# Patient Record
Sex: Female | Born: 1960
Health system: Southern US, Community
[De-identification: ages and names within clinical notes are randomized; demographics above are authoritative.]

## PROBLEM LIST (undated history)

## (undated) DIAGNOSIS — E663 Overweight: Secondary | ICD-10-CM

## (undated) DIAGNOSIS — M25519 Pain in unspecified shoulder: Secondary | ICD-10-CM

## (undated) DIAGNOSIS — M1990 Unspecified osteoarthritis, unspecified site: Secondary | ICD-10-CM

## (undated) DIAGNOSIS — K648 Other hemorrhoids: Secondary | ICD-10-CM

## (undated) DIAGNOSIS — R0602 Shortness of breath: Secondary | ICD-10-CM

## (undated) DIAGNOSIS — G4730 Sleep apnea, unspecified: Secondary | ICD-10-CM

## (undated) DIAGNOSIS — I2510 Atherosclerotic heart disease of native coronary artery without angina pectoris: Secondary | ICD-10-CM

## (undated) DIAGNOSIS — IMO0002 Reserved for concepts with insufficient information to code with codable children: Secondary | ICD-10-CM

## (undated) DIAGNOSIS — I10 Essential (primary) hypertension: Secondary | ICD-10-CM

## (undated) DIAGNOSIS — K219 Gastro-esophageal reflux disease without esophagitis: Secondary | ICD-10-CM

## (undated) DIAGNOSIS — Z7901 Long term (current) use of anticoagulants: Secondary | ICD-10-CM

## (undated) DIAGNOSIS — I509 Heart failure, unspecified: Secondary | ICD-10-CM

## (undated) DIAGNOSIS — R911 Solitary pulmonary nodule: Secondary | ICD-10-CM

## (undated) DIAGNOSIS — I219 Acute myocardial infarction, unspecified: Secondary | ICD-10-CM

## (undated) DIAGNOSIS — E785 Hyperlipidemia, unspecified: Secondary | ICD-10-CM

## (undated) DIAGNOSIS — R9430 Abnormal result of cardiovascular function study, unspecified: Secondary | ICD-10-CM

## (undated) DIAGNOSIS — C50919 Malignant neoplasm of unspecified site of unspecified female breast: Secondary | ICD-10-CM

## (undated) DIAGNOSIS — G4733 Obstructive sleep apnea (adult) (pediatric): Secondary | ICD-10-CM

## (undated) DIAGNOSIS — K469 Unspecified abdominal hernia without obstruction or gangrene: Secondary | ICD-10-CM

## (undated) DIAGNOSIS — I2699 Other pulmonary embolism without acute cor pulmonale: Secondary | ICD-10-CM

## (undated) DIAGNOSIS — Z86010 Personal history of colonic polyps: Secondary | ICD-10-CM

## (undated) DIAGNOSIS — K5730 Diverticulosis of large intestine without perforation or abscess without bleeding: Secondary | ICD-10-CM

## (undated) HISTORY — DX: Solitary pulmonary nodule: R91.1

## (undated) HISTORY — DX: Shortness of breath: R06.02

## (undated) HISTORY — DX: Diverticulosis of large intestine without perforation or abscess without bleeding: K57.30

## (undated) HISTORY — DX: Abnormal result of cardiovascular function study, unspecified: R94.30

## (undated) HISTORY — DX: Malignant neoplasm of unspecified site of unspecified female breast: C50.919

## (undated) HISTORY — DX: Pain in unspecified shoulder: M25.519

## (undated) HISTORY — DX: Overweight: E66.3

## (undated) HISTORY — PX: COLOSTOMY: SHX63

## (undated) HISTORY — DX: Long term (current) use of anticoagulants: Z79.01

## (undated) HISTORY — DX: Reserved for concepts with insufficient information to code with codable children: IMO0002

## (undated) HISTORY — DX: Unspecified abdominal hernia without obstruction or gangrene: K46.9

## (undated) HISTORY — PX: KNEE ARTHROSCOPY: SUR90

## (undated) HISTORY — DX: Obstructive sleep apnea (adult) (pediatric): G47.33

## (undated) HISTORY — DX: Unspecified osteoarthritis, unspecified site: M19.90

## (undated) HISTORY — PX: BREAST LUMPECTOMY: SHX2

## (undated) HISTORY — DX: Acute myocardial infarction, unspecified: I21.9

## (undated) HISTORY — DX: Other pulmonary embolism without acute cor pulmonale: I26.99

## (undated) HISTORY — DX: Hyperlipidemia, unspecified: E78.5

## (undated) HISTORY — DX: Sleep apnea, unspecified: G47.30

## (undated) HISTORY — DX: Atherosclerotic heart disease of native coronary artery without angina pectoris: I25.10

## (undated) HISTORY — DX: Essential (primary) hypertension: I10

## (undated) HISTORY — DX: Personal history of colonic polyps: Z86.010

## (undated) HISTORY — DX: Other hemorrhoids: K64.8

---

## 1998-07-21 ENCOUNTER — Emergency Department (HOSPITAL_COMMUNITY): Admission: EM | Admit: 1998-07-21 | Discharge: 1998-07-21 | Payer: Self-pay | Admitting: Emergency Medicine

## 2002-07-21 HISTORY — PX: ABDOMINAL HYSTERECTOMY: SHX81

## 2002-08-09 ENCOUNTER — Encounter (INDEPENDENT_AMBULATORY_CARE_PROVIDER_SITE_OTHER): Payer: Self-pay | Admitting: Specialist

## 2002-08-10 ENCOUNTER — Inpatient Hospital Stay (HOSPITAL_COMMUNITY): Admission: RE | Admit: 2002-08-10 | Discharge: 2002-08-11 | Payer: Self-pay | Admitting: *Deleted

## 2007-01-24 ENCOUNTER — Emergency Department (HOSPITAL_COMMUNITY): Admission: EM | Admit: 2007-01-24 | Discharge: 2007-01-25 | Payer: Self-pay | Admitting: Emergency Medicine

## 2008-10-27 ENCOUNTER — Emergency Department (HOSPITAL_COMMUNITY): Admission: EM | Admit: 2008-10-27 | Discharge: 2008-10-28 | Payer: Self-pay | Admitting: Emergency Medicine

## 2009-06-04 ENCOUNTER — Encounter (INDEPENDENT_AMBULATORY_CARE_PROVIDER_SITE_OTHER): Payer: Self-pay | Admitting: Dermatology

## 2009-06-04 ENCOUNTER — Ambulatory Visit: Payer: Self-pay | Admitting: Internal Medicine

## 2009-06-04 DIAGNOSIS — K439 Ventral hernia without obstruction or gangrene: Secondary | ICD-10-CM | POA: Insufficient documentation

## 2009-06-04 DIAGNOSIS — B354 Tinea corporis: Secondary | ICD-10-CM | POA: Insufficient documentation

## 2009-06-04 DIAGNOSIS — I10 Essential (primary) hypertension: Secondary | ICD-10-CM

## 2009-06-04 DIAGNOSIS — R635 Abnormal weight gain: Secondary | ICD-10-CM | POA: Insufficient documentation

## 2009-06-19 ENCOUNTER — Ambulatory Visit: Payer: Self-pay | Admitting: Internal Medicine

## 2009-06-20 LAB — CONVERTED CEMR LAB
ALT: 15 units/L (ref 0–35)
AST: 16 units/L (ref 0–37)
Albumin: 4.1 g/dL (ref 3.5–5.2)
Alkaline Phosphatase: 60 units/L (ref 39–117)
BUN: 17 mg/dL (ref 6–23)
CO2: 25 meq/L (ref 19–32)
Calcium: 9.5 mg/dL (ref 8.4–10.5)
Chloride: 104 meq/L (ref 96–112)
Creatinine, Ser: 0.61 mg/dL (ref 0.40–1.20)
Glucose, Bld: 94 mg/dL (ref 70–99)
HCT: 41.8 % (ref 36.0–46.0)
Hemoglobin: 13.3 g/dL (ref 12.0–15.0)
MCHC: 31.8 g/dL (ref 30.0–36.0)
MCV: 86.2 fL (ref >=78.0)
Platelets: 274 10*3/uL (ref 150–400)
Potassium: 4.8 meq/L (ref 3.5–5.3)
RBC: 4.85 M/uL (ref 3.87–5.11)
RDW: 15.1 % (ref 11.5–15.5)
Sodium: 142 meq/L (ref 135–145)
TSH: 1.183 microintl units/mL (ref 0.350–4.5)
Total Bilirubin: 0.3 mg/dL (ref 0.3–1.2)
Total Protein: 7.6 g/dL (ref 6.0–8.3)
WBC: 6.6 10*3/uL (ref 4.0–10.5)

## 2009-06-29 DIAGNOSIS — G4733 Obstructive sleep apnea (adult) (pediatric): Secondary | ICD-10-CM

## 2009-07-17 ENCOUNTER — Ambulatory Visit (HOSPITAL_BASED_OUTPATIENT_CLINIC_OR_DEPARTMENT_OTHER): Admission: RE | Admit: 2009-07-17 | Discharge: 2009-07-17 | Payer: Self-pay | Admitting: Dermatology

## 2009-07-17 ENCOUNTER — Encounter (INDEPENDENT_AMBULATORY_CARE_PROVIDER_SITE_OTHER): Payer: Self-pay | Admitting: Dermatology

## 2009-07-21 ENCOUNTER — Ambulatory Visit: Payer: Self-pay | Admitting: Internal Medicine

## 2009-09-19 ENCOUNTER — Ambulatory Visit: Payer: Self-pay | Admitting: Infectious Diseases

## 2009-09-21 ENCOUNTER — Telehealth: Payer: Self-pay | Admitting: Licensed Clinical Social Worker

## 2009-09-21 ENCOUNTER — Encounter: Payer: Self-pay | Admitting: Licensed Clinical Social Worker

## 2009-09-24 ENCOUNTER — Encounter (INDEPENDENT_AMBULATORY_CARE_PROVIDER_SITE_OTHER): Payer: Self-pay | Admitting: Dermatology

## 2009-09-25 ENCOUNTER — Encounter (INDEPENDENT_AMBULATORY_CARE_PROVIDER_SITE_OTHER): Payer: Self-pay | Admitting: Dermatology

## 2009-09-25 ENCOUNTER — Encounter: Payer: Self-pay | Admitting: Internal Medicine

## 2009-09-25 ENCOUNTER — Ambulatory Visit (HOSPITAL_COMMUNITY): Admission: RE | Admit: 2009-09-25 | Discharge: 2009-09-25 | Payer: Self-pay | Admitting: Internal Medicine

## 2009-09-25 ENCOUNTER — Ambulatory Visit: Payer: Self-pay | Admitting: Internal Medicine

## 2009-10-02 ENCOUNTER — Ambulatory Visit (HOSPITAL_COMMUNITY): Admission: RE | Admit: 2009-10-02 | Discharge: 2009-10-02 | Payer: Self-pay | Admitting: Internal Medicine

## 2009-10-02 LAB — HM MAMMOGRAPHY: HM Mammogram: NEGATIVE

## 2009-10-03 ENCOUNTER — Ambulatory Visit: Payer: Self-pay | Admitting: Internal Medicine

## 2009-10-03 LAB — CONVERTED CEMR LAB
ALT: 16 units/L (ref 0–35)
AST: 14 units/L (ref 0–37)
Albumin: 4.3 g/dL (ref 3.5–5.2)
Alkaline Phosphatase: 65 units/L (ref 39–117)
BUN: 11 mg/dL (ref 6–23)
CO2: 30 meq/L (ref 19–32)
Calcium: 9.3 mg/dL (ref 8.4–10.5)
Chloride: 100 meq/L (ref 96–112)
Cholesterol: 212 mg/dL — ABNORMAL HIGH (ref 0–200)
Creatinine, Ser: 0.7 mg/dL (ref 0.40–1.20)
Glucose, Bld: 97 mg/dL (ref 70–99)
HDL: 46 mg/dL (ref >39)
LDL Cholesterol: 144 mg/dL — ABNORMAL HIGH (ref 0–99)
Potassium: 4.4 meq/L (ref 3.5–5.3)
Sodium: 138 meq/L (ref 135–145)
Total Bilirubin: 0.4 mg/dL (ref 0.3–1.2)
Total CHOL/HDL Ratio: 4.6
Total Protein: 7.8 g/dL (ref 6.0–8.3)
Triglycerides: 111 mg/dL (ref <150)
VLDL: 22 mg/dL (ref 0–40)

## 2009-10-04 ENCOUNTER — Telehealth: Payer: Self-pay | Admitting: *Deleted

## 2009-10-05 ENCOUNTER — Telehealth (INDEPENDENT_AMBULATORY_CARE_PROVIDER_SITE_OTHER): Payer: Self-pay | Admitting: Dermatology

## 2009-10-16 ENCOUNTER — Encounter: Payer: Self-pay | Admitting: Internal Medicine

## 2009-10-19 ENCOUNTER — Telehealth (INDEPENDENT_AMBULATORY_CARE_PROVIDER_SITE_OTHER): Payer: Self-pay | Admitting: Dermatology

## 2009-10-22 ENCOUNTER — Encounter (INDEPENDENT_AMBULATORY_CARE_PROVIDER_SITE_OTHER): Payer: Self-pay | Admitting: *Deleted

## 2009-10-22 ENCOUNTER — Ambulatory Visit: Payer: Self-pay | Admitting: Gastroenterology

## 2009-10-23 ENCOUNTER — Encounter: Payer: Self-pay | Admitting: Internal Medicine

## 2009-10-31 ENCOUNTER — Ambulatory Visit: Payer: Self-pay | Admitting: Gastroenterology

## 2009-10-31 LAB — HM COLONOSCOPY

## 2009-11-28 ENCOUNTER — Ambulatory Visit: Payer: Self-pay | Admitting: Internal Medicine

## 2009-12-19 DIAGNOSIS — R911 Solitary pulmonary nodule: Secondary | ICD-10-CM

## 2009-12-19 HISTORY — DX: Solitary pulmonary nodule: R91.1

## 2010-01-15 ENCOUNTER — Telehealth: Payer: Self-pay | Admitting: *Deleted

## 2010-01-16 ENCOUNTER — Ambulatory Visit: Payer: Self-pay | Admitting: Diagnostic Radiology

## 2010-01-16 ENCOUNTER — Encounter: Payer: Self-pay | Admitting: Emergency Medicine

## 2010-01-16 ENCOUNTER — Inpatient Hospital Stay (HOSPITAL_COMMUNITY): Admission: AD | Admit: 2010-01-16 | Discharge: 2010-01-17 | Payer: Self-pay | Admitting: Internal Medicine

## 2010-01-16 ENCOUNTER — Encounter: Payer: Self-pay | Admitting: Internal Medicine

## 2010-01-16 DIAGNOSIS — I2699 Other pulmonary embolism without acute cor pulmonale: Secondary | ICD-10-CM

## 2010-01-16 HISTORY — DX: Other pulmonary embolism without acute cor pulmonale: I26.99

## 2010-01-17 ENCOUNTER — Ambulatory Visit: Payer: Self-pay | Admitting: Internal Medicine

## 2010-01-22 ENCOUNTER — Ambulatory Visit: Payer: Self-pay | Admitting: Internal Medicine

## 2010-01-22 LAB — CONVERTED CEMR LAB: INR: 2

## 2010-01-28 ENCOUNTER — Ambulatory Visit: Payer: Self-pay | Admitting: Internal Medicine

## 2010-01-28 LAB — CONVERTED CEMR LAB: INR: 2.5

## 2010-02-04 ENCOUNTER — Ambulatory Visit: Payer: Self-pay | Admitting: Internal Medicine

## 2010-02-04 ENCOUNTER — Encounter: Payer: Self-pay | Admitting: Internal Medicine

## 2010-02-04 ENCOUNTER — Ambulatory Visit (HOSPITAL_COMMUNITY): Admission: RE | Admit: 2010-02-04 | Discharge: 2010-02-04 | Payer: Self-pay | Admitting: Internal Medicine

## 2010-02-04 DIAGNOSIS — R0602 Shortness of breath: Secondary | ICD-10-CM

## 2010-02-04 DIAGNOSIS — M25519 Pain in unspecified shoulder: Secondary | ICD-10-CM

## 2010-02-06 ENCOUNTER — Encounter: Payer: Self-pay | Admitting: Cardiology

## 2010-02-07 ENCOUNTER — Ambulatory Visit: Payer: Self-pay | Admitting: Cardiology

## 2010-02-11 ENCOUNTER — Ambulatory Visit: Payer: Self-pay | Admitting: Family Medicine

## 2010-02-11 DIAGNOSIS — M75100 Unspecified rotator cuff tear or rupture of unspecified shoulder, not specified as traumatic: Secondary | ICD-10-CM | POA: Insufficient documentation

## 2010-02-15 ENCOUNTER — Telehealth: Payer: Self-pay | Admitting: Internal Medicine

## 2010-02-18 ENCOUNTER — Ambulatory Visit: Payer: Self-pay | Admitting: Internal Medicine

## 2010-02-18 LAB — CONVERTED CEMR LAB: INR: 3.8

## 2010-02-22 ENCOUNTER — Ambulatory Visit: Payer: Self-pay | Admitting: Family Medicine

## 2010-02-22 DIAGNOSIS — M722 Plantar fascial fibromatosis: Secondary | ICD-10-CM | POA: Insufficient documentation

## 2010-02-25 ENCOUNTER — Ambulatory Visit: Payer: Self-pay | Admitting: Internal Medicine

## 2010-02-25 DIAGNOSIS — R252 Cramp and spasm: Secondary | ICD-10-CM

## 2010-02-25 LAB — CONVERTED CEMR LAB
BUN: 28 mg/dL — ABNORMAL HIGH (ref 6–23)
CO2: 26 meq/L (ref 19–32)
Calcium Ionized: 1.27 mmol/L (ref 1.12–1.32)
Chloride: 101 meq/L (ref 96–112)
Creatinine, Ser: 0.99 mg/dL (ref 0.40–1.20)
HCT: 42.9 % (ref 36.0–46.0)
Hemoglobin: 13.6 g/dL (ref 12.0–15.0)
Platelets: 329 10*3/uL (ref 150–400)
Potassium: 4.7 meq/L (ref 3.5–5.3)
TSH: 0.767 microintl units/mL (ref 0.350–4.5)
WBC: 7.1 10*3/uL (ref 4.0–10.5)

## 2010-03-04 ENCOUNTER — Ambulatory Visit: Payer: Self-pay | Admitting: Internal Medicine

## 2010-03-04 LAB — CONVERTED CEMR LAB

## 2010-03-07 ENCOUNTER — Ambulatory Visit: Payer: Self-pay | Admitting: Internal Medicine

## 2010-03-07 ENCOUNTER — Telehealth: Payer: Self-pay | Admitting: *Deleted

## 2010-03-07 DIAGNOSIS — H5310 Unspecified subjective visual disturbances: Secondary | ICD-10-CM | POA: Insufficient documentation

## 2010-03-18 ENCOUNTER — Ambulatory Visit: Payer: Self-pay | Admitting: Internal Medicine

## 2010-03-18 LAB — CONVERTED CEMR LAB: INR: 4.2

## 2010-03-28 ENCOUNTER — Ambulatory Visit: Payer: Self-pay | Admitting: Internal Medicine

## 2010-03-28 LAB — CONVERTED CEMR LAB

## 2010-03-29 ENCOUNTER — Encounter: Payer: Self-pay | Admitting: Cardiology

## 2010-04-01 ENCOUNTER — Ambulatory Visit: Payer: Self-pay | Admitting: Family Medicine

## 2010-04-01 ENCOUNTER — Ambulatory Visit: Payer: Self-pay | Admitting: Cardiology

## 2010-04-04 ENCOUNTER — Telehealth: Payer: Self-pay | Admitting: Cardiology

## 2010-04-15 ENCOUNTER — Ambulatory Visit: Payer: Self-pay | Admitting: Internal Medicine

## 2010-04-15 ENCOUNTER — Encounter
Admission: RE | Admit: 2010-04-15 | Discharge: 2010-06-24 | Payer: Self-pay | Source: Home / Self Care | Admitting: Internal Medicine

## 2010-04-15 LAB — CONVERTED CEMR LAB: INR: 2.4

## 2010-05-06 ENCOUNTER — Ambulatory Visit: Payer: Self-pay | Admitting: Internal Medicine

## 2010-05-27 ENCOUNTER — Ambulatory Visit: Payer: Self-pay | Admitting: Internal Medicine

## 2010-05-27 LAB — CONVERTED CEMR LAB: INR: 2.4

## 2010-06-25 ENCOUNTER — Ambulatory Visit: Payer: Self-pay | Admitting: Internal Medicine

## 2010-06-25 DIAGNOSIS — K089 Disorder of teeth and supporting structures, unspecified: Secondary | ICD-10-CM | POA: Insufficient documentation

## 2010-06-25 LAB — CONVERTED CEMR LAB: INR: 2.3

## 2010-07-05 ENCOUNTER — Ambulatory Visit: Payer: Self-pay | Admitting: Internal Medicine

## 2010-07-08 ENCOUNTER — Encounter: Payer: Self-pay | Admitting: Internal Medicine

## 2010-07-22 ENCOUNTER — Ambulatory Visit: Payer: Self-pay

## 2010-07-29 ENCOUNTER — Ambulatory Visit: Admission: RE | Admit: 2010-07-29 | Discharge: 2010-07-29 | Payer: Self-pay | Source: Home / Self Care

## 2010-08-09 DIAGNOSIS — I2699 Other pulmonary embolism without acute cor pulmonale: Secondary | ICD-10-CM | POA: Insufficient documentation

## 2010-08-09 DIAGNOSIS — Z7901 Long term (current) use of anticoagulants: Secondary | ICD-10-CM | POA: Insufficient documentation

## 2010-08-13 ENCOUNTER — Encounter: Payer: Self-pay | Admitting: Internal Medicine

## 2010-08-13 ENCOUNTER — Telehealth: Payer: Self-pay | Admitting: *Deleted

## 2010-08-20 NOTE — Assessment & Plan Note (Signed)
Summary: ACUTE/PATEL/DENTAL REFERRAL/CH   Vital Signs:  Patient profile:   50 year old female Height:      65.5 inches (166.37 cm) Weight:      269.0 pounds (122.27 kg) BMI:     44.24 Temp:     97.1 degrees F (36.17 degrees C) oral Pulse rate:   80 / minute BP sitting:   127 / 76  (left arm)  Vitals Entered By: Stanton Kidney Ditzler RN (June 25, 2010 11:46 AM) Is Patient Diabetic? No Pain Assessment Patient in pain? yes     Location: teeth Intensity: 5-9 Type: aches Onset of pain  past 2 weeks Nutritional Status BMI of > 30 = obese Nutritional Status Detail appetite good  Have you ever been in a relationship where you felt threatened, hurt or afraid?denies   Does patient need assistance? Functional Status Self care Ambulation Normal Comments Dental pain past 2 weeks - needs referral dental clinic.   Primary Care Provider:  Lyn Hollingshead   History of Present Illness: 50 yo woman with PMH of PE, OSA, HTN presents for dental pain.  She has a toothache on upper molar x 2 weeks which has been getting worst.  She tried gargling with salt and peroxide but it is not helping.   Denies any fever, N/V.  She had a slight headache this morning but now she is ok.  She does not need any pain medication at this moment and she knows that she cannot take a lot of pain med because she is on Coumadin for her PE.   Depression History:      The patient denies a depressed mood most of the day and a diminished interest in her usual daily activities.         Preventive Screening-Counseling & Management  Alcohol-Tobacco     Alcohol drinks/day: 0     Smoking Status: quit     Year Quit: 10 YEARS AGO  Caffeine-Diet-Exercise     Does Patient Exercise: no     Type of exercise: will start water aerobics  Allergies: 1)  ! Asa  Review of Systems       toothache  Physical Exam  General:  alert, well-developed, well-nourished, and well-hydrated.   Mouth:  right upper molar # 3 with tenderness,  no erythema or drainage seen.   Right lower tooth # 28- cavity Lungs:  normal respiratory effort, no intercostal retractions, no accessory muscle use, normal breath sounds, no crackles, and no wheezes.   Heart:  normal rate, regular rhythm, no murmur, no gallop, no rub, and no JVD.   Abdomen:  soft, non-tender, normal bowel sounds, no distention, no masses, and no guarding.   Pulses:  R and L carotid,radial,femoral,dorsalis pedis and posterior tibial pulses are full and equal bilaterally Extremities:  No clubbing, cyanosis, edema, or deformity noted with normal full range of motion of all joints.   Neurologic:  alert & oriented X3, cranial nerves II-XII intact, strength normal in all extremities, and sensation intact to light touch.     Impression & Recommendations:  Problem # 1:  DENTAL PAIN (ICD-525.9) Toothache on #3 of right upper molar and #28 on right lower jaw.  Will refer patient to dentist for further evaluation and treatment. I also gave her a prescription of Amoxicillin 500mg  three times a day x 3 days and instructed her to take the abx if her pain get worse and if she cannot be seen by the dentist in 1-2 weeks.  I  also told her to call and inform me to let me know if she's taking the Amoxicillin because she would need to get her INR check sooner than her january appointment with Dr. Alexandria Lodge since the interaction between Coumadin and Amoxicillin include: "may increase INR and risk of bleeding."  Her INR on 05/2010 was 1.9, being followed by Dr. Alexandria Lodge.  Orders: Dental Referral (Dentist)  Problem # 2:  ESSENTIAL HYPERTENSION, BENIGN (ICD-401.1) Well-controlled BP 127/76.  There is confusion to which dosage  of Amlodipine patient is taking.  She will start taking Amlodipine 5mg  1 tablet once daily and then follow up with Dr. Anselm Jungling in 10-14 days to recheck her BP.    Her updated medication list for this problem includes:    Lisinopril-hydrochlorothiazide 20-12.5 Mg Tabs  (Lisinopril-hydrochlorothiazide) .Marland Kitchen... Take 1 tablet by mouth every morning    Amlodipine Besylate 5 Mg Tabs (Amlodipine besylate) .Marland Kitchen... Take 1 tablet by mouth once daily  Complete Medication List: 1)  Lisinopril-hydrochlorothiazide 20-12.5 Mg Tabs (Lisinopril-hydrochlorothiazide) .... Take 1 tablet by mouth every morning 2)  Warfarin Sodium 10 Mg Tabs (Warfarin sodium) .... Tablet strength: 10mg  take as directed 3)  Amlodipine Besylate 5 Mg Tabs (Amlodipine besylate) .... Take 1 tablet by mouth once daily 4)  Amoxicillin 500 Mg Tabs (Amoxicillin) .Marland Kitchen.. 1 tablet by mouth three times a day x 3 days  Patient Instructions: 1)  Please follow up with Dr. Anselm Jungling in 10-14 days 2)  Please bring your medication bottle next office visit 3)  Take Lisinopril/HCTZ 1 tablet by mouth once daily  4)  Take Amlodipine 5mg  1 tablet by mouth once daily  5)  If you decide to take Amoxicillin, please come back in for an INR check because it can increase risk of bleeding when you are taking it with Coumadin. Prescriptions: AMOXICILLIN 500 MG TABS (AMOXICILLIN) 1 tablet by mouth three times a day x 3 days  #9 x 0   Entered and Authorized by:   Rosana Berger MD   Signed by:   Rosana Berger MD on 06/25/2010   Method used:   Print then Give to Patient   RxID:   [id number] AMLODIPINE BESYLATE 5 MG TABS (AMLODIPINE BESYLATE) take 1 tablet by mouth once daily  #30 x 11   Entered and Authorized by:   Rosana Berger MD   Signed by:   Rosana Berger MD on 06/25/2010   Method used:   Electronically to        [facility] (retail)       [address]       [place], [place]  [id number]       Ph: [phone]       Fax: [phone]   RxID:   [id number]    Orders Added: 1)  Dental Referral [Dentist] 2)  Est. Patient Level III [42683]     Prevention & Chronic Care Immunizations   Influenza vaccine: Not documented   Influenza vaccine deferral: Refused  (06/19/2009)    Tetanus booster:  07/22/2003: historical from records given    Pneumococcal vaccine: Not documented  Colorectal Screening   Hemoccult: Not documented   Hemoccult action/deferral: Deferred  (06/19/2009)    Colonoscopy: DONE  (10/31/2009)   Colonoscopy action/deferral: GI referral  (09/19/2009)   Colonoscopy due: 10/2019  Other Screening   Pap smear: Not documented   Pap smear action/deferral: Not indicated S/P hysterectomy  (06/19/2009)    Mammogram: ASSESSMENT: Negative - BI-RADS 1^MM DIGITAL SCREENING  (10/02/2009)  Mammogram action/deferral: Ordered  (09/19/2009)   Smoking status: quit  (06/25/2010)  Lipids   Total Cholesterol: 212  (10/03/2009)   LDL: 144  (10/03/2009)   LDL Direct: Not documented   HDL: 46  (10/03/2009)   Triglycerides: 111  (10/03/2009)  Hypertension   Last Blood Pressure: 127 / 76  (06/25/2010)   Serum creatinine: 0.99  (02/25/2010)   Serum potassium 4.7  (02/25/2010)  Self-Management Support :   Personal Goals (by the next clinic visit) :      Personal blood pressure goal: 140/90  (10/03/2009)   Patient will work on the following items until the next clinic visit to reach self-care goals:     Medications and monitoring: take my medicines every day, check my blood pressure, bring all of my medications to every visit  (06/25/2010)     Eating: eat more vegetables, use fresh or frozen vegetables, eat baked foods instead of fried foods, eat fruit for snacks and desserts, limit or avoid alcohol  (06/25/2010)     Activity: take a 30 minute walk every day  (06/25/2010)     Other: watching greens since on Coumadin- joined [employer]  (02/25/2010)    Hypertension self-management support: Written self-care plan, Education handout, Resources for patients handout  (06/25/2010)   Hypertension self-care plan printed.   Hypertension education handout printed      Resource handout printed.

## 2010-08-20 NOTE — Assessment & Plan Note (Signed)
Summary: COU/CH  Anticoagulant Therapy PCP: Aris Lot MD [facility] Attending: Margarito Liner MD Indication 1: Pulmonary  embolus Indication 2: Encounter for therapeutic drug monitoring  V58.83 Start date: 01/16/2010 Duration: 6 months  Patient Assessment Reviewed by: Chancy Milroy PharmD  January 28, 2010 Medication review: verified warfarin dosage & schedule,verified previous prescription medications, verified doses & any changes, verified new medications, reviewed OTC medications, reviewed OTC health products-vitamins supplements etc Complications: none Dietary changes: none   Health status changes: none   Lifestyle changes: none   Recent/future hospitalizations: none   Recent/future procedures: none   Recent/future dental: none Patient Assessment Part 2:  Have you MISSED ANY DOSES or CHANGED TABLETS?  No missed Warfarin doses or changed tablets.  Have you had any BRUISING or BLEEDING ( nose or gum bleeds,blood in urine or stool)?  No reported bruising or bleeding in nose, gums, urine, stool.  Have you STARTED or STOPPED any MEDICATIONS, including OTC meds,herbals or supplements?  No other medications or herbal supplements were started or stopped.  Have you CHANGED your DIET, especially green vegetables,or ALCOHOL intake?  No changes in diet or alcohol intake.  Have you had any ILLNESSES or HOSPITALIZATIONS?  No reported illnesses or hospitalizations  Have you had any signs of CLOTTING?(chest discomfort,dizziness,shortness of breath,arms tingling,slurred speech,swelling or redness in leg)    No chest discomfort, dizziness, shortness of breath, tingling in arm, slurred speech, swelling, or redness in leg.     Treatment  Target INR: 2.0-3.0 INR: 2.5  Date: 01/28/2010 Regimen In:  60mg /6 days INR reflects regimen in: 2.5  New  Tablet strength: : 10mg  Regimen Out:     Sunday: 1 Tablet     Monday: 1/2 Tablet     Tuesday: 1 Tablet     Wednesday: 1 Tablet     Thursday: 1/2  Tablet      Friday: 1 Tablet     Saturday: 1 Tablet Total Weekly: 60.0mg /week mg  Next INR Due: 02/04/2010 Adjusted by: Barbera Setters. Alexandria Lodge III PharmD CACP   Return to anticoagulation clinic:  02/04/2010 Time of next visit: 1015    Allergies: 1)  ! Jonne Ply

## 2010-08-20 NOTE — Assessment & Plan Note (Signed)
Summary: COU/CH  Anticoagulant Therapy PCP: Lyn Hollingshead [facility] Attending: Coralee Pesa MD, Joen Laura  Indication 1: Pulmonary  embolus Indication 2: Encounter for therapeutic drug monitoring  V58.83 Start date: 01/16/2010 Duration: 6 months  Patient Assessment Reviewed by: Chancy Milroy PharmD  February 18, 2010 Medication review: verified warfarin dosage & schedule,verified previous prescription medications, verified doses & any changes, verified new medications, reviewed OTC medications, reviewed OTC health products-vitamins supplements etc Complications: none Dietary changes: none   Health status changes: none   Lifestyle changes: none   Recent/future hospitalizations: none   Recent/future procedures: none   Recent/future dental: none Patient Assessment Part 2:  Have you MISSED ANY DOSES or CHANGED TABLETS?  No missed Warfarin doses or changed tablets.  Have you had any BRUISING or BLEEDING ( nose or gum bleeds,blood in urine or stool)?  No reported bruising or bleeding in nose, gums, urine, stool.  Have you STARTED or STOPPED any MEDICATIONS, including OTC meds,herbals or supplements?  No other medications or herbal supplements were started or stopped.  Have you CHANGED your DIET, especially green vegetables,or ALCOHOL intake?  No changes in diet or alcohol intake.  Have you had any ILLNESSES or HOSPITALIZATIONS?  No reported illnesses or hospitalizations  Have you had any signs of CLOTTING?(chest discomfort,dizziness,shortness of breath,arms tingling,slurred speech,swelling or redness in leg)    No chest discomfort, dizziness, shortness of breath, tingling in arm, slurred speech, swelling, or redness in leg.     Treatment  Target INR: 2.0-3.0 INR: 3.8  Date: 02/18/2010 Regimen In:  55.0mg /week INR reflects regimen in: 3.8  New  Tablet strength: : 10mg  Regimen Out:     Sunday: 1/2 Tablet     Monday: 1 Tablet     Tuesday: 1/2 Tablet     Wednesday: 1 Tablet     Thursday: 1/2  Tablet      Friday: 1 Tablet     Saturday: 1/2 Tablet Total Weekly: 50.0mg /week mg  Next INR Due: 03/04/2010 Adjusted by: Barbera Setters. Alexandria Lodge III PharmD CACP   Return to anticoagulation clinic:  02/25/2010 Time of next visit: 1045    Allergies: 1)  ! Jonne Ply

## 2010-08-20 NOTE — Assessment & Plan Note (Signed)
Summary: COU/CH  Anticoagulant Therapy PCP: Lyn Hollingshead [facility] Attending: Coralee Pesa MD, Levada Schilling Indication 1: Pulmonary  embolus Indication 2: Encounter for therapeutic drug monitoring  V58.83 Start date: 01/16/2010 Duration: 6 months  Patient Assessment Reviewed by: Chancy Milroy PharmD  March 04, 2010 Medication review: verified warfarin dosage & schedule,verified previous prescription medications, verified doses & any changes, verified new medications, reviewed OTC medications, reviewed OTC health products-vitamins supplements etc Complications: none Dietary changes: none   Health status changes: none   Lifestyle changes: none   Recent/future hospitalizations: none   Recent/future procedures: none   Recent/future dental: none Patient Assessment Part 2:  Have you MISSED ANY DOSES or CHANGED TABLETS?  No missed Warfarin doses or changed tablets.  Have you had any BRUISING or BLEEDING ( nose or gum bleeds,blood in urine or stool)?  No reported bruising or bleeding in nose, gums, urine, stool.  Have you STARTED or STOPPED any MEDICATIONS, including OTC meds,herbals or supplements?  No other medications or herbal supplements were started or stopped.  Have you CHANGED your DIET, especially green vegetables,or ALCOHOL intake?  No changes in diet or alcohol intake.  Have you had any ILLNESSES or HOSPITALIZATIONS?  No reported illnesses or hospitalizations  Have you had any signs of CLOTTING?(chest discomfort,dizziness,shortness of breath,arms tingling,slurred speech,swelling or redness in leg)    No chest discomfort, dizziness, shortness of breath, tingling in arm, slurred speech, swelling, or redness in leg.     Treatment  Target INR: 2.0-3.0 INR: 2.9  Date: 03/04/2010 Regimen In:  50.0mg /week INR reflects regimen in: 2.9  New  Tablet strength: : 10mg  Regimen Out:     Sunday: 1/2 Tablet     Monday: 1 Tablet     Tuesday: 1/2 Tablet     Wednesday: 1/2 Tablet     Thursday: 1  Tablet      Friday: 1/2 Tablet     Saturday: 1/2 Tablet Total Weekly: 45.0mg /week mg  Next INR Due: 03/18/2010 Adjusted by: Barbera Setters. Alexandria Lodge III PharmD CACP   Return to anticoagulation clinic:  03/18/2010 Time of next visit: 1000    Allergies: 1)  ! Jonne Ply

## 2010-08-20 NOTE — Assessment & Plan Note (Signed)
Summary: extreme makeover: lifestyle class/dmr   Allergies: 1)  ! Asa  Patient attended a 1 hour [employer]: Lifestyle meeting today. The meeting included information about: cooking- a food demo, healthy eating (portion control, lower and healthy fat,  high fiber and more fruits and vegetables), weight loss tips, and how to start a walking program and how to use a pedometer to track walking progress .   Please ask patient what they learned at their next visit.   Thank you for the referral.
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
## 2010-08-20 NOTE — Assessment & Plan Note (Signed)
Summary: COU/CH  Anticoagulant Therapy Managed by: Barbera Setters. Janie Morning  PharmD CACP PCP: Lyn Hollingshead [facility] Attending: Darl Pikes, Beth Indication 1: Pulmonary  embolus Indication 2: Encounter for therapeutic drug monitoring  V58.83 Start date: 01/16/2010 Duration: 6 months  Patient Assessment Reviewed by: Chancy Milroy PharmD  May 27, 2010 Medication review: verified warfarin dosage & schedule,verified previous prescription medications, verified doses & any changes, verified new medications, reviewed OTC medications, reviewed OTC health products-vitamins supplements etc Complications: none Dietary changes: none   Health status changes: none   Lifestyle changes: none   Recent/future hospitalizations: none   Recent/future procedures: none   Recent/future dental: none Patient Assessment Part 2:  Have you MISSED ANY DOSES or CHANGED TABLETS?  No missed Warfarin doses or changed tablets.  Have you had any BRUISING or BLEEDING ( nose or gum bleeds,blood in urine or stool)?  No reported bruising or bleeding in nose, gums, urine, stool.  Have you STARTED or STOPPED any MEDICATIONS, including OTC meds,herbals or supplements?  No other medications or herbal supplements were started or stopped.  Have you CHANGED your DIET, especially green vegetables,or ALCOHOL intake?  No changes in diet or alcohol intake.  Have you had any ILLNESSES or HOSPITALIZATIONS?  No reported illnesses or hospitalizations  Have you had any signs of CLOTTING?(chest discomfort,dizziness,shortness of breath,arms tingling,slurred speech,swelling or redness in leg)    No chest discomfort, dizziness, shortness of breath, tingling in arm, slurred speech, swelling, or redness in leg.     Treatment  Target INR: 2.0-3.0 INR: 2.4  Date: 05/27/2010 Regimen In:  50.0mg /week INR reflects regimen in: 2.4  New  Tablet strength: : 10mg  Regimen Out:     Sunday: 1/2 Tablet     Monday: 1 Tablet     Tuesday: 1/2  Tablet     Wednesday: 1 Tablet     Thursday: 1/2 Tablet      Friday: 1 Tablet     Saturday: 1/2 Tablet Total Weekly: 50.0mg /week mg  Next INR Due: 06/24/2010 Adjusted by: Barbera Setters. Alexandria Lodge III PharmD CACP   Return to anticoagulation clinic:  06/24/2010 Time of next visit: 1115    Allergies: 1)  ! Jonne Ply

## 2010-08-20 NOTE — Assessment & Plan Note (Signed)
Summary: np6/dx:chest perssure/SOB/post TEE/lg   Visit Type:  Initial Consult Primary Provider:  Lyn Hollingshead  CC:  shortness of breath.  History of Present Illness: The patient is referred for evaluation of shortness of breath.  She was admitted to the hospital with a pulmonary embolus on January 16, 2010.  She's been treated with Coumadin.  She is significantly overweight.  Since that time she's had continued shortness of breath.  Some of it is with exertion.  She is referred for further cardiac evaluation.  There is no proven history of coronary disease.  She does have obstructive sleep apnea.  There is a history of hypertension.  Patient also has some right-sided sharp chest pain that is intermittent.  Preventive Screening-Counseling & Management  Alcohol-Tobacco     Smoking Status: quit  Caffeine-Diet-Exercise     Does Patient Exercise: no      Drug Use:  no.    Current Medications (verified): 1)  Lisinopril-Hydrochlorothiazide 20-25 Mg Tabs (Lisinopril-Hydrochlorothiazide) .... Take 1 Tablet By Mouth Once A Day 2)  Warfarin Sodium 10 Mg Tabs (Warfarin Sodium) .... Tablet Strength: 10mg  Take As Directed 3)  Amlodipine Besylate 10 Mg Tabs (Amlodipine Besylate) .... Take 1 Tablet By Mouth Once A Day  Allergies (verified): 1)  ! Jonne Ply  Past History:  Family History: Last updated: 09/19/2009 Mother: DM Dad: DM Older sister: DM No family members have had stroke, MI, cancer per her report.   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  Not married; single. Through 10th grade in high school. Some reading skills. Can write. Going to [employer] now to work on reading skills.  Tobacco Use - Former.  Alcohol Use - no Regular Exercise - no Drug Use - no  Past Medical History: Shoulder pain Shortness of breath Pulmonary embolus   01/16/2010..treat with  coumadin OSA Abdominal hernia Hypertension Overweight Lung nodule  LUL...by CT  12/2009...needs one year F/U Coumadin Rx.  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  Not married; single. Through 10th grade in high school. Some reading skills. Can write. Going to [employer] now to work on reading skills.  Tobacco Use - Former.  Alcohol Use - no Regular Exercise - no Drug Use - no Drug Use:  no  Review of Systems       Patient denies fever, chills, headache, sweats, rash, change in vision, change in hearing, cough, nausea vomiting, urinary symptoms.  All other systems are reviewed and are negative.  Vital Signs:  Patient profile:   50 year old female Height:      67 inches Weight:      283 pounds BMI:     44.48 Pulse rate:   85 / minute BP sitting:   142 / 78  (left arm) Cuff size:   large  Vitals Entered By: Hardin Negus, RMA (February 07, 2010 2:56 PM)  Physical Exam  General:  Patient is markedly overweight. Head:  head is atraumatic. Eyes:  no xanthelasma. Neck:  no jugular venous distention. Chest Wall:  no chest wall tenderness. Lungs:  lungs are clear.  Respiratory effort is nonlabored. Heart:  cardiac exam reveals an S1-S2.  No clicks or significant murmurs. Abdomen:  abdomen is obese. Msk:  no musculoskeletal deformities. Extremities:  no peripheral  edema. Skin:  no skin rashes. Psych:  patient is oriented to person time and place.  Affect is normal.   Impression & Recommendations:  Problem # 1:  COUMADIN THERAPY (ICD-V58.61) The patient is on Coumadin and of course this needs to be continued.  Problem # 2:  SHOULDER PAIN, RIGHT (ICD-719.41) At this point there is no proof that the shoulder pain is ischemic in origin.  We will consider exercise testing at a later date.  Problem # 3:  PE (ICD-415.19)  Her updated medication list for this problem  includes:    Warfarin Sodium 10 Mg Tabs (Warfarin sodium) .Marland Kitchen... Tablet strength: 10mg  take as directed The etiology of the pulmonary embolus is not clear.  She will need to remain on Coumadin indefinitely.  Problem # 4:  WEIGHT GAIN (ICD-783.1) patient's weight certainly plays a role with her shortness of breath.  Problem # 5:  ESSENTIAL HYPERTENSION, BENIGN (ICD-401.1) The patient is on multiple medications for her hypertension.  Blood pressure is reasonably controlled today.  Problem # 6:  * LUNG NODULE O. nodule was noted on her CT scan.  There was recommendation for followup in one year.  Problem # 7:  * VIGOROUS LV FUNCTION I have personally reviewed the images of the patient's echo that was done when she was in the hospital.  A right heart was not dilated.  There is moderate left ventricular hypertrophy and there was vigorous or hyperdynamic LV function.  There was a small intracavitary gradient in the left ventricle.  I wonder if some of her symptoms may be obstructive in origin.  There was no documented left ventricular outflow tract obstruction.  Her resting heart rate is mildly increased.  I will let a beta blocker to her medicines to see if this helps her symptomatically.  When I see her back for followup we will consider further testing.  Other Orders: EKG w/ Interpretation (93000)  Patient Instructions: 1)  Start Metoprolol ER 50mg  1/2 tab daily for 1 week then increase to 1 tab daily. 2)  Follow up in 6 weeks Prescriptions: METOPROLOL SUCCINATE 50 MG XR24H-TAB (METOPROLOL SUCCINATE) Take 1/2 tab daily for 1 week then take 1 tab daily  #30 x 6   Entered by:   Meredith Staggers, RN   Authorized by:   Talitha Givens, MD, [facility]   Signed by:   Meredith Staggers, RN on 02/07/2010   Method used:   Telephoned to ...       [facility] Department (retail)       [address] [place], [place]  [address]       Ph: [phone]       Fax: [phone]   RxID:    [id number]

## 2010-08-20 NOTE — Assessment & Plan Note (Signed)
Summary: 9AM COU APPT/CH  Anticoagulant Therapy PCP: Lyn Hollingshead [facility] Attending: Margarito Liner MD Indication 1: Pulmonary  embolus Indication 2: Encounter for therapeutic drug monitoring  V58.83 Start date: 01/16/2010 Duration: 6 months  Patient Assessment Reviewed by: Chancy Milroy PharmD  March 28, 2010 Medication review: verified warfarin dosage & schedule,verified previous prescription medications, verified doses & any changes, verified new medications, reviewed OTC medications, reviewed OTC health products-vitamins supplements etc Complications: none Dietary changes: none   Health status changes: none   Lifestyle changes: none   Recent/future hospitalizations: none   Recent/future procedures: none   Recent/future dental: none Patient Assessment Part 2:  Have you MISSED ANY DOSES or CHANGED TABLETS?  No missed Warfarin doses or changed tablets.  Have you had any BRUISING or BLEEDING ( nose or gum bleeds,blood in urine or stool)?  No reported bruising or bleeding in nose, gums, urine, stool.  Have you STARTED or STOPPED any MEDICATIONS, including OTC meds,herbals or supplements?  No other medications or herbal supplements were started or stopped.  Have you CHANGED your DIET, especially green vegetables,or ALCOHOL intake?  No changes in diet or alcohol intake.  Have you had any ILLNESSES or HOSPITALIZATIONS?  No reported illnesses or hospitalizations  Have you had any signs of CLOTTING?(chest discomfort,dizziness,shortness of breath,arms tingling,slurred speech,swelling or redness in leg)    No chest discomfort, dizziness, shortness of breath, tingling in arm, slurred speech, swelling, or redness in leg.     Treatment  Target INR: 2.0-3.0 INR: 2.3  Date: 03/28/2010 Regimen In:  40.0mg /week INR reflects regimen in: 2.3  New  Tablet strength: : 10mg  Regimen Out:     Sunday: 1/2 Tablet     Monday: 1/2 Tablet     Tuesday: 1/2 Tablet     Wednesday: 1/2 Tablet  Thursday: 1/2 Tablet      Friday: 1/2 Tablet     Saturday: 1 Tablet Total Weekly: 40.0mg /week mg  Next INR Due: 04/15/2010 Adjusted by: Barbera Setters. Alexandria Lodge III PharmD CACP   Return to anticoagulation clinic:  04/15/2010 Time of next visit: 1000    Allergies: 1)  ! Jonne Ply
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
## 2010-08-20 NOTE — Progress Notes (Signed)
Summary: Appointment  Phone Note Call from Patient   Caller: Patient Summary of Call: RTC from pt given appointmnet details for her Colonoscopy and pre -Colonoscopy appointments with [facility].   Pt was advised to take the card she recived for her [facility] discount.  Pt voiced understanding of. Angelina Ok RN  October 04, 2009 8:56 AM  Initial call taken by: Angelina Ok RN,  October 04, 2009 8:56 AM

## 2010-08-20 NOTE — Assessment & Plan Note (Signed)
Summary: RECK BP/HEEL PAIN/WHITWORTH/VS   Vital Signs:  Patient profile:   50 year old female Height:      67 inches (170.18 cm) Weight:      287.04 pounds (130.47 kg) BMI:     45.12 Temp:     97.4 degrees F (36.33 degrees C) oral Pulse rate:   92 / minute BP sitting:   161 / 99  (left arm)  Vitals Entered By: Angelina Ok RN (September 19, 2009 1:33 PM) CC: Depression Is Patient Diabetic? No Pain Assessment Patient in pain? yes     Location: chest, heel Intensity: 7/9 Type: thightness, sore Onset of pain  Constant Nutritional Status BMI of > 30 = obese  Have you ever been in a relationship where you felt threatened, hurt or afraid?No   Does patient need assistance? Functional Status Self care Ambulation Normal Comments Results of Sleep test.  Problems sleeping.  One of the meds makes her sleepy   CC:  Depression.  History of Present Illness: 50 year old female with PMH as described in EMR comes in today for a follow up of her sleep study results.  She also complains of excessive sleepyness in daytime and swelling in her leg. She is still having a lot of difficulty at night sleeping and wakes up frequently short of breath.  Depression History:      The patient is having a depressed mood most of the day but denies diminished interest in her usual daily activities.        The patient denies that she feels like life is not worth living, denies that she wishes that she were dead, and denies that she has thought about ending her life.         Preventive Screening-Counseling & Management  Alcohol-Tobacco     Alcohol drinks/day: 0     Smoking Status: quit     Year Quit: 10 YEARS AGO  Problems Prior to Update: 1)  Snoring  (ICD-786.09) 2)  Abdominal Wall Hernia  (ICD-553.20) 3)  Weight Gain  (ICD-783.1) 4)  Tinea Corporis  (ICD-110.5) 5)  Preventive Health Care  (ICD-V70.0) 6)  Essential Hypertension, Benign  (ICD-401.1)  Medications Prior to Update: 1)   Lisinopril-Hydrochlorothiazide 20-25 Mg Tabs (Lisinopril-Hydrochlorothiazide) .... Take 1 Tablet By Mouth Once A Day 2)  Clotrimazole 1 % Crea (Clotrimazole) .... Please Apply To Right Hip Rash Twice Daily For 7 Days Total. 3)  Amlodipine Besylate 5 Mg Tabs (Amlodipine Besylate) .... Take 1 Tablet By Mouth Once A Day  Current Medications (verified): 1)  Lisinopril-Hydrochlorothiazide 20-25 Mg Tabs (Lisinopril-Hydrochlorothiazide) .... Take 1 Tablet By Mouth Once A Day 2)  Clotrimazole 1 % Crea (Clotrimazole) .... Please Apply To Right Hip Rash Twice Daily For 7 Days Total. 3)  Amlodipine Besylate 5 Mg Tabs (Amlodipine Besylate) .... Take 1 Tablet By Mouth Once A Day  Allergies (verified): 1)  ! Jonne Ply  Past History:  Past Surgical History: Last updated: 06/04/2009 January 2004 Laparoscopically assisted vaginal hysterectomy for menorrhagia, dysmenorrhea, anemia, fibroids. No other surgeries.   Family History: Last updated: 09/19/2009 Mother: DM Dad: DM Older sister: DM No family members have had stroke, MI, cancer per her report.   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   Has upcoming job interview as a Lawyer.   Not married; single.  Through 10th grade in high school.  Some reading skills. Can write. Going to [employer] now to work on reading skills.   Risk Factors: Alcohol Use: 0 (09/19/2009) Exercise: no (06/04/2009)  Risk Factors: Smoking Status: quit (09/19/2009)  Family History: Mother: DM Dad: DM Older sister: DM No family members have had stroke, MI, cancer per her report.   Review of Systems      See HPI  Physical Exam  Additional Exam:  Gen: AOx3, in no acute distress Eyes: PERRL, EOMI ENT:MMM, No erythema noted in posterior pharynx Neck: No JVD, No LAP Chest: CTAB with  good respiratory effort CVS: regular  rhythmic rate, NO M/R/G, S1 S2 normal Abdo: soft,ND, BS+x4, Non tender and No hepatosplenomegaly EXT: 1+edema noted Neuro: Non focal, gait is normal Skin: no rashes noted.    Impression & Recommendations:  Problem # 1:  OBSTRUCTIVE SLEEP APNEA (ICD-327.23) Patient will prescribed CPAP at settings of 5/5 and also evaluate her cardiac function by doing a 2d-echo as she was having fluid in her bilateral ankles. Orders: 2 D Echo (2 D Echo)  Problem # 2:  WEIGHT GAIN (ICD-783.1) Gave her a [employer] scholarshipformfor joining [employer]. Also gave her information about [employer] lefestyle classesa nd stressed the importance of losing weight specifically with Severe OSA and hypertension. Orders: 2 D Echo (2 D Echo)  Problem # 3:  ESSENTIAL HYPERTENSION, BENIGN (ICD-401.1) Her blood pressure was hignas she was not taking one of her meds and said that it is making her sleepy. I told her that its not the med but its her OSA which is making her sleepy and she should start taking her meds again. Her updated medication list for this problem includes:    Lisinopril-hydrochlorothiazide 20-25 Mg Tabs (Lisinopril-hydrochlorothiazide) .Marland Kitchen... Take 1 tablet by mouth once a day    Amlodipine Besylate 5 Mg Tabs (Amlodipine besylate) .Marland Kitchen... Take 1 tablet by mouth once a day  Future Orders: T-Comprehensive Metabolic Panel ([id number]) ... 10/08/2009  BP today: 161/99 Prior BP: 170/102 (06/19/2009)  Prior 10 Yr Risk Heart Disease: Not enough information (06/04/2009)  Labs Reviewed: K+: 4.8 (06/19/2009) Creat: : 0.61 (06/19/2009)   Chol: 188 (04/22/2008)   HDL: 47 (04/22/2008)   LDL: 127 (04/22/2008)   TG: 68 (04/22/2008)  Problem # 4:  PREVENTIVE HEALTH CARE (ICD-V70.0) I gave her GI referrel and will chekc lipid profile in next visit. Mammogran was scheduled. Orders: Gastroenterology Referral (GI)Future Orders: T-Lipid Profile ([id number]) ... 10/08/2009  Problem # 5:  SNORING  (ICD-786.09) Going to get beeter with CPAP device. Her updated medication list for this problem includes:    Lisinopril-hydrochlorothiazide 20-25 Mg Tabs (Lisinopril-hydrochlorothiazide) .Marland Kitchen... Take 1 tablet by mouth once a day  Complete Medication List: 1)  Lisinopril-hydrochlorothiazide 20-25 Mg Tabs (Lisinopril-hydrochlorothiazide) .... Take 1 tablet by mouth once a day 2)  Clotrimazole 1 % Crea (Clotrimazole) .... Please apply to right hip rash twice daily for 7 days total. 3)  Amlodipine Besylate 5 Mg Tabs (Amlodipine besylate) .... Take 1 tablet by mouth once a day  Other Orders: Mammogram (Screening) (Mammo)  Patient Instructions: 1)  Please schedule a follow-up appointment in 2 weeks. 2)  Limit your Sodium (Salt) to less than 2 grams a day(slightly less than 1/2 a teaspoon) to prevent fluid retention, swelling, or worsening of symptoms. 3)  It is important that you exercise regularly at least 20 minutes 5 times a week. If you develop chest pain, have severe difficulty breathing, or feel very tired , stop exercising immediately and seek medical  attention. 4)  You need to lose weight. Consider a lower calorie diet and regular exercise.  5)  Schedule your mammogram. 6)  Schedule a colonoscopy/sigmoidoscopy to help detect colon cancer. 7)  Check your Blood Pressure regularly. If it is above:140/90 you should make an appointment. Prescriptions: DICLOFENAC SODIUM 75 MG TBEC (DICLOFENAC SODIUM) Take 1 tablet by mouth two times a day  #30 x 0   Entered and Authorized by:   Lars Mage MD   Signed by:   Lars Mage MD on 09/20/2009   Method used:   Print then Give to Patient   RxID:   [id number]   Prevention & Chronic Care Immunizations   Influenza vaccine: Not documented   Influenza vaccine deferral: Refused  (06/19/2009)    Tetanus booster: 07/22/2003: historical from records given    Pneumococcal vaccine: Not documented  Colorectal Screening   Hemoccult: Not documented    Hemoccult action/deferral: Deferred  (06/19/2009)    Colonoscopy: Not documented   Colonoscopy action/deferral: GI referral  (09/19/2009)  Other Screening   Pap smear: Not documented   Pap smear action/deferral: Not indicated S/P hysterectomy  (06/19/2009)    Mammogram: Not documented   Mammogram action/deferral: Ordered  (09/19/2009)   Smoking status: quit  (09/19/2009)  Lipids   Total Cholesterol: 188  (04/22/2008)   LDL: Not documented   LDL Direct: Not documented   HDL: 47  (04/22/2008)   Triglycerides: Not documented  Hypertension   Last Blood Pressure: 161 / 99  (09/19/2009)   Serum creatinine: 0.61  (06/19/2009)   Serum potassium 4.8  (06/19/2009) CMP ordered     Hypertension flowsheet reviewed?: Yes   Progress toward BP goal: Deteriorated  Self-Management Support :    Patient will work on the following items until the next clinic visit to reach self-care goals:     Medications and monitoring: take my medicines every day, bring all of my medications to every visit  (09/19/2009)     Eating: drink diet soda or water instead of juice or soda, eat more vegetables, eat foods that are low in salt, eat baked foods instead of fried foods, eat fruit for snacks and desserts, limit or avoid alcohol  (09/19/2009)     Activity: take a 30 minute walk every day  (09/19/2009)    Hypertension self-management support: Written self-care plan, Education handout  (09/19/2009)   Hypertension self-care plan printed.   Hypertension education handout printed   Nursing Instructions: GI referral for screening colonoscopy (see order) Schedule screening mammogram (see order)   Process Orders Check Orders Results:     [facility]: ABN not required for this insurance Tests Sent for requisitioning (September 20, 2009 12:17 PM):     10/08/2009: [facility] -- T-Lipid Profile [id number] (signed)     10/08/2009: [facility] -- T-Comprehensive  Metabolic Panel [id number] (signed)     Vital Signs:  Patient profile:   50 year old female Height:      67 inches (170.18 cm) Weight:      287.04 pounds (130.47 kg) BMI:     45.12 Temp:     97.4 degrees F (36.33 degrees C) oral Pulse rate:   92 / minute BP sitting:   161 / 99  (left arm)  Vitals Entered By: Angelina Ok RN (September 19, 2009 1:33 PM)

## 2010-08-20 NOTE — Assessment & Plan Note (Signed)
Summary: SHOULDER/ARM PAIN,MC   Vital Signs:  Patient profile:   50 year old female Height:      65.5 inches Weight:      276 pounds BMI:     45.39 Pulse rate:   64 / minute BP sitting:   127 / 84  (left arm)  Vitals Entered By: Terese Door (April 01, 2010 1:40 PM) CC: right shoulder/arm pain; left ankle pain   Primary Care Provider:  Lyn Hollingshead  CC:  right shoulder/arm pain; left ankle pain.  History of Present Illness: f/u right shoulder pain--did exercises some of the time--they were pretty painful. The injection helped only for a week. also now having somepains down into her right hand  left achilles no better either.  no new symptoms  Allergies: 1)  ! Asa  Review of Systems  The patient denies fever.         Please see HPI for additional ROS.   Physical Exam  General:  alert, well-developed, well-nourished, well-hydrated, and overweight-appearing.   Msk:  R shoulder positive impingement signs. + hawkins and [employer]. Pain reduces ROm but otherwise she has FROM. Normal rotator cuff strength in allplanes. distally neurovascualrly intact.  ledt achilles is TTP insertion. no deformity or defect. intact plantar flexion, dorsiflexion   Impression & Recommendations:  Problem # 1:  ROTATOR CUFF SYNDROME, RIGHT (ICD-726.10) will set up for formal PT and rtc 5 w  Problem # 2:  ACHILLES BURSITIS OR TENDINITIS (ICD-726.71) PT as above  Complete Medication List: 1)  Lisinopril-hydrochlorothiazide 20-12.5 Mg Tabs (Lisinopril-hydrochlorothiazide) .... Take 1 tablet by mouth every morning 2)  Warfarin Sodium 10 Mg Tabs (Warfarin sodium) .... Tablet strength: 10mg  take as directed 3)  Amlodipine Besylate 10 Mg Tabs (Amlodipine besylate) .... Take 1/2 tablet by mouth once a day

## 2010-08-20 NOTE — Assessment & Plan Note (Signed)
Summary: NP,R ARM/SHOULDER,L ACHILLIES,MC   Vital Signs:  Patient profile:   50 year old female Height:      67 inches BP sitting:   142 / 84  (left arm) Cuff size:   regular  Vitals Entered By: Tessie Fass CMA (February 11, 2010 2:42 PM) CC: left ankle and right shoulder pain   Primary Care Provider:  Lyn Hollingshead  CC:  left ankle and right shoulder pain.  History of Present Illness: 50 yo F with L heel and R shoulder pain.  L Heel:  present for several months now, worst at insertion of achilles into calcaneus, worse with any activity req plantarflexion.  Better with rest, hasn't taken any medication for this.  No swelling.  no known injury.  Pain is sharp.  R shoulder:  Worst in deltoid region, sharp, no injury, worse with overhead activities, brushing hair, teeth, cannot reach behind her back. Better with rest.  Gradual onset, wakes her from sleep.    Currently on coumadin.for DVT/PE.  Current Medications (verified): 1)  Lisinopril-Hydrochlorothiazide 20-25 Mg Tabs (Lisinopril-Hydrochlorothiazide) .... Take 1 Tablet By Mouth Once A Day 2)  Warfarin Sodium 10 Mg Tabs (Warfarin Sodium) .... Tablet Strength: 10mg  Take As Directed 3)  Amlodipine Besylate 10 Mg Tabs (Amlodipine Besylate) .... Take 1 Tablet By Mouth Once A Day 4)  Metoprolol Succinate 50 Mg Xr24h-Tab (Metoprolol Succinate) .... Take 1/2 Tab Daily For 1 Week Then Take 1 Tab Daily  Allergies (verified): 1)  ! Asa  Review of Systems       See HPI  Physical Exam  General:  Well-developed,well-nourished,in no acute distress; alert,appropriate and cooperative throughout examination Msk:  Shoulder: R Inspection reveals no abnormalities, atrophy or asymmetry. Palpation is normal with no tenderness over AC joint or bicipital groove. ROM is limited in abduction and IR. Rotator cuff strength weak to SST and Subscap. Positive Neer, Hawkins, Empty can, scratch test, painful arc. Speeds and Yergason's tests normal. No  labral pathology noted with negative Obrien's, negative clunk and good stability. Normal scapular function observed. No drop arm sign. No apprehension sign  Ankle:L No visible erythema or swelling. Range of motion is full in all directions. Strength is 5/5 in all directions. Stable lateral and medial ligaments; squeeze test and kleiger test unremarkable; Talar dome nontender; No pain at base of 5th MT; No tenderness over cuboid; No tenderness over N spot or navicular prominence No tenderness on posterior aspects of lateral and medial malleolus No sign of peroneal tendon subluxations; Negative tarsal tunnel tinel's Able to walk 4 steps. very painful over achilles just above insertion on calcaneus. Additional Exam:  MSK [place]:  Achilles tendon was examined through its entire length.  Dopper signals were normal along the entire tendon, There were some spurs seen at the insertion with the calcaneus, also some calcification seen in the tendon substance itself at the site of pain. The tendon itself appears somewhat vacuolated on transverse view.  Images saved.  Consent obtained and verified. Sterile betadine prep. Furthur cleansed with alcohol. Topical analgesic spray: Ethyl chloride. Joint:R subacromial Completed without difficulty Meds:1cc kenalog 40, 3 cc lidocaine. Needle:25g Aftercare instructions and Red flags advised.  Immediate improvement in symptoms.     Impression & Recommendations:  Problem # 1:  ACHILLES BURSITIS OR TENDINITIS (ICD-726.71) Assessment Unchanged  Spurring, calcification, and tendinopathy seen on [place]. with achilles eccentric rehab exercises, handout given.  May use NSAID of choice for pain. Ice if swells. Consider NTG patches over painful area if  no better. RTC 1 m.  Orders: [place] LIMITED ([address])  Problem # 2:  SHOULDER PAIN, RIGHT (ICD-719.41) Assessment: Improved Symptoms suggestive of rotator cuff syndrome, Rotator cuff rehab exercises given as well  as 52m of theraband.  She is to avoid shoulder flexion exercises for now but can do IR/ER exercises. NSAID of choice. RTC 4 w  Problem # 3:  ROTATOR CUFF SYNDROME, RIGHT (ICD-726.10) Assessment: New  See #2.  Orders: Joint Aspirate / Injection, Large (20610) Kenalog 10 mg inj (J3301)  Complete Medication List: 1)  Lisinopril-hydrochlorothiazide 20-25 Mg Tabs (Lisinopril-hydrochlorothiazide) .... Take 1 tablet by mouth once a day 2)  Warfarin Sodium 10 Mg Tabs (Warfarin sodium) .... Tablet strength: 10mg  take as directed 3)  Amlodipine Besylate 10 Mg Tabs (Amlodipine besylate) .... Take 1 tablet by mouth once a day 4)  Metoprolol Succinate 50 Mg Xr24h-tab (Metoprolol succinate) .... Take 1/2 tab daily for 1 week then take 1 tab daily

## 2010-08-20 NOTE — Progress Notes (Signed)
Summary: Disability  Phone Note Call from Patient   Caller: Patient Call For: Aris Lot MD Summary of Call: Call from pt said that she wanted to let her doctor as well as the Clinics know that she is applying for disability. [place] Spaniel that she is currently going through Therapy. Angelina Ok RN  October 05, 2009 10:05 AM  Initial call taken by: Angelina Ok RN,  October 05, 2009 10:05 AM  Follow-up for Phone Call        I clarified with Breanna Wilkins that this patient was just letting us know. She apparently does not need [place] to do anything [i.e. paperwork] at this time. I am not aware that she has any disability. We will wait to see if she would like to be evaluated in clinic for paperwork purposes, but at this time I am unaware of any disability that she has. Follow-up by: Aris Lot MD,  October 05, 2009 10:28 AM

## 2010-08-20 NOTE — Progress Notes (Signed)
Summary: refill/ hla  Phone Note Refill Request Message from:  Patient on February 15, 2010 3:40 PM  Refills Requested: Medication #1:  AMLODIPINE BESYLATE 10 MG TABS Take 1 tablet by mouth once a day   Dosage confirmed as above?Dosage Confirmed Initial call taken by: Marin Roberts RN,  February 15, 2010 3:40 PM    Prescriptions: AMLODIPINE BESYLATE 10 MG TABS (AMLODIPINE BESYLATE) Take 1 tablet by mouth once a day  #30 x 5   Entered and Authorized by:   Lyn Hollingshead   Signed by:   Lyn Hollingshead on 02/15/2010   Method used:   Electronically to        [facility] (retail)       [address]       [place], [place]  [id number]       Ph: [phone]       Fax: [phone]   RxID:   [id number]

## 2010-08-20 NOTE — Miscellaneous (Signed)
  Clinical Lists Changes  Observations: Added new observation of PAST MED HX: Shoulder pain Shortness of breath Pulmonary embolus   01/16/2010..treat with coumadin OSA Abdominal hernia Hypertension Overweight Lung nodule  very small  LUL...by CT  12/2009...needs one year F/U Coumadin Rx. EF 65-70%..... vigorous function... echo... March, 2011 (03/29/2010 15:30) Added new observation of PRIMARY MD: Lyn Hollingshead (03/29/2010 15:30)       Past History:  Past Medical History: Shoulder pain Shortness of breath Pulmonary embolus   01/16/2010..treat with coumadin OSA Abdominal hernia Hypertension Overweight Lung nodule  very small  LUL...by CT  12/2009...needs one year F/U Coumadin Rx. EF 65-70%..... vigorous function... echo... March, 2011

## 2010-08-20 NOTE — Assessment & Plan Note (Signed)
Summary: ACUTE-CRAMPS IN LEGS/BACK/NECK(PATEL_/CFB   Vital Signs:  Patient profile:   50 year old female Height:      65.5 inches (166.37 cm) Weight:      279 pounds (126.82 kg) BMI:     45.89 O2 Sat:      100 % on Room air Temp:     97.2 degrees F Pulse rate:   74 / minute BP sitting:   107 / 74  (right arm) BP standing:   124 / 74  (right arm) Cuff size:   large  Vitals Entered By: Dorie Rank RN (February 25, 2010 1:42 PM)  O2 Flow:  Room air CC: c/o dizziness, blurry vision when stood up yesterday - also c/o muscle spasms toes, fingers, back of leg calf, shoulder -neck starting 3-4days ago Is Patient Diabetic? No Pain Assessment Patient in pain? no      Nutritional Status BMI of > 30 = obese Comments got shot at Sports Med about 2 weeks ago-standing pulse 112   Primary Care Provider:  Lyn Hollingshead  CC:  c/o dizziness, blurry vision when stood up yesterday - also c/o muscle spasms toes, fingers, back of leg calf, and shoulder -neck starting 3-4days ago.  History of Present Illness: Patient presents today with complaint of muscle spasm/cramps in her fingers, toes, calves, back, arm and neck, and jaw.  She says her muscle spasms started 2-3 weeks ago.  Pressure in her jaw, especially when chewing, makes it difficult to open the mouth. Some jaw pain when talking to me during the exam. Patient saw Sports Medicine on July 25 and got kenalog injection in right shoulder and she thinks these cramps have happened around the time she got the injection.  She states these cramps happen continuously, whenever she moves.    She works out at [employer], last visit on Friday for 45 minutes and the cramps did not bother during the biking and walking.  Yesterday very scared because she developed "blurry and dim" vision for 15 minutes.  At that time she felt tight in her jaw. Patient has been eating and drinking as normal; trying to drink more water than usual.    Complains of greater than  several months of increased noctural urination.  Wakes up every 1.5 hours to go to the bathroom.    Patient complains of not sleeping well. Patient has been compliant with her medicines.  Patient continues to have shortness of breath with exertion, and she says that it's a little better with the beta blocker.  She also attributes it to being hot outside.      Current Problems (verified): 1)  Muscle Cramps  (ICD-729.82) 2)  Plantar Fasciitis, Bilateral  (ICD-728.71) 3)  Rotator Cuff Syndrome, Right  (ICD-726.10) 4)  Vigorous Lv Function  () 5)  Lung Nodule  () 6)  Coumadin Therapy  (ICD-V58.61) 7)  Shoulder Pain, Right  (ICD-719.41) 8)  Shortness of Breath  (ICD-786.05) 9)  Pe  (ICD-415.19) 10)  Achilles Bursitis or Tendinitis  (ICD-726.71) 11)  Obstructive Sleep Apnea  (ICD-327.23) 12)  Snoring  (ICD-786.09) 13)  Abdominal Wall Hernia  (ICD-553.20) 14)  Weight Gain  (ICD-783.1) 15)  Tinea Corporis  (ICD-110.5) 16)  Preventive Health Care  (ICD-V70.0) 17)  Essential Hypertension, Benign  (ICD-401.1)  Current Medications (verified): 1)  Lisinopril-Hydrochlorothiazide 20-25 Mg Tabs (Lisinopril-Hydrochlorothiazide) .... Take 1 Tablet By Mouth Once A Day 2)  Warfarin Sodium 10 Mg Tabs (Warfarin Sodium) .... Tablet Strength: 10mg  Take As  Directed 3)  Amlodipine Besylate 10 Mg Tabs (Amlodipine Besylate) .... Take 1 Tablet By Mouth Once A Day  Allergies (verified): 1)  ! Asa  Past History:  Past Medical History: Shoulder pain Shortness of breath Pulmonary embolus   01/16/2010..treat with coumadin OSA Abdominal hernia Hypertension Overweight Lung nodule  LUL...by CT  12/2009...needs one year F/U Coumadin Rx. vigorous LV function  Past Surgical History: Reviewed history from 06/04/2009 and no changes required. January 2004 Laparoscopically assisted vaginal hysterectomy for menorrhagia, dysmenorrhea, anemia, fibroids. No other surgeries.   Family History: Reviewed history  from 09/19/2009 and no changes required. Mother: DM Dad: DM Older sister: DM No family members have had stroke, MI, cancer per her report.   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 Recently lost job as well as insurance in Feb 2010.  Not married; single. Through 10th grade in high school. Some reading skills. Can write.  Tobacco Use - Former.  Alcohol Use - no Regular Exercise - no Drug Use - no  Review of Systems       see HPI  Physical Exam  General:  alert, NAD.  obese.  Head:  atraumatic.   Ears:  no external deformities.   Nose:  no external erythema and no nasal discharge.   Mouth:  pharynx pink and moist and poor dentition.   Neck:  supple and full ROM.   Lungs:  normal respiratory effort, normal breath sounds, no crackles, and no wheezes.   Heart:  normal rate, regular rhythm, and no murmur.   Abdomen:  soft, non-tender, and normal bowel sounds.  5cm fully-reducible slightly tender mass at umbilicus. Msk:  tenderness to movement of right shoulder with mildly limited ROM.  Otherwise no joint tenderness. Pulses:  2+ bilateral dorsalis pedis pulses. Extremities:  trace pedal edema bilaterally. Neurologic:  CN grossly intact. alert & oriented X3 and gait normal.   Psych:  Oriented X3, normally interactive, good eye contact, not anxious appearing, and not depressed appearing.      Impression & Recommendations:  Problem # 1:  MUSCLE CRAMPS (ICD-729.82) DDx includes dehydration, electrolyte imbalance (secondary to medication (beta blocker, diuretics) or idiopathic.  Will check TSH, ionized Ca, Mg, BMet today.  Encouraged adequate fluid intake.  We will hold the patient's beta blocker for now as they fit the patient's timeline of symptoms.  Pt is on diuretics however has not had this problem in the past with them, so these are less likely the cause.  Could also be iron deficiency anemia, however no evidence of bleeding  (patient has has hysterectomy and denies any melena or hematochezia).  Will check CBC.  Will check TSH as well because hypothyroidism can cause cramping.  Instructed the patient to schedule appointment with [place] if she is no better in 2 weeks.  We will call her with her lab results if there are any abnormalities.  Orders: T-Basic Metabolic Panel [id number]) T-CBC No Diff ([id number]) T-Magnesium [id number]) T-Calcium, Ionized ([id number]) T-TSH ([id number])  Problem # 2:  ABDOMINAL WALL HERNIA (ICD-553.20) Patient states her hernia at the site of one of her operative incisions at the umbilicus has grown in size.  It is mildly tender to palpation but fully reducible and soft.  She was told that if it becomes hard, cannot be pushed back in or becomes very tender, she is to come to the clinic or to the ED because it could be incarcerated.  We will continue to monitor  this for now.   Problem # 3:  * VIGOROUS LV FUNCTION Patient saw cardiology and was started on beta blocker.  We will d/c this today as timeline of starting of cramps coincided roughly with initiation of the patient's beta blocker.   Patient is to follow-up with cardiology in 4 weeks.  Problem # 4:  ROTATOR CUFF SYNDROME, RIGHT (ICD-726.10) Followed by sports medicine for this.  Received kenalog injection 2 weeks ago that pt states did not help.  Unlikely that the kenalog is causing the patient's cramps today.  Patient is to follow-up with sports medicine in 2 weeks.   Complete Medication List: 1)  Lisinopril-hydrochlorothiazide 20-25 Mg Tabs (Lisinopril-hydrochlorothiazide) .... Take 1 tablet by mouth once a day 2)  Warfarin Sodium 10 Mg Tabs (Warfarin sodium) .... Tablet strength: 10mg  take as directed 3)  Amlodipine Besylate 10 Mg Tabs (Amlodipine besylate) .... Take 1 tablet by mouth once a day  Patient Instructions: 1)  Today you were evaluated for muscle cramps.  Please stop taking the metoprolol.  Try to stay hydrated.   If your cramps have not improved in 2 weeks, please call the clinic and schedule an appointment. 2)  You had blood work today - we will call you if there are any abnormal results. 3)  Continue your exercise as tolerated - you have lost 4 pounds.  Keep it up!  Our goal is 1 pound of weight loss a week. Process Orders Check Orders Results:     [facility]: ABN not required for this insurance Order queued for requisitioning for [facility]: February 25, 2010 3:05 PM  Tests Sent for requisitioning (February 25, 2010 6:10 PM):     02/25/2010: [facility] -- T-Basic Metabolic Panel [phone] (signed)     02/25/2010: [facility] -- T-CBC No Diff [[id number]] (signed)     02/25/2010: [facility] -- T-Magnesium [[id number]] (signed)     02/25/2010: [facility] -- T-Calcium, Ionized [[id number]] (signed)     02/25/2010: [facility] -- T-TSH [id number] (signed)     Prevention & Chronic Care Immunizations   Influenza vaccine: Not documented   Influenza vaccine deferral: Refused  (06/19/2009)    Tetanus booster: 07/22/2003: historical from records given    Pneumococcal vaccine: Not documented  Colorectal Screening   Hemoccult: Not documented   Hemoccult action/deferral: Deferred  (06/19/2009)    Colonoscopy: DONE  (10/31/2009)   Colonoscopy action/deferral: GI referral  (09/19/2009)   Colonoscopy due: 10/2019  Other Screening   Pap smear: Not documented   Pap smear action/deferral: Not indicated S/P hysterectomy  (06/19/2009)    Mammogram: ASSESSMENT: Negative - BI-RADS 1^MM DIGITAL SCREENING  (10/02/2009)   Mammogram action/deferral: Ordered  (09/19/2009)   Smoking status: quit  (02/07/2010)  Lipids   Total Cholesterol: 212  (10/03/2009)   LDL: 144  (10/03/2009)   LDL Direct: Not documented   HDL: 46  (10/03/2009)   Triglycerides: 111  (10/03/2009)  Hypertension   Last Blood  Pressure: 107 / 74  (02/25/2010)   Serum creatinine: 0.70  (10/03/2009)   Serum potassium 4.4  (10/03/2009)  Self-Management Support :   Personal Goals (by the next clinic visit) :      Personal blood pressure goal: 140/90  (10/03/2009)   Patient will work on the following items until the next clinic visit to reach self-care goals:     Medications and monitoring: take my medicines every day, bring all of my medications to every visit  (  02/25/2010)     Eating: drink diet soda or water instead of juice or soda, eat more vegetables, eat foods that are low in salt, eat baked foods instead of fried foods, eat fruit for snacks and desserts  (02/25/2010)     Activity: join a walking program  (02/25/2010)     Other: watching greens since on Coumadin- joined [employer]  (02/25/2010)    Hypertension self-management support: Pre-printed educational material, Written self-care plan, Resources for patients handout  (02/25/2010)   Hypertension self-care plan printed.      Resource handout printed.     Appended Document: ACUTE-CRAMPS IN LEGS/BACK/NECK(PATEL_/CFB I saw Ms Goecke with Dr Claudette Laws and I agree with her note above. Pts sxs started shortly after BB started and it can cause muscle cramps. Agree with D/C med and see if sxs resolve. We will also do metabolic W/U, HgB, TSH to R/O other causes. Pt already has F/U with cards to address being off BB. Dr Rogelia Boga

## 2010-08-20 NOTE — Miscellaneous (Signed)
Summary: [facility]  [facility]   Imported By: Shon Hough 02/08/2010 14:20:09  _____________________________________________________________________  External Attachment:    Type:   Image     Comment:   External Document

## 2010-08-20 NOTE — Procedures (Signed)
Summary: Colonoscopy  Patient: [name] Note: All result statuses are Final unless otherwise noted.  Tests: (1) Colonoscopy (COL)   COL Colonoscopy           [facility]     [address]     [place], [place]  [address]           COLONOSCOPY PROCEDURE REPORT           PATIENT:  Breanna Wilkins, Breanna Wilkins  MR#:  [id number]     BIRTHDATE:  01-Nov-1960, 48 yrs. old  GENDER:  female     ENDOSCOPIST:  Vania Rea. Jarold Motto, MD, [facility]     REF. BY:     PROCEDURE DATE:  10/31/2009     PROCEDURE:  Average-risk screening colonoscopy     G0121     ASA CLASS:  Class II     INDICATIONS:  Routine Risk Screening     MEDICATIONS:   Fentanyl 75 mcg IV, Versed 7 mg IV           DESCRIPTION OF PROCEDURE:   After the risks benefits and     alternatives of the procedure were thoroughly explained, informed     consent was obtained.  Digital rectal exam was performed and     revealed no abnormalities.   The LB [id number] endoscope     was introduced through the anus and advanced to the cecum, hard to     see cecal tip.  The quality of the prep was adequate, using     MoviPrep.  The instrument was then slowly withdrawn as the colon     was fully examined.     <<PROCEDUREIMAGES>>           FINDINGS:  No polyps or cancers were seen.  This was otherwise a     normal examination of the colon.   Retroflexed views in the rectum     revealed no abnormalities.    The scope was then withdrawn from     the patient and the procedure completed.           COMPLICATIONS:  None     ENDOSCOPIC IMPRESSION:     1) No polyps or cancers     2) Otherwise normal examination     RECOMMENDATIONS:     1) Continue current colorectal screening recommendations for     "routine risk" patients with a repeat colonoscopy in 10 years.     REPEAT EXAM:  No           ______________________________     Vania Rea. Jarold Motto, MD, [facility]           CC:  Lars Mage MD           n.     REVISED:  10/31/2009 01:36  PM     eSIGNED:   Vania Rea. Gadge Hermiz at 10/31/2009 01:36 PM           Wynetta Emery, [id number]  Note: An exclamation mark (!) indicates a result that was not dispersed into the flowsheet. Document Creation Date: 10/31/2009 1:37 PM _______________________________________________________________________  (1) Order result status: Final Collection or observation date-time: 10/31/2009 09:29 Requested date-time:  Receipt date-time:  Reported date-time:  Referring Physician:   Ordering Physician: Sheryn Bison [phone]) Specimen Source:  Source: Launa Grill Order Number: [id number] Lab site:   Appended Document: Colonoscopy    Clinical Lists Changes  Observations: Added new observation of COLONNXTDUE: 10/2019 (  10/31/2009 14:14)     

## 2010-08-20 NOTE — Progress Notes (Signed)
Summary: chest pain/ hla  Phone Note Call from Patient   Summary of Call: c/o chest pain and shortness of breath, advised to go to ED, pt agreeable Initial call taken by: Marin Roberts RN,  January 15, 2010 5:55 PM

## 2010-08-20 NOTE — Assessment & Plan Note (Signed)
Summary: COU/CH  Anticoagulant Therapy PCP: Aris Lot MD [facility] Attending: Coralee Pesa MD, Levada Schilling Indication 1: Pulmonary  embolus Indication 2: Encounter for therapeutic drug monitoring  V58.83 Start date: 01/16/2010 Duration: 6 months  Patient Assessment Reviewed by: Chancy Milroy PharmD  February 04, 2010 Medication review: verified warfarin dosage & schedule,verified previous prescription medications, verified doses & any changes, verified new medications, reviewed OTC medications, reviewed OTC health products-vitamins supplements etc Complications: none Dietary changes: none   Health status changes: none   Lifestyle changes: none   Recent/future hospitalizations: none   Recent/future procedures: none   Recent/future dental: none Patient Assessment Part 2:  Have you MISSED ANY DOSES or CHANGED TABLETS?  No missed Warfarin doses or changed tablets.  Have you had any BRUISING or BLEEDING ( nose or gum bleeds,blood in urine or stool)?  No reported bruising or bleeding in nose, gums, urine, stool.  Have you STARTED or STOPPED any MEDICATIONS, including OTC meds,herbals or supplements?  No other medications or herbal supplements were started or stopped.  Have you CHANGED your DIET, especially green vegetables,or ALCOHOL intake?  No changes in diet or alcohol intake.  Have you had any ILLNESSES or HOSPITALIZATIONS?  No reported illnesses or hospitalizations  Have you had any signs of CLOTTING?(chest discomfort,dizziness,shortness of breath,arms tingling,slurred speech,swelling or redness in leg)    No chest discomfort, dizziness, shortness of breath, tingling in arm, slurred speech, swelling, or redness in leg.     Treatment  Target INR: 2.0-3.0 INR: 2.9  Date: 02/04/2010 Regimen In:  60.0mg /week INR reflects regimen in: 2.9  New  Tablet strength: : 5mg  Regimen Out:     Sunday: 1 & 1/2 Tablet     Monday: 1 & 1/2 Tablet     Tuesday: 1 & 1/2 Tablet     Wednesday: 2  Tablet     Thursday: 1 & 1/2 Tablet      Friday: 1 & 1/2 Tablet     Saturday: 1 & 1/2 Tablet Total Weekly: 55.0mg /week mg  Next INR Due: 02/18/2010 Adjusted by: Barbera Setters. Alexandria Lodge III PharmD CACP   Return to anticoagulation clinic:  02/18/2010 Time of next visit: 1030    Allergies: 1)  ! Jonne Ply

## 2010-08-20 NOTE — Miscellaneous (Signed)
Summary: [facility]  [facility]   Imported By: Shon Hough 02/19/2010 14:09:19  _____________________________________________________________________  External Attachment:    Type:   Image     Comment:   External Document

## 2010-08-20 NOTE — Progress Notes (Signed)
Summary: refill meds  Phone Note Call from Patient Call back at Home Phone [phone]   Refills Requested: Medication #1:  LISINOPRIL-HYDROCHLOROTHIAZIDE 20-12.5 MG TABS Take 1 tablet by mouth every morning [facility] [address] [phone]   Method Requested: Fax to Local Pharmacy Initial call taken by: Lorne Skeens,  April 04, 2010 3:26 PM  Follow-up for Phone Call       Follow-up by: Judithe Modest CMA,  April 05, 2010 11:16 AM    Prescriptions: LISINOPRIL-HYDROCHLOROTHIAZIDE 20-12.5 MG TABS (LISINOPRIL-HYDROCHLOROTHIAZIDE) Take 1 tablet by mouth every morning  #30 x 11   Entered by:   Judithe Modest CMA   Authorized by:   Talitha Givens, MD, [facility]   Signed by:   Judithe Modest CMA on 04/05/2010   Method used:   Electronically to        [facility] (retail)       [address]       [place], [place]  [address]       Ph: [phone]       Fax: [phone]   RxID:   [id number]

## 2010-08-20 NOTE — Miscellaneous (Signed)
Summary: [facility]  Clinical Lists Changes  Observations: Added new observation of ALLERGY REV: Done (10/22/2009 10:58)  Pt. does not have insurance, uses [facility]. Do not have Moviprep.  Rx for Miralax, Duccolax, and Reglan given to pharmacist by phone.

## 2010-08-20 NOTE — Letter (Signed)
Summary: [facility] PT Referral form  [facility] PT Referral form   Imported By: Marily Memos 04/02/2010 09:14:44  _____________________________________________________________________  External Attachment:    Type:   Image     Comment:   External Document

## 2010-08-20 NOTE — Assessment & Plan Note (Signed)
Summary: COU/CH  Anticoagulant Therapy Managed by: Barbera Setters. Breanna Wilkins  PharmD CACP PCP: Lyn Hollingshead [facility] Attending: Coralee Pesa MD, Levada Schilling Indication 1: Pulmonary  embolus Indication 2: Encounter for therapeutic drug monitoring  V58.83 Start date: 01/16/2010 Duration: 6 months  Patient Assessment Reviewed by: Chancy Milroy PharmD  April 15, 2010 Medication review: verified warfarin dosage & schedule,verified previous prescription medications, verified doses & any changes, verified new medications, reviewed OTC medications, reviewed OTC health products-vitamins supplements etc Complications: none Dietary changes: none   Health status changes: none   Lifestyle changes: none   Recent/future hospitalizations: none   Recent/future procedures: none   Recent/future dental: none Patient Assessment Part 2:  Have you MISSED ANY DOSES or CHANGED TABLETS?  No missed Warfarin doses or changed tablets.  Have you had any BRUISING or BLEEDING ( nose or gum bleeds,blood in urine or stool)?  No reported bruising or bleeding in nose, gums, urine, stool.  Have you STARTED or STOPPED any MEDICATIONS, including OTC meds,herbals or supplements?  No other medications or herbal supplements were started or stopped.  Have you CHANGED your DIET, especially green vegetables,or ALCOHOL intake?  No changes in diet or alcohol intake.  Have you had any ILLNESSES or HOSPITALIZATIONS?  No reported illnesses or hospitalizations  Have you had any signs of CLOTTING?(chest discomfort,dizziness,shortness of breath,arms tingling,slurred speech,swelling or redness in leg)    No chest discomfort, dizziness, shortness of breath, tingling in arm, slurred speech, swelling, or redness in leg.     Treatment  Target INR: 2.0-3.0 INR: 2.4  Date: 04/15/2010 Regimen In:  40.0mg /week INR reflects regimen in: 2.4  New  Tablet strength: : 10mg  Regimen Out:     Sunday: 1/2 Tablet     Monday: 1 Tablet     Tuesday: 1/2  Tablet     Wednesday: 1/2 Tablet     Thursday: 1 Tablet      Friday: 1/2 Tablet     Saturday: 1/2 Tablet Total Weekly: 45.0mg /week mg  Next INR Due: 05/06/2010 Adjusted by: Barbera Setters. Alexandria Lodge III PharmD CACP   Return to anticoagulation clinic:  05/06/2010 Time of next visit: 1000    Allergies: 1)  ! Jonne Ply

## 2010-08-20 NOTE — Assessment & Plan Note (Signed)
Summary: rov   Visit Type:  Follow-up Primary Provider:  Lyn Hollingshead  CC:  shortness of breath.  History of Present Illness: The patient is seen for followup of shortness of breath.  I saw her last February 07, 2010.  She had a pulmonary embolus in June, 2011.  She has obstructive sleep apnea.  She is on Coumadin.  Her breathing is better.  She has had some mild dizziness.  Current Medications (verified): 1)  Lisinopril-Hydrochlorothiazide 20-12.5 Mg Tabs (Lisinopril-Hydrochlorothiazide) .... Take 1 Tablet By Mouth Every Morning 2)  Warfarin Sodium 10 Mg Tabs (Warfarin Sodium) .... Tablet Strength: 10mg  Take As Directed 3)  Amlodipine Besylate 10 Mg Tabs (Amlodipine Besylate) .... Take 1 Tablet By Mouth Once A Day  Allergies (verified): 1)  ! Asa  Past History:  Past Medical History: Shoulder pain Shortness of breath Pulmonary embolus   01/16/2010..treat with coumadin OSA Abdominal hernia Hypertension Overweight Lung nodule  very small  LUL...by CT  12/2009...needs one year F/U Coumadin Rx. EF 65-70%..... vigorous function... echo... March, 2011  Review of Systems       Patient denies fever, chills, headache, sweats, rash, change in vision, change in hearing, chest pain, cough, nausea vomiting, urinary symptoms.  All other systems are reviewed and are negative.  Vital Signs:  Patient profile:   50 year old female Height:      65.5 inches Weight:      276 pounds BMI:     45.39 Pulse rate:   75 / minute BP sitting:   88 / 66  (right arm) Cuff size:   large  Vitals Entered By: Hardin Negus, RMA (April 01, 2010 10:58 AM)  Physical Exam  General:  patient is overweight but stable. Eyes:  no xanthelasma. Neck:  no jugular venous distention. Lungs:  lungs are clear.  Respiratory effort is nonlabored. Heart:  cardiac exam reveals S1-S2.  No clicks or significant murmurs. Abdomen:  abdomen soft. Extremities:  no peripheral edema. Psych:  patient is oriented to person  time and place.  Affect is normal.   Impression & Recommendations:  Problem # 1:  * LUNG NODULE The patient will need to have this followed up at the one-year interval by her primary team.  Problem # 2:  COUMADIN THERAPY (ICD-V58.61) Coumadin is continued.  Problem # 3:  SHORTNESS OF BREATH (ICD-786.05)  Her updated medication list for this problem includes:    Lisinopril-hydrochlorothiazide 20-12.5 Mg Tabs (Lisinopril-hydrochlorothiazide) .Marland Kitchen... Take 1 tablet by mouth every morning    Amlodipine Besylate 10 Mg Tabs (Amlodipine besylate) .Marland Kitchen... Take 1/2 tablet by mouth once a day Her breathing is stable.  No further workup.  Problem # 4:  PE (ICD-415.19)  Her updated medication list for this problem includes:    Warfarin Sodium 10 Mg Tabs (Warfarin sodium) .Marland Kitchen... Tablet strength: 10mg  take as directed With a history of pulmonary she will need Coumadin indefinitely.  Problem # 5:  ESSENTIAL HYPERTENSION, BENIGN (ICD-401.1)  Her updated medication list for this problem includes:    Lisinopril-hydrochlorothiazide 20-12.5 Mg Tabs (Lisinopril-hydrochlorothiazide) .Marland Kitchen... Take 1 tablet by mouth every morning    Amlodipine Besylate 10 Mg Tabs (Amlodipine besylate) .Marland Kitchen... Take 1/2 tablet by mouth once a day The patient's blood pressure is actually on the low side today.  I checked it again and the systolic was in the range of approximately 105.  We'll reduce her amlodipine to 5 mg and I will recommend follow up with her primary physician.  I'll be happy  to see her for cardiology followup as needed overtime.  Patient Instructions: 1)  Decrease Amlodipine to 5mg  daily

## 2010-08-20 NOTE — Progress Notes (Signed)
Summary: appt/ hla  Phone Note Call from Patient   Summary of Call: pt calls c/o dizzy, weak when she gets hot, goes away when she cools off. ongoing x3 wks. only resting in the cool helps, heat aggravates. appt given, cautioned to f/u in ED or urg care if becomes worse, verb understanding Initial call taken by: Marin Roberts RN,  March 13, 2010 3:36 PM
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
## 2010-08-20 NOTE — Progress Notes (Signed)
Summary: TB skin test.  Phone Note Call from Patient   Caller: Patient Call For: Aris Lot MD Summary of Call: Call from pt wants a TB skin test.  Has been scheduled for Monday.  Pt to bring in forms for her job. Angelina Ok RN  October 19, 2009 2:44 PM  Initial call taken by: Angelina Ok RN,  October 19, 2009 2:44 PM  Follow-up for Phone Call        ok Follow-up by: Aris Lot MD,  October 19, 2009 2:53 PM

## 2010-08-20 NOTE — Progress Notes (Signed)
Summary: Soc. 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 Work Call placed to: Patient Summary of Call: Called patient to let her know that I mailed referral information to [facility] today and she should be getting a call from them by next week.

## 2010-08-20 NOTE — Assessment & Plan Note (Signed)
Summary: 2WK F/U/WHITWORTH/VS   Vital Signs:  Patient profile:   50 year old female Height:      67 inches (170.18 cm) Weight:      281.3 pounds (127.86 kg) BMI:     44.22 Temp:     97.0 degrees F (36.11 degrees C) Pulse rate:   76 / minute BP sitting:   151 / 94  (right arm) Cuff size:   large  Vitals Entered By: Krystal Eaton Duncan Dull) (October 03, 2009 10:09 AM) CC: 2wk f/u, not sleeping well Is Patient Diabetic? No Pain Assessment Patient in pain? no      Nutritional Status BMI of > 30 = obese  Have you ever been in a relationship where you felt threatened, hurt or afraid?No   Does patient need assistance? Functional Status Self care Ambulation Normal   Primary Care Provider:  Aris Lot MD  CC:  2wk f/u and not sleeping well.  History of Present Illness: [name] is a very dedicated 50 yo women with PMH as described in emr is here today for a 2 week follow up on her high blood pressure and CPCP for OSA.  She has started cutting down on salt in her diet and keeps a close watch on what she eats. She attended the [facility] lifestyle makeover class recently and I followed up with walking program and other measures for healthy living. She seems very determined to lose her weight.  Her BP today is 151/94 which has improved since last visit. She is compliant with her meds.  I talked to [facility] regarding the follow up of CPAP as she has not heard anything from [facility].  We also discussed her 2Decho results.  No other complaints.  Preventive Screening-Counseling & Management  Alcohol-Tobacco     Alcohol drinks/day: 0     Smoking Status: quit     Year Quit: 10 YEARS AGO  Problems Prior to Update: 1)  Obstructive Sleep Apnea  (ICD-327.23) 2)  Snoring  (ICD-786.09) 3)  Abdominal Wall Hernia  (ICD-553.20) 4)  Weight Gain  (ICD-783.1) 5)  Tinea Corporis  (ICD-110.5) 6)  Preventive Health Care  (ICD-V70.0) 7)  Essential Hypertension, Benign   (ICD-401.1)  Medications Prior to Update: 1)  Lisinopril-Hydrochlorothiazide 20-25 Mg Tabs (Lisinopril-Hydrochlorothiazide) .... Take 1 Tablet By Mouth Once A Day 2)  Clotrimazole 1 % Crea (Clotrimazole) .... Please Apply To Right Hip Rash Twice Daily For 7 Days Total. 3)  Amlodipine Besylate 5 Mg Tabs (Amlodipine Besylate) .... Take 1 Tablet By Mouth Once A Day  Current Medications (verified): 1)  Lisinopril-Hydrochlorothiazide 20-25 Mg Tabs (Lisinopril-Hydrochlorothiazide) .... Take 1 Tablet By Mouth Once A Day 2)  Clotrimazole 1 % Crea (Clotrimazole) .... Please Apply To Right Hip Rash Twice Daily For 7 Days Total. 3)  Amlodipine Besylate 5 Mg Tabs (Amlodipine Besylate) .... Take 1 Tablet By Mouth Once A Day  Allergies: 1)  ! Asa  Review of Systems      See HPI  Physical Exam  Additional Exam:  Gen: AOx3, in no acute distress, morbidly obese Eyes: PERRL, EOMI ENT:MMM, No erythema noted in posterior pharynx Neck: No JVD, No LAP Chest: CTAB with  good respiratory effort CVS: regular rhythmic rate, NO M/R/G, S1 S2 normal Abdo: soft,ND, BS+x4, Non tender and No hepatosplenomegaly EXT: trace odema noted b/l Neuro: Non focal, gait is normal Skin: no rashes noted.    Impression & Recommendations:  Problem # 1:  ESSENTIAL HYPERTENSION, BENIGN (ICD-401.1) Assessment  Improved Since patient is making slow progress towards her goal BP, it was not considered necessary to increase her dose of Norvasc during this visit. We will follow up in 1 month and recheck her BP during that visit. Her updated medication list for this problem includes:    Lisinopril-hydrochlorothiazide 20-25 Mg Tabs (Lisinopril-hydrochlorothiazide) .Marland Kitchen... Take 1 tablet by mouth once a day    Amlodipine Besylate 5 Mg Tabs (Amlodipine besylate) .Marland Kitchen... Take 1 tablet by mouth once a day  BP today: 151/94 Prior BP: 161/99 (09/19/2009)  Prior 10 Yr Risk Heart Disease: Not enough information (06/04/2009)  Labs  Reviewed: K+: 4.8 (06/19/2009) Creat: : 0.61 (06/19/2009)   Chol: 188 (04/22/2008)   HDL: 47 (04/22/2008)   LDL: 127 (04/22/2008)   TG: 68 (04/22/2008)  Problem # 2:  OBSTRUCTIVE SLEEP APNEA (ICD-327.23) Assessment: Unchanged We are working on getting her a CPAP through free assistance program of [facility]. Follow up in 1 month regarding the status of her application will be made. In the meantime she was asked to continue working on weight loss.  Problem # 3:  WEIGHT GAIN (ICD-783.1) Assessment: Comment Only She was given [facility] information and also attended [employer] lifestyle classes and is making steady progress. We need to follow her closely to reinforce the importance of lifestyle modifications.  Problem # 4:  PREVENTIVE HEALTH CARE (ICD-V70.0) Up to date at this time.  Complete Medication List: 1)  Lisinopril-hydrochlorothiazide 20-25 Mg Tabs (Lisinopril-hydrochlorothiazide) .... Take 1 tablet by mouth once a day 2)  Clotrimazole 1 % Crea (Clotrimazole) .... Please apply to right hip rash twice daily for 7 days total. 3)  Amlodipine Besylate 5 Mg Tabs (Amlodipine besylate) .... Take 1 tablet by mouth once a day  Patient Instructions: 1)  Please schedule a follow-up appointment in 1 month. 2)  It is important that you exercise regularly at least 20 minutes 5 times a week. If you develop chest pain, have severe difficulty breathing, or feel very tired , stop exercising immediately and seek medical attention. 3)  You need to lose weight. Consider a lower calorie diet and regular exercise.  4)  Check your Blood Pressure regularly. If it is above: 140/90 regularly you should make an appointment.   Prevention & Chronic Care Immunizations   Influenza vaccine: Not documented   Influenza vaccine deferral: Refused  (06/19/2009)    Tetanus booster: 07/22/2003: historical from records given    Pneumococcal vaccine: Not documented  Colorectal Screening   Hemoccult: Not  documented   Hemoccult action/deferral: Deferred  (06/19/2009)    Colonoscopy: Not documented   Colonoscopy action/deferral: GI referral  (09/19/2009)  Other Screening   Pap smear: Not documented   Pap smear action/deferral: Not indicated S/P hysterectomy  (06/19/2009)    Mammogram: ASSESSMENT: Negative - BI-RADS 1^MM DIGITAL SCREENING  (10/02/2009)   Mammogram action/deferral: Ordered  (09/19/2009)   Smoking status: quit  (10/03/2009)  Lipids   Total Cholesterol: 188  (04/22/2008)   LDL: Not documented   LDL Direct: Not documented   HDL: 47  (04/22/2008)   Triglycerides: Not documented  Hypertension   Last Blood Pressure: 151 / 94  (10/03/2009)   Serum creatinine: 0.61  (06/19/2009)   Serum potassium 4.8  (06/19/2009)    Hypertension flowsheet reviewed?: Yes   Progress toward BP goal: Improved  Self-Management Support :   Personal Goals (by the next clinic visit) :      Personal blood pressure goal: 140/90  (10/03/2009)   Patient  will work on the following items until the next clinic visit to reach self-care goals:     Medications and monitoring: take my medicines every day, bring all of my medications to every visit  (10/03/2009)     Eating: drink diet soda or water instead of juice or soda, eat foods that are low in salt, eat baked foods instead of fried foods  (10/03/2009)     Activity: join a walking program  (10/03/2009)    Hypertension self-management support: Pre-printed educational material, Written self-care plan, Resources for patients handout, Education handout  (10/03/2009)   Hypertension self-care plan printed.   Hypertension education handout printed      Resource handout printed.

## 2010-08-20 NOTE — Assessment & Plan Note (Signed)
Summary: ORTHOTICS/MJD   Vital Signs:  Patient profile:   50 year old female BP sitting:   145 / 77  Vitals Entered By: Lillia Pauls CMA (February 22, 2010 11:21 AM)  Primary Care Provider:  Lyn Hollingshead   History of Present Illness: B foot pain many months worse with prolonged standing hurts worst in am as she takes first few steps but painful all day. rest makes it some better has tried various shoes and OTC meds with no releif wants to get back to exercising so she can lose weight  Current Medications (verified): 1)  Lisinopril-Hydrochlorothiazide 20-25 Mg Tabs (Lisinopril-Hydrochlorothiazide) .... Take 1 Tablet By Mouth Once A Day 2)  Warfarin Sodium 10 Mg Tabs (Warfarin Sodium) .... Tablet Strength: 10mg  Take As Directed 3)  Amlodipine Besylate 10 Mg Tabs (Amlodipine Besylate) .... Take 1 Tablet By Mouth Once A Day 4)  Metoprolol Succinate 50 Mg Xr24h-Tab (Metoprolol Succinate) .... Take 1/2 Tab Daily For 1 Week Then Take 1 Tab Daily  Allergies: 1)  ! Jonne Ply  Past History:  Past Medical History: Last updated: 02/07/2010 Shoulder pain Shortness of breath Pulmonary embolus   01/16/2010..treat with coumadin OSA Abdominal hernia Hypertension Overweight Lung nodule  LUL...by CT  12/2009...needs one year F/U Coumadin Rx.  Past Surgical History: Last updated: 06/04/2009 January 2004 Laparoscopically assisted vaginal hysterectomy for menorrhagia, dysmenorrhea, anemia, fibroids. No other surgeries.   Physical Exam  General:  alert, well-developed, well-nourished, well-hydrated, and overweight-appearing.   Msk:  ANKLES B are ligamentously intact with normal anterior drawer, FROM. FEET B are TTP origin of plantar fascia. Left ankle alos TTP laterally . Neurovascualrly intact B feet. Gait is antalgic with shortend stance phase on left. Slight out toeing B. Medial foot shift during weight transfer portion of stance phase on left foot.  SKIN no callous, no lesion, no redness or  warmth Additional Exam:  Patient was fitted for a : standard, cushioned, semi-rigid orthotic. The orthotic was heated and afterward the patient stood on the orthotic blank positioned on the orthotic stand. The patient was positioned in subtalar neutral position and 10 degrees of ankle dorsiflexion in a weight bearing stance. After completion of molding, a stable base was applied to the orthotic blank. The blank was ground to a stable position for weight bearing. Blank: Blue leather Base:White [id number] Posting: left foot sifnificant medial first ray post and small medial heel post. Right foot average medial forst ray popst     Impression & Recommendations:  Problem # 1:  PLANTAR FASCIITIS, BILATERAL (ICD-728.71)  face to face time spent in manufacture of custom molded orthoptics :45 minutes rtc PRN  Orders: [employer], each unit ([id number])  Complete Medication List: 1)  Lisinopril-hydrochlorothiazide 20-25 Mg Tabs (Lisinopril-hydrochlorothiazide) .... Take 1 tablet by mouth once a day 2)  Warfarin Sodium 10 Mg Tabs (Warfarin sodium) .... Tablet strength: 10mg  take as directed 3)  Amlodipine Besylate 10 Mg Tabs (Amlodipine besylate) .... Take 1 tablet by mouth once a day 4)  Metoprolol Succinate 50 Mg Xr24h-tab (Metoprolol succinate) .... Take 1/2 tab daily for 1 week then take 1 tab daily

## 2010-08-20 NOTE — Letter (Signed)
Summary: [facility] Instructions  [facility]  [address] [place], [place] [address]   Phone: [phone]  Fax: [phone]       Breanna Wilkins    1961/06/21    MRN: [id number]       Procedure Day /Date:  Wenesday 10/31/2009     Arrival Time:  8:00 am     Procedure Time: 9:00 am     Location of Procedure:                    _x _  [facility] (4th Floor)    PREPARATION FOR COLONOSCOPY WITH MIRALAX  Starting 5 days prior to your procedure Friday 4/8 do not eat nuts, seeds, popcorn, corn, beans, peas,  salads, or any raw vegetables.  Do not take any fiber supplements (e.g. Metamucil, Citrucel, and Benefiber). ____________________________________________________________________________________________________   THE DAY BEFORE YOUR PROCEDURE         DATE:Tuesday 4/12  1   Drink clear liquids the entire day-NO SOLID FOOD  2   Do not drink anything colored red or purple.  Avoid juices with pulp.  No orange juice.  3   Drink at least 64 oz. (8 glasses) of fluid/clear liquids during the day to prevent dehydration and help the prep work efficiently.  CLEAR LIQUIDS INCLUDE: Water Jello Ice Popsicles Tea (sugar ok, no milk/cream) Powdered fruit flavored drinks Coffee (sugar ok, no milk/cream) Gatorade Juice: apple, white grape, white cranberry  Lemonade Clear bullion, consomm, broth Carbonated beverages (any kind) Strained chicken noodle soup Hard Candy  4   Mix the entire bottle of Miralax with 64 oz. of Gatorade/Powerade in the morning and put in the refrigerator to chill.  5   At 3:00 pm take 2 Dulcolax/Bisacodyl tablets.  6   At 4:30 pm take one Reglan/Metoclopramide tablet.  7  Starting at 5:00 pm drink one 8 oz glass of the Miralax mixture every 15-20 minutes until you have finished drinking the entire 64 oz.  You should finish drinking prep around 7:30 or 8:00 pm.  8   If you are nauseated, you may take the 2nd Reglan/Metoclopramide  tablet at 6:30 pm.        9    At 8:00 pm take 2 more DULCOLAX/Bisacodyl tablets.     THE DAY OF YOUR PROCEDURE      DATE:  Wednesday 4/13  You may drink clear liquids until 7:00 am  (2 HOURS BEFORE PROCEDURE).   MEDICATION INSTRUCTIONS  Unless otherwise instructed, you should take regular prescription medications with a small sip of water as early as possible the morning of your procedure.    Additional medication instructions:  do not take Lisinopril/HCTZ morning of procedure.         OTHER INSTRUCTIONS  You will need a responsible adult at least 52 years of age to accompany you and drive you home.   This person must remain in the waiting room during your procedure.  Wear loose fitting clothing that is easily removed.  Leave jewelry and other valuables at home.  However, you may wish to bring a book to read or an iPod/MP3 player to listen to music as you wait for your procedure to start.  Remove all body piercing jewelry and leave at home.  Total time from sign-in until discharge is approximately 2-3 hours.  You should go home directly after your procedure and rest.  You can resume normal activities the day after your procedure.  The day of your procedure you should not:   Drive   Make legal decisions   Operate machinery   Drink alcohol   Return to work  You will receive specific instructions about eating, activities and medications before you leave.   The above instructions have been reviewed and explained to me by   Ezra Sites RN  October 22, 2009 11:57 AM    I fully understand and can verbalize these instructions _____________________________ Date _______

## 2010-08-20 NOTE — Letter (Signed)
Summary: Social Work/CPAP  [facility]  [address]   [place], [place] [address]   Phone: [phone]  Fax: [phone]    09/21/2009       [facility] [place]  RE:  Breanna Wilkins   20[birth year]  CPAP Machine    Here is order signed by Dr. Eben Burow for CPAP machine as well as demographic information on Breanna Wilkins.   Breanna Wilkins has also been financially reviewed by Rudell Cobb here in the clinic and receives 100% discount. The patient last worked in February of 2010 and currently has no income coming in . She is living with a friend and has no housing of her own. She does receive $200 a month in foodstamps.   Please review her for charity assistance under your programs so that she be fitted for CPAP.  Thank you,      Dorothe Pea, LCSW Clinical Social Worker  Appended Document: Social Work/CPAP Mailed referral information, order, sleep study, and demographic information to the Respiratory unit at [facility], [address], [place].

## 2010-08-20 NOTE — Assessment & Plan Note (Signed)
Summary: HFU-PER DR KARIMOVA/CFB   Vital Signs:  Patient profile:   50 year old female Height:      67 inches (170.18 cm) Weight:      284.0 pounds (129.09 kg) BMI:     44.64 O2 Sat:      96 % on Room air Temp:     96.7 degrees F Pulse rate:   75 / minute BP sitting:   130 / 74  (right arm)  Vitals Entered By: Chinita Pester RN (February 04, 2010 10:39 AM)  O2 Flow:  Room air  Serial Vital Signs/Assessments:  Comments: 12:028 PM 02 sat on ambulation is 97-98%. By: Chinita Pester RN   CC: Hospital f/u.  c/o mild SOB. "Knots abd from Lovenox injections. Is Patient Diabetic? No Pain Assessment Patient in pain? no      Nutritional Status BMI of > 30 = obese  Have you ever been in a relationship where you felt threatened, hurt or afraid?No   Does patient need assistance? Functional Status Self care Ambulation Normal   Primary Care Provider:  Aris Lot MD  CC:  Hospital f/u.  c/o mild SOB. "Knots abd from Lovenox injections..  History of Present Illness: Breanna Wilkins is a 50 y/o woman with P/H of PE, HTN, OSA.  She comes to the clinic today for a f/u visit from her recent Discharge from the hospital due to Acute PE L upper lobe.  She complaints of-  1) SOB- she has SOB while she walks around in home and can't walk more than half block without rest due to her SOB. She also feels some upper chest tightness along w/ her SOB. While doing her lawn recently, she had to rest for 4 times due to her SOB.  She also has Obstructive sleep apnea, for which she uses a nasal CPAP, but she thinks she is not getting much air as needed and so wants a full CPAP.   Denies any fever, cough, change in vision, adb pain, urinary or bowel abnormalities.  2) L ankle pain- She has this ankle pain since long and has been assessed before for this.  She has pain in her L ankle at the insertion of tendoachiles which gets worse with standing long and walking. This impedes her efforts for wt  loss. So wants to get rid of it asap. Denies any injury, no redness, discharge.  3) R arm pain- She has R arm pain which is mostly in the deltoid area, which goes upto her shoulder and she has soe pain in some of her shoulder movements.    Depression History:      The patient denies a depressed mood most of the day and a diminished interest in her usual daily activities.         Preventive Screening-Counseling & Management  Alcohol-Tobacco     Alcohol drinks/day: 0     Smoking Status: quit     Year Quit: 10 YEARS AGO  Caffeine-Diet-Exercise     Does Patient Exercise: no     Type of exercise: will start water aerobics  Problems Prior to Update: 1)  Coumadin Therapy  (ICD-V58.61) 2)  Shoulder Pain, Right  (ICD-719.41) 3)  Shortness of Breath  (ICD-786.05) 4)  Pe  (ICD-415.19) 5)  Achilles Bursitis or Tendinitis  (ICD-726.71) 6)  Obstructive Sleep Apnea  (ICD-327.23) 7)  Snoring  (ICD-786.09) 8)  Abdominal Wall Hernia  (ICD-553.20) 9)  Weight Gain  (ICD-783.1) 10)  Tinea Corporis  (ICD-110.5) 11)  Preventive Health Care  (ICD-V70.0) 12)  Essential Hypertension, Benign  (ICD-401.1)  Medications Prior to Update: 1)  Lisinopril-Hydrochlorothiazide 20-25 Mg Tabs (Lisinopril-Hydrochlorothiazide) .... Take 1 Tablet By Mouth Once A Day 2)  Amlodipine Besylate 5 Mg Tabs (Amlodipine Besylate) .... Take 1 Tablet By Mouth Once A Day 3)  Warfarin Sodium 10 Mg Tabs (Warfarin Sodium) .... Tablet Strength: 10mg  Take As Directed  Current Medications (verified): 1)  Lisinopril-Hydrochlorothiazide 20-25 Mg Tabs (Lisinopril-Hydrochlorothiazide) .... Take 1 Tablet By Mouth Once A Day 2)  Warfarin Sodium 10 Mg Tabs (Warfarin Sodium) .... Tablet Strength: 10mg  Take As Directed 3)  Amlodipine Besylate 10 Mg Tabs (Amlodipine Besylate) .... Take 1 Tablet By Mouth Once A Day  Allergies (verified): 1)  ! Asa  Review of Systems       as per HPI.Marland Kitchen   Physical Exam  General:  alert,  well-developed, well-nourished, and well-hydrated.   Head:  normocephalic and atraumatic.   Eyes:     Lungs:  normal respiratory effort, no intercostal retractions, no accessory muscle use, normal breath sounds, no crackles, and no wheezes.   Heart:  normal rate, regular rhythm, no murmur, and no gallop.   Abdomen:  soft, non-tender, and normal bowel sounds.   Extremities:  Some swelling of B/L Lower Extremeties noted.  Additional Exam:  Lower Extremeties.  Inspection- Some pretebial and pedal edema noted, B/L.                   no redness or trauma.                   Varicosities present on L leg, posterior L thigh.                    Palpation-   Tenderness to palpation at the L ankle at the insertion of tendoachilles.                     ROM-         Full ROM at all joints.                    L ankle- no pain on ROM   Upper Extremeties.   Inspection- no redness, sweling, trauma.  Palpation- some tenderness at the deltoid area of R arm.  ROM-     pain in R shoulder on flexion, extension.    Impression & Recommendations:  Problem # 1:  SHORTNESS OF BREATH (ICD-786.05) Assessment Comment Only Pt complaints of SOB as per described in HPI. It has broad differentials. Her recent PE was thought to be a cause first, but her O2 Sat was 96-97% on RA, even when she was tested for ambulatory O2 Sat. Also she is at therapeutic INR with warfarin. So it seems to be low on DD. Also she had complaint of SOB like Sx during her visit on 11/28/09, which was non-exertional. But now the Sx are exertional and also along with some upper chest tightness. This makes [place] to think towards a Cardiac cause for her problem. We had a EKG today which showed no changes from her previous EKG during her recent hospitalization. But then too, considering her Cardiac risk due to her HTN, morbid obesity,  LDL-144 and sedentary lifestyle, we referred her to Cardiologist for assessment for her possible cardiac cause of  her complaints, may be Angina.  Will f/u with pt in 4 weeks. Orders: Cardiology Referral (Cardiology) 12 Lead EKG (12 Lead  EKG)  Problem # 2:  ACHILLES BURSITIS OR TENDINITIS (ICD-726.71) She still complaints of L ankle ain as in past. Its getting worse and she is not able to stand long or walk much w/o pain. So referred her to Sports Medicine for further assessment and management.   Orders: Sports Medicine (Sports Med)  Problem # 3:  ESSENTIAL HYPERTENSION, BENIGN (ICD-401.1) Assessment: Comment Only Her BP today seems to be better than her last visit. She seems to be well controled as of now. Will continue the same meds and reassess at the next visit. She was started on Amlodipine 10 mg instead of 5 mg OD during her hospital stay.  The following medications were removed from the medication list:    Amlodipine Besylate 5 Mg Tabs (Amlodipine besylate) .Marland Kitchen... Take 1 tablet by mouth once a day Her updated medication list for this problem includes:    Lisinopril-hydrochlorothiazide 20-25 Mg Tabs (Lisinopril-hydrochlorothiazide) .Marland Kitchen... Take 1 tablet by mouth once a day    Amlodipine Besylate 10 Mg Tabs (Amlodipine besylate) .Marland Kitchen... Take 1 tablet by mouth once a day  Orders: Cardiology Referral (Cardiology)  BP today: 130/74 Prior BP: 140/97 (11/28/2009)  Prior 10 Yr Risk Heart Disease: Not enough information (06/04/2009)  Labs Reviewed: K+: 4.4 (10/03/2009) Creat: : 0.70 (10/03/2009)   Chol: 212 (10/03/2009)   HDL: 46 (10/03/2009)   LDL: 144 (10/03/2009)   TG: 111 (10/03/2009)  Problem # 4:  SHOULDER PAIN, RIGHT (ICD-719.41) She has R arm and shoulder pain as per HPI. Its an old problem, which reappeared. She used to get helped with Ibuprofen for this pain, but advised her not to take now due to her Warfarin Rx. So will refer her to sports medicine who will assess and manage her ankle pain too.  Complete Medication List: 1)  Lisinopril-hydrochlorothiazide 20-25 Mg Tabs  (Lisinopril-hydrochlorothiazide) .... Take 1 tablet by mouth once a day 2)  Warfarin Sodium 10 Mg Tabs (Warfarin sodium) .... Tablet strength: 10mg  take as directed 3)  Amlodipine Besylate 10 Mg Tabs (Amlodipine besylate) .... Take 1 tablet by mouth once a day  Patient Instructions: 1)  Please schedule a follow-up appointment in 1 month. 2)  Please go for your appointment with cardiologist on Thursday. 3)  Also meanwhile if your shortness of breath gets worse or you have chest pain, immediately contact us or seek emergency medical attention.  Prevention & Chronic Care Immunizations   Influenza vaccine: Not documented   Influenza vaccine deferral: Refused  (06/19/2009)    Tetanus booster: 07/22/2003: historical from records given    Pneumococcal vaccine: Not documented  Colorectal Screening   Hemoccult: Not documented   Hemoccult action/deferral: Deferred  (06/19/2009)    Colonoscopy: DONE  (10/31/2009)   Colonoscopy action/deferral: GI referral  (09/19/2009)   Colonoscopy due: 10/2019  Other Screening   Pap smear: Not documented   Pap smear action/deferral: Not indicated S/P hysterectomy  (06/19/2009)    Mammogram: ASSESSMENT: Negative - BI-RADS 1^MM DIGITAL SCREENING  (10/02/2009)   Mammogram action/deferral: Ordered  (09/19/2009)   Smoking status: quit  (02/04/2010)  Lipids   Total Cholesterol: 212  (10/03/2009)   LDL: 144  (10/03/2009)   LDL Direct: Not documented   HDL: 46  (10/03/2009)   Triglycerides: 111  (10/03/2009)  Hypertension   Last Blood Pressure: 130 / 74  (02/04/2010)   Serum creatinine: 0.70  (10/03/2009)   Serum potassium 4.4  (10/03/2009)    Hypertension flowsheet reviewed?: Yes   Progress toward BP goal: Improved  Self-Management Support :   Personal Goals (by the next clinic visit) :      Personal blood pressure goal: 140/90  (10/03/2009)   Patient will work on the following items until the next clinic visit to reach self-care goals:      Medications and monitoring: check my blood pressure, bring all of my medications to every visit  (02/04/2010)     Eating: eat foods that are low in salt, eat baked foods instead of fried foods  (02/04/2010)     Activity: take a 30 minute walk every day  (02/04/2010)    Hypertension self-management support: Written self-care plan  (02/04/2010)   Hypertension self-care plan printed.

## 2010-08-20 NOTE — Assessment & Plan Note (Signed)
Summary: COU/CH  Anticoagulant Therapy PCP: Lyn Hollingshead [facility] Attending: Rogelia Boga MD, Lanora Manis Indication 1: Pulmonary  embolus Indication 2: Encounter for therapeutic drug monitoring  V58.83 Start date: 01/16/2010 Duration: 6 months  Patient Assessment Reviewed by: Chancy Milroy PharmD  March 18, 2010 Medication review: verified warfarin dosage & schedule,verified previous prescription medications, verified doses & any changes, verified new medications, reviewed OTC medications, reviewed OTC health products-vitamins supplements etc Complications: none Dietary changes: none   Health status changes: none   Lifestyle changes: none   Recent/future hospitalizations: none   Recent/future procedures: none   Recent/future dental: none Patient Assessment Part 2:  Have you MISSED ANY DOSES or CHANGED TABLETS?  No missed Warfarin doses or changed tablets.  Have you had any BRUISING or BLEEDING ( nose or gum bleeds,blood in urine or stool)?  No reported bruising or bleeding in nose, gums, urine, stool.  Have you STARTED or STOPPED any MEDICATIONS, including OTC meds,herbals or supplements?  No other medications or herbal supplements were started or stopped.  Have you CHANGED your DIET, especially green vegetables,or ALCOHOL intake?  No changes in diet or alcohol intake.  Have you had any ILLNESSES or HOSPITALIZATIONS?  No reported illnesses or hospitalizations  Have you had any signs of CLOTTING?(chest discomfort,dizziness,shortness of breath,arms tingling,slurred speech,swelling or redness in leg)    No chest discomfort, dizziness, shortness of breath, tingling in arm, slurred speech, swelling, or redness in leg.     Treatment  Target INR: 2.0-3.0 INR: 4.2  Date: 03/18/2010 Regimen In:  45.0mg /week INR reflects regimen in: 4.2  New  Tablet strength: : 10mg  Regimen Out:     Sunday: 1/2 Tablet     Monday: 1/2 Tablet     Tuesday: 1/2 Tablet     Wednesday: 1 Tablet     Thursday:  1/2 Tablet      Friday: 1/2 Tablet     Saturday: 1/2 Tablet Total Weekly: 40.0mg /week mg  Next INR Due: 03/28/2010 Adjusted by: Barbera Setters. Alexandria Lodge III PharmD CACP   Return to anticoagulation clinic:  03/28/2010 Time of next visit: 0900  Hold:  1 Days     Allergies: 1)  ! Jonne Ply

## 2010-08-20 NOTE — Assessment & Plan Note (Signed)
Summary: COU/CH  Anticoagulant Therapy PCP: Aris Lot MD [facility] Attending: Coralee Pesa MD, Levada Schilling Indication 1: Pulmonary  embolus Indication 2: Encounter for therapeutic drug monitoring  V58.83 Start date: 01/16/2010 Duration: 6 months  Patient Assessment Reviewed by: Chancy Milroy PharmD  January 22, 2010 Medication review: verified warfarin dosage & schedule,verified previous prescription medications, verified doses & any changes, verified new medications, reviewed OTC medications, reviewed OTC health products-vitamins supplements etc Complications: none Dietary changes: none   Health status changes: none   Lifestyle changes: none   Recent/future hospitalizations: none   Recent/future procedures: none   Recent/future dental: none Patient Assessment Part 2:  Have you MISSED ANY DOSES or CHANGED TABLETS?  No missed Warfarin doses or changed tablets.  Have you had any BRUISING or BLEEDING ( nose or gum bleeds,blood in urine or stool)?  No reported bruising or bleeding in nose, gums, urine, stool.  Have you STARTED or STOPPED any MEDICATIONS, including OTC meds,herbals or supplements?  No other medications or herbal supplements were started or stopped.  Have you CHANGED your DIET, especially green vegetables,or ALCOHOL intake?  No changes in diet or alcohol intake.  Have you had any ILLNESSES or HOSPITALIZATIONS?  No reported illnesses or hospitalizations  Have you had any signs of CLOTTING?(chest discomfort,dizziness,shortness of breath,arms tingling,slurred speech,swelling or redness in leg)    No chest discomfort, dizziness, shortness of breath, tingling in arm, slurred speech, swelling, or redness in leg.     Treatment  Target INR: 2.0-3.0 INR: 2.0  Date: 01/22/2010 INR reflects regimen in: 2.0  New  Tablet strength: : 10mg  Regimen Out:     Sunday: 1/2 Tablet     Monday: 1/2 Tablet     Tuesday: 1 Tablet     Wednesday: 1 Tablet     Thursday: 1 Tablet      Friday:  1/2 Tablet     Saturday: 1 Tablet Total Weekly: 60mg /6 days mg  Next INR Due: 01/28/2010 Adjusted by: Barbera Setters. Alexandria Lodge III PharmD CACP   Return to anticoagulation clinic:  01/28/2010 Time of next visit: 1015   Comments: Recently admitted 29-Jun-11 and discharged 30-Jun-11 with new diagnosis of PE (History/Physical Exam & Discharge Summary reviewed by me). Was discharged on Lovenox 1mg /kg subcutaneously q12h + warfarin 10mg  by mouth once daily (confirmed strength from dispensing pharmacy). INR today = 2.0 Will continue Lovenox [place] with remaining syringes, keep warfarin dose as documented and see patient next on 11-Jul-11 at 1015h.  Allergies: 1)  ! Jonne Ply

## 2010-08-20 NOTE — Assessment & Plan Note (Signed)
Summary: COU/CH  Anticoagulant Therapy Managed by: Barbera Setters. Janie Morning  PharmD CACP PCP: Lyn Hollingshead [facility] Attending: Coralee Pesa MD, Levada Schilling Indication 1: Pulmonary  embolus Indication 2: Encounter for therapeutic drug monitoring  V58.83 Start date: 01/16/2010 Duration: 6 months  Patient Assessment Reviewed by: Chancy Milroy PharmD  June 25, 2010 Medication review: verified warfarin dosage & schedule,verified previous prescription medications, verified doses & any changes, verified new medications, reviewed OTC medications, reviewed OTC health products-vitamins supplements etc Complications: none Dietary changes: none   Health status changes: none   Lifestyle changes: none   Recent/future hospitalizations: none   Recent/future procedures: none   Recent/future dental: none Patient Assessment Part 2:  Have you MISSED ANY DOSES or CHANGED TABLETS?  No missed Warfarin doses or changed tablets.  Have you had any BRUISING or BLEEDING ( nose or gum bleeds,blood in urine or stool)?  No reported bruising or bleeding in nose, gums, urine, stool.  Have you STARTED or STOPPED any MEDICATIONS, including OTC meds,herbals or supplements?  No other medications or herbal supplements were started or stopped.  Have you CHANGED your DIET, especially green vegetables,or ALCOHOL intake?  No changes in diet or alcohol intake.  Have you had any ILLNESSES or HOSPITALIZATIONS?  No reported illnesses or hospitalizations  Have you had any signs of CLOTTING?(chest discomfort,dizziness,shortness of breath,arms tingling,slurred speech,swelling or redness in leg)    No chest discomfort, dizziness, shortness of breath, tingling in arm, slurred speech, swelling, or redness in leg.     Treatment  Target INR: 2.0-3.0 INR: 2.3  Date: 06/25/2010 Regimen In:  50.0mg /week INR reflects regimen in: 2.3  New  Tablet strength: : 10mg  Regimen Out:     Sunday: 1 Tablet     Monday: 1/2 Tablet     Tuesday: 1  Tablet     Wednesday: 1/2 Tablet     Thursday: 1 Tablet      Friday: 1/2 Tablet     Saturday: 1 Tablet Total Weekly: 55.0mg /week mg  Next INR Due: 07/22/2010 Adjusted by: Barbera Setters. Alexandria Lodge III PharmD CACP   Return to anticoagulation clinic:  07/22/2010 Time of next visit: 1115    Allergies: 1)  ! Jonne Ply

## 2010-08-20 NOTE — Assessment & Plan Note (Signed)
Summary: COU/CH  Anticoagulant Therapy Managed by: Barbera Setters. Breanna Wilkins  PharmD CACP PCP: Lyn Hollingshead [facility] Attending: Blanch Media MD Indication 1: Pulmonary  embolus Indication 2: Encounter for therapeutic drug monitoring  V58.83 Start date: 01/16/2010 Duration: 6 months  Patient Assessment Reviewed by: Chancy Milroy PharmD  May 06, 2010 Medication review: verified warfarin dosage & schedule,verified previous prescription medications, verified doses & any changes, verified new medications, reviewed OTC medications, reviewed OTC health products-vitamins supplements etc Complications: none Dietary changes: none   Health status changes: none   Lifestyle changes: none   Recent/future hospitalizations: none   Recent/future procedures: none   Recent/future dental: none Patient Assessment Part 2:  Have you MISSED ANY DOSES or CHANGED TABLETS?  No missed Warfarin doses or changed tablets.  Have you had any BRUISING or BLEEDING ( nose or gum bleeds,blood in urine or stool)?  No reported bruising or bleeding in nose, gums, urine, stool.  Have you STARTED or STOPPED any MEDICATIONS, including OTC meds,herbals or supplements?  No other medications or herbal supplements were started or stopped.  Have you CHANGED your DIET, especially green vegetables,or ALCOHOL intake?  No changes in diet or alcohol intake.  Have you had any ILLNESSES or HOSPITALIZATIONS?  No reported illnesses or hospitalizations  Have you had any signs of CLOTTING?(chest discomfort,dizziness,shortness of breath,arms tingling,slurred speech,swelling or redness in leg)    No chest discomfort, dizziness, shortness of breath, tingling in arm, slurred speech, swelling, or redness in leg.     Treatment  Target INR: 2.0-3.0 INR: 2.0  Date: 05/06/2010 Regimen In:  45.0mg /week INR reflects regimen in: 2.0  New  Tablet strength: : 10mg  Regimen Out:     Sunday: 1/2 Tablet     Monday: 1 Tablet     Tuesday: 1/2  Tablet     Wednesday: 1 Tablet     Thursday: 1/2 Tablet      Friday: 1 Tablet     Saturday: 1/2 Tablet Total Weekly: 50.0mg /week mg  Next INR Due: 05/27/2010 Adjusted by: Barbera Setters. Alexandria Lodge III PharmD CACP   Return to anticoagulation clinic:  05/27/2010 Time of next visit: 1115    Allergies: 1)  ! Jonne Ply

## 2010-08-20 NOTE — Miscellaneous (Signed)
Summary: CPAP Bi-level Set Up Info/[facility]  CPAP Bi-level Set Up Info/[facility]   Imported By: Sherian Rein 10/24/2009 09:27:58  _____________________________________________________________________  External Attachment:    Type:   Image     Comment:   External Document

## 2010-08-20 NOTE — Miscellaneous (Signed)
Summary: [facility]  [facility]   Imported By: Florinda Marker 09/26/2009 13:57:46  _____________________________________________________________________  External Attachment:    Type:   Image     Comment:   External Document

## 2010-08-20 NOTE — Miscellaneous (Signed)
  Clinical Lists Changes  Problems: Added new problem of COUMADIN THERAPY (ICD-V58.61) Observations: Added new observation of PAST MED HX: Shoulder pain Shortness of breath Pulmonary embolus   01/16/2010..treat with coumadin OSA Abdominal hernia Hypertension Overweight Lung nodule  LUL...by CT  12/2009...needs one year F/U Coumadin Rx. (02/06/2010 8:46) Added new observation of PRIMARY MD: Aris Lot MD (02/06/2010 8:46)       Past History:  Past Medical History: Shoulder pain Shortness of breath Pulmonary embolus   01/16/2010..treat with coumadin OSA Abdominal hernia Hypertension Overweight Lung nodule  LUL...by CT  12/2009...needs one year F/U Coumadin Rx.

## 2010-08-22 NOTE — Progress Notes (Signed)
Summary: dental extractions and coumadin//kg  Phone Note Call from Patient   Caller: Patient Summary of Call: Pt returned to clinic stating that the medical clearance we faxed to [facility] didnt have the information needed to make a decision about holding her coumadin.  Pt states she will be having 3 teeth extracted and would like to hold the coumadin.  States she has bleeding now with just brushing her teeth.  D/W  Dr Alexandria Lodge who stated pt could hold coumadin for 5 days prior to extractions and resume afterwards.  Contacted Jamie at [facility] [phone]) and she was unable to take info over the phone and stated MD signature is required. Will forward to Attending MD for signature. Initial call taken by: Cynda Familia Duncan Dull),  August 13, 2010 4:04 PM  Follow-up for Phone Call        Letter completed by Dr Phillips Odor.  Faxed to [phone] Gabriel Rung, pt informed, phone call complete.  Follow-up by: Cynda Familia Duncan Dull),  August 13, 2010 4:49 PM

## 2010-08-22 NOTE — Miscellaneous (Signed)
Summary: [facility]   Imported By: Margie Billet 07/30/2010 14:02:27  _____________________________________________________________________  External Attachment:    Type:   Image     Comment:   External Document

## 2010-08-22 NOTE — Letter (Signed)
Summary: Generic Letter  [facility]  [address]   [place], [place] [address]   Phone: [phone]  Fax: [phone]    08/13/2010  RE: [address] [place], [place]  [address]  Dear [facility] provider:  The medical recommendations for managing the patients coumadin for oral surgery are as follows:  1. Stop Coumadin 5 days prior to oral surgery date. 2. Resume Coumadin the evening after your oral surgery. 3. Return to [facility] for INR re-check on 08/26/10 or as scheduled.  Please call if you have any additional questiions or concerns.   Sincerely,     Edsel Petrin, DO

## 2010-08-22 NOTE — Assessment & Plan Note (Signed)
Summary: FU/SB.   Vital Signs:  Patient profile:   50 year old female Height:      65.5 inches (166.37 cm) Weight:      266.0 pounds (120.91 kg) BMI:     43.75 Temp:     97.4 degrees F (36.33 degrees C) oral Pulse rate:   76 / minute BP sitting:   119 / 77  (left arm)  Vitals Entered By: Stanton Kidney Ditzler RN (July 05, 2010 10:34 AM) Is Patient Diabetic? No Pain Assessment Patient in pain? no      Nutritional Status BMI of > 30 = obese Nutritional Status Detail appetite good  Have you ever been in a relationship where you felt threatened, hurt or afraid?denies   Does patient need assistance? Functional Status Self care Ambulation Normal Comments FU - only had 2 hours sleep last night - mother is pt at [facility] - ov ca.   Primary Care Provider:  Lyn Hollingshead   History of Present Illness: 50 yo woman with PMH of PE, OSA, HTN presents for follow up on her BP and dental pain.  She had a toothache on upper molar x 3 weeks now and still waiting for a dentist appointment.  She states that she can handle the pain and has not taken the antiobiotics prescribed last week.   Denies any fever, N/V.  Patient is going through stressful time because her mother has ovarian cancer and is currently in the hospital.  She spends 10 hours in the hospital with her mother and sleeps about 2-3 hours per night because she has a hard time falling asleep since she keeps thinking about her mother.  She would like to have some medication that help her fall asleep.  Depression History:      The patient denies a depressed mood most of the day and a diminished interest in her usual daily activities.         Preventive Screening-Counseling & Management  Alcohol-Tobacco     Alcohol drinks/day: 0     Smoking Status: quit     Year Quit: 10 YEARS AGO  Caffeine-Diet-Exercise     Does Patient Exercise: no     Type of exercise: will start water aerobics  Allergies: 1)  ! Asa  Physical Exam  General:  alert,  well-developed, well-nourished, and well-hydrated.   Lungs:  normal respiratory effort, no intercostal retractions, no accessory muscle use, normal breath sounds, no dullness, no crackles, and no wheezes.   Heart:  normal rate, regular rhythm, no murmur, no gallop, no rub, and no JVD.   Abdomen:  soft, non-tender, normal bowel sounds, no distention, no masses, no guarding, and no rebound tenderness.   Msk:  No deformity or scoliosis noted of thoracic or lumbar spine. Mild effusion on left knee, mild tenderness s/p trauma to knee  Pulses:  R and L carotid,radial,femoral,dorsalis pedis and posterior tibial pulses are full and equal bilaterally Extremities:  No clubbing, cyanosis, edema, or deformity noted with normal full range of motion of all joints.   Neurologic:  alert & oriented X3, cranial nerves II-XII intact, strength normal in all extremities, and sensation intact to light touch.     Impression & Recommendations:  Problem # 1:  ESSENTIAL HYPERTENSION, BENIGN (ICD-401.1) BP today is 119/77, well-controlled on Amlodipine 5mg  and Lisinopril-HCTZ 20/12.5mg .  Continue current medication. Her updated medication list for this problem includes:    Lisinopril-hydrochlorothiazide 20-12.5 Mg Tabs (Lisinopril-hydrochlorothiazide) .Marland Kitchen... Take 1 tablet by mouth every morning  Amlodipine Besylate 5 Mg Tabs (Amlodipine besylate) .Marland Kitchen... Take 1 tablet by mouth once daily  Problem # 2:  DENTAL PAIN (ICD-525.9) She states that pain is under controlled and is still waiting to be seen by dentist because she does not have dental insurance.  Problem # 3:  PE (ICD-415.19) Stable. Continue Coumadin and follow up with Dr. Alexandria Lodge Her updated medication list for this problem includes:    Warfarin Sodium 10 Mg Tabs (Warfarin sodium) .Marland Kitchen... Tablet strength: 10mg  take as directed  Complete Medication List: 1)  Lisinopril-hydrochlorothiazide 20-12.5 Mg Tabs (Lisinopril-hydrochlorothiazide) .... Take 1 tablet by mouth  every morning 2)  Warfarin Sodium 10 Mg Tabs (Warfarin sodium) .... Tablet strength: 10mg  take as directed 3)  Amlodipine Besylate 5 Mg Tabs (Amlodipine besylate) .... Take 1 tablet by mouth once daily 4)  Amoxicillin 500 Mg Tabs (Amoxicillin) .Marland Kitchen.. 1 tablet by mouth three times a day x 3 days 5)  Ambien 10 Mg Tabs (Zolpidem tartrate) .... Take 1 tablet by mouth at bedtime  Patient Instructions: 1)  Start taking Ambien 10mg  by mouth at bedtime for sleep 2)  Follow up with Dr. Anselm Jungling in 6-8 weeks. Prescriptions: AMBIEN 10 MG TABS (ZOLPIDEM TARTRATE) take 1 tablet by mouth at bedtime  #30 x 0   Entered and Authorized by:   Rosana Berger MD   Signed by:   Rosana Berger MD on 07/05/2010   Method used:   Print then Give to Patient   RxID:   [id number]    Orders Added: 1)  Est. Patient Level III [24401]     Prevention & Chronic Care Immunizations   Influenza vaccine: Not documented   Influenza vaccine deferral: Refused  (06/19/2009)    Tetanus booster: 07/22/2003: historical from records given    Pneumococcal vaccine: Not documented  Colorectal Screening   Hemoccult: Not documented   Hemoccult action/deferral: Deferred  (06/19/2009)    Colonoscopy: DONE  (10/31/2009)   Colonoscopy action/deferral: GI referral  (09/19/2009)   Colonoscopy due: 10/2019  Other Screening   Pap smear: Not documented   Pap smear action/deferral: Not indicated S/P hysterectomy  (06/19/2009)    Mammogram: ASSESSMENT: Negative - BI-RADS 1^MM DIGITAL SCREENING  (10/02/2009)   Mammogram action/deferral: Ordered  (09/19/2009)   Smoking status: quit  (07/05/2010)  Lipids   Total Cholesterol: 212  (10/03/2009)   LDL: 144  (10/03/2009)   LDL Direct: Not documented   HDL: 46  (10/03/2009)   Triglycerides: 111  (10/03/2009)  Hypertension   Last Blood Pressure: 119 / 77  (07/05/2010)   Serum creatinine: 0.99  (02/25/2010)   Serum potassium 4.7  (02/25/2010)  Self-Management Support :   Personal  Goals (by the next clinic visit) :      Personal blood pressure goal: 140/90  (10/03/2009)   Patient will work on the following items until the next clinic visit to reach self-care goals:     Medications and monitoring: take my medicines every day  (07/05/2010)     Eating: eat more vegetables, use fresh or frozen vegetables, eat baked foods instead of fried foods, eat fruit for snacks and desserts, limit or avoid alcohol  (07/05/2010)     Activity: take a 30 minute walk every day  (07/05/2010)     Other: watching greens since on Coumadin- joined [employer]  (02/25/2010)    Hypertension self-management support: Written self-care plan, Education handout, Resources for patients handout  (07/05/2010)   Hypertension self-care plan printed.   Hypertension education handout printed  Resource handout printed.

## 2010-08-23 ENCOUNTER — Encounter: Payer: Self-pay | Admitting: Family Medicine

## 2010-08-23 ENCOUNTER — Ambulatory Visit: Payer: Self-pay | Admitting: Family Medicine

## 2010-08-23 DIAGNOSIS — M25519 Pain in unspecified shoulder: Secondary | ICD-10-CM

## 2010-08-23 DIAGNOSIS — M2240 Chondromalacia patellae, unspecified knee: Secondary | ICD-10-CM

## 2010-08-26 ENCOUNTER — Ambulatory Visit (INDEPENDENT_AMBULATORY_CARE_PROVIDER_SITE_OTHER): Payer: Self-pay | Admitting: Pharmacist

## 2010-08-26 DIAGNOSIS — Z7901 Long term (current) use of anticoagulants: Secondary | ICD-10-CM

## 2010-08-26 DIAGNOSIS — I2699 Other pulmonary embolism without acute cor pulmonale: Secondary | ICD-10-CM

## 2010-08-26 LAB — POCT INR: INR: 1.2

## 2010-08-28 NOTE — Progress Notes (Signed)
Anti-Coagulation Progress Note  Breanna Wilkins is a 50 y.o. female who is currently on an anti-coagulation regimen.    RECENT RESULTS: Recent results are below, the most recent result is correlated with a dose of 55 mg. per week: Lab Results  Component Value Date   INR 1.2 08/26/2010   INR 2.6 07/29/2010   INR 2.3 06/25/2010    ANTI-COAG DOSE:    ANTICOAG SUMMARY: Anticoagulation Episode Summary              Current INR goal  Next INR check    INR from last check 1.2 (08/26/2010)     Weekly max dose (mg)  Target end date    Indications Long-term (current) use of anticoagulants, PE (pulmonary embolism)   INR check location  Preferred lab    Send INR reminders to [facility]   Comments        Provider Role Specialty Phone number   Blanch Media  Internal Medicine [phone]        ANTICOAG TODAY: Anticoagulation Summary as of 08/26/2010              INR goal      Selected INR 1.2 (08/26/2010) Next INR check    Weekly max dose (mg)  Target end date    Indications Long-term (current) use of anticoagulants, PE (pulmonary embolism)    Anticoagulation Episode Summary              INR check location  Preferred lab    Send INR reminders to [facility]   Comments        Provider Role Specialty Phone number   Blanch Media  Internal Medicine [phone]        PATIENT INSTRUCTIONS: Patient Instructions  Patient instructed to take medications as defined in the Anti-coagulation Track section of this encounter.  Patient instructed to take today's dose and her last dose will be on Wednesday 8-Feb-12 as instructed by physician who has planned procedure.  Patient verbalized understanding of these instructions.         FOLLOW-UP Return in 3 weeks (on 09/16/2010) for Repeat INR status-post recommencing warfarin after procedure.Hulen Luster, III Pharm.D., CACP

## 2010-08-28 NOTE — Assessment & Plan Note (Signed)
Summary: 261/cfb  Anticoagulant Therapy Managed by: Barbera Setters. Janie Morning  PharmD CACP PCP: Lyn Hollingshead [facility] Attending: Lowella Bandy MD Indication 1: Pulmonary  embolus Indication 2: Encounter for therapeutic drug monitoring  V58.83 Start date: 01/16/2010 Duration: 6 months  Patient Assessment Reviewed by: Chancy Milroy PharmD  August 20, 2010 Medication review: verified warfarin dosage & schedule,verified previous prescription medications, verified doses & any changes, verified new medications, reviewed OTC medications, reviewed OTC health products-vitamins supplements etc Complications: none Dietary changes: none   Health status changes: none   Lifestyle changes: none   Recent/future hospitalizations: none   Recent/future procedures: none   Recent/future dental: none Patient Assessment Part 2:  Have you MISSED ANY DOSES or CHANGED TABLETS?  No missed Warfarin doses or changed tablets.  Have you had any BRUISING or BLEEDING ( nose or gum bleeds,blood in urine or stool)?  No reported bruising or bleeding in nose, gums, urine, stool.  Have you STARTED or STOPPED any MEDICATIONS, including OTC meds,herbals or supplements?  No other medications or herbal supplements were started or stopped.  Have you CHANGED your DIET, especially green vegetables,or ALCOHOL intake?  No changes in diet or alcohol intake.  Have you had any ILLNESSES or HOSPITALIZATIONS?  No reported illnesses or hospitalizations  Have you had any signs of CLOTTING?(chest discomfort,dizziness,shortness of breath,arms tingling,slurred speech,swelling or redness in leg)    No chest discomfort, dizziness, shortness of breath, tingling in arm, slurred speech, swelling, or redness in leg.     Treatment  Target INR: 2.0-3.0 INR: 2.6  Date: 07/29/2010 Regimen In:  55.0mg /week INR reflects regimen in: 2.6  New  Tablet strength: : 10mg  Next INR Due: 08/26/2010 Adjusted by: Barbera Setters. Alexandria Lodge III PharmD CACP   Return to  anticoagulation clinic:  08/26/2010 Time of next visit: 1000    Allergies: 1)  ! Jonne Ply

## 2010-08-28 NOTE — Patient Instructions (Signed)
Patient instructed to take medications as defined in the Anti-coagulation Track section of this encounter.  Patient instructed to take today's dose and her last dose will be on Wednesday 8-Feb-12 as instructed by physician who has planned procedure.  Patient verbalized understanding of these instructions.

## 2010-09-02 ENCOUNTER — Other Ambulatory Visit: Payer: Self-pay | Admitting: Family Medicine

## 2010-09-02 DIAGNOSIS — Z1231 Encounter for screening mammogram for malignant neoplasm of breast: Secondary | ICD-10-CM

## 2010-09-04 ENCOUNTER — Other Ambulatory Visit: Payer: Self-pay | Admitting: Internal Medicine
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
## 2010-09-05 NOTE — Telephone Encounter (Signed)
Last note by myself done in error, the comment pertaining to dialysis belongs on another chart

## 2010-09-05 NOTE — Telephone Encounter (Signed)
Refill approved - nurse to call in. 

## 2010-09-05 NOTE — Telephone Encounter (Signed)
i spoke w/ the pt finally, she is no longer with the clinic she sees  the md's at dialysis, they fill her meds

## 2010-09-09 NOTE — Telephone Encounter (Signed)
Rx called in 

## 2010-09-16 ENCOUNTER — Ambulatory Visit (INDEPENDENT_AMBULATORY_CARE_PROVIDER_SITE_OTHER): Payer: Self-pay | Admitting: Pharmacist

## 2010-09-16 DIAGNOSIS — Z7901 Long term (current) use of anticoagulants: Secondary | ICD-10-CM

## 2010-09-16 DIAGNOSIS — I2699 Other pulmonary embolism without acute cor pulmonale: Secondary | ICD-10-CM

## 2010-09-16 NOTE — Progress Notes (Signed)
Anti-Coagulation Progress Note  Breanna Wilkins is a 50 y.o. female who is currently on an anti-coagulation regimen.    RECENT RESULTS: Recent results are below, the most recent result is correlated with a dose of 70 mg. per week: Lab Results  Component Value Date   INR 3.6 09/16/2010   INR 1.2 08/26/2010   INR 2.6 07/29/2010    ANTI-COAG DOSE:   Latest dosing instructions   Total [name]   60 10 mg 5 mg 10 mg 10 mg 5 mg 10 mg 10 mg    (10 mg1) (10 mg0.5) (10 mg1) (10 mg1) (10 mg0.5) (10 mg1) (10 mg1)         ANTICOAG SUMMARY: Anticoagulation Episode Summary              Current INR goal 2.0-3.0 Next INR check 10/07/2010   INR from last check 3.6! (09/16/2010)     Weekly max dose (mg)  Target end date    Indications Long-term (current) use of anticoagulants, PE (pulmonary embolism)   INR check location [facility] Preferred lab    Send INR reminders to [facility]   Comments        Provider Role Specialty Phone number   Blanch Media  Internal Medicine [phone]        ANTICOAG TODAY: Anticoagulation Summary as of 09/16/2010              INR goal 2.0-3.0     Selected INR 3.6! (09/16/2010) Next INR check 10/07/2010   Weekly max dose (mg)  Target end date    Indications Long-term (current) use of anticoagulants, PE (pulmonary embolism)    Anticoagulation Episode Summary              INR check location [facility] Preferred lab    Send INR reminders to [facility]   Comments        Provider Role Specialty Phone number   Blanch Media  Internal Medicine [phone]        PATIENT INSTRUCTIONS: Patient Instructions  Patient instructed to take medications as defined in the Anti-coagulation Track section of this encounter.  Patient instructed to take today's dose.  Patient verbalized understanding of these instructions.        FOLLOW-UP Return in 3 weeks (on 10/07/2010) for Follow up INR.  Hulen Luster,  III Pharm.D., CACP

## 2010-09-16 NOTE — Patient Instructions (Signed)
Patient instructed to take medications as defined in the Anti-coagulation Track section of this encounter.  Patient instructed to take today's dose.  Patient verbalized understanding of these instructions.    

## 2010-09-18 ENCOUNTER — Telehealth (INDEPENDENT_AMBULATORY_CARE_PROVIDER_SITE_OTHER): Payer: Self-pay | Admitting: *Deleted

## 2010-09-23 ENCOUNTER — Ambulatory Visit: Payer: Self-pay | Admitting: Family Medicine

## 2010-09-24 ENCOUNTER — Encounter: Payer: Self-pay | Admitting: Internal Medicine

## 2010-09-26 NOTE — Progress Notes (Signed)
  Pt signed ROI, gave records to her while she waited. Breanna Wilkins  September 18, 2010 11:09 AM

## 2010-09-29 ENCOUNTER — Encounter: Payer: Self-pay | Admitting: Internal Medicine

## 2010-10-06 LAB — CARDIOLIPIN ANTIBODIES, IGG, IGM, IGA
Anticardiolipin IgG: 7 GPL U/mL — ABNORMAL LOW (ref <23)
Anticardiolipin IgM: 2 MPL U/mL — ABNORMAL LOW (ref <11)

## 2010-10-06 LAB — HOMOCYSTEINE: Homocysteine: 8.5 umol/L (ref 4.0–15.4)

## 2010-10-06 LAB — LUPUS ANTICOAGULANT PANEL
Lupus Anticoagulant: NOT DETECTED
PTT Lupus Anticoagulant: 38.9 secs (ref 30.0–45.6)

## 2010-10-06 LAB — FACTOR 5 LEIDEN

## 2010-10-06 LAB — TSH: TSH: 0.486 u[IU]/mL (ref 0.350–4.500)

## 2010-10-06 LAB — ANTITHROMBIN III: AntiThromb III Func: 125 % — ABNORMAL HIGH (ref 76–126)

## 2010-10-06 LAB — PROTIME-INR
INR: 0.94 (ref 0.00–1.49)
Prothrombin Time: 12.5 seconds (ref 11.6–15.2)
Prothrombin Time: 12.7 seconds (ref 11.6–15.2)

## 2010-10-06 LAB — BETA-2-GLYCOPROTEIN I ABS, IGG/M/A
Beta-2 Glyco I IgG: 2 G Units (ref <20)
Beta-2-Glycoprotein I IgA: 0 A Units (ref <20)

## 2010-10-06 LAB — PROTEIN C, TOTAL: Protein C, Total: 122 % (ref 70–140)

## 2010-10-06 LAB — CBC
HCT: 36 % (ref 36.0–46.0)
Hemoglobin: 12.3 g/dL (ref 12.0–15.0)
MCHC: 34.1 g/dL (ref 30.0–36.0)
RBC: 4.22 MIL/uL (ref 3.87–5.11)

## 2010-10-06 LAB — HEPATIC FUNCTION PANEL
Bilirubin, Direct: <0.1 mg/dL (ref 0.0–0.3)
Total Bilirubin: 0.5 mg/dL (ref 0.3–1.2)

## 2010-10-06 LAB — APTT: aPTT: 29 seconds (ref 24–37)

## 2010-10-06 LAB — PROTEIN S, TOTAL: Protein S Ag, Total: 118 % (ref 70–140)

## 2010-10-07 ENCOUNTER — Ambulatory Visit (HOSPITAL_COMMUNITY)
Admission: RE | Admit: 2010-10-07 | Discharge: 2010-10-07 | Disposition: A | Discharge status: Home / Self Care | Payer: Self-pay | Source: Ambulatory Visit | Attending: Family Medicine | Admitting: Family Medicine

## 2010-10-07 ENCOUNTER — Ambulatory Visit (INDEPENDENT_AMBULATORY_CARE_PROVIDER_SITE_OTHER): Payer: Self-pay | Admitting: Pharmacist

## 2010-10-07 DIAGNOSIS — Z7901 Long term (current) use of anticoagulants: Secondary | ICD-10-CM

## 2010-10-07 DIAGNOSIS — Z1231 Encounter for screening mammogram for malignant neoplasm of breast: Secondary | ICD-10-CM | POA: Insufficient documentation

## 2010-10-07 DIAGNOSIS — I2699 Other pulmonary embolism without acute cor pulmonale: Secondary | ICD-10-CM

## 2010-10-07 LAB — CBC
HCT: 40 % (ref 36.0–46.0)
Hemoglobin: 13 g/dL (ref 12.0–15.0)
MCV: 85.2 fL (ref 78.0–100.0)
RBC: 4.69 MIL/uL (ref 3.87–5.11)
RDW: 14.3 % (ref 11.5–15.5)
WBC: 5.6 10*3/uL (ref 4.0–10.5)

## 2010-10-07 LAB — POCT CARDIAC MARKERS
CKMB, poc: <1 ng/mL — ABNORMAL LOW (ref 1.0–8.0)
Myoglobin, poc: 29.6 ng/mL (ref 12–200)
Myoglobin, poc: 51.8 ng/mL (ref 12–200)

## 2010-10-07 LAB — BASIC METABOLIC PANEL
BUN: 14 mg/dL (ref 6–23)
Chloride: 106 mEq/L (ref 96–112)
GFR calc Af Amer: >60 mL/min (ref >60)
GFR calc non Af Amer: >60 mL/min (ref >60)
Potassium: 3.9 mEq/L (ref 3.5–5.1)
Sodium: 142 mEq/L (ref 135–145)

## 2010-10-07 LAB — DIFFERENTIAL
Basophils Absolute: 0 10*3/uL (ref 0.0–0.1)
Eosinophils Relative: 1 % (ref 0–5)
Lymphocytes Relative: 23 % (ref 12–46)
Lymphs Abs: 1.3 10*3/uL (ref 0.7–4.0)
Monocytes Absolute: 0.6 10*3/uL (ref 0.1–1.0)
Monocytes Relative: 10 % (ref 3–12)
Neutro Abs: 3.6 10*3/uL (ref 1.7–7.7)

## 2010-10-07 LAB — POCT INR: INR: 3.7

## 2010-10-07 NOTE — Progress Notes (Signed)
Anti-Coagulation Progress Note  JOURNE HALLMARK is a 50 y.o. female who is currently on an anti-coagulation regimen.    RECENT RESULTS: Recent results are below, the most recent result is correlated with a dose of 60 mg. per week: Lab Results  Component Value Date   INR 3.7 10/07/2010   INR 3.6 09/16/2010   INR 1.2 08/26/2010    ANTI-COAG DOSE:   Latest dosing instructions   Total [name]   55 10 mg 5 mg 10 mg 5 mg 10 mg 5 mg 10 mg    (10 mg1) (10 mg0.5) (10 mg1) (10 mg0.5) (10 mg1) (10 mg0.5) (10 mg1)         ANTICOAG SUMMARY: Anticoagulation Episode Summary              Current INR goal 2.0-3.0 Next INR check 10/21/2010   INR from last check 3.7! (10/07/2010)     Weekly max dose (mg)  Target end date Indefinite   Indications Long-term (current) use of anticoagulants, PE (pulmonary embolism)   INR check location [facility] Preferred lab    Send INR reminders to [facility]   Comments        Provider Role Specialty Phone number   Blanch Media  Internal Medicine [phone]        ANTICOAG TODAY: Anticoagulation Summary as of 10/07/2010              INR goal 2.0-3.0     Selected INR 3.7! (10/07/2010) Next INR check 10/21/2010   Weekly max dose (mg)  Target end date Indefinite   Indications Long-term (current) use of anticoagulants, PE (pulmonary embolism)    Anticoagulation Episode Summary              INR check location [facility] Preferred lab    Send INR reminders to [facility]   Comments        Provider Role Specialty Phone number   Blanch Media  Internal Medicine [phone]        PATIENT INSTRUCTIONS: Patient Instructions  Patient instructed to take medications as defined in the Anti-coagulation Track section of this encounter.  Patient instructed to OMIT today's dose.  Patient verbalized understanding of these instructions.        FOLLOW-UP Return in 2 weeks (on 10/21/2010) for Follow up INR.  Hulen Luster, III Pharm.D., CACP

## 2010-10-07 NOTE — Patient Instructions (Signed)
Patient instructed to take medications as defined in the Anti-coagulation Track section of this encounter.  Patient instructed to OMIT today's dose.  Patient verbalized understanding of these instructions.    

## 2010-10-14 ENCOUNTER — Ambulatory Visit (INDEPENDENT_AMBULATORY_CARE_PROVIDER_SITE_OTHER): Payer: Self-pay | Admitting: Family Medicine

## 2010-10-14 ENCOUNTER — Encounter: Payer: Self-pay | Admitting: Family Medicine

## 2010-10-14 VITALS — BP 138/80

## 2010-10-14 DIAGNOSIS — M719 Bursopathy, unspecified: Secondary | ICD-10-CM

## 2010-10-14 NOTE — Progress Notes (Signed)
  Subjective:    Patient ID: Breanna Wilkins, female    DOB: [birth year], 51 y.o.   MRN: [id number]  HPI  Continued RIGHt shoulder pain. Injection did not help. Has been thru full PT course wit h no improvement in pain or in motion. Pain worst at night. Aching 7/10 starts in shoulder and travels down into hand.  The gaba[entin did not help and she stopped it after 2 weeks because she saw blood in her urine.Has applied for disability.  Review of Systems Pertinent review of systems: negative for fever or unusual weight change.     Objective:   Physical Exam    Shoulder without obvious defect. Full strength in: all planes except supraspinatus testing some weakness associated with pain. ROM:              Internal rotation:  Full               External rotation : full              Supraspinatus testing: will only actively raise arm to 110 degrees lateral abduction and 100 degrees forward flexion. passivley she will not let me raise it much more secondary to pain Distally neurovasculalry intact Impingement signs are Positive for neer and hawkins    Assessment & Plan:  1. Shoulder pain--some RC tendinopathy (mostly supraspinatus) which is limiting her ROM. I am very concerned she is going to progress to adhesive capsulitis. She does not want to go back to PT but agrees to go for eval for TENS unit. RTC after that and we will see if the pain reeduction she gets from that wil lhelp her get more active w shoulder. She does not want to consider any more formal PT or injection therapy.

## 2010-10-17 ENCOUNTER — Emergency Department (HOSPITAL_COMMUNITY): Payer: Self-pay

## 2010-10-17 ENCOUNTER — Emergency Department (HOSPITAL_COMMUNITY)
Admission: EM | Admit: 2010-10-17 | Discharge: 2010-10-17 | Disposition: A | Discharge status: Home / Self Care | Payer: Self-pay | Attending: Emergency Medicine | Admitting: Emergency Medicine

## 2010-10-17 DIAGNOSIS — I10 Essential (primary) hypertension: Secondary | ICD-10-CM | POA: Insufficient documentation

## 2010-10-17 DIAGNOSIS — Z79899 Other long term (current) drug therapy: Secondary | ICD-10-CM | POA: Insufficient documentation

## 2010-10-17 DIAGNOSIS — Z7901 Long term (current) use of anticoagulants: Secondary | ICD-10-CM | POA: Insufficient documentation

## 2010-10-17 DIAGNOSIS — Z86718 Personal history of other venous thrombosis and embolism: Secondary | ICD-10-CM | POA: Insufficient documentation

## 2010-10-17 DIAGNOSIS — R0602 Shortness of breath: Secondary | ICD-10-CM | POA: Insufficient documentation

## 2010-10-17 DIAGNOSIS — R079 Chest pain, unspecified: Secondary | ICD-10-CM | POA: Insufficient documentation

## 2010-10-17 LAB — DIFFERENTIAL
Eosinophils Relative: 1 % (ref 0–5)
Lymphocytes Relative: 21 % (ref 12–46)
Lymphs Abs: 1.5 10*3/uL (ref 0.7–4.0)
Monocytes Relative: 9 % (ref 3–12)

## 2010-10-17 LAB — POCT CARDIAC MARKERS
Myoglobin, poc: 50.5 ng/mL (ref 12–200)
Troponin i, poc: <0.05 ng/mL (ref 0.00–0.09)

## 2010-10-17 LAB — CBC
HCT: 38 % (ref 36.0–46.0)
MCH: 26.8 pg (ref 26.0–34.0)
MCV: 83.3 fL (ref 78.0–100.0)
RBC: 4.56 MIL/uL (ref 3.87–5.11)
WBC: 7.1 10*3/uL (ref 4.0–10.5)

## 2010-10-17 LAB — BASIC METABOLIC PANEL
BUN: 16 mg/dL (ref 6–23)
Chloride: 101 mEq/L (ref 96–112)
GFR calc non Af Amer: >60 mL/min (ref >60)
Glucose, Bld: 96 mg/dL (ref 70–99)
Potassium: 3.7 mEq/L (ref 3.5–5.1)
Sodium: 139 mEq/L (ref 135–145)

## 2010-10-21 ENCOUNTER — Encounter: Payer: Self-pay | Admitting: Pharmacist

## 2010-10-21 ENCOUNTER — Ambulatory Visit (INDEPENDENT_AMBULATORY_CARE_PROVIDER_SITE_OTHER): Payer: Self-pay | Admitting: Pharmacist

## 2010-10-21 DIAGNOSIS — Z7901 Long term (current) use of anticoagulants: Secondary | ICD-10-CM

## 2010-10-21 DIAGNOSIS — I2699 Other pulmonary embolism without acute cor pulmonale: Secondary | ICD-10-CM

## 2010-10-21 LAB — POCT INR: INR: 3.7

## 2010-10-21 NOTE — Patient Instructions (Addendum)
Patient instructed to take medications as defined in the Anti-coagulation Track section of this encounter.  Patient instructed to OMIT today's dose.  Patient verbalized understanding of these instructions.  Patient had been on 55mg /wk warfarin. Patient has been instructed to OMIT today's dose and decrease weekly dose to 50mg /wk warfarin. She has been provided instructions from [employer].

## 2010-10-21 NOTE — Progress Notes (Signed)
Anti-Coagulation Progress Note  Breanna Wilkins is a 50 y.o. female who is currently on an anti-coagulation regimen.    RECENT RESULTS: Recent results are below, the most recent result is correlated with a dose of 55 mg. per week: Lab Results  Component Value Date   INR 3.7 10/21/2010   INR 2.12* 10/17/2010   INR 3.7 10/07/2010    ANTI-COAG DOSE:   Latest dosing instructions   Total [name]   55 10 mg 5 mg 10 mg 5 mg 10 mg 5 mg 10 mg    (10 mg1) (10 mg0.5) (10 mg1) (10 mg0.5) (10 mg1) (10 mg0.5) (10 mg1)         ANTICOAG SUMMARY:   ANTICOAG TODAY:   PATIENT INSTRUCTIONS: There are no Patient Instructions on file for this visit.   FOLLOW-UP Return in 2 weeks (on 11/04/2010) for Follow up INR.  Hulen Luster, III Pharm.D., CACP

## 2010-10-21 NOTE — Progress Notes (Deleted)
Patient instructed to take medications as defined in the Anti-coagulation Track section of this encounter.  Patient instructed to take today's dose.  Patient verbalized understanding of these instructions.    

## 2010-10-29 ENCOUNTER — Ambulatory Visit: Payer: Self-pay | Attending: Family Medicine | Admitting: Physical Therapy

## 2010-10-29 DIAGNOSIS — M25519 Pain in unspecified shoulder: Secondary | ICD-10-CM | POA: Insufficient documentation

## 2010-10-29 DIAGNOSIS — IMO0001 Reserved for inherently not codable concepts without codable children: Secondary | ICD-10-CM | POA: Insufficient documentation

## 2010-11-04 ENCOUNTER — Ambulatory Visit (INDEPENDENT_AMBULATORY_CARE_PROVIDER_SITE_OTHER): Payer: Self-pay | Admitting: Pharmacist

## 2010-11-04 ENCOUNTER — Ambulatory Visit: Payer: Self-pay | Admitting: Family Medicine

## 2010-11-04 DIAGNOSIS — Z7901 Long term (current) use of anticoagulants: Secondary | ICD-10-CM

## 2010-11-04 DIAGNOSIS — I2699 Other pulmonary embolism without acute cor pulmonale: Secondary | ICD-10-CM

## 2010-11-04 MED ORDER — WARFARIN SODIUM 10 MG PO TABS: ORAL_TABLET | ORAL | Status: DC

## 2010-11-04 NOTE — Patient Instructions (Signed)
Patient instructed to take medications as defined in the Anti-coagulation Track section of this encounter.  Patient instructed to take today's dose.  Patient verbalized understanding of these instructions.    

## 2010-11-04 NOTE — Progress Notes (Signed)
Anti-Coagulation Progress Note  Breanna Wilkins is a 50 y.o. female who is currently on an anti-coagulation regimen.    RECENT RESULTS: Recent results are below, the most recent result is correlated with a dose of 55 mg. per week: Lab Results  Component Value Date   INR 3.1 11/04/2010   INR 3.7 10/21/2010   INR 2.12* 10/17/2010    ANTI-COAG DOSE:   Latest dosing instructions   Total [name]   45 5 mg 10 mg 5 mg 5 mg 10 mg 5 mg 5 mg    (10 mg0.5) (10 mg1) (10 mg0.5) (10 mg0.5) (10 mg1) (10 mg0.5) (10 mg0.5)         ANTICOAG SUMMARY: Anticoagulation Episode Summary              Current INR goal 2.0-3.0 Next INR check 12/02/2010   INR from last check 3.1! (11/04/2010)     Weekly max dose (mg)  Target end date Indefinite   Indications Long-term (current) use of anticoagulants, PE (pulmonary embolism)   INR check location [facility] Preferred lab    Send INR reminders to [name]   Comments        Provider Role Specialty Phone number   Blanch Media  Internal Medicine [phone]        ANTICOAG TODAY: Anticoagulation Summary as of 11/04/2010              INR goal 2.0-3.0     Selected INR 3.1! (11/04/2010) Next INR check 12/02/2010   Weekly max dose (mg)  Target end date Indefinite   Indications Long-term (current) use of anticoagulants, PE (pulmonary embolism)    Anticoagulation Episode Summary              INR check location [facility] Preferred lab    Send INR reminders to [facility]   Comments        Provider Role Specialty Phone number   Blanch Media  Internal Medicine [phone]        PATIENT INSTRUCTIONS: Patient Instructions  Patient instructed to take medications as defined in the Anti-coagulation Track section of this encounter.  Patient instructed to take today's dose.  Patient verbalized understanding of these instructions.        FOLLOW-UP Return in 4 weeks (on 12/02/2010) for Follow up  INR.  Hulen Luster, III Pharm.D., CACP

## 2010-11-05 ENCOUNTER — Ambulatory Visit: Payer: Self-pay | Admitting: Cardiology

## 2010-11-07 ENCOUNTER — Other Ambulatory Visit (HOSPITAL_COMMUNITY): Payer: Self-pay | Admitting: Chiropractic Medicine

## 2010-11-07 ENCOUNTER — Other Ambulatory Visit: Payer: Self-pay | Admitting: *Deleted

## 2010-11-07 DIAGNOSIS — M75101 Unspecified rotator cuff tear or rupture of right shoulder, not specified as traumatic: Secondary | ICD-10-CM

## 2010-11-07 DIAGNOSIS — M25511 Pain in right shoulder: Secondary | ICD-10-CM

## 2010-11-07 DIAGNOSIS — R52 Pain, unspecified: Secondary | ICD-10-CM

## 2010-11-08 ENCOUNTER — Encounter: Payer: Self-pay | Admitting: Cardiology

## 2010-11-09 ENCOUNTER — Ambulatory Visit (HOSPITAL_COMMUNITY)
Admission: RE | Admit: 2010-11-09 | Discharge: 2010-11-09 | Disposition: A | Discharge status: Home / Self Care | Payer: Self-pay | Source: Ambulatory Visit | Attending: Chiropractic Medicine | Admitting: Chiropractic Medicine

## 2010-11-09 DIAGNOSIS — M25569 Pain in unspecified knee: Secondary | ICD-10-CM | POA: Insufficient documentation

## 2010-11-09 DIAGNOSIS — M23359 Other meniscus derangements, posterior horn of lateral meniscus, unspecified knee: Secondary | ICD-10-CM | POA: Insufficient documentation

## 2010-11-09 DIAGNOSIS — R52 Pain, unspecified: Secondary | ICD-10-CM

## 2010-11-11 ENCOUNTER — Encounter: Payer: Self-pay | Admitting: Cardiology

## 2010-11-11 ENCOUNTER — Ambulatory Visit (INDEPENDENT_AMBULATORY_CARE_PROVIDER_SITE_OTHER): Payer: Self-pay | Admitting: Cardiology

## 2010-11-11 DIAGNOSIS — R0989 Other specified symptoms and signs involving the circulatory and respiratory systems: Secondary | ICD-10-CM

## 2010-11-11 DIAGNOSIS — R911 Solitary pulmonary nodule: Secondary | ICD-10-CM

## 2010-11-11 DIAGNOSIS — J984 Other disorders of lung: Secondary | ICD-10-CM

## 2010-11-11 DIAGNOSIS — R0602 Shortness of breath: Secondary | ICD-10-CM

## 2010-11-11 DIAGNOSIS — R9430 Abnormal result of cardiovascular function study, unspecified: Secondary | ICD-10-CM

## 2010-11-11 DIAGNOSIS — I2699 Other pulmonary embolism without acute cor pulmonale: Secondary | ICD-10-CM

## 2010-11-11 NOTE — Assessment & Plan Note (Signed)
The patient is on Coumadin.  No change in therapy.

## 2010-11-11 NOTE — Assessment & Plan Note (Signed)
There is history of a small lung nodule seen by chest CT in June, 2011.  Recommendation was made for followup in one year.  I will leave this to her primary physician.

## 2010-11-11 NOTE — Assessment & Plan Note (Signed)
Historically her ejection fraction is normal.  No further workup.  The patient will see her primary care physician in the internal medicine clinic.  This is scheduled soon.

## 2010-11-11 NOTE — Patient Instructions (Signed)
Your physician recommends that you schedule a follow-up appointment in:12 months with Dr. Myrtis Ser. Continue seeing primary care doctor as scheduled.

## 2010-11-11 NOTE — Progress Notes (Signed)
HPI The patient is here for cardiology followup.  I saw her last September, 2011.  She has history of pulmonary embolus and she has been on Coumadin.  She was seen in the emergency room on October 22, 2010. She had some shortness of breath that day.  Her chest x-ray did not show any acute infiltrate.  She appeared to be stable in the emergency room and no further workup was recommended.  Her chest x-ray was read as showing some cardiomegaly.  There was no CHF. Allergies  Allergen Reactions  . Aspirin     REACTION: Nausea (325mg )    Current Outpatient Prescriptions  Medication Sig Dispense Refill  . amLODipine (NORVASC) 5 MG tablet Take 5 mg by mouth daily.        Marland Kitchen lisinopril-hydrochlorothiazide (PRINZIDE,ZESTORETIC) 20-12.5 MG per tablet Take 1 tablet by mouth every morning.        . warfarin (COUMADIN) 10 MG tablet Take as directed by anticoagulation clinic provider. Dose strength is 10 (TEN) milligrams. Take as directed.  30 tablet  1  . zolpidem (AMBIEN) 10 MG tablet TAKE ONE TABLET BY MOUTH AT BEDTIME AS NEEDED FOR SLEEP  30 tablet  0  . DISCONTD: gabapentin (NEURONTIN) 300 MG capsule Take 300 mg by mouth 3 (three) times daily.          History   Social History  . Marital Status: Single    Spouse Name: N/A    Number of Children: N/A  . Years of Education: N/A   Occupational History  . Not on file.   Social History Main Topics  . Smoking status: Former Games developer  . Smokeless tobacco: Not on file  . Alcohol Use: No  . Drug Use: No  . Sexually Active: Not on file   Other Topics Concern  . Not on file   Social History Narrative   Financial assistance approved for 100% discount at [facility] and has [facility] card per [place]6/01/2010    Family History  Problem Relation Age of Onset  . Diabetes Father   . Diabetes Sister   . Diabetes Mother     Past Medical History  Diagnosis Date  . Shoulder pain   . Pulmonary embolus 01/16/10    on Coumadin  . OSA (obstructive sleep apnea)     . Abdominal hernia   . Hypertension   . Overweight   . Lung nodule 12/2009    Very small, left upper lobe by CT June, 2011, needs one-year followup  . Shortness of breath   . Warfarin anticoagulation   . Ejection fraction     65-70%, vigorous function, echo, March, 2011    Past Surgical History  Procedure Date  . Abdominal hysterectomy 07/2002    laparoscopic assisted vaginal hysterectomy for menorrhagia, dysmenorrhea, anemia, fibroids    ROS  Patient denies fever, chills, headache, sweats, rash, change in vision, change in hearing, chest pain, cough, nausea vomiting, urinary symptoms.  All other systems are reviewed and are negative.  PHYSICAL EXAM Patient is stable today.  She is overweight.  Head is atraumatic.  There is no xanthelasma.  There is no jugular venous distention.  Lungs are clear.  Respiratory effort is unlabored.  Cardiac exam reveals S1-S2.  No clicks or significant murmurs.  The abdomen is soft.  There is no peripheral edema. Filed Vitals:   11/11/10 1126  BP: 138/94  Pulse: 70  Resp: 14  Height: 5\' 7"  (1.702 m)  Weight: 277 lb (125.646 kg)  EKG No EKG is done today.  ASSESSMENT & PLAN

## 2010-11-11 NOTE — Assessment & Plan Note (Signed)
The patient is stable at this time.  She was seen in emergency room on October 22, 2010.  Chest x-ray did not show CHF.  There was question of some increase in heart size.  This is probably due to her left ventricular hypertrophy.  We know she has vigorous LV function from a recent echo.  Her INR was 2.12 and the therapeutic range.  There was no followup chest CT done at that time.  She is stable at this point.  She does not need any further workup for the question of "cardiac enlargement" on her chest x-ray.

## 2010-11-18 ENCOUNTER — Ambulatory Visit: Payer: Self-pay | Admitting: Physical Therapy

## 2010-11-26 ENCOUNTER — Ambulatory Visit (INDEPENDENT_AMBULATORY_CARE_PROVIDER_SITE_OTHER): Payer: Self-pay | Admitting: Internal Medicine

## 2010-11-26 VITALS — BP 134/81 | HR 70 | Temp 97.4°F | Ht 67.0 in | Wt 276.3 lb

## 2010-11-26 DIAGNOSIS — R911 Solitary pulmonary nodule: Secondary | ICD-10-CM

## 2010-11-26 DIAGNOSIS — M25511 Pain in right shoulder: Secondary | ICD-10-CM

## 2010-11-26 DIAGNOSIS — M25519 Pain in unspecified shoulder: Secondary | ICD-10-CM

## 2010-11-26 DIAGNOSIS — M719 Bursopathy, unspecified: Secondary | ICD-10-CM

## 2010-11-26 DIAGNOSIS — M67919 Unspecified disorder of synovium and tendon, unspecified shoulder: Secondary | ICD-10-CM

## 2010-11-26 DIAGNOSIS — I10 Essential (primary) hypertension: Secondary | ICD-10-CM

## 2010-11-26 DIAGNOSIS — J984 Other disorders of lung: Secondary | ICD-10-CM

## 2010-11-26 DIAGNOSIS — M2240 Chondromalacia patellae, unspecified knee: Secondary | ICD-10-CM

## 2010-11-26 MED ORDER — AMLODIPINE BESYLATE 5 MG PO TABS: 5.0000 mg | ORAL_TABLET | Freq: Every day | ORAL | Status: DC

## 2010-11-26 NOTE — Patient Instructions (Signed)
We will call you for MRI appointment, in the mean time, continue physical therapy and follow up with Dr. Jennette Kettle Continue blood pressure medications and low salt diet & exercise Follow up with Dr. Anselm Jungling in 2 months

## 2010-11-27 NOTE — Assessment & Plan Note (Signed)
Stable, managed by Dr. Jennette Kettle.  I also continue to encourage patient to loose weight which would alleviate her knee pain as well.  Patient states that she does not want to try a lot of different medication because of fear of medication interaction with her Warfarin.

## 2010-11-27 NOTE — Assessment & Plan Note (Signed)
Well controlled.  Will continue current medication regimen.  Refill Norvasc today.
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
## 2010-11-27 NOTE — Assessment & Plan Note (Signed)
Will schedule follow up CT scan at next OV in July.

## 2010-11-27 NOTE — Assessment & Plan Note (Signed)
Patient failed conservative management.  I discussed the importance of continue PT and that it takes time to see improvement.  Will send patient for MRI of right shoulder to determine the severity of her rotator cuff damage and will discuss other treatment options.

## 2010-12-02 ENCOUNTER — Other Ambulatory Visit: Payer: Self-pay

## 2010-12-02 ENCOUNTER — Ambulatory Visit (INDEPENDENT_AMBULATORY_CARE_PROVIDER_SITE_OTHER): Payer: Self-pay | Admitting: Pharmacist

## 2010-12-02 DIAGNOSIS — Z7901 Long term (current) use of anticoagulants: Secondary | ICD-10-CM

## 2010-12-02 DIAGNOSIS — I2699 Other pulmonary embolism without acute cor pulmonale: Secondary | ICD-10-CM

## 2010-12-02 DIAGNOSIS — M67919 Unspecified disorder of synovium and tendon, unspecified shoulder: Secondary | ICD-10-CM

## 2010-12-02 LAB — POCT INR: INR: 2

## 2010-12-02 NOTE — Progress Notes (Signed)
Received call from radiology, pt needs to have a more recent  bmet drawn prior to mri.  Order was placed and lab was obtained.  Will forward order to md for review.

## 2010-12-02 NOTE — Patient Instructions (Signed)
Patient instructed to take medications as defined in the Anti-coagulation Track section of this encounter.  Patient instructed to take today's dose.  Patient verbalized understanding of these instructions.    

## 2010-12-02 NOTE — Progress Notes (Signed)
Anti-Coagulation Progress Note  TURNER KUNZMAN is a 50 y.o. female who is currently on an anti-coagulation regimen.    RECENT RESULTS: Recent results are below, the most recent result is correlated with a dose of 45 mg. per week: Lab Results  Component Value Date   INR 2.00 12/02/2010   INR 3.1 11/04/2010   INR 3.7 10/21/2010    ANTI-COAG DOSE:   Latest dosing instructions   Total [name] Tue Wed Thu Fri Sat   50 5 mg 10 mg 5 mg 10 mg 5 mg 10 mg 5 mg    (10 mg0.5) (10 mg1) (10 mg0.5) (10 mg1) (10 mg0.5) (10 mg1) (10 mg0.5)         ANTICOAG SUMMARY: Anticoagulation Episode Summary              Current INR goal 2.0-3.0 Next INR check 12/30/2010   INR from last check 2.00 (12/02/2010)     Weekly max dose (mg)  Target end date Indefinite   Indications Long-term (current) use of anticoagulants, PE (pulmonary embolism)   INR check location [facility] Preferred lab    Send INR reminders to [facility]   Comments        Provider Role Specialty Phone number   Blanch Media  Internal Medicine [phone]        ANTICOAG TODAY: Anticoagulation Summary as of 12/02/2010              INR goal 2.0-3.0     Selected INR 2.00 (12/02/2010) Next INR check 12/30/2010   Weekly max dose (mg)  Target end date Indefinite   Indications Long-term (current) use of anticoagulants, PE (pulmonary embolism)    Anticoagulation Episode Summary              INR check location [facility] Preferred lab    Send INR reminders to [facility]   Comments        Provider Role Specialty Phone number   Blanch Media  Internal Medicine [phone]        PATIENT INSTRUCTIONS: Patient Instructions  Patient instructed to take medications as defined in the Anti-coagulation Track section of this encounter.  Patient instructed to take today's dose.  Patient verbalized understanding of these instructions.        FOLLOW-UP Return in 4 weeks (on 12/30/2010) for Follow up  INR.  Hulen Luster, III Pharm.D., CACP

## 2010-12-03 ENCOUNTER — Ambulatory Visit (HOSPITAL_COMMUNITY)
Admission: RE | Admit: 2010-12-03 | Discharge: 2010-12-03 | Disposition: A | Discharge status: Home / Self Care | Payer: Self-pay | Source: Ambulatory Visit | Attending: Internal Medicine | Admitting: Internal Medicine

## 2010-12-03 DIAGNOSIS — M19019 Primary osteoarthritis, unspecified shoulder: Secondary | ICD-10-CM | POA: Insufficient documentation

## 2010-12-03 DIAGNOSIS — M25519 Pain in unspecified shoulder: Secondary | ICD-10-CM | POA: Insufficient documentation

## 2010-12-03 DIAGNOSIS — M25511 Pain in right shoulder: Secondary | ICD-10-CM

## 2010-12-03 DIAGNOSIS — M719 Bursopathy, unspecified: Secondary | ICD-10-CM

## 2010-12-03 LAB — BASIC METABOLIC PANEL
Calcium: 9.6 mg/dL (ref 8.4–10.5)
Glucose, Bld: 87 mg/dL (ref 70–99)
Potassium: 4 mEq/L (ref 3.5–5.3)
Sodium: 142 mEq/L (ref 135–145)

## 2010-12-03 NOTE — Progress Notes (Signed)
Addended by: Kingsley Spittle on: 12/03/2010 10:25 AM   Modules accepted: Orders

## 2010-12-06 NOTE — Discharge Summary (Signed)
   [name], Breanna Wilkins                        ACCOUNT NO.:  [id number]   MEDICAL RECORD NO.:  [id number]                   PATIENT TYPE:  INP   LOCATION:  9312                                 FACILITY:  [facility]   PHYSICIAN:  Katy Fitch, M.D.               DATE OF BIRTH:  08/25/1960   DATE OF ADMISSION:  08/09/2002  DATE OF DISCHARGE:  08/11/2002                                 DISCHARGE SUMMARY   DISCHARGE DIAGNOSES:  1. Menorrhagia.  2. Dysmenorrhea.  3. Anemia.  4. Fibroid uterus.  5. Status post laparoscopy-assisted vaginal hysterectomy by Dr. Katy Fitch on August 09, 2002.   HISTORY:  A 41-years-of-age female with a long-time history of menorrhagia  and dysmenorrhea status post Lupron for fibroid uterus with a history of  anemia who was admitted for definitive surgery.   HOSPITAL COURSE:  On August 09, 2002 the patient underwent a laparoscopy-  assisted vaginal hysterectomy by Dr. Katy Fitch without complications.  The patient restarted her hydrochlorothiazide on August 09, 2002 for  hypertension and her blood pressure on August 10, 2002 was 134/80.  She  remained afebrile, voiding, in stable condition, but did stay the night of  August 10, 2002 because she was feeling weak and not ready for discharge,  but on August 11, 2002 was in satisfactory condition, afebrile, voiding,  and was discharged to home.   ACCESSORY CLINICAL FINDINGS/LABORATORY DATA:  On August 10, 2002 hemoglobin  was 10.3.   DISPOSITION:  1. The patient was discharged to home.  2. Informed to return to the office in three weeks.  3. Given a prescription for Tylox p.r.n. pain.     Susa Loffler, P.A.                    Katy Fitch, M.D.    [name]  D:  09/09/2002  T:  09/09/2002  Job:  [id number]

## 2010-12-06 NOTE — Op Note (Signed)
NAME:  Breanna Wilkins, Breanna Wilkins                        ACCOUNT NO.:  [id number]   MEDICAL RECORD NO.:  [id number]                   PATIENT TYPE:  OBV   LOCATION:  9312                                 FACILITY:  [facility]   PHYSICIAN:  Katy Fitch, M.D.               DATE OF BIRTH:  Jun 12, 1961   DATE OF PROCEDURE:  08/09/2002  DATE OF DISCHARGE:                                 OPERATIVE REPORT   PREOPERATIVE DIAGNOSES:  1. Menorrhagia.  2. Dysmenorrhea.  3. Anemia.  4. Fibroid uterus.   POSTOPERATIVE DIAGNOSES:  1. Menorrhagia.  2. Dysmenorrhea.  3. Anemia.  4. Fibroid uterus.   PROCEDURE:  Laparoscopically-assisted vaginal hysterectomy.   SURGEON:  Katy Fitch, M.D.   ASSISTANTMarcial Pacas P. Fontaine, M.D.   ANESTHESIA:  General.   ESTIMATED BLOOD LOSS:  250 cubic centimeters.   URINE OUTPUT:  125 cubic centimeters.   FLUIDS:  2 liters of crystalloid.   COMPLICATIONS:  None.   SPECIMENS:  Uterus.   FINDINGS:  Eight week size uterus with normal-appearing tubes, ovaries,  bowel, liver and gallbladder, appendix.   DESCRIPTION OF PROCEDURE:  The patient was taken to the operating room where  general anesthesia was administered.  The patient was placed in [place]  stirrups and prepped and draped in the normal sterile fashion.  A Foley  catheter was inserted into the bladder with clear urine returned.  A Hulka  uterine manipulator was then placed into the cervix for uterine traction.  A  1 cm infraumbilical incision was made vertically with a A#15 blade.  The  abdominal wall was tented up manually, and the Veress needle was then  inserted into the abdominal cavity.  Saline was then placed down in through  the tube and noted to disappear inside the Veress needle.  CO2 gas was then  turned on, and instilled through the Veress needle.  Pressure was obtained  to approximately 14 mmHg at which point the Veress needle was removed.  Abdominal wall was tented up with a 10 mm trocar  was inserted into the  abdominopelvic cavity.  The camera device was assembled and upon entrance  into the abdominopelvic cavity was noted to be.  The uterus was then  manipulated, and the bowel was retracted, and the above findings were noted  with having a fibroid uterus with normal appearing tubes, ovaries, bowel,  gallbladder, liver edge and appendix.  At this point, two 5 mm ports were  placed under direct visualization in the right and left lower quadrants.  The right side was then initially dressed with the uterine ovarian pedicle  bipolar cautery down to the round ligament in which the round ligament was  also cauterized with the bipolar.  Sharp scissors were then used to transect  these structures at which point past the round ligament the bladder flap was  then opened using slip scissors and carried across to the left  hand side.  The left utero-ovarian pedicle was then isolated and bipolar with cautery  with cautery in a similar fashion to the right all the way down to the right  ligament.  These were also transected using hook scissors and at this point  the bladder flap was then connected with the hook scissors and noted to be  free.  Hemostasis was noted at this point and the carbon dioxide gas and  turned off and the instruments were removed from the pelvis.  The vaginal  portion of the case was then commenced.  A weighted speculum was then placed  into the vagina.  A Jacob's tenaculum was placed on the anterior lip of the  cervix.  Vasopressin solution 10 units in 50 cubic centimeters was then  injected circumferentially around the cervicovaginal mucosa.  Scalpel was  used to incise this junction down to the underlying pubovesical cervical  fascia.  The posterior peritoneum was identified and entered sharply with  Mayo scissors.  A long __________ speculum was then placed into the  posterior cul-de-sac. A finger on a Raytex was then used into the anterior  portion of the  cul-de-sac without difficulty.  The uterosacral ligament on  the left was then grasped with a Haney, suture ligated with 0 Vicryl suture  and held with a hemostat.  This was done similarly on the right.  The  cardinal ligament was then grasped on the left and ligated with 0 Vicryl  suture and this was also done similarly on the right.  One more bit on the  right and left side was then performed to get to the uterine vessels, which  were ligated with 0 Vicryl suture.  At this point, the uterus then cored and  once the coring occurred the uterus collapsed and was able to be lifted into  the vagina.  At this point, it was noted that there were only two fibrous  attachments on either side which were cross-clamped and the uterine specimen  was excised, and these pedicles were free tied with 0 Vicryl suture.  The  posterior cuff was then suture ligated in a running interlocking stitch with  0 Vicryl suture.  Hemostasis was noted.  The pelvis was irrigated.  The left  angle was then closed using a figure-of-eight with 0 Vicryl suture.  Vertical mattress sutures were then placed into the rest of the vaginal cuff  for closure and then the right angle was closed using a figure-of-eight of 0  Vicryl suture.  Marcaine solution was then injected into the vaginal cuff  and uterosacral ligaments.  Attention was then turned towards the abdominal  cavity.  Once again, the camera device was placed back into a 10 mm trocar.  Carbon dioxide gas was filled.  Sponge stick was placed into the vaginal  area.  Suction irrigator was used to go from the left utero-ovarian ligament  to the right and each place noted to be hemostatic.  At this point, the two  5 mm ports were then removed under direct visualization without any  bleeding.  The carbon dioxide gas was removed from the abdomen and deflated. The 10 mm port was then removed. The fascia was then closed using a GI  needle of 0 Vicryl in a figure-of-eight  fashion.  The two lower incisions, 5  mm, were closed with an interrupted of 3-0 Vicryl.  The skin at the  infraumbilical incision was closed using a 3-0 Vicryl in a subcuticular  fashion.  Each incision site was injected with Marcaine.  The patient was  taken to the recovery room in stable condition.                                               Katy Fitch, M.D.    DC/MEDQ  D:  08/09/2002  T:  08/09/2002  Job:  [id number]

## 2010-12-13 ENCOUNTER — Ambulatory Visit: Payer: Self-pay | Admitting: Family Medicine

## 2010-12-30 ENCOUNTER — Ambulatory Visit (INDEPENDENT_AMBULATORY_CARE_PROVIDER_SITE_OTHER): Payer: Self-pay | Admitting: Pharmacist

## 2010-12-30 DIAGNOSIS — Z7901 Long term (current) use of anticoagulants: Secondary | ICD-10-CM

## 2010-12-30 DIAGNOSIS — I2699 Other pulmonary embolism without acute cor pulmonale: Secondary | ICD-10-CM

## 2010-12-30 LAB — POCT INR: INR: 2.2

## 2010-12-30 NOTE — Patient Instructions (Signed)
Patient instructed to take medications as defined in the Anti-coagulation Track section of this encounter.  Patient instructed to take today's dose.  Patient verbalized understanding of these instructions.    

## 2010-12-30 NOTE — Progress Notes (Signed)
Anti-Coagulation Progress Note  ALEATHIA PURDY is a 50 y.o. female who is currently on an anti-coagulation regimen.    RECENT RESULTS: Recent results are below, the most recent result is correlated with a dose of 50 mg. per week: Lab Results  Component Value Date   INR 2.2 12/30/2010   INR 2.00 12/02/2010   INR 3.1 11/04/2010    ANTI-COAG DOSE:   Latest dosing instructions   Total [name] Tue Wed Thu Fri Sat   50 5 mg 10 mg 5 mg 10 mg 5 mg 10 mg 5 mg    (10 mg0.5) (10 mg1) (10 mg0.5) (10 mg1) (10 mg0.5) (10 mg1) (10 mg0.5)         ANTICOAG SUMMARY: Anticoagulation Episode Summary              Current INR goal 2.0-3.0 Next INR check 01/27/2011   INR from last check 2.2 (12/30/2010)     Weekly max dose (mg)  Target end date Indefinite   Indications Long-term (current) use of anticoagulants, PE (pulmonary embolism)   [facility] check location [facility]    Send INR reminders to [facility]   Comments        Provider Role Specialty Phone number   Blanch Media  Internal Medicine [phone]        ANTICOAG TODAY: Anticoagulation Summary as of 12/30/2010              INR goal 2.0-3.0     Selected INR 2.2 (12/30/2010) Next INR check 01/27/2011   Weekly max dose (mg)  Target end date Indefinite   Indications Long-term (current) use of anticoagulants, PE (pulmonary embolism)    Anticoagulation Episode Summary              INR check location [facility] Preferred lab    Send INR reminders to [facility]   Comments        Provider Role Specialty Phone number   Blanch Media  Internal Medicine [phone]        PATIENT INSTRUCTIONS: Patient Instructions  Patient instructed to take medications as defined in the Anti-coagulation Track section of this encounter.  Patient instructed to take today's dose.  Patient verbalized understanding of these instructions.        FOLLOW-UP Return in 4 weeks (on 01/27/2011) for Follow up INR.  Hulen Luster, III Pharm.D., CACP

## 2010-12-31 ENCOUNTER — Encounter: Payer: Self-pay | Admitting: Internal Medicine

## 2011-01-14 ENCOUNTER — Ambulatory Visit (INDEPENDENT_AMBULATORY_CARE_PROVIDER_SITE_OTHER): Payer: Self-pay | Admitting: Internal Medicine

## 2011-01-14 ENCOUNTER — Encounter: Payer: Self-pay | Admitting: Internal Medicine

## 2011-01-14 DIAGNOSIS — R911 Solitary pulmonary nodule: Secondary | ICD-10-CM

## 2011-01-14 DIAGNOSIS — G4733 Obstructive sleep apnea (adult) (pediatric): Secondary | ICD-10-CM

## 2011-01-14 DIAGNOSIS — J984 Other disorders of lung: Secondary | ICD-10-CM

## 2011-01-14 DIAGNOSIS — I10 Essential (primary) hypertension: Secondary | ICD-10-CM

## 2011-01-14 DIAGNOSIS — M25519 Pain in unspecified shoulder: Secondary | ICD-10-CM

## 2011-01-14 DIAGNOSIS — M719 Bursopathy, unspecified: Secondary | ICD-10-CM

## 2011-01-14 DIAGNOSIS — R0602 Shortness of breath: Secondary | ICD-10-CM

## 2011-01-14 MED ORDER — LISINOPRIL-HYDROCHLOROTHIAZIDE 20-12.5 MG PO TABS: 1.0000 | ORAL_TABLET | ORAL | Status: DC

## 2011-01-14 NOTE — Progress Notes (Signed)
Breanna Wilkins is a 49 yo woman who is well-known to me presents for routine follow up.  She is complaining of bilaterally leg edema that has been there for several weeks.  She states that she has not been taking her blood pressure in the past few days [place]  Patient has DOE especially when she mows her lawn.  She does have a history of a provoked PE in June 2011 and has been compliant with her Coumadin in the past year followed by Dr. Alexandria Lodge.  [name] continues to have left knee and right shoulder pain.  She states that she can barely move her right shoulder in the past several months.  She also states that she gets apneic at night with the CPAP machine and thinks that it needs to be re-adjusted. Denies any fever, chestpain, or any other systemic symptoms.  ROS: as per HPI  PE: General: alert, well-developed, and cooperative to examination.  Lungs: normal respiratory effort, no accessory muscle use, normal breath sounds, no crackles, and no wheezes. Heart: normal rate, regular rhythm, no murmur, no gallop, and no rub.  Abdomen: soft, non-tender, normal bowel sounds, no distention, no guarding, no rebound tenderness, no hepatomegaly, and no splenomegaly.  Msk: mild effusion on left knee at patella, tenderness but no erythema or discharge or warm to touch, +click, limited ROM. Right shoulder: very limited ROM, patient cannot perform empty-can test, cannot adduct right shoulder above 30 degrees, +Tenderness to palpation, no erythema Pulses: 2+ DP/PT pulses bilaterally Extremities: No cyanosis, clubbing.  +1-2 pitting edema Neurologic: alert & oriented X3, cranial nerves II-XII intact, strength normal in all extremities, sensation intact to light touch, and gait normal.  Skin: turgor normal and no rashes.  Psych: Oriented X3, memory intact for recent and remote, normally interactive, good eye contact, not anxious appearing, and not depressed appearing.

## 2011-01-14 NOTE — Assessment & Plan Note (Signed)
Has trouble sleeping at night with episodic apnea. -Will have [facility] re-evaluate machine  -may need another sleep study for titration.

## 2011-01-14 NOTE — Assessment & Plan Note (Signed)
Patient has a history of SOB and did have a PE in 12/2009 and has been on coumadin in the past year.  She was also recently seen by Dr. Katz-cardiologist for workup of SOB of cardiac in origin.  2-D echo showed EF 70% and that Dr. Myrtis Ser did not think she need further work-up from cardiology's stand point.  She did have some cardiomegaly on chest xray but no CHF.  Her O2 sat on RA was 98%. -Will schedule patient for repeat/follow up CT chest to evaluate small lung nodule noted from last year and to make sure that it has not progressed. -If continues to have SOB, I will check pro-BNP at next office visit  And repeat EKG

## 2011-01-14 NOTE — Patient Instructions (Addendum)
Please stop taking Norvasc We will schedule orthopedics appointment for your shoulder evaluation Will schedule CT chest for follow up on lung nodule Follow up with Dr. Anselm Jungling in 2 weeks

## 2011-01-14 NOTE — Assessment & Plan Note (Signed)
MRI on 12/03/10 showed "full-thickness retracted supraspinatus tendon tear and significant surrounding rotator cuff tendinopathy, AC joint degenerative changes, bony impingement, significant long head biceps tendinopathy at risk for tearing."   -Physical exam: limited ROM of right shoulder 2/2 severe pain. -I discussed results with patient and she agrees to be evaluated by orthopedics for possible surgical intervention.  -Will refer to orthopedics -Will continue symptom management at this time, patient is also being followed by Dr. Jennette Kettle with sport medicine.

## 2011-01-14 NOTE — Assessment & Plan Note (Signed)
BP 130's/80's.  Adequately controlled even though she states that she has not been compliant with her meds in the past few days.  In addition, she has noticed some bilateral leg edema.  The edema could be 2/2 one of Norvasc's side effects.   -Will stop norvasc today  -Re-evaluate her BP and edema in 2 weeks   -Continue Lisinopril/HCTZ

## 2011-01-14 NOTE — Assessment & Plan Note (Signed)
-  Will schedule for repeat CT of chest

## 2011-01-15 LAB — BASIC METABOLIC PANEL WITH GFR
BUN: 17 mg/dL (ref 6–23)
Chloride: 105 mEq/L (ref 96–112)
GFR, Est African American: >60 mL/min (ref >60)
GFR, Est Non African American: >60 mL/min (ref >60)
Potassium: 4.2 mEq/L (ref 3.5–5.3)
Sodium: 143 mEq/L (ref 135–145)

## 2011-01-17 ENCOUNTER — Ambulatory Visit (HOSPITAL_COMMUNITY)
Admission: RE | Admit: 2011-01-17 | Discharge: 2011-01-17 | Disposition: A | Discharge status: Home / Self Care | Payer: Self-pay | Source: Ambulatory Visit | Attending: Internal Medicine | Admitting: Internal Medicine

## 2011-01-17 DIAGNOSIS — Z09 Encounter for follow-up examination after completed treatment for conditions other than malignant neoplasm: Secondary | ICD-10-CM | POA: Insufficient documentation

## 2011-01-17 DIAGNOSIS — J984 Other disorders of lung: Secondary | ICD-10-CM | POA: Insufficient documentation

## 2011-01-17 DIAGNOSIS — F17200 Nicotine dependence, unspecified, uncomplicated: Secondary | ICD-10-CM | POA: Insufficient documentation

## 2011-01-17 DIAGNOSIS — R911 Solitary pulmonary nodule: Secondary | ICD-10-CM

## 2011-01-17 MED ORDER — IOHEXOL 300 MG/ML  SOLN
80.0000 mL | Freq: Once | INTRAMUSCULAR | Status: AC | PRN
  Administered 2011-01-17: 80 mL via INTRAVENOUS

## 2011-01-21 ENCOUNTER — Other Ambulatory Visit: Payer: Self-pay | Admitting: Orthopedic Surgery

## 2011-01-21 ENCOUNTER — Telehealth: Payer: Self-pay | Admitting: Cardiology

## 2011-01-21 ENCOUNTER — Encounter (HOSPITAL_COMMUNITY): Payer: No Typology Code available for payment source | Attending: Orthopedic Surgery

## 2011-01-21 DIAGNOSIS — X58XXXA Exposure to other specified factors, initial encounter: Secondary | ICD-10-CM | POA: Insufficient documentation

## 2011-01-21 DIAGNOSIS — Z01812 Encounter for preprocedural laboratory examination: Secondary | ICD-10-CM | POA: Insufficient documentation

## 2011-01-21 DIAGNOSIS — S83289A Other tear of lateral meniscus, current injury, unspecified knee, initial encounter: Secondary | ICD-10-CM | POA: Insufficient documentation

## 2011-01-21 LAB — BASIC METABOLIC PANEL
CO2: 29 mEq/L (ref 19–32)
Chloride: 102 mEq/L (ref 96–112)
Creatinine, Ser: 0.61 mg/dL (ref 0.50–1.10)
GFR calc Af Amer: >60 mL/min (ref >60)
Sodium: 138 mEq/L (ref 135–145)

## 2011-01-21 LAB — DIFFERENTIAL
Basophils Absolute: 0 10*3/uL (ref 0.0–0.1)
Lymphocytes Relative: 27 % (ref 12–46)
Lymphs Abs: 1.4 10*3/uL (ref 0.7–4.0)
Neutro Abs: 3.2 10*3/uL (ref 1.7–7.7)
Neutrophils Relative %: 62 % (ref 43–77)

## 2011-01-21 LAB — CBC
HCT: 40.4 % (ref 36.0–46.0)
Hemoglobin: 12.6 g/dL (ref 12.0–15.0)
MCV: 82.3 fL (ref 78.0–100.0)
RBC: 4.91 MIL/uL (ref 3.87–5.11)
RDW: 16.3 % — ABNORMAL HIGH (ref 11.5–15.5)
WBC: 5.1 10*3/uL (ref 4.0–10.5)

## 2011-01-21 LAB — SURGICAL PCR SCREEN
MRSA, PCR: NEGATIVE
Staphylococcus aureus: POSITIVE — AB

## 2011-01-21 LAB — APTT: aPTT: 65 seconds — ABNORMAL HIGH (ref 24–37)

## 2011-01-21 LAB — PROTIME-INR: INR: 2.69 — ABNORMAL HIGH (ref 0.00–1.49)

## 2011-01-21 NOTE — Telephone Encounter (Signed)
All Cardiac faxed to [facility] [phone]  01/21/11/km

## 2011-01-27 ENCOUNTER — Encounter: Payer: Self-pay | Admitting: Internal Medicine

## 2011-01-27 ENCOUNTER — Ambulatory Visit (INDEPENDENT_AMBULATORY_CARE_PROVIDER_SITE_OTHER): Payer: Self-pay | Admitting: Pharmacist

## 2011-01-27 ENCOUNTER — Ambulatory Visit (INDEPENDENT_AMBULATORY_CARE_PROVIDER_SITE_OTHER): Payer: Self-pay | Admitting: Internal Medicine

## 2011-01-27 ENCOUNTER — Ambulatory Visit: Payer: Self-pay

## 2011-01-27 DIAGNOSIS — I2699 Other pulmonary embolism without acute cor pulmonale: Secondary | ICD-10-CM

## 2011-01-27 DIAGNOSIS — J984 Other disorders of lung: Secondary | ICD-10-CM

## 2011-01-27 DIAGNOSIS — Z7901 Long term (current) use of anticoagulants: Secondary | ICD-10-CM

## 2011-01-27 DIAGNOSIS — R911 Solitary pulmonary nodule: Secondary | ICD-10-CM

## 2011-01-27 DIAGNOSIS — M67919 Unspecified disorder of synovium and tendon, unspecified shoulder: Secondary | ICD-10-CM

## 2011-01-27 DIAGNOSIS — M719 Bursopathy, unspecified: Secondary | ICD-10-CM

## 2011-01-27 DIAGNOSIS — I10 Essential (primary) hypertension: Secondary | ICD-10-CM

## 2011-01-27 LAB — POCT INR: INR: 2.7

## 2011-01-27 NOTE — Assessment & Plan Note (Signed)
Stable. INR today was 2.7. Patient was seen by Dr. Alexandria Lodge and will have  a followup appointment in 03/03/2011. She is currently on any Coumadin at this time secondary to her upcoming surgery of the left knee on 01/31/2011 with Dr. Simonne Come.

## 2011-01-27 NOTE — Patient Instructions (Signed)
Please follow up with Dr. Anselm Jungling in 3-4 months

## 2011-01-27 NOTE — Assessment & Plan Note (Signed)
Patient has not been able to be seen by an upper extremity orthopedics because of her insurance issue. She continues to have limited range of motion of her right shoulder. We will continue conservative medical management at this time while waiting for an orthopedics referral.  She will continue to be seen at sports medicine clinic.

## 2011-01-27 NOTE — Assessment & Plan Note (Signed)
Repeat CT scan of chest on 01/17/1999 showed a stable 3 mm lung nodule of left upper lobe.  No further followup is needed. I discussed result with patient today

## 2011-01-27 NOTE — Progress Notes (Signed)
Anti-Coagulation Progress Note  Breanna Wilkins is a 50 y.o. female who is currently on an anti-coagulation regimen.    RECENT RESULTS: Recent results are below, the most recent result is correlated with a dose of 50 mg. per week: Lab Results  Component Value Date   INR 2.70 01/27/2011   INR 2.69* 01/21/2011   INR 2.2 12/30/2010    ANTI-COAG DOSE:   Latest dosing instructions   Total [name] Tue Wed Thu Fri Sat   50 5 mg 10 mg 5 mg 10 mg 5 mg 10 mg 5 mg    (10 mg0.5) (10 mg1) (10 mg0.5) (10 mg1) (10 mg0.5) (10 mg1) (10 mg0.5)         ANTICOAG SUMMARY: Anticoagulation Episode Summary              Current INR goal 2.0-3.0 Next INR check 03/03/2011   INR from last check 2.70 (01/27/2011)     Weekly max dose (mg)  Target end date Indefinite   Indications Long-term (current) use of anticoagulants, PE (pulmonary embolism)   INR check location [facility] Preferred lab    Send INR reminders to [facility]   Comments        Provider Role Specialty Phone number   Blanch Media  Internal Medicine [phone]        ANTICOAG TODAY: Anticoagulation Summary as of 01/27/2011              INR goal 2.0-3.0     Selected INR 2.70 (01/27/2011) Next INR check 03/03/2011   Weekly max dose (mg)  Target end date Indefinite   Indications Long-term (current) use of anticoagulants, PE (pulmonary embolism)    Anticoagulation Episode Summary              INR check location [facility] Preferred lab    Send INR reminders to [facility]   Comments        Provider Role Specialty Phone number   Blanch Media  Internal Medicine [phone]        PATIENT INSTRUCTIONS: Patient Instructions  Patient instructed to take medications as defined in the Anti-coagulation Track section of this encounter.  Patient instructed to take today's dose.  Patient verbalized understanding of these instructions.        FOLLOW-UP Return in 5 weeks (on 03/03/2011) for Follow up  INR.  Hulen Luster, III Pharm.D., CACP

## 2011-01-27 NOTE — Assessment & Plan Note (Signed)
Blood pressure is very well controlled without the Norvasc which I discontinued from last office visit. Will continue lisinopril/HCTZ.

## 2011-01-27 NOTE — Patient Instructions (Signed)
Patient instructed to take medications as defined in the Anti-coagulation Track section of this encounter.  Patient instructed to take today's dose.  Patient verbalized understanding of these instructions.    

## 2011-01-27 NOTE — Progress Notes (Signed)
  HPI: [name] is a 50 year old woman who is well known to me presents today for blood pressure check. She was taken off of Norvasc to her last office visit secondary to her bilateral legs the edema. Patient reports doing well lately and her last Warfarin dose was this past Sunday in preparation for her left knee surgery and states that her INR today is 2.7 per Dr. Alexandria Lodge.  She is scheduled to have knee surgery this Friday, 01/31/11 with Dr. Simonne Come.  Her SOB is stable and denies any chestpain. She is also here today to pick up her disability paperwork. She denies any other complaints at this time.   Disability form was given to patient with results of her MRI of her knee and shoulder. A copy of her disability form will be scanned into EPIC.   ROS: as per HPI  PE: General: alert, well-developed, and cooperative to examination.   Lungs: normal respiratory effort, no accessory muscle use, normal breath sounds, no crackles, and no wheezes. Heart: normal rate, regular rhythm, no murmur, no gallop, and no rub.  Abdomen: soft, non-tender, normal bowel sounds, no distention, no guarding, no rebound tenderness Msk: Limited range of motion of the knee and right shoulder secondary to pain, audible clicks on examination, no erythema or drainage. Positive for tenderness on palpation and movement  Pulses: 2+ DP/PT pulses bilaterally Extremities: No cyanosis, clubbing, trace edema Neurologic: alert & oriented X3, cranial nerves II-XII intact, strength normal in all extremities, sensation intact to light touch, and gait normal. .  Psych: Appropriate, normal affect

## 2011-01-30 ENCOUNTER — Telehealth: Payer: Self-pay | Admitting: *Deleted

## 2011-01-30 ENCOUNTER — Encounter: Payer: Self-pay | Admitting: Internal Medicine

## 2011-01-30 ENCOUNTER — Ambulatory Visit (INDEPENDENT_AMBULATORY_CARE_PROVIDER_SITE_OTHER): Payer: Self-pay | Admitting: Internal Medicine

## 2011-01-30 VITALS — BP 147/89 | HR 85 | Temp 97.0°F | Ht 67.0 in | Wt 277.8 lb

## 2011-01-30 DIAGNOSIS — W19XXXA Unspecified fall, initial encounter: Secondary | ICD-10-CM | POA: Insufficient documentation

## 2011-01-30 DIAGNOSIS — Z7901 Long term (current) use of anticoagulants: Secondary | ICD-10-CM

## 2011-01-30 DIAGNOSIS — I2699 Other pulmonary embolism without acute cor pulmonale: Secondary | ICD-10-CM

## 2011-01-30 LAB — POCT INR: INR: 1.3

## 2011-01-30 NOTE — Assessment & Plan Note (Signed)
Soft tissue swelling of hands with mild ecchymotic on palmer sides.  No evidence of fracture of wrists/fingers/arms therefore, Xray is not warranted at this time.  Full range of motion without any tenderness. She stopped taking her Coumadin 5 days ago in preparation for her knee surgery. -Advised heating/ice pads on hands as tolerated -Patient states that she does not want to take any pain medication at this time.   -She may proceed with her knee surgery tomorrow scheduled with Dr. Simonne Come. -Will check INR to make sure it is not supratherapeutic.  (INR 1.3)

## 2011-01-30 NOTE — Progress Notes (Signed)
HPI: Breanna Wilkins is a 50 year old woman with past medical history of hypertension, pulmonary embolus on long-term anticoagulant presents today for a fall. She stated that she fell at 5 PM yesterday when her pant got caught in the door which forced her to fall backward. She extended her arm and fell on her hands instead, which prevented her from hitting her head on the ground. She denies any loss of consciousness, bleeding diathesis, or any laceration. She has some hand swelling bilaterally but can move both of her hands/arms without any difficulty. She has been using ice pad to alleviate the tenderness but she refuses to take any pain medication because can still tolerate it.   Shas a report significant improvement with her short of breath now that the weather is cooler. She she has been off of her Coumadin in the past 5 days in preparation for her left knee surgery tomorrow 01/31/2004 with Dr. Simonne Come.    ROS: as per HPI  PE: General: pleasant, alert, well-developed, and cooperative to examination.   Lungs: normal respiratory effort, no accessory muscle use, normal breath sounds, no crackles, and no wheezes. Heart: normal rate, regular rhythm, no murmur, no gallop, and no rub.  Abdomen: soft, non-tender, normal bowel sounds, no distention, no guarding, no rebound tenderness Pulses: 2+ DP/PT pulses bilaterally Extremities: No cyanosis, clubbing.  Hands:  soft tissue swelling with mild ecchymotic area on palmar side, but no drainage, erythema or laceration.  Full range of motion of all finger joints, wrists, and arms.  Mild tenderness to palpation of soft tissue.  Neurologic: alert & oriented X3, cranial nerves II-XII intact, strength normal in all extremities, sensation intact to light touch, and gait normal.

## 2011-01-30 NOTE — Telephone Encounter (Signed)
Agree 

## 2011-01-30 NOTE — Telephone Encounter (Signed)
Pt called and states she fell yesterday and landed on her hand.  She takes coumadin but has not taken for the past 5 days because she has surgery tomorrow ( knee - orthoscopic ) Today her hands are bruised and look blue, swollen and painful.  Will see today at 1315

## 2011-01-31 ENCOUNTER — Ambulatory Visit (HOSPITAL_COMMUNITY)
Admission: RE | Admit: 2011-01-31 | Discharge: 2011-01-31 | Disposition: A | Discharge status: Home / Self Care | Payer: Self-pay | Source: Ambulatory Visit | Attending: Orthopedic Surgery | Admitting: Orthopedic Surgery

## 2011-01-31 DIAGNOSIS — IMO0002 Reserved for concepts with insufficient information to code with codable children: Secondary | ICD-10-CM | POA: Insufficient documentation

## 2011-01-31 DIAGNOSIS — Z01812 Encounter for preprocedural laboratory examination: Secondary | ICD-10-CM | POA: Insufficient documentation

## 2011-01-31 DIAGNOSIS — M25569 Pain in unspecified knee: Secondary | ICD-10-CM | POA: Insufficient documentation

## 2011-01-31 DIAGNOSIS — X58XXXA Exposure to other specified factors, initial encounter: Secondary | ICD-10-CM | POA: Insufficient documentation

## 2011-01-31 DIAGNOSIS — S83289A Other tear of lateral meniscus, current injury, unspecified knee, initial encounter: Secondary | ICD-10-CM | POA: Insufficient documentation

## 2011-01-31 LAB — APTT: aPTT: 29 seconds (ref 24–37)
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
## 2011-02-23 ENCOUNTER — Other Ambulatory Visit: Payer: Self-pay | Admitting: Internal Medicine

## 2011-03-03 ENCOUNTER — Ambulatory Visit: Payer: Self-pay

## 2011-03-04 ENCOUNTER — Ambulatory Visit: Payer: No Typology Code available for payment source | Attending: Orthopedic Surgery | Admitting: Physical Therapy

## 2011-03-04 ENCOUNTER — Ambulatory Visit (INDEPENDENT_AMBULATORY_CARE_PROVIDER_SITE_OTHER): Payer: Self-pay | Admitting: Pharmacist

## 2011-03-04 ENCOUNTER — Other Ambulatory Visit: Payer: Self-pay | Admitting: Pharmacist

## 2011-03-04 DIAGNOSIS — M25569 Pain in unspecified knee: Secondary | ICD-10-CM | POA: Insufficient documentation

## 2011-03-04 DIAGNOSIS — Z7901 Long term (current) use of anticoagulants: Secondary | ICD-10-CM

## 2011-03-04 DIAGNOSIS — IMO0001 Reserved for inherently not codable concepts without codable children: Secondary | ICD-10-CM | POA: Insufficient documentation

## 2011-03-04 DIAGNOSIS — I2699 Other pulmonary embolism without acute cor pulmonale: Secondary | ICD-10-CM

## 2011-03-04 DIAGNOSIS — M6281 Muscle weakness (generalized): Secondary | ICD-10-CM | POA: Insufficient documentation

## 2011-03-04 DIAGNOSIS — M25669 Stiffness of unspecified knee, not elsewhere classified: Secondary | ICD-10-CM | POA: Insufficient documentation

## 2011-03-04 LAB — POCT INR: INR: 3.8

## 2011-03-04 MED ORDER — WARFARIN SODIUM 10 MG PO TABS: ORAL_TABLET | ORAL | Status: DC

## 2011-03-04 NOTE — Patient Instructions (Signed)
Patient instructed to take medications as defined in the Anti-coagulation Track section of this encounter.  Patient instructed to OMIT/HOLD today's dose.  Patient verbalized understanding of these instructions.    

## 2011-03-04 NOTE — Progress Notes (Signed)
Anti-Coagulation Progress Note  Breanna Wilkins is a 50 y.o. female who is currently on an anti-coagulation regimen.    RECENT RESULTS: Recent results are below, the most recent result is correlated with a dose of 50 mg. per week: Lab Results  Component Value Date   INR 3.8 03/04/2011   INR 1.04 01/31/2011   INR 1.3 01/30/2011    ANTI-COAG DOSE:   Latest dosing instructions   Total [name] Tue Wed Thu Fri Sat   45 5 mg 10 mg 5 mg 5 mg 5 mg 10 mg 5 mg    (10 mg0.5) (10 mg1) (10 mg0.5) (10 mg0.5) (10 mg0.5) (10 mg1) (10 mg0.5)         ANTICOAG SUMMARY: Anticoagulation Episode Summary              Current INR goal 2.0-3.0 Next INR check 03/17/2011   INR from last check 3.8! (03/04/2011)     Weekly max dose (mg)  Target end date Indefinite   Indications Long-term (current) use of anticoagulants, PE (pulmonary embolism)   INR check location [facility] Preferred lab    Send INR reminders to [facility]   Comments        Provider Role Specialty Phone number   Blanch Media  Internal Medicine [phone]        ANTICOAG TODAY: Anticoagulation Summary as of 03/04/2011              INR goal 2.0-3.0     Selected INR 3.8! (03/04/2011) Next INR check 03/17/2011   Weekly max dose (mg)  Target end date Indefinite   Indications Long-term (current) use of anticoagulants, PE (pulmonary embolism)    Anticoagulation Episode Summary              INR check location [facility] Preferred lab    Send INR reminders to [facility]   Comments        Provider Role Specialty Phone number   Blanch Media  Internal Medicine [phone]        PATIENT INSTRUCTIONS: Patient Instructions  Patient instructed to take medications as defined in the Anti-coagulation Track section of this encounter.  Patient instructed to OMIT/HOLD today's dose.  Patient verbalized understanding of these instructions.        FOLLOW-UP Return in 2 weeks (on 03/17/2011) for Follow up  INR.  Hulen Luster, III Pharm.D., CACP

## 2011-03-04 NOTE — Progress Notes (Signed)
Review of the EPIC records suggest that Breanna Wilkins had a provoked PE in June 2011.  She subsequently has had a documented fall.  I will ask Dr. Anselm Jungling to review the history and, if she did have a single PE in June 2011 that was provoked, consider discussing with Breanna Wilkins the risks and benefits of stopping coumadin therapy at this point.

## 2011-03-10 ENCOUNTER — Ambulatory Visit: Payer: No Typology Code available for payment source | Admitting: Physical Therapy

## 2011-03-12 ENCOUNTER — Ambulatory Visit: Payer: No Typology Code available for payment source | Admitting: Physical Therapy

## 2011-03-17 ENCOUNTER — Ambulatory Visit (INDEPENDENT_AMBULATORY_CARE_PROVIDER_SITE_OTHER): Payer: Self-pay | Admitting: Pharmacist

## 2011-03-17 ENCOUNTER — Encounter: Payer: Self-pay | Admitting: Internal Medicine

## 2011-03-17 ENCOUNTER — Inpatient Hospital Stay (HOSPITAL_COMMUNITY)
Admission: AD | Admit: 2011-03-17 | Discharge: 2011-03-19 | DRG: 300 | Disposition: A | Discharge status: Home / Self Care | Payer: No Typology Code available for payment source | Source: Ambulatory Visit | Attending: Internal Medicine | Admitting: Internal Medicine

## 2011-03-17 ENCOUNTER — Ambulatory Visit (INDEPENDENT_AMBULATORY_CARE_PROVIDER_SITE_OTHER): Payer: No Typology Code available for payment source | Admitting: Internal Medicine

## 2011-03-17 ENCOUNTER — Encounter: Payer: Self-pay | Admitting: Ophthalmology

## 2011-03-17 ENCOUNTER — Inpatient Hospital Stay (HOSPITAL_COMMUNITY): Payer: No Typology Code available for payment source

## 2011-03-17 ENCOUNTER — Ambulatory Visit (HOSPITAL_COMMUNITY)
Admission: RE | Admit: 2011-03-17 | Discharge: 2011-03-17 | Disposition: A | Discharge status: Home / Self Care | Payer: No Typology Code available for payment source | Source: Ambulatory Visit | Attending: Internal Medicine | Admitting: Internal Medicine

## 2011-03-17 DIAGNOSIS — Z01810 Encounter for preprocedural cardiovascular examination: Secondary | ICD-10-CM

## 2011-03-17 DIAGNOSIS — M79609 Pain in unspecified limb: Secondary | ICD-10-CM

## 2011-03-17 DIAGNOSIS — Z7901 Long term (current) use of anticoagulants: Secondary | ICD-10-CM

## 2011-03-17 DIAGNOSIS — Z86711 Personal history of pulmonary embolism: Secondary | ICD-10-CM

## 2011-03-17 DIAGNOSIS — R609 Edema, unspecified: Secondary | ICD-10-CM

## 2011-03-17 DIAGNOSIS — R600 Localized edema: Secondary | ICD-10-CM

## 2011-03-17 DIAGNOSIS — I2699 Other pulmonary embolism without acute cor pulmonale: Secondary | ICD-10-CM

## 2011-03-17 DIAGNOSIS — I10 Essential (primary) hypertension: Secondary | ICD-10-CM | POA: Diagnosis present

## 2011-03-17 DIAGNOSIS — K219 Gastro-esophageal reflux disease without esophagitis: Secondary | ICD-10-CM | POA: Diagnosis present

## 2011-03-17 DIAGNOSIS — M7989 Other specified soft tissue disorders: Secondary | ICD-10-CM

## 2011-03-17 DIAGNOSIS — Z6841 Body Mass Index (BMI) 40.0 and over, adult: Secondary | ICD-10-CM

## 2011-03-17 DIAGNOSIS — G4733 Obstructive sleep apnea (adult) (pediatric): Secondary | ICD-10-CM | POA: Diagnosis present

## 2011-03-17 DIAGNOSIS — I824Y9 Acute embolism and thrombosis of unspecified deep veins of unspecified proximal lower extremity: Principal | ICD-10-CM | POA: Diagnosis present

## 2011-03-17 DIAGNOSIS — R0602 Shortness of breath: Secondary | ICD-10-CM

## 2011-03-17 DIAGNOSIS — Z86718 Personal history of other venous thrombosis and embolism: Secondary | ICD-10-CM | POA: Diagnosis present

## 2011-03-17 LAB — CARDIAC PANEL(CRET KIN+CKTOT+MB+TROPI)
CK, MB: 1.7 ng/mL (ref 0.3–4.0)
Relative Index: INVALID (ref 0.0–2.5)
Total CK: 68 U/L (ref 7–177)
Troponin I: <0.3 ng/mL (ref <0.30)

## 2011-03-17 LAB — CBC
Hemoglobin: 11.7 g/dL — ABNORMAL LOW (ref 12.0–15.0)
MCH: 26.1 pg (ref 26.0–34.0)
MCHC: 32.2 g/dL (ref 30.0–36.0)
MCV: 80.8 fL (ref 78.0–100.0)
RBC: 4.49 MIL/uL (ref 3.87–5.11)

## 2011-03-17 LAB — BASIC METABOLIC PANEL
Calcium: 9.2 mg/dL (ref 8.4–10.5)
Creatinine, Ser: 0.68 mg/dL (ref 0.50–1.10)
GFR calc Af Amer: >60 mL/min (ref >60)
GFR calc non Af Amer: >60 mL/min (ref >60)
Sodium: 139 mEq/L (ref 135–145)

## 2011-03-17 LAB — APTT: aPTT: 77 seconds — ABNORMAL HIGH (ref 24–37)

## 2011-03-17 MED ORDER — IOHEXOL 300 MG/ML  SOLN
100.0000 mL | Freq: Once | INTRAMUSCULAR | Status: AC | PRN
  Administered 2011-03-17: 100 mL via INTRAVENOUS

## 2011-03-17 NOTE — Assessment & Plan Note (Signed)
Well-controlled on current medication regimen. We'll continue the same. Electrolytes recently checked and were within normal limits.

## 2011-03-17 NOTE — Progress Notes (Signed)
Anti-Coagulation Progress Note  Breanna Wilkins is a 50 y.o. female who is currently on an anti-coagulation regimen.    RECENT RESULTS: Recent results are below, the most recent result is correlated with a dose of 45 mg. per week: Lab Results  Component Value Date   INR 4.4 03/17/2011   INR 3.8 03/04/2011   INR 1.04 01/31/2011    ANTI-COAG DOSE:   Latest dosing instructions   Total [name] Tue Wed Thu Fri Sat   45 5 mg 10 mg 5 mg 5 mg 5 mg 10 mg 5 mg    (10 mg0.5) (10 mg1) (10 mg0.5) (10 mg0.5) (10 mg0.5) (10 mg1) (10 mg0.5)         ANTICOAG SUMMARY:   ANTICOAG TODAY:   PATIENT INSTRUCTIONS: There are no Patient Instructions on file for this visit.   FOLLOW-UP Return in 9 days (on 03/26/2011) for Follow up INR.  Hulen Luster, III Pharm.D., CACP

## 2011-03-17 NOTE — Assessment & Plan Note (Addendum)
This appears to be chronic problem for patient. I have discussed this case with attending Dr. Josem Kaufmann and we have agreed given patient's symptoms of pain in the left lower extremity we will obtain lower extremity Dopplers stat for analysis is a presence of a clot. Patient's vitals are stable and oxygen saturation is stable on room air. He did not think that there is a need for a CPE a the lungs. Patient is currently anticoagulated with INR around 4 on today's visit. Plan per Dr. Alexandria Lodge is to temporarily hold Coumadin until we obtain tests results.  Addendum: Lower extremity Dopplers significant for popliteal DVT in the left extremity. Discussed case with Dr. Josem Kaufmann. Agreed upon admitting the patient for further management and evaluation.

## 2011-03-17 NOTE — Progress Notes (Signed)
I briefly evaluated [name].  She notes LLE swelling that is asymmetric and atypical for her.  She also notes difficulty taking a deep breath but is vague about chest pain, orthopnea, or PND.  She denies post-op trauma to the leg.  From the look of her scars she had an arthroscopic procedure but she is very unclear as to the exact date of the procedure.  Examination reveals an obese woman sitting comfortably in a chair in NAD.  She is able to converse without any visible dyspnea.  She has LLE edema (1+) which is asymmetric when compared to the RLE which is w/o edema.  Here is no calf tenderness on the left or palpable cords.  Of note, her INR today is 4.4 and she states she was only off of the coumadin for 1 day post-op.  We will work her into clinic this morning for a formal evaluation to include a LLE doppler examination to rule out a DVT or hematoma post-op.
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
## 2011-03-17 NOTE — H&P (Signed)
Hospital Admission Note Date: 03/17/2011  Patient name:  Breanna Wilkins  Medical record number:  [id number] Date of birth:  [birth year]  Age: 50 y.o. Gender:  female PCP:    Carrolyn Meiers, MD  Medical Service:   Internal Medicine Teaching Service   Attending physician:  Dr. Ulyess Mort First Contact:   Dr. Candy Sledge  Pager: [phone] Second Contact:   Dr. Gilford Rile  Pager: [phone] After Hours:    First Contact   Pager: [phone]      Second Contact  Pager: [phone]   Chief Complaint:  History of Present Illness:  The patient is a 50 year old female with past medical history outlined below which also includes history of pulmonary embolus for which reason she is on anticoagulation. She presents to [facility] for regular followup on Coumadin dosing but mentions at the appointment that she has been having worsening left lower extremity pain and swelling in the knee associated with tenderness in the left knee area and more swelling compared to the right lower extremity. She reports having surgery on her left knee approximately month and half ago. She follows with her regular orthopedic doctor tomorrow 03/18/2011. In addition she reports occasional and intermittent shortness of breath, not associated with specific activities, can occur at rest or with activity, started approximately a week ago and gives her anxiety because she feels she will not be able to breathe. She denies fevers and chills, chest pain, cough, headaches or dizziness, no visual changes. In addition she denies abdominal or urinary concerns, no numbness or tingling in the extremities, no weakness.    Current Outpatient Medications: Coumadin Zolpidem Lisinopril-HCTZ  Allergies: Aspirin  Past Medical History: Past Medical History  Diagnosis Date  . Shoulder pain   . Pulmonary embolus 01/16/10    on Coumadin  . OSA (obstructive sleep apnea)   . Abdominal hernia   . Hypertension   . Overweight   . Lung nodule 12/2009   Very small, left upper lobe by CT June, 2011, needs one-year followup  . Shortness of breath   . Warfarin anticoagulation   . Ejection fraction     65-70%, vigorous function, echo, March, 2011    Past Surgical History: Past Surgical History  Procedure Date  . Abdominal hysterectomy 07/2002    laparoscopic assisted vaginal hysterectomy for menorrhagia, dysmenorrhea, anemia, fibroids    Family History: Family History  Problem Relation Age of Onset  . Diabetes Father   . Diabetes Sister   . Diabetes Mother     Social History: History   Social History  . Marital Status: Single    Spouse Name: N/A    Number of Children: N/A  . Years of Education: N/A   Occupational History  . Not on file.   Social History Main Topics  . Smoking status: Former Games developer  . Smokeless tobacco: Not on file  . Alcohol Use: No  . Drug Use: No  . Sexually Active: Not on file   Other Topics Concern  . Not on file   Social History Narrative   Financial assistance approved for 100% discount at [facility] and has [facility] card per Gavin [place]6/01/2010    Review of Systems: Pertinent items are noted in HPI.  Vital Signs: T: 97.6 P: 69 BP: 188/88 RR: 24 O2 sat: 99% on RA   Physical Exam: Constitutional: Vital signs reviewed. Patient is a well-developed and well-nourished in no acute distress and cooperative with exam. Alert and oriented x3.  Head: Normocephalic and atraumatic  Ear: TM normal bilaterally  Mouth: no erythema or exudates, MMM  Eyes: PERRL, EOMI, conjunctivae normal, No scleral icterus.  Neck: Supple, Trachea midline normal ROM, No JVD, mass, thyromegaly, or carotid bruit present.  Cardiovascular: RRR, S1 normal, S2 normal, no MRG, pulses symmetric and intact bilaterally  Pulmonary/Chest: CTAB, no wheezes, rales, or rhonchi  Abdominal: Soft. Non-tender, non-distended, bowel sounds are normal, no masses, organomegaly, or guarding present.  Musculoskeletal: Left knee is swollen and tender  to palpation, not erythematous and not significantly warmer compared to the right knee. No other joint swelling noted on examination. Full range of motion in all joints noted. Hematology: no cervical, inginal, or axillary adenopathy.  Neurological: A&O x3, Strenght is normal and symmetric bilaterally, cranial nerve II-XII are grossly intact, no focal motor deficit, sensory intact to light touch bilaterally.  Skin: Warm, dry and intact. No rash, cyanosis, or clubbing. +1 to +2 pitting edema in bilateral lower extremities. Psychiatric: Normal mood and affect. speech and behavior is normal. Judgment and thought content normal. Cognition and memory are normal.      Lab results: CBC:    Component Value Date/Time   WBC 5.1 01/21/2011 1340   HGB 12.6 01/21/2011 1340   HCT 40.4 01/21/2011 1340   PLT 275 01/21/2011 1340   MCV 82.3 01/21/2011 1340   NEUTROABS 3.2 01/21/2011 1340   LYMPHSABS 1.4 01/21/2011 1340   MONOABS 0.5 01/21/2011 1340   EOSABS 0.1 01/21/2011 1340   BASOSABS 0.0 01/21/2011 1340      Comprehensive Metabolic Panel:    Component Value Date/Time   NA 138 01/21/2011 1340   K 3.5 01/21/2011 1340   CL 102 01/21/2011 1340   CO2 29 01/21/2011 1340   BUN 13 01/21/2011 1340   CREATININE 0.61 01/21/2011 1340   CREATININE 0.75 01/14/2011 1613   GLUCOSE 92 01/21/2011 1340   CALCIUM 9.9 01/21/2011 1340   AST 19 01/16/2010 1946   ALT 16 01/16/2010 1946   ALKPHOS 66 01/16/2010 1946   BILITOT 0.5 01/16/2010 1946   PROT 7.1 01/16/2010 1946   ALBUMIN 3.3* 01/16/2010 1946     No results found for this basename: CKTOTAL, CKMB, CKMBINDEX, TROPONINI      Imaging results:    Assessment & Plan:    DVT PPX:      (PGY1):  ____________________________________    Date/ Time:      ____________________________________     Lars Mage, M.D. (Senior resident):    ____________________________________    Date/ Time:      ____________________________________     I have seen and examined the patient. I  reviewed the resident/fellow note and agree with the findings and plan of care as documented. My additions and revisions are included.   Signature:  ____________________________________________     Internal Medicine Teaching Service Attending    Date:    ____________________________________________

## 2011-03-17 NOTE — Progress Notes (Signed)
  Subjective:    Patient ID: Breanna Wilkins, female    DOB: [birth year], 50 y.o.   MRN: [id number]  HPI The patient is a 50 year old female with past medical history outlined below which also includes history of pulmonary embolus for which reason she is on anticoagulation. She presents to [facility] clinic for regular followup on Coumadin dosing but mentions at the appointment that she has been having worsening left lower extremity pain and swelling in the knee associated with tenderness in the left knee area and more swelling compared to the right lower extremity. She reports having surgery on her left knee approximately month and half ago. She follows with her regular orthopedic doctor tomorrow 03/18/2011. In addition she reports occasional and intermittent shortness of breath, not associated with specific activities, can occur at rest or with activity, started approximately a week ago and gives her anxiety because she feels she will not be able to breathe. She denies fevers and chills, chest pain, cough, headaches or dizziness, no visual changes. In addition she denies abdominal or urinary concerns, no numbness or tingling in the extremities, no weakness.   Review of Systems Per HPI    Objective:   Physical Exam  Constitutional: Vital signs reviewed.  Patient is a well-developed and well-nourished in no acute distress and cooperative with exam. Alert and oriented x3.  Head: Normocephalic and atraumatic Ear: TM normal bilaterally Mouth: no erythema or exudates, MMM Eyes: PERRL, EOMI, conjunctivae normal, No scleral icterus.  Neck: Supple, Trachea midline normal ROM, No JVD, mass, thyromegaly, or carotid bruit present.  Cardiovascular: RRR, S1 normal, S2 normal, no MRG, pulses symmetric and intact bilaterally Pulmonary/Chest: CTAB, no wheezes, rales, or rhonchi Abdominal: Soft. Non-tender, non-distended, bowel sounds are normal, no masses, organomegaly, or guarding present.  Musculoskeletal:  Left knee is swollen and tender to palpation, not erythematous and not significantly warmer compared to the right knee. No other joint swelling noted on examination. Full range of motion in all joints noted. Hematology: no cervical, inginal, or axillary adenopathy.  Neurological: A&O x3, Strenght is normal and symmetric bilaterally, cranial nerve II-XII are grossly intact, no focal motor deficit, sensory intact to light touch bilaterally.  Skin: Warm, dry and intact. No rash, cyanosis, or clubbing.  +1 to +2 pitting edema in bilateral lower extremities. Psychiatric: Normal mood and affect. speech and behavior is normal. Judgment and thought content normal. Cognition and memory are normal.         Assessment & Plan:

## 2011-03-17 NOTE — Progress Notes (Signed)
Saline Lock inserted in rt hand with # 22 angiocath.  Irrigates well. Pt admitted to tele.

## 2011-03-18 LAB — DRUGS OF ABUSE SCREEN W/O ALC, ROUTINE URINE
Cocaine Metabolites: NEGATIVE
Creatinine,U: 154.9 mg/dL
Marijuana Metabolite: NEGATIVE
Methadone: NEGATIVE
Opiate Screen, Urine: NEGATIVE
Propoxyphene: NEGATIVE

## 2011-03-18 LAB — PROTEIN C ACTIVITY: Protein C Activity: <15 % (ref 75–133)

## 2011-03-18 LAB — CBC
HCT: 35.8 % — ABNORMAL LOW (ref 36.0–46.0)
MCH: 25.7 pg — ABNORMAL LOW (ref 26.0–34.0)
MCV: 81.4 fL (ref 78.0–100.0)
RDW: 17.5 % — ABNORMAL HIGH (ref 11.5–15.5)
WBC: 6.1 10*3/uL (ref 4.0–10.5)

## 2011-03-18 LAB — HEPARIN LEVEL (UNFRACTIONATED)
Heparin Unfractionated: 0.25 IU/mL — ABNORMAL LOW (ref 0.30–0.70)
Heparin Unfractionated: 0.31 IU/mL (ref 0.30–0.70)

## 2011-03-18 LAB — LUPUS ANTICOAGULANT PANEL
DRVVT: 83.1 secs — ABNORMAL HIGH (ref 34.1–42.2)
Lupus Anticoagulant: NOT DETECTED
PTTLA 4:1 Mix: 42.5 secs (ref 30.0–45.6)
dRVVT Incubated 1:1 Mix: 37.5 secs (ref 34.1–42.2)

## 2011-03-18 LAB — CARDIAC PANEL(CRET KIN+CKTOT+MB+TROPI)
CK, MB: 1.5 ng/mL (ref 0.3–4.0)
CK, MB: 1.5 ng/mL (ref 0.3–4.0)
Total CK: 61 U/L (ref 7–177)
Total CK: 63 U/L (ref 7–177)
Troponin I: <0.3 ng/mL (ref <0.30)

## 2011-03-18 LAB — ANTITHROMBIN III: AntiThromb III Func: 109 % (ref 76–126)

## 2011-03-18 LAB — TSH: TSH: 0.868 u[IU]/mL (ref 0.350–4.500)

## 2011-03-19 ENCOUNTER — Encounter: Payer: No Typology Code available for payment source | Admitting: Physical Therapy

## 2011-03-19 LAB — PROTIME-INR: Prothrombin Time: 30.2 seconds — ABNORMAL HIGH (ref 11.6–15.2)

## 2011-03-19 LAB — CBC
MCH: 26 pg (ref 26.0–34.0)
MCV: 81.1 fL (ref 78.0–100.0)
Platelets: 280 10*3/uL (ref 150–400)
RDW: 17.6 % — ABNORMAL HIGH (ref 11.5–15.5)
WBC: 6.2 10*3/uL (ref 4.0–10.5)

## 2011-03-19 LAB — CARDIOLIPIN ANTIBODIES, IGG, IGM, IGA
Anticardiolipin IgG: 4 GPL U/mL — ABNORMAL LOW (ref <23)
Anticardiolipin IgM: 1 MPL U/mL — ABNORMAL LOW (ref <11)

## 2011-03-19 LAB — BASIC METABOLIC PANEL
BUN: 12 mg/dL (ref 6–23)
Calcium: 9.9 mg/dL (ref 8.4–10.5)
GFR calc non Af Amer: >60 mL/min (ref >60)
Glucose, Bld: 104 mg/dL — ABNORMAL HIGH (ref 70–99)

## 2011-03-19 LAB — BETA-2-GLYCOPROTEIN I ABS, IGG/M/A: Beta-2 Glyco I IgG: 8 G Units (ref <20)

## 2011-03-20 DIAGNOSIS — Z86718 Personal history of other venous thrombosis and embolism: Secondary | ICD-10-CM | POA: Diagnosis present

## 2011-03-20 LAB — PROTEIN C, TOTAL: Protein C, Total: 73 % (ref 72–160)

## 2011-03-20 LAB — PROTEIN S, TOTAL: Protein S Ag, Total: 43 % — ABNORMAL LOW (ref 60–150)

## 2011-03-20 NOTE — Discharge Summary (Signed)
Admitted for recurrent DVT on supratherapeutic levels of Coumadin. Negative for PE on CTA.  Patient's goal INR increased to 2.5-3.5. Total course of therapy one year from her admission 03/18/2011. After this year she should have thrombophilia panel on at least 1 to 2 months after she has discontinued Coumadin.  Please followup on the following items at her clinic appointment: #1. Her pressure after Increased dose of lisinopril and HCTZ #2. Please check BMET #3. Followup regarding omeprazole therapy as the patient was started on this medication during her hospitalization. #4 please check INR #5 please have the patient meet with Jamison Neighbor regarding dietary counseling for morbid obesity and perhaps enrollment in the "get fit" class
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
## 2011-03-20 NOTE — Discharge Summary (Signed)
Physician Discharge Summary  Patient ID: Breanna Wilkins MRN: [id number] DOB/AGE: October 13, 1960 50 y.o.  Admit date: 03/17/2011 Discharge date: 03/19/2011  Admission Diagnoses:  1. Left popliteal deep vein thrombosis. 2. Hypertension. 3. Obese with BMI of 44. 4. Gastroesophageal reflux disease. 5. Obstructive sleep apnea. 6. Status post left knee meniscal repair.  Discharge Diagnoses:  Active Problems:  Popliteal DVT (deep venous thrombosis)   Discharged Condition:     Breanna Wilkins was discharged from [facility] on March 19, 2011, in stable and improved condition.  She had no shortness of breath, chest pain, or other complaints.  Her left lower extremity edema had improved very much.  She is to continue taking Coumadin for DVT prophylaxis and PE prophylaxis.  She has the following followup appointments.  She has an appointment with her PCP Dr. Anselm Jungling on April 01, 2011, at that time, the following things need to be addressed: 1. Follow up BP as we increased dose of HCTZ and lisinopril. 2. Please check a BMET. 3. Follow up regarding omeprazole therapy.  The patient was started on this medication during this hospitalization.   4. Please check INR. The patient's goal INR was increased from 2.5 to 3.5.  Breanna Wilkins also has a followup appointment with Dr. Michaell Cowing in the [facility]madin Clinic on March 25, 2011, at that time, she should   have her INR checked. 5. Please have the patient follow up with Jamison Neighbor regarding  potential dietary counseling and perhaps enrollment in the Get Fit class. 6. Please ask about home CPAP use as it appears she is not always compliant.  Hospital Course:    1. Left lower extremity DVT.  Breanna Wilkins was admitted for recurrent DVT on supratherapeutic levels of Coumadin.  Hypercoagulability  panel was ordered though most of these tests will be non-diagnostic on Coumadin.  However, prothrombin mutation will still be of use, this was negative.  Factor V  Leiden also will be diagnostic,  however, this is still pending at the time of discharge.  Breanna Wilkins goal INR was increased from 2.5 to 3.5 with a plan of a 1-     year therapy at the conclusion of that year, she should be     discontinued from Coumadin, and approximately 1 month thereafter,     she should have a repeat of hypercoagulability panel. 2. Hypertension.  Breanna Wilkins's blood pressure during her admission     was elevated to the 180s/90s as such her dose of lisinopril and     HCTZ were doubled to lisinopril 40 and HCTZ 25 mg daily.  This     yielded a satisfactory decrease in her blood pressure to 145/99.     She will need further titration and followup appointment in     September: 3. Morbidly obese with BMI of 44.  Breanna Wilkins was advised to decrease     caloric intake as well as increase her activity.  In the acute     setting, she is not to engage in vigorous activity as she has DVT     in her left leg, however, she was encouraged to do mild activity     during this time and as her DVT resolved, she is to increase     activity.  She is to have dietary counseling at her followup     appointment with her PCP. 4. Gastroesophageal reflux disease.  Breanna Wilkins stated that her     shortness of breath was worse  at night often waking her up,     however, as her O2 levels were high during her admission, a trial     of empiric PPI was initiated.  This resolved in rapid decrease of     shortness of breath symptoms as such she was started on omeprazole     20 mg daily at discharge.  5. Obstructive sleep apnea.  Breanna Wilkins use of CPAP is unclear at this time.  6. Status post meniscal repair of the left knee.  The patient reports greatly decreased pain since her surgery, however, it may have impacted local vascular anatomy predisposing her to  DVT.  DISCHARGE VITALS:  Temperature 97.4, pulse 84, blood pressure 145/99, respirations 20, O2 sat 99% on room air.  DISCHARGE LABS:  Sodium  139, potassium 3.8, chloride 100, bicarb 29, BUN 12, creatinine 0.67, glucose 104, INR 2.8.  CBC:  White blood cell count 6.2, hemoglobin 12, hematocrit 37.4, MCV 81, platelet count 280.  As previously mentioned negative prothrombin G mutation.  Factor V Leiden pending.    Disposition: Home or Self Care  Discharge Orders    Future Appointments: Provider: Department: Dept Phone: Center:   03/25/2011 10:00 AM [facility] [phone] [facility]   04/01/2011 3:45 PM Carrolyn Meiers [facility] [phone] [facility]        Signed: Jazon Jipson 03/20/2011, 12:10 PM

## 2011-03-25 ENCOUNTER — Ambulatory Visit (INDEPENDENT_AMBULATORY_CARE_PROVIDER_SITE_OTHER): Payer: No Typology Code available for payment source | Admitting: Pharmacist

## 2011-03-25 ENCOUNTER — Encounter: Payer: Self-pay | Admitting: Internal Medicine

## 2011-03-25 DIAGNOSIS — I82409 Acute embolism and thrombosis of unspecified deep veins of unspecified lower extremity: Secondary | ICD-10-CM

## 2011-03-25 DIAGNOSIS — Z7901 Long term (current) use of anticoagulants: Secondary | ICD-10-CM

## 2011-03-25 LAB — POCT INR: INR: 2.6

## 2011-03-25 NOTE — Patient Instructions (Signed)
Patient instructed to take medications as defined in the Anti-coagulation Track section of this encounter.  Patient instructed to take today's dose.  Patient verbalized understanding of these instructions.    

## 2011-03-25 NOTE — Progress Notes (Signed)
Anti-Coagulation Progress Note  Breanna Wilkins is a 50 y.o. female who is currently on an anti-coagulation regimen.    RECENT RESULTS: Recent results are below, the most recent result is correlated with a dose of 40 mg. per week: Lab Results  Component Value Date   INR 2.60 03/25/2011   INR 2.83* 03/19/2011   INR 2.69* 03/18/2011    ANTI-COAG DOSE:   Latest dosing instructions   Total [name]   40 5 mg 5 mg 5 mg 5 mg 5 mg 10 mg 5 mg    (10 mg0.5) (10 mg0.5) (10 mg0.5) (10 mg0.5) (10 mg0.5) (10 mg1) (10 mg0.5)         ANTICOAG SUMMARY: Anticoagulation Episode Summary              Current INR goal 2.0-3.0 Next INR check 03/31/2011   INR from last check 2.60 (03/25/2011)     Weekly max dose (mg)  Target end date    Indications    INR check location [facility] Preferred lab    Send INR reminders to    Comments             ANTICOAG TODAY: Anticoagulation Summary as of 03/25/2011              INR goal 2.0-3.0     Selected INR 2.60 (03/25/2011) Next INR check 03/31/2011   Weekly max dose (mg)  Target end date    Indications     Anticoagulation Episode Summary              INR check location [facility] Preferred lab    Send INR reminders to    Comments             PATIENT INSTRUCTIONS: Patient Instructions  Patient instructed to take medications as defined in the Anti-coagulation Track section of this encounter.  Patient instructed to take today's dose.  Patient verbalized understanding of these instructions.        FOLLOW-UP Return in 6 days (on 03/31/2011) for Follow up INR.  Hulen Luster, III Pharm.D., CACP

## 2011-03-31 ENCOUNTER — Ambulatory Visit: Payer: No Typology Code available for payment source

## 2011-04-01 ENCOUNTER — Ambulatory Visit (INDEPENDENT_AMBULATORY_CARE_PROVIDER_SITE_OTHER): Payer: No Typology Code available for payment source | Admitting: Internal Medicine

## 2011-04-01 ENCOUNTER — Encounter: Payer: Self-pay | Admitting: Internal Medicine

## 2011-04-01 VITALS — BP 164/88 | HR 89 | Temp 98.2°F | Ht 67.0 in | Wt 285.7 lb

## 2011-04-01 DIAGNOSIS — Z7901 Long term (current) use of anticoagulants: Secondary | ICD-10-CM

## 2011-04-01 DIAGNOSIS — I10 Essential (primary) hypertension: Secondary | ICD-10-CM

## 2011-04-01 LAB — POCT INR: INR: 3.6

## 2011-04-01 MED ORDER — AMLODIPINE BESYLATE 5 MG PO TABS: 5.0000 mg | ORAL_TABLET | Freq: Every day | ORAL | Status: DC

## 2011-04-01 NOTE — Assessment & Plan Note (Signed)
Not well controlled.  BP on arrival was 168/84 and repeat BP was 145/82.   -Will continue Hctz-lisinopril 25/40mg  once daily -Add back her Amlodipine 5mg  po qd (she was on this med in the past and was taken off because her BP was well-controlled) -Recheck BP in 3 weeks

## 2011-04-01 NOTE — Assessment & Plan Note (Addendum)
Stable. Hypercoagulable panel showed low Protein S; however, patient has been on chronic Coumadin.  Factor V Leiden was negative.  Will continue anticoagulation for 1 year and then stop for 1 month, then recheck hypercoag panel.   -Will check INR today: 3.2 -Continue Coumadin and follow up with Dr. Alexandria Lodge.

## 2011-04-01 NOTE — Progress Notes (Signed)
HPI: [name] is a 50 yo woman with PMH of HTN and DVT on chronic anticoagulation presents today for follow up.  She was recently admitted to the hospital for a left popliteal DVT even with supratherapeutic INR level.  She reports feeling better and that she does not have any SOB.  She does reports bruising on her left knee area.  Denies any fever, chills, chestpain.  She has been taking her INR since hospital discharge but she was not able to come to her appointment yesterday with Dr. Alexandria Lodge.  As for her HTN, she states that she did not take her hctz/lisinopril this morning.  She is on a diet right now and is not interested in talking to [employer].  She does have a toothache which requires her to take pain meds but did not specify which one.  No other complaints today.  ROS: as per HPI  PE: General: alert, well-developed, and cooperative to examination.  Lungs: normal respiratory effort, no accessory muscle use, normal breath sounds, no crackles, and no wheezes. Heart: normal rate, regular rhythm, no murmur, no gallop, and no rub.  Abdomen: soft, non-tender, normal bowel sounds, no distention, no guarding, no rebound tenderness  Msk: no joint swelling, no joint warmth, and no redness over joints.  Pulses: 2+ DP/PT pulses bilaterally Extremities: Left knee ecchymosis, mild knee tenderness and limited ROM Neurologic: alert & oriented X3, cranial nerves II-XII intact, strength normal in all extremities, sensation intact to light touch, and gait normal.   Psych: Oriented X3, memory intact for recent and remote, normally interactive, good eye contact, not anxious appearing, and not depressed appearing.

## 2011-04-01 NOTE — Patient Instructions (Signed)
Start taking Amlodipine 5mg  one tablet daily Get labs today and I will call you with any abnormal results Follow up in 4 weeks

## 2011-04-02 LAB — BASIC METABOLIC PANEL WITH GFR
CO2: 29 mEq/L (ref 19–32)
Chloride: 103 mEq/L (ref 96–112)
GFR, Est Non African American: >60 mL/min (ref >60)
Potassium: 4.3 mEq/L (ref 3.5–5.3)

## 2011-04-07 ENCOUNTER — Ambulatory Visit (INDEPENDENT_AMBULATORY_CARE_PROVIDER_SITE_OTHER): Payer: No Typology Code available for payment source | Admitting: Pharmacist

## 2011-04-07 DIAGNOSIS — Z7901 Long term (current) use of anticoagulants: Secondary | ICD-10-CM

## 2011-04-07 DIAGNOSIS — I2699 Other pulmonary embolism without acute cor pulmonale: Secondary | ICD-10-CM

## 2011-04-07 LAB — POCT INR: INR: 3.5

## 2011-04-07 NOTE — Patient Instructions (Signed)
Patient instructed to take medications as defined in the Anti-coagulation Track section of this encounter.  Patient instructed to take today's dose.  Patient verbalized understanding of these instructions.    

## 2011-04-07 NOTE — Progress Notes (Signed)
Anti-Coagulation Progress Note  Breanna Wilkins is a 50 y.o. female who is currently on an anti-coagulation regimen.    RECENT RESULTS: Recent results are below, the most recent result is correlated with a dose of 40 mg. per week: Lab Results  Component Value Date   INR 3.50 04/07/2011   INR 3.6 04/01/2011   INR 2.60 03/25/2011    ANTI-COAG DOSE:   Latest dosing instructions   Total [name] Tue Wed Thu Fri Sat   35 5 mg 5 mg 5 mg 5 mg 5 mg 5 mg 5 mg    (10 mg0.5) (10 mg0.5) (10 mg0.5) (10 mg0.5) (10 mg0.5) (10 mg0.5) (10 mg0.5)         ANTICOAG SUMMARY: Anticoagulation Episode Summary              Current INR goal 2.0-3.0 Next INR check 04/28/2011   INR from last check 3.50! (04/07/2011)     Weekly max dose (mg)  Target end date    Indications    INR check location [facility] Preferred lab    Send INR reminders to    Comments             ANTICOAG TODAY: Anticoagulation Summary as of 04/07/2011              INR goal 2.0-3.0     Selected INR 3.50! (04/07/2011) Next INR check 04/28/2011   Weekly max dose (mg)  Target end date    Indications     Anticoagulation Episode Summary              INR check location [facility] Preferred lab    Send INR reminders to    Comments             PATIENT INSTRUCTIONS: Patient Instructions  Patient instructed to take medications as defined in the Anti-coagulation Track section of this encounter.  Patient instructed to take today's dose.  Patient verbalized understanding of these instructions.        FOLLOW-UP Return in 3 weeks (on 04/28/2011) for Follow up INR.  Hulen Luster, III Pharm.D., CACP

## 2011-04-11 DIAGNOSIS — R9430 Abnormal result of cardiovascular function study, unspecified: Secondary | ICD-10-CM | POA: Insufficient documentation

## 2011-04-14 ENCOUNTER — Ambulatory Visit (INDEPENDENT_AMBULATORY_CARE_PROVIDER_SITE_OTHER): Payer: No Typology Code available for payment source | Admitting: Cardiology

## 2011-04-14 ENCOUNTER — Encounter: Payer: Self-pay | Admitting: Cardiology

## 2011-04-14 DIAGNOSIS — R911 Solitary pulmonary nodule: Secondary | ICD-10-CM

## 2011-04-14 DIAGNOSIS — Z7901 Long term (current) use of anticoagulants: Secondary | ICD-10-CM

## 2011-04-14 DIAGNOSIS — R0602 Shortness of breath: Secondary | ICD-10-CM

## 2011-04-14 DIAGNOSIS — J984 Other disorders of lung: Secondary | ICD-10-CM

## 2011-04-14 NOTE — Assessment & Plan Note (Signed)
The patient had a small lung nodule noted in 2011.  Repeat CT scan in June of 2012 reveals that this has not changed.  It is felt to be benign.  No further workup is needed.

## 2011-04-14 NOTE — Assessment & Plan Note (Signed)
The patient will need to remain on Coumadin indefinitely.

## 2011-04-14 NOTE — Assessment & Plan Note (Signed)
There is no cardiac abnormality causing shortness of breath.  No change in therapy.

## 2011-04-14 NOTE — Patient Instructions (Signed)
Your physician recommends that you schedule a follow-up appointment in: 12 months, the office will mail you a reminder letter 2 months prior appointment date.

## 2011-04-14 NOTE — Progress Notes (Signed)
HPI Patient is seen today to followup her shortness of breath.  I had seen her in the office in April, 2012.  Recently she had arthroscopic work done on her left knee.  After that she developed deep venous thrombosis according to her history.  There is a history of pulmonary emboli in the past.  She continues on Coumadin.  She is stable. Allergies  Allergen Reactions  . Aspirin     REACTION: Nausea (325mg )    Current Outpatient Prescriptions  Medication Sig Dispense Refill  . amLODipine (NORVASC) 5 MG tablet Take 1 tablet (5 mg total) by mouth daily.  30 tablet  6  . lisinopril-hydrochlorothiazide (PRINZIDE,ZESTORETIC) 20-12.5 MG per tablet Take 2 tablets by mouth every morning.        . warfarin (COUMADIN) 10 MG tablet Take as directed by anticoagulation clinic provider.  30 tablet  3    History   Social History  . Marital Status: Single    Spouse Name: N/A    Number of Children: N/A  . Years of Education: N/A   Occupational History  . Not on file.   Social History Main Topics  . Smoking status: Former Games developer  . Smokeless tobacco: Not on file  . Alcohol Use: 3.0 oz/week    6 drink(s) per week     6 pack of beer  . Drug Use: Yes    Special: Marijuana     smoked joint 2 weeks ago  . Sexually Active: Not on file   Other Topics Concern  . Not on file   Social History Narrative   Financial assistance approved for 100% discount at [facility] and has [facility] card per [place]6/01/2010    Family History  Problem Relation Age of Onset  . Diabetes Father   . Diabetes Sister   . Diabetes Mother   . Deep vein thrombosis Sister   . Ovarian cancer Mother   . Bone cancer Maternal Grandmother     Past Medical History  Diagnosis Date  . Shoulder pain   . Pulmonary embolus 01/16/10    on Coumadin  . OSA (obstructive sleep apnea)   . Abdominal hernia   . Hypertension   . Overweight   . Lung nodule 12/2009    Very small, left upper lobe by CT June, 2011, one-year followup was  stable  . Shortness of breath   . Warfarin anticoagulation   . Ejection fraction     65-70%, vigorous function, echo, March, 2011    Past Surgical History  Procedure Date  . Abdominal hysterectomy 07/2002    laparoscopic assisted vaginal hysterectomy for menorrhagia, dysmenorrhea, anemia, fibroids    ROS  Patient denies fever, chills, headache, sweats, rash, change in vision, change in hearing, chest pain, cough, nausea vomiting, urinary symptoms.  All other systems are reviewed and are negative.  PHYSICAL EXAM Patient is overweight but stable.  Head is atraumatic.  There is no jugular venous distention.  Lungs are clear.  Respiratory effort is unlabored.  Cardiac exam reveals S1 and S2.  No clicks or significant murmurs.  The abdomen is soft.  No peripheral edema. Filed Vitals:   04/14/11 1501  BP: 132/84  Pulse: 76  Height: 5\' 7"  (1.702 m)  Weight: 283 lb 1.9 oz (128.422 kg)    EKG is done today and reviewed by me.  There is no significant abnormality. ASSESSMENT & PLAN

## 2011-04-28 ENCOUNTER — Ambulatory Visit (INDEPENDENT_AMBULATORY_CARE_PROVIDER_SITE_OTHER): Payer: No Typology Code available for payment source | Admitting: Pharmacist

## 2011-04-28 DIAGNOSIS — Z7901 Long term (current) use of anticoagulants: Secondary | ICD-10-CM

## 2011-04-28 DIAGNOSIS — I2699 Other pulmonary embolism without acute cor pulmonale: Secondary | ICD-10-CM

## 2011-04-28 LAB — POCT INR: INR: 2

## 2011-04-28 NOTE — Progress Notes (Signed)
Anti-Coagulation Progress Note  Breanna Wilkins is a 49 y.o. female who is currently on an anti-coagulation regimen.    RECENT RESULTS: Recent results are below, the most recent result is correlated with a dose of 35 mg. per week: Lab Results  Component Value Date   INR 2.0 04/28/2011   INR 3.50 04/07/2011   INR 3.6 04/01/2011    ANTI-COAG DOSE:   Latest dosing instructions   Total [name] Tue Wed Thu Fri Sat   45 5 mg 10 mg 5 mg 5 mg 10 mg 5 mg 5 mg    (10 mg0.5) (10 mg1) (10 mg0.5) (10 mg0.5) (10 mg1) (10 mg0.5) (10 mg0.5)         ANTICOAG SUMMARY: Anticoagulation Episode Summary              Current INR goal 2.0-3.0 Next INR check 05/19/2011   INR from last check 2.0 (04/28/2011)     Weekly max dose (mg)  Target end date    Indications    INR check location [facility] Preferred lab    Send INR reminders to    Comments             ANTICOAG TODAY: Anticoagulation Summary as of 04/28/2011              INR goal 2.0-3.0     Selected INR 2.0 (04/28/2011) Next INR check 05/19/2011   Weekly max dose (mg)  Target end date    Indications     Anticoagulation Episode Summary              INR check location [facility] Preferred lab    Send INR reminders to    Comments             PATIENT INSTRUCTIONS: Patient Instructions  Patient instructed to take medications as defined in the Anti-coagulation Track section of this encounter.  Patient instructed to take today's dose.  Patient verbalized understanding of these instructions.        FOLLOW-UP Return in 3 weeks (on 05/19/2011) for Follow up INR.  Hulen Luster, III Pharm.D., CACP

## 2011-04-28 NOTE — Patient Instructions (Signed)
Patient instructed to take medications as defined in the Anti-coagulation Track section of this encounter.  Patient instructed to take today's dose.  Patient verbalized understanding of these instructions.    

## 2011-05-06 LAB — DIFFERENTIAL
Basophils Absolute: 0.1
Eosinophils Absolute: 0.1
Eosinophils Relative: 1
Lymphocytes Relative: 16
Monocytes Absolute: 0.4

## 2011-05-06 LAB — POCT CARDIAC MARKERS
CKMB, poc: <1 — ABNORMAL LOW
Troponin i, poc: <0.05
Troponin i, poc: <0.05

## 2011-05-06 LAB — COMPREHENSIVE METABOLIC PANEL
ALT: 11
AST: 15
Albumin: 3.5
Alkaline Phosphatase: 62
Chloride: 106
GFR calc Af Amer: >60
Potassium: 3.2 — ABNORMAL LOW
Sodium: 139
Total Bilirubin: 0.7

## 2011-05-06 LAB — CBC
Platelets: 291
RBC: 4.99
WBC: 5.1

## 2011-05-19 ENCOUNTER — Ambulatory Visit (INDEPENDENT_AMBULATORY_CARE_PROVIDER_SITE_OTHER): Payer: No Typology Code available for payment source | Admitting: Pharmacist

## 2011-05-19 DIAGNOSIS — Z7901 Long term (current) use of anticoagulants: Secondary | ICD-10-CM

## 2011-05-19 DIAGNOSIS — I2699 Other pulmonary embolism without acute cor pulmonale: Secondary | ICD-10-CM

## 2011-05-19 NOTE — Progress Notes (Signed)
Anti-Coagulation Progress Note  Breanna Wilkins is a 50 y.o. female who is currently on an anti-coagulation regimen.    RECENT RESULTS: Recent results are below, the most recent result is correlated with a dose of 45 mg. per week: Lab Results  Component Value Date   INR 2.4 05/19/2011   INR 2.0 04/28/2011   INR 3.50 04/07/2011    ANTI-COAG DOSE:   Latest dosing instructions   Total [name] Tue Wed Thu Fri Sat   50 5 mg 10 mg 5 mg 10 mg 5 mg 10 mg 5 mg    (10 mg0.5) (10 mg1) (10 mg0.5) (10 mg1) (10 mg0.5) (10 mg1) (10 mg0.5)         ANTICOAG SUMMARY: Anticoagulation Episode Summary              Current INR goal 2.0-3.0 Next INR check 06/09/2011   INR from last check 2.4 (05/19/2011)     Weekly max dose (mg)  Target end date    Indications    INR check location [facility]    Send INR reminders to    Comments             ANTICOAG TODAY: Anticoagulation Summary as of 05/19/2011              INR goal 2.0-3.0     Selected INR 2.4 (05/19/2011) Next INR check 06/09/2011   Weekly max dose (mg)  Target end date    Indications     Anticoagulation Episode Summary              INR check location [facility]erred lab    Send INR reminders to    Comments             PATIENT INSTRUCTIONS: Patient Instructions  Patient instructed to take medications as defined in the Anti-coagulation Track section of this encounter.  Patient instructed to take today's dose.  Patient verbalized understanding of these instructions.        FOLLOW-UP Return in 3 weeks (on 06/09/2011) for Follow up INR.  Hulen Luster, III Pharm.D., CACP

## 2011-05-19 NOTE — Patient Instructions (Signed)
Patient instructed to take medications as defined in the Anti-coagulation Track section of this encounter.  Patient instructed to take today's dose.  Patient verbalized understanding of these instructions.    

## 2011-06-09 ENCOUNTER — Ambulatory Visit (INDEPENDENT_AMBULATORY_CARE_PROVIDER_SITE_OTHER): Payer: No Typology Code available for payment source | Admitting: Pharmacist

## 2011-06-09 DIAGNOSIS — Z7901 Long term (current) use of anticoagulants: Secondary | ICD-10-CM

## 2011-06-09 DIAGNOSIS — I2699 Other pulmonary embolism without acute cor pulmonale: Secondary | ICD-10-CM

## 2011-06-09 NOTE — Patient Instructions (Signed)
Patient instructed to take medications as defined in the Anti-coagulation Track section of this encounter.  Patient instructed to take today's dose.  Patient verbalized understanding of these instructions.    

## 2011-06-09 NOTE — Progress Notes (Signed)
Anti-Coagulation Progress Note  Breanna Wilkins is a 50 y.o. female who is currently on an anti-coagulation regimen.    RECENT RESULTS: Recent results are below, the most recent result is correlated with a dose of 50 mg. per week: Lab Results  Component Value Date   INR 3.60 06/09/2011   INR 2.4 05/19/2011   INR 2.0 04/28/2011    ANTI-COAG DOSE:   Latest dosing instructions   Total [name] Tue Wed Thu Fri Sat   45 5 mg 10 mg 5 mg 5 mg 10 mg 5 mg 5 mg    (10 mg0.5) (10 mg1) (10 mg0.5) (10 mg0.5) (10 mg1) (10 mg0.5) (10 mg0.5)         ANTICOAG SUMMARY: Anticoagulation Episode Summary              Current INR goal 2.0-3.0 Next INR check 07/07/2011   INR from last check 3.60! (06/09/2011)     Weekly max dose (mg)  Target end date    Indications    [facility] check location [facility]    Send INR reminders to    Comments             ANTICOAG TODAY: Anticoagulation Summary as of 06/09/2011              INR goal 2.0-3.0     Selected INR 3.60! (06/09/2011) Next INR check 07/07/2011   Weekly max dose (mg)  Target end date    Indications     Anticoagulation Episode Summary              INR check location [facility]    Send INR reminders to    Comments             PATIENT INSTRUCTIONS: Patient Instructions  Patient instructed to take medications as defined in the Anti-coagulation Track section of this encounter.  Patient instructed to take today's dose.  Patient verbalized understanding of these instructions.        FOLLOW-UP Return for Follow up INR.  Hulen Luster, III Pharm.D., CACP

## 2011-06-10 ENCOUNTER — Encounter: Payer: Self-pay | Admitting: Internal Medicine

## 2011-06-10 ENCOUNTER — Ambulatory Visit (INDEPENDENT_AMBULATORY_CARE_PROVIDER_SITE_OTHER): Payer: No Typology Code available for payment source | Admitting: Internal Medicine

## 2011-06-10 VITALS — BP 139/83 | HR 79 | Temp 97.1°F | Ht 67.0 in | Wt 291.1 lb

## 2011-06-10 DIAGNOSIS — E785 Hyperlipidemia, unspecified: Secondary | ICD-10-CM

## 2011-06-10 DIAGNOSIS — Z299 Encounter for prophylactic measures, unspecified: Secondary | ICD-10-CM

## 2011-06-10 DIAGNOSIS — I824Y9 Acute embolism and thrombosis of unspecified deep veins of unspecified proximal lower extremity: Secondary | ICD-10-CM

## 2011-06-10 DIAGNOSIS — I10 Essential (primary) hypertension: Secondary | ICD-10-CM

## 2011-06-10 DIAGNOSIS — I82439 Acute embolism and thrombosis of unspecified popliteal vein: Secondary | ICD-10-CM

## 2011-06-10 NOTE — Progress Notes (Signed)
History of present illness: Breanna Wilkins is a 50 year old woman with past medical history of OSA, hypertension, rotator cuff syndrome of the right shoulder, plantar fasciitis, history of PE and DVT currently on Coumadin presents today for followup. Patient is doing well without any specific complaints. She reports that she has not been taking her blood pressure medication in the past 3 days. Of note, patient's mother just passed away 5 days ago and she states that she is coping well and does not need any medication at this time.  As far as her knee pain concerned, she states that the pain is much improved after knee surgery. She denies any shortness of breath, chest pain, fevers, chills, N/V.    ROS: as per HPI  PE: General: alert, well-developed, and cooperative to examination.   Lungs: normal respiratory effort, no accessory muscle use, normal breath sounds, no crackles, and no wheezes. Heart: normal rate, regular rhythm, no murmur, no gallop, and no rub.  Abdomen: soft, non-tender, normal bowel sounds, no distention, no guarding, no rebound tenderness, no hepatomegaly, and no splenomegaly.  Pulses: 2+ DP/PT pulses bilaterally Extremities: No cyanosis, clubbing, +1 pitting edema on LE bilaterally Neurologic: alert & oriented X3, cranial nerves II-XII intact, strength normal in all extremities, sensation intact to light touch, and gait normal.  Skin: turgor normal and no rashes.  Psych: Oriented X3, memory intact for recent and remote, normally interactive, good eye contact, not anxious appearing, and slightly depressed appearing.

## 2011-06-10 NOTE — Assessment & Plan Note (Signed)
Adequately controlled given that she has not taken her meds in past 3 days.  No change in regimen at this time. -Will continue Norvasc 5mg  and Lisinopril-hctz 20/12.5mg  daily

## 2011-06-10 NOTE — Assessment & Plan Note (Signed)
Stable. Asymptomatic at this time. Will continue Coumadin therapy indefinitely.

## 2011-06-10 NOTE — Assessment & Plan Note (Addendum)
-  Patient had a lipid panel in March 2007 with an LDL of 144 and that she does not have any cardiovascular disease or diabetes. She is currently not on any medication at this time.  Will recheck fasting lipid panel in the morning. I encouraged patient to exercise at least 3 times per week and avoid eating greasy food and increase vegetables intake. -Flu vaccination was offered to patient however patient refused. -last Mammogram 08/2010 -Colonoscopy: 10/2009 -Will schedule for Pap Smear

## 2011-06-10 NOTE — Patient Instructions (Signed)
Continue current blood pressure medications Return in the morning for fasting lipid panel Will schedule for Pap smear at next office visit Followup with Dr. Anselm Jungling in 6 months or sooner if necessary.

## 2011-06-11 ENCOUNTER — Other Ambulatory Visit: Payer: No Typology Code available for payment source

## 2011-06-12 LAB — LIPID PANEL
Cholesterol: 221 mg/dL — ABNORMAL HIGH (ref 0–200)
LDL Cholesterol: 148 mg/dL — ABNORMAL HIGH (ref 0–99)
VLDL: 25 mg/dL (ref 0–40)

## 2011-06-23 ENCOUNTER — Ambulatory Visit: Payer: No Typology Code available for payment source | Admitting: Family Medicine

## 2011-07-07 ENCOUNTER — Ambulatory Visit (INDEPENDENT_AMBULATORY_CARE_PROVIDER_SITE_OTHER): Payer: No Typology Code available for payment source | Admitting: Pharmacist

## 2011-07-07 DIAGNOSIS — I2699 Other pulmonary embolism without acute cor pulmonale: Secondary | ICD-10-CM

## 2011-07-07 DIAGNOSIS — Z7901 Long term (current) use of anticoagulants: Secondary | ICD-10-CM

## 2011-07-07 MED ORDER — WARFARIN SODIUM 10 MG PO TABS: ORAL_TABLET | ORAL | Status: DC

## 2011-07-07 NOTE — Patient Instructions (Signed)
Patient instructed to take medications as defined in the Anti-coagulation Track section of this encounter.  Patient instructed to take today's dose.  Patient verbalized understanding of these instructions.    

## 2011-07-07 NOTE — Progress Notes (Signed)
Anti-Coagulation Progress Note  Breanna Wilkins is a 50 y.o. female who is currently on an anti-coagulation regimen.    RECENT RESULTS: Recent results are below, the most recent result is correlated with a dose of 45 mg. per week: Lab Results  Component Value Date   INR 1.20 07/07/2011   INR 3.60 06/09/2011   INR 2.4 05/19/2011    ANTI-COAG DOSE:   Latest dosing instructions   Total [name] Tue Wed Thu Fri Sat   50 5 mg 10 mg 10 mg 10 mg 5 mg 5 mg 5 mg    (10 mg0.5) (10 mg1) (10 mg1) (10 mg1) (10 mg0.5) (10 mg0.5) (10 mg0.5)         ANTICOAG SUMMARY: Anticoagulation Episode Summary              Current INR goal 2.0-3.0 Next INR check 07/28/2011   INR from last check 1.20! (07/07/2011)     Weekly max dose (mg)  Target end date    Indications    [facility] check location [facility]    Send INR reminders to    Comments             ANTICOAG TODAY: Anticoagulation Summary as of 07/07/2011              INR goal 2.0-3.0     Selected INR 1.20! (07/07/2011) Next INR check 07/28/2011   Weekly max dose (mg)  Target end date    Indications     Anticoagulation Episode Summary              INR check location [facility]    Send INR reminders to    Comments             PATIENT INSTRUCTIONS: Patient Instructions  Patient instructed to take medications as defined in the Anti-coagulation Track section of this encounter.  Patient instructed to take today's dose.  Patient verbalized understanding of these instructions.        FOLLOW-UP Return in 3 weeks (on 07/28/2011) for Follow up INR.  Hulen Luster, III Pharm.D., CACP

## 2011-07-28 ENCOUNTER — Ambulatory Visit (INDEPENDENT_AMBULATORY_CARE_PROVIDER_SITE_OTHER): Payer: No Typology Code available for payment source | Admitting: Pharmacist

## 2011-07-28 DIAGNOSIS — I2699 Other pulmonary embolism without acute cor pulmonale: Secondary | ICD-10-CM

## 2011-07-28 DIAGNOSIS — Z7901 Long term (current) use of anticoagulants: Secondary | ICD-10-CM

## 2011-07-28 LAB — POCT INR: INR: 2.7

## 2011-07-28 NOTE — Patient Instructions (Signed)
Patient instructed to take medications as defined in the Anti-coagulation Track section of this encounter.  Patient instructed to take today's dose.  Patient verbalized understanding of these instructions.    

## 2011-07-28 NOTE — Progress Notes (Signed)
Anti-Coagulation Progress Note  Breanna Wilkins is a 51 y.o. female who is currently on an anti-coagulation regimen.    RECENT RESULTS: Recent results are below, the most recent result is correlated with a dose of 50 mg. per week: Lab Results  Component Value Date   INR 2.70 07/28/2011   INR 1.20 07/07/2011   INR 3.60 06/09/2011    ANTI-COAG DOSE:   Latest dosing instructions   Total [name] Tue Wed Thu Fri Sat   50 5 mg 10 mg 5 mg 10 mg 5 mg 10 mg 5 mg    (10 mg0.5) (10 mg1) (10 mg0.5) (10 mg1) (10 mg0.5) (10 mg1) (10 mg0.5)         ANTICOAG SUMMARY: Anticoagulation Episode Summary              Current INR goal 2.0-3.0 Next INR check 08/25/2011   INR from last check 2.70 (07/28/2011)     Weekly max dose (mg)  Target end date    Indications    INR check location [facility]    Send INR reminders to    Comments             ANTICOAG TODAY: Anticoagulation Summary as of 07/28/2011              INR goal 2.0-3.0     Selected INR 2.70 (07/28/2011) Next INR check 08/25/2011   Weekly max dose (mg)  Target end date    Indications     Anticoagulation Episode Summary              INR check location [facility]    Send INR reminders to    Comments             PATIENT INSTRUCTIONS: Patient Instructions  Patient instructed to take medications as defined in the Anti-coagulation Track section of this encounter.  Patient instructed to take today's dose.  Patient verbalized understanding of these instructions.        FOLLOW-UP Return in 4 weeks (on 08/25/2011) for Follow up INR.  Hulen Luster, III Pharm.D., CACP

## 2011-07-29 ENCOUNTER — Ambulatory Visit (INDEPENDENT_AMBULATORY_CARE_PROVIDER_SITE_OTHER): Payer: No Typology Code available for payment source | Admitting: Internal Medicine

## 2011-07-29 ENCOUNTER — Encounter: Payer: Self-pay | Admitting: Internal Medicine

## 2011-07-29 DIAGNOSIS — M7662 Achilles tendinitis, left leg: Secondary | ICD-10-CM | POA: Insufficient documentation

## 2011-07-29 DIAGNOSIS — R000 Tachycardia, unspecified: Secondary | ICD-10-CM | POA: Insufficient documentation

## 2011-07-29 DIAGNOSIS — M7660 Achilles tendinitis, unspecified leg: Secondary | ICD-10-CM

## 2011-07-29 DIAGNOSIS — I10 Essential (primary) hypertension: Secondary | ICD-10-CM

## 2011-07-29 MED ORDER — LISINOPRIL-HYDROCHLOROTHIAZIDE 20-12.5 MG PO TABS: 2.0000 | ORAL_TABLET | ORAL | Status: DC

## 2011-07-29 MED ORDER — AMLODIPINE BESYLATE 5 MG PO TABS: 5.0000 mg | ORAL_TABLET | Freq: Every day | ORAL | Status: DC

## 2011-07-29 MED ORDER — MELOXICAM 15 MG PO TABS: 15.0000 mg | ORAL_TABLET | Freq: Every day | ORAL | Status: DC

## 2011-07-29 NOTE — Patient Instructions (Signed)
Continue icing your left ankle at least 20 minutes 3 times daily Start taking Mobic 15mg  one tablet daily x 2 weeks  Please call the office back if your pain does not improve Follow up with Dr. Anselm Jungling as needed

## 2011-07-29 NOTE — Progress Notes (Signed)
HPI: 51 yo woman with PMH of DVT, rotator cuff syndrome, hypertension, plantar fasciitis presents today for left ankle pain. She states that the pain has been going on in the past several days that is irritated with wearing tight shoes. She denies any redness, drainage, numbness or tingling, fevers or chills.  She has been trying icing for several minutes per day at home without much improvement.  She also states that her right shoulder is still hurting and that the steroid injection did not help relieve her pain. She is trying to lose weight by going to the gym and has stopped drinking alcohol. No other complaints. Denies any SOB, chestpain.  ROS: as per HPI  PE: General: alert, well-developed, and cooperative to examination.  Lungs: normal respiratory effort, no accessory muscle use, normal breath sounds, no crackles, and no wheezes. Heart: tachycardic, regular rhythm, no murmur, no gallop, and no rub.  Abdomen: obese. soft, non-tender, normal bowel sounds, no distention, no guarding  and no splenomegaly.  Msk: left ankle: tender on palpation over the achilles tendon but not edema, erythema, or increased in warmth Pulses: 2+ DP/PT pulses bilaterally Extremities: No cyanosis, clubbing, trace nonpitting edema Neurologic: alert & oriented X3, cranial nerves II-XII intact, strength normal in all extremities, sensation intact to light touch, and gait normal.  Skin: turgor normal and no rashes.  Psych: Oriented X3, memory intact for recent and remote, normally interactive, good eye contact, not anxious appearing, and not depressed appearing.

## 2011-07-29 NOTE — Assessment & Plan Note (Addendum)
Likely tendonitis vs. Bursitis.  -Will prescribe short course of NSAIDs- Mobic 15mg  po qd x 2 weeks.  I called and discussed with Dr. Alexandria Lodge in setting of her coumadin, he states that it should not interfere with her INR and that the bleeding risk is probably less if dosing once daily instead of multiple dosing such as ibuprofen.   -Advised patient to continue icing at least 20 minutes , 3 times daily  -We discussed the option of oral steroids vs. Injection; however, patient is not interested in steroids at this time because she is trying to loose weight.  If pain continue to persist, may need some steroids.  She was instructed to call me back if her pain does not improve.

## 2011-07-29 NOTE — Assessment & Plan Note (Signed)
Well controlled. Will continue current regimen. Refills sent to pharmacy

## 2011-07-29 NOTE — Assessment & Plan Note (Signed)
Likely sinus tachycardia.  Patient was rushing from parking lot to the clinic and we took her vitals immediately.  She is completely asymptomatic, denies any SOB or chest pain.  I was going to repeat her pulse at the end of office visit but patient left the clinic before we can repeat her pulse.  We called her cell phone but could not get in touch with her.

## 2011-08-25 ENCOUNTER — Ambulatory Visit (INDEPENDENT_AMBULATORY_CARE_PROVIDER_SITE_OTHER): Payer: No Typology Code available for payment source | Admitting: Pharmacist

## 2011-08-25 DIAGNOSIS — Z7901 Long term (current) use of anticoagulants: Secondary | ICD-10-CM

## 2011-08-25 DIAGNOSIS — I2699 Other pulmonary embolism without acute cor pulmonale: Secondary | ICD-10-CM

## 2011-08-25 LAB — POCT INR: INR: 2.2

## 2011-08-25 NOTE — Patient Instructions (Signed)
Patient instructed to take medications as defined in the Anti-coagulation Track section of this encounter.  Patient instructed to take today's dose.  Patient verbalized understanding of these instructions.    

## 2011-08-25 NOTE — Progress Notes (Signed)
Anti-Coagulation Progress Note  Breanna Wilkins is a 51 y.o. female who is currently on an anti-coagulation regimen.    RECENT RESULTS: Recent results are below, the most recent result is correlated with a dose of 50 mg. per week: Lab Results  Component Value Date   INR 2.20 08/25/2011   INR 2.70 07/28/2011   INR 1.20 07/07/2011    ANTI-COAG DOSE:   Latest dosing instructions   Total [name] Tue Wed Thu Fri Sat   50 5 mg 10 mg 5 mg 10 mg 5 mg 10 mg 5 mg    (10 mg0.5) (10 mg1) (10 mg0.5) (10 mg1) (10 mg0.5) (10 mg1) (10 mg0.5)         ANTICOAG SUMMARY: Anticoagulation Episode Summary              Current INR goal 2.0-3.0 Next INR check 09/29/2011   INR from last check 2.20 (08/25/2011)     Weekly max dose (mg)  Target end date    Indications    [facility]R check location [facility]    Send INR reminders to    Comments             ANTICOAG TODAY: Anticoagulation Summary as of 08/25/2011              INR goal 2.0-3.0     Selected INR 2.20 (08/25/2011) Next INR check 09/29/2011   Weekly max dose (mg)  Target end date    Indications     Anticoagulation Episode Summary              INR check location [facility]    Send INR reminders to    Comments             PATIENT INSTRUCTIONS: Patient Instructions  Patient instructed to take medications as defined in the Anti-coagulation Track section of this encounter.  Patient instructed to take today's dose.  Patient verbalized understanding of these instructions.        FOLLOW-UP Return in 5 weeks (on 09/29/2011) for Follow up INR.  Hulen Luster, III Pharm.D., CACP

## 2011-09-11 ENCOUNTER — Other Ambulatory Visit: Payer: Self-pay | Admitting: Obstetrics & Gynecology

## 2011-09-11 ENCOUNTER — Other Ambulatory Visit: Payer: Self-pay | Admitting: Internal Medicine

## 2011-09-11 DIAGNOSIS — Z1231 Encounter for screening mammogram for malignant neoplasm of breast: Secondary | ICD-10-CM

## 2011-09-29 ENCOUNTER — Ambulatory Visit (INDEPENDENT_AMBULATORY_CARE_PROVIDER_SITE_OTHER): Payer: Self-pay | Admitting: Pharmacist

## 2011-09-29 DIAGNOSIS — Z7901 Long term (current) use of anticoagulants: Secondary | ICD-10-CM

## 2011-09-29 DIAGNOSIS — I2699 Other pulmonary embolism without acute cor pulmonale: Secondary | ICD-10-CM

## 2011-09-29 NOTE — Patient Instructions (Signed)
Patient instructed to take medications as defined in the Anti-coagulation Track section of this encounter.  Patient instructed to OMIT/HOLD today's dose.  Patient verbalized understanding of these instructions.    

## 2011-09-29 NOTE — Progress Notes (Signed)
Anti-Coagulation Progress Note  Breanna Wilkins is a 50 y.o. female who is currently on an anti-coagulation regimen.    RECENT RESULTS: Recent results are below, the most recent result is correlated with a dose of 50 mg. per week: Will OMIT today's dose. Lab Results  Component Value Date   INR 5.50 09/29/2011   INR 2.20 08/25/2011   INR 2.70 07/28/2011    ANTI-COAG DOSE:   Latest dosing instructions   Total [name] Tue Wed Thu Fri Sat   45 5 mg 10 mg 5 mg 5 mg 5 mg 10 mg 5 mg    (10 mg0.5) (10 mg1) (10 mg0.5) (10 mg0.5) (10 mg0.5) (10 mg1) (10 mg0.5)         ANTICOAG SUMMARY: Anticoagulation Episode Summary              Current INR goal 2.0-3.0 Next INR check 10/06/2011   INR from last check 5.50! (09/29/2011)     Weekly max dose (mg)  Target end date    Indications    INR check location [facility]    Send INR reminders to    Comments             ANTICOAG TODAY: Anticoagulation Summary as of 09/29/2011              INR goal 2.0-3.0     Selected INR 5.50! (09/29/2011) Next INR check 10/06/2011   Weekly max dose (mg)  Target end date    Indications     Anticoagulation Episode Summary              INR check location [facility]    Send INR reminders to    Comments             PATIENT INSTRUCTIONS: Patient Instructions  Patient instructed to take medications as defined in the Anti-coagulation Track section of this encounter.  Patient instructed to OMIT/HOLD today's dose.  Patient verbalized understanding of these instructions.        FOLLOW-UP Return in 7 days (on 10/06/2011) for Follow up INR.  Hulen Luster, III Pharm.D., CACP

## 2011-09-29 NOTE — Progress Notes (Signed)
Agree with Dr. Groce's assessment and management plan. 

## 2011-10-06 ENCOUNTER — Ambulatory Visit (INDEPENDENT_AMBULATORY_CARE_PROVIDER_SITE_OTHER): Payer: Self-pay | Admitting: Pharmacist

## 2011-10-06 DIAGNOSIS — Z7901 Long term (current) use of anticoagulants: Secondary | ICD-10-CM

## 2011-10-06 DIAGNOSIS — I2699 Other pulmonary embolism without acute cor pulmonale: Secondary | ICD-10-CM

## 2011-10-06 NOTE — Progress Notes (Signed)
Anti-Coagulation Progress Note  Breanna Wilkins is a 51 y.o. female who is currently on an anti-coagulation regimen.    RECENT RESULTS: Recent results are below, the most recent result is correlated with a dose of 45 mg. per week: Lab Results  Component Value Date   INR 3.50 10/06/2011   INR 5.50 09/29/2011   INR 2.20 08/25/2011    ANTI-COAG DOSE:   Latest dosing instructions   Total [name] Tue Wed Thu Fri Sat   45 5 mg 10 mg 5 mg 5 mg 5 mg 10 mg 5 mg    (10 mg0.5) (10 mg1) (10 mg0.5) (10 mg0.5) (10 mg0.5) (10 mg1) (10 mg0.5)         ANTICOAG SUMMARY: Anticoagulation Episode Summary              Current INR goal 2.0-3.0 Next INR check 11/03/2011   INR from last check 3.50! (10/06/2011)     Weekly max dose (mg)  Target end date    Indications    INR check location [facility]    Send INR reminders to    Comments             ANTICOAG TODAY: Anticoagulation Summary as of 10/06/2011              INR goal 2.0-3.0     Selected INR 3.50! (10/06/2011) Next INR check 11/03/2011   Weekly max dose (mg)  Target end date    Indications     Anticoagulation Episode Summary              INR check location [facility]    Send INR reminders to    Comments             PATIENT INSTRUCTIONS: Patient Instructions  Patient instructed to take medications as defined in the Anti-coagulation Track section of this encounter.  Patient instructed to take today's dose.  Patient verbalized understanding of these instructions.        FOLLOW-UP Return in 4 weeks (on 11/03/2011) for Follow up INR.  Hulen Luster, III Pharm.D., CACP

## 2011-10-06 NOTE — Patient Instructions (Signed)
Patient instructed to take medications as defined in the Anti-coagulation Track section of this encounter.  Patient instructed to take today's dose.  Patient verbalized understanding of these instructions.    

## 2011-10-08 ENCOUNTER — Ambulatory Visit (HOSPITAL_COMMUNITY)
Admission: RE | Admit: 2011-10-08 | Discharge: 2011-10-08 | Disposition: A | Discharge status: Home / Self Care | Payer: Self-pay | Source: Ambulatory Visit | Attending: Family Medicine | Admitting: Family Medicine

## 2011-10-08 DIAGNOSIS — Z1231 Encounter for screening mammogram for malignant neoplasm of breast: Secondary | ICD-10-CM | POA: Insufficient documentation

## 2011-10-23 ENCOUNTER — Encounter (HOSPITAL_COMMUNITY): Payer: Self-pay | Admitting: *Deleted

## 2011-10-23 ENCOUNTER — Emergency Department (HOSPITAL_COMMUNITY): Payer: Medicaid Other

## 2011-10-23 ENCOUNTER — Emergency Department (HOSPITAL_COMMUNITY)
Admission: EM | Admit: 2011-10-23 | Discharge: 2011-10-23 | Disposition: A | Discharge status: Home / Self Care | Payer: Medicaid Other | Attending: Emergency Medicine | Admitting: Emergency Medicine

## 2011-10-23 DIAGNOSIS — Z86718 Personal history of other venous thrombosis and embolism: Secondary | ICD-10-CM | POA: Insufficient documentation

## 2011-10-23 DIAGNOSIS — R109 Unspecified abdominal pain: Secondary | ICD-10-CM | POA: Insufficient documentation

## 2011-10-23 DIAGNOSIS — I10 Essential (primary) hypertension: Secondary | ICD-10-CM | POA: Insufficient documentation

## 2011-10-23 DIAGNOSIS — G4733 Obstructive sleep apnea (adult) (pediatric): Secondary | ICD-10-CM | POA: Insufficient documentation

## 2011-10-23 DIAGNOSIS — Z79899 Other long term (current) drug therapy: Secondary | ICD-10-CM | POA: Insufficient documentation

## 2011-10-23 DIAGNOSIS — K5289 Other specified noninfective gastroenteritis and colitis: Secondary | ICD-10-CM | POA: Insufficient documentation

## 2011-10-23 DIAGNOSIS — K529 Noninfective gastroenteritis and colitis, unspecified: Secondary | ICD-10-CM

## 2011-10-23 DIAGNOSIS — R509 Fever, unspecified: Secondary | ICD-10-CM | POA: Insufficient documentation

## 2011-10-23 DIAGNOSIS — Z7901 Long term (current) use of anticoagulants: Secondary | ICD-10-CM | POA: Insufficient documentation

## 2011-10-23 LAB — URINALYSIS, ROUTINE W REFLEX MICROSCOPIC
Bilirubin Urine: NEGATIVE
Leukocytes, UA: NEGATIVE
Nitrite: NEGATIVE
Specific Gravity, Urine: 1.021 (ref 1.005–1.030)
pH: 5 (ref 5.0–8.0)

## 2011-10-23 LAB — DIFFERENTIAL
Basophils Relative: 0 % (ref 0–1)
Eosinophils Absolute: 0.1 10*3/uL (ref 0.0–0.7)
Monocytes Relative: 7 % (ref 3–12)
Neutrophils Relative %: 76 % (ref 43–77)

## 2011-10-23 LAB — COMPREHENSIVE METABOLIC PANEL
Albumin: 3.3 g/dL — ABNORMAL LOW (ref 3.5–5.2)
Alkaline Phosphatase: 73 U/L (ref 39–117)
BUN: 7 mg/dL (ref 6–23)
Potassium: 3.3 mEq/L — ABNORMAL LOW (ref 3.5–5.1)
Sodium: 137 mEq/L (ref 135–145)
Total Protein: 7.3 g/dL (ref 6.0–8.3)

## 2011-10-23 LAB — LIPASE, BLOOD: Lipase: 18 U/L (ref 11–59)

## 2011-10-23 LAB — CBC
MCH: 26.7 pg (ref 26.0–34.0)
MCHC: 32.3 g/dL (ref 30.0–36.0)
Platelets: 291 10*3/uL (ref 150–400)
RDW: 15.9 % — ABNORMAL HIGH (ref 11.5–15.5)

## 2011-10-23 LAB — PROTIME-INR
INR: 2.42 — ABNORMAL HIGH (ref 0.00–1.49)
Prothrombin Time: 26.7 seconds — ABNORMAL HIGH (ref 11.6–15.2)

## 2011-10-23 LAB — URINE MICROSCOPIC-ADD ON

## 2011-10-23 MED ORDER — OXYCODONE-ACETAMINOPHEN 5-325 MG PO TABS: 1.0000 | ORAL_TABLET | ORAL | Status: AC | PRN

## 2011-10-23 MED ORDER — OXYCODONE-ACETAMINOPHEN 5-325 MG PO TABS
1.0000 | ORAL_TABLET | Freq: Once | ORAL | Status: AC
  Administered 2011-10-23: 1 via ORAL
  Filled 2011-10-23: qty 1

## 2011-10-23 MED ORDER — ONDANSETRON HCL 4 MG/2ML IJ SOLN
4.0000 mg | Freq: Once | INTRAMUSCULAR | Status: AC
  Administered 2011-10-23: 4 mg via INTRAVENOUS
  Filled 2011-10-23: qty 2

## 2011-10-23 MED ORDER — IOHEXOL 300 MG/ML  SOLN
20.0000 mL | INTRAMUSCULAR | Status: AC
  Administered 2011-10-23: 20 mL via ORAL

## 2011-10-23 MED ORDER — ONDANSETRON 4 MG PO TBDP: 4.0000 mg | ORAL_TABLET | Freq: Three times a day (TID) | ORAL | Status: AC | PRN

## 2011-10-23 MED ORDER — SODIUM CHLORIDE 0.9 % IV BOLUS (SEPSIS)
1000.0000 mL | Freq: Once | INTRAVENOUS | Status: AC
  Administered 2011-10-23: 1000 mL via INTRAVENOUS

## 2011-10-23 MED ORDER — IOHEXOL 300 MG/ML  SOLN
100.0000 mL | Freq: Once | INTRAMUSCULAR | Status: AC | PRN
  Administered 2011-10-23: 100 mL via INTRAVENOUS

## 2011-10-23 MED ORDER — HYDROMORPHONE HCL PF 1 MG/ML IJ SOLN
1.0000 mg | Freq: Once | INTRAMUSCULAR | Status: AC
  Administered 2011-10-23: 1 mg via INTRAVENOUS
  Filled 2011-10-23: qty 1

## 2011-10-23 NOTE — ED Notes (Signed)
Patient transported to CT 

## 2011-10-23 NOTE — ED Notes (Signed)
Dr. Kohut at bedside 

## 2011-10-23 NOTE — ED Provider Notes (Signed)
History     CSN: [id number]  Arrival date & time 10/23/11  0619   First MD Initiated Contact with Patient 10/23/11 [phone]      Chief Complaint  Patient presents with  . Abdominal Pain    (Consider location/radiation/quality/duration/timing/severity/associated sxs/prior treatment) HPI Comments: Patient with known umbilical hernia presents with a two day history of upper abdominal pain - she reports that the pain started gradually and has progressed to this point.  She reports that the pain is in RUQ and epigastric area, non-radiating, and progressively worse - she reports subjective fever with the pain, but denies nausea, vomiting, diarrhea.  Reports abdominal surgeries include abdominal hysterectomy but none other.  Denies lower abdominal pain, vaginal discharge or bleeding, dysuria, hematuria.  Patient is a 51 y.o. female presenting with abdominal pain. The history is provided by the patient. No language interpreter was used.  Abdominal Pain The primary symptoms of the illness include abdominal pain and fever. The primary symptoms of the illness do not include fatigue, shortness of breath, nausea, vomiting, diarrhea, hematemesis, hematochezia, dysuria, vaginal discharge or vaginal bleeding. The current episode started 2 days ago. The onset of the illness was gradual. The problem has been rapidly worsening.  The patient states that she believes she is currently not pregnant. The patient has not had a change in bowel habit. Symptoms associated with the illness do not include chills, anorexia, diaphoresis, heartburn, constipation, urgency, hematuria, frequency or back pain.    Past Medical History  Diagnosis Date  . Shoulder pain   . Pulmonary embolus 01/16/10    on Coumadin  . OSA (obstructive sleep apnea)   . Abdominal hernia   . Hypertension   . Overweight   . Lung nodule 12/2009    Very small, left upper lobe by CT June, 2011, one-year followup was stable  . Shortness of breath   .  Warfarin anticoagulation   . Ejection fraction     65-70%, vigorous function, echo, March, 2011    Past Surgical History  Procedure Date  . Abdominal hysterectomy 07/2002    laparoscopic assisted vaginal hysterectomy for menorrhagia, dysmenorrhea, anemia, fibroids    Family History  Problem Relation Age of Onset  . Diabetes Father   . Diabetes Sister   . Diabetes Mother   . Deep vein thrombosis Sister   . Ovarian cancer Mother   . Bone cancer Maternal Grandmother     History  Substance Use Topics  . Smoking status: Former Games developer  . Smokeless tobacco: Not on file  . Alcohol Use: 3.0 oz/week    6 drink(s) per week     6 pack of beer    OB History    Grav Para Term Preterm Abortions TAB SAB Ect Mult Living                  Review of Systems  Constitutional: Positive for fever. Negative for chills, diaphoresis and fatigue.  Respiratory: Negative for shortness of breath.   Gastrointestinal: Positive for abdominal pain. Negative for heartburn, nausea, vomiting, diarrhea, constipation, hematochezia, anorexia and hematemesis.  Genitourinary: Negative for dysuria, urgency, frequency, hematuria, vaginal bleeding and vaginal discharge.  Musculoskeletal: Negative for back pain.  All other systems reviewed and are negative.    Allergies  Aspirin  Home Medications   Current Outpatient Rx  Name Route Sig Dispense Refill  . AMLODIPINE BESYLATE 5 MG PO TABS Oral Take 1 tablet (5 mg total) by mouth daily. 30 tablet 6  . LISINOPRIL-HYDROCHLOROTHIAZIDE  20-12.5 MG PO TABS Oral Take 2 tablets by mouth every morning. 60 tablet 6  . WARFARIN SODIUM 10 MG PO TABS Oral Take 5-10 mg by mouth daily. 10 mg on Monday and Friday, 5 mg all other days      BP 165/84  Pulse 106  Temp(Src) 98.2 F (36.8 C) (Oral)  Resp 20  SpO2 97%  Physical Exam  Nursing note and vitals reviewed. Constitutional: She is oriented to person, place, and time. She appears well-developed and well-nourished.  She appears distressed.       Morbidly obese  HENT:  Head: Normocephalic and atraumatic.  Right Ear: External ear normal.  Left Ear: External ear normal.  Nose: Nose normal.  Mouth/Throat: Oropharynx is clear and moist. No oropharyngeal exudate.  Eyes: Conjunctivae are normal. Pupils are equal, round, and reactive to light. No scleral icterus.  Neck: Normal range of motion. Neck supple.  Cardiovascular: Normal rate, regular rhythm and normal heart sounds.  Exam reveals no gallop and no friction rub.   No murmur heard. Pulmonary/Chest: Effort normal and breath sounds normal. No respiratory distress. She has no wheezes. She has no rales. She exhibits no tenderness.  Abdominal: Soft. Bowel sounds are normal. She exhibits no distension and no mass. There is tenderness in the right upper quadrant and epigastric area. There is guarding. There is no rigidity and no rebound.    Musculoskeletal: Normal range of motion. She exhibits no edema and no tenderness.  Lymphadenopathy:    She has no cervical adenopathy.  Neurological: She is alert and oriented to person, place, and time. No cranial nerve deficit.  Skin: Skin is warm and dry. No rash noted. No erythema. No pallor.  Psychiatric: She has a normal mood and affect. Her behavior is normal. Judgment and thought content normal.    ED Course  Procedures (including critical care time)   Labs Reviewed  CBC  DIFFERENTIAL  COMPREHENSIVE METABOLIC PANEL  LIPASE, BLOOD  URINALYSIS, ROUTINE W REFLEX MICROSCOPIC   No results found.  Results for orders placed during the hospital encounter of 10/23/11  CBC      Component Value Range   WBC 7.9  4.0 - 10.5 (K/uL)   RBC 5.05  3.87 - 5.11 (MIL/uL)   Hemoglobin 13.5  12.0 - 15.0 (g/dL)   HCT 16.1  09.6 - 04.5 (%)   MCV 82.8  78.0 - 100.0 (fL)   MCH 26.7  26.0 - 34.0 (pg)   MCHC 32.3  30.0 - 36.0 (g/dL)   RDW 40.9 (*) 81.1 - 15.5 (%)   Platelets 291  150 - 400 (K/uL)  DIFFERENTIAL       Component Value Range   Neutrophils Relative 76  43 - 77 (%)   Neutro Abs 6.0  1.7 - 7.7 (K/uL)   Lymphocytes Relative 17  12 - 46 (%)   Lymphs Abs 1.3  0.7 - 4.0 (K/uL)   Monocytes Relative 7  3 - 12 (%)   Monocytes Absolute 0.5  0.1 - 1.0 (K/uL)   Eosinophils Relative 1  0 - 5 (%)   Eosinophils Absolute 0.1  0.0 - 0.7 (K/uL)   Basophils Relative 0  0 - 1 (%)   Basophils Absolute 0.0  0.0 - 0.1 (K/uL)  COMPREHENSIVE METABOLIC PANEL      Component Value Range   Sodium 137  135 - 145 (mEq/L)   Potassium 3.3 (*) 3.5 - 5.1 (mEq/L)   Chloride 103  96 - 112 (mEq/L)  CO2 24  19 - 32 (mEq/L)   Glucose, Bld 124 (*) 70 - 99 (mg/dL)   BUN 7  6 - 23 (mg/dL)   Creatinine, Ser 7.82  0.50 - 1.10 (mg/dL)   Calcium 8.8  8.4 - 95.6 (mg/dL)   Total Protein 7.3  6.0 - 8.3 (g/dL)   Albumin 3.3 (*) 3.5 - 5.2 (g/dL)   AST 16  0 - 37 (U/L)   ALT 13  0 - 35 (U/L)   Alkaline Phosphatase 73  39 - 117 (U/L)   Total Bilirubin 0.4  0.3 - 1.2 (mg/dL)   GFR calc non Af Amer >90  >90 (mL/min)   GFR calc Af Amer >90  >90 (mL/min)  LIPASE, BLOOD      Component Value Range   Lipase 18  11 - 59 (U/L)  URINALYSIS, ROUTINE W REFLEX MICROSCOPIC      Component Value Range   Color, Urine AMBER (*) YELLOW    APPearance CLOUDY (*) CLEAR    Specific Gravity, Urine 1.021  1.005 - 1.030    pH 5.0  5.0 - 8.0    Glucose, UA NEGATIVE  NEGATIVE (mg/dL)   Hgb urine dipstick MODERATE (*) NEGATIVE    Bilirubin Urine NEGATIVE  NEGATIVE    Ketones, ur NEGATIVE  NEGATIVE (mg/dL)   Protein, ur NEGATIVE  NEGATIVE (mg/dL)   Urobilinogen, UA 1.0  0.0 - 1.0 (mg/dL)   Nitrite NEGATIVE  NEGATIVE    Leukocytes, UA NEGATIVE  NEGATIVE   PROTIME-INR      Component Value Range   Prothrombin Time 26.7 (*) 11.6 - 15.2 (seconds)   INR 2.42 (*) 0.00 - 1.49   URINE MICROSCOPIC-ADD ON      Component Value Range   Squamous Epithelial / LPF FEW (*) RARE    WBC, UA 7-10  <3 (WBC/hpf)   RBC / HPF 3-6  <3 (RBC/hpf)   Bacteria, UA FEW (*)  RARE    Urine-Other MUCOUS PRESENT     Ct Abdomen Pelvis W Contrast  10/23/2011  *RADIOLOGY REPORT*  Clinical Data: Abdominal pain, right upper quadrant, nausea, vomiting  CT ABDOMEN AND PELVIS WITH CONTRAST  Technique:  Multidetector CT imaging of the abdomen and pelvis was performed following the standard protocol during bolus administration of intravenous contrast.  Contrast:  100 ml Omnipaque-300  Comparison: 03/17/2011 CTA chest  Findings: Normal heart size.  Small pericardial effusion evident. No pleural effusion or hiatal hernia.  Lower thoracic degenerative changes noted.  Minimal dependent basilar atelectasis present.  Abdomen:  Liver, distended gallbladder, biliary system, pancreas, spleen, adrenal glands, and kidneys are within normal limits for age demonstrate no acute finding.  Incidental 12 mm posterior left renal cortical cyst, image 30.  No renal obstruction or hydronephrosis.  Small amount of upper abdominal dependent free fluid around the spleen and the right inferior liver margin.  Fat containing umbilical hernia noted anteriorly measuring 4.1 cm in diameter.  Small amount of entrapped fluid within the umbilical hernia also.  Within the abdomen at this level, deep to the hernia, there is wall thickening / edema of the small bowel involving the jejunum.  Small amount of surrounding mesenteric edema/inflammation.  Appearances compatible with nonspecific segmental enteritis.  No evidence of pneumatosis, perforation, or abscess.  Small amount of surrounding free fluid.  Negative for bowel obstruction or ileus.  Colon is collapsed / decompressed.  Pelvis:  Small amount of pelvic free fluid as well.  Previous hysterectomy noted.  Urinary bladder and  adnexa unremarkable.  Very minor diverticulosis.  No abdominal fluid collection, hemorrhage, hematoma, adenopathy, or inguinal abnormality.  Degenerative changes noted of the spine.  IMPRESSION: Acute small bowel enteritis involving a few loops of the  jejunum at the level of the fat containing umbilical hernia.  This could be related to infectious or inflammatory process, less likely ischemic.  Alternatively, the small bowel inflammation may be related to recent umbilical hernia reduction.  Small amount of abdominal and pelvic free fluid.  Negative for pneumatosis, small bowel obstruction, or free air  Minor sigmoid diverticulosis  Incidental left renal cyst  Small pericardial effusion  Original Report Authenticated By: Judie Petit. Ruel Favors, M.D.   Mm Digital Screening  10/09/2011  DG SCREEN MAMMOGRAM BILATERAL Bilateral CC and MLO view(s) were taken.  DIGITAL SCREENING MAMMOGRAM WITH CAD: The breast tissue is heterogeneously dense.  No masses or malignant type calcifications are  identified.  Compared with prior studies.  Images were processed with CAD.  IMPRESSION: No specific mammographic evidence of malignancy.  Next screening mammogram is recommended in one  year.  A result letter of this screening mammogram will be mailed directly to the patient.  ASSESSMENT: Negative - BI-RADS 1  Screening mammogram in 1 year. ,     Gastroenteritis   MDM  Patient here with several day history of worsening umbilical and RUQ abdominal pain - labs without abnormalities and CT scan shows some mild enteritis without abscess formation or incarceration at the area of the umbilical hernia - will give pain and nausea medication and she can follow up with [facility] in 2-3 days.  Patient agreeable to this plan.  No evidence for cholelithiasis or cholecystitis.        Izola Price [place], [place] 10/23/11 1030

## 2011-10-23 NOTE — Discharge Instructions (Signed)
Colitis Colitis is inflammation of the colon. Colitis can be a short-term or long-standing (chronic) illness. Crohn's disease and ulcerative colitis are 2 types of colitis which are chronic. They usually require lifelong treatment. CAUSES  There are many different causes of colitis, including:  Viruses.   Germs (bacteria).   Medicine reactions.  SYMPTOMS   Diarrhea.   Intestinal bleeding.   Pain.   Fever.   Throwing up (vomiting).   Tiredness (fatigue).   Weight loss.   Bowel blockage.  DIAGNOSIS  The diagnosis of colitis is based on examination and stool or blood tests. X-rays, CT scan, and colonoscopy may also be needed. TREATMENT  Treatment may include:  Fluids given through the vein (intravenously).   Bowel rest (nothing to eat or drink for a period of time).   Medicine for pain and diarrhea.   Medicines (antibiotics) that kill germs.   Cortisone medicines.   Surgery.  HOME CARE INSTRUCTIONS   Get plenty of rest.   Drink enough water and fluids to keep your urine clear or pale yellow.   Eat a well-balanced diet.   Call your caregiver for follow-up as recommended.  SEEK IMMEDIATE MEDICAL CARE IF:   You develop chills.   You have an oral temperature above 102 F (38.9 C), not controlled by medicine.   You have extreme weakness, fainting, or dehydration.   You have repeated vomiting.   You develop severe belly (abdominal) pain or are passing bloody or tarry stools.  MAKE SURE YOU:   Understand these instructions.   Will watch your condition.   Will get help right away if you are not doing well or get worse.  Document Released: 08/14/2004 Document Revised: 06/26/2011 Document Reviewed: 11/09/2009 [facility] Patient Information 2012 [place], [place].Viral Gastroenteritis Viral gastroenteritis is also called stomach flu. This illness is caused by a certain type of germ (virus). It can cause sudden watery poop (diarrhea) and throwing up (vomiting).  This can cause you to lose body fluids (dehydration). This illness usually lasts for 3 to 8 days. It usually goes away on its own. HOME CARE   Drink enough fluids to keep your pee (urine) clear or pale yellow. Drink small amounts of fluids often.   Ask your doctor how to replace body fluid losses (rehydration).   Avoid:   Foods high in sugar.   Alcohol.   Bubbly (carbonated) drinks.   Tobacco.   Juice.   Caffeine drinks.   Very hot or cold fluids.   Fatty, greasy foods.   Eating too much at one time.   Dairy products until 24 to 48 hours after your watery poop stops.   You may eat foods with active cultures (probiotics). They can be found in some yogurts and supplements.   Wash your hands well to avoid spreading the illness.   Only take medicines as told by your doctor. Do not give aspirin to children. Do not take medicines for watery poop (antidiarrheals).   Ask your doctor if you should keep taking your regular medicines.   Keep all doctor visits as told.  GET HELP RIGHT AWAY IF:   You cannot keep fluids down.   You do not pee at least once every 6 to 8 hours.   You are short of breath.   You see blood in your poop or throw up. This may look like coffee grounds.   You have belly (abdominal) pain that gets worse or is just in one small spot (localized).   You  keep throwing up or having watery poop.   You have a fever.   The patient is a child younger than 3 months, and he or she has a fever.   The patient is a child older than 3 months, and he or she has a fever and problems that do not go away.   The patient is a child older than 3 months, and he or she has a fever and problems that suddenly get worse.   The patient is a baby, and he or she has no tears when crying.  MAKE SURE YOU:   Understand these instructions.   Will watch your condition.   Will get help right away if you are not doing well or get worse.  Document Released: 12/24/2007 Document  Revised: 06/26/2011 Document Reviewed: 04/23/2011 [facility] Patient Information 2012 [place], [place].

## 2011-10-23 NOTE — ED Notes (Signed)
Provider at bedside

## 2011-10-23 NOTE — ED Provider Notes (Signed)
Medical screening examination/treatment/procedure(s) were conducted as a shared visit with non-physician practitioner(s) and myself.  I personally evaluated the patient during the encounter.  Please see completed note for this encounter.  Raeford Razor, MD 10/23/11 [phone]

## 2011-10-23 NOTE — ED Notes (Signed)
Pt reports two day hx of severe right upper quadrant pain, no associated symptoms.

## 2011-10-23 NOTE — ED Provider Notes (Signed)
Medical screening examination/treatment/procedure(s) were conducted as a shared visit with non-physician practitioner(s) and myself.  I personally evaluated the patient during the encounter.  50yF with abdominal pain. Gradual onset 2d ago. Periumbilical to RUQ. Relatively constant without appreciable exacerbating or relieving factors. Nausea but no vomiting. No urinary complaints. No diarrhea. Surgical history significant for hysterectomy about 10 years ago. Denies recent procedures. Exam somewhat limited due to body habitus. Does not appear to be distended. Patient does have some tenderness periumbilically with a small umbilical hernia but this is easily reducible. Tenderness is more severe in the right upper quadrant with voluntary guarding. There is no rebound tenderness. Lungs are clear. Mildly tachycardic without murmur. Possible cholelithiasis. Less likely cholecystitis. Patient is afebrile. She is mildly tachycardic but suspect that this is more related to pain. Consider obstruction with patient's history of abdominal surgery she is having bowel movements and there is no vomiting. Plan basic labs. CT of the abdomen pelvis. Possible right upper quadrant ultrasound. Pain medication.   Raeford Razor, MD 10/23/11 [phone]

## 2011-11-03 ENCOUNTER — Telehealth: Payer: Self-pay | Admitting: *Deleted

## 2011-11-03 ENCOUNTER — Encounter: Payer: Self-pay | Admitting: Internal Medicine

## 2011-11-03 ENCOUNTER — Ambulatory Visit (INDEPENDENT_AMBULATORY_CARE_PROVIDER_SITE_OTHER): Payer: Self-pay | Admitting: Pharmacist

## 2011-11-03 ENCOUNTER — Ambulatory Visit (INDEPENDENT_AMBULATORY_CARE_PROVIDER_SITE_OTHER): Payer: Self-pay | Admitting: Internal Medicine

## 2011-11-03 VITALS — BP 138/78 | HR 71 | Temp 96.9°F | Ht 67.0 in | Wt 306.7 lb

## 2011-11-03 DIAGNOSIS — R600 Localized edema: Secondary | ICD-10-CM | POA: Insufficient documentation

## 2011-11-03 DIAGNOSIS — Z7901 Long term (current) use of anticoagulants: Secondary | ICD-10-CM

## 2011-11-03 DIAGNOSIS — I2699 Other pulmonary embolism without acute cor pulmonale: Secondary | ICD-10-CM

## 2011-11-03 DIAGNOSIS — R609 Edema, unspecified: Secondary | ICD-10-CM

## 2011-11-03 MED ORDER — WARFARIN SODIUM 10 MG PO TABS: ORAL_TABLET | ORAL | Status: DC

## 2011-11-03 NOTE — Assessment & Plan Note (Signed)
Ongoing for a long time off and on but persistent for past 2-3 months She has been up on her feet for long time everyday lately and more than usual No other symptoms associated with it.  Her ECho in 2011 was normal, chemistry this month did not reveal and renal or hepatic dysfunction, albumin was mildly low.   DD- venous insufficiency after DVT, obesity, Norvasc side effect  Plan - D/c norvasc - increase ACE-HCTZ to 40-25 - Leg elevation, salt restriction - Wt loss emphasis - Follow up in 2-4 weeks, TED hose if no improvement. Repeat BMET at that visit.

## 2011-11-03 NOTE — Progress Notes (Signed)
Anti-Coagulation Progress Note  Breanna Wilkins is a 51 y.o. female who is currently on an anti-coagulation regimen.    RECENT RESULTS: Recent results are below, the most recent result is correlated with a dose of 45 mg. per week: Lab Results  Component Value Date   INR 2.60 11/03/2011   INR 2.42* 10/23/2011   INR 3.50 10/06/2011    ANTI-COAG DOSE:   Latest dosing instructions   Total [name] Tue Wed Thu Fri Sat   45 5 mg 10 mg 5 mg 5 mg 5 mg 10 mg 5 mg    (10 mg0.5) (10 mg1) (10 mg0.5) (10 mg0.5) (10 mg0.5) (10 mg1) (10 mg0.5)         ANTICOAG SUMMARY: Anticoagulation Episode Summary              Current INR goal 2.0-3.0 Next INR check 12/01/2011   INR from last check 2.60 (11/03/2011)     Weekly max dose (mg)  Target end date    Indications    INR check location [facility] Preferred lab    Send INR reminders to    Comments             ANTICOAG TODAY: Anticoagulation Summary as of 11/03/2011              INR goal 2.0-3.0     Selected INR 2.60 (11/03/2011) Next INR check 12/01/2011   Weekly max dose (mg)  Target end date    Indications     Anticoagulation Episode Summary              INR check location [facility] Preferred lab    Send INR reminders to    Comments             PATIENT INSTRUCTIONS: Patient Instructions  Patient instructed to take medications as defined in the Anti-coagulation Track section of this encounter.  Patient instructed to take today's dose.  Patient verbalized understanding of these instructions.        FOLLOW-UP Return in 4 weeks (on 12/01/2011) for Follow up INR.  Hulen Luster, III Pharm.D., CACP

## 2011-11-03 NOTE — Progress Notes (Signed)
Patient ID: Breanna Wilkins, female   DOB: 1960-11-28, 51 y.o.   MRN: [id number]  51 Y/o with pmh listed below comes for swelling in her legs b/l Ongoing for past 2-3 months but was present before as well off and on Sometime painful when swells up a lot. She was seen in an Ed in [place] for this and was prescribed vicodin for pain.   She denies any focal area of pain in her legs, any warmth or redness, cough, SOB, abd distension or any other complaints associated with the swelling.   Physical exam  General Appearance:     Filed Vitals:   11/03/11 1013  BP: 138/78  Pulse: 71  Temp: 96.9 F (36.1 C)  TempSrc: Oral  Height: 5\' 7"  (1.702 m)  Weight: 306 lb 11.2 oz (139.118 kg)  SpO2: 97%     Alert, cooperative, no distress, appears stated age  Head:    Normocephalic, without obvious abnormality, atraumatic  Eyes:    PERRL, conjunctiva/corneas clear, EOM's intact, fundi    benign, both eyes       Neck:   Supple, symmetrical, trachea midline, no adenopathy;       thyroid:  No enlargement/tenderness/nodules; no carotid   bruit or JVD  Lungs:     Clear to auscultation bilaterally, respirations unlabored  Chest wall:    No tenderness or deformity  Heart:    Regular rate and rhythm, S1 and S2 normal, no murmur, rub   or gallop  Abdomen:     Soft, non-tender, bowel sounds active all four quadrants,    no masses, no organomegaly  Extremities:   1-2 + edema b/l, no calf tenderenss, Homan's sign negative. Extremities normal, atraumatic, no cyanosis  Pulses:   2+ and symmetric all extremities  Skin:   Skin color, texture, turgor normal, no rashes or lesions  Neurologic:  nonfocal grossly   ROS- as per HPI ROS  Constitutional: Denies fever, chills, diaphoresis, appetite change and fatigue.  Respiratory: Denies SOB, DOE, cough, chest tightness,  and wheezing.   Cardiovascular: Denies chest pain, palpitations and leg swelling.  Gastrointestinal: Denies nausea, vomiting, abdominal pain,  diarrhea, constipation, blood in stool and abdominal distention.  Skin: Denies pallor, rash and wound.  Neurological: Denies dizziness, light-headedness, numbness and headaches.

## 2011-11-03 NOTE — Telephone Encounter (Signed)
Pt in clinic seeing Dr Alexandria Lodge - has had swelling both legs past few days. Going back and forth from [place] due to emergency. Appt made 10:15AM this AM.

## 2011-11-03 NOTE — Patient Instructions (Signed)
Patient instructed to take medications as defined in the Anti-coagulation Track section of this encounter.  Patient instructed to take today's dose.  Patient verbalized understanding of these instructions.    

## 2011-11-03 NOTE — Patient Instructions (Signed)
Stop taking Norvasc (the white pill)  And Take 2 tablets of the combination pill every day  Edema Edema is an abnormal build-up of fluids in tissues. Because this is partly dependent on gravity (water flows to the lowest place), it is more common in the leg sand thighs (lower extremities). It is also common in the looser tissues, like around the eyes. Painless swelling of the feet and ankles is common and increases as a person ages. It may affect both legs and may include the calves or even thighs. When squeezed, the fluid may move out of the affected area and may leave a dent for a few moments. CAUSES   Prolonged standing or sitting in one place for extended periods of time. Movement helps pump tissue fluid into the veins, and absence of movement prevents this, resulting in edema.   Varicose veins. The valves in the veins do not work as well as they should. This causes fluid to leak into the tissues.   Fluid and salt overload.   Injury, burn, or surgery to the leg, ankle, or foot, may damage veins and allow fluid to leak out.   Sunburn damages vessels. Leaky vessels allow fluid to go out into the sunburned tissues.   Allergies (from insect bites or stings, medications or chemicals) cause swelling by allowing vessels to become leaky.   Protein in the blood helps keep fluid in your vessels. Low protein, as in malnutrition, allows fluid to leak out.   Hormonal changes, including pregnancy and menstruation, cause fluid retention. This fluid may leak out of vessels and cause edema.   Medications that cause fluid retention. Examples are sex hormones, blood pressure medications, steroid treatment, or anti-depressants.   Some illnesses cause edema, especially heart failure, kidney disease, or liver disease.   Surgery that cuts veins or lymph nodes, such as surgery done for the heart or for breast cancer, may result in edema.  DIAGNOSIS  Your caregiver is usually easily able to determine what is  causing your swelling (edema) by simply asking what is wrong (getting a history) and examining you (doing a physical). Sometimes x-rays, EKG (electrocardiogram or heart tracing), and blood work may be done to evaluate for underlying medical illness. TREATMENT  General treatment includes:  Leg elevation (or elevation of the affected body part).   Restriction of fluid intake.   Prevention of fluid overload.   Compression of the affected body part. Compression with elastic bandages or support stockings squeezes the tissues, preventing fluid from entering and forcing it back into the blood vessels.   Diuretics (also called water pills or fluid pills) pull fluid out of your body in the form of increased urination. These are effective in reducing the swelling, but can have side effects and must be used only under your caregiver's supervision. Diuretics are appropriate only for some types of edema.  The specific treatment can be directed at any underlying causes discovered. Heart, liver, or kidney disease should be treated appropriately. HOME CARE INSTRUCTIONS   Elevate the legs (or affected body part) above the level of the heart, while lying down.   Avoid sitting or standing still for prolonged periods of time.   Avoid putting anything directly under the knees when lying down, and do not wear constricting clothing or garters on the upper legs.   Exercising the legs causes the fluid to work back into the veins and lymphatic channels. This may help the swelling go down.   The pressure applied by elastic  bandages or support stockings can help reduce ankle swelling.   A low-salt diet may help reduce fluid retention and decrease the ankle swelling.   Take any medications exactly as prescribed.  SEEK MEDICAL CARE IF:  Your edema is not responding to recommended treatments. SEEK IMMEDIATE MEDICAL CARE IF:   You develop shortness of breath or chest pain.   You cannot breathe when you lay down;  or if, while lying down, you have to get up and go to the window to get your breath.   You are having increasing swelling without relief from treatment.   You develop a fever over 102 F (38.9 C).   You develop pain or redness in the areas that are swollen.   Tell your caregiver right away if you have gained 3 lb/1.4 kg in 1 day or 5 lb/2.3 kg in a week.  MAKE SURE YOU:   Understand these instructions.   Will watch your condition.   Will get help right away if you are not doing well or get worse.  Document Released: 07/07/2005 Document Revised: 06/26/2011 Document Reviewed: 02/23/2008 [facility] Patient Information 2012 [place], [place].

## 2011-12-01 ENCOUNTER — Ambulatory Visit (INDEPENDENT_AMBULATORY_CARE_PROVIDER_SITE_OTHER): Payer: Medicaid Other | Admitting: Pharmacist

## 2011-12-01 DIAGNOSIS — I2699 Other pulmonary embolism without acute cor pulmonale: Secondary | ICD-10-CM

## 2011-12-01 DIAGNOSIS — Z7901 Long term (current) use of anticoagulants: Secondary | ICD-10-CM

## 2011-12-01 NOTE — Patient Instructions (Signed)
Patient instructed to take medications as defined in the Anti-coagulation Track section of this encounter.  Patient instructed to take today's dose.  Patient verbalized understanding of these instructions.    

## 2011-12-01 NOTE — Progress Notes (Signed)
Anti-Coagulation Progress Note  Breanna Wilkins is a 51 y.o. female who is currently on an anti-coagulation regimen.    RECENT RESULTS: Recent results are below, the most recent result is correlated with a dose of 45 mg. per week: Lab Results  Component Value Date   INR 3.80 12/01/2011   INR 2.60 11/03/2011   INR 2.42* 10/23/2011    ANTI-COAG DOSE:   Latest dosing instructions   Total [name]   40 5 mg 5 mg 5 mg 10 mg 5 mg 5 mg 5 mg    (10 mg0.5) (10 mg0.5) (10 mg0.5) (10 mg1) (10 mg0.5) (10 mg0.5) (10 mg0.5)         ANTICOAG SUMMARY: Anticoagulation Episode Summary              Current INR goal 2.0-3.0 Next INR check 12/29/2011   INR from last check 3.80! (12/01/2011)     Weekly max dose (mg)  Target end date    Indications    [facility] check location [facility]    Send INR reminders to    Comments             ANTICOAG TODAY: Anticoagulation Summary as of 12/01/2011              INR goal 2.0-3.0     Selected INR 3.80! (12/01/2011) Next INR check 12/29/2011   Weekly max dose (mg)  Target end date    Indications     Anticoagulation Episode Summary              INR check location [facility] Preferred lab    Send INR reminders to    Comments             PATIENT INSTRUCTIONS: Patient Instructions  Patient instructed to take medications as defined in the Anti-coagulation Track section of this encounter.  Patient instructed to take today's dose.  Patient verbalized understanding of these instructions.        FOLLOW-UP Return in 4 weeks (on 12/29/2011) for Follow up INR.  Hulen Luster, III Pharm.D., CACP

## 2011-12-03 ENCOUNTER — Ambulatory Visit (INDEPENDENT_AMBULATORY_CARE_PROVIDER_SITE_OTHER): Payer: Medicaid Other | Admitting: Ophthalmology

## 2011-12-03 ENCOUNTER — Encounter: Payer: Self-pay | Admitting: Ophthalmology

## 2011-12-03 VITALS — BP 131/76 | HR 68 | Temp 97.9°F | Wt 306.4 lb

## 2011-12-03 DIAGNOSIS — R609 Edema, unspecified: Secondary | ICD-10-CM

## 2011-12-03 DIAGNOSIS — X58XXXA Exposure to other specified factors, initial encounter: Secondary | ICD-10-CM

## 2011-12-03 DIAGNOSIS — M7989 Other specified soft tissue disorders: Secondary | ICD-10-CM

## 2011-12-03 DIAGNOSIS — M75100 Unspecified rotator cuff tear or rupture of unspecified shoulder, not specified as traumatic: Secondary | ICD-10-CM

## 2011-12-03 DIAGNOSIS — S46819A Strain of other muscles, fascia and tendons at shoulder and upper arm level, unspecified arm, initial encounter: Secondary | ICD-10-CM

## 2011-12-03 DIAGNOSIS — R600 Localized edema: Secondary | ICD-10-CM

## 2011-12-03 NOTE — Assessment & Plan Note (Signed)
Patient did not see much benefit from stopping norvasc. Prescribed compression stocking with minium 20-20mm Hg compression at the ankle. Advised her to put on stocking first thing in the morning when legs are free of swelling. She is able to put stocking on herself.

## 2011-12-03 NOTE — Assessment & Plan Note (Addendum)
Patient has completed physical therapy and has had injections to the region. Since it is not an acute tear I am unsure of what benefit surgery would have at this point. Since patient has medicaid now, can refer to orthopedics at next visit if indicated. Re-referred to sports medicine since in last note in March, Dr. Jennette Kettle was concerned for adhesive capsulitis. Advised her to use 650mg  of tylenol to help control pain. Concern to use NSAID on longer term basis since she is on coumadin.

## 2011-12-03 NOTE — Progress Notes (Signed)
Subjective:   Patient ID: Breanna Wilkins female   DOB: 18-Sep-1960 51 y.o.   MRN: [id number]  HPI: [name] is a 51 y.o. woman who presents for follow up for bilateral leg swelling. Doesn't feel like the swelling is any better but she has been up on her feet more. Stopped norvasc. Hasn't been elevating legs much, only at night.   Is feeling short of breath with walking from her car. Is wondering if she can get a handicap sticker. Feels faint with walking up one flight of stairs. No wheezing or coughing. She feels like she has been more short of breath since she gained the weight.   She has been trying to lose weight and has been exercising at the gym for 45 minutes a day 5 days a week. She stopped drinking beer altogether. Was drinking 2 cases a week. Is hoping to change eating habits too. Would meet with Gavin Pound. Went to a class in the past. Has not lost weight, has gained 30+ lbs.  Is complaining of pain in her right shoulder, hurts most at night, hurts with raising arm over her head. Has had pain for years and was diagnosed with rotator cuff tear- supraspinatus. Hasn't been taking anything over the counter. Is on coumadin. Has already been to sports medicine & physical therapy. Wants to go to a different doctor at the same practice. Got medicaid recently.  Past Medical History  Diagnosis Date  . Shoulder pain   . Pulmonary embolus 01/16/10    on Coumadin  . OSA (obstructive sleep apnea)   . Abdominal hernia   . Hypertension   . Overweight   . Lung nodule 12/2009    Very small, left upper lobe by CT June, 2011, one-year followup was stable  . Shortness of breath   . Warfarin anticoagulation   . Ejection fraction     65-70%, vigorous function, echo, March, 2011   Current Outpatient Prescriptions  Medication Sig Dispense Refill  . lisinopril-hydrochlorothiazide (PRINZIDE,ZESTORETIC) 20-12.5 MG per tablet Take 2 tablets by mouth every morning.  60 tablet  6  . warfarin  (COUMADIN) 10 MG tablet 10 mg (1 tablet)  on Monday and Friday, 5 mg (1/2 half-tablet) all other days.  30 tablet  3   Family History  Problem Relation Age of Onset  . Diabetes Father   . Diabetes Sister   . Diabetes Mother   . Deep vein thrombosis Sister   . Ovarian cancer Mother   . Bone cancer Maternal Grandmother    History   Social History  . Marital Status: Single    Spouse Name: N/A    Number of Children: N/A  . Years of Education: N/A   Social History Main Topics  . Smoking status: Former Games developer  . Smokeless tobacco: None  . Alcohol Use: 3.0 oz/week    6 drink(s) per week     6 pack of beer  . Drug Use: Yes    Special: Marijuana     smoked joint 2 weeks ago  . Sexually Active: None   Other Topics Concern  . None   Social History Narrative   Financial assistance approved for 100% discount at [facility] and has [facility] card per Gavin [place]6/01/2010    Objective:  Physical Exam: Filed Vitals:   12/03/11 0912  BP: 131/76  Pulse: 68  Temp: 97.9 F (36.6 C)  TempSrc: Oral  Weight: 306 lb 6.4 oz (138.982 kg)   General: friendly obese woman  sitting in chair HEENT: PERRL, EOMI, no scleral icterus Ext: warm and well perfused, 2+ pitting pedal edema 2/3 of the way up shin.  Shoulder: pain with raising arm above 90 degrees, no pain with lowering arm or reaching across body, some pain on reaching behind her back. Neuro: alert and oriented X3, cranial nerves II-XII grossly intact  Assessment & Plan:

## 2011-12-03 NOTE — Patient Instructions (Signed)
-  tylenol 650mg  mg (2 of regular pills), try taking 3 times a day for 3 days to get pain under control and then you could take it only at night if that is the only time it is bothering you. -Make appointment to see Lupita Leash Plyler so you can change your diet

## 2011-12-08 ENCOUNTER — Ambulatory Visit: Payer: Medicaid Other | Admitting: Family Medicine

## 2011-12-08 ENCOUNTER — Telehealth: Payer: Self-pay | Admitting: *Deleted

## 2011-12-08 NOTE — Telephone Encounter (Signed)
CALLED AND SPOKE WITH MS Catano ABOUT HER CANCELLING HER SPORTS MEDICINE APPT TODAY. SHE SAID THAT SHE DID NOT FEEL WELL AND THAT HER FEET WERE SO SWOLLEN . SHE DID NOT WANT ME TO RESCHEDULE HER ANOTHER APPT WITH THE [facility],THAT SHE WILL CONTACT us WHEN SHE WANTED TO GO. STATES SHE MIGHT GO TO THE ED, SHE ALSO FEELS SHORT OF BREATH. INSTRUCTED THE PATIENT TO GO TO THE ED IF THIS CONTINUES OR GET WORSE.  Jeweldean Drohan NT 5-20-013  5:39PM

## 2011-12-24 ENCOUNTER — Telehealth: Payer: Self-pay | Admitting: *Deleted

## 2011-12-24 NOTE — Telephone Encounter (Signed)
Discuss directions on Lisinopril / HCTZ 20-12.5mg  2 tab every AM. Discuss appt time with Dr Alexandria Lodge and Dr Anselm Jungling. Stanton Kidney Emi Lymon RN 12/24/11 3:15PM

## 2011-12-29 ENCOUNTER — Ambulatory Visit (INDEPENDENT_AMBULATORY_CARE_PROVIDER_SITE_OTHER): Payer: Self-pay | Admitting: Pharmacist

## 2011-12-29 DIAGNOSIS — Z7901 Long term (current) use of anticoagulants: Secondary | ICD-10-CM

## 2011-12-29 DIAGNOSIS — I2699 Other pulmonary embolism without acute cor pulmonale: Secondary | ICD-10-CM

## 2011-12-29 LAB — POCT INR: INR: 2.9

## 2011-12-29 MED ORDER — WARFARIN SODIUM 10 MG PO TABS: ORAL_TABLET | ORAL | Status: DC

## 2011-12-29 NOTE — Patient Instructions (Signed)
Patient instructed to take medications as defined in the Anti-coagulation Track section of this encounter.  Patient instructed to take today's dose.  Patient verbalized understanding of these instructions.    

## 2011-12-29 NOTE — Progress Notes (Signed)
Anti-Coagulation Progress Note  Breanna Wilkins is a 51 y.o. female who is currently on an anti-coagulation regimen.    RECENT RESULTS: Recent results are below, the most recent result is correlated with a dose of 40 mg. per week: Lab Results  Component Value Date   INR 2.90 12/29/2011   INR 3.80 12/01/2011   INR 2.60 11/03/2011    ANTI-COAG DOSE:   Latest dosing instructions   Total [name] Tue Wed Thu Fri Sat   35 5 mg 5 mg 5 mg 5 mg 5 mg 5 mg 5 mg    (10 mg0.5) (10 mg0.5) (10 mg0.5) (10 mg0.5) (10 mg0.5) (10 mg0.5) (10 mg0.5)         ANTICOAG SUMMARY: Anticoagulation Episode Summary              Current INR goal 2.0-3.0 Next INR check 01/26/2012   INR from last check 2.90 (12/29/2011)     Weekly max dose (mg)  Target end date    Indications    [facility] check location [facility] lab    Send INR reminders to    Comments             ANTICOAG TODAY: Anticoagulation Summary as of 12/29/2011              INR goal 2.0-3.0     Selected INR 2.90 (12/29/2011) Next INR check 01/26/2012   Weekly max dose (mg)  Target end date    Indications     Anticoagulation Episode Summary              INR check location [facility] Preferred lab    Send INR reminders to    Comments             PATIENT INSTRUCTIONS: Patient Instructions  Patient instructed to take medications as defined in the Anti-coagulation Track section of this encounter.  Patient instructed to take today's dose.  Patient verbalized understanding of these instructions.        FOLLOW-UP Return in 4 weeks (on 01/26/2012).  Hulen Luster, III Pharm.D., CACP

## 2011-12-30 NOTE — Progress Notes (Signed)
Ms. Mattia' interim history was reviewed and I agree with Dr. Saralyn Pilar assessment and plan as documented.  Please note that she apparently had a provoked DVT in August 2012.  The plan is to treat with coumadin for 1 year and then obtain a thrombophilia panel after 1-2 months off of anticoagulation if the patient agrees to stop coumadin to reassess.

## 2012-01-20 ENCOUNTER — Encounter: Payer: Self-pay | Admitting: Internal Medicine

## 2012-01-20 ENCOUNTER — Ambulatory Visit (INDEPENDENT_AMBULATORY_CARE_PROVIDER_SITE_OTHER): Payer: Self-pay | Admitting: Internal Medicine

## 2012-01-20 VITALS — BP 129/77 | HR 87 | Temp 97.0°F | Wt 306.0 lb

## 2012-01-20 DIAGNOSIS — R600 Localized edema: Secondary | ICD-10-CM

## 2012-01-20 DIAGNOSIS — L259 Unspecified contact dermatitis, unspecified cause: Secondary | ICD-10-CM

## 2012-01-20 DIAGNOSIS — X58XXXA Exposure to other specified factors, initial encounter: Secondary | ICD-10-CM

## 2012-01-20 DIAGNOSIS — M75100 Unspecified rotator cuff tear or rupture of unspecified shoulder, not specified as traumatic: Secondary | ICD-10-CM

## 2012-01-20 DIAGNOSIS — S46819A Strain of other muscles, fascia and tendons at shoulder and upper arm level, unspecified arm, initial encounter: Secondary | ICD-10-CM

## 2012-01-20 DIAGNOSIS — L309 Dermatitis, unspecified: Secondary | ICD-10-CM

## 2012-01-20 DIAGNOSIS — I10 Essential (primary) hypertension: Secondary | ICD-10-CM

## 2012-01-20 DIAGNOSIS — M25519 Pain in unspecified shoulder: Secondary | ICD-10-CM

## 2012-01-20 DIAGNOSIS — R0602 Shortness of breath: Secondary | ICD-10-CM

## 2012-01-20 DIAGNOSIS — E785 Hyperlipidemia, unspecified: Secondary | ICD-10-CM

## 2012-01-20 LAB — LIPID PANEL
Cholesterol: 277 mg/dL — ABNORMAL HIGH (ref 0–200)
Total CHOL/HDL Ratio: 5.4 Ratio
Triglycerides: 156 mg/dL — ABNORMAL HIGH (ref <150)
VLDL: 31 mg/dL (ref 0–40)

## 2012-01-20 LAB — BASIC METABOLIC PANEL WITH GFR
CO2: 28 mEq/L (ref 19–32)
Chloride: 98 mEq/L (ref 96–112)
GFR, Est African American: 81 mL/min
Potassium: 4.4 mEq/L (ref 3.5–5.3)
Sodium: 138 mEq/L (ref 135–145)

## 2012-01-20 MED ORDER — LISINOPRIL 40 MG PO TABS: 40.0000 mg | ORAL_TABLET | Freq: Every day | ORAL | Status: DC

## 2012-01-20 MED ORDER — FUROSEMIDE 20 MG PO TABS: 20.0000 mg | ORAL_TABLET | Freq: Every day | ORAL | Status: DC

## 2012-01-20 MED ORDER — OXYCODONE-ACETAMINOPHEN 5-325 MG PO TABS: 1.0000 | ORAL_TABLET | ORAL | Status: AC | PRN

## 2012-01-20 MED ORDER — TRIAMCINOLONE ACETONIDE 0.5 % EX CREA: TOPICAL_CREAM | Freq: Two times a day (BID) | CUTANEOUS | Status: DC

## 2012-01-20 NOTE — Assessment & Plan Note (Signed)
Like a contact dermatitis around her left lower abdomen with small papules. -Will start Kenalog cream twice a day when necessary

## 2012-01-20 NOTE — Assessment & Plan Note (Signed)
Patient continues to have right shoulder pain and that her MRI shows full thickness supraspinatus tendon tear.  Patient was seen by sport medicine in the past and did not really like physical therapy. -Will refer her to Dr. Dion Saucier for further evaluation and treatment

## 2012-01-20 NOTE — Progress Notes (Signed)
HPI: Breanna Wilkins is a 51 year old woman with past medical history of hypertension, pulmonary embolism, obstructive sleep apnea, grade 1 diastolic heart failure presents today for a rash on her LL abdomen  x1-2 weeks in duration associated with itching, tiny papules.  She denies any recent change in her detergent or soap or any new food.  She continues to have Right shoulder arm pain and have limited ROM of her R shoulder.  She has been taking Percocet which helps her pain. She would like to be referred to an orthopedist for further evaluation and treatment.   She also complains of SOB which has worsen.  She gets SOB after walking about 1 block and sleeps on 3 pillows at night.  No chest pain.  + leg edema L>R  ROS:as per HPI  PE: General: alert, well-developed, and cooperative to examination.  Lungs: normal respiratory effort, no accessory muscle use, normal breath sounds, no crackles, and no wheezes. Heart: normal rate, regular rhythm, no murmur, no gallop, and no rub. No JVD Abdomen: soft, non-tender, normal bowel sounds, no distention, no guarding, no rebound tenderness, no hepatomegaly, and no splenomegaly. Tiny papules noted on her left lower abdomen with mild erythema.  No pustules or drainage. Msk: Right shoulder: limited range of motion-can abduct to about 70-80 degrees with tenderness.  + Apley's Scratch test of the right arm.   Pulses: 2+ DP/PT pulses bilaterally Extremities: No cyanosis, clubbing,L>R LE pitting edema +1 Neurologic: alert & oriented X3, cranial nerves II-XII intact, strength normal in all extremities, sensation intact to light touch, and gait normal.

## 2012-01-20 NOTE — Patient Instructions (Addendum)
Stop taking lisinopril/hydrochlorothiazide Start taking Lisinopril 40mg  daily Start taking Lasix 20mg  daily Take Percocet 5mg  one tablet every 4 hours as needed for pain We will refer you to orthopedist-Dr Dion Saucier Make sure you follow up with Dr. Myrtis Ser Will schedule for echocardiogram Follow up in 6-8 weeks with Dr. Anselm Jungling

## 2012-01-20 NOTE — Assessment & Plan Note (Signed)
Well-controlled. Given her increase in shortness of breath, I will stop the combination pill of lisinopril/hydrochlorothiazide. -Start lisinopril 40 mg daily -Start Lasix 20 mg daily to see if her shortness of breath is improved which is likely secondary to her diastolic heart failure. -Will get a BMP, and pro BNP today -Will schedule for an echocardiogram -Will have patient follow up with Dr. Myrtis Ser after her echocardiogram -Patient was advised to go to the emergency room if her shortness of breath increase

## 2012-01-20 NOTE — Assessment & Plan Note (Signed)
Differential diagnoses include PE versus heart failure. Patient does require a pillows to sleep on at night and that her echocardiogram in 2011 shows grade 1 diastolic heart failure.  -Start Lasix 20 mg daily to see if her shortness of breath is improved which is likely secondary to her diastolic heart failure. -Will get a BMP as baseline, and pro BNP today -Will repeat BMP on 01/26/2012 to make sure that her kidney functions are within normal limit -Will schedule for an echocardiogram -Will have patient follow up with Dr. Myrtis Ser after her echocardiogram (patient was last seen by Dr. Myrtis Ser in September of 2012) -Patient was advised to go to the emergency room if her shortness of breath increase

## 2012-01-26 ENCOUNTER — Other Ambulatory Visit: Payer: Self-pay

## 2012-01-26 ENCOUNTER — Ambulatory Visit (INDEPENDENT_AMBULATORY_CARE_PROVIDER_SITE_OTHER): Payer: Self-pay | Admitting: Pharmacist

## 2012-01-26 DIAGNOSIS — I10 Essential (primary) hypertension: Secondary | ICD-10-CM

## 2012-01-26 DIAGNOSIS — Z7901 Long term (current) use of anticoagulants: Secondary | ICD-10-CM

## 2012-01-26 DIAGNOSIS — I2699 Other pulmonary embolism without acute cor pulmonale: Secondary | ICD-10-CM

## 2012-01-26 NOTE — Progress Notes (Signed)
Anti-Coagulation Progress Note  Breanna Wilkins is a 51 y.o. female who is currently on an anti-coagulation regimen.    RECENT RESULTS: Recent results are below, the most recent result is correlated with a dose of 35 mg. per week: Lab Results  Component Value Date   INR 1.60 01/26/2012   INR 2.90 12/29/2011   INR 3.80 12/01/2011    ANTI-COAG DOSE:   Latest dosing instructions   Total [name] Tue Wed Thu Fri Sat   45 5 mg 10 mg 5 mg 5 mg 10 mg 5 mg 5 mg    (10 mg0.5) (10 mg1) (10 mg0.5) (10 mg0.5) (10 mg1) (10 mg0.5) (10 mg0.5)         ANTICOAG SUMMARY: Anticoagulation Episode Summary              Current INR goal 2.0-3.0 Next INR check 02/16/2012   INR from last check 1.60! (01/26/2012)     Weekly max dose (mg)  Target end date    Indications    [facility] check location [facility]    Send INR reminders to    Comments             ANTICOAG TODAY: Anticoagulation Summary as of 01/26/2012              INR goal 2.0-3.0     Selected INR 1.60! (01/26/2012) Next INR check 02/16/2012   Weekly max dose (mg)  Target end date    Indications     Anticoagulation Episode Summary              INR check location [facility] Preferred lab    Send INR reminders to    Comments             PATIENT INSTRUCTIONS: Patient Instructions  Patient instructed to take medications as defined in the Anti-coagulation Track section of this encounter.  Patient instructed to take today's dose.  Patient verbalized understanding of these instructions.        FOLLOW-UP Return in 3 weeks (on 02/16/2012) for Follow up INR at 1045h.  Hulen Luster, III Pharm.D., CACP

## 2012-01-26 NOTE — Patient Instructions (Signed)
Patient instructed to take medications as defined in the Anti-coagulation Track section of this encounter.  Patient instructed to take today's dose.  Patient verbalized understanding of these instructions.    

## 2012-01-27 ENCOUNTER — Other Ambulatory Visit: Payer: Self-pay | Admitting: Internal Medicine

## 2012-01-27 LAB — BASIC METABOLIC PANEL WITH GFR
BUN: 17 mg/dL (ref 6–23)
CO2: 27 mEq/L (ref 19–32)
Calcium: 9.3 mg/dL (ref 8.4–10.5)
GFR, Est African American: >89 mL/min
Glucose, Bld: 108 mg/dL — ABNORMAL HIGH (ref 70–99)

## 2012-01-27 MED ORDER — PRAVASTATIN SODIUM 40 MG PO TABS: 40.0000 mg | ORAL_TABLET | Freq: Every evening | ORAL | Status: DC

## 2012-02-10 ENCOUNTER — Telehealth: Payer: Self-pay | Admitting: *Deleted

## 2012-02-10 NOTE — Telephone Encounter (Signed)
Pt called itching is worse and rash is spending. Talked with Dr Anselm Jungling - stop Kenalog crean and will call in to pharmacy 2 different med - follow directions. To see 02/17/12 1:15PM. Pt aware. Stanton Kidney Khadija Thier RN 02/10/12 2:15PM

## 2012-02-14 ENCOUNTER — Other Ambulatory Visit: Payer: Self-pay | Admitting: Internal Medicine

## 2012-02-14 MED ORDER — HYDROXYZINE PAMOATE 100 MG PO CAPS: 100.0000 mg | ORAL_CAPSULE | Freq: Every evening | ORAL | Status: AC | PRN

## 2012-02-14 MED ORDER — CETIRIZINE HCL 10 MG PO TABS: 10.0000 mg | ORAL_TABLET | Freq: Every morning | ORAL | Status: DC

## 2012-02-14 MED ORDER — HALOBETASOL PROPIONATE 0.05 % EX CREA: TOPICAL_CREAM | Freq: Two times a day (BID) | CUTANEOUS | Status: DC

## 2012-02-16 ENCOUNTER — Ambulatory Visit (INDEPENDENT_AMBULATORY_CARE_PROVIDER_SITE_OTHER): Payer: Self-pay | Admitting: Pharmacist

## 2012-02-16 DIAGNOSIS — Z7901 Long term (current) use of anticoagulants: Secondary | ICD-10-CM

## 2012-02-16 DIAGNOSIS — I2699 Other pulmonary embolism without acute cor pulmonale: Secondary | ICD-10-CM

## 2012-02-16 MED ORDER — WARFARIN SODIUM 10 MG PO TABS: ORAL_TABLET | ORAL | Status: DC

## 2012-02-16 NOTE — Progress Notes (Signed)
Anti-Coagulation Progress Note  Breanna Wilkins is a 51 y.o. female who is currently on an anti-coagulation regimen.    RECENT RESULTS: Recent results are below, the most recent result is correlated with a dose of 45 mg. per week: Lab Results  Component Value Date   INR 2.90 02/16/2012   INR 1.60 01/26/2012   INR 2.90 12/29/2011    ANTI-COAG DOSE:   Latest dosing instructions   Total [name] Tue Wed Thu Fri Sat   45 5 mg 10 mg 5 mg 5 mg 10 mg 5 mg 5 mg    (10 mg0.5) (10 mg1) (10 mg0.5) (10 mg0.5) (10 mg1) (10 mg0.5) (10 mg0.5)         ANTICOAG SUMMARY: Anticoagulation Episode Summary              Current INR goal 2.0-3.0 Next INR check 03/15/2012   INR from last check 2.90 (02/16/2012)     Weekly max dose (mg)  Target end date    Indications    [facility]R check location [facility]    Send INR reminders to    Comments             ANTICOAG TODAY: Anticoagulation Summary as of 02/16/2012              INR goal 2.0-3.0     Selected INR 2.90 (02/16/2012) Next INR check 03/15/2012   Weekly max dose (mg)  Target end date    Indications     Anticoagulation Episode Summary              INR check location [facility]    Send INR reminders to    Comments             PATIENT INSTRUCTIONS: Patient Instructions  Patient instructed to take medications as defined in the Anti-coagulation Track section of this encounter.  Patient instructed to take today's dose.  Patient verbalized understanding of these instructions.        FOLLOW-UP Return in 4 weeks (on 03/15/2012) for Follow up at 1100h.  Hulen Luster, III Pharm.D., CACP

## 2012-02-16 NOTE — Patient Instructions (Signed)
Patient instructed to take medications as defined in the Anti-coagulation Track section of this encounter.  Patient instructed to take today's dose.  Patient verbalized understanding of these instructions.    

## 2012-02-16 NOTE — Addendum Note (Signed)
Addended by: Hulen Luster B on: 02/16/2012 02:57 PM   Modules accepted: Orders

## 2012-02-17 ENCOUNTER — Ambulatory Visit (INDEPENDENT_AMBULATORY_CARE_PROVIDER_SITE_OTHER): Payer: Self-pay | Admitting: Internal Medicine

## 2012-02-17 ENCOUNTER — Encounter: Payer: Self-pay | Admitting: Internal Medicine

## 2012-02-17 VITALS — BP 120/76 | HR 80 | Ht 67.0 in | Wt 309.5 lb

## 2012-02-17 DIAGNOSIS — L309 Dermatitis, unspecified: Secondary | ICD-10-CM

## 2012-02-17 DIAGNOSIS — F529 Unspecified sexual dysfunction not due to a substance or known physiological condition: Secondary | ICD-10-CM | POA: Insufficient documentation

## 2012-02-17 DIAGNOSIS — I10 Essential (primary) hypertension: Secondary | ICD-10-CM

## 2012-02-17 DIAGNOSIS — L259 Unspecified contact dermatitis, unspecified cause: Secondary | ICD-10-CM

## 2012-02-17 LAB — CBC WITH DIFFERENTIAL/PLATELET
Basophils Relative: 1 % (ref 0–1)
HCT: 38.4 % (ref 36.0–46.0)
Hemoglobin: 13 g/dL (ref 12.0–15.0)
Lymphs Abs: 1.3 10*3/uL (ref 0.7–4.0)
MCH: 27.8 pg (ref 26.0–34.0)
MCHC: 33.9 g/dL (ref 30.0–36.0)
Monocytes Absolute: 0.7 10*3/uL (ref 0.1–1.0)
Monocytes Relative: 12 % (ref 3–12)
Neutro Abs: 3.6 10*3/uL (ref 1.7–7.7)
RBC: 4.68 MIL/uL (ref 3.87–5.11)

## 2012-02-17 NOTE — Assessment & Plan Note (Addendum)
Patient reports no sexual drive and is very concerned.  She did have alcohol problem in the past but has been sober in the past month. Differential dx include: psychological problems such as depression or anxiety, stress, medications such as OCP, endometriosis or atrophic vaginitis, hyperprolactinemia.  Hypertension is also a risk factor but her HTN is well controlled.  She is not taking any OCP at this time and had a hysterectomy in the past.   -Will check CBC, TSH, prolactin level -Continue to abstain from alcohol

## 2012-02-17 NOTE — Assessment & Plan Note (Addendum)
Generalized contact dermatitis: soap, body wash, lotion, or perfume. The lesions on her lower abdomen is now resolved however patient has generalized pruritus on her neck, arms and back. She has been taking Benadryl with relief but it makes her sleepy. I prescribed Kenalog cream during last office visit and she may have had an allergic reaction to propylene glycol, a component of Kenalog.   Subsequently, I called in Ultravate, hydroxyzine, Zyrtec;however patient has not picked up her medication to cost.   -I will check a CBC, TSH today -Patient states she will try hydroxyzine pamoate and Zyrtec.  I offered Prednisone course but she declined because she does not want to gain weight -

## 2012-02-17 NOTE — Progress Notes (Signed)
History of present illness: [name] is a 51 year old woman with past medical history of hypertension, obstructive sleep apnea, DVT presents today for followup on her rash. She states that the rash on her lower abdomen has resolved after using the Kenalog and hydroctisone cream however now she has generalized pruritis.  She states that the itching is 10/10in severity especially on her neck, arms, back. The itching has been keeping her up at night and that she has beenusing Benadryl.  She has not picked up the new prescription that I have called in for her due to cost: Ultravate, hydroxyzine, Zyrtec. Patient also expressed her concern of no sexual drive and is very frustrated. She states that she has been sober from alcohol in the past one month. She denies any depression or anxiety or stress. She denies any dyspareunia or any other gynecological concerns.    Review of system: As per history of present illness  Physical examination: General: alert, well-developed, and cooperative to examination.   Lungs: normal respiratory effort, no accessory muscle use, normal breath sounds, no crackles, and no wheezes. Heart: normal rate, regular rhythm, no murmur, no gallop, and no rub.  Abdomen: soft, non-tender, normal bowel sounds, no distention, no guarding, no rebound tenderness Msk: Limited range of motion of the right shoulder secondary to pain.  Could not performed Apley's back scratch test.   Skin: turgor normal.  There are a few tiny 1-2 mm papules on her neck as well as her arms and inner thighs with excoriation marks.  No erythema noted no drainage or tenderness.  Psych: Oriented X3, memory intact for recent and remote, normally interactive, good eye contact, not anxious appearing, and not depressed appearing.

## 2012-02-17 NOTE — Progress Notes (Signed)
Talked with pt - meds were over $200.00. Has appt today with Dr Anselm Jungling.

## 2012-02-18 ENCOUNTER — Encounter: Payer: Self-pay | Admitting: *Deleted

## 2012-02-18 LAB — TSH: TSH: 1.042 u[IU]/mL (ref 0.350–4.500)

## 2012-02-18 LAB — PROLACTIN: Prolactin: 13 ng/mL

## 2012-03-15 ENCOUNTER — Ambulatory Visit (INDEPENDENT_AMBULATORY_CARE_PROVIDER_SITE_OTHER): Payer: Self-pay | Admitting: Pharmacist

## 2012-03-15 DIAGNOSIS — Z7901 Long term (current) use of anticoagulants: Secondary | ICD-10-CM

## 2012-03-15 DIAGNOSIS — I2699 Other pulmonary embolism without acute cor pulmonale: Secondary | ICD-10-CM

## 2012-03-15 LAB — POCT INR: INR: 3.5

## 2012-03-15 NOTE — Progress Notes (Signed)
Review of the chart suggests that Ms. Dowers has been on 1 year of coumadin since her DVT during knee surgery, while on coumadin.  The plan was that the risks and the benefits of continued coumadin therapy were going to be discussed at this time.  I will point this out to Drs. Groce and Ho so that they can start discussing the options, including risks and benefits, with Ms. Mcbreen at their respective follow-up visits.

## 2012-03-15 NOTE — Patient Instructions (Signed)
Patient instructed to take medications as defined in the Anti-coagulation Track section of this encounter.  Patient instructed to take today's dose.  Patient verbalized understanding of these instructions.    

## 2012-03-15 NOTE — Progress Notes (Signed)
Anti-Coagulation Progress Note  Breanna Wilkins is a 51 y.o. female who is currently on an anti-coagulation regimen.    RECENT RESULTS: Recent results are below, the most recent result is correlated with a dose of 45 mg. per week: Lab Results  Component Value Date   INR 3.50 03/15/2012   INR 2.90 02/16/2012   INR 1.60 01/26/2012    ANTI-COAG DOSE:   Latest dosing instructions   Total [name] Tue Wed Thu Fri Sat   40 5 mg 5 mg 5 mg 10 mg 5 mg 5 mg 5 mg    (10 mg0.5) (10 mg0.5) (10 mg0.5) (10 mg1) (10 mg0.5) (10 mg0.5) (10 mg0.5)         ANTICOAG SUMMARY: Anticoagulation Episode Summary              Current INR goal 2.0-3.0 Next INR check 04/12/2012   INR from last check 3.50! (03/15/2012)     Weekly max dose (mg)  Target end date    Indications    [facility] check location [facility]    Send INR reminders to    Comments             ANTICOAG TODAY: Anticoagulation Summary as of 03/15/2012              INR goal 2.0-3.0     Selected INR 3.50! (03/15/2012) Next INR check 04/12/2012   Weekly max dose (mg)  Target end date    Indications     Anticoagulation Episode Summary              INR check location [facility]    Send INR reminders to    Comments             PATIENT INSTRUCTIONS: Patient Instructions  Patient instructed to take medications as defined in the Anti-coagulation Track section of this encounter.  Patient instructed to take today's dose.  Patient verbalized understanding of these instructions.        FOLLOW-UP Return in 4 weeks (on 04/12/2012) for Follow up INR at 1115h.  Hulen Luster, III Pharm.D., CACP

## 2012-04-08 ENCOUNTER — Encounter: Payer: Self-pay | Admitting: Internal Medicine

## 2012-04-12 ENCOUNTER — Ambulatory Visit (INDEPENDENT_AMBULATORY_CARE_PROVIDER_SITE_OTHER): Payer: Self-pay | Admitting: Cardiology

## 2012-04-12 ENCOUNTER — Encounter: Payer: Self-pay | Admitting: Cardiology

## 2012-04-12 ENCOUNTER — Ambulatory Visit (INDEPENDENT_AMBULATORY_CARE_PROVIDER_SITE_OTHER): Payer: Self-pay | Admitting: Internal Medicine

## 2012-04-12 ENCOUNTER — Encounter: Payer: Self-pay | Admitting: Internal Medicine

## 2012-04-12 ENCOUNTER — Ambulatory Visit (INDEPENDENT_AMBULATORY_CARE_PROVIDER_SITE_OTHER): Payer: Self-pay | Admitting: Pharmacist

## 2012-04-12 ENCOUNTER — Ambulatory Visit: Payer: Self-pay | Admitting: Internal Medicine

## 2012-04-12 VITALS — BP 143/89 | HR 84 | Temp 97.9°F | Wt 308.7 lb

## 2012-04-12 VITALS — BP 152/102 | HR 94 | Ht 67.0 in | Wt 308.4 lb

## 2012-04-12 DIAGNOSIS — I2699 Other pulmonary embolism without acute cor pulmonale: Secondary | ICD-10-CM

## 2012-04-12 DIAGNOSIS — R0602 Shortness of breath: Secondary | ICD-10-CM

## 2012-04-12 DIAGNOSIS — IMO0002 Reserved for concepts with insufficient information to code with codable children: Secondary | ICD-10-CM | POA: Insufficient documentation

## 2012-04-12 DIAGNOSIS — Z7901 Long term (current) use of anticoagulants: Secondary | ICD-10-CM

## 2012-04-12 DIAGNOSIS — E669 Obesity, unspecified: Secondary | ICD-10-CM

## 2012-04-12 DIAGNOSIS — R0989 Other specified symptoms and signs involving the circulatory and respiratory systems: Secondary | ICD-10-CM

## 2012-04-12 DIAGNOSIS — R600 Localized edema: Secondary | ICD-10-CM

## 2012-04-12 DIAGNOSIS — I10 Essential (primary) hypertension: Secondary | ICD-10-CM

## 2012-04-12 DIAGNOSIS — R9430 Abnormal result of cardiovascular function study, unspecified: Secondary | ICD-10-CM

## 2012-04-12 LAB — POCT INR: INR: 2.8

## 2012-04-12 MED ORDER — FUROSEMIDE 40 MG PO TABS: 40.0000 mg | ORAL_TABLET | Freq: Every day | ORAL | Status: DC

## 2012-04-12 NOTE — Progress Notes (Signed)
Anti-Coagulation Progress Note  Breanna Wilkins is a 51 y.o. female who is currently on an anti-coagulation regimen.    RECENT RESULTS: Recent results are below, the most recent result is correlated with a dose of 40 mg. per week: Lab Results  Component Value Date   INR 2.80 04/12/2012   INR 3.50 03/15/2012   INR 2.90 02/16/2012    ANTI-COAG DOSE:   Latest dosing instructions   Total [name] Tue Wed Thu Fri Sat   40 5 mg 5 mg 5 mg 10 mg 5 mg 5 mg 5 mg    (10 mg0.5) (10 mg0.5) (10 mg0.5) (10 mg1) (10 mg0.5) (10 mg0.5) (10 mg0.5)         ANTICOAG SUMMARY: Anticoagulation Episode Summary              Current INR goal 2.0-3.0 Next INR check 05/17/2012   INR from last check 2.80 (04/12/2012)     Weekly max dose (mg)  Target end date    Indications    INR check location [facility]    Send INR reminders to    Comments             ANTICOAG TODAY: Anticoagulation Summary as of 04/12/2012              INR goal 2.0-3.0     Selected INR 2.80 (04/12/2012) Next INR check 05/17/2012   Weekly max dose (mg)  Target end date    Indications     Anticoagulation Episode Summary              INR check location [facility] lab    Send INR reminders to    Comments             PATIENT INSTRUCTIONS: Patient Instructions  Patient instructed to take medications as defined in the Anti-coagulation Track section of this encounter.  Patient instructed to take today's dose.  Patient verbalized understanding of these instructions.        FOLLOW-UP Return in 5 weeks (on 05/17/2012) for Follow up INR at 1115h.  Hulen Luster, III Pharm.D., CACP

## 2012-04-12 NOTE — Assessment & Plan Note (Signed)
Not well-controlled, repeat BP was 143/89.   -Continue Lisinopril 40mg  qd -Increase Lasix to 40mg  po qd -Check BMP today and on 9/26 -Follow up in 4-8 weeks

## 2012-04-12 NOTE — Patient Instructions (Signed)
Patient instructed to take medications as defined in the Anti-coagulation Track section of this encounter.  Patient instructed to take today's dose.  Patient verbalized understanding of these instructions.    

## 2012-04-12 NOTE — Assessment & Plan Note (Signed)
She's not having any significant shortness of breath. No change in therapy. 

## 2012-04-12 NOTE — Assessment & Plan Note (Signed)
Blood pressure is elevated today at 150/100. She is scheduled to see her primary physician later this afternoon. She will have her pressure checked again there and she will talk with her primary doctor about any change in therapy.

## 2012-04-12 NOTE — Progress Notes (Signed)
   HPI  Patient is seen to followup shortness of breath. I saw Breanna Wilkins last September, 2012. She's not having any marked shortness of breath. It is noted today that Breanna Wilkins blood pressure is significantly higher than it was last year.  Allergies  Allergen Reactions  . Aspirin     REACTION: Nausea (325mg )    Current Outpatient Prescriptions  Medication Sig Dispense Refill  . furosemide (LASIX) 20 MG tablet Take 1 tablet (20 mg total) by mouth daily.  31 tablet  3  . lisinopril (PRINIVIL,ZESTRIL) 40 MG tablet Take 20 mg by mouth daily.      Marland Kitchen warfarin (COUMADIN) 10 MG tablet Take as directed by anticoagulation clinic provider.  30 tablet  3  . DISCONTD: lisinopril (PRINIVIL,ZESTRIL) 40 MG tablet Take 1 tablet (40 mg total) by mouth daily.  31 tablet  3    History   Social History  . Marital Status: Single    Spouse Name: N/A    Number of Children: N/A  . Years of Education: N/A   Occupational History  . Not on file.   Social History Main Topics  . Smoking status: Former Games developer  . Smokeless tobacco: Not on file  . Alcohol Use: 3.0 oz/week    6 drink(s) per week     6 pack of beer  . Drug Use: Yes    Special: Marijuana     smoked joint 2 weeks ago  . Sexually Active: Not on file   Other Topics Concern  . Not on file   Social History Narrative   Financial assistance approved for 100% discount at [facility] and has [facility] card per [place]6/01/2010    Family History  Problem Relation Age of Onset  . Diabetes Father   . Diabetes Sister   . Diabetes Mother   . Deep vein thrombosis Sister   . Ovarian cancer Mother   . Bone cancer Maternal Grandmother     Past Medical History  Diagnosis Date  . Shoulder pain   . Pulmonary embolus 01/16/10    on Coumadin indefinitely  . OSA (obstructive sleep apnea)   . Abdominal hernia   . Hypertension   . Overweight   . Lung nodule 12/2009    Very small, left upper lobe by CT June, 2011, one-year followup was stable  . Shortness of breath    . Warfarin anticoagulation   . Ejection fraction     65-70%, vigorous function, echo, March, 2011    Past Surgical History  Procedure Date  . Abdominal hysterectomy 07/2002    laparoscopic assisted vaginal hysterectomy for menorrhagia, dysmenorrhea, anemia, fibroids    ROS   Patient denies fever, chills, headache, sweats, rash, change in vision, change in hearing, chest pain, cough, nausea vomiting, urinary symptoms. All other systems are reviewed and are negative.  PHYSICAL EXAM   Patient is overweight. She says that she is exercising regularly. She is oriented to person time and place. Affect is normal. Lungs are clear. Respiratory effort is nonlabored. Cardiac exam reveals S1 and S2. There no clicks or significant murmurs. The abdomen is protuberant but soft. There is no peripheral edema.  Filed Vitals:   04/12/12 1143  BP: 152/102  Pulse: 94  Height: 5\' 7"  (1.702 m)  Weight: 308 lb 6.4 oz (139.889 kg)  SpO2: 90%   EKG is done today and reviewed by me. There are nonspecific ST-T wave changes. No change from the past.  ASSESSMENT & PLAN

## 2012-04-12 NOTE — Patient Instructions (Addendum)
**Note De-identified Joriel Streety Obfuscation** Your physician recommends that you continue on your current medications as directed. Please refer to the Current Medication list given to you today.  Your physician wants you to follow-up in: 1 year. You will receive a reminder letter in the mail two months in advance. If you don't receive a letter, please call our office to schedule the follow-up appointment.  

## 2012-04-12 NOTE — Assessment & Plan Note (Signed)
BMI is 48, she has not lost any weight since I last saw her.  Patient is frustrated and depressed because she works out at [employer] but her weight remains the same at 308 pounds.  She really wants to start on oral med for her weightloss however, she does not have insurance at this time and will not be able to afford it out of pocket.  I still believe diet and exercise is the best method for weight loss even though it will be a slow process.  Anti-depressant, Wellbutrin is another option which is a good option for her since it will help with weightloss and depression; but again, she will not be able to afford it at this time.   -Will continue diet and exercise -Will try to get authorization from medicare in December for either Orlistat or Wellbutrin

## 2012-04-12 NOTE — Progress Notes (Signed)
HPI: [name] is a 51 yo woman with PMH of recurrent DVTs while on coumadin, HTN, OSA presents today for weight loss option.  She states that she has been trying to loose weight in the past several months without success. She works out about 1-2 hours per day and is eating smaller meals (5-6 small meals) per day, her appetite is not good either.  She is very depressed and wants to loose weight and want to start a pill to help.  She currently does not have insurance and will have medicare starting December.  As for her BP, she has been taking Lasix 20mg  and Lisinopril 40mg  and at the cardiologist's office, her BP was 152/102.  Her SOB is about the same, just saw Dr. Myrtis Ser today.    ROS: as per HPI  PE: General: alert, well-developed, and cooperative to examination.   Lungs: normal respiratory effort, no accessory muscle use, normal breath sounds, no crackles, and no wheezes. Heart: normal rate, regular rhythm, no murmur, no gallop, and no rub.  Abdomen: soft, non-tender, normal bowel sounds, no distention, no guarding, no rebound tenderness Pulses: 2+ DP/PT pulses bilaterally Extremities: No cyanosis, clubbing, +2 pitting edema up to knee level Neurologic: alert & oriented X3, cranial nerves II-XII intact, strength normal in all extremities, sensation intact to light touch, and gait normal.  Skin: turgor normal and no rashes.  Psych: Oriented X3, memory intact for recent and remote, normally interactive, good eye contact, + anxious appearing, and+ depressed appearing.

## 2012-04-13 LAB — BASIC METABOLIC PANEL WITH GFR
Calcium: 9.6 mg/dL (ref 8.4–10.5)
Creat: 0.7 mg/dL (ref 0.50–1.10)
GFR, Est African American: >89 mL/min
Glucose, Bld: 85 mg/dL (ref 70–99)
Sodium: 137 mEq/L (ref 135–145)

## 2012-04-15 ENCOUNTER — Other Ambulatory Visit: Payer: Self-pay | Admitting: Internal Medicine

## 2012-04-15 ENCOUNTER — Other Ambulatory Visit (INDEPENDENT_AMBULATORY_CARE_PROVIDER_SITE_OTHER): Payer: Self-pay

## 2012-04-15 DIAGNOSIS — I10 Essential (primary) hypertension: Secondary | ICD-10-CM

## 2012-04-15 DIAGNOSIS — R600 Localized edema: Secondary | ICD-10-CM

## 2012-04-15 DIAGNOSIS — R739 Hyperglycemia, unspecified: Secondary | ICD-10-CM

## 2012-04-15 DIAGNOSIS — R0602 Shortness of breath: Secondary | ICD-10-CM

## 2012-04-15 LAB — BASIC METABOLIC PANEL WITH GFR
Calcium: 10.2 mg/dL (ref 8.4–10.5)
Creat: 0.9 mg/dL (ref 0.50–1.10)
GFR, Est African American: 85 mL/min
Glucose, Bld: 130 mg/dL — ABNORMAL HIGH (ref 70–99)
Sodium: 141 mEq/L (ref 135–145)

## 2012-04-15 MED ORDER — POTASSIUM CHLORIDE ER 10 MEQ PO TBCR: 40.0000 meq | EXTENDED_RELEASE_TABLET | Freq: Every day | ORAL | Status: DC

## 2012-04-15 MED ORDER — FUROSEMIDE 40 MG PO TABS: 40.0000 mg | ORAL_TABLET | Freq: Every day | ORAL | Status: DC

## 2012-04-28 ENCOUNTER — Emergency Department (HOSPITAL_COMMUNITY)
Admission: EM | Admit: 2012-04-28 | Discharge: 2012-04-28 | Disposition: A | Discharge status: Home / Self Care | Payer: Self-pay | Attending: Emergency Medicine | Admitting: Emergency Medicine

## 2012-04-28 ENCOUNTER — Encounter (HOSPITAL_COMMUNITY): Payer: Self-pay | Admitting: Emergency Medicine

## 2012-04-28 DIAGNOSIS — I82409 Acute embolism and thrombosis of unspecified deep veins of unspecified lower extremity: Secondary | ICD-10-CM | POA: Insufficient documentation

## 2012-04-28 DIAGNOSIS — Z86718 Personal history of other venous thrombosis and embolism: Secondary | ICD-10-CM | POA: Insufficient documentation

## 2012-04-28 DIAGNOSIS — M79609 Pain in unspecified limb: Secondary | ICD-10-CM | POA: Insufficient documentation

## 2012-04-28 DIAGNOSIS — Z7901 Long term (current) use of anticoagulants: Secondary | ICD-10-CM | POA: Insufficient documentation

## 2012-04-28 DIAGNOSIS — I10 Essential (primary) hypertension: Secondary | ICD-10-CM | POA: Insufficient documentation

## 2012-04-28 DIAGNOSIS — M7989 Other specified soft tissue disorders: Secondary | ICD-10-CM | POA: Insufficient documentation

## 2012-04-28 LAB — BASIC METABOLIC PANEL
BUN: 14 mg/dL (ref 6–23)
GFR calc Af Amer: >90 mL/min (ref >90)
GFR calc non Af Amer: >90 mL/min (ref >90)
Potassium: 3.7 mEq/L (ref 3.5–5.1)

## 2012-04-28 LAB — CBC WITH DIFFERENTIAL/PLATELET
Basophils Relative: 0 % (ref 0–1)
Eosinophils Absolute: 0.1 10*3/uL (ref 0.0–0.7)
Hemoglobin: 13.3 g/dL (ref 12.0–15.0)
MCH: 27.7 pg (ref 26.0–34.0)
MCHC: 32.4 g/dL (ref 30.0–36.0)
Monocytes Relative: 8 % (ref 3–12)
Neutrophils Relative %: 67 % (ref 43–77)
Platelets: 304 10*3/uL (ref 150–400)

## 2012-04-28 LAB — PROTIME-INR: Prothrombin Time: 16.7 seconds — ABNORMAL HIGH (ref 11.6–15.2)

## 2012-04-28 MED ORDER — HYDROCODONE-ACETAMINOPHEN 5-500 MG PO TABS: 1.0000 | ORAL_TABLET | Freq: Four times a day (QID) | ORAL | Status: DC | PRN

## 2012-04-28 MED ORDER — FENTANYL CITRATE 0.05 MG/ML IJ SOLN
100.0000 ug | Freq: Once | INTRAMUSCULAR | Status: AC
  Administered 2012-04-28: 100 ug via INTRAMUSCULAR
  Filled 2012-04-28: qty 2

## 2012-04-28 MED ORDER — ONDANSETRON 4 MG PO TBDP
4.0000 mg | ORAL_TABLET | Freq: Once | ORAL | Status: AC
  Administered 2012-04-28: 4 mg via ORAL
  Filled 2012-04-28: qty 1

## 2012-04-28 NOTE — ED Provider Notes (Signed)
CDU note: Patient presented to the emergency department with left calf pain. Patient has history of DVTs in the past and is on Coumadin. She states this is been going on for approximately 3 days and radiating from the calf area down the left lower extremity. The patient's basic metabolic panel is well within normal limits. The pro time was 16.7, INR was 1.39. It is of note that the patient was therapeutic approximately one month ago according to the patient. The patient had no shortness of breath or chest pain.  The patient was seen by the pharmacy team and it was felt that the patient would be best served by increasing the Coumadin to 10 mg for today and tomorrow October 10. The patient is to use 5 mg on Saturday, somewhat Sunday the 13th, and Monday. The patient will be rechecked at that time by her primary physician. The patient acknowledges understanding of the instructions and the labs that were performed. It is safe for the patient to be discharged home. Prescription for Vicodin 5 mg #15 tablets given to the patient for her discomfort.  Kathie Dike, [place] 04/28/12 [phone]

## 2012-04-28 NOTE — Progress Notes (Signed)
*  Preliminary Results* Left lower extremity venous duplex completed. There is evidence of left lower extremity deep vein thrombosis involving the left mid to distal femoral vein.This area of thrombosis does not appear to be acute.  There is no evidence of right common femoral vein thrombosis or left Baker's cyst. Attempted to call in preliminary results, no answer in CDU.  04/28/2012 1:59 PM Gertie Fey, RDMS, RDCS

## 2012-04-28 NOTE — ED Notes (Signed)
Spoke with [employer] in pharmacy. She is coming to dose pt coumadin

## 2012-04-28 NOTE — ED Notes (Signed)
Onset 3 days ago left calf pain continued today with radiating pain now to left lower back.  Pain currently 10/10 cramping pain.  Hx of DVT left lower extremity and is on coumadin currently.

## 2012-04-28 NOTE — ED Notes (Signed)
Pt has arrived in cdu from the lobby

## 2012-04-28 NOTE — ED Notes (Addendum)
Pt complains of pain behind her left knee since Sunday. States she was going into a pew and felt like a "cramp" in the back of her leg that has not let up since. States also awoke with pain in her mid lower back Monday morning. Pain increases with movement. Onset when she awoke and was trying to get oob. Denies injury. Denies cp or sob. Lungs equal and clear bilaterally. No swelling appreciated to either leg. Pt is on coumadin long term due to pe and dvt history.

## 2012-04-28 NOTE — ED Provider Notes (Signed)
Medical screening examination/treatment/procedure(s) were performed by non-physician practitioner and as supervising physician I was immediately available for consultation/collaboration.  Flint Melter, MD 04/28/12 2014

## 2012-04-28 NOTE — ED Provider Notes (Signed)
History     CSN: [id number]  Arrival date & time 04/28/12  0941   First MD Initiated Contact with Patient 04/28/12 1019      Chief Complaint  Patient presents with  . Back Pain  . Leg Pain    (Consider location/radiation/quality/duration/timing/severity/associated sxs/prior treatment) HPI  Pt to the ER with complaints of left calf pain. She has a hx of DVTs and takes coumadin. She is concerned because the pain has been steadily increasing but she is already on medication for blood clots. She ssays that she gets her PT/INR's checked every month and has been taking her medications as prescribed to her.  She denies any shortness of breath, chest pains, N/V/D. VSS/NAD  Past Medical History  Diagnosis Date  . Shoulder pain   . Pulmonary embolus 01/16/10    on Coumadin indefinitely  . OSA (obstructive sleep apnea)   . Abdominal hernia   . Hypertension   . Overweight   . Lung nodule 12/2009    Very small, left upper lobe by CT June, 2011, one-year followup was stable  . Shortness of breath   . Warfarin anticoagulation   . Ejection fraction     65-70%, vigorous function, echo, March, 2011    Past Surgical History  Procedure Date  . Abdominal hysterectomy 07/2002    laparoscopic assisted vaginal hysterectomy for menorrhagia, dysmenorrhea, anemia, fibroids    Family History  Problem Relation Age of Onset  . Diabetes Father   . Diabetes Sister   . Diabetes Mother   . Deep vein thrombosis Sister   . Ovarian cancer Mother   . Bone cancer Maternal Grandmother     History  Substance Use Topics  . Smoking status: Former Games developer  . Smokeless tobacco: Not on file  . Alcohol Use: No     6 pack of beer    OB History    Grav Para Term Preterm Abortions TAB SAB Ect Mult Living                  Review of Systems   Review of Systems  Gen: no weight loss, fevers, chills, night sweats  Eyes: no discharge or drainage, no occular pain or visual changes  Nose: no epistaxis or  rhinorrhea  Mouth: no dental pain, no sore throat  Neck: no neck pain  Lungs:No wheezing, coughing or hemoptysis CV: no chest pain, palpitations, dependent edema or orthopnea  Abd: no abdominal pain, nausea, vomiting  GU: no dysuria or gross hematuria  MSK:  Left calf pain  Neuro: no headache, no focal neurologic deficits  Skin: no abnormalities Psyche: negative.    Allergies  Aspirin  Home Medications   Current Outpatient Rx  Name Route Sig Dispense Refill  . FUROSEMIDE 20 MG PO TABS Oral Take 40 mg by mouth daily.    Marland Kitchen LISINOPRIL 20 MG PO TABS Oral Take 40 mg by mouth daily.    . OXYCODONE-ACETAMINOPHEN 5-325 MG PO TABS Oral Take 0.5-1 tablets by mouth every 4 (four) hours as needed. For pain    . POTASSIUM CHLORIDE ER 10 MEQ PO TBCR Oral Take 4 tablets (40 mEq total) by mouth daily. 30 tablet 1  . WARFARIN SODIUM 10 MG PO TABS Oral Take 5-10 mg by mouth daily. Take 0.5 tablet daily, except on Wednesday takes 1 tablet    . HYDROCODONE-ACETAMINOPHEN 5-500 MG PO TABS Oral Take 1-2 tablets by mouth every 6 (six) hours as needed for pain. 15 tablet 0  .  HYDROCODONE-ACETAMINOPHEN 5-500 MG PO TABS Oral Take 1-2 tablets by mouth every 6 (six) hours as needed for pain. 15 tablet 0    BP 144/81  Pulse 63  Temp 98.1 F (36.7 C) (Oral)  Resp 20  SpO2 100%  Physical Exam  Nursing note and vitals reviewed. Constitutional: She appears well-developed and well-nourished. No distress.  HENT:  Head: Normocephalic and atraumatic.  Eyes: Pupils are equal, round, and reactive to light.  Neck: Normal range of motion. Neck supple.  Cardiovascular: Normal rate and regular rhythm.   Pulmonary/Chest: Effort normal.  Abdominal: Soft.  Musculoskeletal:       Left lower leg: She exhibits tenderness and swelling. She exhibits no bony tenderness, no edema, no deformity and no laceration.       Legs: Neurological: She is alert.  Skin: Skin is warm and dry.    ED Course  Procedures (including  critical care time)  Labs Reviewed  PROTIME-INR - Abnormal; Notable for the following:    Prothrombin Time 16.7 (*)     All other components within normal limits  CBC WITH DIFFERENTIAL - Abnormal; Notable for the following:    RDW 15.7 (*)     All other components within normal limits  BASIC METABOLIC PANEL  LAB REPORT - SCANNED   No results found.   1. DVT (deep venous thrombosis)       MDM  Pt still has DVT in left leg. None acute DVT. No SOB/CP.   INR is 1.39 and non therapeutic. I have consulted pharmacy to help me dose her medication to be therapeutic and pt needs to follow up with Dr. Anselm Jungling as soon as possible.  End of shift care handed over to Ivery Quale, PA-C oncoming CDU PA.        Dorthula Matas, PA 05/06/12 [phone]

## 2012-04-28 NOTE — ED Notes (Signed)
Lab and pharmacy into room immediately on her arrival . Staff could not get pt undressed

## 2012-05-03 ENCOUNTER — Ambulatory Visit (INDEPENDENT_AMBULATORY_CARE_PROVIDER_SITE_OTHER): Payer: Self-pay | Admitting: Pharmacist

## 2012-05-03 DIAGNOSIS — Z7901 Long term (current) use of anticoagulants: Secondary | ICD-10-CM

## 2012-05-03 DIAGNOSIS — I2699 Other pulmonary embolism without acute cor pulmonale: Secondary | ICD-10-CM

## 2012-05-03 NOTE — Progress Notes (Signed)
Anti-Coagulation Progress Note  Breanna Wilkins is a 51 y.o. female who is currently on an anti-coagulation regimen.    RECENT RESULTS: Recent results are below, the most recent result is correlated with a dose of 40 mg. per week: Lab Results  Component Value Date   INR 2.40 05/03/2012   INR 1.39 04/28/2012   INR 2.80 04/12/2012    ANTI-COAG DOSE:   Latest dosing instructions   Total [name] Tue Wed Thu Fri Sat   50 5 mg 10 mg 10 mg 10 mg 5 mg 5 mg 5 mg    (10 mg0.5) (10 mg1) (10 mg1) (10 mg1) (10 mg0.5) (10 mg0.5) (10 mg0.5)         ANTICOAG SUMMARY: Anticoagulation Episode Summary              Current INR goal 2.0-3.0 Next INR check 05/17/2012   INR from last check 2.40 (05/03/2012)     Weekly max dose (mg)  Target end date    Indications    [facility] check location [facility]    Send INR reminders to    Comments             ANTICOAG TODAY: Anticoagulation Summary as of 05/03/2012              INR goal 2.0-3.0     Selected INR 2.40 (05/03/2012) Next INR check 05/17/2012   Weekly max dose (mg)  Target end date    Indications     Anticoagulation Episode Summary              INR check location [facility]erred lab    Send INR reminders to    Comments             PATIENT INSTRUCTIONS: Patient Instructions  Patient instructed to take medications as defined in the Anti-coagulation Track section of this encounter.  Patient instructed to take today's dose.  Patient verbalized understanding of these instructions.        FOLLOW-UP Return in 2 weeks (on 05/17/2012) for Follow up INR at 0945h.  Hulen Luster, III Pharm.D., CACP

## 2012-05-03 NOTE — Patient Instructions (Signed)
Patient instructed to take medications as defined in the Anti-coagulation Track section of this encounter.  Patient instructed to take today's dose.  Patient verbalized understanding of these instructions.    

## 2012-05-04 NOTE — Progress Notes (Signed)
Agree 

## 2012-05-06 NOTE — ED Provider Notes (Signed)
Medical screening examination/treatment/procedure(s) were performed by non-physician practitioner and as supervising physician I was immediately available for consultation/collaboration.  Flint Melter, MD 05/06/12 [phone]

## 2012-05-17 ENCOUNTER — Ambulatory Visit (INDEPENDENT_AMBULATORY_CARE_PROVIDER_SITE_OTHER): Payer: Self-pay | Admitting: Pharmacist

## 2012-05-17 DIAGNOSIS — I2699 Other pulmonary embolism without acute cor pulmonale: Secondary | ICD-10-CM

## 2012-05-17 DIAGNOSIS — Z7901 Long term (current) use of anticoagulants: Secondary | ICD-10-CM

## 2012-05-17 NOTE — Progress Notes (Signed)
Anti-Coagulation Progress Note  Breanna Wilkins is a 51 y.o. female who is currently on an anti-coagulation regimen.    RECENT RESULTS: Recent results are below, the most recent result is correlated with a dose of 50 mg. per week: Lab Results  Component Value Date   INR 3.10 05/17/2012   INR 2.40 05/03/2012   INR 1.39 04/28/2012    ANTI-COAG DOSE:   Latest dosing instructions   Total [name] Tue Wed Thu Fri Sat   50 5 mg 10 mg 5 mg 10 mg 5 mg 10 mg 5 mg    (10 mg0.5) (10 mg1) (10 mg0.5) (10 mg1) (10 mg0.5) (10 mg1) (10 mg0.5)         ANTICOAG SUMMARY: Anticoagulation Episode Summary              Current INR goal 2.0-3.0 Next INR check 06/14/2012   INR from last check 3.10! (05/17/2012)     Weekly max dose (mg)  Target end date    Indications    INR check location [facility] lab    Send INR reminders to    Comments             ANTICOAG TODAY: Anticoagulation Summary as of 05/17/2012              INR goal 2.0-3.0     Selected INR 3.10! (05/17/2012) Next INR check 06/14/2012   Weekly max dose (mg)  Target end date    Indications     Anticoagulation Episode Summary              INR check location [facility]erred lab    Send INR reminders to    Comments             PATIENT INSTRUCTIONS: Patient Instructions  Patient instructed to take medications as defined in the Anti-coagulation Track section of this encounter.  Patient instructed to take today's dose.  Patient verbalized understanding of these instructions.        FOLLOW-UP Return in 4 weeks (on 06/14/2012) for Follow up INR at 1130h.  Hulen Luster, III Pharm.D., CACP

## 2012-05-17 NOTE — Progress Notes (Signed)
Dr Anselm Jungling, Your PL overview indicates that the pt should be on warfarin until 02/2012. Your next appt with pt isn;t until end of Nov. Are you comfortable leaving her on warfarin until then? This is not a discussion that an [facility] doctor can have. Also, she has an appt with Dr Allena Katz early in Nov - does she need that? Thanks!

## 2012-05-17 NOTE — Patient Instructions (Signed)
Patient instructed to take medications as defined in the Anti-coagulation Track section of this encounter.  Patient instructed to take today's dose.  Patient verbalized understanding of these instructions.    

## 2012-05-24 ENCOUNTER — Ambulatory Visit: Payer: Self-pay | Admitting: Internal Medicine

## 2012-05-26 NOTE — Addendum Note (Signed)
Addended by: Bufford Spikes on: 05/26/2012 11:43 AM   Modules accepted: Orders

## 2012-06-14 ENCOUNTER — Ambulatory Visit: Payer: Self-pay

## 2012-06-15 ENCOUNTER — Encounter: Payer: Self-pay | Admitting: Internal Medicine

## 2012-06-21 ENCOUNTER — Ambulatory Visit (INDEPENDENT_AMBULATORY_CARE_PROVIDER_SITE_OTHER): Payer: Medicare Other | Admitting: Pharmacist

## 2012-06-21 DIAGNOSIS — I2699 Other pulmonary embolism without acute cor pulmonale: Secondary | ICD-10-CM

## 2012-06-21 DIAGNOSIS — Z7901 Long term (current) use of anticoagulants: Secondary | ICD-10-CM

## 2012-06-21 LAB — POCT INR: INR: 3.3

## 2012-06-21 NOTE — Patient Instructions (Signed)
Patient instructed to take medications as defined in the Anti-coagulation Track section of this encounter.  Patient instructed to take today's dose.  Patient verbalized understanding of these instructions.    

## 2012-06-21 NOTE — Progress Notes (Signed)
Anti-Coagulation Progress Note  Breanna Wilkins is a 51 y.o. female who is currently on an anti-coagulation regimen.    RECENT RESULTS: Recent results are below, the most recent result is correlated with a dose of 50 mg. per week: Lab Results  Component Value Date   INR 3.3 06/21/2012   INR 3.10 05/17/2012   INR 2.40 05/03/2012    ANTI-COAG DOSE:   Latest dosing instructions   Total [name] Tue Wed Thu Fri Sat   45 5 mg 5 mg 5 mg 10 mg 5 mg 10 mg 5 mg    (10 mg0.5) (10 mg0.5) (10 mg0.5) (10 mg1) (10 mg0.5) (10 mg1) (10 mg0.5)         ANTICOAG SUMMARY: Anticoagulation Episode Summary              Current INR goal 2.0-3.0 Next INR check 07/12/2012   INR from last check 3.3! (06/21/2012)     Weekly max dose (mg)  Target end date    Indications    INR check location [facility] Preferred lab    Send INR reminders to    Comments             ANTICOAG TODAY: Anticoagulation Summary as of 06/21/2012              INR goal 2.0-3.0     Selected INR 3.3! (06/21/2012) Next INR check 07/12/2012   Weekly max dose (mg)  Target end date    Indications     Anticoagulation Episode Summary              INR check location [facility] Preferred lab    Send INR reminders to    Comments             PATIENT INSTRUCTIONS: Patient Instructions  Patient instructed to take medications as defined in the Anti-coagulation Track section of this encounter.  Patient instructed to take today's dose.  Patient verbalized understanding of these instructions.        FOLLOW-UP Return in 3 weeks (on 07/12/2012) for Follow up INR at 1030h.  Hulen Luster, III Pharm.D., CACP

## 2012-06-22 NOTE — Progress Notes (Signed)
Agree with Dr. Groce's plan. 

## 2012-06-24 ENCOUNTER — Other Ambulatory Visit: Payer: Self-pay | Admitting: Internal Medicine

## 2012-07-05 ENCOUNTER — Other Ambulatory Visit: Payer: Self-pay | Admitting: *Deleted

## 2012-07-05 MED ORDER — LISINOPRIL 40 MG PO TABS: 40.0000 mg | ORAL_TABLET | Freq: Every day | ORAL | Status: DC

## 2012-07-12 ENCOUNTER — Ambulatory Visit (INDEPENDENT_AMBULATORY_CARE_PROVIDER_SITE_OTHER): Payer: Medicare Other | Admitting: Pharmacist

## 2012-07-12 DIAGNOSIS — I2699 Other pulmonary embolism without acute cor pulmonale: Secondary | ICD-10-CM

## 2012-07-12 DIAGNOSIS — Z7901 Long term (current) use of anticoagulants: Secondary | ICD-10-CM

## 2012-07-12 NOTE — Progress Notes (Signed)
Patient was seen in ED 04/28/12 with symptomatic left lower extremity DVT and subtherapeutic INR.

## 2012-07-12 NOTE — Progress Notes (Signed)
Anti-Coagulation Progress Note  Breanna Wilkins is a 51 y.o. female who is currently on an anti-coagulation regimen.    RECENT RESULTS: Recent results are below, the most recent result is correlated with a dose of 45 mg. per week: Lab Results  Component Value Date   INR 3.5 07/12/2012   INR 3.3 06/21/2012   INR 3.10 05/17/2012    ANTI-COAG DOSE:   Latest dosing instructions   Total [name] Tue Wed Thu Fri Sat   40 5 mg 5 mg 5 mg 10 mg 5 mg 5 mg 5 mg    (10 mg0.5) (10 mg0.5) (10 mg0.5) (10 mg1) (10 mg0.5) (10 mg0.5) (10 mg0.5)         ANTICOAG SUMMARY: Anticoagulation Episode Summary              Current INR goal 2.0-3.0 Next INR check 08/16/2012   INR from last check 3.5! (07/12/2012)     Weekly max dose (mg)  Target end date    Indications    IN[facility] check [facility]    Send INR reminders to    Comments             ANTICOAG TODAY: Anticoagulation Summary as of 07/12/2012              INR goal 2.0-3.0     Selected INR 3.5! (07/12/2012) Next INR check 08/16/2012   Weekly max dose (mg)  Target end date    Indications     Anticoagulation Episode Summary              INR check location [facility] Preferred lab    Send INR reminders to    Comments             PATIENT INSTRUCTIONS: Patient Instructions  Patient instructed to take medications as defined in the Anti-coagulation Track section of this encounter.  Patient instructed to take today's dose.  Patient verbalized understanding of these instructions.        FOLLOW-UP Return in 5 weeks (on 08/16/2012) for Follow up INR at 1015h.  Hulen Luster, III Pharm.D., CACP

## 2012-07-12 NOTE — Patient Instructions (Signed)
Patient instructed to take medications as defined in the Anti-coagulation Track section of this encounter.  Patient instructed to take today's dose.  Patient verbalized understanding of these instructions.    

## 2012-08-16 ENCOUNTER — Ambulatory Visit (INDEPENDENT_AMBULATORY_CARE_PROVIDER_SITE_OTHER): Payer: Medicare Other | Admitting: Pharmacist

## 2012-08-16 DIAGNOSIS — Z7901 Long term (current) use of anticoagulants: Secondary | ICD-10-CM

## 2012-08-16 NOTE — Progress Notes (Signed)
Anti-Coagulation Progress Note  Breanna Wilkins is a 52 y.o. female who is currently on an anti-coagulation regimen.    RECENT RESULTS: Recent results are below, the most recent result is correlated with a dose of 40 mg. per week: Lab Results  Component Value Date   INR 2.80 08/16/2012   INR 3.5 07/12/2012   INR 3.3 06/21/2012    ANTI-COAG DOSE:   Latest dosing instructions   Total [name] Tue Wed Thu Fri Sat   40 5 mg 5 mg 5 mg 10 mg 5 mg 5 mg 5 mg    (10 mg0.5) (10 mg0.5) (10 mg0.5) (10 mg1) (10 mg0.5) (10 mg0.5) (10 mg0.5)         ANTICOAG SUMMARY: Anticoagulation Episode Summary              Current INR goal 2.0-3.0 Next INR check 09/13/2012   INR from last check 2.80 (08/16/2012)     Weekly max dose (mg)  Target end date    Indications    INR check location [facility] lab    Send INR reminders to    Comments             ANTICOAG TODAY: Anticoagulation Summary as of 08/16/2012              INR goal 2.0-3.0     Selected INR 2.80 (08/16/2012) Next INR check 09/13/2012   Weekly max dose (mg)  Target end date    Indications     Anticoagulation Episode Summary              INR check location [facility]erred lab    Send INR reminders to    Comments             PATIENT INSTRUCTIONS: Patient Instructions  Patient instructed to take medications as defined in the Anti-coagulation Track section of this encounter.  Patient instructed to take today's dose.  Patient verbalized understanding of these instructions.        FOLLOW-UP Return in 4 weeks (on 09/13/2012) for Follow up INR at 1015h.  Hulen Luster, III Pharm.D., CACP

## 2012-08-16 NOTE — Patient Instructions (Signed)
Patient instructed to take medications as defined in the Anti-coagulation Track section of this encounter.  Patient instructed to take today's dose.  Patient verbalized understanding of these instructions.    

## 2012-08-16 NOTE — Progress Notes (Signed)
Dr Christie Nottingham PL listed stop date as 02/2012. Sent message to review indications and stop date.

## 2012-08-16 NOTE — Progress Notes (Signed)
She has not come back to discuss with me about her anticoagulation.  I will ask her to come back in Feb when I'm in clinic to discuss.

## 2012-08-31 ENCOUNTER — Ambulatory Visit (INDEPENDENT_AMBULATORY_CARE_PROVIDER_SITE_OTHER): Payer: Medicare Other | Admitting: Internal Medicine

## 2012-08-31 VITALS — BP 120/81 | HR 90 | Temp 96.8°F | Wt 303.0 lb

## 2012-08-31 DIAGNOSIS — M75100 Unspecified rotator cuff tear or rupture of unspecified shoulder, not specified as traumatic: Secondary | ICD-10-CM

## 2012-08-31 DIAGNOSIS — I10 Essential (primary) hypertension: Secondary | ICD-10-CM

## 2012-08-31 DIAGNOSIS — S46819A Strain of other muscles, fascia and tendons at shoulder and upper arm level, unspecified arm, initial encounter: Secondary | ICD-10-CM

## 2012-08-31 DIAGNOSIS — I82439 Acute embolism and thrombosis of unspecified popliteal vein: Secondary | ICD-10-CM

## 2012-08-31 DIAGNOSIS — K429 Umbilical hernia without obstruction or gangrene: Secondary | ICD-10-CM

## 2012-08-31 DIAGNOSIS — E785 Hyperlipidemia, unspecified: Secondary | ICD-10-CM

## 2012-08-31 DIAGNOSIS — I2699 Other pulmonary embolism without acute cor pulmonale: Secondary | ICD-10-CM

## 2012-08-31 DIAGNOSIS — I824Y9 Acute embolism and thrombosis of unspecified deep veins of unspecified proximal lower extremity: Secondary | ICD-10-CM

## 2012-08-31 DIAGNOSIS — Z7901 Long term (current) use of anticoagulants: Secondary | ICD-10-CM

## 2012-08-31 LAB — BASIC METABOLIC PANEL WITH GFR
BUN: 13 mg/dL (ref 6–23)
CO2: 29 mEq/L (ref 19–32)
Chloride: 103 mEq/L (ref 96–112)
GFR, Est African American: >89 mL/min
Glucose, Bld: 100 mg/dL — ABNORMAL HIGH (ref 70–99)
Potassium: 3.8 mEq/L (ref 3.5–5.3)

## 2012-08-31 MED ORDER — HYDROCODONE-ACETAMINOPHEN 5-325 MG PO TABS: 1.0000 | ORAL_TABLET | Freq: Four times a day (QID) | ORAL | Status: DC | PRN

## 2012-08-31 MED ORDER — PRAVASTATIN SODIUM 40 MG PO TABS: 40.0000 mg | ORAL_TABLET | Freq: Every evening | ORAL | Status: DC

## 2012-08-31 NOTE — Assessment & Plan Note (Signed)
Chronic shoulder pain.  MRI in 11/2010 showed full thickness supraspinatus tendon tear.  Tried PT and sport medicine with steroid injection without relief. -Will prescribe short term hydrocodone 5/325mg  PRN #[id number] -WIll need to refer her to DR. Landau or Dr. Simonne Come for further eval and treatment

## 2012-08-31 NOTE — Progress Notes (Signed)
Patient ID: Breanna Wilkins, female   DOB: Jul 11, 1961, 52 y.o.   MRN: [id number] HPI: Breanna Wilkins is a 52 yo W who is well known to me with PMH of recurrent DVTs, HTN, HLP presents today for routine follow up.   Right shoulder pain x several years- tried ibuprofen but not working. She also was seen by sport medicine, tried PT without much relief.  Seen by ortho before for her knees- Dr. Simonne Come. Had MRI of right shoulder on 11/2010 which showed "full-thickness retracted supraspinatus tendon tear and significant surrounding rotator cuff tendinopathy.  AC joint degenerative changes, lateral downsloping of the acromion and type 3 shape may contribute to bony impingement. Significant long head biceps tendinopathy at risk for tearing given its appearance." She states that Hydrocodone worked well for her in the past and she usually takes 1/2 tablet to take the pain away.  Also has umbilical hernia that is irritated sometimes when her dog stays on it. As for her Warfarin: she would like to stay on it indefinitely She declined flu vaccine today.  ROS: as per HPI  PE: General: alert, well-developed, and cooperative to examination.  Lungs: normal respiratory effort, no accessory muscle use, normal breath sounds, no crackles, and no wheezes. Heart: normal rate, regular rhythm, no murmur, no gallop, and no rub.  Abdomen: soft, non-tender, normal bowel sounds, no distention, no guarding, no rebound tenderness. 4x4cm umbilical hernia not strangulated or incarcerated Neurologic: nonfocal EXT: right shoulder, mild tenderness with passive and active ROM.  Unable to perform Apley's back scratch Psych: appropriate

## 2012-08-31 NOTE — Assessment & Plan Note (Signed)
LDL was 195 in 01/2012.  Tried diet and exercise. She lost 5 pounds since sept.   -Will start Pravastatin 40mg  qhs -Repeat lipid panel in 3 months

## 2012-08-31 NOTE — Assessment & Plan Note (Signed)
Well controlled, will continue Lasix 20mg  and Lisinopril 40mg  qd

## 2012-08-31 NOTE — Assessment & Plan Note (Signed)
We discussed risks and benefits of coumadin and patient wishes to stay on it indefinitely at this time.

## 2012-08-31 NOTE — Patient Instructions (Addendum)
Start taking Pravastatin 40mg  one tablet at bed time for your cholesterol Take Hydrocodone 5mg  1/2 to 1 tablet as needed for shoulder pain Will refer you to general surgery for hernia repair Follow up with Dr. Anselm Jungling in June

## 2012-09-06 ENCOUNTER — Ambulatory Visit (INDEPENDENT_AMBULATORY_CARE_PROVIDER_SITE_OTHER): Payer: Medicare Other | Admitting: General Surgery

## 2012-09-06 ENCOUNTER — Encounter (INDEPENDENT_AMBULATORY_CARE_PROVIDER_SITE_OTHER): Payer: Self-pay | Admitting: General Surgery

## 2012-09-06 VITALS — BP 148/98 | HR 72 | Temp 97.6°F | Resp 18 | Ht 67.0 in | Wt 303.2 lb

## 2012-09-06 DIAGNOSIS — K436 Other and unspecified ventral hernia with obstruction, without gangrene: Secondary | ICD-10-CM

## 2012-09-06 DIAGNOSIS — K430 Incisional hernia with obstruction, without gangrene: Secondary | ICD-10-CM

## 2012-09-06 NOTE — Progress Notes (Signed)
Patient ID: Breanna Wilkins, female   DOB: August 21, 1960, 52 y.o.   MRN: [id number]  No chief complaint on file.   HPI Breanna Wilkins is a 52 y.o. female.  She is referred by Dr. Marcelino [facility]. Dr. Arnaldo Natal manages her Coumadin.  The patient is here to talk to me about a symptomatic incisional hernia at the umbilicus. She had a laparoscopic hysterectomy in the past. She's noticed a hernia at her umbilicus for 6 years. It is getting bigger and now hurts to touch. She had a CT scan in April 2013 at which time she had a gastroenteritis and the hernia was noted as well  and there was fat present within the hernia sac. Currently she has no GI symptoms, just more pain when she touches the hernia.  Past history is significant for morbid obesity, obstructive sleep apnea, hypertension, the laparoscopic hysterectomy. She has been on Coumadin for 2 years for a popliteal DVT and pulmonary embolism. There is no current plan to take her off of this. Her Coumadin is regulated by Dr. Aundria Rud in the [facility] internal medicine clinic.  HPI  Past Medical History  Diagnosis Date  . Shoulder pain   . Pulmonary embolus 01/16/10    on Coumadin indefinitely  . OSA (obstructive sleep apnea)   . Abdominal hernia   . Hypertension   . Overweight   . Lung nodule 12/2009    Very small, left upper lobe by CT June, 2011, one-year followup was stable  . Shortness of breath   . Warfarin anticoagulation   . Ejection fraction     65-70%, vigorous function, echo, March, 2011    Past Surgical History  Procedure Laterality Date  . Abdominal hysterectomy  07/2002    laparoscopic assisted vaginal hysterectomy for menorrhagia, dysmenorrhea, anemia, fibroids    Family History  Problem Relation Age of Onset  . Diabetes Father   . Diabetes Sister   . Diabetes Mother   . Deep vein thrombosis Sister   . Ovarian cancer Mother   . Bone cancer Maternal Grandmother     Social  History History  Substance Use Topics  . Smoking status: Former Games developer  . Smokeless tobacco: Not on file  . Alcohol Use: No     Comment: 6 pack of beer    Allergies  Allergen Reactions  . Aspirin     REACTION: Nausea (325mg )    Current Outpatient Prescriptions  Medication Sig Dispense Refill  . furosemide (LASIX) 20 MG tablet Take 40 mg by mouth daily.      Marland Kitchen HYDROcodone-acetaminophen (NORCO/VICODIN) 5-325 MG per tablet Take 1 tablet by mouth every 6 (six) hours as needed for pain.  30 tablet  0  . lisinopril (PRINIVIL,ZESTRIL) 40 MG tablet Take 1 tablet (40 mg total) by mouth daily.  31 tablet  6  . pravastatin (PRAVACHOL) 40 MG tablet Take 1 tablet (40 mg total) by mouth every evening.  30 tablet  11  . warfarin (COUMADIN) 10 MG tablet Take 5-10 mg by mouth daily. Take 0.5 tablet daily, except on Wednesday takes 1 tablet       No current facility-administered medications for this visit.    Review of Systems Review of Systems  Constitutional: Negative for fever, chills and unexpected weight change.  HENT: Negative for hearing loss, congestion, sore throat, trouble swallowing and voice change.   Eyes: Negative for visual disturbance.  Respiratory: Positive for choking and shortness of breath. Negative for  cough and wheezing.   Cardiovascular: Negative for chest pain, palpitations and leg swelling.  Gastrointestinal: Positive for abdominal pain. Negative for nausea, vomiting, diarrhea, constipation, blood in stool, abdominal distention and anal bleeding.  Genitourinary: Negative for hematuria, vaginal bleeding and difficulty urinating.  Musculoskeletal: Negative for arthralgias.  Skin: Negative for rash and wound.  Neurological: Negative for seizures, syncope and headaches.  Hematological: Negative for adenopathy. Does not bruise/bleed easily.  Psychiatric/Behavioral: Negative for confusion.    Blood pressure 148/98, pulse 72, temperature 97.6 F (36.4 C), temperature source  Temporal, resp. rate 18, height 5\' 7"  (1.702 m), weight 303 lb 3.2 oz (137.531 kg).  Physical Exam Physical Exam  Constitutional: She is oriented to person, place, and time. She appears well-developed and well-nourished. No distress.  Weight 303 pounds.  HENT:  Head: Normocephalic and atraumatic.  Nose: Nose normal.  Mouth/Throat: No oropharyngeal exudate.  Eyes: Conjunctivae and EOM are normal. Pupils are equal, round, and reactive to light. Left eye exhibits no discharge. No scleral icterus.  Neck: Neck supple. No JVD present. No tracheal deviation present. No thyromegaly present.  Cardiovascular: Normal rate, regular rhythm, normal heart sounds and intact distal pulses.   No murmur heard. Pulmonary/Chest: Effort normal and breath sounds normal. No respiratory distress. She has no wheezes. She has no rales. She exhibits no tenderness.  Abdominal: Soft. Bowel sounds are normal. She exhibits no distension and no mass. There is no tenderness. There is no rebound and no guarding.    Hernia at umbilicus and just below. Well-healed vertical incision below umbilicus. The hernia sac is at least 6 cm in diameter. Partially reducible. No skin inflammation. 2 small trocar sites in the suprapubic area.  Musculoskeletal: She exhibits no edema and no tenderness.  Lymphadenopathy:    She has no cervical adenopathy.  Neurological: She is alert and oriented to person, place, and time. She exhibits normal muscle tone. Coordination normal.  Skin: Skin is warm. No rash noted. She is not diaphoretic. No erythema. No pallor.  Psychiatric: She has a normal mood and affect. Her behavior is normal. Judgment and thought content normal.    Data Reviewed CT scan. [facility]  Assessment    Partially incarcerated periumbilical incisional hernia. Symptomatic.Theoretically this just contains fat at the present time. She is at risk for progression, incarceration, and  strangulation.    Morbid obesity   History pulmonary embolism, on chronic Coumadin  Obstructive sleep apnea  Hypertension  History laparoscopic TAH    Plan    I had a long talk with the patient about indications and details of hernia repair. She wants to have this repaired at this time a cause of progressive pain and progressive size. I think she is a reasonable candidate for a laparoscopic repair, although this may have to be converted to open.  We'll schedule for laparoscopic repair of her ventral incisional hernia with mesh, possible open repair at [facility] in the  near future.  I discussed the indications, details, techniques, and numerous risks of the surgery with her. She understands all these issues. All of her questions are answered. She agrees with this plan.  We will ask Dr. Arnaldo Natal for permission to take her off Coumadin for 5 days. I will defer to him as to whether he wants a Lovenox bridge. She will be restarted on her Coumadin in the immediate postop period        Nasiyah Laverdiere M. Derrell Lolling, M.D., [facility] General and Minimally invasive Surgery Breast and Colorectal Surgery Office:   [phone] Pager:   [phone]  09/06/2012, 3:49 PM

## 2012-09-06 NOTE — Patient Instructions (Signed)
You have an incisional hernia at the umbilicus, probably related to your previous surgery. There is some fatty tissue trapped in this.  You will be scheduled for a laparoscopic repair of your hernia with mesh, possible open repair.  You will need to be off of your Coumadin for 5-6 days prior to this surgery. We will coordinate this with her doctors at the [facility] internal medicine clinic.     Hernia Repair with Laparoscope A hernia occurs when an internal organ pushes out through a weak spot in the belly (abdominal) wall muscles. Hernias most commonly occur in the groin and around the navel. Hernias can also occur through a cut by the surgeon (incision) after an abdominal operation. A hernia may be caused by:  Lifting heavy objects.  Prolonged coughing.  Straining to move your bowels. Hernias can often be pushed back into place (reduced). Most hernias tend to get worse over time. Problems occur when abdominal contents get stuck in the opening and the blood supply is blocked or impaired (incarcerated hernia). Because of these risks, you require surgery to repair the hernia. Your hernia will be repaired using a laparoscope. Laparoscopic surgery is a type of minimally invasive surgery. It does not involve making a typical surgical cut (incision) in the skin. A laparoscope is a telescope-like rod and lens system. It is usually connected to a video camera and a light source so your caregiver can clearly see the operative area. The instruments are inserted through  to  inch (5 mm or 10 mm) openings in the skin at specific locations. A working and viewing space is created by blowing a small amount of carbon dioxide gas into the abdominal cavity. The abdomen is essentially blown up like a balloon (insufflated). This elevates the abdominal wall above the internal organs like a dome. The carbon dioxide gas is common to the human body and can be absorbed by tissue and removed by the respiratory system. Once  the repair is completed, the small incisions will be closed with either stitches (sutures) or staples (just like a paper stapler only this staple holds the skin together). LET YOUR CAREGIVERS KNOW ABOUT:  Allergies.  Medications taken including herbs, eye drops, over the counter medications, and creams.  Use of steroids (by mouth or creams).  Previous problems with anesthetics or Novocaine.  Possibility of pregnancy, if this applies.  History of blood clots (thrombophlebitis).  History of bleeding or blood problems.  Previous surgery.  Other health problems. BEFORE THE PROCEDURE  Laparoscopy can be done either in a hospital or out-patient clinic. You may be given a mild sedative to help you relax before the procedure. Once in the operating room, you will be given a general anesthesia to make you sleep (unless you and your caregiver choose a different anesthetic).  AFTER THE PROCEDURE  After the procedure you will be watched in a recovery area. Depending on what type of hernia was repaired, you might be admitted to the hospital or you might go home the same day. With this procedure you may have less pain and scarring. This usually results in a quicker recovery and less risk of infection. HOME CARE INSTRUCTIONS   Bed rest is not required. You may continue your normal activities but avoid heavy lifting (more than 10 pounds) or straining.  Cough gently. If you are a smoker it is best to stop, as even the best hernia repair can break down with the continual strain of coughing.  Avoid driving until given  the OK by your surgeon.  There are no dietary restrictions unless given otherwise.  TAKE ALL MEDICATIONS AS DIRECTED.  Only take over-the-counter or prescription medicines for pain, discomfort, or fever as directed by your caregiver. SEEK MEDICAL CARE IF:   There is increasing abdominal pain or pain in your incisions.  There is more bleeding from incisions, other than minimal  spotting.  You feel light headed or faint.  You develop an unexplained fever, chills, and/or an oral temperature above 102 F (38.9 C).  You have redness, swelling, or increasing pain in the wound.  Pus coming from wound.  A foul smell coming from the wound or dressings. SEEK IMMEDIATE MEDICAL CARE IF:   You develop a rash.  You have difficulty breathing.  You have any allergic problems. MAKE SURE YOU:   Understand these instructions.  Will watch your condition.  Will get help right away if you are not doing well or get worse. Document Released: 07/07/2005 Document Revised: 09/29/2011 Document Reviewed: 06/06/2009 [facility] Patient Information 2013 [place], [place].

## 2012-09-13 ENCOUNTER — Ambulatory Visit (INDEPENDENT_AMBULATORY_CARE_PROVIDER_SITE_OTHER): Payer: Medicare Other | Admitting: Pharmacist

## 2012-09-13 DIAGNOSIS — I2699 Other pulmonary embolism without acute cor pulmonale: Secondary | ICD-10-CM

## 2012-09-13 DIAGNOSIS — Z7901 Long term (current) use of anticoagulants: Secondary | ICD-10-CM

## 2012-09-13 NOTE — Progress Notes (Signed)
Anti-Coagulation Progress Note  Breanna Wilkins is a 52 y.o. female who is currently on an anti-coagulation regimen.    RECENT RESULTS: Recent results are below, the most recent result is correlated with a dose of 40 mg. per week: Lab Results  Component Value Date   INR 2.60 09/13/2012   INR 2.80 08/16/2012   INR 3.5 07/12/2012    ANTI-COAG DOSE: Anticoagulation Dose Instructions as of 09/13/2012     [name] Tue Wed Thu Fri Sat   New Dose 5 mg 5 mg 5 mg 10 mg 5 mg 5 mg 5 mg       ANTICOAG SUMMARY: Anticoagulation Episode Summary   Current INR goal 2.0-3.0   Next INR check 10/11/2012   INR from last check 2.60 (09/13/2012)   Weekly max dose    Target end date    [facility] location [facility]   Preferred lab    Send INR reminders to       Comments         ANTICOAG TODAY: Anticoagulation Summary as of 09/13/2012   INR goal 2.0-3.0   Selected INR 2.60 (09/13/2012)   Next INR check 10/11/2012   Target end date     Anticoagulation Episode Summary   INR check location [facility]   Preferred lab    Send INR reminders to    Comments       PATIENT INSTRUCTIONS: Patient Instructions  Patient instructed to take medications as defined in the Anti-coagulation Track section of this encounter.  Patient instructed to take today's dose.  Patient verbalized understanding of these instructions.  Patient instructed she is to DISCONTINUE WARFARIN 5 (FIVE) days in advance of planned abdominal surgery scheduled for 10-MAR-14. She will resume postoperatively based upon her instructions provided by pharmacist at time of discharge or by her surgeon--until such time she sees me in [facility] scheduled for 24-MAR-14.     FOLLOW-UP Return in 4 weeks (on 10/11/2012) for Follow up INR at 1100h.  Hulen Luster, III Pharm.D., CACP

## 2012-09-13 NOTE — Patient Instructions (Signed)
Patient instructed to take medications as defined in the Anti-coagulation Track section of this encounter.  Patient instructed to take today's dose.  Patient verbalized understanding of these instructions.  Patient instructed she is to DISCONTINUE WARFARIN 5 (FIVE) days in advance of planned abdominal surgery scheduled for 10-MAR-14. She will resume postoperatively based upon her instructions provided by pharmacist at time of discharge or by her surgeon--until such time she sees me in [facility] scheduled for 24-MAR-14.

## 2012-09-14 ENCOUNTER — Other Ambulatory Visit: Payer: Self-pay | Admitting: *Deleted

## 2012-09-14 MED ORDER — WARFARIN SODIUM 10 MG PO TABS: 5.0000 mg | ORAL_TABLET | Freq: Every day | ORAL | Status: DC

## 2012-09-15 ENCOUNTER — Encounter (HOSPITAL_COMMUNITY): Payer: Self-pay | Admitting: Pharmacist

## 2012-09-20 ENCOUNTER — Ambulatory Visit (HOSPITAL_COMMUNITY)
Admission: RE | Admit: 2012-09-20 | Discharge: 2012-09-20 | Disposition: A | Discharge status: Home / Self Care | Payer: Medicare Other | Source: Ambulatory Visit | Attending: General Surgery | Admitting: General Surgery

## 2012-09-20 ENCOUNTER — Encounter (HOSPITAL_COMMUNITY): Payer: Self-pay

## 2012-09-20 ENCOUNTER — Encounter (HOSPITAL_COMMUNITY)
Admission: RE | Admit: 2012-09-20 | Discharge: 2012-09-20 | Disposition: A | Discharge status: Home / Self Care | Payer: Medicare Other | Source: Ambulatory Visit | Attending: General Surgery | Admitting: General Surgery

## 2012-09-20 DIAGNOSIS — Z01812 Encounter for preprocedural laboratory examination: Secondary | ICD-10-CM | POA: Insufficient documentation

## 2012-09-20 DIAGNOSIS — K432 Incisional hernia without obstruction or gangrene: Secondary | ICD-10-CM | POA: Insufficient documentation

## 2012-09-20 DIAGNOSIS — M47814 Spondylosis without myelopathy or radiculopathy, thoracic region: Secondary | ICD-10-CM | POA: Insufficient documentation

## 2012-09-20 HISTORY — DX: Heart failure, unspecified: I50.9

## 2012-09-20 LAB — SURGICAL PCR SCREEN: MRSA, PCR: NEGATIVE

## 2012-09-20 LAB — COMPREHENSIVE METABOLIC PANEL
ALT: 11 U/L (ref 0–35)
Alkaline Phosphatase: 68 U/L (ref 39–117)
CO2: 27 mEq/L (ref 19–32)
GFR calc Af Amer: >90 mL/min (ref >90)
Glucose, Bld: 94 mg/dL (ref 70–99)
Potassium: 3.7 mEq/L (ref 3.5–5.1)
Sodium: 140 mEq/L (ref 135–145)
Total Protein: 7.7 g/dL (ref 6.0–8.3)

## 2012-09-20 LAB — CBC WITH DIFFERENTIAL/PLATELET
Eosinophils Absolute: 0.1 10*3/uL (ref 0.0–0.7)
Lymphocytes Relative: 23 % (ref 12–46)
Lymphs Abs: 1.5 10*3/uL (ref 0.7–4.0)
Neutro Abs: 4.4 10*3/uL (ref 1.7–7.7)
Neutrophils Relative %: 66 % (ref 43–77)
Platelets: 280 10*3/uL (ref 150–400)
RBC: 4.81 MIL/uL (ref 3.87–5.11)
WBC: 6.7 10*3/uL (ref 4.0–10.5)

## 2012-09-20 NOTE — Pre-Procedure Instructions (Signed)
Breanna Wilkins  09/20/2012   Your procedure is scheduled on:  Monday, March 10th  Report to [facility] at 10:30AM.  Call this number if you have problems the morning of surgery: [phone]   Remember:   Do not eat food or drink liquids after midnight.    Take these medicines the morning of surgery with A SIP OF WATER: Hydrocodone- Acetaminophen (Norco) if needed.  Stop taking Aspirin, Coumadin, Plavix, Effient and Herbal medications.  Don not take any NSAIDs ie: Ibuprofen,  Advil,Naproxen or any medication containing Aspirin.   Do not wear jewelry, make-up or nail polish.  Do not wear lotions, powders, or perfumes. You may wear deodorant.  Do not shave 48 hours prior to surgery. Men may shave face and neck.  Do not bring valuables to the hospital.  Contacts, dentures or bridgework may not be worn into surgery.  Leave suitcase in the car. After surgery it may be brought to your room.  For patients admitted to the hospital, checkout time is 11:00 AM the day of  discharge.   Patients discharged the day of surgery will not be allowed to drive  home.  Name and phone number of your driver: --   Special Instructions: Shower using CHG 2 nights before surgery and the night before surgery.  If you shower the day of surgery use CHG.  Use special wash - you have one bottle of CHG for all showers.  You should use approximately 1/3 of the bottle for each shower.   Please read over the following fact sheets that you were given: Pain Booklet, Coughing and Deep Breathing and Surgical Site Infection Prevention

## 2012-09-24 NOTE — H&P (Signed)
Breanna Wilkins    MRN:  [id number]   Description: 52 year old female  Provider: Ernestene Mention, MD  Department: [facility]       Diagnoses    Irreducible ventral incisional hernia    -  Primary    552.20         Current Vitals - Last Recorded    BP Pulse Temp(Src) Resp Ht Wt    148/98 72 97.6 F (36.4 C) (Temporal) 18 5\' 7"  (1.702 m) 303 lb 3.2 oz (137.531 kg)     BMI 47.48 kg/m2                History and Physical   Status: Signed                       HPI Breanna Wilkins is a 52 y.o. female.  She is referred by Dr. Marcelino Duster [facility]. Dr. Arnaldo Natal manages her Coumadin.   The patient is here to talk to me about a symptomatic incisional hernia at the umbilicus. She had a laparoscopic hysterectomy in the past. She's noticed a hernia at her umbilicus for 6 years. It is getting bigger and now hurts to touch. She had a CT scan in April 2013 at which time she had a gastroenteritis and the hernia was noted as well  and there was fat present within the hernia sac. Currently she has no GI symptoms, just more pain when she touches the hernia.   Past history is significant for morbid obesity, obstructive sleep apnea, hypertension, the laparoscopic hysterectomy. She has been on Coumadin for 2 years for a popliteal DVT and pulmonary embolism. There is no current plan to take her off of this. Her Coumadin is regulated by Dr. Aundria Rud in the [facility] internal medicine clinic.        Past Medical History   Diagnosis  Date   .  Shoulder pain     .  Pulmonary embolus  01/16/10       on Coumadin indefinitely   .  OSA (obstructive sleep apnea)     .  Abdominal hernia     .  Hypertension     .  Overweight     .  Lung nodule  12/2009       Very small, left upper lobe by CT June, 2011, one-year followup was stable   .  Shortness of breath     .  Warfarin anticoagulation     .  Ejection fraction         65-70%, vigorous function, echo,  March, 2011         Past Surgical History   Procedure  Laterality  Date   .  Abdominal hysterectomy    07/2002       laparoscopic assisted vaginal hysterectomy for menorrhagia, dysmenorrhea, anemia, fibroids         Family History   Problem  Relation  Age of Onset   .  Diabetes  Father     .  Diabetes  Sister     .  Diabetes  Mother     .  Deep vein thrombosis  Sister     .  Ovarian cancer  Mother     .  Bone cancer  Maternal Grandmother          Social History History   Substance Use Topics   .  Smoking status:  Former Smoker   .  Smokeless tobacco:  Not on file   .  Alcohol Use:  No         Comment: 6 pack of beer         Allergies   Allergen  Reactions   .  Aspirin         REACTION: Nausea (325mg )         Current Outpatient Prescriptions   Medication  Sig  Dispense  Refill   .  furosemide (LASIX) 20 MG tablet  Take 40 mg by mouth daily.         Marland Kitchen  HYDROcodone-acetaminophen (NORCO/VICODIN) 5-325 MG per tablet  Take 1 tablet by mouth every 6 (six) hours as needed for pain.   30 tablet   0   .  lisinopril (PRINIVIL,ZESTRIL) 40 MG tablet  Take 1 tablet (40 mg total) by mouth daily.   31 tablet   6   .  pravastatin (PRAVACHOL) 40 MG tablet  Take 1 tablet (40 mg total) by mouth every evening.   30 tablet   11   .  warfarin (COUMADIN) 10 MG tablet  Take 5-10 mg by mouth daily. Take 0.5 tablet daily, except on Wednesday takes 1 tablet                    Review of Systems   Constitutional: Negative for fever, chills and unexpected weight change.  HENT: Negative for hearing loss, congestion, sore throat, trouble swallowing and voice change.   Eyes: Negative for visual disturbance.  Respiratory: Positive for choking and shortness of breath. Negative for cough and wheezing.   Cardiovascular: Negative for chest pain, palpitations and leg swelling.  Gastrointestinal: Positive for abdominal pain. Negative for nausea, vomiting, diarrhea, constipation, blood in  stool, abdominal distention and anal bleeding.  Genitourinary: Negative for hematuria, vaginal bleeding and difficulty urinating.  Musculoskeletal: Negative for arthralgias.  Skin: Negative for rash and wound.  Neurological: Negative for seizures, syncope and headaches.  Hematological: Negative for adenopathy. Does not bruise/bleed easily.  Psychiatric/Behavioral: Negative for confusion.      Blood pressure 148/98, pulse 72, temperature 97.6 F (36.4 C), temperature source Temporal, resp. rate 18, height 5\' 7"  (1.702 m), weight 303 lb 3.2 oz (137.531 kg).   Physical Exam   Constitutional: She is oriented to person, place, and time. She appears well-developed and well-nourished. No distress.  Weight 303 pounds. BMI 47.48. HENT:   Head: Normocephalic and atraumatic.   Nose: Nose normal.   Mouth/Throat: No oropharyngeal exudate.  Eyes: Conjunctivae and EOM are normal. Pupils are equal, round, and reactive to light. Left eye exhibits no discharge. No scleral icterus.  Neck: Neck supple. No JVD present. No tracheal deviation present. No thyromegaly present.  Cardiovascular: Normal rate, regular rhythm, normal heart sounds and intact distal pulses.    No murmur heard. Pulmonary/Chest: Effort normal and breath sounds normal. No respiratory distress. She has no wheezes. She has no rales. She exhibits no tenderness.  Abdominal: Soft. Bowel sounds are normal. She exhibits no distension and no mass. There is no tenderness. There is no rebound and no guarding.    Hernia at umbilicus and just below. Well-healed vertical incision below umbilicus. The hernia sac is at least 6 cm in diameter. Partially reducible. No skin inflammation. 2 small trocar sites in the suprapubic area.  Musculoskeletal: She exhibits no edema and no tenderness.  Lymphadenopathy:    She has no cervical adenopathy.  Neurological: She  is alert and oriented to person, place, and time. She exhibits normal muscle tone.  Coordination normal.  Skin: Skin is warm. No rash noted. She is not diaphoretic. No erythema. No pallor.  Psychiatric: She has a normal mood and affect. Her behavior is normal. Judgment and thought content normal.     Data Reviewed CT scan. [facility] notes from [facility]    Assessment    Partially incarcerated periumbilical incisional hernia. Symptomatic.Theoretically this just contains fat at the present time. She is at risk for progression, incarceration, and strangulation.    Morbid obesity    History pulmonary embolism, on chronic Coumadin   Obstructive sleep apnea   Hypertension   History laparoscopic TAH     Plan    I had a long talk with the patient about indications and details of hernia repair. She wants to have this repaired at this time a cause of progressive pain and progressive size. I think she is a reasonable candidate for a laparoscopic repair, although this may have to be converted to open.   We'll schedule for laparoscopic repair of her ventral incisional hernia with mesh, possible open repair at [facility] in the  near future.   I discussed the indications, details, techniques, and numerous risks of the surgery with her. She understands all these issues. All of her questions are answered. She agrees with this plan.   We will ask Dr. Arnaldo Natal for permission to take her off Coumadin for 5 days. I will defer to him as to whether he wants a Lovenox bridge. She will be restarted on her Coumadin in the immediate postop period           Haywood M. Derrell Lolling, M.D., [facility] General and Minimally invasive Surgery Breast and Colorectal Surgery Office:   [phone] Pager:   [phone]

## 2012-09-26 MED ORDER — HEPARIN SODIUM (PORCINE) 5000 UNIT/ML IJ SOLN
5000.0000 [IU] | Freq: Once | INTRAMUSCULAR | Status: AC
  Administered 2012-09-27: 5000 [IU] via SUBCUTANEOUS
  Filled 2012-09-26: qty 1

## 2012-09-26 MED ORDER — DEXTROSE 5 % IV SOLN
3.0000 g | INTRAVENOUS | Status: AC
  Administered 2012-09-27: 3 g via INTRAVENOUS
  Filled 2012-09-26: qty 3000

## 2012-09-26 MED ORDER — CHLORHEXIDINE GLUCONATE 4 % EX LIQD: 1.0000 "application " | Freq: Once | CUTANEOUS | Status: DC

## 2012-09-27 ENCOUNTER — Encounter (HOSPITAL_COMMUNITY): Payer: Self-pay | Admitting: Certified Registered"

## 2012-09-27 ENCOUNTER — Encounter (HOSPITAL_COMMUNITY)
Admission: RE | Disposition: A | Discharge status: Home / Self Care | Payer: Self-pay | Source: Ambulatory Visit | Attending: General Surgery

## 2012-09-27 ENCOUNTER — Ambulatory Visit (HOSPITAL_COMMUNITY): Payer: Medicare Other | Admitting: Certified Registered"

## 2012-09-27 ENCOUNTER — Observation Stay (HOSPITAL_COMMUNITY): Payer: Medicare Other

## 2012-09-27 ENCOUNTER — Inpatient Hospital Stay (HOSPITAL_COMMUNITY)
Admission: RE | Admit: 2012-09-27 | Discharge: 2012-10-07 | DRG: 981 | Disposition: A | Discharge status: Home / Self Care | Payer: Medicare Other | Source: Ambulatory Visit | Attending: Internal Medicine | Admitting: Internal Medicine

## 2012-09-27 DIAGNOSIS — I2510 Atherosclerotic heart disease of native coronary artery without angina pectoris: Secondary | ICD-10-CM | POA: Diagnosis present

## 2012-09-27 DIAGNOSIS — Z6841 Body Mass Index (BMI) 40.0 and over, adult: Secondary | ICD-10-CM

## 2012-09-27 DIAGNOSIS — Z7901 Long term (current) use of anticoagulants: Secondary | ICD-10-CM

## 2012-09-27 DIAGNOSIS — E669 Obesity, unspecified: Secondary | ICD-10-CM

## 2012-09-27 DIAGNOSIS — IMO0002 Reserved for concepts with insufficient information to code with codable children: Secondary | ICD-10-CM

## 2012-09-27 DIAGNOSIS — I5041 Acute combined systolic (congestive) and diastolic (congestive) heart failure: Secondary | ICD-10-CM

## 2012-09-27 DIAGNOSIS — I219 Acute myocardial infarction, unspecified: Secondary | ICD-10-CM

## 2012-09-27 DIAGNOSIS — Z86711 Personal history of pulmonary embolism: Secondary | ICD-10-CM

## 2012-09-27 DIAGNOSIS — R141 Gas pain: Secondary | ICD-10-CM | POA: Diagnosis not present

## 2012-09-27 DIAGNOSIS — Z9119 Patient's noncompliance with other medical treatment and regimen: Secondary | ICD-10-CM

## 2012-09-27 DIAGNOSIS — R4182 Altered mental status, unspecified: Secondary | ICD-10-CM | POA: Diagnosis not present

## 2012-09-27 DIAGNOSIS — I2129 ST elevation (STEMI) myocardial infarction involving other sites: Principal | ICD-10-CM | POA: Diagnosis not present

## 2012-09-27 DIAGNOSIS — Z86718 Personal history of other venous thrombosis and embolism: Secondary | ICD-10-CM

## 2012-09-27 DIAGNOSIS — K430 Incisional hernia with obstruction, without gangrene: Secondary | ICD-10-CM | POA: Diagnosis present

## 2012-09-27 DIAGNOSIS — K436 Other and unspecified ventral hernia with obstruction, without gangrene: Secondary | ICD-10-CM | POA: Diagnosis present

## 2012-09-27 DIAGNOSIS — I959 Hypotension, unspecified: Secondary | ICD-10-CM

## 2012-09-27 DIAGNOSIS — I509 Heart failure, unspecified: Secondary | ICD-10-CM | POA: Diagnosis present

## 2012-09-27 DIAGNOSIS — Z87891 Personal history of nicotine dependence: Secondary | ICD-10-CM

## 2012-09-27 DIAGNOSIS — I213 ST elevation (STEMI) myocardial infarction of unspecified site: Secondary | ICD-10-CM

## 2012-09-27 DIAGNOSIS — R142 Eructation: Secondary | ICD-10-CM | POA: Diagnosis not present

## 2012-09-27 DIAGNOSIS — R0602 Shortness of breath: Secondary | ICD-10-CM

## 2012-09-27 DIAGNOSIS — I10 Essential (primary) hypertension: Secondary | ICD-10-CM

## 2012-09-27 DIAGNOSIS — R9430 Abnormal result of cardiovascular function study, unspecified: Secondary | ICD-10-CM

## 2012-09-27 DIAGNOSIS — I214 Non-ST elevation (NSTEMI) myocardial infarction: Secondary | ICD-10-CM

## 2012-09-27 DIAGNOSIS — J9600 Acute respiratory failure, unspecified whether with hypoxia or hypercapnia: Secondary | ICD-10-CM

## 2012-09-27 DIAGNOSIS — I2699 Other pulmonary embolism without acute cor pulmonale: Secondary | ICD-10-CM

## 2012-09-27 DIAGNOSIS — Z91199 Patient's noncompliance with other medical treatment and regimen due to unspecified reason: Secondary | ICD-10-CM

## 2012-09-27 DIAGNOSIS — J95821 Acute postprocedural respiratory failure: Secondary | ICD-10-CM | POA: Diagnosis not present

## 2012-09-27 DIAGNOSIS — E876 Hypokalemia: Secondary | ICD-10-CM | POA: Diagnosis not present

## 2012-09-27 DIAGNOSIS — G4733 Obstructive sleep apnea (adult) (pediatric): Secondary | ICD-10-CM

## 2012-09-27 DIAGNOSIS — R570 Cardiogenic shock: Secondary | ICD-10-CM | POA: Diagnosis not present

## 2012-09-27 HISTORY — PX: INCISIONAL HERNIA REPAIR: SHX193

## 2012-09-27 HISTORY — PX: INSERTION OF MESH: SHX5868

## 2012-09-27 LAB — CBC WITH DIFFERENTIAL/PLATELET
Eosinophils Relative: 0 % (ref 0–5)
HCT: 43.1 % (ref 36.0–46.0)
Hemoglobin: 14 g/dL (ref 12.0–15.0)
Lymphocytes Relative: 7 % — ABNORMAL LOW (ref 12–46)
Lymphs Abs: 0.6 10*3/uL — ABNORMAL LOW (ref 0.7–4.0)
MCV: 86.2 fL (ref 78.0–100.0)
Monocytes Absolute: 0.4 10*3/uL (ref 0.1–1.0)
Monocytes Relative: 5 % (ref 3–12)
Neutro Abs: 7.3 10*3/uL (ref 1.7–7.7)
RBC: 5 MIL/uL (ref 3.87–5.11)
WBC: 8.4 10*3/uL (ref 4.0–10.5)

## 2012-09-27 LAB — BLOOD GAS, ARTERIAL
Acid-Base Excess: 0.4 mmol/L (ref 0.0–2.0)
Acid-base deficit: 1.9 mmol/L (ref 0.0–2.0)
Drawn by: 35849
Drawn by: 10006
FIO2: 100 %
FIO2: 1 %
MECHVT: 650 mL
MECHVT: 550 mL
O2 Saturation: 95.6 %
Patient temperature: 98.6
RATE: 16 resp/min
RATE: 18 resp/min
TCO2: 27.3 mmol/L (ref 0–100)
pCO2 arterial: 66.9 mmHg (ref 35.0–45.0)
pH, Arterial: 7.201 — ABNORMAL LOW (ref 7.350–7.450)
pO2, Arterial: 88.3 mmHg (ref 80.0–100.0)

## 2012-09-27 LAB — BASIC METABOLIC PANEL
CO2: 27 mEq/L (ref 19–32)
Calcium: 9 mg/dL (ref 8.4–10.5)
Chloride: 101 mEq/L (ref 96–112)
Creatinine, Ser: 0.74 mg/dL (ref 0.50–1.10)
Glucose, Bld: 166 mg/dL — ABNORMAL HIGH (ref 70–99)

## 2012-09-27 LAB — PHOSPHORUS: Phosphorus: 4.4 mg/dL (ref 2.3–4.6)

## 2012-09-27 LAB — URINALYSIS, ROUTINE W REFLEX MICROSCOPIC
Leukocytes, UA: NEGATIVE
Nitrite: NEGATIVE
Protein, ur: NEGATIVE mg/dL
Urobilinogen, UA: 0.2 mg/dL (ref 0.0–1.0)

## 2012-09-27 LAB — PROTIME-INR: INR: 1.07 (ref 0.00–1.49)

## 2012-09-27 LAB — GLUCOSE, CAPILLARY: Glucose-Capillary: 112 mg/dL — ABNORMAL HIGH (ref 70–99)

## 2012-09-27 SURGERY — REPAIR, HERNIA, INCISIONAL, LAPAROSCOPIC
Anesthesia: General | Site: Abdomen | Wound class: Clean

## 2012-09-27 MED ORDER — ROCURONIUM BROMIDE 50 MG/5ML IV SOLN
INTRAVENOUS | Status: AC
  Filled 2012-09-27: qty 2

## 2012-09-27 MED ORDER — BUPIVACAINE-EPINEPHRINE 0.25% -1:200000 IJ SOLN
INTRAMUSCULAR | Status: AC
  Filled 2012-09-27: qty 1

## 2012-09-27 MED ORDER — LISINOPRIL 40 MG PO TABS
40.0000 mg | ORAL_TABLET | Freq: Every day | ORAL | Status: DC
  Filled 2012-09-27: qty 1

## 2012-09-27 MED ORDER — SODIUM CHLORIDE 0.9 % IV SOLN
INTRAVENOUS | Status: DC
  Administered 2012-09-27: 21:00:00 via INTRAVENOUS

## 2012-09-27 MED ORDER — DEXTROSE 5 % IV SOLN
3.0000 g | Freq: Four times a day (QID) | INTRAVENOUS | Status: AC
  Administered 2012-09-27 – 2012-09-28 (×3): 3 g via INTRAVENOUS
  Filled 2012-09-27 (×3): qty 3000

## 2012-09-27 MED ORDER — HYDROMORPHONE HCL PF 1 MG/ML IJ SOLN
INTRAMUSCULAR | Status: AC
  Filled 2012-09-27: qty 1

## 2012-09-27 MED ORDER — LIDOCAINE HCL (CARDIAC) 20 MG/ML IV SOLN
INTRAVENOUS | Status: DC | PRN
  Administered 2012-09-27: 90 mg via INTRAVENOUS

## 2012-09-27 MED ORDER — MIDAZOLAM HCL 5 MG/5ML IJ SOLN
INTRAMUSCULAR | Status: DC | PRN
  Administered 2012-09-27: 1 mg via INTRAVENOUS
  Administered 2012-09-27: 2 mg via INTRAVENOUS

## 2012-09-27 MED ORDER — LACTATED RINGERS IV SOLN: INTRAVENOUS | Status: DC

## 2012-09-27 MED ORDER — PROPOFOL 10 MG/ML IV EMUL
5.0000 ug/kg/min | INTRAVENOUS | Status: DC
  Filled 2012-09-27: qty 100

## 2012-09-27 MED ORDER — MEPERIDINE HCL 25 MG/ML IJ SOLN: 6.2500 mg | INTRAMUSCULAR | Status: DC | PRN

## 2012-09-27 MED ORDER — NEOSTIGMINE METHYLSULFATE 1 MG/ML IJ SOLN
INTRAMUSCULAR | Status: DC | PRN
  Administered 2012-09-27: 5 mg via INTRAVENOUS

## 2012-09-27 MED ORDER — FENTANYL BOLUS VIA INFUSION
25.0000 ug | Freq: Four times a day (QID) | INTRAVENOUS | Status: DC | PRN
  Filled 2012-09-27: qty 100

## 2012-09-27 MED ORDER — PROPOFOL 10 MG/ML IV BOLUS
INTRAVENOUS | Status: DC | PRN
  Administered 2012-09-27: 200 mg via INTRAVENOUS
  Administered 2012-09-27: 50 mg via INTRAVENOUS

## 2012-09-27 MED ORDER — MIDAZOLAM BOLUS VIA INFUSION
1.0000 mg | INTRAVENOUS | Status: DC | PRN
  Filled 2012-09-27: qty 2

## 2012-09-27 MED ORDER — MIDAZOLAM HCL 5 MG/ML IJ SOLN
1.0000 mg/h | INTRAMUSCULAR | Status: DC
  Filled 2012-09-27: qty 10

## 2012-09-27 MED ORDER — ETOMIDATE 2 MG/ML IV SOLN
INTRAVENOUS | Status: AC
  Filled 2012-09-27: qty 20

## 2012-09-27 MED ORDER — KETOROLAC TROMETHAMINE 30 MG/ML IJ SOLN
INTRAMUSCULAR | Status: DC | PRN
  Administered 2012-09-27: 30 mg via INTRAVENOUS

## 2012-09-27 MED ORDER — ROCURONIUM BROMIDE 100 MG/10ML IV SOLN
INTRAVENOUS | Status: DC | PRN
  Administered 2012-09-27: 40 mg via INTRAVENOUS
  Administered 2012-09-27 (×2): 10 mg via INTRAVENOUS
  Administered 2012-09-27: 5 mg via INTRAVENOUS

## 2012-09-27 MED ORDER — ENOXAPARIN SODIUM 30 MG/0.3ML ~~LOC~~ SOLN
40.0000 mg | SUBCUTANEOUS | Status: DC
  Filled 2012-09-27: qty 0.4

## 2012-09-27 MED ORDER — FENTANYL CITRATE 0.05 MG/ML IJ SOLN
INTRAMUSCULAR | Status: DC | PRN
  Administered 2012-09-27: 100 ug via INTRAVENOUS
  Administered 2012-09-27 (×3): 50 ug via INTRAVENOUS

## 2012-09-27 MED ORDER — ASPIRIN 300 MG RE SUPP
300.0000 mg | Freq: Every day | RECTAL | Status: DC
  Administered 2012-09-27 – 2012-10-04 (×8): 300 mg via RECTAL
  Filled 2012-09-27 (×9): qty 1

## 2012-09-27 MED ORDER — METOPROLOL TARTRATE 1 MG/ML IV SOLN
5.0000 mg | Freq: Four times a day (QID) | INTRAVENOUS | Status: DC
  Administered 2012-09-27 – 2012-10-04 (×27): 5 mg via INTRAVENOUS
  Filled 2012-09-27 (×34): qty 5

## 2012-09-27 MED ORDER — LIDOCAINE HCL (CARDIAC) 20 MG/ML IV SOLN
INTRAVENOUS | Status: AC
  Filled 2012-09-27: qty 5

## 2012-09-27 MED ORDER — ONDANSETRON HCL 4 MG/2ML IJ SOLN
INTRAMUSCULAR | Status: DC | PRN
  Administered 2012-09-27: 4 mg via INTRAVENOUS

## 2012-09-27 MED ORDER — PROMETHAZINE HCL 25 MG/ML IJ SOLN: 6.2500 mg | INTRAMUSCULAR | Status: DC | PRN

## 2012-09-27 MED ORDER — ONDANSETRON HCL 4 MG PO TABS: 4.0000 mg | ORAL_TABLET | Freq: Four times a day (QID) | ORAL | Status: DC | PRN

## 2012-09-27 MED ORDER — ONDANSETRON HCL 4 MG/2ML IJ SOLN
4.0000 mg | Freq: Four times a day (QID) | INTRAMUSCULAR | Status: DC | PRN
  Administered 2012-10-01: 4 mg via INTRAVENOUS
  Filled 2012-09-27: qty 2

## 2012-09-27 MED ORDER — PROPOFOL 10 MG/ML IV EMUL
5.0000 ug/kg/min | INTRAVENOUS | Status: DC
  Administered 2012-09-27: 10 ug/kg/min via INTRAVENOUS
  Filled 2012-09-27: qty 100

## 2012-09-27 MED ORDER — HEPARIN BOLUS VIA INFUSION
3000.0000 [IU] | Freq: Once | INTRAVENOUS | Status: AC
  Administered 2012-09-27: 3000 [IU] via INTRAVENOUS
  Filled 2012-09-27: qty 3000

## 2012-09-27 MED ORDER — FUROSEMIDE 10 MG/ML IJ SOLN
40.0000 mg | Freq: Three times a day (TID) | INTRAMUSCULAR | Status: DC
  Administered 2012-09-28: 40 mg via INTRAVENOUS
  Filled 2012-09-27: qty 4

## 2012-09-27 MED ORDER — FUROSEMIDE 10 MG/ML IJ SOLN
INTRAMUSCULAR | Status: AC
  Administered 2012-09-27: 40 mg
  Filled 2012-09-27: qty 4

## 2012-09-27 MED ORDER — ENOXAPARIN SODIUM 40 MG/0.4ML ~~LOC~~ SOLN
40.0000 mg | SUBCUTANEOUS | Status: DC
  Filled 2012-09-27: qty 0.4

## 2012-09-27 MED ORDER — HYDROMORPHONE HCL PF 1 MG/ML IJ SOLN
0.2500 mg | INTRAMUSCULAR | Status: DC | PRN
  Administered 2012-09-27: 0.5 mg via INTRAVENOUS

## 2012-09-27 MED ORDER — HEPARIN (PORCINE) IN NACL 100-0.45 UNIT/ML-% IJ SOLN
3000.0000 [IU]/h | INTRAMUSCULAR | Status: DC
  Administered 2012-09-27 – 2012-09-28 (×2): 1500 [IU]/h via INTRAVENOUS
  Administered 2012-09-29: 1700 [IU]/h via INTRAVENOUS
  Administered 2012-09-30 (×2): 2400 [IU]/h via INTRAVENOUS
  Administered 2012-09-30 – 2012-10-01 (×2): 2700 [IU]/h via INTRAVENOUS
  Administered 2012-10-01 – 2012-10-04 (×5): 3000 [IU]/h via INTRAVENOUS
  Filled 2012-09-27 (×24): qty 250

## 2012-09-27 MED ORDER — PANTOPRAZOLE SODIUM 40 MG IV SOLR
40.0000 mg | INTRAVENOUS | Status: DC
  Administered 2012-09-27 – 2012-10-01 (×5): 40 mg via INTRAVENOUS
  Filled 2012-09-27 (×7): qty 40

## 2012-09-27 MED ORDER — OXYCODONE HCL 5 MG PO TABS: 5.0000 mg | ORAL_TABLET | Freq: Once | ORAL | Status: DC | PRN

## 2012-09-27 MED ORDER — BUPIVACAINE-EPINEPHRINE 0.25% -1:200000 IJ SOLN
INTRAMUSCULAR | Status: DC | PRN
  Administered 2012-09-27: 50 mL

## 2012-09-27 MED ORDER — OXYCODONE HCL 5 MG/5ML PO SOLN: 5.0000 mg | Freq: Once | ORAL | Status: DC | PRN

## 2012-09-27 MED ORDER — GLYCOPYRROLATE 0.2 MG/ML IJ SOLN
INTRAMUSCULAR | Status: DC | PRN
  Administered 2012-09-27: 0.6 mg via INTRAVENOUS

## 2012-09-27 MED ORDER — LACTATED RINGERS IV SOLN
INTRAVENOUS | Status: DC | PRN
  Administered 2012-09-27 (×2): via INTRAVENOUS
  Administered 2012-09-27: 1000 mL

## 2012-09-27 MED ORDER — FENTANYL CITRATE 0.05 MG/ML IJ SOLN
100.0000 ug | INTRAMUSCULAR | Status: DC | PRN
  Administered 2012-09-27 – 2012-09-30 (×11): 100 ug via INTRAVENOUS
  Administered 2012-10-01 – 2012-10-02 (×2): 50 ug via INTRAVENOUS
  Filled 2012-09-27 (×13): qty 2

## 2012-09-27 MED ORDER — SUCCINYLCHOLINE CHLORIDE 20 MG/ML IJ SOLN
INTRAMUSCULAR | Status: AC
  Filled 2012-09-27: qty 1

## 2012-09-27 MED ORDER — SODIUM CHLORIDE 0.9 % IV SOLN
25.0000 ug/h | INTRAVENOUS | Status: DC
  Filled 2012-09-27 (×2): qty 50

## 2012-09-27 MED ORDER — SUCCINYLCHOLINE CHLORIDE 20 MG/ML IJ SOLN
INTRAMUSCULAR | Status: DC | PRN
  Administered 2012-09-27 (×2): 100 mg via INTRAVENOUS

## 2012-09-27 MED ORDER — SIMVASTATIN 20 MG PO TABS
20.0000 mg | ORAL_TABLET | Freq: Every day | ORAL | Status: DC
  Administered 2012-09-28 – 2012-10-06 (×9): 20 mg via ORAL
  Filled 2012-09-27 (×10): qty 1

## 2012-09-27 MED ORDER — 0.9 % SODIUM CHLORIDE (POUR BTL) OPTIME
TOPICAL | Status: DC | PRN
  Administered 2012-09-27: 1000 mL

## 2012-09-27 MED ORDER — OXYCODONE-ACETAMINOPHEN 5-325 MG PO TABS
1.0000 | ORAL_TABLET | ORAL | Status: DC | PRN
  Administered 2012-10-02: 1 via ORAL
  Administered 2012-10-02 – 2012-10-05 (×3): 2 via ORAL
  Filled 2012-09-27: qty 1
  Filled 2012-09-27 (×3): qty 2

## 2012-09-27 MED ORDER — PROPOFOL 10 MG/ML IV EMUL
5.0000 ug/kg/min | INTRAVENOUS | Status: DC
  Administered 2012-09-28: 30.055 ug/kg/min via INTRAVENOUS
  Administered 2012-09-28 – 2012-09-29 (×7): 30 ug/kg/min via INTRAVENOUS
  Administered 2012-09-29: 15 ug/kg/min via INTRAVENOUS
  Administered 2012-09-29: 30 ug/kg/min via INTRAVENOUS
  Administered 2012-09-29: 15 ug/kg/min via INTRAVENOUS
  Administered 2012-09-30: 30 ug/kg/min via INTRAVENOUS
  Administered 2012-09-30 (×2): 20 ug/kg/min via INTRAVENOUS
  Administered 2012-09-30 (×3): 30 ug/kg/min via INTRAVENOUS
  Administered 2012-10-01: 40 ug/kg/min via INTRAVENOUS
  Filled 2012-09-27 (×21): qty 100

## 2012-09-27 SURGICAL SUPPLY — 45 items
APPLIER CLIP LOGIC TI 5 (MISCELLANEOUS) IMPLANT
BLADE SURG ROTATE 9660 (MISCELLANEOUS) IMPLANT
CANISTER SUCTION 2500CC (MISCELLANEOUS) IMPLANT
CHLORAPREP W/TINT 26ML (MISCELLANEOUS) ×2 IMPLANT
CLOTH BEACON ORANGE TIMEOUT ST (SAFETY) ×2 IMPLANT
COVER SURGICAL LIGHT HANDLE (MISCELLANEOUS) ×2 IMPLANT
DECANTER SPIKE VIAL GLASS SM (MISCELLANEOUS) ×2 IMPLANT
DERMABOND ADVANCED (GAUZE/BANDAGES/DRESSINGS) ×1
DERMABOND ADVANCED .7 DNX12 (GAUZE/BANDAGES/DRESSINGS) ×1 IMPLANT
DEVICE SECURE STRAP 25 ABSORB (INSTRUMENTS) ×6 IMPLANT
DEVICE TROCAR PUNCTURE CLOSURE (ENDOMECHANICALS) ×2 IMPLANT
DRAPE UTILITY 15X26 W/TAPE STR (DRAPE) ×4 IMPLANT
ELECT REM PT RETURN 9FT ADLT (ELECTROSURGICAL) ×2
ELECTRODE REM PT RTRN 9FT ADLT (ELECTROSURGICAL) ×1 IMPLANT
GLOVE BIOGEL PI IND STRL 7.5 (GLOVE) ×1 IMPLANT
GLOVE BIOGEL PI INDICATOR 7.5 (GLOVE) ×1
GLOVE EUDERMIC 7 POWDERFREE (GLOVE) ×2 IMPLANT
GLOVE SS BIOGEL STRL SZ 6.5 (GLOVE) ×1 IMPLANT
GLOVE SUPERSENSE BIOGEL SZ 6.5 (GLOVE) ×1
GOWN PREVENTION PLUS XLARGE (GOWN DISPOSABLE) ×2 IMPLANT
GOWN STRL NON-REIN LRG LVL3 (GOWN DISPOSABLE) ×4 IMPLANT
KIT BASIN OR (CUSTOM PROCEDURE TRAY) ×2 IMPLANT
KIT ROOM TURNOVER OR (KITS) ×2 IMPLANT
MARKER SKIN DUAL TIP RULER LAB (MISCELLANEOUS) ×2 IMPLANT
MESH PARIETEX 25X20 (Mesh General) ×2 IMPLANT
NEEDLE SPNL 22GX3.5 QUINCKE BK (NEEDLE) ×2 IMPLANT
NS IRRIG 1000ML POUR BTL (IV SOLUTION) ×2 IMPLANT
PAD ARMBOARD 7.5X6 YLW CONV (MISCELLANEOUS) ×4 IMPLANT
SCALPEL HARMONIC ACE (MISCELLANEOUS) IMPLANT
SCISSORS LAP 5X35 DISP (ENDOMECHANICALS) IMPLANT
SET IRRIG TUBING LAPAROSCOPIC (IRRIGATION / IRRIGATOR) ×2 IMPLANT
SLEEVE ENDOPATH XCEL 5M (ENDOMECHANICALS) ×4 IMPLANT
STAPLER VISISTAT 35W (STAPLE) ×2 IMPLANT
SUT MNCRL AB 4-0 PS2 18 (SUTURE) ×2 IMPLANT
SUT MON AB 4-0 PC3 18 (SUTURE) ×4 IMPLANT
SUT NOVA NAB DX-16 0-1 5-0 T12 (SUTURE) ×4 IMPLANT
SUT NOVA NAB GS-21 0 18 T12 DT (SUTURE) ×2 IMPLANT
SUT NOVAFIL 0 T 12 (SUTURE) IMPLANT
SUT VICRYL 0 TIES 12 18 (SUTURE) IMPLANT
TOWEL OR 17X24 6PK STRL BLUE (TOWEL DISPOSABLE) ×2 IMPLANT
TOWEL OR 17X26 10 PK STRL BLUE (TOWEL DISPOSABLE) ×2 IMPLANT
TRAY FOLEY CATH 14FRSI W/METER (CATHETERS) IMPLANT
TRAY LAPAROSCOPIC (CUSTOM PROCEDURE TRAY) ×2 IMPLANT
TROCAR XCEL NON-BLD 11X100MML (ENDOMECHANICALS) ×2 IMPLANT
TROCAR XCEL NON-BLD 5MMX100MML (ENDOMECHANICALS) ×2 IMPLANT

## 2012-09-27 NOTE — Transfer of Care (Signed)
Immediate Anesthesia Transfer of Care Note  Patient: Breanna Wilkins  Procedure(s) Performed: Procedure(s): LAPAROSCOPIC INCISIONAL HERNIA possible open (N/A) INSERTION OF MESH (N/A)  Patient Location: PACU  Anesthesia Type:General  Level of Consciousness: responds to stimulation  Airway & Oxygen Therapy: Patient re-intubated and Patient placed on Ventilator (see vital sign flow sheet for setting)  Post-op Assessment: Report given to PACU RN and Post -op Vital signs reviewed and stable  Post vital signs: Reviewed and stable  Complications: Patient re-intubated

## 2012-09-27 NOTE — Progress Notes (Signed)
Called to assist patient in respiratory distress. She was recently extubated in the PACU (15 minutes prior) and subsequently deteriorated.  Upon arrival pt was being bag mask ventilated by PACU staff.  Pt uncooperative and making poor respiratory efforts.  Decision was made to reintubate.  100mg  Propofol and 100mg  Succinylcholine was given. 8.0 ETT placed with glide scope. No complications.

## 2012-09-27 NOTE — Progress Notes (Signed)
Hypertensive.  Elevated troponin.   Recent Labs Lab 09/27/12 1727  TROPONINI 3.73*   Metoprolol 5 IV q6h Fentanyl / Versed gtt d/c'd Propofol gtt started Fentanyl 100 IV q2h PRN ordered for pain control Cycle troponin ASA / Heparin gtt contraindicated - recent surgery

## 2012-09-27 NOTE — Consult Note (Signed)
Cardiology IP Consult  Reason for Consult:elevated troponin, acute MI Referring Physician: Dr. Derrell Lolling  HPI: Ms. Hilburn is a 52 y.o.female with hx below relevant for pulmonary embolus on coumadin, morbid obesity is currently POD #0 from laparoscopic hernia pair. Repair went smoothly but her post-operative course was complicated by respiratory distress requiring re-intubation. A troponin was sent and was noted to be elevated at about 3.5. An ECG was then performed and machine noted lateral ST elevation. I am asked to assist in mgmt.   On my interview, patient was noted to be intubated but comfortable. She was able to respond to questions and denied any active chest pain. She did endorse mild abdominal discomfort.    Past Medical History  Diagnosis Date  . Shoulder pain   . Pulmonary embolus 01/16/10    on Coumadin indefinitely  . Abdominal hernia   . Hypertension   . Overweight   . Lung nodule 12/2009    Very small, left upper lobe by CT June, 2011, one-year followup was stable  . Shortness of breath   . Warfarin anticoagulation   . Ejection fraction     65-70%, vigorous function, echo, March, 2011  . CHF (congestive heart failure)   . OSA (obstructive sleep apnea)     CPAP    Past Surgical History  Procedure Laterality Date  . Abdominal hysterectomy  07/2002    laparoscopic assisted vaginal hysterectomy for menorrhagia, dysmenorrhea, anemia, fibroids  . Knee arthroscopy Left     Family History  Problem Relation Age of Onset  . Diabetes Father   . Diabetes Sister   . Diabetes Mother   . Deep vein thrombosis Sister   . Ovarian cancer Mother   . Bone cancer Maternal Grandmother     Social History:  reports that she has quit smoking. She does not have any smokeless tobacco history on file. She reports that she drinks about 3.6 ounces of alcohol per week. She reports that she does not use illicit drugs.  Allergies:  Allergies  Allergen Reactions  . Aspirin     REACTION:  Nausea (325mg )    Current Facility-Administered Medications  Medication Dose Route Frequency Provider Last Rate Last Dose  . 0.9 %  sodium chloride infusion   Intravenous Continuous Roxine Caddy, MD 20 mL/hr at 09/27/12 2117    . aspirin suppository 300 mg  300 mg Rectal Daily Lonia Farber, MD   300 mg at 09/27/12 2139  . ceFAZolin (ANCEF) 3 g in dextrose 5 % 50 mL IVPB  3 g Intravenous Q6H Ernestene Mention, MD   3 g at 09/27/12 2117  . etomidate (AMIDATE) 2 MG/ML injection           . fentaNYL (SUBLIMAZE) injection 100 mcg  100 mcg Intravenous Q2H PRN Lonia Farber, MD   100 mcg at 09/27/12 2201  . furosemide (LASIX) injection 40 mg  40 mg Intravenous Q8H Alyson Reedy, MD      . heparin ADULT infusion 100 units/mL (25000 units/250 mL)  1,500 Units/hr Intravenous Continuous Herby Abraham, RPH 15 mL/hr at 09/27/12 2232 1,500 Units/hr at 09/27/12 2232  . HYDROmorphone (DILAUDID) 1 MG/ML injection           . lidocaine (cardiac) 100 mg/36ml (XYLOCAINE) 20 MG/ML injection 2%           . [START ON 09/28/2012] lisinopril (PRINIVIL,ZESTRIL) tablet 40 mg  40 mg Oral Daily Ernestene Mention, MD      . metoprolol (  LOPRESSOR) injection 5 mg  5 mg Intravenous Q6H Lonia Farber, MD   5 mg at 09/27/12 2009  . ondansetron (ZOFRAN) tablet 4 mg  4 mg Oral Q6H PRN Ernestene Mention, MD       Or  . ondansetron [facility]) injection 4 mg  4 mg Intravenous Q6H PRN Ernestene Mention, MD      . oxyCODONE-acetaminophen (PERCOCET/ROXICET) 5-325 MG per tablet 1-2 tablet  1-2 tablet Oral Q4H PRN Ernestene Mention, MD      . pantoprazole (PROTONIX) injection 40 mg  40 mg Intravenous Q24H Roxine Caddy, MD   40 mg at 09/27/12 2225  . propofol (DIPRIVAN) 10 mg/ml infusion  5-70 mcg/kg/min Intravenous Continuous Lonia Farber, MD 8.4 mL/hr at 09/27/12 2203 10 mcg/kg/min at 09/27/12 2203  . rocuronium (ZEMURON) 50 MG/5ML injection           . [START ON 09/28/2012] simvastatin (ZOCOR)  tablet 20 mg  20 mg Oral q1800 Ernestene Mention, MD      . succinylcholine (ANECTINE) 20 MG/ML injection             ROS: A full review of systems is obtained and is negative except as noted in the HPI.  Physical Exam: Blood pressure 117/71, pulse 100, temperature 98.6 F (37 C), temperature source Oral, resp. rate 20, weight 140.3 kg (309 lb 4.9 oz), SpO2 100.00%.  GENERAL: no acute distress. Intubated.  EYES: Extra ocular movements are intact. Sclera is anicteric.  ENT: ET tube in place.  NECK: Supple. The thyroid is not enlarged.  LYMPH: There are no masses or lymphadenopathy present.  HEART: Regular rate and rhythm with no m/g/r.  Normal S1/S2. No JVD LUNGS: Mechanical BS bilaterally.  ABDOMEN: Soft, mildly tender to palpation. Non-distended. There is no hepatosplenomegaly.  EXTREMITIES: No clubbing, cyanosis, or edema.  PULSES: Carotids were +2 and equal bilaterally with no bruits. SKIN: Warm, dry, and intact.  NEUROLOGIC: No overt neurologic deficits were detected.  PSYCH: Normal judgment and insight, mood is appropriate.    Results: Results for orders placed during the hospital encounter of 09/27/12 (from the past 24 hour(s))  PROTIME-INR     Status: None   Collection Time    09/27/12  9:22 AM      Result Value Range   Prothrombin Time 13.8  11.6 - 15.2 seconds   INR 1.07  0.00 - 1.49  BLOOD GAS, ARTERIAL     Status: Abnormal   Collection Time    09/27/12  4:02 PM      Result Value Range   FIO2 100.00     Delivery systems VENTILATOR     Mode SYNCRONIZED INTERMITTENT MANDATORY VENTILATION     VT 650     Rate 16     Peep/cpap 5.0     pH, Arterial 7.201 (*) 7.350 - 7.450   pCO2 arterial 66.9 (*) 35.0 - 45.0 mmHg   pO2, Arterial 87.1  80.0 - 100.0 mmHg   Bicarbonate 25.2 (*) 20.0 - 24.0 mEq/L   TCO2 27.3  0 - 100 mmol/L   Acid-base deficit 1.9  0.0 - 2.0 mmol/L   O2 Saturation 93.2     Patient temperature 98.6     Collection site LEFT RADIAL     Drawn by [phone]      Sample type ARTERIAL DRAW     Allens test (pass/fail) PASS  PASS  CBC WITH DIFFERENTIAL     Status: Abnormal   Collection Time  09/27/12  5:04 PM      Result Value Range   WBC 8.4  4.0 - 10.5 K/uL   RBC 5.00  3.87 - 5.11 MIL/uL   Hemoglobin 14.0  12.0 - 15.0 g/dL   HCT 09.8  11.9 - 14.7 %   MCV 86.2  78.0 - 100.0 fL   MCH 28.0  26.0 - 34.0 pg   MCHC 32.5  30.0 - 36.0 g/dL   RDW 82.9 (*) 56.2 - 13.0 %   Platelets 274  150 - 400 K/uL   Neutrophils Relative 87 (*) 43 - 77 %   Neutro Abs 7.3  1.7 - 7.7 K/uL   Lymphocytes Relative 7 (*) 12 - 46 %   Lymphs Abs 0.6 (*) 0.7 - 4.0 K/uL   Monocytes Relative 5  3 - 12 %   Monocytes Absolute 0.4  0.1 - 1.0 K/uL   Eosinophils Relative 0  0 - 5 %   Eosinophils Absolute 0.0  0.0 - 0.7 K/uL   Basophils Relative 0  0 - 1 %   Basophils Absolute 0.0  0.0 - 0.1 K/uL  BASIC METABOLIC PANEL     Status: Abnormal   Collection Time    09/27/12  5:04 PM      Result Value Range   Sodium 138  135 - 145 mEq/L   Potassium 3.8  3.5 - 5.1 mEq/L   Chloride 101  96 - 112 mEq/L   CO2 27  19 - 32 mEq/L   Glucose, Bld 166 (*) 70 - 99 mg/dL   BUN 13  6 - 23 mg/dL   Creatinine, Ser 8.65  0.50 - 1.10 mg/dL   Calcium 9.0  8.4 - 78.4 mg/dL   GFR calc non Af Amer >90  >90 mL/min   GFR calc Af Amer >90  >90 mL/min  MAGNESIUM     Status: None   Collection Time    09/27/12  5:04 PM      Result Value Range   Magnesium 2.0  1.5 - 2.5 mg/dL  PHOSPHORUS     Status: None   Collection Time    09/27/12  5:04 PM      Result Value Range   Phosphorus 4.4  2.3 - 4.6 mg/dL  TROPONIN I     Status: Abnormal   Collection Time    09/27/12  5:27 PM      Result Value Range   Troponin I 3.73 (*) <0.30 ng/mL  GLUCOSE, CAPILLARY     Status: Abnormal   Collection Time    09/27/12  6:50 PM      Result Value Range   Glucose-Capillary 112 (*) 70 - 99 mg/dL   Comment 1 Notify RN    URINALYSIS, ROUTINE W REFLEX MICROSCOPIC     Status: None   Collection Time    09/27/12   6:59 PM      Result Value Range   Color, Urine YELLOW  YELLOW   APPearance CLEAR  CLEAR   Specific Gravity, Urine 1.010  1.005 - 1.030   pH 5.0  5.0 - 8.0   Glucose, UA NEGATIVE  NEGATIVE mg/dL   Hgb urine dipstick NEGATIVE  NEGATIVE   Bilirubin Urine NEGATIVE  NEGATIVE   Ketones, ur NEGATIVE  NEGATIVE mg/dL   Protein, ur NEGATIVE  NEGATIVE mg/dL   Urobilinogen, UA 0.2  0.0 - 1.0 mg/dL   Nitrite NEGATIVE  NEGATIVE   Leukocytes, UA NEGATIVE  NEGATIVE  MRSA PCR SCREENING  Status: None   Collection Time    09/27/12  7:03 PM      Result Value Range   MRSA by PCR NEGATIVE  NEGATIVE  BLOOD GAS, ARTERIAL     Status: Abnormal   Collection Time    09/27/12  7:50 PM      Result Value Range   FIO2 1.00     Delivery systems VENTILATOR     Mode PRESSURE REGULATED VOLUME CONTROL     VT 550     Rate 18     Peep/cpap 5.0     pH, Arterial 7.349 (*) 7.350 - 7.450   pCO2 arterial 47.3 (*) 35.0 - 45.0 mmHg   pO2, Arterial 88.3  80.0 - 100.0 mmHg   Bicarbonate 25.3 (*) 20.0 - 24.0 mEq/L   TCO2 26.8  0 - 100 mmol/L   Acid-Base Excess 0.4  0.0 - 2.0 mmol/L   O2 Saturation 95.6     Patient temperature 98.6     Collection site RIGHT RADIAL     Drawn by 10006     Sample type ARTERIAL     Allens test (pass/fail) PASS  PASS    CXR: Pulmonary edema.  EKG at 1938: Sinus tachycardia, rate 105. Low voltage. Subtle lateral injury pattern consistent with acute MI.  Repeat ECG at 20:31: NSR, rate 92. Unchanged from above.   Assessment/Plan: 52 y/o morbidly obese female with new ischemic ECG changes and mild troponin elevation in the peri-operative setting. Her clinical picture is consistent with a peri-operative MI. Her ECG localizes her injury to a relatively small lateral myocardial region, and she is demonstrating no evidence of hemodynamic or electric instability at this time. Given her immediately post-operative status, lack of apparent ongoing angina and stable current CV status, will plan for  a non-invasive approach for mgmt of acute MI at this time.   - serial troponins until peak. Serial ECGs.   - would initiate heparin gtt, ASA 325 mg now (and daily), dual anti-platelet therapy with clopidogrel 600 mg PO now and then 75 mg daily  - atorvastatin 80 mg daily   - aggressive beta-blockade  - check echo in AM  - likely ischemia evaluation before discharge if course remains stable. If patient develops angina, hemodynamic instability or ventricular arrhythmias, will plan for urgent cath  - will continue to follow   HERMANN, DANIEL 09/27/2012, 11:27 PM

## 2012-09-27 NOTE — Progress Notes (Signed)
EKG reviewed.  ST elevations suggesting possible lateral infarct.  Dr. Frederico Hamman is to call Cardiology.

## 2012-09-27 NOTE — Anesthesia Procedure Notes (Signed)
Procedure Name: Intubation Date/Time: 09/27/2012 11:23 AM Performed by: Darcey Nora B Pre-anesthesia Checklist: Patient identified, Emergency Drugs available, Suction available and Patient being monitored Patient Re-evaluated:Patient Re-evaluated prior to inductionOxygen Delivery Method: Circle system utilized Preoxygenation: Pre-oxygenation with 100% oxygen Intubation Type: IV induction Ventilation: Mask ventilation without difficulty Laryngoscope Size: Mac and 3 Grade View: Grade I Tube type: Oral Tube size: 7.5 mm Number of attempts: 1 Airway Equipment and Method: Stylet Placement Confirmation: ETT inserted through vocal cords under direct vision,  positive ETCO2 and breath sounds checked- equal and bilateral Secured at: 21 (cm at teeth) cm Tube secured with: Tape Dental Injury: Teeth and Oropharynx as per pre-operative assessment

## 2012-09-27 NOTE — Progress Notes (Addendum)
General surgery attending note:  This patient underwent a very uneventful laparoscopic ventral hernia repair today. Unexpectedly she had to be reintubated in the operating room, was extubated in the PACU, and was then reintubated  and now is sedated on the ventilator. Chest x-ray shows pulmonary edema and acute gastric distention. EKG shows sinus tachycardia but no other acute changes. Hemoglobin 14.0. WBC 8,400.  Chemistries pending.She has received IV Lasix and is beginning to diurese.  Exam reveals that she is sedated but moving all 4 extremities. Abdomen is obese but soft. Wounds looked fine,  no signs of bleeding  Assessment and plan: I greatly appreciate the intervention and advice by [facility] medicine  Postop laparoscopic ventral hernia repair with mesh. No obvious direct surgical complications at this time. Morbid obesity Acute post-op diffuse pulmonary edema and VDRF.   Specific pathophysiology unclear History pulmonary embolism, on chronic Coumadin . Being held in the perioperative period History of obstructive sleep apnea  Hypertension  Ventilator management and hemodynamic stabilization per CCM orders. Nasogastric tube to be inserted now for acute gastric distention Check labs in a.m. Hope respiratory failure will be transient.   Angelia Mould. Derrell Lolling, M.D., [facility] General and Minimally invasive Surgery Breast and Colorectal Surgery Office:   [phone] Pager:   [phone]

## 2012-09-27 NOTE — Preoperative (Signed)
Beta Blockers   Reason not to administer Beta Blockers:Not Applicable 

## 2012-09-27 NOTE — Interval H&P Note (Signed)
History and Physical Interval Note:  09/27/2012 11:05 AM  Breanna Wilkins  has presented today for surgery, with the diagnosis of incisional hernia  The goals and the various methods of treatment have been discussed with the patient and family. After consideration of risks, benefits and other options for treatment, the patient has consented to  Procedure(s): LAPAROSCOPIC INCISIONAL HERNIA possible open (N/A) INSERTION OF MESH (N/A) as a surgical intervention .  The patient's history has been reviewed, patient examined today, no change in status, stable for surgery.  I have reviewed the patient's chart and labs.  Questions were answered to the patient's satisfaction.     Ernestene Mention

## 2012-09-27 NOTE — Progress Notes (Signed)
Agitation   Restrains orders    Elevated troponin; patient post surgical intervention; concerning for ischemia; EKG ordered;

## 2012-09-27 NOTE — Anesthesia Postprocedure Evaluation (Signed)
  Anesthesia Post-op Note  Patient: Breanna Wilkins  Procedure(s) Performed: Procedure(s): LAPAROSCOPIC INCISIONAL HERNIA possible open (N/A) INSERTION OF MESH (N/A)  Patient Location: PACU  Anesthesia Type:General  Level of Consciousness: sedated  Airway and Oxygen Therapy: Patient re-intubated  Post-op Pain: mild  Post-op Assessment: Post-op Vital signs reviewed, Patient's Cardiovascular Status Stable and Respiratory Function Stable  Post-op Vital Signs: Reviewed and stable  Complications: Patient re-intubated. Pt failed extubation twice.  Once in the OR at end of surgery and once in the PACU.  Reintubated each time without problem. Residual anesthetic in combination with large body habitus likely responsible for respiratory compromise.

## 2012-09-27 NOTE — Progress Notes (Signed)
ANTICOAGULATION CONSULT NOTE - Initial Consult  Pharmacy Consult for heparin Indication: STEMI s/p ventral hernia repair  Allergies  Allergen Reactions  . Aspirin     REACTION: Nausea (325mg )    Patient Measurements: Weight: 309 lb 4.9 oz (140.3 kg)   Vital Signs: Temp: 98.6 F (37 C) (03/10 2001) Temp src: Oral (03/10 2001) BP: 132/81 mmHg (03/10 1915) Pulse Rate: 97 (03/10 1924)  Labs:  Recent Labs  09/27/12 0922 09/27/12 1704 09/27/12 1727  HGB  --  14.0  --   HCT  --  43.1  --   PLT  --  274  --   LABPROT 13.8  --   --   INR 1.07  --   --   CREATININE  --  0.74  --   TROPONINI  --   --  3.73*    The CrCl is unknown because both a height and weight (above a minimum accepted value) are required for this calculation.   Medical History: Past Medical History  Diagnosis Date  . Shoulder pain   . Pulmonary embolus 01/16/10    on Coumadin indefinitely  . Abdominal hernia   . Hypertension   . Overweight   . Lung nodule 12/2009    Very small, left upper lobe by CT June, 2011, one-year followup was stable  . Shortness of breath   . Warfarin anticoagulation   . Ejection fraction     65-70%, vigorous function, echo, March, 2011  . CHF (congestive heart failure)   . OSA (obstructive sleep apnea)     CPAP    Medications:  Prescriptions prior to admission  Medication Sig Dispense Refill  . furosemide (LASIX) 40 MG tablet Take 40 mg by mouth daily.      Marland Kitchen HYDROcodone-acetaminophen (NORCO/VICODIN) 5-325 MG per tablet Take 1 tablet by mouth every 6 (six) hours as needed for pain.  30 tablet  0  . lisinopril (PRINIVIL,ZESTRIL) 20 MG tablet Take 40 mg by mouth daily.      . pravastatin (PRAVACHOL) 40 MG tablet Take 20 mg by mouth at bedtime.      Marland Kitchen warfarin (COUMADIN) 10 MG tablet Take 10 mg by mouth at bedtime.        Assessment: Breanna Wilkins is 52 yo F s/p laparoscopic ventral hernia repair today.  She had respiratory complications causing her to be reintubated  x 2 and she remains intubated and sedated.   Pharmacy has been consulted to start heparin for a new STEMI.  Her troponin is elevated at 3.73 and she has ST elevations on EKG.  She was on coumadin 10mg  daily PTA for hx PE.  Her last dose was Tuesday. Her INR today is 1.07.  Her CBC is wnl.  Her wt is 140 kg. Case d/w Dr. Herma Carson.   Goal of Therapy:  Heparin level 0.3-0.7 units/ml Monitor platelets by anticoagulation protocol: Yes   Plan:  Give 3000 units bolus x 1 Start heparin infusion at 1500 units/hr Check anti-Xa level in 6 hours and daily while on heparin Continue to monitor H&H and platelets Herby Abraham, Pharm.D. [phone] 09/27/2012 9:44 PM

## 2012-09-27 NOTE — Anesthesia Preprocedure Evaluation (Signed)
Anesthesia Evaluation  Patient identified by MRN, date of birth, ID band Patient awake    Reviewed: Allergy & Precautions, H&P , NPO status , Patient's Chart, lab work & pertinent test results  Airway Mallampati: II  Neck ROM: Full    Dental  (+) Teeth Intact and Missing   Pulmonary shortness of breath, sleep apnea ,  breath sounds clear to auscultation        Cardiovascular hypertension, + Peripheral Vascular Disease Rhythm:Regular Rate:Normal     Neuro/Psych    GI/Hepatic negative GI ROS, Neg liver ROS,   Endo/Other  Morbid obesity  Renal/GU negative Renal ROS     Musculoskeletal   Abdominal (+) + obese,   Peds  Hematology negative hematology ROS (+)   Anesthesia Other Findings   Reproductive/Obstetrics                           Anesthesia Physical Anesthesia Plan  ASA: II  Anesthesia Plan: General   Post-op Pain Management:    Induction: Intravenous  Airway Management Planned: LMA and Oral ETT  Additional Equipment:   Intra-op Plan:   Post-operative Plan: Extubation in OR  Informed Consent: I have reviewed the patients History and Physical, chart, labs and discussed the procedure including the risks, benefits and alternatives for the proposed anesthesia with the patient or authorized representative who has indicated his/her understanding and acceptance.   Dental advisory given  Plan Discussed with: CRNA and Surgeon  Anesthesia Plan Comments:         Anesthesia Quick Evaluation

## 2012-09-27 NOTE — Op Note (Signed)
Patient Name:           Breanna Wilkins   Date of Surgery:        09/27/2012  Pre op Diagnosis:      Incarcerated ventral incisional hernia  Post op Diagnosis:    Incarcerated ventral incisional hernia  Procedure:                 Laparoscopic lysis of adhesions to release incarcerated omentum, laparoscopic repair of ventral incisional hernia with 20 cm x 20 cm Parietex  PCO composite mesh.  Surgeon:                     Angelia Mould. Derrell Lolling, M.D., FACS  Assistant:                      None  Operative Indications:   Breanna Wilkins is a 52 y.o. female. She is referred by Dr. Carrolyn Meiers at the [facility]  internal medicine clinic. Dr. Arnaldo Natal manages her Coumadin.  The patient is here to talk to me about a symptomatic incisional hernia at the umbilicus. She had a laparoscopic hysterectomy in the past. She's noticed a hernia at her umbilicus for 6 years. It is getting bigger and now hurts to touch. She had a CT scan in April 2013 at which time she had a gastroenteritis and the hernia was noted as well and there was fat present within the hernia sac. Currently she has no GI symptoms, just more pain when she touches the hernia. On exam she has a hernia around and below her umbilicus with a hernia sac at least 6 cm. I cannot reduce the contents. The skin looks healthy. Past history is significant for morbid obesity, obstructive sleep apnea, hypertension, the laparoscopic hysterectomy. She has been on Coumadin for 2 years for a popliteal DVT and pulmonary embolism. There is no current plan to take her off of this. Her Coumadin is regulated by Dr. Aundria Rud in the [facility] internal medicine clinic.    Operative Findings:       There was an incarcerated hernia in the midline around the umbilicus and slightly below. This contained a lot of incarcerated omentum but there was no evidence of intestinal involvement. We were able to take all the adhesions down sharply with scissors and completely released this. We were  able to close the fascia was an endoscopically placed #1 Novafil sutures, and repair the hernia with a 20 cm diameter piece of mesh placed under the fascia and under the peritoneum.  Procedure in Detail:          Following the induction of general endotracheal anesthesia, intravenous antibiotics were given, a Foley catheter was inserted, the abdomen was prepped and draped in a sterile fashion, and a surgical time out was performed. 0.5% Marcaine with epinephrine was used as a local infiltration anesthetic. A 5 mm optical trocar was placed in the left subcostal region. Trocar entry was uneventful. Pneumoperitoneum was created. Video camera  was inserted with findings as described above. A 12 mm trocar was placed in the left flank and a 5 mm trocar placed in the left lower quadrant. After surveying the abdomen and pelvis and seeing no gross disease we took the adhesions down. There was a lot of omentum caught up in the hernia defect. The hernia defect was probably 5 or 6 cm in diameter. With traction I could pull the omentum down out of the hernia sac and  see where the adhesions were to the hernia sac. These were taken down under direct vision with scissors and delivered completely released. There was minimal bleeding. I had to cauterize one bleeder on the omentum. I made a small stab wound in the center of the hernia sac. Through the stab wound and using an Endo Close device and #1 Novafil sutures I placed 3 transverse interrupted sutures to close the fascia. After all 3 of these were placed the sutures were tied and  this closed the fascia in the midline. I then marked the edges of the hernia with a spinal needle. I found that if  I used a 20 cm x 20 cm piece of mesh I got 5-7 cm overlap in all directions. I brought the 25 x 20 cm piece of parietex PCO  composite mesh to the operative field. We released pneumoperitoneum. I cut the mesh slightly down in size. I then marked a template on the abdominal wall for the 6  suture fixation sites. I positioned the mesh carefully so as to have the rough side up toward the peritoneum and the smooth side down toward the intestine. I placed 6 interrupted #1 Novafil sutures in the mesh and tied them and left the tails long. The mesh was then moistened, rolled up and inserted into the abdominal cavity. The mesh was positioned carefully to match the marked template. At each of the 6 suture fixation sites I made a stab wound and using the Endo Close device I drew  the sutures up through the abdominal wall. I was careful to take a good bite of tissue. After this was done I lifted all the sutures up and the mesh was positioned very nicely without any redundancy. I tied all 6 sutures. I then used the SecureStrap device to further fix the mesh to the peritoneum and abdominal wall in a double crown technique. I used 65-70 firings of the SecureStrap in a  double crown technique. I was very careful not to leave any gaps at the perimeter of the mesh and I inspected this several times. There was no bleeding. The omentum looked good. The small bowel and colon looked good. We felt that the hernia repair was completed. The pneumoperitoneum was released and the trocars were removed. All the skin incisions were closed with subcuticular sutures of 4-0 Monocryl and Dermabond. The patient tolerated the procedure well and was taken to recovery in stable condition. EBL 10-20 cc. Counts correct. Complications none.     Angelia Mould. Derrell Lolling, M.D., FACS General and Minimally Invasive Surgery Breast and Colorectal Surgery  09/27/2012 1:03 PM

## 2012-09-27 NOTE — Consult Note (Signed)
PULMONARY  / CRITICAL CARE MEDICINE  Name: Breanna Wilkins MRN: [id number] DOB: [birth year]    ADMISSION DATE:  09/27/2012 CONSULTATION DATE:  09/27/12  REFERRING MD :  [facility]-Dr. Derrell Lolling.  CHIEF COMPLAINT:  VDRF.  BRIEF PATIENT DESCRIPTION: 52 year old with history of PE on coumadin, CHF and pulmonary nodule as mentioned below who presents to PCCM after a ventral hernia repair by [facility].  Patient was in pulmonary edema and hypotensive post op and decision was made to keep her intubated and move to the ICU.  PCCM was consulted for vent management and hemodynamic stabilization.  SIGNIFICANT EVENTS / STUDIES:  3/10 hernia repair and VDRF.  LINES / TUBES: PIV  CULTURES: None  ANTIBIOTICS: Surgical prophylaxis only per [facility].  PAST MEDICAL HISTORY :  Past Medical History  Diagnosis Date  . Shoulder pain   . Pulmonary embolus 01/16/10    on Coumadin indefinitely  . Abdominal hernia   . Hypertension   . Overweight   . Lung nodule 12/2009    Very small, left upper lobe by CT June, 2011, one-year followup was stable  . Shortness of breath   . Warfarin anticoagulation   . Ejection fraction     65-70%, vigorous function, echo, March, 2011  . CHF (congestive heart failure)   . OSA (obstructive sleep apnea)     CPAP   Past Surgical History  Procedure Laterality Date  . Abdominal hysterectomy  07/2002    laparoscopic assisted vaginal hysterectomy for menorrhagia, dysmenorrhea, anemia, fibroids  . Knee arthroscopy Left    Prior to Admission medications   Medication Sig Start Date End Date Taking? Authorizing Provider  furosemide (LASIX) 40 MG tablet Take 40 mg by mouth daily.   Yes Historical Provider, MD  HYDROcodone-acetaminophen (NORCO/VICODIN) 5-325 MG per tablet Take 1 tablet by mouth every 6 (six) hours as needed for pain. 08/31/12  Yes Mathis Dad, MD  lisinopril (PRINIVIL,ZESTRIL) 20 MG tablet Take 40 mg by mouth daily.   Yes Historical Provider, MD  pravastatin (PRAVACHOL) 40 MG  tablet Take 20 mg by mouth at bedtime.   Yes Historical Provider, MD  warfarin (COUMADIN) 10 MG tablet Take 10 mg by mouth at bedtime.   Yes Historical Provider, MD   Allergies  Allergen Reactions  . Aspirin     REACTION: Nausea (325mg )    FAMILY HISTORY:  Family History  Problem Relation Age of Onset  . Diabetes Father   . Diabetes Sister   . Diabetes Mother   . Deep vein thrombosis Sister   . Ovarian cancer Mother   . Bone cancer Maternal Grandmother    SOCIAL HISTORY:  reports that she has quit smoking. She does not have any smokeless tobacco history on file. She reports that she drinks about 3.6 ounces of alcohol per week. She reports that she does not use illicit drugs.  REVIEW OF SYSTEMS:  Unattainable, patient is sedated and intubated.  SUBJECTIVE: Sedated and intubated.  VITAL SIGNS: Temp:  [97.8 F (36.6 C)-97.9 F (36.6 C)] 97.8 F (36.6 C) (03/10 1340) Pulse Rate:  [82-132] 115 (03/10 1700) Resp:  [12-26] 16 (03/10 1700) BP: (106-190)/(63-124) 107/63 mmHg (03/10 1659) SpO2:  [65 %-100 %] 100 % (03/10 1700) FiO2 (%):  [40 %-100 %] 100 % (03/10 1638) Weight:  [140.3 kg (309 lb 4.9 oz)] 140.3 kg (309 lb 4.9 oz) (03/10 1445) HEMODYNAMICS:   VENTILATOR SETTINGS: Vent Mode:  [-] SIMV/PC/PS FiO2 (%):  [40 %-100 %] 100 % Set Rate:  [  12 bmp-18 bmp] 18 bmp Vt Set:  [650 mL-750 mL] 750 mL PEEP:  [5 cmH20] 5 cmH20 Pressure Support:  [10 cmH20] 10 cmH20 INTAKE / OUTPUT: Intake/Output     03/09 0701 - 03/10 0700 03/10 0701 - 03/11 0700   I.V. (mL/kg)  1400 (10)   Total Intake(mL/kg)  1400 (10)   Urine (mL/kg/hr)  300   Blood  50   Total Output   350   Net   +1050         PHYSICAL EXAMINATION: General:  Chronically ill appearing and obese female, NAD. Neuro:  Sedated and intubated, withdraws to pain. HEENT:  Apache/AT, PERRL, EOM-I and MMM. Cardiovascular:  RRR, Nl S1/S2, -M/R/G. Lungs:  Coarse BS diffusely with diffuse crackles. Abdomen:  Obese, soft, diffusely  tender, wound ok and -BS. Musculoskeletal:  -edema and -tenderness. Skin:  Intact.  LABS:  Recent Labs Lab 09/27/12 0922 09/27/12 1602  INR 1.07  --   PHART  --  7.201*  PCO2ART  --  66.9*  PO2ART  --  87.1   No results found for this basename: GLUCAP,  in the last 168 hours  CXR: pending.  ASSESSMENT / PLAN:  PULMONARY A: Diffuse pulmonary edema and VDRF post op. P:   - Full vent support. - Vent settings adjusted. - ABG ordered. - Will adjust vent settings pending ABG.  CARDIOVASCULAR A: Post op shock, likely propofol related, minimal blood loss. P:  - Change propofol to versed/fentanyl. - KVO IVF. - Lasix as ordered. - Full labs now.  RENAL A:  Renal function unknown at this time. P:   - Lasix as ordered (2 doses). - BMET now. - AM labs.  GASTROINTESTINAL A:  Hernia repair. P:   - Per surgery.  HEMATOLOGIC A:  No active issues.  CBC unknown. P:  - CBC now.  INFECTIOUS A:  No active issues. P:   - Surgical prophylaxis per primary.  ENDOCRINE A:  No history of diabetes.   P:   - Monitor.  NEUROLOGIC A:  Sedated and intubated. P:   - Continuous versed and fentanyl since propofol resulted in hypotension.  I have personally obtained a history, examined the patient, evaluated laboratory and imaging results, formulated the assessment and plan and placed orders.  CRITICAL CARE: The patient is critically ill with multiple organ systems failure and requires high complexity decision making for assessment and support, frequent evaluation and titration of therapies, application of advanced monitoring technologies and extensive interpretation of multiple databases. Critical Care Time devoted to patient care services described in this note is 40 minutes.   Alyson Reedy, M.D. Pulmonary and Critical Care Medicine [facility] Pager: [phone]  09/27/2012, 5:09 PM

## 2012-09-28 ENCOUNTER — Inpatient Hospital Stay (HOSPITAL_COMMUNITY): Payer: Medicare Other

## 2012-09-28 DIAGNOSIS — I214 Non-ST elevation (NSTEMI) myocardial infarction: Secondary | ICD-10-CM | POA: Diagnosis not present

## 2012-09-28 DIAGNOSIS — I319 Disease of pericardium, unspecified: Secondary | ICD-10-CM

## 2012-09-28 DIAGNOSIS — G4733 Obstructive sleep apnea (adult) (pediatric): Secondary | ICD-10-CM

## 2012-09-28 DIAGNOSIS — I219 Acute myocardial infarction, unspecified: Secondary | ICD-10-CM

## 2012-09-28 DIAGNOSIS — I2699 Other pulmonary embolism without acute cor pulmonale: Secondary | ICD-10-CM

## 2012-09-28 LAB — CBC
HCT: 45.3 % (ref 36.0–46.0)
Hemoglobin: 11.4 g/dL — ABNORMAL LOW (ref 12.0–15.0)
Hemoglobin: 15.2 g/dL — ABNORMAL HIGH (ref 12.0–15.0)
MCH: 27.7 pg (ref 26.0–34.0)
MCHC: 32.9 g/dL (ref 30.0–36.0)
MCV: 82.7 fL (ref 78.0–100.0)
RBC: 5.48 MIL/uL — ABNORMAL HIGH (ref 3.87–5.11)
WBC: 10.4 10*3/uL (ref 4.0–10.5)

## 2012-09-28 LAB — BLOOD GAS, ARTERIAL
Acid-Base Excess: 3.3 mmol/L — ABNORMAL HIGH (ref 0.0–2.0)
Bicarbonate: 27.5 mEq/L — ABNORMAL HIGH (ref 20.0–24.0)
Drawn by: 24487
FIO2: 1 %
O2 Saturation: 95.6 %
RATE: 18 resp/min
pO2, Arterial: 81.8 mmHg (ref 80.0–100.0)

## 2012-09-28 LAB — BASIC METABOLIC PANEL
CO2: 27 mEq/L (ref 19–32)
Calcium: 8.5 mg/dL (ref 8.4–10.5)
Calcium: 8.8 mg/dL (ref 8.4–10.5)
Creatinine, Ser: 0.8 mg/dL (ref 0.50–1.10)
GFR calc Af Amer: >90 mL/min (ref >90)
GFR calc non Af Amer: 78 mL/min — ABNORMAL LOW (ref >90)
Glucose, Bld: 136 mg/dL — ABNORMAL HIGH (ref 70–99)
Glucose, Bld: 100 mg/dL — ABNORMAL HIGH (ref 70–99)
Potassium: 5.1 mEq/L (ref 3.5–5.1)
Sodium: 138 mEq/L (ref 135–145)

## 2012-09-28 LAB — HEPARIN LEVEL (UNFRACTIONATED): Heparin Unfractionated: 0.32 IU/mL (ref 0.30–0.70)

## 2012-09-28 LAB — URINE CULTURE

## 2012-09-28 LAB — TROPONIN I: Troponin I: 2.92 ng/mL (ref <0.30)

## 2012-09-28 MED ORDER — CHLORHEXIDINE GLUCONATE 0.12 % MT SOLN
15.0000 mL | Freq: Two times a day (BID) | OROMUCOSAL | Status: DC
  Administered 2012-09-28 – 2012-10-07 (×16): 15 mL via OROMUCOSAL
  Filled 2012-09-28 (×20): qty 15

## 2012-09-28 MED ORDER — FUROSEMIDE 10 MG/ML IJ SOLN
40.0000 mg | Freq: Two times a day (BID) | INTRAMUSCULAR | Status: AC
  Administered 2012-09-28 – 2012-09-29 (×2): 40 mg via INTRAVENOUS
  Filled 2012-09-28 (×2): qty 4

## 2012-09-28 MED ORDER — BIOTENE DRY MOUTH MT LIQD
15.0000 mL | Freq: Four times a day (QID) | OROMUCOSAL | Status: DC
  Administered 2012-09-28 – 2012-10-07 (×29): 15 mL via OROMUCOSAL

## 2012-09-28 NOTE — Care Management Note (Signed)
    Page 1 of 2   10/07/2012     2:10:08 PM   CARE MANAGEMENT NOTE 10/07/2012  Patient:  Breanna Wilkins, Breanna Wilkins   Account Number:  [id number]  Date Initiated:  09/28/2012  Documentation initiated by:  [facility]  Subjective/Objective Assessment:   Post op hernia repair - post op pulm edema and left intubated.     Action/Plan:   Anticipated DC Date:  10/04/2012   Anticipated DC Plan:  HOME W HOME HEALTH SERVICES      DC Planning Services  CM consult      [facility] Choice  HOME HEALTH   Choice offered to / List presented to:  C-1 Patient        HH arranged  HH-2 PT  HH-3 OT      [facility] agency  [facility]   Status of service:  Completed, signed off Medicare Important Message given?   (If response is "NO", the following Medicare IM given date fields will be blank) Date Medicare IM given:   Date Additional Medicare IM given:    Discharge Disposition:  HOME W HOME HEALTH SERVICES  Per UR Regulation:  Reviewed for med. necessity/level of care/duration of stay  If discussed at Long Length of Stay Meetings, dates discussed:   10/05/2012    Comments:  Contact:  [name],Latonya Friend [phone] 10-07-12 [address], [place] [phone] CM did speak to pt and she is agreeable to [facility] services via [facility]. CM did make referral for services and SOC to begin within 24-48 hours post d/c. CM did make an appointment at the Internal Medicine for f/u appointment and PT/INR checks. No further needs from CM at this time.  10-06-12 [address], [place] [phone] Plan for cardiac cath today. CM will continue to monitor for disposition needs. Per PT recommendations plan is for [facility] PT/OT.   10/05/12 0902 Henrietta Mayo RN BSN MSN CCM Xferred to 2600.  Advancing diet, cardiac cath scheduled for Wednesday.  10-01-12 11:10am Avie Arenas, RNBSN [phone] Hope to extubate today - will need cath prior to discharge.  09-29-12 3pm Avie Arenas, RNBSN  [phone]  [phone] Post op MI - confirmed with friend that patient does have a cpap machine at home but doesn't wear consistently. Patient is still on vent - but awake alert and nodding head appropriatly.  09-28-12  1:30pm Avie Arenas, RNBSN [phone] Patient continues on vent - friend, [place] who she lives with and father are at bedside.  Identified self.  Friends states independent prior to surgery and does not work is on disability.  No needs identfied from friend or Dad prior to admission - plan for discharge back home.  CM wil continue to follow for progression.

## 2012-09-28 NOTE — Progress Notes (Signed)
ANTICOAGULATION CONSULT NOTE - Follow Up Consult  Pharmacy Consult for heparin Indication: STEMI s/p ventral hernia repair  Allergies  Allergen Reactions  . Aspirin     REACTION: Nausea (325mg )    Patient Measurements: Height: 5\' 7"  (170.2 cm) Weight: 307 lb 1.6 oz (139.3 kg) IBW/kg (Calculated) : 61.6 Heparin Dosing Weight: 95.7 kg  Vital Signs: Temp: 98.3 F (36.8 C) (03/11 1520) Temp src: Oral (03/11 1520) BP: 117/66 mmHg (03/11 2100) Pulse Rate: 105 (03/11 2100)  Labs:  Recent Labs  09/27/12 0922  09/27/12 1704 09/27/12 1727 09/27/12 2228 09/28/12 0338 09/28/12 0445 09/28/12 1320 09/28/12 1545 09/28/12 1740 09/28/12 2125  HGB  --   < > 14.0  --   --  11.4*  --   --  15.2*  --   --   HCT  --   --  43.1  --   --  34.7*  --   --  45.3  --   --   PLT  --   --  274  --   --  207  --   --  244  --   --   LABPROT 13.8  --   --   --   --   --   --   --   --   --   --   INR 1.07  --   --   --   --   --   --   --   --   --   --   HEPARINUNFRC  --   --   --   --   --   --  0.36 0.19*  --   --  0.32  CREATININE  --   --  0.74  --   --   --  0.80  --   --  0.84  --   TROPONINI  --   --   --  3.73* 4.56*  --  2.92*  --   --   --   --   < > = values in this interval not displayed.  Estimated Creatinine Clearance: 113.3 ml/min (by C-G formula based on Cr of 0.84).   Medications:  Scheduled:  . antiseptic oral rinse  15 mL Mouth Rinse QID  . aspirin  300 mg Rectal Daily  . [COMPLETED]  ceFAZolin (ANCEF) IV  3 g Intravenous Q6H  . chlorhexidine  15 mL Mouth Rinse BID  . [EXPIRED] etomidate      . furosemide  40 mg Intravenous Q12H  . [EXPIRED] HYDROmorphone      . [EXPIRED] lidocaine (cardiac) 100 mg/29ml      . metoprolol  5 mg Intravenous Q6H  . pantoprazole (PROTONIX) IV  40 mg Intravenous Q24H  . [EXPIRED] rocuronium      . simvastatin  20 mg Oral q1800  . [EXPIRED] succinylcholine      . [DISCONTINUED] furosemide  40 mg Intravenous Q8H  . [DISCONTINUED]  lisinopril  40 mg Oral Daily   Infusions:  . sodium chloride 20 mL/hr at 09/27/12 2117  . heparin 1,700 Units/hr (09/28/12 1430)  . propofol 30 mcg/kg/min (09/28/12 2033)    Assessment: 52 yo female on heparin gtt for new STEMI s/p hernia repair on 3/10. Patient was on Coumadin PTA for history of PE/DVT (last dose on 3/4). Heparin level is now therapeutic  No infusion line issues or bleeding per RN.  Goal of Therapy:  Heparin level 0.3-0.7 units/ml Monitor platelets by anticoagulation  protocol: Yes   Plan:  Continue heparin infusion at 1700 units/hr Daily heparin level and CBC   Thank you for allowing pharmacy to be a part of this patients care team.  Lovenia Kim Pharm.D., BCPS Clinical Pharmacist 09/28/2012 10:46 PM Pager: [phone] Phone: [phone]

## 2012-09-28 NOTE — Progress Notes (Signed)
Ok to stick foot for labs

## 2012-09-28 NOTE — Progress Notes (Signed)
Dr. Chase Picket with cardiology at patients bedside aware of elevated troponin levels. Patient denies CP at this time. EKG done at bedside.  Will continue to monitor patient

## 2012-09-28 NOTE — Progress Notes (Signed)
PULMONARY  / CRITICAL CARE MEDICINE  Name: Breanna Wilkins MRN: [id number] DOB: 11/17/1960    ADMISSION DATE:  09/27/2012 CONSULTATION DATE:  09/27/12  REFERRING MD :  [facility]-Dr. Derrell Lolling.  CHIEF COMPLAINT:  VDRF.  BRIEF PATIENT DESCRIPTION: 52 year old with history of PE on coumadin, CHF and pulmonary nodule as mentioned below who presents to PCCM after a ventral hernia repair by [facility].  Patient was in pulmonary edema and hypotensive post op and decision was made to keep her intubated and move to the ICU.  PCCM was consulted for vent management and hemodynamic stabilization.  SIGNIFICANT EVENTS / STUDIES:  3/10 hernia repair and VDRF.  LINES / TUBES: PIV  CULTURES: None  ANTIBIOTICS: Surgical prophylaxis only per [facility].  SUBJECTIVE:  Awake, c/o some abd pain  Intubated, heparin gtt initiated 3/10  VITAL SIGNS: Temp:  [97 F (36.1 C)-100 F (37.8 C)] 99.7 F (37.6 C) (03/11 0734) Pulse Rate:  [91-132] 101 (03/11 1000) Resp:  [0-27] 19 (03/11 1000) BP: (104-190)/(55-124) 109/68 mmHg (03/11 1000) SpO2:  [65 %-100 %] 93 % (03/11 1000) FiO2 (%):  [40 %-100 %] 60 % (03/11 1000) Weight:  [139.3 kg (307 lb 1.6 oz)-140.3 kg (309 lb 4.9 oz)] 139.3 kg (307 lb 1.6 oz) (03/11 0600) HEMODYNAMICS:   VENTILATOR SETTINGS: Vent Mode:  [-] PRVC FiO2 (%):  [40 %-100 %] 60 % Set Rate:  [12 bmp-18 bmp] 18 bmp Vt Set:  [550 mL-750 mL] 550 mL PEEP:  [5 cmH20] 5 cmH20 Pressure Support:  [10 cmH20] 10 cmH20 Plateau Pressure:  [25 cmH20-38 cmH20] 30 cmH20 INTAKE / OUTPUT: Intake/Output     03/10 0701 - 03/11 0700 03/11 0701 - 03/12 0700   I.V. (mL/kg) 2034.4 (14.6) 170.9 (1.2)   IV Piggyback 100 50   Total Intake(mL/kg) 2134.4 (15.3) 220.9 (1.6)   Urine (mL/kg/hr) 1525 235 (0.4)   Blood 50    Total Output 1575 235   Net +559.4 -14.1         PHYSICAL EXAMINATION: General:  Chronically ill appearing and obese female, NAD. Neuro:  Awake and following commands HEENT:  [place]/AT, PERRL, EOM-I  and MMM. Cardiovascular:  RRR, Nl S1/S2, -M/R/G. Lungs:  Coarse BS diffusely with diffuse crackles. Abdomen:  Obese, soft, diffusely tender, wound ok and -BS. Musculoskeletal:  -edema and -tenderness. Skin:  Intact.  LABS:  Recent Labs Lab 09/27/12 0922 09/27/12 1602 09/27/12 1704 09/27/12 1727 09/27/12 1950 09/27/12 2228 09/28/12 0338 09/28/12 0430 09/28/12 0445  HGB  --   --  14.0  --   --   --  11.4*  --   --   WBC  --   --  8.4  --   --   --  10.4  --   --   PLT  --   --  274  --   --   --  207  --   --   NA  --   --  138  --   --   --   --   --  137  K  --   --  3.8  --   --   --   --   --  3.8  CL  --   --  101  --   --   --   --   --  101  CO2  --   --  27  --   --   --   --   --  27  GLUCOSE  --   --  166*  --   --   --   --   --  136*  BUN  --   --  13  --   --   --   --   --  12  CREATININE  --   --  0.74  --   --   --   --   --  0.80  CALCIUM  --   --  9.0  --   --   --   --   --  8.5  MG  --   --  2.0  --   --   --   --   --  1.6  PHOS  --   --  4.4  --   --   --   --   --  2.8  INR 1.07  --   --   --   --   --   --   --   --   TROPONINI  --   --   --  3.73*  --  4.56*  --   --  2.92*  PHART  --  7.201*  --   --  7.349*  --   --  7.410  --   PCO2ART  --  66.9*  --   --  47.3*  --   --  44.6  --   PO2ART  --  87.1  --   --  88.3  --   --  81.8  --     Recent Labs Lab 09/27/12 1850  GLUCAP 112*    CXR: pending.  ASSESSMENT / PLAN:  PULMONARY A: Diffuse pulmonary edema and VDRF post op. P:   - continue current vent support - assess for possible SBT when FiO2/PEEP needs decreased and CXR improved.  - will likely need T-piece prior to extubation  CARDIOVASCULAR A: Post op shock, likely propofol related, minimal blood loss > resolved Acute MI Acute cardiogenic pulm edema P:  - KVO IVF. - received lasix 80mg  x 3 doses; continue q12h and follow S Cr, BMP - appreciate cardiology assistance, received ASA, heparin gtt, metoprolol  RENAL A:   P:    - follow BMET, UOP  GASTROINTESTINAL A:  Hernia repair. P:   - Plans per surgery  HEMATOLOGIC A:  No active issues.  P:  - follow CBC  INFECTIOUS A:  No active issues. P:   - Surgical prophylaxis, no other indication abx at this time  ENDOCRINE A:  No history of diabetes.   P:   - Monitor.  NEUROLOGIC A:  Sedated and intubated. P:   - Continuous propofol + fentanyl  I have personally obtained a history, examined the patient, evaluated laboratory and imaging results, formulated the assessment and plan and placed orders.  CRITICAL CARE: The patient is critically ill with multiple organ systems failure and requires high complexity decision making for assessment and support, frequent evaluation and titration of therapies, application of advanced monitoring technologies and extensive interpretation of multiple databases. Critical Care Time devoted to patient care services described in this note is 30 minutes.   Levy Pupa, MD, PhD 09/28/2012, 12:16 PM  Pulmonary and Critical Care [phone] or if no answer [phone]

## 2012-09-28 NOTE — Progress Notes (Signed)
1 Day Post-Op  Subjective: Repeat EKG showed lateral ST changes, troponins were elevated, and  cardiology feels that she had a lateral STEMI.  They elected for noninvasive approach with heparin drip, aggressive beta blockade, and patient is currently doing well. She remains ventilator dependent but is is comfortable. Denies chest pain or dyspnea. Denies abdominal pain.  Stable on vent, but still requiring high FiO2. Minimal secretions. Chest x-ray shows pulmonary edema.  NG in place due to acute gastric distention post re-intubation.  Hemoglobin 11.4 at 3 AM. Glucose 136.Be that essentially normal.  Objective: Vital signs in last 24 hours: Temp:  [97 F (36.1 C)-100 F (37.8 C)] 100 F (37.8 C) (03/11 0428) Pulse Rate:  [82-132] 91 (03/11 0600) Resp:  [0-27] 22 (03/11 0600) BP: (104-190)/(55-124) 104/69 mmHg (03/11 0600) SpO2:  [65 %-100 %] 100 % (03/11 0600) FiO2 (%):  [40 %-100 %] 100 % (03/11 0400) Weight:  [307 lb 1.6 oz (139.3 kg)-309 lb 4.9 oz (140.3 kg)] 307 lb 1.6 oz (139.3 kg) (03/11 0600)    Intake/Output from previous day: 03/10 0701 - 03/11 0700 In: 2074.1 [I.V.:1974.1; IV Piggyback:100] Out: 1575 [Urine:1525; Blood:50] Intake/Output this shift: Total I/O In: 657.6 [I.V.:557.6; IV Piggyback:100] Out: 825 [Urine:825]  General appearance: sedated on bed. Arousable. Open his eyes. Entrance questions appropriately. Resp: coarse breath sounds bilaterally. GI: morbidly obese. Soft. Wounds looked fine. Minimal tenderness.  Lab Results:  Results for orders placed during the hospital encounter of 09/27/12 (from the past 24 hour(s))  PROTIME-INR     Status: None   Collection Time    09/27/12  9:22 AM      Result Value Range   Prothrombin Time 13.8  11.6 - 15.2 seconds   INR 1.07  0.00 - 1.49  BLOOD GAS, ARTERIAL     Status: Abnormal   Collection Time    09/27/12  4:02 PM      Result Value Range   FIO2 100.00     Delivery systems VENTILATOR     Mode SYNCRONIZED  INTERMITTENT MANDATORY VENTILATION     VT 650     Rate 16     Peep/cpap 5.0     pH, Arterial 7.201 (*) 7.350 - 7.450   pCO2 arterial 66.9 (*) 35.0 - 45.0 mmHg   pO2, Arterial 87.1  80.0 - 100.0 mmHg   Bicarbonate 25.2 (*) 20.0 - 24.0 mEq/L   TCO2 27.3  0 - 100 mmol/L   Acid-base deficit 1.9  0.0 - 2.0 mmol/L   O2 Saturation 93.2     Patient temperature 98.6     Collection site LEFT RADIAL     Drawn by [phone]     Sample type ARTERIAL DRAW     Allens test (pass/fail) PASS  PASS  CBC WITH DIFFERENTIAL     Status: Abnormal   Collection Time    09/27/12  5:04 PM      Result Value Range   WBC 8.4  4.0 - 10.5 K/uL   RBC 5.00  3.87 - 5.11 MIL/uL   Hemoglobin 14.0  12.0 - 15.0 g/dL   HCT 60.4  54.0 - 98.1 %   MCV 86.2  78.0 - 100.0 fL   MCH 28.0  26.0 - 34.0 pg   MCHC 32.5  30.0 - 36.0 g/dL   RDW 19.1 (*) 47.8 - 29.5 %   Platelets 274  150 - 400 K/uL   Neutrophils Relative 87 (*) 43 - 77 %   Neutro Abs 7.3  1.7 - 7.7 K/uL   Lymphocytes Relative 7 (*) 12 - 46 %   Lymphs Abs 0.6 (*) 0.7 - 4.0 K/uL   Monocytes Relative 5  3 - 12 %   Monocytes Absolute 0.4  0.1 - 1.0 K/uL   Eosinophils Relative 0  0 - 5 %   Eosinophils Absolute 0.0  0.0 - 0.7 K/uL   Basophils Relative 0  0 - 1 %   Basophils Absolute 0.0  0.0 - 0.1 K/uL  BASIC METABOLIC PANEL     Status: Abnormal   Collection Time    09/27/12  5:04 PM      Result Value Range   Sodium 138  135 - 145 mEq/L   Potassium 3.8  3.5 - 5.1 mEq/L   Chloride 101  96 - 112 mEq/L   CO2 27  19 - 32 mEq/L   Glucose, Bld 166 (*) 70 - 99 mg/dL   BUN 13  6 - 23 mg/dL   Creatinine, Ser 6.57  0.50 - 1.10 mg/dL   Calcium 9.0  8.4 - 84.6 mg/dL   GFR calc non Af Amer >90  >90 mL/min   GFR calc Af Amer >90  >90 mL/min  MAGNESIUM     Status: None   Collection Time    09/27/12  5:04 PM      Result Value Range   Magnesium 2.0  1.5 - 2.5 mg/dL  PHOSPHORUS     Status: None   Collection Time    09/27/12  5:04 PM      Result Value Range   Phosphorus  4.4  2.3 - 4.6 mg/dL  TROPONIN I     Status: Abnormal   Collection Time    09/27/12  5:27 PM      Result Value Range   Troponin I 3.73 (*) <0.30 ng/mL  GLUCOSE, CAPILLARY     Status: Abnormal   Collection Time    09/27/12  6:50 PM      Result Value Range   Glucose-Capillary 112 (*) 70 - 99 mg/dL   Comment 1 Notify RN    URINALYSIS, ROUTINE W REFLEX MICROSCOPIC     Status: None   Collection Time    09/27/12  6:59 PM      Result Value Range   Color, Urine YELLOW  YELLOW   APPearance CLEAR  CLEAR   Specific Gravity, Urine 1.010  1.005 - 1.030   pH 5.0  5.0 - 8.0   Glucose, UA NEGATIVE  NEGATIVE mg/dL   Hgb urine dipstick NEGATIVE  NEGATIVE   Bilirubin Urine NEGATIVE  NEGATIVE   Ketones, ur NEGATIVE  NEGATIVE mg/dL   Protein, ur NEGATIVE  NEGATIVE mg/dL   Urobilinogen, UA 0.2  0.0 - 1.0 mg/dL   Nitrite NEGATIVE  NEGATIVE   Leukocytes, UA NEGATIVE  NEGATIVE  MRSA PCR SCREENING     Status: None   Collection Time    09/27/12  7:03 PM      Result Value Range   MRSA by PCR NEGATIVE  NEGATIVE  BLOOD GAS, ARTERIAL     Status: Abnormal   Collection Time    09/27/12  7:50 PM      Result Value Range   FIO2 1.00     Delivery systems VENTILATOR     Mode PRESSURE REGULATED VOLUME CONTROL     VT 550     Rate 18     Peep/cpap 5.0     pH, Arterial 7.349 (*) 7.350 -  7.450   pCO2 arterial 47.3 (*) 35.0 - 45.0 mmHg   pO2, Arterial 88.3  80.0 - 100.0 mmHg   Bicarbonate 25.3 (*) 20.0 - 24.0 mEq/L   TCO2 26.8  0 - 100 mmol/L   Acid-Base Excess 0.4  0.0 - 2.0 mmol/L   O2 Saturation 95.6     Patient temperature 98.6     Collection site RIGHT RADIAL     Drawn by [phone]     Sample type ARTERIAL     Allens test (pass/fail) PASS  PASS  TROPONIN I     Status: Abnormal   Collection Time    09/27/12 10:28 PM      Result Value Range   Troponin I 4.56 (*) <0.30 ng/mL  CBC     Status: Abnormal   Collection Time    09/28/12  3:38 AM      Result Value Range   WBC 10.4  4.0 - 10.5 K/uL   RBC  4.24  3.87 - 5.11 MIL/uL   Hemoglobin 11.4 (*) 12.0 - 15.0 g/dL   HCT 78.2 (*) 95.6 - 21.3 %   MCV 81.8  78.0 - 100.0 fL   MCH 26.9  26.0 - 34.0 pg   MCHC 32.9  30.0 - 36.0 g/dL   RDW 08.6 (*) 57.8 - 46.9 %   Platelets 207  150 - 400 K/uL  BLOOD GAS, ARTERIAL     Status: Abnormal   Collection Time    09/28/12  4:30 AM      Result Value Range   FIO2 1.00     Delivery systems VENTILATOR     Mode PRESSURE REGULATED VOLUME CONTROL     VT 550     Rate 18     Peep/cpap 5.0     pH, Arterial 7.410  7.350 - 7.450   pCO2 arterial 44.6  35.0 - 45.0 mmHg   pO2, Arterial 81.8  80.0 - 100.0 mmHg   Bicarbonate 27.5 (*) 20.0 - 24.0 mEq/L   TCO2 28.8  0 - 100 mmol/L   Acid-Base Excess 3.3 (*) 0.0 - 2.0 mmol/L   O2 Saturation 95.6     Patient temperature 100.0     Collection site LEFT RADIAL     Drawn by [phone]     Sample type ARTERIAL     Allens test (pass/fail) PASS  PASS  HEPARIN LEVEL (UNFRACTIONATED)     Status: None   Collection Time    09/28/12  4:45 AM      Result Value Range   Heparin Unfractionated 0.36  0.30 - 0.70 IU/mL  TROPONIN I     Status: Abnormal   Collection Time    09/28/12  4:45 AM      Result Value Range   Troponin I 2.92 (*) <0.30 ng/mL  BASIC METABOLIC PANEL     Status: Abnormal   Collection Time    09/28/12  4:45 AM      Result Value Range   Sodium 137  135 - 145 mEq/L   Potassium 3.8  3.5 - 5.1 mEq/L   Chloride 101  96 - 112 mEq/L   CO2 27  19 - 32 mEq/L   Glucose, Bld 136 (*) 70 - 99 mg/dL   BUN 12  6 - 23 mg/dL   Creatinine, Ser 8.41  0.50 - 1.10 mg/dL   Calcium 8.5  8.4 - 32.4 mg/dL   GFR calc non Af Amer 83 (*) >90 mL/min   GFR calc  Af Amer >90  >90 mL/min  MAGNESIUM     Status: None   Collection Time    09/28/12  4:45 AM      Result Value Range   Magnesium 1.6  1.5 - 2.5 mg/dL  PHOSPHORUS     Status: None   Collection Time    09/28/12  4:45 AM      Result Value Range   Phosphorus 2.8  2.3 - 4.6 mg/dL     Studies/Results: @RISRSLT24 @  .  aspirin  300 mg Rectal Daily  .  ceFAZolin (ANCEF) IV  3 g Intravenous Q6H  . furosemide  40 mg Intravenous Q8H  . lisinopril  40 mg Oral Daily  . metoprolol  5 mg Intravenous Q6H  . pantoprazole (PROTONIX) IV  40 mg Intravenous Q24H  . simvastatin  20 mg Oral q1800     Assessment/Plan: s/p Procedure(s): LAPAROSCOPIC INCISIONAL HERNIA possible open INSERTION OF MESH  POD #1. Laparoscopic ventral hernia repair with mesh. No obvious surgical complications. Watch hemoglobin carefully due to heparin drip Continue NG suction for next 24 hours. Hopefully we can remove when she is extubated.  Acute perioperative lateral STEMI., without hemodynamic compromise. No chest pain. Cardiology has elected noninvasive approach.  Pulmonary edema, presumably secondary to myocardial infarction. Receiving Lasix and fluid restriction. This will obviously slowdown plans for the later weaning.  History pulmonary embolism, on chronic Coumadin As an outpatient.  History of sleep apnea  History hypertension-On Lopressor and lisinopril.  Morbid obesity  @PROBHOSP @  LOS: 1 day    Haywood M. Derrell Lolling, M.D., [facility] General and Minimally invasive Surgery Breast and Colorectal Surgery Office:   [phone] Pager:   [phone]  09/28/2012  . .prob

## 2012-09-28 NOTE — Progress Notes (Signed)
ANTICOAGULATION CONSULT NOTE  Pharmacy Consult for heparin Indication: STEMI s/p ventral hernia repair  Allergies  Allergen Reactions  . Aspirin     REACTION: Nausea (325mg )    Patient Measurements: Weight: 309 lb 4.9 oz (140.3 kg)   Vital Signs: Temp: 100 F (37.8 C) (03/11 0428) Temp src: Oral (03/11 0428) BP: 120/73 mmHg (03/11 0400) Pulse Rate: 107 (03/11 0400)  Labs:  Recent Labs  09/27/12 0922 09/27/12 1704 09/27/12 1727 09/27/12 2228 09/28/12 0338 09/28/12 0445  HGB  --  14.0  --   --  11.4*  --   HCT  --  43.1  --   --  34.7*  --   PLT  --  274  --   --  207  --   LABPROT 13.8  --   --   --   --   --   INR 1.07  --   --   --   --   --   HEPARINUNFRC  --   --   --   --   --  0.36  CREATININE  --  0.74  --   --   --  0.80  TROPONINI  --   --  3.73* 4.56*  --  2.92*    The CrCl is unknown because both a height and weight (above a minimum accepted value) are required for this calculation.  Assessment:  52 yo F s/p hernia repair with peri-operative STEMI for heparin   Goal of Therapy:  Heparin level 0.3-0.7 units/ml Monitor platelets by anticoagulation protocol: Yes   Plan:  Continue Heparin at current rate Recheck level in 6 hrs to verify  Geannie Risen, PharmD, BCPS  09/28/2012 5:48 AM

## 2012-09-28 NOTE — Progress Notes (Signed)
  Echocardiogram 2D Echocardiogram has been performed.  Ellender Hose A 09/28/2012, 11:54 AM

## 2012-09-28 NOTE — Progress Notes (Signed)
Patient ID: Breanna Wilkins, female   DOB: May 16, 1961, 52 y.o.   MRN: [id number]   SUBJECTIVE:  The patient was seen last night in consultation by our team with abnormal EKG changes compatible with an acute MI. Considering all issues it was most prudent to continue to treat the patient medically at this time. This has been done and the troponin peaked at 4.5. Hopefully this is consistent with only a relatively small MI. She is still intubated.   Filed Vitals:   09/28/12 0805 09/28/12 0900 09/28/12 0918 09/28/12 1000  BP:  112/67  109/68  Pulse: 104 101 102 101  Temp:      TempSrc:      Resp: 18 23 26 19   Height:      Weight:      SpO2: 96% 97% 95% 93%    Intake/Output Summary (Last 24 hours) at 09/28/12 1046 Last data filed at 09/28/12 1000  Gross per 24 hour  Intake 2355.3 ml  Output   1810 ml  Net  545.3 ml    LABS: Basic Metabolic Panel:  Recent Labs  08/65/78 1704 09/28/12 0445  NA 138 137  K 3.8 3.8  CL 101 101  CO2 27 27  GLUCOSE 166* 136*  BUN 13 12  CREATININE 0.74 0.80  CALCIUM 9.0 8.5  MG 2.0 1.6  PHOS 4.4 2.8   Liver Function Tests: No results found for this basename: AST, ALT, ALKPHOS, BILITOT, PROT, ALBUMIN,  in the last 72 hours No results found for this basename: LIPASE, AMYLASE,  in the last 72 hours CBC:  Recent Labs  09/27/12 1704 09/28/12 0338  WBC 8.4 10.4  NEUTROABS 7.3  --   HGB 14.0 11.4*  HCT 43.1 34.7*  MCV 86.2 81.8  PLT 274 207   Cardiac Enzymes:  Recent Labs  09/27/12 1727 09/27/12 2228 09/28/12 0445  TROPONINI 3.73* 4.56* 2.92*   BNP: No components found with this basename: POCBNP,  D-Dimer: No results found for this basename: DDIMER,  in the last 72 hours Hemoglobin A1C: No results found for this basename: HGBA1C,  in the last 72 hours Fasting Lipid Panel: No results found for this basename: CHOL, HDL, LDLCALC, TRIG, CHOLHDL, LDLDIRECT,  in the last 72 hours Thyroid Function Tests: No results found for this  basename: TSH, T4TOTAL, FREET3, T3FREE, THYROIDAB,  in the last 72 hours  RADIOLOGY: Chest 2 View  09/20/2012  *RADIOLOGY REPORT*  Clinical Data: Preop for hernia repair  CHEST - 2 VIEW  Comparison: 10/17/2010  Findings: Cardiomediastinal silhouette is stable.  No acute infiltrate or pleural effusion.  No pulmonary edema.  Mild degenerative changes thoracic spine.  IMPRESSION:  No active disease.  Mild degenerative changes thoracic spine.   Original Report Authenticated By: Natasha Mead, M.D.    Dg Chest Port 1 View  09/28/2012  *RADIOLOGY REPORT*  Clinical Data: Evaluate endotracheal tube position.  PORTABLE CHEST - 1 VIEW  Comparison: Chest x-ray of 09/27/2012.  Findings: An endotracheal tube is in place with tip 3.9 cm above the carina. A nasogastric tube is seen extending into the stomach, however, the tip of the nasogastric tube extends below the lower margin of the image.  Lung volumes are low.  Widespread interstitial and airspace disease throughout all aspects of the lungs bilaterally, with perihilar predominance, concerning for moderate to severe pulmonary edema.  Additional superimposed atelectasis in the left lower lobe.  Possible small left pleural effusion.  Mild cardiomegaly is unchanged. The patient is rotated  to the left on today's exam, resulting in distortion of the mediastinal contours and reduced diagnostic sensitivity and specificity for mediastinal pathology.  IMPRESSION: 1.  Support apparatus, as above. 2.  Findings, as above, concerning for congestive heart failure, possibly with small left pleural effusion and associated atelectasis in the left lower lobe.   Original Report Authenticated By: Trudie Reed, M.D.    Dg Chest Port 1 View  09/27/2012  *RADIOLOGY REPORT*  Clinical Data: Respiratory difficulty  PORTABLE CHEST - 1 VIEW  Comparison: 09/20/2012  Findings: Mild cardiomegaly.  Endotracheal tube placed.  Tip is 5.1 cm from the carina.  Central airspace disease and vascular  congestion.  Left basilar pulmonary opacity.  Gastric distention. No pneumothorax.  IMPRESSION: Central airspace disease and vascular congestion compatible with pulmonary edema.  Left basilar consolidation likely a combination of edema and volume loss.  Gastric distention.  The patient may benefit from an NG tube.   Original Report Authenticated By: Jolaine Click, M.D.     PHYSICAL EXAM  The patient is intubated. She does seem to respond appropriately to questions. She is markedly overweight. Neck exam is very limited. Lung exam reveals scattered rhonchi. Cardiac exam reveals S1 and S2. The abdomen is soft.   TELEMETRY: I have reviewed telemetry today September 28, 2012. There is sinus rhythm with sinus tachycardia   ASSESSMENT AND PLAN:    Warfarin anticoagulation     Warfarin is on hold for her surgical procedure. She is fully heparinize. At some point warfarin will have to be restarted carefully considering all other issues    Pulmonary embolus     There is a history of pulmonary embolus. The plan has been for her to be on Coumadin indefinitely.    Ventral hernia with obstruction     Patient has had a surgical procedure for this and is followed carefully by surgery.    Acute respiratory failure     Patient remains intubated after her surgical procedure. Her chest x-ray was consistent with CHF. She is receiving Lasix.    Hypotension, unspecified      Systolic blood pressures in the range of 105. I have put her lisinopril on hold.    Altered mental status    STEMI (ST elevation myocardial infarction)     The patient has had a perioperative MI. She is clinically stable at this point. Two-dimensional echo was ordered for today to see if we can assess her LV function further. She is receiving aspirin and IV heparin. Plavix would also be appropriate but is not being given at this time as the patient is n.p.o. The need for these medications will be complicated by the fact that the patient  definitely needs to be re\re coumadinized at some point. The most likely plan will be aspirin and heparin with the addition of Plavix when she can take it. Then plan for [facility] at catheterization before she goes home from the hospital. Decisions can then be made about the final choice of all of her medications.   [name] 09/28/2012 10:46 AM

## 2012-09-28 NOTE — Progress Notes (Signed)
ANTICOAGULATION CONSULT NOTE - Follow Up Consult  Pharmacy Consult for heparin Indication: STEMI s/p ventral hernia repair  Allergies  Allergen Reactions  . Aspirin     REACTION: Nausea (325mg )    Patient Measurements: Height: 5\' 7"  (170.2 cm) Weight: 307 lb 1.6 oz (139.3 kg) IBW/kg (Calculated) : 61.6 Heparin Dosing Weight: 95.7 kg  Vital Signs: Temp: 100 F (37.8 C) (03/11 1200) Temp src: Oral (03/11 1200) BP: 103/67 mmHg (03/11 1400) Pulse Rate: 97 (03/11 1400)  Labs:  Recent Labs  09/27/12 0922 09/27/12 1704 09/27/12 1727 09/27/12 2228 09/28/12 0338 09/28/12 0445 09/28/12 1320  HGB  --  14.0  --   --  11.4*  --   --   HCT  --  43.1  --   --  34.7*  --   --   PLT  --  274  --   --  207  --   --   LABPROT 13.8  --   --   --   --   --   --   INR 1.07  --   --   --   --   --   --   HEPARINUNFRC  --   --   --   --   --  0.36 0.19*  CREATININE  --  0.74  --   --   --  0.80  --   TROPONINI  --   --  3.73* 4.56*  --  2.92*  --     Estimated Creatinine Clearance: 119 ml/min (by C-G formula based on Cr of 0.8).   Medications:  Scheduled:  . antiseptic oral rinse  15 mL Mouth Rinse QID  . aspirin  300 mg Rectal Daily  . [COMPLETED]  ceFAZolin (ANCEF) IV  3 g Intravenous Q6H  . chlorhexidine  15 mL Mouth Rinse BID  . [EXPIRED] etomidate      . [COMPLETED] furosemide      . furosemide  40 mg Intravenous Q12H  . [COMPLETED] heparin  3,000 Units Intravenous Once  . [EXPIRED] HYDROmorphone      . [EXPIRED] lidocaine (cardiac) 100 mg/55ml      . metoprolol  5 mg Intravenous Q6H  . pantoprazole (PROTONIX) IV  40 mg Intravenous Q24H  . [EXPIRED] rocuronium      . simvastatin  20 mg Oral q1800  . [EXPIRED] succinylcholine      . [DISCONTINUED] chlorhexidine  1 application Topical Once  . [DISCONTINUED] enoxaparin  40 mg Subcutaneous Q24H  . [DISCONTINUED] enoxaparin (LOVENOX) injection  40 mg Subcutaneous Q24H  . [DISCONTINUED] furosemide  40 mg Intravenous Q8H  .  [DISCONTINUED] lisinopril  40 mg Oral Daily  . [DISCONTINUED] propofol  5-70 mcg/kg/min Intravenous To PACU   Infusions:  . sodium chloride 20 mL/hr at 09/27/12 2117  . heparin 1,500 Units/hr (09/27/12 2232)  . propofol 30.055 mcg/kg/min (09/28/12 1300)  . [DISCONTINUED] fentaNYL infusion INTRAVENOUS    . [DISCONTINUED] lactated ringers    . [DISCONTINUED] midazolam (VERSED) infusion    . [DISCONTINUED] propofol 10 mcg/kg/min (09/27/12 2017)    Assessment: 52 yo female on heparin gtt for new STEMI s/p hernia repair on 3/10. Patient was on Coumadin PTA for history of PE/DVT (last dose on 3/4). Heparin level is slightly subtherapeutic at 0.19.   No infusion line issues per RN. Patient does have some blood tinged secretions, but patient also has pulmonary edema. Slight decrease in H/H. Platelets are 207 (274 yesterday).   Goal of  Therapy:  Heparin level 0.3-0.7 units/ml Monitor platelets by anticoagulation protocol: Yes   Plan:  Increase heparin infusion to 1700 units/hr Check heparin level in 6 hours and daily while on heparin Continue to monitor H&H and platelets  Lillia Pauls, PharmD Clinical Pharmacist Pager: [phone] Phone: [phone] 09/28/2012 2:24 PM

## 2012-09-28 NOTE — Progress Notes (Signed)
19 Spoke with Dr. Chase Picket via telephone regarding patients troponin of 4.56. Per Dr. Chase Picket, will continue to treat medically with heparin at this time. PRN will continue to monitor patient.Patient's current rhythm is ST, with ST elevation. Patient denies CP, does not appear to be in distress, will continue to monitor  Dr. Darrick Penna also notified of troponin level and conversation with Dr. Chase Picket

## 2012-09-29 ENCOUNTER — Encounter (HOSPITAL_COMMUNITY): Payer: Self-pay | Admitting: General Surgery

## 2012-09-29 ENCOUNTER — Inpatient Hospital Stay (HOSPITAL_COMMUNITY): Payer: Medicare Other

## 2012-09-29 LAB — CBC
MCHC: 32.8 g/dL (ref 30.0–36.0)
Platelets: 250 10*3/uL (ref 150–400)
RDW: 16 % — ABNORMAL HIGH (ref 11.5–15.5)
WBC: 10.8 10*3/uL — ABNORMAL HIGH (ref 4.0–10.5)

## 2012-09-29 LAB — HEPARIN LEVEL (UNFRACTIONATED)
Heparin Unfractionated: 0.24 IU/mL — ABNORMAL LOW (ref 0.30–0.70)
Heparin Unfractionated: 0.14 IU/mL — ABNORMAL LOW (ref 0.30–0.70)

## 2012-09-29 LAB — BASIC METABOLIC PANEL
Chloride: 98 mEq/L (ref 96–112)
GFR calc Af Amer: 87 mL/min — ABNORMAL LOW (ref >90)
GFR calc non Af Amer: 75 mL/min — ABNORMAL LOW (ref >90)
Potassium: 3 mEq/L — ABNORMAL LOW (ref 3.5–5.1)
Sodium: 139 mEq/L (ref 135–145)

## 2012-09-29 LAB — MAGNESIUM: Magnesium: 2 mg/dL (ref 1.5–2.5)

## 2012-09-29 LAB — TRIGLYCERIDES: Triglycerides: 247 mg/dL — ABNORMAL HIGH (ref <150)

## 2012-09-29 MED ORDER — POTASSIUM CHLORIDE 10 MEQ/100ML IV SOLN
10.0000 meq | INTRAVENOUS | Status: AC
  Administered 2012-09-29 (×4): 10 meq via INTRAVENOUS

## 2012-09-29 MED ORDER — POTASSIUM CHLORIDE 10 MEQ/50ML IV SOLN: 10.0000 meq | INTRAVENOUS | Status: DC

## 2012-09-29 MED ORDER — FUROSEMIDE 10 MG/ML IJ SOLN
40.0000 mg | Freq: Three times a day (TID) | INTRAMUSCULAR | Status: DC
  Administered 2012-09-29 – 2012-09-30 (×3): 40 mg via INTRAVENOUS
  Filled 2012-09-29 (×6): qty 4

## 2012-09-29 MED ORDER — POTASSIUM CHLORIDE 20 MEQ/15ML (10%) PO LIQD
40.0000 meq | Freq: Once | ORAL | Status: AC
  Administered 2012-09-29: 40 meq via ORAL
  Filled 2012-09-29: qty 30

## 2012-09-29 MED ORDER — POTASSIUM PHOSPHATE DIBASIC 3 MMOLE/ML IV SOLN
30.0000 mmol | Freq: Once | INTRAVENOUS | Status: AC
  Administered 2012-09-29: 30 mmol via INTRAVENOUS
  Filled 2012-09-29: qty 10

## 2012-09-29 MED ORDER — HEPARIN BOLUS VIA INFUSION
1500.0000 [IU] | Freq: Once | INTRAVENOUS | Status: AC
  Administered 2012-09-29: 1500 [IU] via INTRAVENOUS
  Filled 2012-09-29: qty 1500

## 2012-09-29 MED ORDER — POTASSIUM CHLORIDE 10 MEQ/100ML IV SOLN
INTRAVENOUS | Status: AC
  Administered 2012-09-29: 10 meq via INTRAVENOUS
  Filled 2012-09-29: qty 500

## 2012-09-29 MED ORDER — WHITE PETROLATUM GEL
Status: AC
  Administered 2012-09-29
  Filled 2012-09-29: qty 5

## 2012-09-29 NOTE — Progress Notes (Signed)
2 Days Post-Op  Subjective: Arousable. Answers questions appropriately. Cooperative. Trying to pull endotracheal tube out.  Denies chest pain or air hunger. Denies nausea. Says she is hungry. Denies abdominal pain.  Remains ventilator dependent. FiO2 down to 50%. Chest x-ray pending. Low grade fever Hemodynamically stable. Good urine output.Morning laboratory pending. Hemoglobin last night 15.2. Seems stable without evidence of bleeding.  I greatly appreciate all of the interventions and care from critical care medicine and cardiology.  Objective: Vital signs in last 24 hours: Temp:  [98.3 F (36.8 C)-100.8 F (38.2 C)] 100.1 F (37.8 C) (03/12 0405) Pulse Rate:  [91-113] 100 (03/12 0500) Resp:  [18-29] 23 (03/12 0500) BP: (100-120)/(59-85) 120/72 mmHg (03/12 0500) SpO2:  [88 %-100 %] 94 % (03/12 0500) FiO2 (%):  [50 %-100 %] 50 % (03/12 0445) Weight:  [307 lb 1.6 oz (139.3 kg)] 307 lb 1.6 oz (139.3 kg) (03/11 0600)    Intake/Output from previous day: 03/11 0701 - 03/12 0700 In: 1448.8 [I.V.:1358.8; NG/GT:40; IV Piggyback:50] Out: 2075 [Urine:1775; Emesis/NG output:300] Intake/Output this shift: Total I/O In: 675.2 [I.V.:635.2; NG/GT:40] Out: 525 [Urine:525]  General appearance: sedated. Arousable. Opens eyes. Answers questions. Moves all 4 extremities. Resp: coarse breath sounds bilaterally. Minimal secretions. GI: abdomen obese. Soft. Minimal, appropriate incisional tenderness. Wounds looked fine. NG drainage bilious. NG output approximately 500 cc in 24 hours. Nothing excessive.  Lab Results:  Results for orders placed during the hospital encounter of 09/27/12 (from the past 24 hour(s))  HEPARIN LEVEL (UNFRACTIONATED)     Status: Abnormal   Collection Time    09/28/12  1:20 PM      Result Value Range   Heparin Unfractionated 0.19 (*) 0.30 - 0.70 IU/mL  CBC     Status: Abnormal   Collection Time    09/28/12  3:45 PM      Result Value Range   WBC 12.0 (*) 4.0 - 10.5  K/uL   RBC 5.48 (*) 3.87 - 5.11 MIL/uL   Hemoglobin 15.2 (*) 12.0 - 15.0 g/dL   HCT 16.1  09.6 - 04.5 %   MCV 82.7  78.0 - 100.0 fL   MCH 27.7  26.0 - 34.0 pg   MCHC 33.6  30.0 - 36.0 g/dL   RDW 40.9 (*) 81.1 - 91.4 %   Platelets 244  150 - 400 K/uL  BASIC METABOLIC PANEL     Status: Abnormal   Collection Time    09/28/12  5:40 PM      Result Value Range   Sodium 138  135 - 145 mEq/L   Potassium 5.1  3.5 - 5.1 mEq/L   Chloride 101  96 - 112 mEq/L   CO2 21  19 - 32 mEq/L   Glucose, Bld 100 (*) 70 - 99 mg/dL   BUN 12  6 - 23 mg/dL   Creatinine, Ser 7.82  0.50 - 1.10 mg/dL   Calcium 8.8  8.4 - 95.6 mg/dL   GFR calc non Af Amer 78 (*) >90 mL/min   GFR calc Af Amer >90  >90 mL/min  HEPARIN LEVEL (UNFRACTIONATED)     Status: None   Collection Time    09/28/12  9:25 PM      Result Value Range   Heparin Unfractionated 0.32  0.30 - 0.70 IU/mL     Studies/Results: @RISRSLT24 @  . antiseptic oral rinse  15 mL Mouth Rinse QID  . aspirin  300 mg Rectal Daily  . chlorhexidine  15 mL Mouth Rinse BID  .  metoprolol  5 mg Intravenous Q6H  . pantoprazole (PROTONIX) IV  40 mg Intravenous Q24H  . simvastatin  20 mg Oral q1800     Assessment/Plan: s/p Procedure(s): LAPAROSCOPIC INCISIONAL HERNIA possible open INSERTION OF MESH  POD #2. Laparoscopic ventral hernia repair with mesh. No obvious surgical complications.  Watch hemoglobin carefully due to heparin drip  Continue OG suction until extubated..  Acute perioperative lateral STEMI., without hemodynamic compromise. No chest pain.  Cardiology has elected noninvasive approach.  Pulmonary edema, presumably cardiogenic.Marland Kitchen Receiving Lasix and fluid restriction. This will obviously slowdown plans for  Weaning.  Fever. Suspect pulmonary origin, atalectasis Check CXR.   History pulmonary embolism, on chronic Coumadin As an outpatient.   History of sleep apnea   History hypertension-On Lopressor and lisinopril.   On PPI for ulcer  prophylaxis.  Morbid obesity   @PROBHOSP @  LOS: 2 days    Haywood M. Derrell Lolling, M.D., [facility] General and Minimally invasive Surgery Breast and Colorectal Surgery Office:   [phone] Pager:   [phone]  09/29/2012  . .prob

## 2012-09-29 NOTE — Progress Notes (Signed)
ANTICOAGULATION CONSULT NOTE - Follow Up Consult  Pharmacy Consult for heparin Indication: STEMI s/p ventral hernia repair  Allergies  Allergen Reactions  . Aspirin     REACTION: Nausea (325mg )    Patient Measurements: Height: 5\' 7"  (170.2 cm) Weight: 307 lb 1.6 oz (139.3 kg) IBW/kg (Calculated) : 61.6 Heparin Dosing Weight: 95.7 kg  Vital Signs: Temp: 100.3 F (37.9 C) (03/12 0744) Temp src: Oral (03/12 0744) BP: 109/65 mmHg (03/12 0700) Pulse Rate: 105 (03/12 0700)  Labs:  Recent Labs  09/27/12 0922  09/27/12 1704 09/27/12 1727 09/27/12 2228 09/28/12 0338  09/28/12 0445 09/28/12 1320 09/28/12 1545 09/28/12 1740 09/28/12 2125 09/29/12 0720  HGB  --   < > 14.0  --   --  11.4*  --   --   --  15.2*  --   --  12.9  HCT  --   < > 43.1  --   --  34.7*  --   --   --  45.3  --   --  39.3  PLT  --   < > 274  --   --  207  --   --   --  244  --   --  250  LABPROT 13.8  --   --   --   --   --   --   --   --   --   --   --   --   INR 1.07  --   --   --   --   --   --   --   --   --   --   --   --   HEPARINUNFRC  --   --   --   --   --   --   < > 0.36 0.19*  --   --  0.32 0.24*  CREATININE  --   --  0.74  --   --   --   --  0.80  --   --  0.84  --   --   TROPONINI  --   --   --  3.73* 4.56*  --   --  2.92*  --   --   --   --   --   < > = values in this interval not displayed.  Estimated Creatinine Clearance: 113.3 ml/min (by C-G formula based on Cr of 0.84).   Medications:  Scheduled:  . antiseptic oral rinse  15 mL Mouth Rinse QID  . aspirin  300 mg Rectal Daily  . [COMPLETED]  ceFAZolin (ANCEF) IV  3 g Intravenous Q6H  . chlorhexidine  15 mL Mouth Rinse BID  . [COMPLETED] furosemide  40 mg Intravenous Q12H  . heparin  1,500 Units Intravenous Once  . metoprolol  5 mg Intravenous Q6H  . pantoprazole (PROTONIX) IV  40 mg Intravenous Q24H  . simvastatin  20 mg Oral q1800  . [DISCONTINUED] furosemide  40 mg Intravenous Q8H  . [DISCONTINUED] lisinopril  40 mg Oral  Daily   Infusions:  . sodium chloride 20 mL/hr at 09/27/12 2117  . heparin 1,700 Units/hr (09/29/12 0519)  . propofol 15 mcg/kg/min (09/29/12 0981)    Assessment: 52 yo female on heparin gtt for new STEMI s/p hernia repair on 3/10. Patient was on Coumadin PTA for history of PE/DVT (last dose on 3/4). Heparin level is subtherapeutic at 0.24 after previously being at goal.   No infusion line issues per  RN. Still some blood tinged secretions after suctioning. No other bleeding noted. H/H and platelets are wnl.  Goal of Therapy:  Heparin level 0.3-0.7 units/ml Monitor platelets by anticoagulation protocol: Yes   Plan:  Give 1500 units bolus x 1 Increase heparin infusion to 1950 units/hr Check heparin level in 6 hours and daily while on heparin Continue to monitor H&H and platelets  Lillia Pauls, PharmD Clinical Pharmacist Pager: [phone] Phone: [phone] 09/29/2012 9:04 AM

## 2012-09-29 NOTE — Progress Notes (Addendum)
PULMONARY  / CRITICAL CARE MEDICINE  Name: MONACA WADAS MRN: [id number] DOB: 06-22-1961    ADMISSION DATE:  09/27/2012 CONSULTATION DATE:  09/27/12  REFERRING MD :  [facility]-Dr. Derrell Lolling.  CHIEF COMPLAINT:  VDRF.  BRIEF PATIENT DESCRIPTION: 52 year old with history of PE on coumadin, CHF and pulmonary nodule as mentioned below who presents to PCCM after a ventral hernia repair by [facility].  Patient was in pulmonary edema and hypotensive post op and decision was made to keep her intubated and move to the ICU.  PCCM was consulted for vent management and hemodynamic stabilization.  SIGNIFICANT EVENTS / STUDIES:  3/10 hernia repair and VDRF.  LINES / TUBES: ETT 3/10 >>  PIV   CULTURES: None  ANTIBIOTICS: Surgical prophylaxis only per CCS.  SUBJECTIVE:  Awake, c/o some abd pain  Wants to be extubated  VITAL SIGNS: Temp:  [98.3 F (36.8 C)-100.8 F (38.2 C)] 100.3 F (37.9 C) (03/12 0744) Pulse Rate:  [94-113] 101 (03/12 0900) Resp:  [18-29] 20 (03/12 0900) BP: (100-120)/(59-85) 114/71 mmHg (03/12 0900) SpO2:  [88 %-97 %] 90 % (03/12 0900) FiO2 (%):  [50 %-60 %] 50 % (03/12 0800) HEMODYNAMICS:   VENTILATOR SETTINGS: Vent Mode:  [-] PRVC FiO2 (%):  [50 %-60 %] 50 % Set Rate:  [18 bmp] 18 bmp Vt Set:  [550 mL] 550 mL PEEP:  [5 cmH20] 5 cmH20 Plateau Pressure:  [26 cmH20-29 cmH20] 29 cmH20 INTAKE / OUTPUT: Intake/Output     03/11 0701 - 03/12 0700 03/12 0701 - 03/13 0700   I.V. (mL/kg) 1500.2 (10.8) 131.4 (0.9)   NG/GT 40    IV Piggyback 50    Total Intake(mL/kg) 1590.2 (11.4) 131.4 (0.9)   Urine (mL/kg/hr) 2275 (0.7) 600 (1.5)   Emesis/NG output 500 (0.1) 50 (0.1)   Blood     Total Output 2775 650   Net -1184.8 -518.7         PHYSICAL EXAMINATION: General:  Chronically ill appearing and obese female, NAD. Neuro:  Awake and following commands HEENT:  Home/AT, PERRL, EOM-I and MMM. Cardiovascular:  RRR, Nl S1/S2, -M/R/G. Lungs:  Coarse BS diffusely with diffuse  crackles. Abdomen:  Obese, soft, diffusely tender, wound ok and -BS. Musculoskeletal:  -edema and -tenderness. Skin:  Intact.  LABS:  Recent Labs Lab 09/27/12 0922 09/27/12 1602  09/27/12 1704 09/27/12 1727 09/27/12 1950 09/27/12 2228 09/28/12 0338 09/28/12 0430 09/28/12 0445 09/28/12 1545 09/28/12 1740 09/29/12 0720  HGB  --   --   < > 14.0  --   --   --  11.4*  --   --  15.2*  --  12.9  WBC  --   --   < > 8.4  --   --   --  10.4  --   --  12.0*  --  10.8*  PLT  --   --   < > 274  --   --   --  207  --   --  244  --  250  NA  --   --   < > 138  --   --   --   --   --  137  --  138 139  K  --   --   < > 3.8  --   --   --   --   --  3.8  --  5.1 3.0*  CL  --   --   < > 101  --   --   --   --   --  101  --  101 98  CO2  --   --   < > 27  --   --   --   --   --  27  --  21 29  GLUCOSE  --   --   < > 166*  --   --   --   --   --  136*  --  100* 111*  BUN  --   --   < > 13  --   --   --   --   --  12  --  12 10  CREATININE  --   --   < > 0.74  --   --   --   --   --  0.80  --  0.84 0.87  CALCIUM  --   --   < > 9.0  --   --   --   --   --  8.5  --  8.8 8.8  MG  --   --   --  2.0  --   --   --   --   --  1.6  --   --  2.0  PHOS  --   --   --  4.4  --   --   --   --   --  2.8  --   --  1.8*  INR 1.07  --   --   --   --   --   --   --   --   --   --   --   --   TROPONINI  --   --   --   --  3.73*  --  4.56*  --   --  2.92*  --   --   --   PHART  --  7.201*  --   --   --  7.349*  --   --  7.410  --   --   --   --   PCO2ART  --  66.9*  --   --   --  47.3*  --   --  44.6  --   --   --   --   PO2ART  --  87.1  --   --   --  88.3  --   --  81.8  --   --   --   --   < > = values in this interval not displayed.  Recent Labs Lab 09/27/12 1850  GLUCAP 112*    CXR: pending.  ASSESSMENT / PLAN:  PULMONARY A: Pulmonary edema and VDRF post op. Probable OSA/OHS Hx PE/DVT P:   - continue current vent support - assess for possible SBT 3/12; Concerned that CXR still with significant  pulm edema (improved but present), may need further diuresis - will likely need T-piece prior to extubation - heparin gtt for PE, transition back to coumadin once stabilized  CARDIOVASCULAR A: Post op shock, likely propofol related, minimal blood loss > resolved Acute MI Acute cardiogenic pulm edema P:  - KVO IVF. - current lasix 40 q12h >> increase to 40mg  q8h 3/12 - follow S Cr, BMP - appreciate cardiology assistance, received ASA, heparin gtt, metoprolol  RENAL A:  Hypokalemia hypophosphatemia P:   - replace electrolytes - follow BMET, UOP  GASTROINTESTINAL A:  Hernia repair. P:   - Plans per surgery  HEMATOLOGIC A:  No active issues.  P:  -  follow CBC  INFECTIOUS A:  No active issues. P:   - Surgical prophylaxis, no other indication abx at this time  ENDOCRINE A:  No history of diabetes.   P:   - Monitor.  NEUROLOGIC A:  Sedated and intubated. P:   - Continuous propofol + fentanyl  I have personally obtained a history, examined the patient, evaluated laboratory and imaging results, formulated the assessment and plan and placed orders.  CRITICAL CARE: The patient is critically ill with multiple organ systems failure and requires high complexity decision making for assessment and support, frequent evaluation and titration of therapies, application of advanced monitoring technologies and extensive interpretation of multiple databases. Critical Care Time devoted to patient care services described in this note is 30 minutes.   Levy Pupa, MD, PhD 09/29/2012, 9:53 AM [facility] [phone] or if no answer [phone]

## 2012-09-29 NOTE — Progress Notes (Addendum)
ANTICOAGULATION CONSULT NOTE - Follow Up Consult  Pharmacy Consult for heparin Indication: STEMI s/p ventral hernia repair  Allergies  Allergen Reactions  . Aspirin     REACTION: Nausea (325mg )    Patient Measurements: Height: 5\' 7"  (170.2 cm) Weight: 307 lb 1.6 oz (139.3 kg) IBW/kg (Calculated) : 61.6 Heparin Dosing Weight: 95.7 kg  Vital Signs: Temp: 101.3 F (38.5 C) (03/12 1600) Temp src: Oral (03/12 1600) BP: 98/73 mmHg (03/12 1300) Pulse Rate: 99 (03/12 1300)  Labs:  Recent Labs  09/27/12 0922  09/27/12 1727 09/27/12 2228 09/28/12 0338 09/28/12 0445  09/28/12 1545 09/28/12 1740 09/28/12 2125 09/29/12 0720 09/29/12 1526  HGB  --   < >  --   --  11.4*  --   --  15.2*  --   --  12.9  --   HCT  --   < >  --   --  34.7*  --   --  45.3  --   --  39.3  --   PLT  --   < >  --   --  207  --   --  244  --   --  250  --   LABPROT 13.8  --   --   --   --   --   --   --   --   --   --   --   INR 1.07  --   --   --   --   --   --   --   --   --   --   --   HEPARINUNFRC  --   --   --   --   --  0.36  < >  --   --  0.32 0.24* 0.14*  CREATININE  --   < >  --   --   --  0.80  --   --  0.84  --  0.87  --   TROPONINI  --   --  3.73* 4.56*  --  2.92*  --   --   --   --   --   --   < > = values in this interval not displayed.  Estimated Creatinine Clearance: 109.4 ml/min (by C-G formula based on Cr of 0.87).   Medications:  Scheduled:  . antiseptic oral rinse  15 mL Mouth Rinse QID  . aspirin  300 mg Rectal Daily  . chlorhexidine  15 mL Mouth Rinse BID  . [COMPLETED] furosemide  40 mg Intravenous Q12H  . furosemide  40 mg Intravenous Q8H  . [COMPLETED] heparin  1,500 Units Intravenous Once  . metoprolol  5 mg Intravenous Q6H  . pantoprazole (PROTONIX) IV  40 mg Intravenous Q24H  . potassium chloride  10 mEq Intravenous Q1 Hr x 5  . [COMPLETED] potassium chloride  40 mEq Oral Once  . potassium phosphate IVPB (mmol)  30 mmol Intravenous Once  . simvastatin  20 mg Oral  q1800  . [DISCONTINUED] potassium chloride  10 mEq Intravenous Q1 Hr x 5   Infusions:  . sodium chloride 10 mL/hr at 09/29/12 1100  . heparin 1,950 Units/hr (09/29/12 0859)  . propofol 15 mcg/kg/min (09/29/12 1454)    Assessment: 52 yo female on heparin gtt for new STEMI s/p hernia repair on 3/10. Patient was on Coumadin PTA for history of PE/DVT (last dose on 3/4). Heparin level continue to be subtherapeutic at 0.14 despite heparin bolus and rate  increase.  No infusion line issues per RN. Patient is having bloody secretions from ET tube (worsended today compared to yesterday). No other bleeding noted. MD ok with increasing heparin. Will not bolus again given increase in bloody secretions. H/H and platelets are wnl.  Goal of Therapy:  Heparin level 0.3-0.7 units/ml Monitor platelets by anticoagulation protocol: Yes   Plan:  Increase heparin infusion to 2150 units/hr Check heparin level in 6 hours and daily while on heparin Continue to monitor H&H and platelets F/u secretions from ET tube  Lillia Pauls, PharmD Clinical Pharmacist Pager: [phone] Phone: [phone] 09/29/2012 4:17 PM

## 2012-09-29 NOTE — Progress Notes (Signed)
    Subjective:  The patient is intubated but alert. She denies chest pain or shortness of breath.  Objective:  Vital Signs in the last 24 hours: Temp:  [98.3 F (36.8 C)-100.8 F (38.2 C)] 100.2 F (37.9 C) (03/12 1158) Pulse Rate:  [90-113] 99 (03/12 1300) Resp:  [12-29] 26 (03/12 1300) BP: (98-120)/(59-85) 98/73 mmHg (03/12 1300) SpO2:  [88 %-97 %] 92 % (03/12 1300) FiO2 (%):  [50 %] 50 % (03/12 1300)  Intake/Output from previous day: 03/11 0701 - 03/12 0700 In: 1590.2 [I.V.:1500.2; NG/GT:40; IV Piggyback:50] Out: 2775 [Urine:2275; Emesis/NG output:500]  Physical Exam: Pt is alert and oriented, obese woman, on the ventilator. She is in no acute distress. HEENT: normal Neck: JVP - unable to visualize secondary to body habitus Lungs: Coarse breath sounds bilaterally CV: RRR with grade 2/6 systolic ejection murmur at the left sternal border Abd: soft, NT , obese Ext: Diffuse 1+ edema Skin: warm/dry no rash  Lab Results:  Recent Labs  09/28/12 1545 09/29/12 0720  WBC 12.0* 10.8*  HGB 15.2* 12.9  PLT 244 250    Recent Labs  09/28/12 1740 09/29/12 0720  NA 138 139  K 5.1 3.0*  CL 101 98  CO2 21 29  GLUCOSE 100* 111*  BUN 12 10  CREATININE 0.84 0.87    Recent Labs  09/27/12 2228 09/28/12 0445  TROPONINI 4.56* 2.92*    Cardiac Studies: 2D Echo: Study Conclusions  - Left ventricle: Apical images are very limited. The base of the LV moves. At the mid ventricle, there is severe hypokinesis of the inferior segment and the inferolateral segment. I can not assess any of the apical segments. EF is reduced. The cavity size was normal. Wall thickness was increased in a pattern of moderate LVH. - Aortic valve: Sclerosis without stenosis. - Right ventricle: There is hypokinesis of the apical aspect of the RV free wall. The cavity size was normal. - Pericardium, extracardiac: A small pericardial effusion was identified posterior to the  heart.  Tele: Sinus rhythm/sinus tachycardia heart rate currently about 100 beats per minute.  Assessment/Plan:  1. Lateral wall STEMI. The patient has stabilized. She does have new left ventricular segmental systolic dysfunction compared to previous echo. Her troponin has peaked at less than 5. She clearly has had an infarction, but her hemodynamics are stable and she is chest pain-free. Will continue with conservative management for now. She will need a cardiac catheterization when she is extubated and hemodynamically stable.  2. Acute mixed systolic and diastolic heart failure. Continue IV Lasix for diuresis. She is currently receiving this 3 times daily.  Her medications were reviewed. She is appropriately on aspirin, a statin drug, and a beta blocker. Will continue to follow as she is extubated with plans for cardiac catheterization when we are able to proceed with this. We will need to sort out her anticoagulation issues, and this decision will partly depend on findings at cardiac catheterization. If she can be treated medically, it may be best to continue with long-term anticoagulation for recurrent DVTs as well as a single antiplatelet drug.   Tonny Bollman, M.D. 09/29/2012, 3:07 PM

## 2012-09-30 ENCOUNTER — Inpatient Hospital Stay (HOSPITAL_COMMUNITY): Payer: Medicare Other

## 2012-09-30 LAB — CBC
HCT: 37.2 % (ref 36.0–46.0)
Hemoglobin: 12 g/dL (ref 12.0–15.0)
MCV: 84.2 fL (ref 78.0–100.0)
RBC: 4.42 MIL/uL (ref 3.87–5.11)
WBC: 12.4 10*3/uL — ABNORMAL HIGH (ref 4.0–10.5)

## 2012-09-30 LAB — COMPREHENSIVE METABOLIC PANEL
AST: 19 U/L (ref 0–37)
BUN: 12 mg/dL (ref 6–23)
CO2: 27 mEq/L (ref 19–32)
Chloride: 98 mEq/L (ref 96–112)
Creatinine, Ser: 0.91 mg/dL (ref 0.50–1.10)
GFR calc non Af Amer: 71 mL/min — ABNORMAL LOW (ref >90)
Glucose, Bld: 117 mg/dL — ABNORMAL HIGH (ref 70–99)
Total Bilirubin: 0.9 mg/dL (ref 0.3–1.2)

## 2012-09-30 LAB — URINALYSIS, ROUTINE W REFLEX MICROSCOPIC
Bilirubin Urine: NEGATIVE
Ketones, ur: NEGATIVE mg/dL
Nitrite: NEGATIVE
pH: 6 (ref 5.0–8.0)

## 2012-09-30 LAB — HEPARIN LEVEL (UNFRACTIONATED)
Heparin Unfractionated: 0.15 IU/mL — ABNORMAL LOW (ref 0.30–0.70)
Heparin Unfractionated: 0.32 IU/mL (ref 0.30–0.70)

## 2012-09-30 LAB — PHOSPHORUS: Phosphorus: 2.1 mg/dL — ABNORMAL LOW (ref 2.3–4.6)

## 2012-09-30 LAB — PRO B NATRIURETIC PEPTIDE: Pro B Natriuretic peptide (BNP): 2211 pg/mL — ABNORMAL HIGH (ref 0–125)

## 2012-09-30 MED ORDER — FUROSEMIDE 40 MG PO TABS: 40.0000 mg | ORAL_TABLET | Freq: Every day | ORAL | Status: DC

## 2012-09-30 MED ORDER — MORPHINE SULFATE 2 MG/ML IJ SOLN: 2.0000 mg | INTRAMUSCULAR | Status: DC | PRN

## 2012-09-30 MED ORDER — POTASSIUM PHOSPHATE DIBASIC 3 MMOLE/ML IV SOLN
30.0000 mmol | Freq: Once | INTRAVENOUS | Status: AC
  Administered 2012-09-30: 30 mmol via INTRAVENOUS
  Filled 2012-09-30: qty 10

## 2012-09-30 MED ORDER — OSMOLITE 1.2 CAL PO LIQD
1000.0000 mL | ORAL | Status: DC
  Administered 2012-09-30: 1000 mL
  Filled 2012-09-30 (×4): qty 1000

## 2012-09-30 MED ORDER — HEPARIN BOLUS VIA INFUSION
2000.0000 [IU] | Freq: Once | INTRAVENOUS | Status: AC
  Administered 2012-09-30: 2000 [IU] via INTRAVENOUS
  Filled 2012-09-30: qty 2000

## 2012-09-30 MED ORDER — POTASSIUM CHLORIDE 20 MEQ/15ML (10%) PO LIQD
40.0000 meq | ORAL | Status: AC
  Administered 2012-09-30 (×2): 40 meq via ORAL
  Filled 2012-09-30 (×2): qty 30

## 2012-09-30 MED ORDER — VANCOMYCIN HCL 10 G IV SOLR
2500.0000 mg | Freq: Once | INTRAVENOUS | Status: AC
  Administered 2012-09-30: 2500 mg via INTRAVENOUS
  Filled 2012-09-30: qty 2500

## 2012-09-30 MED ORDER — FUROSEMIDE 10 MG/ML IJ SOLN
80.0000 mg | Freq: Three times a day (TID) | INTRAMUSCULAR | Status: DC
  Administered 2012-09-30 – 2012-10-02 (×6): 80 mg via INTRAVENOUS
  Filled 2012-09-30 (×5): qty 8
  Filled 2012-09-30: qty 4
  Filled 2012-09-30 (×2): qty 8

## 2012-09-30 MED ORDER — HEPARIN BOLUS VIA INFUSION
2500.0000 [IU] | Freq: Once | INTRAVENOUS | Status: AC
  Administered 2012-09-30: 2500 [IU] via INTRAVENOUS
  Filled 2012-09-30: qty 2500

## 2012-09-30 MED ORDER — MAGNESIUM HYDROXIDE 400 MG/5ML PO SUSP
30.0000 mL | Freq: Once | ORAL | Status: AC
  Administered 2012-09-30: 30 mL
  Filled 2012-09-30: qty 30

## 2012-09-30 MED ORDER — VANCOMYCIN HCL 10 G IV SOLR
1500.0000 mg | Freq: Two times a day (BID) | INTRAVENOUS | Status: DC
  Administered 2012-10-01 – 2012-10-04 (×7): 1500 mg via INTRAVENOUS
  Filled 2012-09-30 (×9): qty 1500

## 2012-09-30 MED ORDER — PRO-STAT SUGAR FREE PO LIQD
30.0000 mL | ORAL | Status: DC
  Administered 2012-09-30 – 2012-10-01 (×6): 30 mL
  Filled 2012-09-30 (×13): qty 30

## 2012-09-30 MED ORDER — DEXTROSE 5 % IV SOLN
2.0000 g | Freq: Three times a day (TID) | INTRAVENOUS | Status: DC
  Administered 2012-09-30 – 2012-10-04 (×12): 2 g via INTRAVENOUS
  Filled 2012-09-30 (×16): qty 2

## 2012-09-30 NOTE — Progress Notes (Signed)
[facility] Nursing ICU Electrolyte Replacement Protocol  Patient Name: Breanna Wilkins DOB: 06-12-1961 MRN: [id number]  Date of Service  09/30/2012 K+ 3.4 Replaced per protocol  HPI/Events of Note    Recent Labs Lab 09/27/12 1704 09/28/12 0445 09/28/12 1740 09/29/12 0720 09/30/12 0345  NA 138 137 138 139 138  K 3.8 3.8 5.1 3.0* 3.4*  CL 101 101 101 98 98  CO2 27 27 21 29 27   GLUCOSE 166* 136* 100* 111* 117*  BUN 13 12 12 10 12   CREATININE 0.74 0.80 0.84 0.87 0.91  CALCIUM 9.0 8.5 8.8 8.8 8.8  MG 2.0 1.6  --  2.0  --   PHOS 4.4 2.8  --  1.8* 2.1*    Estimated Creatinine Clearance: 104.6 ml/min (by C-G formula based on Cr of 0.91).  Intake/Output     03/12 0701 - 03/13 0700   I.V. (mL/kg) 1055.6 (7.6)   IV Piggyback 500   Total Intake(mL/kg) 1555.6 (11.2)   Urine (mL/kg/hr) 2255 (0.7)   Emesis/NG output 150 (0)   Total Output 2405   Net -849.4        - I/O DETAILED x24h    Total I/O In: 460.2 [I.V.:460.2] Out: 720 [Urine:720] - I/O THIS SHIFT    ASSESSMENT   eICURN Interventions     ASSESSMENT: MAJOR ELECTROLYTE    Merita Norton 09/30/2012, 5:42 AM

## 2012-09-30 NOTE — Progress Notes (Signed)
PULMONARY  / CRITICAL CARE MEDICINE  Name: KIMBERL VIG MRN: [id number] DOB: [birth year]    ADMISSION DATE:  09/27/2012 CONSULTATION DATE:  09/27/12  REFERRING MD :  [facility]-Dr. Derrell Lolling.  CHIEF COMPLAINT:  VDRF.  BRIEF PATIENT DESCRIPTION: 52 year old with history of PE on coumadin, CHF and pulmonary nodule as mentioned below who presents to PCCM after a ventral hernia repair by [facility].  Patient was in pulmonary edema and hypotensive post op and decision was made to keep her intubated and move to the ICU.  PCCM was consulted for vent management and hemodynamic stabilization.  SIGNIFICANT EVENTS / STUDIES:  3/10 hernia repair and VDRF.  LINES / TUBES: ETT 3/10 >>  PIV   CULTURES: MRSA 3/10 >> negative Urine 3/10 >> negative Urine 3/13 >>  Blood 3/13 >>   ANTIBIOTICS: Surgical prophylaxis post-op  SUBJECTIVE:  Awake, participating in care Rapid shallow breathing on PSV 20 this am Note fever on 3/12 to 102.11F 1400cc net negative last 24h  VITAL SIGNS: Temp:  [100.1 F (37.8 C)-102.1 F (38.9 C)] 100.1 F (37.8 C) (03/13 0745) Pulse Rate:  [90-107] 99 (03/13 0800) Resp:  [12-31] 23 (03/13 0800) BP: (98-132)/(69-108) 126/71 mmHg (03/13 0800) SpO2:  [88 %-94 %] 94 % (03/13 0800) FiO2 (%):  [40 %-50 %] 50 % (03/13 0745) HEMODYNAMICS:   VENTILATOR SETTINGS: Vent Mode:  [-] PRVC FiO2 (%):  [40 %-50 %] 50 % Set Rate:  [18 bmp] 18 bmp Vt Set:  [550 mL] 550 mL PEEP:  [5 cmH20] 5 cmH20 Plateau Pressure:  [23 cmH20-28 cmH20] 25 cmH20 INTAKE / OUTPUT: Intake/Output     03/12 0701 - 03/13 0700 03/13 0701 - 03/14 0700   I.V. (mL/kg) 1243.5 (8.9) 10 (0.1)   NG/GT     IV Piggyback 500    Total Intake(mL/kg) 1743.5 (12.5) 10 (0.1)   Urine (mL/kg/hr) 2995 (0.9)    Emesis/NG output 150 (0)    Total Output 3145     Net -1401.5 +10         PHYSICAL EXAMINATION: General:  Chronically ill appearing and obese female, NAD. Neuro:  Awake and following commands HEENT:  [place]/AT, PERRL,  EOM-I and MMM. Cardiovascular:  RRR, Nl S1/S2, -M/R/G. Lungs:  Coarse BS diffusely with diffuse crackles. Abdomen:  Obese, soft, diffusely tender, wound ok and -BS. Musculoskeletal:  -edema and -tenderness. Skin:  Intact.  LABS:  Recent Labs Lab 09/27/12 0922 09/27/12 1602  09/27/12 1704 09/27/12 1727 09/27/12 1950 09/27/12 2228  09/28/12 0430 09/28/12 0445 09/28/12 1545 09/28/12 1740 09/29/12 0720 09/30/12 0345  HGB  --   --   --  14.0  --   --   --   < >  --   --  15.2*  --  12.9 12.0  WBC  --   --   --  8.4  --   --   --   < >  --   --  12.0*  --  10.8* 12.4*  PLT  --   --   --  274  --   --   --   < >  --   --  244  --  250 239  NA  --   --   < > 138  --   --   --   --   --  137  --  138 139 138  K  --   --   < > 3.8  --   --   --   --   --  3.8  --  5.1 3.0* 3.4*  CL  --   --   < > 101  --   --   --   --   --  101  --  101 98 98  CO2  --   --   < > 27  --   --   --   --   --  27  --  21 29 27   GLUCOSE  --   --   < > 166*  --   --   --   --   --  136*  --  100* 111* 117*  BUN  --   --   < > 13  --   --   --   --   --  12  --  12 10 12   CREATININE  --   --   < > 0.74  --   --   --   --   --  0.80  --  0.84 0.87 0.91  CALCIUM  --   --   < > 9.0  --   --   --   --   --  8.5  --  8.8 8.8 8.8  MG  --   --   --  2.0  --   --   --   --   --  1.6  --   --  2.0  --   PHOS  --   --   < > 4.4  --   --   --   --   --  2.8  --   --  1.8* 2.1*  AST  --   --   --   --   --   --   --   --   --   --   --   --   --  19  ALT  --   --   --   --   --   --   --   --   --   --   --   --   --  6  ALKPHOS  --   --   --   --   --   --   --   --   --   --   --   --   --  71  BILITOT  --   --   --   --   --   --   --   --   --   --   --   --   --  0.9  PROT  --   --   --   --   --   --   --   --   --   --   --   --   --  7.6  ALBUMIN  --   --   --   --   --   --   --   --   --   --   --   --   --  2.6*  INR 1.07  --   --   --   --   --   --   --   --   --   --   --   --   --   TROPONINI  --   --    --   --  3.73*  --  4.56*  --   --  2.92*  --   --   --   --   PROBNP  --   --   --   --   --   --   --   --   --   --   --   --   --  2211.0*  PHART  --  7.201*  --   --   --  7.349*  --   --  7.410  --   --   --   --   --   PCO2ART  --  66.9*  --   --   --  47.3*  --   --  44.6  --   --   --   --   --   PO2ART  --  87.1  --   --   --  88.3  --   --  81.8  --   --   --   --   --   < > = values in this interval not displayed.  Recent Labs Lab 09/27/12 1850  GLUCAP 112*    CXR: pending.  ASSESSMENT / PLAN:  PULMONARY A: Pulmonary edema and VDRF post op. Consider also HCAP (new fever 3/12) Probable OSA/OHS Hx PE/DVT P:   - continue current vent support - daily SBT's ; - CXR still with significant pulm edema (improved but present), continue diuresis - will likely need T-piece prior to extubation - would start empiric abx for HCAP if she has recurrent fever or other clinical signs, see ID section - heparin gtt for hx PE, transition back to coumadin once stabilized  CARDIOVASCULAR A: Post op shock, likely propofol related, minimal blood loss > resolved Acute MI Acute cardiogenic pulm edema P:  - KVO IVF. - increase lasix to 80mg  q8h  On 3/13 - follow S Cr, BMP - appreciate cardiology assistance, received ASA, heparin gtt, metoprolol  RENAL A:  Hypokalemia hypophosphatemia P:   - replace electrolytes - follow BMET, UOP  GASTROINTESTINAL A:  Hernia repair. P:   - Plans per surgery  HEMATOLOGIC A:  No active issues.  P:  - follow CBC  INFECTIOUS A:  B infiltrates, likely pulm edema but at risk for HCAP P:   - cx obtained 3/13 - defer abx at this time but will add HCAP coverage if she spikes temp or if any other clinical signs PNA - check Pct and trend  ENDOCRINE A:  No history of diabetes.   P:   - Monitor.  NEUROLOGIC A:  Sedated and intubated. P:   - Continuous propofol + fentanyl  I have personally obtained a history, examined the patient, evaluated  laboratory and imaging results, formulated the assessment and plan and placed orders.  CRITICAL CARE: The patient is critically ill with multiple organ systems failure and requires high complexity decision making for assessment and support, frequent evaluation and titration of therapies, application of advanced monitoring technologies and extensive interpretation of multiple databases. Critical Care Time devoted to patient care services described in this note is 30 minutes.   Levy Pupa, MD, PhD 09/30/2012, 8:59 AM [facility] [phone] or if no answer [phone]

## 2012-09-30 NOTE — Progress Notes (Signed)
3 Days Post-Op  Subjective: HD stable and alert. Remains vent dependent due to pulmonary edema. Good UOP, negative fluid balance. No stools. NG output low, 500 cc/24 hour. No nausea.  Anticoagulated. No signs of bleeding  Temp to 102. Pulmonary source or non-infectious etiology a good bet but will request urine and blood cultures. WBC 12,400. Hgb 12.0.  K 3.4.Breanna KitchenMarland Kitchenreceiving suppliments per CCM.  Apparently to try wean again today. Will need cardiac cath after extubated.  Objective: Vital signs in last 24 hours: Temp:  [100.2 F (37.9 C)-102.1 F (38.9 C)] 100.3 F (37.9 C) (03/12 2340) Pulse Rate:  [90-107] 103 (03/13 0500) Resp:  [12-30] 23 (03/13 0500) BP: (98-131)/(65-108) 122/82 mmHg (03/13 0500) SpO2:  [88 %-94 %] 93 % (03/13 0500) FiO2 (%):  [50 %] 50 % (03/13 0358) Last BM Date:  (family states prior to admission)  Intake/Output from previous day: 03/12 0701 - 03/13 0700 In: 1733.5 [I.V.:1233.5; IV Piggyback:500] Out: 2705 [Urine:2555; Emesis/NG output:150] Intake/Output this shift: Total I/O In: 638.1 [I.V.:638.1] Out: 1020 [Urine:1020]  General appearance: Arousable and cooperative. appropriate and answers questions. Resp: coarse BS bilat. GI: obese. soft. wounds OK. not distended. occas. BS.  Lab Results:  Results for orders placed during the hospital encounter of 09/27/12 (from the past 24 hour(s))  HEPARIN LEVEL (UNFRACTIONATED)     Status: Abnormal   Collection Time    09/29/12  7:20 AM      Result Value Range   Heparin Unfractionated 0.24 (*) 0.30 - 0.70 IU/mL  CBC     Status: Abnormal   Collection Time    09/29/12  7:20 AM      Result Value Range   WBC 10.8 (*) 4.0 - 10.5 K/uL   RBC 4.76  3.87 - 5.11 MIL/uL   Hemoglobin 12.9  12.0 - 15.0 g/dL   HCT 86.5  78.4 - 69.6 %   MCV 82.6  78.0 - 100.0 fL   MCH 27.1  26.0 - 34.0 pg   MCHC 32.8  30.0 - 36.0 g/dL   RDW 29.5 (*) 28.4 - 13.2 %   Platelets 250  150 - 400 K/uL  BASIC METABOLIC PANEL     Status:  Abnormal   Collection Time    09/29/12  7:20 AM      Result Value Range   Sodium 139  135 - 145 mEq/L   Potassium 3.0 (*) 3.5 - 5.1 mEq/L   Chloride 98  96 - 112 mEq/L   CO2 29  19 - 32 mEq/L   Glucose, Bld 111 (*) 70 - 99 mg/dL   BUN 10  6 - 23 mg/dL   Creatinine, Ser 4.40  0.50 - 1.10 mg/dL   Calcium 8.8  8.4 - 10.2 mg/dL   GFR calc non Af Amer 75 (*) >90 mL/min   GFR calc Af Amer 87 (*) >90 mL/min  MAGNESIUM     Status: None   Collection Time    09/29/12  7:20 AM      Result Value Range   Magnesium 2.0  1.5 - 2.5 mg/dL  PHOSPHORUS     Status: Abnormal   Collection Time    09/29/12  7:20 AM      Result Value Range   Phosphorus 1.8 (*) 2.3 - 4.6 mg/dL  TRIGLYCERIDES     Status: Abnormal   Collection Time    09/29/12  7:20 AM      Result Value Range   Triglycerides 247 (*) <150 mg/dL  HEPARIN LEVEL (UNFRACTIONATED)     Status: Abnormal   Collection Time    09/29/12  3:26 PM      Result Value Range   Heparin Unfractionated 0.14 (*) 0.30 - 0.70 IU/mL  HEPARIN LEVEL (UNFRACTIONATED)     Status: Abnormal   Collection Time    09/29/12  9:59 PM      Result Value Range   Heparin Unfractionated 0.13 (*) 0.30 - 0.70 IU/mL  CBC     Status: Abnormal   Collection Time    09/30/12  3:45 AM      Result Value Range   WBC 12.4 (*) 4.0 - 10.5 K/uL   RBC 4.42  3.87 - 5.11 MIL/uL   Hemoglobin 12.0  12.0 - 15.0 g/dL   HCT 33.2  95.1 - 88.4 %   MCV 84.2  78.0 - 100.0 fL   MCH 27.1  26.0 - 34.0 pg   MCHC 32.3  30.0 - 36.0 g/dL   RDW 16.6 (*) 06.3 - 01.6 %   Platelets 239  150 - 400 K/uL  COMPREHENSIVE METABOLIC PANEL     Status: Abnormal   Collection Time    09/30/12  3:45 AM      Result Value Range   Sodium 138  135 - 145 mEq/L   Potassium 3.4 (*) 3.5 - 5.1 mEq/L   Chloride 98  96 - 112 mEq/L   CO2 27  19 - 32 mEq/L   Glucose, Bld 117 (*) 70 - 99 mg/dL   BUN 12  6 - 23 mg/dL   Creatinine, Ser 0.10  0.50 - 1.10 mg/dL   Calcium 8.8  8.4 - 93.2 mg/dL   Total Protein 7.6  6.0 -  8.3 g/dL   Albumin 2.6 (*) 3.5 - 5.2 g/dL   AST 19  0 - 37 U/L   ALT 6  0 - 35 U/L   Alkaline Phosphatase 71  39 - 117 U/L   Total Bilirubin 0.9  0.3 - 1.2 mg/dL   GFR calc non Af Amer 71 (*) >90 mL/min   GFR calc Af Amer 82 (*) >90 mL/min  PHOSPHORUS     Status: Abnormal   Collection Time    09/30/12  3:45 AM      Result Value Range   Phosphorus 2.1 (*) 2.3 - 4.6 mg/dL  PRO B NATRIURETIC PEPTIDE     Status: Abnormal   Collection Time    09/30/12  3:45 AM      Result Value Range   Pro B Natriuretic peptide (BNP) 2211.0 (*) 0 - 125 pg/mL     Studies/Results: @[id number] @  . antiseptic oral rinse  15 mL Mouth Rinse QID  . aspirin  300 mg Rectal Daily  . chlorhexidine  15 mL Mouth Rinse BID  . furosemide  40 mg Intravenous Q8H  . magnesium hydroxide  30 mL Per Tube Once  . metoprolol  5 mg Intravenous Q6H  . pantoprazole (PROTONIX) IV  40 mg Intravenous Q24H  . simvastatin  20 mg Oral q1800     Assessment/Plan: s/p Procedure(s): LAPAROSCOPIC INCISIONAL HERNIA possible open INSERTION OF MESH   POD #3. Laparoscopic ventral hernia repair with mesh. No obvious surgical complications.  Follow hemoglobin carefully due to heparin drip .  No bleeding curently Continue OG suction until extubated.Breanna Wilkins MON ordered  Acute perioperative lateral STEMI., without hemodynamic compromise. No chest pain.  Cardiology has elected noninvasive approach until extubated, then will need cath.  Pulmonary edema, presumably  cardiogenic.Breanna Wilkins Receiving Lasix and fluid restriction. This will obviously slowdown plans for Weaning.   Fever. Suspect pulmonary origin, Blood and urine cultures ordered. No indication for antibiotics currently.  Nutrition.  NPO. Will need to address this weekend if still intubated.   History pulmonary embolism, on chronic Coumadin As an outpatient.   History of sleep apnea   History hypertension-On Lopressor and lisinopril.   On PPI for ulcer prophylaxis.   Morbid  obesity  Unsuccessful with contact of family/partner yesterday. Asking nursing staff to assist with setting up update conference.  CCM and Cardiology are providing almost all care at this point. Would favor transfer to their service.   @PROBHOSP @  LOS: 3 days    Haywood M. Derrell Lolling, M.D., [facility] General and Minimally invasive Surgery Breast and Colorectal Surgery Office:   [phone] Pager:   [phone]  09/30/2012  . .prob

## 2012-09-30 NOTE — Progress Notes (Signed)
SUBJECTIVE:  The patient is intubated but awake.  She denies any chest pain.  N   PHYSICAL EXAM Filed Vitals:   09/30/12 1600 09/30/12 1626 09/30/12 1700 09/30/12 1800  BP: 116/73 116/73 129/66 121/73  Pulse: 104 107 109 93  Temp:      TempSrc:      Resp: 20 27 22 22   Height:      Weight:      SpO2: 94% 95% 96% 95%   General:  No distress Lungs:  Decreased breath sounds Heart:  RRR Abdomen:  Decreased bowel sounds Extremities:  Mild diffuse edema  LABS: Lab Results  Component Value Date   TROPONINI 2.92* 09/28/2012   Results for orders placed during the hospital encounter of 09/27/12 (from the past 24 hour(s))  HEPARIN LEVEL (UNFRACTIONATED)     Status: Abnormal   Collection Time    09/29/12  9:59 PM      Result Value Range   Heparin Unfractionated 0.13 (*) 0.30 - 0.70 IU/mL  CBC     Status: Abnormal   Collection Time    09/30/12  3:45 AM      Result Value Range   WBC 12.4 (*) 4.0 - 10.5 K/uL   RBC 4.42  3.87 - 5.11 MIL/uL   Hemoglobin 12.0  12.0 - 15.0 g/dL   HCT 59.5  63.8 - 75.6 %   MCV 84.2  78.0 - 100.0 fL   MCH 27.1  26.0 - 34.0 pg   MCHC 32.3  30.0 - 36.0 g/dL   RDW 43.3 (*) 29.5 - 18.8 %   Platelets 239  150 - 400 K/uL  COMPREHENSIVE METABOLIC PANEL     Status: Abnormal   Collection Time    09/30/12  3:45 AM      Result Value Range   Sodium 138  135 - 145 mEq/L   Potassium 3.4 (*) 3.5 - 5.1 mEq/L   Chloride 98  96 - 112 mEq/L   CO2 27  19 - 32 mEq/L   Glucose, Bld 117 (*) 70 - 99 mg/dL   BUN 12  6 - 23 mg/dL   Creatinine, Ser 4.16  0.50 - 1.10 mg/dL   Calcium 8.8  8.4 - 60.6 mg/dL   Total Protein 7.6  6.0 - 8.3 g/dL   Albumin 2.6 (*) 3.5 - 5.2 g/dL   AST 19  0 - 37 U/L   ALT 6  0 - 35 U/L   Alkaline Phosphatase 71  39 - 117 U/L   Total Bilirubin 0.9  0.3 - 1.2 mg/dL   GFR calc non Af Amer 71 (*) >90 mL/min   GFR calc Af Amer 82 (*) >90 mL/min  PHOSPHORUS     Status: Abnormal   Collection Time    09/30/12  3:45 AM      Result Value Range     Phosphorus 2.1 (*) 2.3 - 4.6 mg/dL  PRO B NATRIURETIC PEPTIDE     Status: Abnormal   Collection Time    09/30/12  3:45 AM      Result Value Range   Pro B Natriuretic peptide (BNP) 2211.0 (*) 0 - 125 pg/mL  URINALYSIS, ROUTINE W REFLEX MICROSCOPIC     Status: None   Collection Time    09/30/12  6:42 AM      Result Value Range   Color, Urine YELLOW  YELLOW   APPearance CLEAR  CLEAR   Specific Gravity, Urine 1.010  1.005 - 1.030  pH 6.0  5.0 - 8.0   Glucose, UA NEGATIVE  NEGATIVE mg/dL   Hgb urine dipstick NEGATIVE  NEGATIVE   Bilirubin Urine NEGATIVE  NEGATIVE   Ketones, ur NEGATIVE  NEGATIVE mg/dL   Protein, ur NEGATIVE  NEGATIVE mg/dL   Urobilinogen, UA 1.0  0.0 - 1.0 mg/dL   Nitrite NEGATIVE  NEGATIVE   Leukocytes, UA NEGATIVE  NEGATIVE  HEPARIN LEVEL (UNFRACTIONATED)     Status: Abnormal   Collection Time    09/30/12  8:00 AM      Result Value Range   Heparin Unfractionated 0.14 (*) 0.30 - 0.70 IU/mL  HEPARIN LEVEL (UNFRACTIONATED)     Status: Abnormal   Collection Time    09/30/12  3:27 PM      Result Value Range   Heparin Unfractionated 0.15 (*) 0.30 - 0.70 IU/mL    Intake/Output Summary (Last 24 hours) at 09/30/12 1809 Last data filed at 09/30/12 1803  Gross per 24 hour  Intake 2672.88 ml  Output   3160 ml  Net -487.12 ml    ASSESSMENT AND PLAN:  Lateral wall MI:  No ongoing pain. Plan cath likely prior to discharge but not urgently indicated.  Continue heparin today.    Acute mixed systolic and diastolic HF:  I agree with increased diuresis ordered to day.  Continued edema on CXR.  EF not estimated on echo but reduced.   Currently on IV beta blocker only while NPO.  Start afterload reduction with low dose ACE inhibitor when taking PO.    Ventral hernia repair   Rollene Rotunda 09/30/2012 6:09 PM

## 2012-09-30 NOTE — Progress Notes (Signed)
SBT attempted again. RR increased into the upper 40's, and SpO2 dropped again. Pt placed back on full support and tolerating at this time. RT will continue to monitor.

## 2012-09-30 NOTE — Progress Notes (Addendum)
ANTICOAGULATION CONSULT NOTE - Follow Up Consult  Pharmacy Consult for heparin Indication:STEMI s/p ventral hernia repair   Allergies  Allergen Reactions  . Aspirin     REACTION: Nausea (325mg )    Patient Measurements: Height: 5\' 7"  (170.2 cm) Weight: 307 lb 1.6 oz (139.3 kg) IBW/kg (Calculated) : 61.6 Heparin Dosing Weight: 95.7 kg  Vital Signs: Temp: 102 F (38.9 C) (03/13 1523) Temp src: Oral (03/13 1523) BP: 116/73 mmHg (03/13 1600) Pulse Rate: 104 (03/13 1600)  Labs:  Recent Labs  09/27/12 1727 09/27/12 2228  09/28/12 0445  09/28/12 1545 09/28/12 1740  09/29/12 0720  09/29/12 2159 09/30/12 0345 09/30/12 0800 09/30/12 1527  HGB  --   --   < >  --   --  15.2*  --   --  12.9  --   --  12.0  --   --   HCT  --   --   < >  --   --  45.3  --   --  39.3  --   --  37.2  --   --   PLT  --   --   < >  --   --  244  --   --  250  --   --  239  --   --   HEPARINUNFRC  --   --   --  0.36  < >  --   --   < > 0.24*  < > 0.13*  --  0.14* 0.15*  CREATININE  --   --   --  0.80  --   --  0.84  --  0.87  --   --  0.91  --   --   TROPONINI 3.73* 4.56*  --  2.92*  --   --   --   --   --   --   --   --   --   --   < > = values in this interval not displayed.  Estimated Creatinine Clearance: 104.6 ml/min (by C-G formula based on Cr of 0.91).   Medications:  Scheduled:  . antiseptic oral rinse  15 mL Mouth Rinse QID  . aspirin  300 mg Rectal Daily  . chlorhexidine  15 mL Mouth Rinse BID  . feeding supplement (OSMOLITE 1.2 CAL)  1,000 mL Per Tube Q24H  . feeding supplement  30 mL Per Tube Custom  . furosemide  80 mg Intravenous Q8H  . [COMPLETED] heparin  2,500 Units Intravenous Once  . [COMPLETED] magnesium hydroxide  30 mL Per Tube Once  . metoprolol  5 mg Intravenous Q6H  . pantoprazole (PROTONIX) IV  40 mg Intravenous Q24H  . [COMPLETED] potassium chloride  10 mEq Intravenous Q1 Hr x 5  . [COMPLETED] potassium chloride  40 mEq Oral Q4H  . [COMPLETED] potassium phosphate  IVPB (mmol)  30 mmol Intravenous Once  . potassium phosphate IVPB (mmol)  30 mmol Intravenous Once  . simvastatin  20 mg Oral q1800  . [COMPLETED] white petrolatum      . [DISCONTINUED] furosemide  40 mg Intravenous Q8H  . [DISCONTINUED] furosemide  40 mg Oral Daily    Assessment: 52 yo female on heparin gtt for new STEMI s/p hernia repair on 3/10. Patient was on Coumadin PTA for history of PE/DVT (last dose on 3/4). Xa level remains subtherapeutic despite rate increase, will increase further. No infusion line issues per RN. Patient still having stable  bloody secretions from ET  tube. No other bleeding noted and H/H and platelets are wnl/stable.  Goal of Therapy:  Heparin level 0.3-0.7 units/ml Monitor platelets by anticoagulation protocol: Yes   Plan:  Heparin 2000 unit IV bolus and increase gtt rate to 2700 units/hr. Check 6 hour level.  Verlene Mayer, PharmD, BCPS Pager [phone] 09/30/2012 4:21 PM    Antibiotic addendum  Pharmacy consult to add Vancomcyin/Cefepime for HCAP given fevers and CXR.  Due to obesity, will start vancomycin 2.5g IV load then 1500mg  q12h with cefepime 2g IV q8h. Will monitor renal function and f/u c&s.  Verlene Mayer, PharmD, BCPS Pager [phone]

## 2012-09-30 NOTE — Progress Notes (Signed)
ANTICOAGULATION CONSULT NOTE  Pharmacy Consult for heparin Indication: h/o DVT/PE, s/p perioperative STEMI   Allergies  Allergen Reactions  . Aspirin     REACTION: Nausea (325mg )    Patient Measurements: Height: 5\' 7"  (170.2 cm) Weight: 307 lb 1.6 oz (139.3 kg) IBW/kg (Calculated) : 61.6 Heparin Dosing Weight: 95.7 kg  Vital Signs: Temp: 100.3 F (37.9 C) (03/12 2340) Temp src: Oral (03/12 2340) BP: 130/90 mmHg (03/12 2300) Pulse Rate: 104 (03/12 2300)  Labs:  Recent Labs  09/27/12 0922  09/27/12 1727 09/27/12 2228 09/28/12 0338 09/28/12 0445  09/28/12 1545 09/28/12 1740  09/29/12 0720 09/29/12 1526 09/29/12 2159  HGB  --   < >  --   --  11.4*  --   --  15.2*  --   --  12.9  --   --   HCT  --   < >  --   --  34.7*  --   --  45.3  --   --  39.3  --   --   PLT  --   < >  --   --  207  --   --  244  --   --  250  --   --   LABPROT 13.8  --   --   --   --   --   --   --   --   --   --   --   --   INR 1.07  --   --   --   --   --   --   --   --   --   --   --   --   HEPARINUNFRC  --   --   --   --   --  0.36  < >  --   --   < > 0.24* 0.14* 0.13*  CREATININE  --   < >  --   --   --  0.80  --   --  0.84  --  0.87  --   --   TROPONINI  --   --  3.73* 4.56*  --  2.92*  --   --   --   --   --   --   --   < > = values in this interval not displayed.  Estimated Creatinine Clearance: 109.4 ml/min (by C-G formula based on Cr of 0.87).  Assessment: 52 yo female with history of PE/DVT for anticoagulation  Goal of Therapy:  Heparin level 0.3-0.7 units/ml Monitor platelets by anticoagulation protocol: Yes   Plan:  Heparin 2500 units IV bolus, then increase heparin 2400 units/hr Follow-up am labs.  Geannie Risen, PharmD, BCPS  09/30/2012 12:27 AM

## 2012-09-30 NOTE — Progress Notes (Signed)
ANTICOAGULATION CONSULT NOTE - Follow Up Consult  Pharmacy Consult for heparin Indication: STEMI  Labs:  Recent Labs  09/28/12 0445  09/28/12 1545 09/28/12 1740  09/29/12 0720  09/30/12 0345 09/30/12 0800 09/30/12 1527 09/30/12 2230  HGB  --   --  15.2*  --   --  12.9  --  12.0  --   --   --   HCT  --   --  45.3  --   --  39.3  --  37.2  --   --   --   PLT  --   --  244  --   --  250  --  239  --   --   --   HEPARINUNFRC 0.36  < >  --   --   < > 0.24*  < >  --  0.14* 0.15* 0.32  CREATININE 0.80  --   --  0.84  --  0.87  --  0.91  --   --   --   TROPONINI 2.92*  --   --   --   --   --   --   --   --   --   --   < > = values in this interval not displayed.  Assessment/Plan:  52yo female now therapeutic on heparin after rate increases and difficulty obtaining levels at goal.  Will continue gtt at current rate and confirm stable with am labs.  Vernard Gambles, PharmD, BCPS  09/30/2012,11:29 PM

## 2012-09-30 NOTE — Progress Notes (Signed)
SpO2 dropping to 89-90%. FiO2 titrated back to 50%. RT will continue to monitor.

## 2012-09-30 NOTE — Progress Notes (Signed)
ANTICOAGULATION CONSULT NOTE - Follow Up Consult  Pharmacy Consult for heparin Indication:STEMI s/p ventral hernia repair   Allergies  Allergen Reactions  . Aspirin     REACTION: Nausea (325mg )    Patient Measurements: Height: 5\' 7"  (170.2 cm) Weight: 307 lb 1.6 oz (139.3 kg) IBW/kg (Calculated) : 61.6  Vital Signs: Temp: 99.8 F (37.7 C) (03/13 1129) Temp src: Oral (03/13 1129) BP: 125/80 mmHg (03/13 1135) Pulse Rate: 100 (03/13 1135)  Labs:  Recent Labs  09/27/12 1727 09/27/12 2228  09/28/12 0445  09/28/12 1545 09/28/12 1740  09/29/12 0720 09/29/12 1526 09/29/12 2159 09/30/12 0345 09/30/12 0800  HGB  --   --   < >  --   --  15.2*  --   --  12.9  --   --  12.0  --   HCT  --   --   < >  --   --  45.3  --   --  39.3  --   --  37.2  --   PLT  --   --   < >  --   --  244  --   --  250  --   --  239  --   HEPARINUNFRC  --   --   --  0.36  < >  --   --   < > 0.24* 0.14* 0.13*  --  0.14*  CREATININE  --   --   --  0.80  --   --  0.84  --  0.87  --   --  0.91  --   TROPONINI 3.73* 4.56*  --  2.92*  --   --   --   --   --   --   --   --   --   < > = values in this interval not displayed.  Estimated Creatinine Clearance: 104.6 ml/min (by C-G formula based on Cr of 0.91).   Medications:  Scheduled:  . antiseptic oral rinse  15 mL Mouth Rinse QID  . aspirin  300 mg Rectal Daily  . chlorhexidine  15 mL Mouth Rinse BID  . feeding supplement (OSMOLITE 1.2 CAL)  1,000 mL Per Tube Q24H  . feeding supplement  30 mL Per Tube Custom  . furosemide  80 mg Intravenous Q8H  . [COMPLETED] heparin  2,500 Units Intravenous Once  . [COMPLETED] magnesium hydroxide  30 mL Per Tube Once  . metoprolol  5 mg Intravenous Q6H  . pantoprazole (PROTONIX) IV  40 mg Intravenous Q24H  . [COMPLETED] potassium chloride  10 mEq Intravenous Q1 Hr x 5  . potassium chloride  40 mEq Oral Q4H  . [COMPLETED] potassium phosphate IVPB (mmol)  30 mmol Intravenous Once  . potassium phosphate IVPB (mmol)   30 mmol Intravenous Once  . simvastatin  20 mg Oral q1800  . [COMPLETED] white petrolatum      . [DISCONTINUED] furosemide  40 mg Intravenous Q8H  . [DISCONTINUED] furosemide  40 mg Oral Daily    Assessment: 52 yo female on heparin gtt for new STEMI s/p hernia repair on 3/10. Patient was on Coumadin PTA for history of PE/DVT (last dose on 3/4). Heparin level subtherapeutic this am. RN informed pharmacy the 2500 units bolus (ordered previously) was administered, however the rate was not increased to 2400 units/hr until 9:30 am on 3/13. Not surprised level is low.   No infusion line issues per RN. Patient still having stable  bloody secretions  from ET tube. No other bleeding noted and H/H and platelets are wnl/stable.  Goal of Therapy:  Heparin level 0.3-0.7 units/ml Monitor platelets by anticoagulation protocol: Yes   Plan:  - F/u 6hr heparin level (with rate at 2400 units/hr) - Continue to monitor H/H and platelets - Continue to monitor for bleeding  Lillia Pauls, PharmD Clinical Pharmacist Pager: [phone] Phone: [phone] 09/30/2012 2:53 PM

## 2012-09-30 NOTE — Progress Notes (Signed)
INITIAL NUTRITION ASSESSMENT  DOCUMENTATION CODES Per approved criteria  -Morbid Obesity   INTERVENTION: Initiate Osmolite 1.2 at goal rate of 15 ml/hr. Add Prostat 30 ml to tube 7x daily.  This regimen and current propofol provides 1728 kcal (70%), 125 grams of protein (100% of minimum estimated needs) and 295 ml free water.   NUTRITION DIAGNOSIS: Inadequate oral intake related to inability to eat as evidenced by NPO status.   Goal: Enteral nutrition to provide 60-70% of estimated calorie needs (22-25 kcals/kg ideal body weight) and 100% of estimated protein needs, based on ASPEN guidelines for permissive underfeeding in critically ill obese individuals.  Monitor:  Vent settings, weight trends, propofol drip, toleration of diet advancement    Reason for Assessment: VDRF  52 y.o. female  Admitting Dx: Acute respiratory failure  ASSESSMENT: Pt is 52 yo female with history of PE on coumadin, CHF and pulmonary nodule as mentioned below who presents to [facility] after a ventral hernia repair by CCS. Patient was in pulmonary edema and hypotensive post op and decision was made to keep her intubated and moved to the ICU. Pt still currently intubated, weaning taking longer than expected. Pt currently with OG tube to suction.   Pt is currently intubated, sedated and on ventilator support MV: 13.4  Temp: 37.8  Propofol: 25.3 ml/hr providing 668 kcal   Pt discussed in rounds with RN staff and CCM physician; RD to enter TF orders.  Height: Ht Readings from Last 1 Encounters:  09/28/12 5\' 7"  (1.702 m)    Weight: Wt Readings from Last 1 Encounters:  09/28/12 307 lb 1.6 oz (139.3 kg)    Ideal Body Weight: 135 lbs   % Ideal Body Weight: 227%   Wt Readings from Last 10 Encounters:  09/28/12 307 lb 1.6 oz (139.3 kg)  09/28/12 307 lb 1.6 oz (139.3 kg)  09/20/12 309 lb 4.8 oz (140.298 kg)  09/06/12 303 lb 3.2 oz (137.531 kg)  08/31/12 303 lb (137.44 kg)  04/12/12 308 lb 11.2 oz (140.025  kg)  04/12/12 308 lb 6.4 oz (139.889 kg)  02/17/12 309 lb 8 oz (140.388 kg)  01/20/12 306 lb (138.801 kg)  12/03/11 306 lb 6.4 oz (138.982 kg)    Usual Body Weight: 305 lbs   % Usual Body Weight: 100%   BMI:  Body mass index is 48.09 kg/(m^2). Morbid obesity   Estimated Nutritional Needs: Kcal: 2468 Protein: 125-140 gm Fluid: 1.5 L   Skin: incision abdomen   Diet Order:  NPO   EDUCATION NEEDS: -No education needs identified at this time   Intake/Output Summary (Last 24 hours) at 09/30/12 0841 Last data filed at 09/30/12 0800  Gross per 24 hour  Intake 1682.81 ml  Output   2845 ml  Net -1162.19 ml    Last BM: none documented    Labs:   Recent Labs Lab 09/27/12 1704 09/28/12 0445 09/28/12 1740 09/29/12 0720 09/30/12 0345  NA 138 137 138 139 138  K 3.8 3.8 5.1 3.0* 3.4*  CL 101 101 101 98 98  CO2 27 27 21 29 27   BUN 13 12 12 10 12   CREATININE 0.74 0.80 0.84 0.87 0.91  CALCIUM 9.0 8.5 8.8 8.8 8.8  MG 2.0 1.6  --  2.0  --   PHOS 4.4 2.8  --  1.8* 2.1*  GLUCOSE 166* 136* 100* 111* 117*    CBG (last 3)   Recent Labs  09/27/12 1850  GLUCAP 112*    Scheduled Meds: .  antiseptic oral rinse  15 mL Mouth Rinse QID  . aspirin  300 mg Rectal Daily  . chlorhexidine  15 mL Mouth Rinse BID  . furosemide  40 mg Intravenous Q8H  . metoprolol  5 mg Intravenous Q6H  . pantoprazole (PROTONIX) IV  40 mg Intravenous Q24H  . simvastatin  20 mg Oral q1800    Continuous Infusions: . sodium chloride 10 mL/hr at 09/29/12 1100  . heparin 2,400 Units/hr (09/30/12 0534)  . propofol 30 mcg/kg/min (09/30/12 1610)    Past Medical History  Diagnosis Date  . Shoulder pain   . Pulmonary embolus 01/16/10    on Coumadin indefinitely  . Abdominal hernia   . Hypertension   . Overweight   . Lung nodule 12/2009    Very small, left upper lobe by CT June, 2011, one-year followup was stable  . Shortness of breath   . Warfarin anticoagulation   . Ejection fraction      65-70%, vigorous function, echo, March, 2011  . CHF (congestive heart failure)   . OSA (obstructive sleep apnea)     CPAP    Past Surgical History  Procedure Laterality Date  . Abdominal hysterectomy  07/2002    laparoscopic assisted vaginal hysterectomy for menorrhagia, dysmenorrhea, anemia, fibroids  . Knee arthroscopy Left   . Incisional hernia repair N/A 09/27/2012    Procedure: LAPAROSCOPIC INCISIONAL HERNIA possible open;  Surgeon: Ernestene Mention, MD;  Location: [facility] OR;  Service: General;  Laterality: N/A;  . Insertion of mesh N/A 09/27/2012    Procedure: INSERTION OF MESH;  Surgeon: Ernestene Mention, MD;  Location: [facility] OR;  Service: General;  Laterality: N/A;    Belenda Cruise  Dietetic Intern Pager: [phone]   I agree with student dietitian note; appropriate revisions have been made.  Joaquin Courts, RD, LDN, CNSC Pager# [phone] After Hours Pager# [phone]

## 2012-10-01 ENCOUNTER — Inpatient Hospital Stay (HOSPITAL_COMMUNITY): Payer: Medicare Other

## 2012-10-01 DIAGNOSIS — R0602 Shortness of breath: Secondary | ICD-10-CM

## 2012-10-01 LAB — BASIC METABOLIC PANEL
BUN: 34 mg/dL — ABNORMAL HIGH (ref 6–23)
Calcium: 9.2 mg/dL (ref 8.4–10.5)
Creatinine, Ser: 0.95 mg/dL (ref 0.50–1.10)
Creatinine, Ser: 0.99 mg/dL (ref 0.50–1.10)
GFR calc Af Amer: 74 mL/min — ABNORMAL LOW (ref >90)
GFR calc non Af Amer: 67 mL/min — ABNORMAL LOW (ref >90)
GFR calc non Af Amer: 64 mL/min — ABNORMAL LOW (ref >90)
Sodium: 138 mEq/L (ref 135–145)

## 2012-10-01 LAB — URINE CULTURE
Colony Count: NO GROWTH
Culture: NO GROWTH

## 2012-10-01 LAB — PRO B NATRIURETIC PEPTIDE: Pro B Natriuretic peptide (BNP): 964.6 pg/mL — ABNORMAL HIGH (ref 0–125)

## 2012-10-01 LAB — HEPARIN LEVEL (UNFRACTIONATED)
Heparin Unfractionated: 0.19 IU/mL — ABNORMAL LOW (ref 0.30–0.70)
Heparin Unfractionated: 0.39 IU/mL (ref 0.30–0.70)

## 2012-10-01 LAB — CBC
MCH: 27.5 pg (ref 26.0–34.0)
MCHC: 32.7 g/dL (ref 30.0–36.0)
RDW: 16 % — ABNORMAL HIGH (ref 11.5–15.5)

## 2012-10-01 LAB — PROCALCITONIN: Procalcitonin: 0.49 ng/mL

## 2012-10-01 LAB — PHOSPHORUS: Phosphorus: 2.9 mg/dL (ref 2.3–4.6)

## 2012-10-01 LAB — GLUCOSE, CAPILLARY

## 2012-10-01 LAB — MAGNESIUM: Magnesium: 2.3 mg/dL (ref 1.5–2.5)

## 2012-10-01 MED ORDER — POTASSIUM CHLORIDE 20 MEQ/15ML (10%) PO LIQD
20.0000 meq | ORAL | Status: AC
  Administered 2012-10-01 (×2): 20 meq
  Filled 2012-10-01 (×2): qty 15

## 2012-10-01 MED ORDER — POTASSIUM CHLORIDE 20 MEQ/15ML (10%) PO LIQD
ORAL | Status: AC
  Administered 2012-10-01: 20 meq via ORAL
  Filled 2012-10-01: qty 15

## 2012-10-01 MED ORDER — DEXTROSE 50 % IV SOLN
INTRAVENOUS | Status: AC
  Filled 2012-10-01: qty 50

## 2012-10-01 NOTE — Progress Notes (Signed)
ANTICOAGULATION CONSULT NOTE - Follow Up Consult  Pharmacy Consult for heparin Indication: STEMI and h/o PE/DVT  Labs:  Recent Labs  09/29/12 0720  09/30/12 0345  10/01/12 0405 10/01/12 1255 10/01/12 2209  HGB 12.9  --  12.0  --  11.8*  --   --   HCT 39.3  --  37.2  --  36.1  --   --   PLT 250  --  239  --  265  --   --   HEPARINUNFRC 0.24*  < >  --   < > 0.19* 0.32 0.39  CREATININE 0.87  --  0.91  --  0.95  --   --   < > = values in this interval not displayed.   Assessment/Plan:  52yo female remains therapeutic on heparin requiring high rate.  Will continue gtt at current rate and continue to monitor daily levels.  Vernard Gambles, PharmD, BCPS  10/01/2012,11:01 PM

## 2012-10-01 NOTE — Progress Notes (Signed)
4 Days Post-Op  Subjective: Remains alert, HD stable, but vent. Dependent.  Cooperative. Tolerating trickle tube feeds. No nausea or abdominal pain.  No more fever spikes. Urine and blood cultures pending. Started empirically  on Vanc and Maxipime per CCM yesterday for possible HCAP.   Hgb. 11.8, stable. WBC 10,900. Other labs pending.  Spoke with sister and partner yesterday for clinical update.  Objective: Vital signs in last 24 hours: Temp:  [98.8 F (37.1 C)-102 F (38.9 C)] 98.8 F (37.1 C) (03/14 0416) Pulse Rate:  [88-109] 95 (03/14 0500) Resp:  [5-31] 15 (03/14 0500) BP: (109-143)/(62-95) 120/79 mmHg (03/14 0500) SpO2:  [92 %-96 %] 95 % (03/14 0500) FiO2 (%):  [40 %-50 %] 40 % (03/14 0500) Last BM Date:  (family states prior to admission)  Intake/Output from previous day: 03/13 0701 - 03/14 0700 In: 3397.3 [I.V.:1297.3; NG/GT:490; IV Piggyback:1610] Out: 2450 [Urine:1440; Emesis/NG output:1010] Intake/Output this shift: Total I/O In: 1375.5 [I.V.:585.5; NG/GT:240; IV Piggyback:550] Out: 750 [Emesis/NG output:750]     EXAM: General appearance: alert. sedated but coop. and f/c.   no distress. Resp: BS more clear, less coarse, min. secr. GI: abd. soft, active BS, wounds clean.  Lab Results:  Results for orders placed during the hospital encounter of 09/27/12 (from the past 24 hour(s))  URINALYSIS, ROUTINE W REFLEX MICROSCOPIC     Status: None   Collection Time    09/30/12  6:42 AM      Result Value Range   Color, Urine YELLOW  YELLOW   APPearance CLEAR  CLEAR   Specific Gravity, Urine 1.010  1.005 - 1.030   pH 6.0  5.0 - 8.0   Glucose, UA NEGATIVE  NEGATIVE mg/dL   Hgb urine dipstick NEGATIVE  NEGATIVE   Bilirubin Urine NEGATIVE  NEGATIVE   Ketones, ur NEGATIVE  NEGATIVE mg/dL   Protein, ur NEGATIVE  NEGATIVE mg/dL   Urobilinogen, UA 1.0  0.0 - 1.0 mg/dL   Nitrite NEGATIVE  NEGATIVE   Leukocytes, UA NEGATIVE  NEGATIVE  HEPARIN LEVEL (UNFRACTIONATED)      Status: Abnormal   Collection Time    09/30/12  8:00 AM      Result Value Range   Heparin Unfractionated 0.14 (*) 0.30 - 0.70 IU/mL  HEPARIN LEVEL (UNFRACTIONATED)     Status: Abnormal   Collection Time    09/30/12  3:27 PM      Result Value Range   Heparin Unfractionated 0.15 (*) 0.30 - 0.70 IU/mL  PROCALCITONIN     Status: None   Collection Time    09/30/12  5:18 PM      Result Value Range   Procalcitonin 0.55    HEPARIN LEVEL (UNFRACTIONATED)     Status: None   Collection Time    09/30/12 10:30 PM      Result Value Range   Heparin Unfractionated 0.32  0.30 - 0.70 IU/mL  CBC     Status: Abnormal   Collection Time    10/01/12  4:05 AM      Result Value Range   WBC 10.9 (*) 4.0 - 10.5 K/uL   RBC 4.29  3.87 - 5.11 MIL/uL   Hemoglobin 11.8 (*) 12.0 - 15.0 g/dL   HCT 16.1  09.6 - 04.5 %   MCV 84.1  78.0 - 100.0 fL   MCH 27.5  26.0 - 34.0 pg   MCHC 32.7  30.0 - 36.0 g/dL   RDW 40.9 (*) 81.1 - 91.4 %   Platelets 265  150 - 400 K/uL     Studies/Results: @RISRSLT24 @  . antiseptic oral rinse  15 mL Mouth Rinse QID  . aspirin  300 mg Rectal Daily  . ceFEPime (MAXIPIME) IV  2 g Intravenous Q8H  . chlorhexidine  15 mL Mouth Rinse BID  . feeding supplement (OSMOLITE 1.2 CAL)  1,000 mL Per Tube Q24H  . feeding supplement  30 mL Per Tube Custom  . furosemide  80 mg Intravenous Q8H  . metoprolol  5 mg Intravenous Q6H  . pantoprazole (PROTONIX) IV  40 mg Intravenous Q24H  . simvastatin  20 mg Oral q1800  . vancomycin  1,500 mg Intravenous Q12H     Assessment/Plan: s/p Procedure(s): LAPAROSCOPIC INCISIONAL HERNIA possible open INSERTION OF MESH  POD #4. Laparoscopic ventral hernia repair with mesh. No obvious surgical complications.  Follow hemoglobin carefully due to heparin drip . No bleeding curently  Agree with TF's and can advance. Switch to oral diet when extubated..   Acute perioperative lateral STEMI., without hemodynamic compromise. No chest pain.  Cardiology  has elected noninvasive approach until extubated, then will need cath   Pulmonary edema, presumably cardiogenic.Marland Kitchen Receiving Lasix and fluid restriction. This has obviously slowed down plans for weaning. Sounds better on exam today.  Fever. Suspect pulmonary origin, Blood and urine cultures ordered. On Vanc./Maxipine empirically per CCM for possible HCAP.  Nutrition.  TF's started. OK to advance.   History pulmonary embolism, on chronic Coumadin As an outpatient.   History of sleep apnea   History hypertension-On Lopressor and lisinopril.   On PPI for ulcer prophylaxis.   Morbid obesity   CCM and Cardiology are providing essentially all care and decisions  at this point. Would favor transfer to their service.   Breanna Wilkins. Breanna Wilkins, M.D., [facility] General and Minimally invasive Surgery Breast and Colorectal Surgery Office:   [phone] Pager:   [phone]  @PROBHOSP @  LOS: 4 days    Breanna Wilkins 10/01/2012  . .prob

## 2012-10-01 NOTE — Progress Notes (Signed)
Placed patient on auto CPAP with 3L O2 bled in. Tolerating well at this time. RT will continue to monitor.

## 2012-10-01 NOTE — Progress Notes (Signed)
ANTICOAGULATION CONSULT NOTE - Follow Up Consult  Pharmacy Consult for heparin Indication: STEMI  Labs:  Recent Labs  09/28/12 1740  09/29/12 0720  09/30/12 0345  09/30/12 1527 09/30/12 2230 10/01/12 0405  HGB  --   --  12.9  --  12.0  --   --   --  11.8*  HCT  --   --  39.3  --  37.2  --   --   --  36.1  PLT  --   --  250  --  239  --   --   --  265  HEPARINUNFRC  --   < > 0.24*  < >  --   < > 0.15* 0.32 0.19*  CREATININE 0.84  --  0.87  --  0.91  --   --   --   --   < > = values in this interval not displayed.   Assessment: 51yo female now subtherapeutic on heparin after one level at goal and difficulty reaching goal levels while awaiting cath when extubated.  Goal of Therapy:  Heparin level 0.3-0.7 units/ml   Plan:  Will increase heparin gtt by 2 units/kg/hr to 3000 units/hr and check level in 6hr.  Vernard Gambles, PharmD, BCPS  10/01/2012,5:50 AM

## 2012-10-01 NOTE — Progress Notes (Signed)
OSA   CPAP at nighttime ordered

## 2012-10-01 NOTE — Progress Notes (Signed)
PULMONARY  / CRITICAL CARE MEDICINE  Name: Breanna Wilkins MRN: [id number] DOB: [birth year]    ADMISSION DATE:  09/27/2012 CONSULTATION DATE:  09/27/12  REFERRING MD :  [facility]-Dr. Derrell Lolling.  CHIEF COMPLAINT:  VDRF.  BRIEF PATIENT DESCRIPTION: 52 year old with history of PE on coumadin, CHF and pulmonary nodule as mentioned below who presents to [facility] after a ventral hernia repair by [facility]CS.  Patient was in pulmonary edema and hypotensive post op and decision was made to keep her intubated and move to the ICU.  PCCM was consulted for vent management and hemodynamic stabilization.  SIGNIFICANT EVENTS / STUDIES:  3/10 hernia repair and VDRF.  LINES / TUBES: ETT 3/10 >>  PIV   CULTURES: MRSA 3/10 >> negative Urine 3/10 >> negative Urine 3/13 >>  Blood 3/13 >>   ANTIBIOTICS: Surgical prophylaxis post-op  SUBJECTIVE:  Tolerating PSV better today, currently on PSV 12  VITAL SIGNS: Temp:  [98.6 F (37 C)-102 F (38.9 C)] 98.6 F (37 C) (03/14 0839) Pulse Rate:  [88-109] 92 (03/14 1000) Resp:  [5-44] 20 (03/14 1000) BP: (109-143)/(62-95) 134/76 mmHg (03/14 1000) SpO2:  [92 %-96 %] 95 % (03/14 1000) FiO2 (%):  [40 %] 40 % (03/14 0900) HEMODYNAMICS:   VENTILATOR SETTINGS: Vent Mode:  [-] PSV;CPAP FiO2 (%):  [40 %] 40 % Set Rate:  [14 bmp-18 bmp] 14 bmp Vt Set:  [550 mL] 550 mL PEEP:  [5 cmH20] 5 cmH20 Pressure Support:  [5 cmH20] 5 cmH20 Plateau Pressure:  [17 cmH20-29 cmH20] 17 cmH20 INTAKE / OUTPUT: Intake/Output     03/13 0701 - 03/14 0700 03/14 0701 - 03/15 0700   I.V. (mL/kg) 1491.7 (10.7) 132.6 (1)   NG/GT 670 30   IV Piggyback 1610 50   Total Intake(mL/kg) 3771.7 (27.1) 212.6 (1.5)   Urine (mL/kg/hr) 1440 (0.4) 400 (0.8)   Emesis/NG output 1010 (0.3) 50 (0.1)   Total Output 2450 450   Net +1321.7 -237.4        Urine Occurrence 1 x     PHYSICAL EXAMINATION: General:  Chronically ill appearing and obese female, NAD. Neuro:  Awake and following commands HEENT:   [place]/AT, PERRL, EOM-I and MMM. Cardiovascular:  RRR, Nl S1/S2, -M/R/G. Lungs:  Coarse BS diffusely with diffuse crackles. Abdomen:  Obese, soft, diffusely tender, wound ok and -BS. Musculoskeletal:  -edema and -tenderness. Skin:  Intact.  LABS:  Recent Labs Lab 09/27/12 0922 09/27/12 1602  09/27/12 1727 09/27/12 1950 09/27/12 2228  09/28/12 0430 09/28/12 0445  09/29/12 0720 09/30/12 0345 09/30/12 1718 10/01/12 0405  HGB  --   --   < >  --   --   --   < >  --   --   < > 12.9 12.0  --  11.8*  WBC  --   --   < >  --   --   --   < >  --   --   < > 10.8* 12.4*  --  10.9*  PLT  --   --   < >  --   --   --   < >  --   --   < > 250 239  --  265  NA  --   --   < >  --   --   --   --   --  137  < > 139 138  --  138  K  --   --   < >  --   --   --   --   --  3.8  < > 3.0* 3.4*  --  3.5  CL  --   --   < >  --   --   --   --   --  101  < > 98 98  --  97  CO2  --   --   < >  --   --   --   --   --  27  < > 29 27  --  28  GLUCOSE  --   --   < >  --   --   --   --   --  136*  < > 111* 117*  --  123*  BUN  --   --   < >  --   --   --   --   --  12  < > 10 12  --  25*  CREATININE  --   --   < >  --   --   --   --   --  0.80  < > 0.87 0.91  --  0.95  CALCIUM  --   --   < >  --   --   --   --   --  8.5  < > 8.8 8.8  --  9.2  MG  --   --   < >  --   --   --   --   --  1.6  --  2.0  --   --  2.3  PHOS  --   --   < >  --   --   --   --   --  2.8  --  1.8* 2.1*  --  2.9  AST  --   --   --   --   --   --   --   --   --   --   --  19  --   --   ALT  --   --   --   --   --   --   --   --   --   --   --  6  --   --   ALKPHOS  --   --   --   --   --   --   --   --   --   --   --  71  --   --   BILITOT  --   --   --   --   --   --   --   --   --   --   --  0.9  --   --   PROT  --   --   --   --   --   --   --   --   --   --   --  7.6  --   --   ALBUMIN  --   --   --   --   --   --   --   --   --   --   --  2.6*  --   --   INR 1.07  --   --   --   --   --   --   --   --   --   --   --   --   --   TROPONINI   --   --   --  3.73*  --  4.56*  --   --  2.92*  --   --   --   --   --   PROCALCITON  --   --   --   --   --   --   --   --   --   --   --   --  0.55 0.49  PROBNP  --   --   --   --   --   --   --   --   --   --   --  2211.0*  --  964.6*  PHART  --  7.201*  --   --  7.349*  --   --  7.410  --   --   --   --   --   --   PCO2ART  --  66.9*  --   --  47.3*  --   --  44.6  --   --   --   --   --   --   PO2ART  --  87.1  --   --  88.3  --   --  81.8  --   --   --   --   --   --   < > = values in this interval not displayed.  Recent Labs Lab 09/27/12 1850  GLUCAP 112*    CXR: pending.  ASSESSMENT / PLAN:  PULMONARY A: Pulmonary edema and VDRF post op. Consider also HCAP (new fever 3/12) Probable OSA/OHS Hx PE/DVT P:   - continue current vent support - daily SBT's ; - CXR 3/13 still with significant pulm edema (improved but present), none today - continue diuresis - may need T-piece prior to extubation - on empiric abx for HCAP w recurrent fever, see ID section - heparin gtt for hx PE and for ACS, transition back to coumadin once stabilized  CARDIOVASCULAR A: Post op shock, likely propofol related, minimal blood loss > resolved Acute MI Acute cardiogenic pulm edema P:  - KVO IVF. - lasix to 80mg  q8h  On 3/13 - follow S Cr, BMP - appreciate cardiology assistance, received ASA, heparin gtt, metoprolol  RENAL A:  Hypokalemia hypophosphatemia P:   - replace electrolytes - follow BMET, UOP  GASTROINTESTINAL A:  Hernia repair. P:   - Plans per surgery  HEMATOLOGIC A:  No active issues.  P:  - follow CBC  INFECTIOUS  Recent Labs Lab 09/30/12 1718 10/01/12 0405  PROCALCITON 0.55 0.49  A:  B infiltrates, likely pulm edema but at risk for HCAP; pct reassuring, 0.49 on 3/14 P:   - cx obtained 3/13 - added HCAP coverage 3/13 after fever  ENDOCRINE A:  No history of diabetes.   P:   - Monitor.  NEUROLOGIC A:  Sedated and intubated. P:   - Continuous propofol  + fentanyl  I have personally obtained a history, examined the patient, evaluated laboratory and imaging results, formulated the assessment and plan and placed orders.  CRITICAL CARE: The patient is critically ill with multiple organ systems failure and requires high complexity decision making for assessment and support, frequent evaluation and titration of therapies, application of advanced monitoring technologies and extensive interpretation of multiple databases. Critical Care Time devoted to patient care services described in this note is 30 minutes.   Levy Pupa, MD, PhD 10/01/2012, 10:45 AM [facility] [phone] or if no answer [phone]

## 2012-10-01 NOTE — Progress Notes (Signed)
[facility] ADULT ICU REPLACEMENT PROTOCOL FOR AM LAB REPLACEMENT ONLY  The patient does apply for the [facility] Adult ICU Electrolyte Replacment Protocol based on the criteria listed below:   1. Is GFR >/= 40 ml/min? yes  Patient's GFR today is 90 2. Is urine output >/= 0.5 ml/kg/hr for the last 6 hours? yes Patient's UOP is 1.2 ml/kg/hr 3. Is BUN < 60 mg/dL? yes  Patient's BUN today is 9 4. Abnormal electrolyte(s):Potassium 5. Ordered repletion with: Potassium per protocol 6. If a panic level lab has been reported, has the CCM MD in charge been notified? no.   Physician:    Orland Dec P 10/01/2012 5:44 AM

## 2012-10-01 NOTE — Procedures (Signed)
Extubation Procedure Note  Patient Details:   Name: Breanna Wilkins DOB: Mar 10, 1961 MRN: [id number]   Airway Documentation:  Airway 7 mm (Active)  Secured at (cm) 22 cm 10/01/2012  8:00 AM  Measured From Lips 10/01/2012  8:00 AM  Secured Location Right 10/01/2012  8:00 AM  Secured By [employer] 10/01/2012  8:00 AM  Tube Holder Repositioned Yes 10/01/2012  8:00 AM  Cuff Pressure (cm H2O) 24 cm H2O 10/01/2012  3:13 AM  Site Condition Dry 10/01/2012  8:00 AM    Evaluation  O2 sats: transiently fell during during procedure and currently acceptable on 6 lpm nasal cannula. Complications: No apparent complications Patient did tolerate procedure well. Bilateral Breath Sounds: Clear;Diminished Suctioning: Airway Yes  Prior to extubation: pt suctioned and positive cuff leak noted.  Post-extubation: pt able to cough and produce sputum, state her name, and no stridor noted.    Antoine Poche 10/01/2012, 11:47 AM

## 2012-10-01 NOTE — Progress Notes (Signed)
ANTICOAGULATION CONSULT NOTE - Follow Up Consult  Pharmacy Consult for heparin Indication:STEMI s/p ventral hernia repair   Allergies  Allergen Reactions  . Aspirin     REACTION: Nausea (325mg )    Patient Measurements: Height: 5\' 7"  (170.2 cm) Weight: 307 lb 1.6 oz (139.3 kg) IBW/kg (Calculated) : 61.6 Heparin Dosing Weight: 95.7 kg  Vital Signs: Temp: 98.9 F (37.2 C) (03/14 1135) Temp src: Oral (03/14 1135) BP: 135/73 mmHg (03/14 1400) Pulse Rate: 88 (03/14 1400)  Labs:  Recent Labs  09/29/12 0720  09/30/12 0345  09/30/12 2230 10/01/12 0405 10/01/12 1255  HGB 12.9  --  12.0  --   --  11.8*  --   HCT 39.3  --  37.2  --   --  36.1  --   PLT 250  --  239  --   --  265  --   HEPARINUNFRC 0.24*  < >  --   < > 0.32 0.19* 0.32  CREATININE 0.87  --  0.91  --   --  0.95  --   < > = values in this interval not displayed.  Estimated Creatinine Clearance: 100.2 ml/min (by C-G formula based on Cr of 0.95).   Medications:  Scheduled:  . antiseptic oral rinse  15 mL Mouth Rinse QID  . aspirin  300 mg Rectal Daily  . ceFEPime (MAXIPIME) IV  2 g Intravenous Q8H  . chlorhexidine  15 mL Mouth Rinse BID  . furosemide  80 mg Intravenous Q8H  . [COMPLETED] heparin  2,000 Units Intravenous Once  . metoprolol  5 mg Intravenous Q6H  . pantoprazole (PROTONIX) IV  40 mg Intravenous Q24H  . [COMPLETED] potassium chloride  20 mEq Per Tube Q4H  . [COMPLETED] potassium chloride  40 mEq Oral Q4H  . [COMPLETED] potassium phosphate IVPB (mmol)  30 mmol Intravenous Once  . simvastatin  20 mg Oral q1800  . [COMPLETED] vancomycin  2,500 mg Intravenous Once   Followed by  . vancomycin  1,500 mg Intravenous Q12H  . [DISCONTINUED] feeding supplement (OSMOLITE 1.2 CAL)  1,000 mL Per Tube Q24H  . [DISCONTINUED] feeding supplement  30 mL Per Tube Custom    Assessment: 52 yo female on heparin gtt for new STEMI s/p hernia repair on 3/10. Patient was on Coumadin PTA for history of PE/DVT (last  dose on 3/4). Xa level therapeutic at 0.32 (previously inconsisitent heparin levels despite dose increases). No active bleeding today noted per RN, was having previous bloody secretions from ET tube. H/H and platelets are wnl/stable.  Goal of Therapy:  Heparin level 0.3-0.7 units/ml Monitor platelets by anticoagulation protocol: Yes   Plan:  Continue heparin infusion at 3000 units/hour F/u heparin level in 6 hours Monitor for s/sx of bleeding F/u daily HL and CBC  Tiney Rouge, PharmD Candidate  Agree with plan as noted above  Bola A. Wandra Feinstein D Clinical Pharmacist Pager:[phone] Phone [phone] 10/01/2012 3:46 PM

## 2012-10-02 ENCOUNTER — Inpatient Hospital Stay (HOSPITAL_COMMUNITY): Payer: Medicare Other

## 2012-10-02 DIAGNOSIS — Z7901 Long term (current) use of anticoagulants: Secondary | ICD-10-CM

## 2012-10-02 DIAGNOSIS — E669 Obesity, unspecified: Secondary | ICD-10-CM

## 2012-10-02 LAB — CBC
MCHC: 32.4 g/dL (ref 30.0–36.0)
RDW: 16 % — ABNORMAL HIGH (ref 11.5–15.5)

## 2012-10-02 LAB — BASIC METABOLIC PANEL
BUN: 33 mg/dL — ABNORMAL HIGH (ref 6–23)
Calcium: 9.6 mg/dL (ref 8.4–10.5)
Creatinine, Ser: 0.97 mg/dL (ref 0.50–1.10)
GFR calc non Af Amer: 66 mL/min — ABNORMAL LOW (ref >90)
Glucose, Bld: 107 mg/dL — ABNORMAL HIGH (ref 70–99)

## 2012-10-02 LAB — TRIGLYCERIDES: Triglycerides: 243 mg/dL — ABNORMAL HIGH (ref <150)

## 2012-10-02 MED ORDER — FUROSEMIDE 10 MG/ML IJ SOLN
80.0000 mg | Freq: Two times a day (BID) | INTRAMUSCULAR | Status: DC
  Administered 2012-10-02 – 2012-10-07 (×9): 80 mg via INTRAVENOUS
  Filled 2012-10-02 (×11): qty 8

## 2012-10-02 NOTE — Progress Notes (Addendum)
ANTICOAGULATION CONSULT NOTE - Follow Up Consult  Pharmacy Consult for Heparin Indication: STEMI s/p ventral hernia repair  Allergies  Allergen Reactions  . Aspirin     REACTION: Nausea (325mg )    Patient Measurements: Height: 5\' 7"  (170.2 cm) Weight: 289 lb 7.4 oz (131.3 kg) IBW/kg (Calculated) : 61.6 Heparin Dosing Weight: 95.7 kg  Vital Signs: Temp: 98.3 F (36.8 C) (03/15 0400) Temp src: Oral (03/15 0400) BP: 120/71 mmHg (03/15 1000) Pulse Rate: 84 (03/15 1000)  Labs:  Recent Labs  09/30/12 0345  10/01/12 0405 10/01/12 1255 10/01/12 2209 10/02/12 0505  HGB 12.0  --  11.8*  --   --   --   HCT 37.2  --  36.1  --   --   --   PLT 239  --  265  --   --   --   HEPARINUNFRC  --   < > 0.19* 0.32 0.39 0.45  CREATININE 0.91  --  0.95  --  0.99 0.97  < > = values in this interval not displayed.  Estimated Creatinine Clearance: 94.8 ml/min (by C-G formula based on Cr of 0.97).  Medications:  Infusions:  . sodium chloride 10 mL/hr at 09/30/12 1800  . heparin 3,000 Units/hr (10/02/12 1610)  . [DISCONTINUED] propofol 15 mcg/kg/min (10/01/12 0700)    Assessment: 52 yo female on heparin gtt for STEMI s/p hernia repair on 3/10. Patient was on Coumadin PTA for history of PE/DVT (last dose on 3/4). Heparin level is therapeutic at 0.45. No bleeding noted, had previous bloody secretions from ET tube. Daily CBC order was discontinued for unknown reason, will attempt to add-on to am labs or place order for today.  Goal of Therapy:  Heparin level 0.3-0.7 units/ml Monitor platelets by anticoagulation protocol: Yes   Plan:  -Continue heparin drip at 3000 units/hr -Daily heparin level and CBC while on heparin -Monitor for signs/symptoms of bleeding  [facility], [place].D., BCPS Clinical Pharmacist Pager: [phone] 10/02/2012 10:29 AM   Addendum: H/H 11.9/36.7 pltc 303  CBC stable.  [place], [address].D., BCPS Clinical Pharmacist Pager: [phone] 10/02/2012 3:03  PM

## 2012-10-02 NOTE — Progress Notes (Signed)
SUBJECTIVE:  No complaints of chest pain  OBJECTIVE:   Vitals:   Filed Vitals:   10/02/12 0800 10/02/12 0900 10/02/12 1000 10/02/12 1100  BP: 100/82 106/75 120/71 109/58  Pulse: 82 84 84 92  Temp:      TempSrc:      Resp: 27 21 18 20   Height:      Weight:      SpO2: 96% 94% 95% 95%   I&O's:   Intake/Output Summary (Last 24 hours) at 10/02/12 1207 Last data filed at 10/02/12 0600  Gross per 24 hour  Intake   1590 ml  Output   1200 ml  Net    390 ml   TELEMETRY: Reviewed telemetry pt in NSR:     PHYSICAL EXAM General: Well developed, well nourished, in no acute distress Head: Eyes PERRLA, No xanthomas.   Normal cephalic and atramatic  Lungs:   Crackles at bases. Heart:   HRRR S1 S2 Pulses are 2+ & equal. Abdomen: Bowel sounds are positive, abdomen soft and non-tender without masses Extremities:   No clubbing, cyanosis or edema.  DP +1 Neuro: Alert and oriented X 3. Psych:  Good affect, responds appropriately   LABS: Basic Metabolic Panel:  Recent Labs  16/10/96 0345 10/01/12 0405 10/01/12 2209 10/02/12 0505  NA 138 138 141 141  K 3.4* 3.5 3.8 3.6  CL 98 97 101 102  CO2 27 28 25 28   GLUCOSE 117* 123* 111* 107*  BUN 12 25* 34* 33*  CREATININE 0.91 0.95 0.99 0.97  CALCIUM 8.8 9.2 9.6 9.6  MG  --  2.3  --   --   PHOS 2.1* 2.9  --   --    Liver Function Tests:  Recent Labs  09/30/12 0345  AST 19  ALT 6  ALKPHOS 71  BILITOT 0.9  PROT 7.6  ALBUMIN 2.6*   No results found for this basename: LIPASE, AMYLASE,  in the last 72 hours CBC:  Recent Labs  09/30/12 0345 10/01/12 0405  WBC 12.4* 10.9*  HGB 12.0 11.8*  HCT 37.2 36.1  MCV 84.2 84.1  PLT 239 265   Cardiac Enzymes: No results found for this basename: CKTOTAL, CKMB, CKMBINDEX, TROPONINI,  in the last 72 hours BNP: No components found with this basename: POCBNP,  D-Dimer: No results found for this basename: DDIMER,  in the last 72 hours Hemoglobin A1C: No results found for this  basename: HGBA1C,  in the last 72 hours Fasting Lipid Panel:  Recent Labs  10/02/12 0505  TRIG 243*   Thyroid Function Tests: No results found for this basename: TSH, T4TOTAL, FREET3, T3FREE, THYROIDAB,  in the last 72 hours Anemia Panel: No results found for this basename: VITAMINB12, FOLATE, FERRITIN, TIBC, IRON, RETICCTPCT,  in the last 72 hours Coag Panel:   Lab Results  Component Value Date   INR 1.07 09/27/2012   INR 2.39* 09/20/2012   INR 2.60 09/13/2012    RADIOLOGY: Chest 2 View  09/20/2012  *RADIOLOGY REPORT*  Clinical Data: Preop for hernia repair  CHEST - 2 VIEW  Comparison: 10/17/2010  Findings: Cardiomediastinal silhouette is stable.  No acute infiltrate or pleural effusion.  No pulmonary edema.  Mild degenerative changes thoracic spine.  IMPRESSION:  No active disease.  Mild degenerative changes thoracic spine.   Original Report Authenticated By: Natasha Mead, M.D.    Dg Chest Port 1 View  10/02/2012  *RADIOLOGY REPORT*  Clinical Data: Infiltrates  PORTABLE CHEST - 1 VIEW  Comparison: One-view  chest 10/01/2012  Findings: Cardiac enlargement is stable.  Mild interstitial edema is present.  Aeration continues to improve.  The lung volumes are low.  Minimal bibasilar atelectasis is evident. The endotracheal tube and NG tube have been removed.  IMPRESSION:  1.  Slight improved aeration. 2.  Persistent cardiomegaly and mild edema. 3.  Interval extubation.   Original Report Authenticated By: Marin Roberts, M.D.    Dg Chest Port 1 View  10/01/2012  *RADIOLOGY REPORT*  Clinical Data: Bilateral pulmonary infiltrates.  PORTABLE CHEST - 1 VIEW  Comparison: 09/30/2012  Findings: Endotracheal tube and NG tube are in good position. There has been significant improvement in the extensive bilateral pulmonary infiltrates.  No effusions.  Heart size and vascularity are normal.  IMPRESSION: Significant improvement in the bilateral pulmonary infiltrates.   Original Report Authenticated By: Francene Boyers, M.D.    Dg Chest Port 1 View  09/30/2012  *RADIOLOGY REPORT*  Clinical Data: Evaluate endotracheal tube positioning  PORTABLE CHEST - 1 VIEW  Comparison: 09/29/2012; 09/28/2012  Findings: Grossly unchanged large cardiac silhouette and mediastinal contours.  Stable position support apparatus.  No pneumothorax.  Lung volumes remain persistently reduced.  Pulmonary vasculature is indistinct.  Grossly unchanged perihilar and bibasilar heterogeneous / consolidative opacities, left greater than right.  No definite pleural effusion.  Unchanged bones.  IMPRESSION: 1.  Stable positioning of support apparatus.  No pneumothorax. 2.  Grossly unchanged findings of pulmonary edema and bilateral air space opacities, left greater than right, atelectasis versus infiltrate.   Original Report Authenticated By: Tacey Ruiz, MD    Dg Chest Port 1 View  09/29/2012  *RADIOLOGY REPORT*  Clinical Data: Pulmonary edema.  Fever.  PORTABLE CHEST - 1 VIEW  Comparison: 03/11 and 09/27/2012  Findings: Endotracheal tube is in good position.  Tip of the NG tube is below the diaphragm. Slight improvement in the bilateral pulmonary edema.  No effusions.  IMPRESSION: Slight improvement in pulmonary edema.   Original Report Authenticated By: Francene Boyers, M.D.    Dg Chest Port 1 View  09/28/2012  *RADIOLOGY REPORT*  Clinical Data: Evaluate endotracheal tube position.  PORTABLE CHEST - 1 VIEW  Comparison: Chest x-ray of 09/27/2012.  Findings: An endotracheal tube is in place with tip 3.9 cm above the carina. A nasogastric tube is seen extending into the stomach, however, the tip of the nasogastric tube extends below the lower margin of the image.  Lung volumes are low.  Widespread interstitial and airspace disease throughout all aspects of the lungs bilaterally, with perihilar predominance, concerning for moderate to severe pulmonary edema.  Additional superimposed atelectasis in the left lower lobe.  Possible small left pleural  effusion.  Mild cardiomegaly is unchanged. The patient is rotated to the left on today's exam, resulting in distortion of the mediastinal contours and reduced diagnostic sensitivity and specificity for mediastinal pathology.  IMPRESSION: 1.  Support apparatus, as above. 2.  Findings, as above, concerning for congestive heart failure, possibly with small left pleural effusion and associated atelectasis in the left lower lobe.   Original Report Authenticated By: Trudie Reed, M.D.    Dg Chest Port 1 View  09/27/2012  *RADIOLOGY REPORT*  Clinical Data: Respiratory difficulty  PORTABLE CHEST - 1 VIEW  Comparison: 09/20/2012  Findings: Mild cardiomegaly.  Endotracheal tube placed.  Tip is 5.1 cm from the carina.  Central airspace disease and vascular congestion.  Left basilar pulmonary opacity.  Gastric distention. No pneumothorax.  IMPRESSION: Central airspace disease and vascular congestion  compatible with pulmonary edema.  Left basilar consolidation likely a combination of edema and volume loss.  Gastric distention.  The patient may benefit from an NG tube.   Original Report Authenticated By: Jolaine Click, M.D.       ASSESSMENT:  1.  Acute periop Lateral wall MI on IV Heparin gtt/ASA/beta blocker 2.  Acute mixed systolic/diastolic CHF on IV diuretics 3.  Reduced LVF on echo 4.  S/p laparoscopic ventral hernia repair with mesh  PLAN:   1.Continue Lasix for diuresis - she still has crackles at bases, renal function stable 2.  Continue IV Lopressor until taking PO and then switch to PO 3. Start ACE I when taking PO  Quintella Reichert, MD  10/02/2012  12:07 PM

## 2012-10-02 NOTE — Progress Notes (Signed)
5 Days Post-Op  Subjective: Pt doing well.  Extubated last night.  Passing gas and had a small BM yesterday  Objective: Vital signs in last 24 hours: Temp:  [98.1 F (36.7 C)-98.9 F (37.2 C)] 98.3 F (36.8 C) (03/15 0400) Pulse Rate:  [75-98] 89 (03/15 0700) Resp:  [16-34] 16 (03/15 0700) BP: (92-138)/(55-96) 120/76 mmHg (03/15 0700) SpO2:  [92 %-97 %] 96 % (03/15 0700) FiO2 (%):  [40 %] 40 % (03/14 1100) Weight:  [289 lb 7.4 oz (131.3 kg)] 289 lb 7.4 oz (131.3 kg) (03/15 0500) Last BM Date:  (family states prior to admission)  Intake/Output from previous day: 03/14 0701 - 03/15 0700 In: 1882.6 [I.V.:952.6; NG/GT:30; IV Piggyback:900] Out: 2200 [Urine:2150; Emesis/NG output:50] Intake/Output this shift:    General appearance: alert and cooperative GI: soft, non-tender; bowel sounds normal; no masses,  no organomegaly  Lab Results:   Recent Labs  09/30/12 0345 10/01/12 0405  WBC 12.4* 10.9*  HGB 12.0 11.8*  HCT 37.2 36.1  PLT 239 265   BMET  Recent Labs  10/01/12 2209 10/02/12 0505  NA 141 141  K 3.8 3.6  CL 101 102  CO2 25 28  GLUCOSE 111* 107*  BUN 34* 33*  CREATININE 0.99 0.97  CALCIUM 9.6 9.6   PT/INR No results found for this basename: LABPROT, INR,  in the last 72 hours ABG No results found for this basename: PHART, PCO2, PO2, HCO3,  in the last 72 hours  Studies/Results: Dg Chest Port 1 View  10/02/2012  *RADIOLOGY REPORT*  Clinical Data: Infiltrates  PORTABLE CHEST - 1 VIEW  Comparison: One-view chest 10/01/2012  Findings: Cardiac enlargement is stable.  Mild interstitial edema is present.  Aeration continues to improve.  The lung volumes are low.  Minimal bibasilar atelectasis is evident. The endotracheal tube and NG tube have been removed.  IMPRESSION:  1.  Slight improved aeration. 2.  Persistent cardiomegaly and mild edema. 3.  Interval extubation.   Original Report Authenticated By: Marin Roberts, M.D.    Dg Chest Port 1  View  10/01/2012  *RADIOLOGY REPORT*  Clinical Data: Bilateral pulmonary infiltrates.  PORTABLE CHEST - 1 VIEW  Comparison: 09/30/2012  Findings: Endotracheal tube and NG tube are in good position. There has been significant improvement in the extensive bilateral pulmonary infiltrates.  No effusions.  Heart size and vascularity are normal.  IMPRESSION: Significant improvement in the bilateral pulmonary infiltrates.   Original Report Authenticated By: Francene Boyers, M.D.     Anti-infectives: Anti-infectives   Start     Dose/Rate Route Frequency Ordered Stop   10/01/12 0500  vancomycin (VANCOCIN) 1,500 mg in sodium chloride 0.9 % 500 mL IVPB     1,500 mg 250 mL/hr over 120 Minutes Intravenous Every 12 hours 09/30/12 1635     09/30/12 1730  ceFEPIme (MAXIPIME) 2 g in dextrose 5 % 50 mL IVPB     2 g 100 mL/hr over 30 Minutes Intravenous Every 8 hours 09/30/12 1635     09/30/12 1700  vancomycin (VANCOCIN) 2,500 mg in sodium chloride 0.9 % 500 mL IVPB     2,500 mg 250 mL/hr over 120 Minutes Intravenous  Once 09/30/12 1635 09/30/12 2003   09/27/12 2100  ceFAZolin (ANCEF) 3 g in dextrose 5 % 50 mL IVPB     3 g 160 mL/hr over 30 Minutes Intravenous Every 6 hours 09/27/12 1959 09/28/12 0930   09/27/12 0600  ceFAZolin (ANCEF) 3 g in dextrose 5 % 50 mL  IVPB     3 g 160 mL/hr over 30 Minutes Intravenous On call to O.R. 09/26/12 1246 09/27/12 1115      Assessment/Plan: s/p Procedure(s): LAPAROSCOPIC INCISIONAL HERNIA possible open (N/A) INSERTION OF MESH (N/A) POD #5. Laparoscopic ventral hernia repair with mesh. No obvious surgical complications.  Follow hemoglobin carefully due to heparin drip . No bleeding currently.  Switch to oral diet -start with full liq  Acute perioperative lateral STEMI., without hemodynamic compromise. No chest pain.  Cardiology has elected noninvasive approach until extubated, then will need cath   Pulmonary edema, presumably cardiogenic.Marland Kitchen Receiving Lasix and fluid  restriction. This has obviously slowed down plans for weaning. Sounds better on exam today.  Fever. Suspect pulmonary origin, Blood and urine cultures ordered. On Vanc./Maxipine empirically per CCM for possible HCAP.  Nutrition.  Will start Full liq diet   History pulmonary embolism, on chronic Coumadin As an outpatient.   History of sleep apnea   History hypertension-On Lopressor and lisinopril.   On PPI for ulcer prophylaxis.   Morbid obesity   Will place on Hospitalist team due to her mult medical problems at this point. Will con't to follow    LOS: 5 days    Marigene Ehlers., [facility] 10/02/2012

## 2012-10-02 NOTE — Progress Notes (Signed)
PULMONARY  / CRITICAL CARE MEDICINE  Name: Breanna Wilkins MRN: [id number] DOB: 08/18/1960    ADMISSION DATE:  09/27/2012 CONSULTATION DATE:  09/27/12  REFERRING MD :  [facility]-Dr. Derrell Lolling.  CHIEF COMPLAINT:  VDRF.  BRIEF PATIENT DESCRIPTION: 52 yo with PE on Coumadin and CHF admitted for ventral hernia repair.  Post op course complicated by STEMI, pulmonary edema and VDRF.  SIGNIFICANT EVENTS / STUDIES:  3/10  Hernia repair, complicated by STEMI and VDRF 3/11  2D echo >>> Acute systolic / diastolic CHF 3/15  Extubated  LINES / TUBES: ETT 3/10 >>> 3/15   CULTURES: MRSA 3/10 >>> neg Urine 3/10 >>> neg Urine 3/13 >>> neg Blood 3/13 >>   ANTIBIOTICS: Cefepime 3/13 >>> Vancomycin 3/14 >>>  SUBJECTIVE:  Extubated yesterday.  o issues overnight.  Noncompliant with CPAP.  VITAL SIGNS: Temp:  [98.1 F (36.7 C)-98.9 F (37.2 C)] 98.3 F (36.8 C) (03/15 0400) Pulse Rate:  [75-98] 90 (03/15 0605) Resp:  [18-44] 22 (03/15 0605) BP: (92-138)/(55-96) 122/72 mmHg (03/15 0605) SpO2:  [92 %-97 %] 95 % (03/15 0605) FiO2 (%):  [40 %] 40 % (03/14 1100) Weight:  [131.3 kg (289 lb 7.4 oz)] 131.3 kg (289 lb 7.4 oz) (03/15 0500) HEMODYNAMICS:   VENTILATOR SETTINGS: Vent Mode:  [-]  FiO2 (%):  [40 %] 40 %  INTAKE / OUTPUT: Intake/Output     03/14 0701 - 03/15 0700 03/15 0701 - 03/16 0700   I.V. (mL/kg) 952.6 (7.3)    NG/GT 30    IV Piggyback 900    Total Intake(mL/kg) 1882.6 (14.3)    Urine (mL/kg/hr) 2150 (0.7)    Emesis/NG output 50 (0)    Total Output 2200     Net -317.4          Urine Occurrence 4 x    Stool Occurrence 2 x     PHYSICAL EXAMINATION: General:  Resting in the chair, no distress Neuro:  Awake, alert, nonfocal HEENT:  PERRL Cardiovascular:  RRR, no murmurs Lungs:  Bilateral air entry, diminished Abdomen:  Obese, nontender Musculoskeletal:  No edema, moves all extremities Skin:  Intact.  LABS:  Recent Labs Lab 09/27/12 0922 09/27/12 1602  09/27/12 1727  09/27/12 1950 09/27/12 2228  09/28/12 0430 09/28/12 0445  09/29/12 0720 09/30/12 0345 09/30/12 1718 10/01/12 0405 10/01/12 2209 10/02/12 0505  HGB  --   --   < >  --   --   --   < >  --   --   < > 12.9 12.0  --  11.8*  --   --   WBC  --   --   < >  --   --   --   < >  --   --   < > 10.8* 12.4*  --  10.9*  --   --   PLT  --   --   < >  --   --   --   < >  --   --   < > 250 239  --  265  --   --   NA  --   --   < >  --   --   --   --   --  137  < > 139 138  --  138 141 141  K  --   --   < >  --   --   --   --   --  3.8  < >  3.0* 3.4*  --  3.5 3.8 3.6  CL  --   --   < >  --   --   --   --   --  101  < > 98 98  --  97 101 102  CO2  --   --   < >  --   --   --   --   --  27  < > 29 27  --  28 25 28   GLUCOSE  --   --   < >  --   --   --   --   --  136*  < > 111* 117*  --  123* 111* 107*  BUN  --   --   < >  --   --   --   --   --  12  < > 10 12  --  25* 34* 33*  CREATININE  --   --   < >  --   --   --   --   --  0.80  < > 0.87 0.91  --  0.95 0.99 0.97  CALCIUM  --   --   < >  --   --   --   --   --  8.5  < > 8.8 8.8  --  9.2 9.6 9.6  MG  --   --   < >  --   --   --   --   --  1.6  --  2.0  --   --  2.3  --   --   PHOS  --   --   < >  --   --   --   --   --  2.8  --  1.8* 2.1*  --  2.9  --   --   AST  --   --   --   --   --   --   --   --   --   --   --  19  --   --   --   --   ALT  --   --   --   --   --   --   --   --   --   --   --  6  --   --   --   --   ALKPHOS  --   --   --   --   --   --   --   --   --   --   --  71  --   --   --   --   BILITOT  --   --   --   --   --   --   --   --   --   --   --  0.9  --   --   --   --   PROT  --   --   --   --   --   --   --   --   --   --   --  7.6  --   --   --   --   ALBUMIN  --   --   --   --   --   --   --   --   --   --   --  2.6*  --   --   --   --  INR 1.07  --   --   --   --   --   --   --   --   --   --   --   --   --   --   --   TROPONINI  --   --   --  3.73*  --  4.56*  --   --  2.92*  --   --   --   --   --   --   --   PROCALCITON  --    --   --   --   --   --   --   --   --   --   --   --  0.55 0.49  --  0.31  PROBNP  --   --   --   --   --   --   --   --   --   --   --  2211.0*  --  964.6*  --   --   PHART  --  7.201*  --   --  7.349*  --   --  7.410  --   --   --   --   --   --   --   --   PCO2ART  --  66.9*  --   --  47.3*  --   --  44.6  --   --   --   --   --   --   --   --   PO2ART  --  87.1  --   --  88.3  --   --  81.8  --   --   --   --   --   --   --   --   < > = values in this interval not displayed.  Recent Labs Lab 09/27/12 1850 10/01/12 1505  GLUCAP 112* 104*   CXR: None today  ASSESSMENT / PLAN:  PULMONARY A: Pulmonary edema and VDRF in setting of STEMI - resolving. Suspected HCAP.  OSA/OHS on home CPAP, noncompliant. PE/DVT, preadmission. P:   CXR today, if airspace disease improved and afebrile, consider d/c abx Continue Heparin, restart Coumadin when able to take PO (per CCS) Nocturnal CPAP  CARDIOVASCULAR A: Postoperative STEMI.  Acute systolic / diastolic heart failure. P:  ASA, Metoprolol Zocor and low dose ACEI when able to take PO Further workup per cardiology  RENAL A:  Hypokalemia. Fluid overload. P:   K replacement protocol Trend BMP Decrease Lasix to 80 IV q12h  GASTROINTESTINAL A:  Hernia repair. P:   NPO Per CCS D/c Protonix as extubated / no vasopressors  HEMATOLOGIC A:  No active issues.  P:  Trend CBC DVT PX is not indicated (on Heparin gtt)  INFECTIOUS A:  Suspected HCAP. P: Antibiotics as above Check PCT  ENDOCRINE A:  No active issues. P:   No intervention required  NEUROLOGIC A:  No active issues. P:   No interventions required  I have personally obtained a history, examined the patient, evaluated laboratory and imaging results, formulated the assessment and plan and placed orders.  CLINICAL SUMMARY: 52 yo with PE on Coumadin and CHF admitted for ventral hernia repair.  Post op course complicated by STEMI, pulmonary edema and VDRF.  Extubated  3/14, no issues overnight, nocturnal CAPA (as preadmission for OSA). Will continue anticoagulation, diuresis and antibiotics for now.  CXR today to follow.  ACEI, Coumadin when able to take PO.  Cardiology and CCS following.  SDU under TRH in the afternoon if remains stable.  I have personally obtained a history, examined the patient, evaluated laboratory and imaging results, formulated the assessment and plan and placed orders.  Lonia Farber, MD Pulmonary and Critical Care Medicine [facility] Pager: [phone]  10/02/2012, 7:40 AM

## 2012-10-03 ENCOUNTER — Encounter (HOSPITAL_COMMUNITY): Payer: Self-pay

## 2012-10-03 DIAGNOSIS — R0989 Other specified symptoms and signs involving the circulatory and respiratory systems: Secondary | ICD-10-CM

## 2012-10-03 LAB — CBC
MCHC: 32.8 g/dL (ref 30.0–36.0)
MCV: 84.8 fL (ref 78.0–100.0)
Platelets: 286 10*3/uL (ref 150–400)
RDW: 15.7 % — ABNORMAL HIGH (ref 11.5–15.5)
WBC: 9.8 10*3/uL (ref 4.0–10.5)

## 2012-10-03 LAB — HEPARIN LEVEL (UNFRACTIONATED): Heparin Unfractionated: 0.52 IU/mL (ref 0.30–0.70)

## 2012-10-03 NOTE — Progress Notes (Signed)
PULMONARY  / CRITICAL CARE MEDICINE  Name: Breanna Wilkins MRN: [id number] DOB: [birth year]    ADMISSION DATE:  09/27/2012 CONSULTATION DATE:  09/27/12  REFERRING MD :  [facility]-Dr. Derrell Lolling.  CHIEF COMPLAINT:  VDRF.  BRIEF PATIENT DESCRIPTION: 52 yo with PE on Coumadin and CHF admitted for ventral hernia repair.  Post op course complicated by STEMI, pulmonary edema and VDRF.  SIGNIFICANT EVENTS / STUDIES:  3/10  Hernia repair, complicated by STEMI and VDRF 3/11  2D echo >>> Acute systolic / diastolic CHF 3/15  Extubated  LINES / TUBES: ETT 3/10 >>> 3/15   CULTURES: MRSA 3/10 >>> neg Urine 3/10 >>> neg Urine 3/13 >>> neg Blood 3/13 >>   ANTIBIOTICS: Cefepime 3/13 >>> Vancomycin 3/14 >>>  SUBJECTIVE:  Stable overnight with no increased wob.  Pt tells me she did well with cpap last night  VITAL SIGNS: Temp:  [97.3 F (36.3 C)-98.8 F (37.1 C)] 98.2 F (36.8 C) (03/16 1134) Pulse Rate:  [71-98] 80 (03/16 1134) Resp:  [15-30] 23 (03/16 1134) BP: (97-126)/(65-90) 122/90 mmHg (03/16 1134) SpO2:  [89 %-98 %] 96 % (03/16 1134) Weight:  [138 kg (304 lb 3.8 oz)-138.1 kg (304 lb 7.3 oz)] 138 kg (304 lb 3.8 oz) (03/16 0000)  INTAKE / OUTPUT: Intake/Output     03/15 0701 - 03/16 0700 03/16 0701 - 03/17 0700   P.O.  470   I.V. (mL/kg) 930 (6.7) 160 (1.2)   NG/GT     IV Piggyback 1100 100   Total Intake(mL/kg) 2030 (14.7) 730 (5.3)   Urine (mL/kg/hr) 1900 (0.6) 800 (1.1)   Emesis/NG output     Total Output 1900 800   Net +130 -70        Urine Occurrence 501 x     PHYSICAL EXAMINATION: General:  Morbidly obese female, no distress Neuro:  Awake, alert, moves all 4 HEENT:  Nose without purulence or d/c noted. Cardiovascular:  RRR, no murmurs Lungs:  Decreased depth inspiration, clear bs Abdomen:  Obese, nontender Musculoskeletal:  Mild edema, no cyanosis   ASSESSMENT / PLAN:  PULMONARY A: Pulmonary edema and VDRF in setting of STEMI - resolving. Suspected HCAP??.   OSA/OHS on home CPAP, noncompliant. PE/DVT, preadmission. P:   CXR yest with improved aeration and no definite infiltrate.  Does not require antibiotics from a pulmonary standpoint.  If not required from a surgical standpoint, can d/c.  Continue Heparin, restart Coumadin when able to take PO (per [facility]) Nocturnal CPAP  CARDIOVASCULAR A: Postoperative STEMI.  Acute systolic / diastolic heart failure. P:  Further workup per cardiology   CLINICAL SUMMARY: 52 yo with PE on Coumadin and CHF admitted for ventral hernia repair.  Post op course complicated by STEMI, pulmonary edema and VDRF.  Extubated 3/14, no issues overnight, nocturnal CAPA (as preadmission for OSA). Will continue anticoagulation, diuresis and antibiotics for now.  CXR today to follow.  ACEI, Coumadin when able to take PO.  Cardiology and [facility] following.  SDU under TR[facility] in the afternoon if remains stable.

## 2012-10-03 NOTE — Progress Notes (Signed)
PT placed on cpap with 4 liters 02 using a full face mask

## 2012-10-03 NOTE — Progress Notes (Signed)
General Surgery Note  LOS: 6 days  POD -  6 Days Post-Op  Assessment/Plan: 1.  LAPAROSCOPIC INCISIONAL HERNIA possible open, INSERTION OF MESH - 09/27/2012 - H. Derrell Lolling  Tolerating full liquids, but will leave there.  2.  Acute perioperative lateral STEMI., without hemodynamic compromise.  Highest troponin - 4.56 3.  Acute mixed systolic/diastolic CHF on IV diuretics 4.  History pulmonary embolism,   on chronic Coumadin at home  On Heparin drip now - Heparin level - 0.52 5.  History of sleep apnea  6.  History hypertension 7.  Morbid obesity 8.  On Vanc/Maxipime - this was started for possible HCAP - question if this is still necessary.  Subjective:  Okay on full liquids.  Not ready to go to reg diet.  Partner - Adair Laundry - at bedside. Objective:   Filed Vitals:   10/03/12 0800  BP: 103/71  Pulse: 71  Temp: 97.3 F (36.3 C)  Resp: 20     Intake/Output from previous day:  03/15 0701 - 03/16 0700 In: 2030 [I.V.:930; IV Piggyback:1100] Out: 1900 [Urine:1900]  Intake/Output this shift:  Total I/O In: 650 [P.O.:470; I.V.:80; IV Piggyback:100] Out: 800 [Urine:800]   Physical Exam:   General: Morbidly obese AAF who is alert and oriented.    HEENT: Normal. Pupils equal. .   Lungs: Clear   Abdomen: Soft, but very large.  Few BS.  Wounds okay.   Lab Results:    Recent Labs  10/02/12 1201 10/03/12 0435  WBC 9.7 9.8  HGB 11.9* 11.3*  HCT 36.7 34.5*  PLT 303 286    BMET   Recent Labs  10/01/12 2209 10/02/12 0505  NA 141 141  K 3.8 3.6  CL 101 102  CO2 25 28  GLUCOSE 111* 107*  BUN 34* 33*  CREATININE 0.99 0.97  CALCIUM 9.6 9.6    PT/INR  No results found for this basename: LABPROT, INR,  in the last 72 hours  ABG  No results found for this basename: PHART, PCO2, PO2, HCO3,  in the last 72 hours   Studies/Results:  Dg Chest Port 1 View  10/02/2012  *RADIOLOGY REPORT*  Clinical Data: Infiltrates  PORTABLE CHEST - 1 VIEW  Comparison: One-view chest  10/01/2012  Findings: Cardiac enlargement is stable.  Mild interstitial edema is present.  Aeration continues to improve.  The lung volumes are low.  Minimal bibasilar atelectasis is evident. The endotracheal tube and NG tube have been removed.  IMPRESSION:  1.  Slight improved aeration. 2.  Persistent cardiomegaly and mild edema. 3.  Interval extubation.   Original Report Authenticated By: Marin Roberts, M.D.    Dg Chest Port 1 View  10/01/2012  *RADIOLOGY REPORT*  Clinical Data: Bilateral pulmonary infiltrates.  PORTABLE CHEST - 1 VIEW  Comparison: 09/30/2012  Findings: Endotracheal tube and NG tube are in good position. There has been significant improvement in the extensive bilateral pulmonary infiltrates.  No effusions.  Heart size and vascularity are normal.  IMPRESSION: Significant improvement in the bilateral pulmonary infiltrates.   Original Report Authenticated By: Francene Boyers, M.D.      Anti-infectives:   Anti-infectives   Start     Dose/Rate Route Frequency Ordered Stop   10/01/12 0500  vancomycin (VANCOCIN) 1,500 mg in sodium chloride 0.9 % 500 mL IVPB     1,500 mg 250 mL/hr over 120 Minutes Intravenous Every 12 hours 09/30/12 1635     09/30/12 1730  ceFEPIme (MAXIPIME) 2 g in dextrose 5 % 50  mL IVPB     2 g 100 mL/hr over 30 Minutes Intravenous Every 8 hours 09/30/12 1635     09/30/12 1700  vancomycin (VANCOCIN) 2,500 mg in sodium chloride 0.9 % 500 mL IVPB     2,500 mg 250 mL/hr over 120 Minutes Intravenous  Once 09/30/12 1635 09/30/12 2003   09/27/12 2100  ceFAZolin (ANCEF) 3 g in dextrose 5 % 50 mL IVPB     3 g 160 mL/hr over 30 Minutes Intravenous Every 6 hours 09/27/12 1959 09/28/12 0930   09/27/12 0600  ceFAZolin (ANCEF) 3 g in dextrose 5 % 50 mL IVPB     3 g 160 mL/hr over 30 Minutes Intravenous On call to O.R. 09/26/12 1246 09/27/12 1115      Ovidio Kin, MD, FACS Pager: [phone],   [facility]: [phone] 10/03/2012

## 2012-10-03 NOTE — Progress Notes (Signed)
[facility] Progress Note Breanna Wilkins TEAM 1 - Stepdown/ICU TEAM   Breanna Wilkins RUE:[id number] DOB: [birth year] DOA: 09/27/2012 PCP: Carrolyn Meiers, MD  Brief narrative: This is a 52 year old female who underwent a ventral hernia repair on 3/10. Postop course was complicated by respiratory distress requiring reintubation. Cardiac enzymes were noted to be elevated and she was diagnosed with a perioperative MI with resultant pulmonary edema. A cardiology consult was requested and a conservative approach was taken with initiation of diuretics, beta blockers, IV heparin and aspirin. An echo revealed severe hypokinesis of the inferior segment and the inferolateral segment of the mid LV along with hypokinesis of the apical aspect of the RV free wall. Moderate LVH was noted as well. She was extubated yesterday and transferred to the [facility].   Assessment/Plan: Principal Problem:  VDRF  (A) Pulmonary edema in setting of STEMI  (B) Also suspecting HCAP - continue diuretics per Cardiology -continue antibiotics for minimum 7 days  Active Problems: Acute on chronic systolic and diastolic CHF - management per cardiology as above - Resume ACE when BP improves (boderlne low)    Warfarin anticoagulation for h/o DVT and PE Currently receiving Heparin     Ventral hernia with obstruction S/p repair Cont clear liquids and advance diet per surgery  Morbid obestiy  Obstructive sleep apnea  Code Status: full code Family Communication: discussed with patient who is alert Disposition Plan: follow in SDU  Consultants: Cardiology Surgery  Procedures: 3/10 Ventral Hernia repair 3/10- 3/15 intubate and Extubated   Antibiotics: Ancef 3/10- 3/11 Cefepime 3/13 >> Vancomycin 3/13>>  DVT prophylaxis: IV Heparin  HPI/Subjective: Pt admits to ongoing cough productive of green mucous, tolerating diet, no abdominal pain   Objective: Blood pressure 116/66, pulse 80,  temperature 98 F (36.7 C), temperature source Oral, resp. rate 18, height 5\' 7"  (1.702 m), weight 138 kg (304 lb 3.8 oz), SpO2 99.00%.  Intake/Output Summary (Last 24 hours) at 10/03/12 1713 Last data filed at 10/03/12 1400  Gross per 24 hour  Intake   1880 ml  Output   2700 ml  Net   -820 ml     Exam: General: No acute respiratory distress Lungs: mild bibasilar crackles Cardiovascular: Regular rate and rhythm without murmur gallop or rub normal S1 and S2 Abdomen: Nontender, nondistended, soft, bowel sounds positive, no rebound, no ascites, no appreciable mass Extremities: No significant cyanosis, clubbing, or edema bilateral lower extremities  Data Reviewed: Basic Metabolic Panel:  Recent Labs Lab 09/27/12 1704 09/28/12 0445  09/29/12 0720 09/30/12 0345 10/01/12 0405 10/01/12 2209 10/02/12 0505  NA 138 137  < > 139 138 138 141 141  K 3.8 3.8  < > 3.0* 3.4* 3.5 3.8 3.6  CL 101 101  < > 98 98 97 101 102  CO2 27 27  < > 29 27 28 25 28   GLUCOSE 166* 136*  < > 111* 117* 123* 111* 107*  BUN 13 12  < > 10 12 25* 34* 33*  CREATININE 0.74 0.80  < > 0.87 0.91 0.95 0.99 0.97  CALCIUM 9.0 8.5  < > 8.8 8.8 9.2 9.6 9.6  MG 2.0 1.6  --  2.0  --  2.3  --   --   PHOS 4.4 2.8  --  1.8* 2.1* 2.9  --   --   < > = values in this interval not displayed. Liver Function Tests:  Recent Labs Lab 09/30/12 0345  AST 19  ALT 6  ALKPHOS 71  BILITOT 0.9  PROT 7.6  ALBUMIN 2.6*   No results found for this basename: LIPASE, AMYLASE,  in the last 168 hours No results found for this basename: AMMONIA,  in the last 168 hours CBC:  Recent Labs Lab 09/27/12 1704  09/29/12 0720 09/30/12 0345 10/01/12 0405 10/02/12 1201 10/03/12 0435  WBC 8.4  < > 10.8* 12.4* 10.9* 9.7 9.8  NEUTROABS 7.3  --   --   --   --   --   --   HGB 14.0  < > 12.9 12.0 11.8* 11.9* 11.3*  HCT 43.1  < > 39.3 37.2 36.1 36.7 34.5*  MCV 86.2  < > 82.6 84.2 84.1 85.0 84.8  PLT 274  < > 250 239 265 303 286  < > =  values in this interval not displayed. Cardiac Enzymes:  Recent Labs Lab 09/27/12 1727 09/27/12 2228 09/28/12 0445  TROPONINI 3.73* 4.56* 2.92*   BNP (last 3 results)  Recent Labs  01/20/12 1621 09/30/12 0345 10/01/12 0405  PROBNP 25.9 2211.0* 964.6*   CBG:  Recent Labs Lab 09/27/12 1850 10/01/12 1505  GLUCAP 112* 104*    Recent Results (from the past 240 hour(s))  URINE CULTURE     Status: None   Collection Time    09/27/12  6:59 PM      Result Value Range Status   Specimen Description URINE, CATHETERIZED   Final   Special Requests NONE   Final   Culture  Setup Time 09/28/2012 03:06   Final   Colony Count NO GROWTH   Final   Culture NO GROWTH   Final   Report Status 09/28/2012 FINAL   Final  MRSA PCR SCREENING     Status: None   Collection Time    09/27/12  7:03 PM      Result Value Range Status   MRSA by PCR NEGATIVE  NEGATIVE Final   Comment:            The GeneXpert MRSA Assay (FDA     approved for NASAL specimens     only), is one component of a     comprehensive MRSA colonization     surveillance program. It is not     intended to diagnose MRSA     infection nor to guide or     monitor treatment for     MRSA infections.  URINE CULTURE     Status: None   Collection Time    09/30/12  6:42 AM      Result Value Range Status   Specimen Description URINE, CATHETERIZED   Final   Special Requests Normal   Final   Culture  Setup Time 09/30/2012 07:06   Final   Colony Count NO GROWTH   Final   Culture NO GROWTH   Final   Report Status 10/01/2012 FINAL   Final  CULTURE, BLOOD (ROUTINE X 2)     Status: None   Collection Time    09/30/12 10:46 AM      Result Value Range Status   Specimen Description BLOOD ARM RIGHT   Final   Special Requests BOTTLES DRAWN AEROBIC AND ANAEROBIC 10CC   Final   Culture  Setup Time 09/30/2012 17:19   Final   Culture     Final   Value:        BLOOD CULTURE RECEIVED NO GROWTH TO DATE CULTURE WILL BE HELD FOR 5 DAYS BEFORE  ISSUING A FINAL NEGATIVE REPORT   Report  Status PENDING   Incomplete  CULTURE, BLOOD (ROUTINE X 2)     Status: None   Collection Time    09/30/12 10:50 AM      Result Value Range Status   Specimen Description BLOOD RIGHT ANTECUBITAL   Final   Special Requests BOTTLES DRAWN AEROBIC AND ANAEROBIC 10CC   Final   Culture  Setup Time 09/30/2012 17:19   Final   Culture     Final   Value:        BLOOD CULTURE RECEIVED NO GROWTH TO DATE CULTURE WILL BE HELD FOR 5 DAYS BEFORE ISSUING A FINAL NEGATIVE REPORT   Report Status PENDING   Incomplete     Studies:  Recent x-ray studies have been reviewed in detail by the Attending Physician  Scheduled Meds:  Scheduled Meds: . antiseptic oral rinse  15 mL Mouth Rinse QID  . aspirin  300 mg Rectal Daily  . ceFEPime (MAXIPIME) IV  2 g Intravenous Q8H  . chlorhexidine  15 mL Mouth Rinse BID  . furosemide  80 mg Intravenous Q12H  . metoprolol  5 mg Intravenous Q6H  . simvastatin  20 mg Oral q1800  . vancomycin  1,500 mg Intravenous Q12H   Continuous Infusions: . sodium chloride 10 mL/hr at 10/03/12 0500  . heparin 3,000 Units/hr (10/03/12 0500)    Time spent on care of this patient: 35 min   [facility]  [facility]  [phone] Pager - Text Page per Loretha Stapler as per below:  On-Call/Text Page:      [email or web address]      password [email or web address]  If 7PM-7AM, please contact night-coverage [email or web address] Password [email or web address] 10/03/2012, 5:13 PM   LOS: 6 days

## 2012-10-03 NOTE — Progress Notes (Signed)
ANTICOAGULATION CONSULT NOTE - Follow Up Consult  Pharmacy Consult for Heparin Indication: STEMI s/p ventral hernia repair  Allergies  Allergen Reactions  . Aspirin     REACTION: Nausea (325mg )    Patient Measurements: Height: 5\' 7"  (170.2 cm) Weight: 304 lb 3.8 oz (138 kg) IBW/kg (Calculated) : 61.6 Heparin Dosing Weight: 95.7 kg  Vital Signs: Temp: 97.3 F (36.3 C) (03/16 0800) Temp src: Oral (03/16 0800) BP: 103/71 mmHg (03/16 0800) Pulse Rate: 71 (03/16 0800)  Labs:  Recent Labs  10/01/12 0405  10/01/12 2209 10/02/12 0505 10/02/12 1201 10/03/12 0435  HGB 11.8*  --   --   --  11.9* 11.3*  HCT 36.1  --   --   --  36.7 34.5*  PLT 265  --   --   --  303 286  HEPARINUNFRC 0.19*  < > 0.39 0.45  --  0.52  CREATININE 0.95  --  0.99 0.97  --   --   < > = values in this interval not displayed.  Estimated Creatinine Clearance: 97.6 ml/min (by C-G formula based on Cr of 0.97).  Medications:  Infusions:  . sodium chloride 10 mL/hr at 10/03/12 0500  . heparin 3,000 Units/hr (10/03/12 0500)    Assessment: 52 yo female on heparin gtt for STEMI s/p hernia repair on 3/10. Patient was on Coumadin PTA for history of PE/DVT (last dose on 3/4 and home dose was 10mg  daily). Heparin level has remained therapeutic- today was 0.52. CBC is stable and no bleeding noted.  Goal of Therapy:  Heparin level 0.3-0.7 units/ml Monitor platelets by anticoagulation protocol: Yes   Plan:  1. Continue heparin drip at 3000 units/hr 2. Daily heparin level and CBC while on heparin 3. Monitor for signs/symptoms of bleeding  Steffany Schoenfelder D. Kylah Maresh, PharmD Clinical Pharmacist Pager: [phone] 10/03/2012 10:35 AM

## 2012-10-03 NOTE — Progress Notes (Signed)
SUBJECTIVE:  Breanna Wilkins is a 52 yo female admitted for repiar of an incisional hernia.  She has a hx of morbid obesity, HTN, sleep apnea, pulmonary embolus.   She had a post -op MI.  Echo  Shows reduced LV function with inferior wall hypokinesis.   No complaints of chest pain She is short of breath with standing or moving around.  OBJECTIVE:   Vitals:   Filed Vitals:   10/03/12 0100 10/03/12 0300 10/03/12 0500 10/03/12 0800  BP: 123/70 119/78 112/65 103/71  Pulse: 82 72 78 71  Temp:   97.9 F (36.6 C) 97.3 F (36.3 C)  TempSrc:   Oral Oral  Resp: 16 19 19 20   Height:      Weight:      SpO2: 89% 98% 96% 96%   I&O's:    Intake/Output Summary (Last 24 hours) at 10/03/12 1123 Last data filed at 10/03/12 1100  Gross per 24 hour  Intake   2550 ml  Output   2700 ml  Net   -150 ml   TELEMETRY: Reviewed telemetry pt in NSR:     PHYSICAL EXAM General: Well developed, well nourished, in no acute distress Head: Eyes PERRLA, No xanthomas.   Normal cephalic and atramatic  Lungs:   Crackles at bases. Heart:   HRRR S1 S2 Pulses are 2+ & equal. Abdomen: Bowel sounds are positive, abdomen soft and non-tender without masses Extremities:   No clubbing, cyanosis or edema.  DP +1 Neuro: Alert and oriented X 3. Psych:  Good affect, responds appropriately   LABS: Basic Metabolic Panel:  Recent Labs  16/10/96 0405 10/01/12 2209 10/02/12 0505  NA 138 141 141  K 3.5 3.8 3.6  CL 97 101 102  CO2 28 25 28   GLUCOSE 123* 111* 107*  BUN 25* 34* 33*  CREATININE 0.95 0.99 0.97  CALCIUM 9.2 9.6 9.6  MG 2.3  --   --   PHOS 2.9  --   --    Liver Function Tests: No results found for this basename: AST, ALT, ALKPHOS, BILITOT, PROT, ALBUMIN,  in the last 72 hours No results found for this basename: LIPASE, AMYLASE,  in the last 72 hours CBC:  Recent Labs  10/02/12 1201 10/03/12 0435  WBC 9.7 9.8  HGB 11.9* 11.3*  HCT 36.7 34.5*  MCV 85.0 84.8  PLT 303 286   Cardiac  Enzymes: No results found for this basename: CKTOTAL, CKMB, CKMBINDEX, TROPONINI,  in the last 72 hours BNP: No components found with this basename: POCBNP,  D-Dimer: No results found for this basename: DDIMER,  in the last 72 hours Hemoglobin A1C: No results found for this basename: HGBA1C,  in the last 72 hours Fasting Lipid Panel:  Recent Labs  10/02/12 0505  TRIG 243*   Thyroid Function Tests: No results found for this basename: TSH, T4TOTAL, FREET3, T3FREE, THYROIDAB,  in the last 72 hours Anemia Panel: No results found for this basename: VITAMINB12, FOLATE, FERRITIN, TIBC, IRON, RETICCTPCT,  in the last 72 hours Coag Panel:   Lab Results  Component Value Date   INR 1.07 09/27/2012   INR 2.39* 09/20/2012   INR 2.60 09/13/2012    RADIOLOGY: Chest 2 View  09/20/2012  *RADIOLOGY REPORT*  Clinical Data: Preop for hernia repair  CHEST - 2 VIEW  Comparison: 10/17/2010  Findings: Cardiomediastinal silhouette is stable.  No acute infiltrate or pleural effusion.  No pulmonary edema.  Mild degenerative changes thoracic spine.  IMPRESSION:  No active  disease.  Mild degenerative changes thoracic spine.   Original Report Authenticated By: Natasha Mead, M.D.    Dg Chest Port 1 View  10/02/2012  *RADIOLOGY REPORT*  Clinical Data: Infiltrates  PORTABLE CHEST - 1 VIEW  Comparison: One-view chest 10/01/2012  Findings: Cardiac enlargement is stable.  Mild interstitial edema is present.  Aeration continues to improve.  The lung volumes are low.  Minimal bibasilar atelectasis is evident. The endotracheal tube and NG tube have been removed.  IMPRESSION:  1.  Slight improved aeration. 2.  Persistent cardiomegaly and mild edema. 3.  Interval extubation.   Original Report Authenticated By: Marin Roberts, M.D.    Dg Chest Port 1 View  10/01/2012  *RADIOLOGY REPORT*  Clinical Data: Bilateral pulmonary infiltrates.  PORTABLE CHEST - 1 VIEW  Comparison: 09/30/2012  Findings: Endotracheal tube and NG tube are  in good position. There has been significant improvement in the extensive bilateral pulmonary infiltrates.  No effusions.  Heart size and vascularity are normal.  IMPRESSION: Significant improvement in the bilateral pulmonary infiltrates.   Original Report Authenticated By: Francene Boyers, M.D.    Dg Chest Port 1 View  09/30/2012  *RADIOLOGY REPORT*  Clinical Data: Evaluate endotracheal tube positioning  PORTABLE CHEST - 1 VIEW  Comparison: 09/29/2012; 09/28/2012  Findings: Grossly unchanged large cardiac silhouette and mediastinal contours.  Stable position support apparatus.  No pneumothorax.  Lung volumes remain persistently reduced.  Pulmonary vasculature is indistinct.  Grossly unchanged perihilar and bibasilar heterogeneous / consolidative opacities, left greater than right.  No definite pleural effusion.  Unchanged bones.  IMPRESSION: 1.  Stable positioning of support apparatus.  No pneumothorax. 2.  Grossly unchanged findings of pulmonary edema and bilateral air space opacities, left greater than right, atelectasis versus infiltrate.   Original Report Authenticated By: Tacey Ruiz, MD    Dg Chest Port 1 View  09/29/2012  *RADIOLOGY REPORT*  Clinical Data: Pulmonary edema.  Fever.  PORTABLE CHEST - 1 VIEW  Comparison: 03/11 and 09/27/2012  Findings: Endotracheal tube is in good position.  Tip of the NG tube is below the diaphragm. Slight improvement in the bilateral pulmonary edema.  No effusions.  IMPRESSION: Slight improvement in pulmonary edema.   Original Report Authenticated By: Francene Boyers, M.D.    Dg Chest Port 1 View  09/28/2012  *RADIOLOGY REPORT*  Clinical Data: Evaluate endotracheal tube position.  PORTABLE CHEST - 1 VIEW  Comparison: Chest x-ray of 09/27/2012.  Findings: An endotracheal tube is in place with tip 3.9 cm above the carina. A nasogastric tube is seen extending into the stomach, however, the tip of the nasogastric tube extends below the lower margin of the image.  Lung  volumes are low.  Widespread interstitial and airspace disease throughout all aspects of the lungs bilaterally, with perihilar predominance, concerning for moderate to severe pulmonary edema.  Additional superimposed atelectasis in the left lower lobe.  Possible small left pleural effusion.  Mild cardiomegaly is unchanged. The patient is rotated to the left on today's exam, resulting in distortion of the mediastinal contours and reduced diagnostic sensitivity and specificity for mediastinal pathology.  IMPRESSION: 1.  Support apparatus, as above. 2.  Findings, as above, concerning for congestive heart failure, possibly with small left pleural effusion and associated atelectasis in the left lower lobe.   Original Report Authenticated By: Trudie Reed, M.D.    Dg Chest Port 1 View  09/27/2012  *RADIOLOGY REPORT*  Clinical Data: Respiratory difficulty  PORTABLE CHEST - 1 VIEW  Comparison:  09/20/2012  Findings: Mild cardiomegaly.  Endotracheal tube placed.  Tip is 5.1 cm from the carina.  Central airspace disease and vascular congestion.  Left basilar pulmonary opacity.  Gastric distention. No pneumothorax.  IMPRESSION: Central airspace disease and vascular congestion compatible with pulmonary edema.  Left basilar consolidation likely a combination of edema and volume loss.  Gastric distention.  The patient may benefit from an NG tube.   Original Report Authenticated By: Jolaine Click, M.D.       ASSESSMENT:  1.  Acute periop Lateral wall MI on IV Heparin gtt/ASA/beta blocker 2.  Acute mixed systolic/diastolic CHF on IV diuretics.  Continue current meds.   3.  Reduced LVF on echo 4.  S/p laparoscopic ventral hernia repair with mesh  PLAN:   1.Continue Lasix for diuresis - she still has crackles at bases, renal function stable 2.  Continue IV Lopressor until taking PO and then switch to PO 3. Start ACE I when taking PO  Elyn Aquas., MD  10/03/2012  11:23 AM

## 2012-10-04 LAB — CBC
Hemoglobin: 10.8 g/dL — ABNORMAL LOW (ref 12.0–15.0)
MCV: 82.9 fL (ref 78.0–100.0)
Platelets: 283 10*3/uL (ref 150–400)
RBC: 4.03 MIL/uL (ref 3.87–5.11)

## 2012-10-04 LAB — VANCOMYCIN, TROUGH: Vancomycin Tr: 21.3 ug/mL — ABNORMAL HIGH (ref 10.0–20.0)

## 2012-10-04 LAB — PROCALCITONIN: Procalcitonin: 0.13 ng/mL

## 2012-10-04 LAB — HEPARIN LEVEL (UNFRACTIONATED)
Heparin Unfractionated: 0.82 IU/mL — ABNORMAL HIGH (ref 0.30–0.70)
Heparin Unfractionated: 0.5 IU/mL (ref 0.30–0.70)

## 2012-10-04 LAB — GLUCOSE, CAPILLARY: Glucose-Capillary: 109 mg/dL — ABNORMAL HIGH (ref 70–99)

## 2012-10-04 MED ORDER — HEPARIN (PORCINE) IN NACL 100-0.45 UNIT/ML-% IJ SOLN
2750.0000 [IU]/h | INTRAMUSCULAR | Status: DC
  Administered 2012-10-04 – 2012-10-05 (×6): 2750 [IU]/h via INTRAVENOUS
  Filled 2012-10-04 (×8): qty 250

## 2012-10-04 MED ORDER — VANCOMYCIN HCL 10 G IV SOLR
1250.0000 mg | Freq: Two times a day (BID) | INTRAVENOUS | Status: DC
  Filled 2012-10-04 (×2): qty 1250

## 2012-10-04 MED ORDER — CARVEDILOL 6.25 MG PO TABS
6.2500 mg | ORAL_TABLET | Freq: Two times a day (BID) | ORAL | Status: DC
  Administered 2012-10-04 – 2012-10-07 (×5): 6.25 mg via ORAL
  Filled 2012-10-04 (×10): qty 1

## 2012-10-04 MED ORDER — POLYETHYLENE GLYCOL 3350 17 G PO PACK
17.0000 g | PACK | Freq: Two times a day (BID) | ORAL | Status: AC
  Administered 2012-10-04: 17 g via ORAL
  Filled 2012-10-04 (×2): qty 1

## 2012-10-04 NOTE — Progress Notes (Signed)
Patient received to 3025. Assessed per flowsheet. Denies chest pain or shortness of breath. Abdomin distended with surgical sites intact; no s/s of infection or bleeding. Assisted patient to bedside chair. Call bell and visitors near. Breanna Wilkins

## 2012-10-04 NOTE — Progress Notes (Addendum)
7 Days Post-Op  Subjective: Awake. Alert. Oriented. Pleasant. Mental status normal.  Tolerating full liquid diet. No bowel movement since Friday. Ambulating a little. Notes mild orthopnea and desats a little when supine.  Otherwise he was medically stable, no tachycardia, no chest pain, minimal abdominal incisional pain.  Objective: Vital signs in last 24 hours: Temp:  [97.3 F (36.3 C)-99.1 F (37.3 C)] 98.7 F (37.1 C) (03/17 0459) Pulse Rate:  [69-87] 69 (03/17 0459) Resp:  [16-28] 16 (03/17 0459) BP: (103-134)/(66-90) 134/69 mmHg (03/17 0459) SpO2:  [96 %-100 %] 99 % (03/17 0459) Weight:  [308 lb 10.3 oz (140 kg)] 308 lb 10.3 oz (140 kg) (03/17 0459) Last BM Date: 10/01/12  Intake/Output from previous day: 03/16 0701 - 03/17 0700 In: 1990 [P.O.:470; I.V.:870; IV Piggyback:650] Out: 2000 [Urine:2000] Intake/Output this shift: Total I/O In: 400 [I.V.:400] Out: 700 [Urine:700]    EXAM: General appearance: cooperative. No distress. Heart rate 75. SpO2 100% on 5 L. Resp: clear to auscultation anteriorly. GI: obese. Soft. Wounds looked fine. Doesn't seem distended. Minimal incisional tenderness.  Lab Results:  Results for orders placed during the hospital encounter of 09/27/12 (from the past 24 hour(s))  HEPARIN LEVEL (UNFRACTIONATED)     Status: Abnormal   Collection Time    10/04/12  4:55 AM      Result Value Range   Heparin Unfractionated 0.82 (*) 0.30 - 0.70 IU/mL  CBC     Status: Abnormal   Collection Time    10/04/12  4:55 AM      Result Value Range   WBC 10.0  4.0 - 10.5 K/uL   RBC 4.03  3.87 - 5.11 MIL/uL   Hemoglobin 10.8 (*) 12.0 - 15.0 g/dL   HCT 16.1 (*) 09.6 - 04.5 %   MCV 82.9  78.0 - 100.0 fL   MCH 26.8  26.0 - 34.0 pg   MCHC 32.3  30.0 - 36.0 g/dL   RDW 40.9 (*) 81.1 - 91.4 %   Platelets 283  150 - 400 K/uL  PROCALCITONIN     Status: None   Collection Time    10/04/12  4:55 AM      Result Value Range   Procalcitonin 0.13        Studies/Results: @RISRSLT24 @  . antiseptic oral rinse  15 mL Mouth Rinse QID  . aspirin  300 mg Rectal Daily  . ceFEPime (MAXIPIME) IV  2 g Intravenous Q8H  . chlorhexidine  15 mL Mouth Rinse BID  . furosemide  80 mg Intravenous Q12H  . metoprolol  5 mg Intravenous Q6H  . simvastatin  20 mg Oral q1800  . vancomycin  1,500 mg Intravenous Q12H     Assessment/Plan: s/p Procedure(s): LAPAROSCOPIC INCISIONAL HERNIA possible open INSERTION OF MESH  POD #7. Laparoscopic ventral hernia repair with mesh. No obvious surgical complications.  Hgb. Stable. Advance diet Ambulated [facility]   Acute perioperative lateral STEMI., without hemodynamic compromise.Associated acute mixed systolic/diastolic CHF Arm twice a day Lasix IV.   No chest pain.  Remains on heparin drip. Await decisions about cardiac cath. Eventually restart Coumadin.  Pulmonary edema, presumably cardiogenic.Marland Kitchen Receiving Lasix and fluid restriction. .   Question HCAP?? - On antibiotics currently per CCM recommendation.  Nutrition. Advance to low-fat, heart healthy diet  History pulmonary embolism, on chronic Coumadin As an outpatient.   History of sleep apnea   History hypertension-On Lopressor   On PPI for ulcer prophylaxis .  Morbid obesity      @  PROBHOSP@  LOS: 7 days    Breanna Wilkins 10/04/2012  . .prob

## 2012-10-04 NOTE — Progress Notes (Signed)
Physical Therapy Evaluation Patient Details Name: Breanna Wilkins MRN: [id number] DOB: Dec 30, 1960 Today's Date: 10/04/2012 Time: 7829-5621 PT Time Calculation (min): 29 min  PT Assessment / Plan / Recommendation Clinical Impression  pt admitted for ventral hernia repair and suffered an STEMI post op.  Now scheduled for CATH 3/19.  Pt can benefit from PT to maximize function and Independence    PT Assessment  Patient needs continued PT services    Follow Up Recommendations  Home health PT;Other (comment) (up to 24 hour assist initially)    Does the patient have the potential to tolerate intense rehabilitation      Barriers to Discharge        Equipment Recommendations  None recommended by PT    Recommendations for Other Services     Frequency Min 3X/week    Precautions / Restrictions Precautions Precautions: Fall Restrictions Weight Bearing Restrictions: No   Pertinent Vitals/Pain       Mobility  Bed Mobility Bed Mobility: Not assessed Transfers Transfers: Sit to Stand;Stand to Sit Sit to Stand: 4: Min guard;With upper extremity assist;From chair/3-in-1 Stand to Sit: 5: Supervision;With upper extremity assist;To bed Details for Transfer Assistance: safe if slow Ambulation/Gait Ambulation/Gait Assistance: 4: Min assist Ambulation Distance (Feet): 90 Feet Assistive device: Other (Comment);1 person hand held assist (IV pole) Ambulation/Gait Assistance Details: generally steady gait.  Mildly labored with minor dyspnea. Gait Pattern: Step-through pattern Gait velocity: slowed Stairs: No    Exercises     PT Diagnosis: Difficulty walking;Other (comment) (decr activity tolerance)  PT Problem List: Decreased activity tolerance;Decreased balance;Decreased knowledge of use of DME;Cardiopulmonary status limiting activity PT Treatment Interventions: DME instruction;Gait training;Stair training;Functional mobility training;Therapeutic activities;Patient/family  education;Balance training   PT Goals Acute Rehab PT Goals PT Goal Formulation: With patient Time For Goal Achievement: 10/18/12 Potential to Achieve Goals: Good Pt will go Supine/Side to Sit: Independently PT Goal: Supine/Side to Sit - Progress: Goal set today Pt will go Sit to Supine/Side: Independently PT Goal: Sit to Supine/Side - Progress: Goal set today Pt will go Sit to Stand: with modified independence PT Goal: Sit to Stand - Progress: Goal set today Pt will Transfer Bed to Chair/Chair to Bed: with modified independence PT Transfer Goal: Bed to Chair/Chair to Bed - Progress: Goal set today Pt will Ambulate: >150 feet;with supervision;with least restrictive assistive device PT Goal: Ambulate - Progress: Goal set today Pt will Go Up / Down Stairs: 3-5 stairs;with supervision;with rail(s) PT Goal: Up/Down Stairs - Progress: Goal set today  Visit Information  Last PT Received On: 10/04/12 Assistance Needed: +1    Subjective Data  Subjective: I admit I'm pretty scared.  I think I might need more help at home. Patient Stated Goal: I want to get better and be able to care for myself   Prior Functioning  Home Living Lives With: Other (Comment) (room mate) Available Help at Discharge: Other (Comment) (room mate,  friends) Type of Home: Mobile home Home Access: Stairs to enter Entrance Stairs-Number of Steps: 2 Entrance Stairs-Rails: Right;Left Home Layout: One level;Other (Comment) (step up and down within the trailer.) Bathroom Shower/Tub: Tub/shower unit;[employer]: Standard Home Adaptive Equipment: Straight cane;Bedside commode/3-in-1 Prior Function Level of Independence: Independent with assistive device(s);Independent Able to Take Stairs?: Yes Communication Communication: No difficulties    Cognition  Cognition Overall Cognitive Status: Appears within functional limits for tasks assessed/performed Arousal/Alertness: Awake/alert Orientation Level:  Appears intact for tasks assessed Behavior During Session: [facility] for tasks performed    Extremity/Trunk  Assessment Right Lower Extremity Assessment RLE ROM/Strength/Tone: Within functional levels Left Lower Extremity Assessment LLE ROM/Strength/Tone: Within functional levels Trunk Assessment Trunk Assessment: Normal   Balance    End of Session PT - End of Session Activity Tolerance: Patient tolerated treatment well;Patient limited by fatigue Patient left: Other (comment) (sitting EOB) Nurse Communication: Mobility status  GP     Makiah Clauson, Eliseo Gum 10/04/2012, 5:19 PM 10/04/2012  Wyano Bing, PT [phone] (pager)

## 2012-10-04 NOTE — Progress Notes (Signed)
ANTICOAGULATION CONSULT NOTE - Follow Up Consult  Pharmacy Consult for Heparin Indication: STEMI s/p ventral hernia repair; hx DVT/PE  Allergies  Allergen Reactions  . Aspirin     REACTION: Nausea (325mg )    Patient Measurements: Height: 5\' 7"  (170.2 cm) Weight: 308 lb 10.3 oz (140 kg) IBW/kg (Calculated) : 61.6 Heparin Dosing Weight: 96kg  Vital Signs: Temp: 98.3 F (36.8 C) (03/17 0748) Temp src: Axillary (03/17 0748) BP: 126/71 mmHg (03/17 0748) Pulse Rate: 76 (03/17 0748)  Labs:  Recent Labs  10/01/12 2209 10/02/12 0505  10/02/12 1201 10/03/12 0435 10/04/12 0455 10/04/12 0751  HGB  --   --   < > 11.9* 11.3* 10.8*  --   HCT  --   --   --  36.7 34.5* 33.4*  --   PLT  --   --   --  303 286 283  --   HEPARINUNFRC 0.39 0.45  --   --  0.52 0.82* 0.96*  CREATININE 0.99 0.97  --   --   --   --   --   < > = values in this interval not displayed.  Estimated Creatinine Clearance: 98.5 ml/min (by C-G formula based on Cr of 0.97).   Medications:  Heparin 3000 units/hr  Assessment: 1. 51yof with STEMI s/p ventral hernia repair on heparin for hx PE/DVT. Initial heparin level and redraw (from opposite arm) were both supratherapeutic this AM (0.82, 0.96). Patient has been fairly stable on previous heparin rate - will hold heparin drip ~30 minutes and restart at lower rate. Noted plans for possible cath on Wednesday. - H/H trending down, Plts stable - No significant bleeding reported per RN  2. Patient is also on Vancomycin and Cefepime Day 4 for possible HCAP. Patient is afebrile, WBC wnl and cultures have remained no growth. CXR (3/15) showed improved aeration and no definite infiltrate. Antibiotics not required from pulmonary standpoint (per CCM note) or from surgical standpoint (discussed with Dr. Derrell Lolling). Discussed antibiotic plan with [facility] MD Sharon Seller - he will make discontinuation decision once evaluates patient later in day. Vancomycin trough (21.3) was just above goal  range - will reduce dose and follow-up antibiotic plan.  Goal of Therapy:  Heparin level 0.3-0.7 units/ml Vancomycin trough: 15-20 mcg/ml Monitor platelets by anticoagulation protocol: Yes   Plan:  1. Hold heparin drip ~30 minutes and then restart heparin drip 2750 units/hr (27.5 ml/hr) @ 1000 2. Check heparin level 6 hours after restart (~1600) 3. Change Vancomycin to 1250mg  IV q12h. Continue Cefepime 2g IV q8h - follow-up antibiotic plan (most likely will be discontinued)   Cleon Dew [phone] 10/04/2012,9:32 AM

## 2012-10-04 NOTE — Progress Notes (Signed)
ANTICOAGULATION CONSULT NOTE - Follow Up Consult  Pharmacy Consult for Heparin Indication: STEMI s/p ventral hernia repair; hx DVT/PE  Allergies  Allergen Reactions  . Aspirin     REACTION: Nausea (325mg )    Patient Measurements: Height: 5\' 7"  (170.2 cm) Weight: 308 lb 10.3 oz (140 kg) IBW/kg (Calculated) : 61.6 Heparin Dosing Weight: 96kg  Vital Signs: Temp: 98.3 F (36.8 C) (03/17 1550) Temp src: Oral (03/17 1550) BP: 131/70 mmHg (03/17 1550) Pulse Rate: 93 (03/17 1550)  Labs:  Recent Labs  10/01/12 2209 10/02/12 0505  10/02/12 1201 10/03/12 0435 10/04/12 0455 10/04/12 0751 10/04/12 1527  HGB  --   --   < > 11.9* 11.3* 10.8*  --   --   HCT  --   --   --  36.7 34.5* 33.4*  --   --   PLT  --   --   --  303 286 283  --   --   HEPARINUNFRC 0.39 0.45  --   --  0.52 0.82* 0.96* 0.50  CREATININE 0.99 0.97  --   --   --   --   --   --   < > = values in this interval not displayed.  Estimated Creatinine Clearance: 98.5 ml/min (by C-G formula based on Cr of 0.97).   Medications:  Heparin 2750 units/hr  Assessment: 51yof with STEMI s/p ventral hernia repair on heparin for hx PE/DVT. Heparin level at goal.  Goal of Therapy:  Heparin level 0.3-0.7 units/ml Monitor platelets by anticoagulation protocol: Yes   Plan:  Continue gtt at 2750 units/hr and f/u in am.  Verlene Mayer, PharmD, BCPS Pager [phone] 10/04/2012,4:05 PM

## 2012-10-04 NOTE — Progress Notes (Signed)
Patient ID: Breanna Wilkins, female   DOB: 06/14/1961, 52 y.o.   MRN: [id number] SUBJECTIVE:  Ms Strough is a 52 yo female admitted for repiar of an incisional hernia.  She has a hx of morbid obesity, HTN, sleep apnea, pulmonary embolus.   She had a post -op MI.  Echo  Shows reduced LV function with inferior wall hypokinesis.   No complaints of chest pain She is short of breath with standing or moving around. Sats drop to 55% with washing up and improve quickly resting at side of bed  OBJECTIVE:   Vitals:   Filed Vitals:   10/03/12 2000 10/03/12 2117 10/03/12 2349 10/04/12 0459  BP: 119/78  127/73 134/69  Pulse: 87 83 77 69  Temp: 98.4 F (36.9 C)  99.1 F (37.3 C) 98.7 F (37.1 C)  TempSrc: Oral  Oral Oral  Resp: 19 28 18 16   Height:      Weight:    308 lb 10.3 oz (140 kg)  SpO2: 100% 99% 99% 99%   I&O's:    Intake/Output Summary (Last 24 hours) at 10/04/12 0743 Last data filed at 10/04/12 0500  Gross per 24 hour  Intake   1990 ml  Output   2000 ml  Net    -10 ml   TELEMETRY: Reviewed telemetry pt in NSR: 10/04/2012      PHYSICAL EXAM General: Morbidly obese black female Head: Eyes PERRLA, No xanthomas.   Normal cephalic and atramatic  Lungs:   Crackles at bases. Heart:   HRRR S1 S2 Pulses are 2+ & equal. Abdomen: Bowel sounds are positive, abdomen soft and non-tender without masses Extremities:   No clubbing, cyanosis or edema.  DP +1 Neuro: Alert and oriented X 3. Psych:  Good affect, responds appropriately S/P right inguinal hernia repair   LABS: Basic Metabolic Panel:  Recent Labs  56/21/30 2209 10/02/12 0505  NA 141 141  K 3.8 3.6  CL 101 102  CO2 25 28  GLUCOSE 111* 107*  BUN 34* 33*  CREATININE 0.99 0.97  CALCIUM 9.6 9.6   CBC:  Recent Labs  10/03/12 0435 10/04/12 0455  WBC 9.8 10.0  HGB 11.3* 10.8*  HCT 34.5* 33.4*  MCV 84.8 82.9  PLT 286 283   Fasting Lipid Panel:  Recent Labs  10/02/12 0505  TRIG 243*   Coag Panel:   Lab  Results  Component Value Date   INR 1.07 09/27/2012   INR 2.39* 09/20/2012   INR 2.60 09/13/2012    RADIOLOGY: Chest 2 View  09/20/2012  *RADIOLOGY REPORT*  Clinical Data: Preop for hernia repair  CHEST - 2 VIEW  Comparison: 10/17/2010  Findings: Cardiomediastinal silhouette is stable.  No acute infiltrate or pleural effusion.  No pulmonary edema.  Mild degenerative changes thoracic spine.  IMPRESSION:  No active disease.  Mild degenerative changes thoracic spine.   Original Report Authenticated By: Natasha Mead, M.D.    Dg Chest Port 1 View  10/02/2012  *RADIOLOGY REPORT*  Clinical Data: Infiltrates  PORTABLE CHEST - 1 VIEW  Comparison: One-view chest 10/01/2012  Findings: Cardiac enlargement is stable.  Mild interstitial edema is present.  Aeration continues to improve.  The lung volumes are low.  Minimal bibasilar atelectasis is evident. The endotracheal tube and NG tube have been removed.  IMPRESSION:  1.  Slight improved aeration. 2.  Persistent cardiomegaly and mild edema. 3.  Interval extubation.   Original Report Authenticated By: Marin Roberts, M.D.    Dg Chest [facility]  1 View  10/01/2012  *RADIOLOGY REPORT*  Clinical Data: Bilateral pulmonary infiltrates.  PORTABLE CHEST - 1 VIEW  Comparison: 09/30/2012  Findings: Endotracheal tube and NG tube are in good position. There has been significant improvement in the extensive bilateral pulmonary infiltrates.  No effusions.  Heart size and vascularity are normal.  IMPRESSION: Significant improvement in the bilateral pulmonary infiltrates.   Original Report Authenticated By: Francene Boyers, M.D.    Dg Chest Port 1 View  09/30/2012  *RADIOLOGY REPORT*  Clinical Data: Evaluate endotracheal tube positioning  PORTABLE CHEST - 1 VIEW  Comparison: 09/29/2012; 09/28/2012  Findings: Grossly unchanged large cardiac silhouette and mediastinal contours.  Stable position support apparatus.  No pneumothorax.  Lung volumes remain persistently reduced.  Pulmonary  vasculature is indistinct.  Grossly unchanged perihilar and bibasilar heterogeneous / consolidative opacities, left greater than right.  No definite pleural effusion.  Unchanged bones.  IMPRESSION: 1.  Stable positioning of support apparatus.  No pneumothorax. 2.  Grossly unchanged findings of pulmonary edema and bilateral air space opacities, left greater than right, atelectasis versus infiltrate.   Original Report Authenticated By: Tacey Ruiz, MD    Dg Chest Port 1 View  09/29/2012  *RADIOLOGY REPORT*  Clinical Data: Pulmonary edema.  Fever.  PORTABLE CHEST - 1 VIEW  Comparison: 03/11 and 09/27/2012  Findings: Endotracheal tube is in good position.  Tip of the NG tube is below the diaphragm. Slight improvement in the bilateral pulmonary edema.  No effusions.  IMPRESSION: Slight improvement in pulmonary edema.   Original Report Authenticated By: Francene Boyers, M.D.    Dg Chest Port 1 View  09/28/2012  *RADIOLOGY REPORT*  Clinical Data: Evaluate endotracheal tube position.  PORTABLE CHEST - 1 VIEW  Comparison: Chest x-ray of 09/27/2012.  Findings: An endotracheal tube is in place with tip 3.9 cm above the carina. A nasogastric tube is seen extending into the stomach, however, the tip of the nasogastric tube extends below the lower margin of the image.  Lung volumes are low.  Widespread interstitial and airspace disease throughout all aspects of the lungs bilaterally, with perihilar predominance, concerning for moderate to severe pulmonary edema.  Additional superimposed atelectasis in the left lower lobe.  Possible small left pleural effusion.  Mild cardiomegaly is unchanged. The patient is rotated to the left on today's exam, resulting in distortion of the mediastinal contours and reduced diagnostic sensitivity and specificity for mediastinal pathology.  IMPRESSION: 1.  Support apparatus, as above. 2.  Findings, as above, concerning for congestive heart failure, possibly with small left pleural effusion and  associated atelectasis in the left lower lobe.   Original Report Authenticated By: Trudie Reed, M.D.    Dg Chest Port 1 View  09/27/2012  *RADIOLOGY REPORT*  Clinical Data: Respiratory difficulty  PORTABLE CHEST - 1 VIEW  Comparison: 09/20/2012  Findings: Mild cardiomegaly.  Endotracheal tube placed.  Tip is 5.1 cm from the carina.  Central airspace disease and vascular congestion.  Left basilar pulmonary opacity.  Gastric distention. No pneumothorax.  IMPRESSION: Central airspace disease and vascular congestion compatible with pulmonary edema.  Left basilar consolidation likely a combination of edema and volume loss.  Gastric distention.  The patient may benefit from an NG tube.   Original Report Authenticated By: Jolaine Click, M.D.       ASSESSMENT:  1.  Acute periop Lateral wall MI on IV Heparin gtt/ASA/beta blocker Will tentatively schedule cath for [place] preferably radially 2.  Acute mixed systolic/diastolic CHF on IV diuretics.  Continue current meds.   3.  Reduced LVF on echo 4.  S/p laparoscopic ventral hernia repair with mesh  PLAN:   1.Continue Lasix for diuresis - she still has crackles at bases, renal function stable 2.  Oral beta blocker    Charlton Haws, MD  10/04/2012  7:43 AM

## 2012-10-04 NOTE — Progress Notes (Signed)
Report called to [name] RN. Pt to transfer to 3W-25 via wheelchair, IV, O2. Meds in chart, belongings at bedside, Family/ Friends at bedside. VS stable at time of transfer. No currrent questions or complaints at this time. [name] L

## 2012-10-04 NOTE — Progress Notes (Signed)
Patient to bathroom. Assisted to bed. Partial bath complete with gown changed. Assisted back to bed. Medications given. Call bell and visitor near. Breanna Wilkins

## 2012-10-04 NOTE — Consult Note (Signed)
[facility] Progress Note [place] TEAM 1 - Stepdown/ICU TEAM   Breanna Wilkins AVW:[id number] DOB: 1961/01/25 DOA: 09/27/2012 PCP: Carrolyn Meiers, MD  Brief narrative: 52 year old female who underwent a ventral hernia repair on 3/10. Postop course was complicated by respiratory distress requiring reintubation. Cardiac enzymes were noted to be elevated and she was diagnosed with a perioperative MI with resultant pulmonary edema. A Cardiology consult was requested and a conservative approach was taken with initiation of diuretics, beta blockers, IV heparin and aspirin. An echo revealed severe hypokinesis of the inferior segment and the inferolateral segment of the mid LV along with hypokinesis of the apical aspect of the RV free wall. Moderate LVH was noted as well. She was extubated 3/15, and her consultative care was transferred to the [facility].  In that her medical issues are the reason for her ongoing hospital stay at this time, [facility] will assume the attending role as of 3/17, with Gen Surgery and Cardiology continuing to follow as consults.     Assessment/Plan:  VDRF: (A) Pulmonary edema in setting of STEMI (B) HCAP Resolved, i.e. extubated and doing well  ?HCAP Followup x-rays not convincing of a true infectious infiltrate - the patient is afebrile and her white count has normalized -  I will discontinue antibiotics and we will follow clinically  Acute peri-operative lateral wall MI Cards is planning for cardiac cath 3/19 - med tx for now  Acute mixed systolic and diastolic CHF Diuresis per Cardiology - agree that patient remains volume overloaded at present - resume ACE when BP improves and when patient is post cath/dye load  h/o DVT and PE - warfarin anticoagulation as outpatient Currently receiving Heparin   Ventral hernia with obstruction - S/p laparoscopic ventral hernia repair with mesh Care as per Gen Surgery - now advancing diet  Morbid  obestiy  Obstructive sleep apnea Continue nightly CPAP  History of HTN Currently well-controlled - follow trend  Code Status: full code Family Communication: discussed with patient Disposition Plan: Transfer to cardiac telemetry bed  Consultants: Cardiology Surgery  Procedures: 3/10 Ventral Hernia repair 3/10 - 3/15 intubated >> extubated  Antibiotics: Ancef 3/10- 3/11 Cefepime 3/13 >> 3/17 Vancomycin 3/13>> 3/17  DVT prophylaxis: IV Heparin  HPI/Subjective: Patient is alert oriented and quite pleasant.  She does complain of shortness of breath with physical exertion.  She denies chest pain.  She denies fevers chills abdominal pain.  She reports her appetite is returning.  Objective: Blood pressure 102/63, pulse 83, temperature 99.2 F (37.3 C), temperature source Axillary, resp. rate 23, height 5\' 7"  (1.702 m), weight 140 kg (308 lb 10.3 oz), SpO2 100.00%.  Intake/Output Summary (Last 24 hours) at 10/04/12 1355 Last data filed at 10/04/12 1300  Gross per 24 hour  Intake   2090 ml  Output   2700 ml  Net   -610 ml    Exam: General: No acute respiratory distress at rest Lungs: Diffuse crackles worse in bases with no wheeze Cardiovascular: Regular rate and rhythm without murmur gallop or rub normal S1 and S2 Abdomen: Obese, nondistended, soft, bowel sounds positive, no rebound, no ascites, no appreciable mass Extremities: No significant cyanosis or clubbing, 2+ bilateral lower extremity edema  Data Reviewed: Basic Metabolic Panel:  Recent Labs Lab 09/27/12 1704 09/28/12 0445  09/29/12 0720 09/30/12 0345 10/01/12 0405 10/01/12 2209 10/02/12 0505  NA 138 137  < > 139 138 138 141 141  K 3.8 3.8  < > 3.0* 3.4* 3.5 3.8 3.6  CL 101 101  < > 98 98 97 101 102  CO2 27 27  < > 29 27 28 25 28   GLUCOSE 166* 136*  < > 111* 117* 123* 111* 107*  BUN 13 12  < > 10 12 25* 34* 33*  CREATININE 0.74 0.80  < > 0.87 0.91 0.95 0.99 0.97  CALCIUM 9.0 8.5  < > 8.8 8.8 9.2 9.6  9.6  MG 2.0 1.6  --  2.0  --  2.3  --   --   PHOS 4.4 2.8  --  1.8* 2.1* 2.9  --   --   < > = values in this interval not displayed. Liver Function Tests:  Recent Labs Lab 09/30/12 0345  AST 19  ALT 6  ALKPHOS 71  BILITOT 0.9  PROT 7.6  ALBUMIN 2.6*   CBC:  Recent Labs Lab 09/27/12 1704  09/30/12 0345 10/01/12 0405 10/02/12 1201 10/03/12 0435 10/04/12 0455  WBC 8.4  < > 12.4* 10.9* 9.7 9.8 10.0  NEUTROABS 7.3  --   --   --   --   --   --   HGB 14.0  < > 12.0 11.8* 11.9* 11.3* 10.8*  HCT 43.1  < > 37.2 36.1 36.7 34.5* 33.4*  MCV 86.2  < > 84.2 84.1 85.0 84.8 82.9  PLT 274  < > 239 265 303 286 283  < > = values in this interval not displayed. Cardiac Enzymes:  Recent Labs Lab 09/27/12 1727 09/27/12 2228 09/28/12 0445  TROPONINI 3.73* 4.56* 2.92*   BNP (last 3 results)  Recent Labs  01/20/12 1621 09/30/12 0345 10/01/12 0405  PROBNP 25.9 2211.0* 964.6*   CBG:  Recent Labs Lab 09/27/12 1850 10/01/12 1505  GLUCAP 112* 104*    Recent Results (from the past 240 hour(s))  URINE CULTURE     Status: None   Collection Time    09/27/12  6:59 PM      Result Value Range Status   Specimen Description URINE, CATHETERIZED   Final   Special Requests NONE   Final   Culture  Setup Time 09/28/2012 03:06   Final   Colony Count NO GROWTH   Final   Culture NO GROWTH   Final   Report Status 09/28/2012 FINAL   Final  MRSA PCR SCREENING     Status: None   Collection Time    09/27/12  7:03 PM      Result Value Range Status   MRSA by PCR NEGATIVE  NEGATIVE Final   Comment:            The GeneXpert MRSA Assay (FDA     approved for NASAL specimens     only), is one component of a     comprehensive MRSA colonization     surveillance program. It is not     intended to diagnose MRSA     infection nor to guide or     monitor treatment for     MRSA infections.  URINE CULTURE     Status: None   Collection Time    09/30/12  6:42 AM      Result Value Range Status    Specimen Description URINE, CATHETERIZED   Final   Special Requests Normal   Final   Culture  Setup Time 09/30/2012 07:06   Final   Colony Count NO GROWTH   Final   Culture NO GROWTH   Final   Report Status 10/01/2012 FINAL  Final  CULTURE, BLOOD (ROUTINE X 2)     Status: None   Collection Time    09/30/12 10:46 AM      Result Value Range Status   Specimen Description BLOOD ARM RIGHT   Final   Special Requests BOTTLES DRAWN AEROBIC AND ANAEROBIC 10CC   Final   Culture  Setup Time 09/30/2012 17:19   Final   Culture     Final   Value:        BLOOD CULTURE RECEIVED NO GROWTH TO DATE CULTURE WILL BE HELD FOR 5 DAYS BEFORE ISSUING A FINAL NEGATIVE REPORT   Report Status PENDING   Incomplete  CULTURE, BLOOD (ROUTINE X 2)     Status: None   Collection Time    09/30/12 10:50 AM      Result Value Range Status   Specimen Description BLOOD RIGHT ANTECUBITAL   Final   Special Requests BOTTLES DRAWN AEROBIC AND ANAEROBIC 10CC   Final   Culture  Setup Time 09/30/2012 17:19   Final   Culture     Final   Value:        BLOOD CULTURE RECEIVED NO GROWTH TO DATE CULTURE WILL BE HELD FOR 5 DAYS BEFORE ISSUING A FINAL NEGATIVE REPORT   Report Status PENDING   Incomplete     Studies:  Recent x-ray studies have been reviewed in detail by the Attending Physician  Scheduled Meds:  Scheduled Meds: . antiseptic oral rinse  15 mL Mouth Rinse QID  . aspirin  300 mg Rectal Daily  . carvedilol  6.25 mg Oral BID WC  . ceFEPime (MAXIPIME) IV  2 g Intravenous Q8H  . chlorhexidine  15 mL Mouth Rinse BID  . furosemide  80 mg Intravenous Q12H  . polyethylene glycol  17 g Oral BID  . simvastatin  20 mg Oral q1800  . vancomycin  1,250 mg Intravenous Q12H   Continuous Infusions: . sodium chloride 10 mL/hr at 10/03/12 0500  . heparin 2,750 Units/hr (10/04/12 1031)    Time spent on care of this patient: 35 min   [facility]  [facility] Office  [phone] Pager - Text Page per Loretha Stapler as  per below:  On-Call/Text Page:      [email or web address]      password [email or web address]  If 7PM-7AM, please contact night-coverage [email or web address] Password [email or web address] 10/04/2012, 1:55 PM   LOS: 7 days

## 2012-10-05 DIAGNOSIS — I10 Essential (primary) hypertension: Secondary | ICD-10-CM

## 2012-10-05 DIAGNOSIS — I5041 Acute combined systolic (congestive) and diastolic (congestive) heart failure: Secondary | ICD-10-CM

## 2012-10-05 DIAGNOSIS — I509 Heart failure, unspecified: Secondary | ICD-10-CM

## 2012-10-05 DIAGNOSIS — I214 Non-ST elevation (NSTEMI) myocardial infarction: Secondary | ICD-10-CM

## 2012-10-05 LAB — BASIC METABOLIC PANEL
BUN: 22 mg/dL (ref 6–23)
Chloride: 98 mEq/L (ref 96–112)
GFR calc Af Amer: 72 mL/min — ABNORMAL LOW (ref >90)
Potassium: 3.3 mEq/L — ABNORMAL LOW (ref 3.5–5.1)
Sodium: 136 mEq/L (ref 135–145)

## 2012-10-05 LAB — CBC
HCT: 33 % — ABNORMAL LOW (ref 36.0–46.0)
Hemoglobin: 10.7 g/dL — ABNORMAL LOW (ref 12.0–15.0)
RDW: 15.6 % — ABNORMAL HIGH (ref 11.5–15.5)
WBC: 10.2 10*3/uL (ref 4.0–10.5)

## 2012-10-05 LAB — HEPARIN LEVEL (UNFRACTIONATED): Heparin Unfractionated: 0.62 IU/mL (ref 0.30–0.70)

## 2012-10-05 MED ORDER — CEPASTAT 14.5 MG MT LOZG
1.0000 | LOZENGE | OROMUCOSAL | Status: DC | PRN
  Filled 2012-10-05 (×2): qty 18

## 2012-10-05 MED ORDER — POTASSIUM CHLORIDE CRYS ER 20 MEQ PO TBCR
40.0000 meq | EXTENDED_RELEASE_TABLET | Freq: Once | ORAL | Status: AC
  Administered 2012-10-05: 40 meq via ORAL
  Filled 2012-10-05: qty 2

## 2012-10-05 MED ORDER — SODIUM CHLORIDE 0.9 % IJ SOLN: 3.0000 mL | Freq: Two times a day (BID) | INTRAMUSCULAR | Status: DC

## 2012-10-05 MED ORDER — ASPIRIN 325 MG PO TABS
325.0000 mg | ORAL_TABLET | Freq: Every day | ORAL | Status: DC
  Administered 2012-10-05 – 2012-10-07 (×3): 325 mg via ORAL
  Filled 2012-10-05 (×3): qty 1

## 2012-10-05 MED ORDER — SODIUM CHLORIDE 0.9 % IJ SOLN: 3.0000 mL | INTRAMUSCULAR | Status: DC | PRN

## 2012-10-05 NOTE — Consult Note (Signed)
[facility] Progress Note   Breanna Wilkins ZOX:[id number] DOB: 03/16/1961 DOA: 09/27/2012 PCP: Carrolyn Meiers, MD  Brief narrative: 52 year old female who underwent a ventral hernia repair on 3/10. Postop course was complicated by respiratory distress requiring reintubation. Cardiac enzymes were noted to be elevated and she was diagnosed with a perioperative MI with resultant pulmonary edema. A Cardiology consult was requested and a conservative approach was taken with initiation of diuretics, beta blockers, IV heparin and aspirin. An echo revealed severe hypokinesis of the inferior segment and the inferolateral segment of the mid LV along with hypokinesis of the apical aspect of the RV free wall. Moderate LVH was noted as well. She was extubated 3/15, and her consultative care was transferred to the [facility].  Assessment/Plan:  VDRF: (A) Pulmonary edema in setting of STEMI (B) HCAP Resolved, i.e. extubated and doing well  ?HCAP Followup x-rays not convincing of a true infectious infiltrate - the patient is afebrile and her white count has normalized. Antibiotics were discontinued on 3/17. We will follow clinically  Acute peri-operative lateral wall MI Cards is planning for cardiac cath 3/19 - med tx for now. Change to oral aspirin.  Acute mixed systolic and diastolic CHF Diuresis per Cardiology - agree that patient remains volume overloaded at present - resume ACE when BP improves and when patient is post cath/dye load. Check RA sats.  h/o DVT and PE - warfarin anticoagulation as outpatient Currently receiving Heparin   Ventral hernia with obstruction - S/p laparoscopic ventral hernia repair with mesh Care as per Gen Surgery - now advancing diet  Morbid obestiy Stable. Will need weight loss counseling.  Obstructive sleep apnea Continue nightly CPAP  History of HTN Currently well-controlled - follow trend  Code Status: full code Family Communication:  discussed with patient Disposition Plan: PT/OT recommends HH  Consultants: [facility] Surgery  Procedures: 3/10 Ventral Hernia repair 3/10 - 3/15 intubated >> extubated  Antibiotics: Ancef 3/10- 3/11 Cefepime 3/13 >> 3/17 Vancomycin 3/13>> 3/17  DVT prophylaxis: IV Heparin  HPI/Subjective: Patient feels weel. Admits to some shortness of breath once in a while. Cough with white sputum. Ambulated today. She denies chest pain.   Objective: Blood pressure 129/64, pulse 81, temperature 99.6 F (37.6 C), temperature source Oral, resp. rate 20, height 5\' 7"  (1.702 m), weight 134.31 kg (296 lb 1.6 oz), SpO2 98.00%.  Intake/Output Summary (Last 24 hours) at 10/05/12 1248 Last data filed at 10/05/12 0500  Gross per 24 hour  Intake    390 ml  Output    850 ml  Net   -460 ml    Exam: General: No acute respiratory distress at rest Lungs: Crackles at the bases without wheezing.  Cardiovascular: Regular rate and rhythm without murmur gallop or rub normal S1 and S2 Abdomen: Obese, nondistended, soft, bowel sounds positive, no rebound, no ascites, no appreciable mass Extremities: No significant cyanosis or clubbing, Improving bilateral lower extremity edema. Now about 1+  Data Reviewed: Basic Metabolic Panel:  Recent Labs Lab 09/29/12 0720 09/30/12 0345 10/01/12 0405 10/01/12 2209 10/02/12 0505 10/05/12 0555  NA 139 138 138 141 141 136  K 3.0* 3.4* 3.5 3.8 3.6 3.3*  CL 98 98 97 101 102 98  CO2 29 27 28 25 28 27   GLUCOSE 111* 117* 123* 111* 107* 115*  BUN 10 12 25* 34* 33* 22  CREATININE 0.87 0.91 0.95 0.99 0.97 1.01  CALCIUM 8.8 8.8 9.2 9.6 9.6 9.5  MG 2.0  --  2.3  --   --   --  PHOS 1.8* 2.1* 2.9  --   --   --    Liver Function Tests:  Recent Labs Lab 09/30/12 0345  AST 19  ALT 6  ALKPHOS 71  BILITOT 0.9  PROT 7.6  ALBUMIN 2.6*   CBC:  Recent Labs Lab 10/01/12 0405 10/02/12 1201 10/03/12 0435 10/04/12 0455 10/05/12 0555  WBC 10.9* 9.7 9.8 10.0  10.2  HGB 11.8* 11.9* 11.3* 10.8* 10.7*  HCT 36.1 36.7 34.5* 33.4* 33.0*  MCV 84.1 85.0 84.8 82.9 83.5  PLT 265 303 286 283 325   BNP (last 3 results)  Recent Labs  01/20/12 1621 09/30/12 0345 10/01/12 0405  PROBNP 25.9 2211.0* 964.6*   CBG:  Recent Labs Lab 10/01/12 1505 10/04/12 2123  GLUCAP 104* 109*    Recent Results (from the past 240 hour(s))  URINE CULTURE     Status: None   Collection Time    09/27/12  6:59 PM      Result Value Range Status   Specimen Description URINE, CATHETERIZED   Final   Special Requests NONE   Final   Culture  Setup Time 09/28/2012 03:06   Final   Colony Count NO GROWTH   Final   Culture NO GROWTH   Final   Report Status 09/28/2012 FINAL   Final  MRSA PCR SCREENING     Status: None   Collection Time    09/27/12  7:03 PM      Result Value Range Status   MRSA by PCR NEGATIVE  NEGATIVE Final   Comment:            The GeneXpert MRSA Assay (FDA     approved for NASAL specimens     only), is one component of a     comprehensive MRSA colonization     surveillance program. It is not     intended to diagnose MRSA     infection nor to guide or     monitor treatment for     MRSA infections.  URINE CULTURE     Status: None   Collection Time    09/30/12  6:42 AM      Result Value Range Status   Specimen Description URINE, CATHETERIZED   Final   Special Requests Normal   Final   Culture  Setup Time 09/30/2012 07:06   Final   Colony Count NO GROWTH   Final   Culture NO GROWTH   Final   Report Status 10/01/2012 FINAL   Final  CULTURE, BLOOD (ROUTINE X 2)     Status: None   Collection Time    09/30/12 10:46 AM      Result Value Range Status   Specimen Description BLOOD ARM RIGHT   Final   Special Requests BOTTLES DRAWN AEROBIC AND ANAEROBIC 10CC   Final   Culture  Setup Time 09/30/2012 17:19   Final   Culture     Final   Value:        BLOOD CULTURE RECEIVED NO GROWTH TO DATE CULTURE WILL BE HELD FOR 5 DAYS BEFORE ISSUING A FINAL  NEGATIVE REPORT   Report Status PENDING   Incomplete  CULTURE, BLOOD (ROUTINE X 2)     Status: None   Collection Time    09/30/12 10:50 AM      Result Value Range Status   Specimen Description BLOOD RIGHT ANTECUBITAL   Final   Special Requests BOTTLES DRAWN AEROBIC AND ANAEROBIC 10CC   Final   Culture  Setup  Time 09/30/2012 17:19   Final   Culture     Final   Value:        BLOOD CULTURE RECEIVED NO GROWTH TO DATE CULTURE WILL BE HELD FOR 5 DAYS BEFORE ISSUING A FINAL NEGATIVE REPORT   Report Status PENDING   Incomplete    Scheduled Meds:  Scheduled Meds: . antiseptic oral rinse  15 mL Mouth Rinse QID  . aspirin  325 mg Oral Daily  . carvedilol  6.25 mg Oral BID WC  . chlorhexidine  15 mL Mouth Rinse BID  . furosemide  80 mg Intravenous Q12H  . potassium chloride  40 mEq Oral Once  . simvastatin  20 mg Oral q1800   Continuous Infusions: . sodium chloride 10 mL/hr at 10/03/12 0500  . heparin 2,750 Units/hr (10/05/12 0527)     Osvaldo Shipper  [facility]  [phone] Pager - Text Page per Loretha Stapler as per below:  On-Call/Text Page:      [email or web address]      password [email or web address]  If 7PM-7AM, please contact night-coverage [email or web address] Password [facility] 10/05/2012, 12:48 PM   LOS: 8 days

## 2012-10-05 NOTE — Evaluation (Signed)
Occupational Therapy Evaluation Patient Details Name: Breanna Wilkins MRN: [id number] DOB: [birth year] Today's Date: 10/05/2012 Time: 4098-1191 OT Time Calculation (min): 33 min  OT Assessment / Plan / Recommendation Clinical Impression  52 yo female admitted with Ventral Hernia Repair with post-op STEMI.  Plan for Cardiac Cath tomorrow and Cardiac Rehab post cath. Ot to follow actuely. Recommend HHOT for d/c planning    OT Assessment  Patient needs continued OT Services    Follow Up Recommendations  Home health OT    Barriers to Discharge      Equipment Recommendations  Other (comment) (TBA)    Recommendations for Other Services    Frequency  Min 2X/week    Precautions / Restrictions Precautions Precautions: Fall Restrictions Weight Bearing Restrictions: No   Pertinent Vitals/Pain     ADL  Eating/Feeding: Set up Where Assessed - Eating/Feeding: Chair Grooming: Wash/dry face;Teeth care;Min guard Where Assessed - Grooming: Unsupported standing Lower Body Bathing: Min guard Where Assessed - Lower Body Bathing: Unsupported standing (able to squat and perform peri care with wash cloth) Lower Body Dressing: +1 Total assistance Where Assessed - Lower Body Dressing: Supine, head of bed up (able to cross bil LE reports SOB and pain at groining) Toilet Transfer: Supervision/safety Statistician Method: Sit to Barista: Extra wide bedside commode Toileting - Architect and Hygiene: Min guard Where Assessed - Toileting Clothing Manipulation and Hygiene: Sit to stand from 3-in-1 or toilet Equipment Used: Other (comment) (holding the IV pole ) Transfers/Ambulation Related to ADLs: pt completes sit<>stand Min guard (A) with bil UE support . Pt with large BOS ADL Comments: Pt requesting bedside due to urgency, brush teeth and ambulation this session. Pt demonstrates peri care with wash cloth and does not like to use toilet paper for hygiene. Pt  educated on progressing after cardiac cath Wednesday with energy conservation education. Pt demonstrates x2 during session LOB with self correction.    OT Diagnosis: Generalized weakness  OT Problem List: Decreased strength;Decreased activity tolerance;Impaired balance (sitting and/or standing);Decreased safety awareness;Decreased knowledge of use of DME or AE;Decreased knowledge of precautions;Obesity OT Treatment Interventions: Self-care/ADL training;DME and/or AE instruction;Energy conservation;Therapeutic exercise;Cognitive remediation/compensation;Balance training;Patient/family education   OT Goals Acute Rehab OT Goals OT Goal Formulation: With patient Time For Goal Achievement: 10/19/12 Potential to Achieve Goals: Good ADL Goals Pt Will Perform Upper Body Bathing: with modified independence;Sit to stand from chair (using EC) ADL Goal: Upper Body Bathing - Progress: Goal set today Pt Will Perform Lower Body Bathing: with modified independence;Sit to stand from chair (using EC) ADL Goal: Lower Body Bathing - Progress: Goal set today Pt Will Perform Upper Body Dressing: with modified independence;Sit to stand from chair (using EC) ADL Goal: Upper Body Dressing - Progress: Goal set today Pt Will Perform Lower Body Dressing: with modified independence;Sit to stand from chair (using EC) ADL Goal: Lower Body Dressing - Progress: Goal set today Miscellaneous OT Goals Miscellaneous OT Goal #1: Pt will complete Berg with score of 50 or greater to demonstrate risk of falling with ADLS OT Goal: Miscellaneous Goal #1 - Progress: Goal set today  Visit Information  Last OT Received On: 10/05/12 Assistance Needed: +1 PT/OT Co-Evaluation/Treatment: Yes    Subjective Data  Subjective: "My roommate will be here later and she can help me bath" Patient Stated Goal: to go to cardiac rehab   Prior Functioning     Home Living Lives With: Other (Comment) (roommate) Available Help at Discharge:  Friend(s) Type of Home:  Mobile home Home Access: Stairs to enter Entrance Stairs-Number of Steps: 2 Entrance Stairs-Rails: Right;Left Home Layout: One level;Other (Comment) (steps up and down within the trailer) Bathroom Shower/Tub: Tub/shower unit;Curtain Firefighter: Standard Home Adaptive Equipment: Straight cane;Bedside commode/3-in-1 Prior Function Level of Independence: Independent with assistive device(s);Independent Able to Take Stairs?: Yes Communication Communication: No difficulties Dominant Hand: Right         Vision/Perception Vision - History Baseline Vision: No visual deficits Patient Visual Report: No change from baseline   Cognition  Cognition Overall Cognitive Status: Appears within functional limits for tasks assessed/performed Arousal/Alertness: Awake/alert Orientation Level: Appears intact for tasks assessed Behavior During Session: [facility] for tasks performed    Extremity/Trunk Assessment Right Upper Extremity Assessment RUE ROM/Strength/Tone: WFL for tasks assessed RUE Sensation: WFL - Light Touch RUE Coordination: WFL - gross/fine motor Left Upper Extremity Assessment LUE ROM/Strength/Tone: WFL for tasks assessed LUE Sensation: WFL - Light Touch LUE Coordination: WFL - gross/fine motor Trunk Assessment Trunk Assessment: Normal     Mobility Bed Mobility Bed Mobility: Supine to Sit;Sitting - Scoot to Edge of Bed Supine to Sit: 5: Supervision;With rails;HOB elevated Sitting - Scoot to Edge of Bed: 5: Supervision Details for Bed Mobility Assistance: cues for pursed lip breathing, pt requires use of rail to complete without A.   Transfers Sit to Stand: 5: Supervision;With upper extremity assist;From bed;From chair/3-in-1;With armrests Stand to Sit: 5: Supervision;With upper extremity assist;To chair/3-in-1;With armrests;To bed Details for Transfer Assistance: pt moves slowly, but demos good use of UEs.       Exercise     Balance  Balance Balance Assessed: Yes Static Standing Balance Static Standing - Balance Support: No upper extremity supported;During functional activity Static Standing - Level of Assistance: 5: Stand by assistance Static Standing - Comment/# of Minutes: placing BIL UE on sink during static standing for oral care. O2 sats 92-94% on 3L O2   End of Session OT - End of Session Activity Tolerance: Patient tolerated treatment well Patient left: in chair;with call bell/phone within reach Nurse Communication: Mobility status;Precautions  GO     Lucile Shutters 10/05/2012, 10:26 AM Pager: [phone]

## 2012-10-05 NOTE — Progress Notes (Signed)
Physical Therapy Treatment Patient Details Name: Breanna Wilkins MRN: [id number] DOB: Jul 19, 1961 Today's Date: 10/05/2012 Time: 4098-1191 PT Time Calculation (min): 35 min  PT Assessment / Plan / Recommendation Comments on Treatment Session  pt presents with Ventral Hernia Repair with post-op STEMI.  Plan for Cardiac Cath tomorrow and Cardiac Rehab post cath.  pt moving well with increased endurance today, though requires 3L O2 to maintain sats.      Follow Up Recommendations  Home health PT;Supervision - Intermittent     Does the patient have the potential to tolerate intense rehabilitation     Barriers to Discharge        Equipment Recommendations  None recommended by PT    Recommendations for Other Services    Frequency Min 3X/week   Plan Discharge plan remains appropriate;Frequency remains appropriate    Precautions / Restrictions Precautions Precautions: Fall Restrictions Weight Bearing Restrictions: No   Pertinent Vitals/Pain Indicates pain in R groin area.      Mobility  Bed Mobility Bed Mobility: Supine to Sit;Sitting - Scoot to Edge of Bed Supine to Sit: 5: Supervision;With rails;HOB elevated Sitting - Scoot to Edge of Bed: 5: Supervision Details for Bed Mobility Assistance: cues for pursed lip breathing, pt requires use of rail to complete without A.   Transfers Transfers: Sit to Stand;Stand to Sit Sit to Stand: 5: Supervision;With upper extremity assist;From bed;From chair/3-in-1;With armrests Stand to Sit: 5: Supervision;With upper extremity assist;To chair/3-in-1;With armrests;To bed Details for Transfer Assistance: pt moves slowly, but demos good use of UEs.   Ambulation/Gait Ambulation/Gait Assistance: 4: Min guard Ambulation Distance (Feet): 150 Feet Assistive device:  (IV pole) Ambulation/Gait Assistance Details: pt with moderate lateral sway.  C/O feeling SOB, cues for pursed lip breathing.  pt's O2 sats 93-94%on 3L O2.   Gait Pattern: Step-through  pattern Stairs: No Wheelchair Mobility Wheelchair Mobility: No    Exercises     PT Diagnosis:    PT Problem List:   PT Treatment Interventions:     PT Goals Acute Rehab PT Goals Time For Goal Achievement: 10/18/12 Potential to Achieve Goals: Good PT Goal: Supine/Side to Sit - Progress: Progressing toward goal PT Goal: Sit to Stand - Progress: Progressing toward goal PT Transfer Goal: Bed to Chair/Chair to Bed - Progress: Progressing toward goal PT Goal: Ambulate - Progress: Progressing toward goal  Visit Information  Last PT Received On: 10/05/12 Assistance Needed: +1    Subjective Data  Subjective: I need to get back to the gym and stop drinking.     Cognition  Cognition Overall Cognitive Status: Appears within functional limits for tasks assessed/performed Arousal/Alertness: Awake/alert Orientation Level: Appears intact for tasks assessed Behavior During Session: [facility] for tasks performed    Balance  Balance Balance Assessed: Yes Static Standing Balance Static Standing - Balance Support: No upper extremity supported;During functional activity Static Standing - Level of Assistance: 5: Stand by assistance Static Standing - Comment/# of Minutes: pt able to stand at sink performing ADL tasks without A though occasionally uses leaning trunk against counter top for support due to SOB and fatigue.  pt's O2 sats 92-94% on 3L O2.    End of Session PT - End of Session Equipment Utilized During Treatment: Oxygen Activity Tolerance: Patient limited by fatigue Patient left: in chair;with call bell/phone within reach Nurse Communication: Mobility status   GP     Sunny Schlein, [place] [phone] 10/05/2012, 9:06 AM

## 2012-10-05 NOTE — Progress Notes (Signed)
8 Days Post-Op  Subjective: Transferred to 3 W. Telemetry. Alert and hemodynamically stable. Ambulating in halls with some dyspnea on exertion. Denies abdominal pain. Tolerating diet. Having bowel movements.   I appreciate all the management provided by cardiology, [facility] hospitalists, and CCM. Empiric antibiotics have been discontinued due to lack of evidence for pneumonia.  She admits to having dyspnea on exertion and some chest discomfort prior to dmission, but states that she kept  it to herself. She states that she would drink whiskey daily, perhaps half a pint. She states that she is going to abstain from that in the future. I encouraged that. I encouraged the eventual cardiac rehabilitation and weight loss. She seems to have good insight into the necessity of this.  Remains on heparin drip. Cardiology plans for cardiac cath tomorrow noted.  Objective: Vital signs in last 24 hours: Temp:  [97.9 F (36.6 C)-99.6 F (37.6 C)] 99.6 F (37.6 C) (03/18 0541) Pulse Rate:  [76-93] 81 (03/18 0541) Resp:  [13-23] 20 (03/18 0541) BP: (102-131)/(63-90) 129/64 mmHg (03/18 0541) SpO2:  [98 %-100 %] 98 % (03/18 0541) Weight:  [296 lb 1.6 oz (134.31 kg)] 296 lb 1.6 oz (134.31 kg) (03/18 0541) Last BM Date: 10/05/12  Intake/Output from previous day: 03/17 0701 - 03/18 0700 In: 1222.5 [P.O.:360; I.V.:312.5; IV Piggyback:550] Out: 1950 [Urine:1950] Intake/Output this shift: Total I/O In: -  Out: 850 [Urine:850]  General appearance: alert. Cooperative. No distress. Sitting up on the edge of the bed. Resp:  some crackles at bases, otherwise clear GI: obese. Soft. Not distended. Wounds looked good. Benign postop exam.  Lab Results:  Results for orders placed during the hospital encounter of 09/27/12 (from the past 24 hour(s))  HEPARIN LEVEL (UNFRACTIONATED)     Status: Abnormal   Collection Time    10/04/12  7:51 AM      Result Value Range   Heparin Unfractionated 0.96 (*) 0.30 - 0.70 IU/mL   HEPARIN LEVEL (UNFRACTIONATED)     Status: None   Collection Time    10/04/12  3:27 PM      Result Value Range   Heparin Unfractionated 0.50  0.30 - 0.70 IU/mL  GLUCOSE, CAPILLARY     Status: Abnormal   Collection Time    10/04/12  9:23 PM      Result Value Range   Glucose-Capillary 109 (*) 70 - 99 mg/dL   Comment 1 Notify RN       Studies/Results: @RISRSLT24 @  . antiseptic oral rinse  15 mL Mouth Rinse QID  . aspirin  300 mg Rectal Daily  . carvedilol  6.25 mg Oral BID WC  . chlorhexidine  15 mL Mouth Rinse BID  . furosemide  80 mg Intravenous Q12H  . polyethylene glycol  17 g Oral BID  . simvastatin  20 mg Oral q1800     Assessment/Plan: s/p Procedure(s): LAPAROSCOPIC INCISIONAL HERNIA possible open INSERTION OF MESH   POD #8. Laparoscopic ventral hernia repair with mesh. No obvious surgical complications.  Heart healthy diet. GI function has normalized  Ambulated Breanna Wilkins  She actually meets discharge criteria, strictly from an abdominal surgery, GI standpoint Currently transferred to [facility] hospitalist service.  Acute perioperative lateral STEMI., without hemodynamic compromise.Associated acute mixed systolic/diastolic CHF Arm twice a day Lasix IV. No chest pain.  Remains on heparin drip. Cardiac cath planned for tomorrow Eventually will need to restart Coumadin  Pulmonary edema, presumably cardiogenic.Marland Kitchen Receiving Lasix and fluid restriction. .    Nutrition.  low-fat, heart  healthy diet. Tolerating well.  History pulmonary embolism, on chronic Coumadin As an outpatient.   History of sleep apnea   History hypertension-On Lopressor   On PPI for ulcer prophylaxis  .  Morbid obesity   Followed as an outpatient by Rosana Berger at the [facility] internal medicine clinic.    @PROBHOSP @  LOS: 8 days    Willy Pinkerton M. Derrell Lolling, M.D., [facility] General and Minimally invasive Surgery Breast and Colorectal Surgery Office:   [phone] Pager:    [phone]  10/05/2012  . .prob

## 2012-10-05 NOTE — Progress Notes (Signed)
Patient ID: Breanna Wilkins, female   DOB: November 06, 1960, 52 y.o.   MRN: [id number] SUBJECTIVE:   Breanna Wilkins is a 52 yo female admitted for repiar of an incisional hernia.  She has a hx of morbid obesity, HTN, sleep apnea, pulmonary embolus.   She had a post -op MI.  Echo  Shows reduced LV function with inferior wall hypokinesis.   No complaints of chest pain She is short of breath though this continues to improve  OBJECTIVE:   Vitals:   Filed Vitals:   10/04/12 1809 10/04/12 2100 10/05/12 0541 10/05/12 1400  BP: 119/81 112/90 129/64 113/53  Pulse: 89 90 81 79  Temp:  97.9 F (36.6 C) 99.6 F (37.6 C) 98.6 F (37 C)  TempSrc:  Oral Oral   Resp:  20 20 18   Height:      Weight:   296 lb 1.6 oz (134.31 kg)   SpO2:  99% 98% 100%   I&O's:    Intake/Output Summary (Last 24 hours) at 10/05/12 1656 Last data filed at 10/05/12 1200  Gross per 24 hour  Intake    840 ml  Output    850 ml  Net    -10 ml   TELEMETRY: Reviewed telemetry pt in NSR: 10/05/2012   PHYSICAL EXAM General: Morbidly obese black female Head: Eyes PERRLA, No xanthomas.   Normal cephalic and atramatic  Lungs:   Crackles at bases. Heart:   HRRR S1 S2 Pulses are 2+ & equal. Abdomen: Bowel sounds are positive, abdomen soft and non-tender without masses Extremities:   No clubbing, cyanosis or edema.  DP +1 Neuro: Alert and oriented X 3. Psych:  Good affect, responds appropriately S/P right inguinal hernia repair   LABS: Basic Metabolic Panel:  Recent Labs  78/29/56 0555  NA 136  K 3.3*  CL 98  CO2 27  GLUCOSE 115*  BUN 22  CREATININE 1.01  CALCIUM 9.5   CBC:  Recent Labs  10/04/12 0455 10/05/12 0555  WBC 10.0 10.2  HGB 10.8* 10.7*  HCT 33.4* 33.0*  MCV 82.9 83.5  PLT 283 325   Fasting Lipid Panel: No results found for this basename: CHOL, HDL, LDLCALC, TRIG, CHOLHDL, LDLDIRECT,  in the last 72 hours Coag Panel:   Lab Results  Component Value Date   INR 1.07 09/27/2012   INR 2.39*  09/20/2012   INR 2.60 09/13/2012     ASSESSMENT:  1.  Acute periop Lateral wall MI on IV Heparin gtt/ASA/beta blocker Will tentatively schedule cath for Wendsday preferably radially Risks, benefits, and alternatives to cath were discussed with aptient who wishes to proceed. 2.  Acute mixed systolic/diastolic CHF on IV diuretics.  Continue current meds.   3.  Reduced LVF on echo 4.  S/p laparoscopic ventral hernia repair with mesh  PLAN:    1.Continue Lasix for diuresis - she still has crackles at bases, renal function stable 2.  Oral beta blocker  3. Cath in am   Hillis Range, MD  10/05/2012  4:56 PM

## 2012-10-05 NOTE — Progress Notes (Signed)
ANTICOAGULATION CONSULT NOTE - Follow Up Consult  Pharmacy Consult for Heparin  Indication: STEMI s/p ventral hernia repair; hx DVT/PE  Allergies  Allergen Reactions  . Aspirin     REACTION: Nausea (325mg )    Patient Measurements: Height: 5\' 7"  (170.2 cm) Weight: 296 lb 1.6 oz (134.31 kg) IBW/kg (Calculated) : 61.6 Heparin Dosing Weight: 94kg   Vital Signs: Temp: 99.6 F (37.6 C) (03/18 0541) Temp src: Oral (03/18 0541) BP: 129/64 mmHg (03/18 0541) Pulse Rate: 81 (03/18 0541)  Labs:  Recent Labs  10/03/12 0435 10/04/12 0455 10/04/12 0751 10/04/12 1527 10/05/12 0555  HGB 11.3* 10.8*  --   --  10.7*  HCT 34.5* 33.4*  --   --  33.0*  PLT 286 283  --   --  325  HEPARINUNFRC 0.52 0.82* 0.96* 0.50 0.62  CREATININE  --   --   --   --  1.01    Estimated Creatinine Clearance: 92.2 ml/min (by C-G formula based on Cr of 1.01).   Medications:  . antiseptic oral rinse  15 mL Mouth Rinse QID  . aspirin  300 mg Rectal Daily  . carvedilol  6.25 mg Oral BID WC  . chlorhexidine  15 mL Mouth Rinse BID  . furosemide  80 mg Intravenous Q12H  . polyethylene glycol  17 g Oral BID  . simvastatin  20 mg Oral q1800  . [DISCONTINUED] ceFEPime (MAXIPIME) IV  2 g Intravenous Q8H  . [DISCONTINUED] vancomycin  1,250 mg Intravenous Q12H  . [DISCONTINUED] vancomycin  1,500 mg Intravenous Q12H   Assessment: 52 year old female with STEMI s/p ventral hernia repair on heparin for history of PE/DVT while Coumadin is on hold. Patient was on Coumadin PTA (5mg  daily except 10mg  on Wed, verified with pt) and is followed by Dr. Alexandria Lodge.   HL is therapuetic at 0.62. H/H 10.7/33 and plts 325. There is no evidence of bleeding at this time.   Goal of Therapy:  Heparin level 0.3-0.7 units/ml Monitor platelets by anticoagulation protocol: Yes   Plan:  1. Continue heparin drip at 2750 uints/hr  2. Heparin level and CBC daily   Micheline Chapman PharmD Candidate 10/05/2012,8:48 AM  Agree with  Rebecca's assessment and plan. Dorna Leitz, PharmD, BCPS

## 2012-10-06 ENCOUNTER — Encounter (HOSPITAL_COMMUNITY)
Admission: RE | Disposition: A | Discharge status: Home / Self Care | Payer: Self-pay | Source: Ambulatory Visit | Attending: General Surgery

## 2012-10-06 DIAGNOSIS — I2510 Atherosclerotic heart disease of native coronary artery without angina pectoris: Secondary | ICD-10-CM

## 2012-10-06 HISTORY — PX: LEFT HEART CATHETERIZATION WITH CORONARY ANGIOGRAM: SHX5451

## 2012-10-06 LAB — BASIC METABOLIC PANEL
Chloride: 100 mEq/L (ref 96–112)
GFR calc Af Amer: 57 mL/min — ABNORMAL LOW (ref >90)
GFR calc non Af Amer: 49 mL/min — ABNORMAL LOW (ref >90)
Potassium: 4.1 mEq/L (ref 3.5–5.1)
Sodium: 139 mEq/L (ref 135–145)

## 2012-10-06 LAB — CULTURE, BLOOD (ROUTINE X 2)

## 2012-10-06 LAB — CBC
Hemoglobin: 11.3 g/dL — ABNORMAL LOW (ref 12.0–15.0)
Hemoglobin: 11.4 g/dL — ABNORMAL LOW (ref 12.0–15.0)
MCH: 27.4 pg (ref 26.0–34.0)
MCHC: 32.6 g/dL (ref 30.0–36.0)
RBC: 4.24 MIL/uL (ref 3.87–5.11)
RBC: 4.16 MIL/uL (ref 3.87–5.11)
WBC: 10.1 10*3/uL (ref 4.0–10.5)
WBC: 8.3 10*3/uL (ref 4.0–10.5)

## 2012-10-06 LAB — CREATININE, SERUM
Creatinine, Ser: 1.19 mg/dL — ABNORMAL HIGH (ref 0.50–1.10)
GFR calc Af Amer: 59 mL/min — ABNORMAL LOW (ref >90)
GFR calc non Af Amer: 51 mL/min — ABNORMAL LOW (ref >90)

## 2012-10-06 LAB — HEPARIN LEVEL (UNFRACTIONATED)
Heparin Unfractionated: 1.04 IU/mL — ABNORMAL HIGH (ref 0.30–0.70)
Heparin Unfractionated: 1.17 IU/mL — ABNORMAL HIGH (ref 0.30–0.70)

## 2012-10-06 LAB — PROTIME-INR
INR: 1.06 (ref 0.00–1.49)
Prothrombin Time: 13.7 seconds (ref 11.6–15.2)

## 2012-10-06 SURGERY — LEFT HEART CATHETERIZATION WITH CORONARY ANGIOGRAM
Anesthesia: LOCAL

## 2012-10-06 MED ORDER — HEPARIN (PORCINE) IN NACL 2-0.9 UNIT/ML-% IJ SOLN
INTRAMUSCULAR | Status: AC
Start: 2012-10-06 — End: 2012-10-06
  Filled 2012-10-06: qty 1000

## 2012-10-06 MED ORDER — HEPARIN SODIUM (PORCINE) 5000 UNIT/ML IJ SOLN
5000.0000 [IU] | Freq: Three times a day (TID) | INTRAMUSCULAR | Status: DC
  Administered 2012-10-06 – 2012-10-07 (×3): 5000 [IU] via SUBCUTANEOUS
  Filled 2012-10-06 (×2): qty 1

## 2012-10-06 MED ORDER — MIDAZOLAM HCL 2 MG/2ML IJ SOLN
INTRAMUSCULAR | Status: AC
  Filled 2012-10-06: qty 2

## 2012-10-06 MED ORDER — VERAPAMIL HCL 2.5 MG/ML IV SOLN
INTRAVENOUS | Status: AC
  Filled 2012-10-06: qty 2

## 2012-10-06 MED ORDER — FENTANYL CITRATE 0.05 MG/ML IJ SOLN
INTRAMUSCULAR | Status: AC
  Filled 2012-10-06: qty 2

## 2012-10-06 MED ORDER — HEPARIN SODIUM (PORCINE) 1000 UNIT/ML IJ SOLN
INTRAMUSCULAR | Status: AC
  Filled 2012-10-06: qty 1

## 2012-10-06 MED ORDER — SODIUM CHLORIDE 0.9 % IV SOLN
1.0000 mL/kg/h | INTRAVENOUS | Status: AC
  Administered 2012-10-06: 1 mL/kg/h via INTRAVENOUS

## 2012-10-06 MED ORDER — LIDOCAINE HCL (PF) 1 % IJ SOLN
INTRAMUSCULAR | Status: AC
  Filled 2012-10-06: qty 30

## 2012-10-06 MED ORDER — SODIUM CHLORIDE 0.9 % IJ SOLN: 3.0000 mL | INTRAMUSCULAR | Status: DC | PRN

## 2012-10-06 MED ORDER — SODIUM CHLORIDE 0.9 % IJ SOLN
3.0000 mL | Freq: Two times a day (BID) | INTRAMUSCULAR | Status: DC
  Administered 2012-10-06 – 2012-10-07 (×2): 3 mL via INTRAVENOUS

## 2012-10-06 MED ORDER — ACETAMINOPHEN 325 MG PO TABS: 650.0000 mg | ORAL_TABLET | ORAL | Status: DC | PRN

## 2012-10-06 MED ORDER — SODIUM CHLORIDE 0.9 % IV SOLN: 250.0000 mL | INTRAVENOUS | Status: DC | PRN

## 2012-10-06 MED ORDER — HEPARIN (PORCINE) IN NACL 100-0.45 UNIT/ML-% IJ SOLN
2450.0000 [IU]/h | INTRAMUSCULAR | Status: DC
  Administered 2012-10-06 (×2): 2450 [IU]/h via INTRAVENOUS
  Filled 2012-10-06 (×2): qty 250

## 2012-10-06 NOTE — CV Procedure (Signed)
   Cardiac Catheterization Procedure Note  Name: Breanna Wilkins MRN: [id number] DOB: 10/28/1960  Procedure: Left Heart Cath, Selective Coronary Angiography, LV angiography  Indication: Lateral STEMI in setting of critical illness. Pt now recovered, extubated, presenting for cardiac cath and possible PCI.   Procedural Details: The right wrist was prepped, draped, and anesthetized with 1% lidocaine. Using the modified Seldinger technique, a 5 French sheath was introduced into the right radial artery. 3 mg of verapamil was administered through the sheath, weight-based unfractionated heparin was administered intravenously. Standard Judkins catheters were used for selective coronary angiography and left ventriculography. Catheter exchanges were performed over an exchange length guidewire. It was difficult to engage the left main. A JL 3.5 and an MPA were unsuccessful. I was able to engage the vessel with a TIG catheter successfully for nonselective imaging of the LCA but there was good coronary visualization into the distal vessels. There were no immediate procedural complications. A TR band was used for radial hemostasis at the completion of the procedure.  The patient was transferred to the post catheterization recovery area for further monitoring.  Procedural Findings: Hemodynamics: AO 138/74 LV 137/16  Coronary angiography: Coronary dominance: right  Left mainstem: Widely patent, no obstructive disease  Left anterior descending (LAD): Patent, tortuous vessel. Reaches the LV apex. There is no obstructive disease noted.   Left circumflex (LCx): Large intermediate branch without significant stenosis. AV circumflex is patent without significant disease.  Right coronary artery (RCA): Large, dominant vessel with moderate tortuosity. There is mild 20-30% stenosis in the mid vessel. The PDA and PLA branches are patent.  Left ventriculography: Left ventricular systolic function is vigorous, LVEF  is estimated at 65-70%, there is no significant mitral regurgitation   Final Conclusions:   1. Minor nonobstructive CAD 2. Vigorous LV function  Recommendations: Unclear what occurred in the setting of her critical illness. I do not appreciate significant CAD to explain EKG/enzyme changes and there are no wall motion abnormalities seen on ventriculography. Possibilities include coronary vasospastic event versus cardiomyopathy of critical illness versus stress cardiomyopathy. She should have good prognosis from cardiac perspective.   Tonny Bollman 10/06/2012, 2:51 PM

## 2012-10-06 NOTE — Progress Notes (Signed)
UR Completed Keawe Marcello Graves-Bigelow, RN,BSN [phone]  

## 2012-10-06 NOTE — Progress Notes (Signed)
ANTICOAGULATION CONSULT NOTE - Follow Up Consult  Pharmacy Consult for Heparin  Indication: STEMI s/p ventral hernia repair; hx DVT/PE  Allergies  Allergen Reactions  . Aspirin     REACTION: Nausea (325mg )   Patient Measurements: Height: 5\' 7"  (170.2 cm) Weight: 296 lb 1.6 oz (134.31 kg) IBW/kg (Calculated) : 61.6 Heparin Dosing Weight: 94kg   Vital Signs: Temp: 97.9 F (36.6 C) (03/19 0541) Temp src: Oral (03/19 0541) BP: 110/74 mmHg (03/19 0541) Pulse Rate: 92 (03/19 0541)  Labs:  Recent Labs  10/04/12 0455  10/05/12 0555 10/06/12 0454 10/06/12 0620  HGB 10.8*  --  10.7* 11.3*  --   HCT 33.4*  --  33.0* 34.7*  --   PLT 283  --  325 319  --   LABPROT  --   --   --  13.7  --   INR  --   --   --  1.06  --   HEPARINUNFRC 0.82*  < > 0.62 1.04* 1.17*  CREATININE  --   --  1.01 1.23*  --   < > = values in this interval not displayed.  Estimated Creatinine Clearance: 75.7 ml/min (by C-G formula based on Cr of 1.23).   Medications:  . antiseptic oral rinse  15 mL Mouth Rinse QID  . aspirin  325 mg Oral Daily  . carvedilol  6.25 mg Oral BID WC  . chlorhexidine  15 mL Mouth Rinse BID  . furosemide  80 mg Intravenous Q12H  . [EXPIRED] polyethylene glycol  17 g Oral BID  . [COMPLETED] potassium chloride  40 mEq Oral Once  . simvastatin  20 mg Oral q1800  . sodium chloride  3 mL Intravenous Q12H  . [DISCONTINUED] aspirin  300 mg Rectal Daily   Assessment: 52 year old female with STEMI s/p ventral hernia repair on heparin for history of PE/DVT while Coumadin is on hold. Patient was on Coumadin PTA (5mg  daily except 10mg  on Wed, verified with pt) and is followed by Dr. Alexandria Lodge.   Heparin level (1.04, 1.17) is above-goal on 2750 units/hr. Confirmed with RN lab drawn appropriately.   Goal of Therapy:  Heparin level 0.3-0.7 units/ml Monitor platelets by anticoagulation protocol: Yes   Plan:  1. Hold heparin x 1 hour, then restart IV heparin at 2450 units/hr.  2. Heparin  level in 6 hours vs follow-up post-cath.   Emeline Gins 10/06/2012,6:56 AM

## 2012-10-06 NOTE — Progress Notes (Signed)
Physical Therapy Treatment Patient Details Name: Breanna Wilkins MRN: [id number] DOB: 24-Jun-1961 Today's Date: 10/06/2012 Time: 1016-1040 PT Time Calculation (min): 24 min  PT Assessment / Plan / Recommendation Comments on Treatment Session  Pt presents with ventral hernia repair; post-op STEMI; plans to have cardiac cath today. Pt with overall decreased endurance; requires increased time to complete tasks. Amb on 2L of O2 in order to maintain stats. Pt seems very motivated to increase activity and improve functional status.    Follow Up Recommendations  Home health PT;Supervision - Intermittent     Does the patient have the potential to tolerate intense rehabilitation     Barriers to Discharge        Equipment Recommendations  None recommended by PT    Recommendations for Other Services    Frequency Min 3X/week   Plan Discharge plan remains appropriate;Frequency remains appropriate    Precautions / Restrictions Precautions Precautions: Fall Restrictions Weight Bearing Restrictions: No   Pertinent Vitals/Pain No new complaints. Denies pain.    Mobility  Bed Mobility Bed Mobility: Not assessed Transfers Transfers: Sit to Stand;Stand to Sit Sit to Stand: 5: Supervision;From chair/3-in-1;With armrests Stand to Sit: 5: Supervision;To chair/3-in-1;With armrests Details for Transfer Assistance: requires increased time to complete tasks. demo proper technique and use of bil UEs. Ambulation/Gait Ambulation/Gait Assistance: 5: Supervision Ambulation Distance (Feet): 120 Feet Assistive device:  (uses IV pole for support) Ambulation/Gait Assistance Details: cues for upright posture; mild SOB able to carry on a conversation; pushes IV pole with bil UEs for support secondary to mild balance deficits.  Gait Pattern: Step-through pattern Gait velocity: decreased Stairs: No Wheelchair Mobility Wheelchair Mobility: No    Exercises     PT Diagnosis:    PT Problem List:   PT  Treatment Interventions:     PT Goals Acute Rehab PT Goals PT Goal Formulation: With patient Time For Goal Achievement: 10/18/12 Potential to Achieve Goals: Good PT Goal: Sit to Stand - Progress: Progressing toward goal PT Transfer Goal: Bed to Chair/Chair to Bed - Progress: Progressing toward goal PT Goal: Ambulate - Progress: Progressing toward goal  Visit Information  Last PT Received On: 10/06/12 Assistance Needed: +1    Subjective Data  Subjective: I need to walk and workout   Cognition  Cognition Overall Cognitive Status: Appears within functional limits for tasks assessed/performed Arousal/Alertness: Awake/alert Orientation Level: Appears intact for tasks assessed Behavior During Session: [facility] for tasks performed    Balance     End of Session PT - End of Session Equipment Utilized During Treatment: Oxygen;Gait belt Activity Tolerance: Patient tolerated treatment well Patient left: in chair;with call bell/phone within reach Nurse Communication: Mobility status   GP     Donell Sievert,  [phone] 10/06/2012, 10:51 AM

## 2012-10-06 NOTE — Progress Notes (Signed)
Pt has refused cpap for tonight.  She states her nose is runny and she doesn't want to wear it tonight.

## 2012-10-06 NOTE — Progress Notes (Signed)
Patient ID: Breanna Wilkins, female   DOB: 09-18-1960, 52 y.o.   MRN: [id number]   The patient is scheduled for cardiac catheterization mid day today.  Jerral Bonito, MD

## 2012-10-06 NOTE — Progress Notes (Signed)
[facility] Progress Note   Breanna Wilkins HYQ:[id number] DOB: 1960-09-25 DOA: 09/27/2012 PCP: Carrolyn Meiers, MD  Brief narrative: 52 year old female who underwent a ventral hernia repair on 3/10. Postop course was complicated by respiratory distress requiring reintubation. Cardiac enzymes were noted to be elevated and she was diagnosed with a perioperative MI with resultant pulmonary edema. A Cardiology consult was requested and a conservative approach was taken with initiation of diuretics, beta blockers, IV heparin and aspirin. An echo revealed severe hypokinesis of the inferior segment and the inferolateral segment of the mid LV along with hypokinesis of the apical aspect of the RV free wall. Moderate LVH was noted as well. She was extubated 3/15, and her consultative care was transferred to the [facility].  Assessment/Plan:  VDRF: (A) Pulmonary edema in setting of STEMI (B) HCAP Resolved, i.e. extubated and doing well  ?HCAP Followup x-rays not convincing of a true infectious infiltrate - the patient is afebrile and her white count has normalized. Antibiotics were discontinued on 3/17. We will follow clinically  Acute peri-operative lateral wall MI Cards is planning for cardiac cath 3/19 - med tx for now. Change to oral aspirin.  Acute mixed systolic and diastolic CHF Diuresis per Cardiology - agree that patient remains volume overloaded at present - resume ACE when BP improves and when patient is post cath/dye load. Check RA sats.  h/o DVT and PE - warfarin anticoagulation as outpatient Currently receiving Heparin   Ventral hernia with obstruction - S/p laparoscopic ventral hernia repair with mesh Care as per Gen Surgery - now advancing diet  Morbid obestiy Stable. Will need weight loss counseling.  Obstructive sleep apnea Continue nightly CPAP  History of HTN Currently well-controlled - follow trend  Code Status: full code Family Communication:  discussed with patient Disposition Plan: PT/OT recommends HH  Consultants: [facility] Surgery  Procedures: 3/10 Ventral Hernia repair 3/10 - 3/15 intubated >> extubated  Antibiotics: Ancef 3/10- 3/11 Cefepime 3/13 >> 3/17 Vancomycin 3/13>> 3/17  DVT prophylaxis: IV Heparin  HPI/Subjective: Patient feels well no new complaints. Ambulated today. She denies chest pain.   Objective: Blood pressure 110/74, pulse 92, temperature 97.9 F (36.6 C), temperature source Oral, resp. rate 18, height 5\' 7"  (1.702 m), weight 134.31 kg (296 lb 1.6 oz), SpO2 97.00%.  Intake/Output Summary (Last 24 hours) at 10/06/12 1104 Last data filed at 10/06/12 0659  Gross per 24 hour  Intake   1170 ml  Output    750 ml  Net    420 ml    Exam: General: No acute respiratory distress at rest Lungs: Crackles at the bases without wheezing.  Cardiovascular: Regular rate and rhythm without murmur gallop or rub normal S1 and S2 Abdomen: Obese, nondistended, soft, bowel sounds positive, no rebound, no ascites, no appreciable mass Extremities: No significant cyanosis or clubbing, Improving bilateral lower extremity edema. Now about 1+  Data Reviewed: Basic Metabolic Panel:  Recent Labs Lab 09/30/12 0345 10/01/12 0405 10/01/12 2209 10/02/12 0505 10/05/12 0555 10/06/12 0454  NA 138 138 141 141 136 139  K 3.4* 3.5 3.8 3.6 3.3* 4.1  CL 98 97 101 102 98 100  CO2 27 28 25 28 27 27   GLUCOSE 117* 123* 111* 107* 115* 114*  BUN 12 25* 34* 33* 22 29*  CREATININE 0.91 0.95 0.99 0.97 1.01 1.23*  CALCIUM 8.8 9.2 9.6 9.6 9.5 10.1  MG  --  2.3  --   --   --   --  PHOS 2.1* 2.9  --   --   --   --    Liver Function Tests:  Recent Labs Lab 09/30/12 0345  AST 19  ALT 6  ALKPHOS 71  BILITOT 0.9  PROT 7.6  ALBUMIN 2.6*   CBC:  Recent Labs Lab 10/02/12 1201 10/03/12 0435 10/04/12 0455 10/05/12 0555 10/06/12 0454  WBC 9.7 9.8 10.0 10.2 10.1  HGB 11.9* 11.3* 10.8* 10.7* 11.3*  HCT 36.7  34.5* 33.4* 33.0* 34.7*  MCV 85.0 84.8 82.9 83.5 81.8  PLT 303 286 283 325 319   BNP (last 3 results)  Recent Labs  01/20/12 1621 09/30/12 0345 10/01/12 0405  PROBNP 25.9 2211.0* 964.6*   CBG:  Recent Labs Lab 10/01/12 1505 10/04/12 2123  GLUCAP 104* 109*    Recent Results (from the past 240 hour(s))  URINE CULTURE     Status: None   Collection Time    09/27/12  6:59 PM      Result Value Range Status   Specimen Description URINE, CATHETERIZED   Final   Special Requests NONE   Final   Culture  Setup Time 09/28/2012 03:06   Final   Colony Count NO GROWTH   Final   Culture NO GROWTH   Final   Report Status 09/28/2012 FINAL   Final  MRSA PCR SCREENING     Status: None   Collection Time    09/27/12  7:03 PM      Result Value Range Status   MRSA by PCR NEGATIVE  NEGATIVE Final   Comment:            The GeneXpert MRSA Assay (FDA     approved for NASAL specimens     only), is one component of a     comprehensive MRSA colonization     surveillance program. It is not     intended to diagnose MRSA     infection nor to guide or     monitor treatment for     MRSA infections.  URINE CULTURE     Status: None   Collection Time    09/30/12  6:42 AM      Result Value Range Status   Specimen Description URINE, CATHETERIZED   Final   Special Requests Normal   Final   Culture  Setup Time 09/30/2012 07:06   Final   Colony Count NO GROWTH   Final   Culture NO GROWTH   Final   Report Status 10/01/2012 FINAL   Final  CULTURE, BLOOD (ROUTINE X 2)     Status: None   Collection Time    09/30/12 10:46 AM      Result Value Range Status   Specimen Description BLOOD ARM RIGHT   Final   Special Requests BOTTLES DRAWN AEROBIC AND ANAEROBIC 10CC   Final   Culture  Setup Time 09/30/2012 17:19   Final   Culture NO GROWTH 5 DAYS   Final   Report Status 10/06/2012 FINAL   Final  CULTURE, BLOOD (ROUTINE X 2)     Status: None   Collection Time    09/30/12 10:50 AM      Result Value Range  Status   Specimen Description BLOOD RIGHT ANTECUBITAL   Final   Special Requests BOTTLES DRAWN AEROBIC AND ANAEROBIC 10CC   Final   Culture  Setup Time 09/30/2012 17:19   Final   Culture NO GROWTH 5 DAYS   Final   Report Status 10/06/2012 FINAL   Final  Scheduled Meds:  Scheduled Meds: . antiseptic oral rinse  15 mL Mouth Rinse QID  . aspirin  325 mg Oral Daily  . carvedilol  6.25 mg Oral BID WC  . chlorhexidine  15 mL Mouth Rinse BID  . furosemide  80 mg Intravenous Q12H  . simvastatin  20 mg Oral q1800  . sodium chloride  3 mL Intravenous Q12H   Continuous Infusions: . sodium chloride 10 mL/hr at 10/03/12 0500  . heparin 2,450 Units/hr (10/06/12 0949)     Marley Charlot  [facility] Office  [phone] If 7PM-7AM, please contact night-coverage [email or web address] Password [email or web address] 10/06/2012, 11:04 AM   LOS: 9 days

## 2012-10-06 NOTE — Progress Notes (Signed)
9 Days Post-Op  Subjective: Patient is alert, ambulatory, stable. Still has Dyspnea when ambulating. Slight productive cough. Denies chest pain.  Tolerating diet. Abdomen a little sore. Having bowel movements.  Lab work shows hemoglobin 10.7, stable. WBC 10,200. Potassium 3.3. Glucose 115.  Objective: Vital signs in last 24 hours: Temp:  [98.5 F (36.9 C)-99.6 F (37.6 C)] 98.5 F (36.9 C) (03/18 2117) Pulse Rate:  [78-85] 78 (03/18 2323) Resp:  [18-20] 19 (03/18 2323) BP: (113-129)/(53-74) 114/74 mmHg (03/18 2117) SpO2:  [96 %-100 %] 98 % (03/18 2323) Weight:  [296 lb 1.6 oz (134.31 kg)] 296 lb 1.6 oz (134.31 kg) (03/18 0541) Last BM Date: 10/05/12  Intake/Output from previous day: 03/18 0701 - 03/19 0700 In: 600 [P.O.:600] Out: -  Intake/Output this shift:      General appearance: lert. Mental status normal. No distress. Cooperative. Good spirits. Resp: slight crackles at bases. No wheeze. Clear otherwise. GI: abdomen is obese, soft. Not distended. All wounds look fine. Small seroma and periumbilical hernia sac.  Lab Results:  Results for orders placed during the hospital encounter of 09/27/12 (from the past 24 hour(s))  HEPARIN LEVEL (UNFRACTIONATED)     Status: None   Collection Time    10/05/12  5:55 AM      Result Value Range   Heparin Unfractionated 0.62  0.30 - 0.70 IU/mL  CBC     Status: Abnormal   Collection Time    10/05/12  5:55 AM      Result Value Range   WBC 10.2  4.0 - 10.5 K/uL   RBC 3.95  3.87 - 5.11 MIL/uL   Hemoglobin 10.7 (*) 12.0 - 15.0 g/dL   HCT 81.1 (*) 91.4 - 78.2 %   MCV 83.5  78.0 - 100.0 fL   MCH 27.1  26.0 - 34.0 pg   MCHC 32.4  30.0 - 36.0 g/dL   RDW 95.6 (*) 21.3 - 08.6 %   Platelets 325  150 - 400 K/uL  BASIC METABOLIC PANEL     Status: Abnormal   Collection Time    10/05/12  5:55 AM      Result Value Range   Sodium 136  135 - 145 mEq/L   Potassium 3.3 (*) 3.5 - 5.1 mEq/L   Chloride 98  96 - 112 mEq/L   CO2 27  19 - 32  mEq/L   Glucose, Bld 115 (*) 70 - 99 mg/dL   BUN 22  6 - 23 mg/dL   Creatinine, Ser 5.78  0.50 - 1.10 mg/dL   Calcium 9.5  8.4 - 46.9 mg/dL   GFR calc non Af Amer 62 (*) >90 mL/min   GFR calc Af Amer 72 (*) >90 mL/min     Studies/Results: @RISRSLT24 @  . antiseptic oral rinse  15 mL Mouth Rinse QID  . aspirin  325 mg Oral Daily  . carvedilol  6.25 mg Oral BID WC  . chlorhexidine  15 mL Mouth Rinse BID  . furosemide  80 mg Intravenous Q12H  . simvastatin  20 mg Oral q1800  . sodium chloride  3 mL Intravenous Q12H     Assessment/Plan: s/p Procedure(s): LAPAROSCOPIC INCISIONAL HERNIA possible open INSERTION OF MESH  POD #9. Laparoscopic ventral hernia repair with mesh. No obvious surgical complications.  Small seroma and hernia sac, periumbilical, should be self-limited. Heart healthy diet.  GI function has normalized  Ambulated Margo Aye  She actually meets discharge criteria, strictly from an abdominal surgery, GI standpoint  Currently transferred  [facility].  Return to see me in office in 2 weeks once she is ultimately discharged  Acute perioperative lateral STEMI., without hemodynamic compromise.Associated acute mixed systolic/diastolic CHF.  On twice a day Lasix IV. No chest pain.  Remains on heparin drip. Cardiac cath planned for Today.  Eventually will need to restart Coumadin   Pulmonary edema, presumably cardiogenic.Marland Kitchen Receiving Lasix and fluid restriction.   Hypokalemia. Not surprising considering high-dose Lasix. Defer stabilization to [facility]ology. .  Nutrition. low-fat, heart healthy diet. Tolerating well.   History pulmonary embolism, on chronic Coumadin As an outpatient.   History of sleep apnea   History hypertension-On Lopressor   On PPI for ulcer prophylaxis  .  Morbid obesity   Followed as an outpatient by Rosana Berger at the [facility] internal medicine clinic   @PROBHOSP @  LOS: 9 days    Darica Goren M. Derrell Lolling, M.D.,  [facility] General and Minimally invasive Surgery Breast and Colorectal Surgery Office:   [phone] Pager:   [phone]  10/06/2012  . .prob

## 2012-10-07 MED ORDER — ASPIRIN 81 MG PO CHEW: 81.0000 mg | CHEWABLE_TABLET | Freq: Every day | ORAL | Status: AC

## 2012-10-07 MED ORDER — LISINOPRIL 20 MG PO TABS: 40.0000 mg | ORAL_TABLET | Freq: Every day | ORAL | Status: DC

## 2012-10-07 MED ORDER — CARVEDILOL 6.25 MG PO TABS: 6.2500 mg | ORAL_TABLET | Freq: Two times a day (BID) | ORAL | Status: DC

## 2012-10-07 MED ORDER — ASPIRIN 81 MG PO CHEW
81.0000 mg | CHEWABLE_TABLET | Freq: Every day | ORAL | Status: DC
  Filled 2012-10-07: qty 1

## 2012-10-07 MED ORDER — POTASSIUM CHLORIDE CRYS ER 10 MEQ PO TBCR: 10.0000 meq | EXTENDED_RELEASE_TABLET | Freq: Every day | ORAL | Status: DC

## 2012-10-07 MED ORDER — POTASSIUM CHLORIDE CRYS ER 10 MEQ PO TBCR
10.0000 meq | EXTENDED_RELEASE_TABLET | Freq: Every day | ORAL | Status: DC
  Filled 2012-10-07: qty 1

## 2012-10-07 MED ORDER — ENOXAPARIN SODIUM 150 MG/ML ~~LOC~~ SOLN
130.0000 mg | Freq: Two times a day (BID) | SUBCUTANEOUS | Status: DC
  Filled 2012-10-07: qty 1

## 2012-10-07 MED ORDER — WARFARIN - PHARMACIST DOSING INPATIENT: Freq: Every day | Status: DC

## 2012-10-07 MED ORDER — FUROSEMIDE 80 MG PO TABS
80.0000 mg | ORAL_TABLET | Freq: Two times a day (BID) | ORAL | Status: DC
  Filled 2012-10-07 (×2): qty 1

## 2012-10-07 MED ORDER — POTASSIUM CHLORIDE CRYS ER 20 MEQ PO TBCR
40.0000 meq | EXTENDED_RELEASE_TABLET | Freq: Once | ORAL | Status: AC
  Administered 2012-10-07: 40 meq via ORAL
  Filled 2012-10-07: qty 2

## 2012-10-07 MED ORDER — FUROSEMIDE 80 MG PO TABS: 80.0000 mg | ORAL_TABLET | Freq: Two times a day (BID) | ORAL | Status: DC

## 2012-10-07 MED ORDER — WARFARIN SODIUM 7.5 MG PO TABS
7.5000 mg | ORAL_TABLET | Freq: Once | ORAL | Status: AC
  Administered 2012-10-07: 7.5 mg via ORAL
  Filled 2012-10-07 (×2): qty 1

## 2012-10-07 MED ORDER — ASPIRIN 81 MG PO CHEW: 81.0000 mg | CHEWABLE_TABLET | Freq: Every day | ORAL | Status: DC

## 2012-10-07 MED ORDER — ENOXAPARIN SODIUM 150 MG/ML ~~LOC~~ SOLN: 120.0000 mg | Freq: Two times a day (BID) | SUBCUTANEOUS | Status: DC

## 2012-10-07 NOTE — Progress Notes (Signed)
Pt ambulated  in hall, no sob reported. O2 sat stayed 91-97%  on room air the all time.

## 2012-10-07 NOTE — Progress Notes (Addendum)
ANTICOAGULATION CONSULT NOTE - Initial Consult  Pharmacy Consult for Warfarin + Lovenox Bridge Indication: Hx PE/DVT  Allergies  Allergen Reactions  . Aspirin     REACTION: Nausea (325mg )    Patient Measurements: Height: 5\' 7"  (170.2 cm) Weight: 296 lb 1.6 oz (134.31 kg) IBW/kg (Calculated) : 61.6  Vital Signs: Temp: 98.2 F (36.8 C) (03/20 0556) Temp src: Oral (03/20 0556) BP: 132/68 mmHg (03/20 0556) Pulse Rate: 92 (03/20 0635)  Labs:  Recent Labs  10/05/12 0555 10/06/12 0454 10/06/12 0620 10/06/12 1748  HGB 10.7* 11.3*  --  11.4*  HCT 33.0* 34.7*  --  34.8*  PLT 325 319  --  361  LABPROT  --  13.7  --   --   INR  --  1.06  --   --   HEPARINUNFRC 0.62 1.04* 1.17* <0.10*  CREATININE 1.01 1.23*  --  1.19*    Estimated Creatinine Clearance: 78.3 ml/min (by C-G formula based on Cr of 1.19).   Medical History: Past Medical History  Diagnosis Date  . Shoulder pain   . Pulmonary embolus 01/16/10    on Coumadin indefinitely  . Abdominal hernia   . Hypertension   . Overweight   . Lung nodule 12/2009    Very small, left upper lobe by CT June, 2011, one-year followup was stable  . Shortness of breath   . Warfarin anticoagulation   . Ejection fraction     65-70%, vigorous function, echo, March, 2011  . CHF (congestive heart failure)   . OSA (obstructive sleep apnea)     CPAP    Assessment: 52 y.o. F anticoagulated on heparin drip for STEMI while awaiting cardiac cath on 3/19. The patient is now s/p cath which revealed minor non-obstructive CAD with good LV function and heparin IV has been d/ced. Pharmacy has now been consulted to resume the patient's home warfarin for hx PE/DVT and start lovenox for bridging.   PTA the patient was known to be taking warfarin 5 mg daily EXCEPT for 10 mg on Wednesdays only.   The patient sees Dr. Alexandria Lodge at the IM warfarin clinic for management.  INR on 3/19 was 1.06. Hgb/Hct/Plt stable. No s/sx of bleeding noted. The patient was  re-educated on warfarin today.   The patient was started on heparin SQ for VTE prophylaxis post cardiac cath on 3/19. The patient's last dose was around 1400 today. Will plan to start Lovenox bridge this evening -- will give full dose of 130 mg/12h while patient in the hospital. Per discussion with Dr. Blake Divine, will plan to reduce to the nearest syringe size of 120 mg/12h upon discharge for ease of administration.   Goal of Therapy:  INR 2-3   Plan:  1. Start Lovenox 130 mg SQ every 12 hours (starting at 2200 this evening) 2. Warfarin 7.5 mg x 1 dose at 1800 today 3. Daily PT/INR 4. Will continue to monitor for any signs/symptoms of bleeding and will follow up with PT/INR in the a.m and monitor renal function for any necessary lovenox dose adjustments  Georgina Pillion, PharmD, BCPS Clinical Pharmacist Pager: [phone] 10/07/2012 12:51 PM

## 2012-10-07 NOTE — Progress Notes (Signed)
1 Day Post-Op  Subjective: Patient doing reasonably well. Tolerating diet. No bowel complaints. GI function has normalized.  Cardiac cath yesterday showed normal EF, No wall motion abnormalities.and no obstructive disease. Vasospastic event theorized..  Objective: Vital signs in last 24 hours: Temp:  [97 F (36.1 C)-98.7 F (37.1 C)] 98.2 F (36.8 C) (03/20 0556) Pulse Rate:  [74-93] 92 (03/20 0635) Resp:  [18-20] 20 (03/20 0556) BP: (98-140)/(58-75) 132/68 mmHg (03/20 0556) SpO2:  [94 %-98 %] 94 % (03/20 0556) Last BM Date: 10/06/12  Intake/Output from previous day:   Intake/Output this shift:    General appearance: alert. Mental status normal. No distress. GI: aabdomen obese. Soft.  Minimally tender. All wounds look fine. Tiny seroma in periumbilical hernia sac. Expect this will be self-limited  Lab Results:  Results for orders placed during the hospital encounter of 09/27/12 (from the past 24 hour(s))  HEPARIN LEVEL (UNFRACTIONATED)     Status: Abnormal   Collection Time    10/06/12  5:48 PM      Result Value Range   Heparin Unfractionated <0.10 (*) 0.30 - 0.70 IU/mL  CBC     Status: Abnormal   Collection Time    10/06/12  5:48 PM      Result Value Range   WBC 8.3  4.0 - 10.5 K/uL   RBC 4.16  3.87 - 5.11 MIL/uL   Hemoglobin 11.4 (*) 12.0 - 15.0 g/dL   HCT 57.8 (*) 46.9 - 62.9 %   MCV 83.7  78.0 - 100.0 fL   MCH 27.4  26.0 - 34.0 pg   MCHC 32.8  30.0 - 36.0 g/dL   RDW 52.8 (*) 41.3 - 24.4 %   Platelets 361  150 - 400 K/uL  CREATININE, SERUM     Status: Abnormal   Collection Time    10/06/12  5:48 PM      Result Value Range   Creatinine, Ser 1.19 (*) 0.50 - 1.10 mg/dL   GFR calc non Af Amer 51 (*) >90 mL/min   GFR calc Af Amer 59 (*) >90 mL/min     Studies/Results: @RISRSLT24 @  . antiseptic oral rinse  15 mL Mouth Rinse QID  . aspirin  325 mg Oral Daily  . carvedilol  6.25 mg Oral BID WC  . chlorhexidine  15 mL Mouth Rinse BID  . furosemide  80 mg  Intravenous Q12H  . heparin  5,000 Units Subcutaneous Q8H  . simvastatin  20 mg Oral q1800  . sodium chloride  3 mL Intravenous Q12H     Assessment/Plan: s/p Procedure(s): LEFT HEART CATHETERIZATION WITH CORONARY ANGIOGRAM   POD #10. Laparoscopic ventral hernia repair with mesh. No obvious surgical complications.  Small seroma in hernia sac, periumbilical, should be self-limited.  Heart healthy diet.  GI function has normalized  She actually meets discharge criteria, strictly from an abdominal surgery, GI standpoint  [facility].  Return to see me in office in 2 weeks once she is ultimately discharged  She was advised no sports and no lifting more than 15 pounds for 6 weeks.  Acute perioperative lateral STEMI.,..Unremarkable cardiac cathet yesterday.  Eventually will need to restart Coumadin   Pulmonary edema, presumably cardiogenic.Marland Kitchen Receiving Lasix and fluid restriction.   Hypokalemia. Not surprising considering high-dose Lasix. Defer stabilization to [facility] and cardiology.  .  Nutrition. low-fat, heart healthy diet. Tolerating well.   History pulmonary embolism, on chronic Coumadin As an outpatient.   History of sleep apnea  History hypertension-On Lopressor  On PPI for ulcer prophylaxis  Morbid obesity  Followed as an outpatient by Rosana Berger at the [facility] internal medicine clinic   @PROBHOSP @  LOS: 10 days    Jahseh Lucchese M. Derrell Lolling, M.D., [facility] General and Minimally invasive Surgery Breast and Colorectal Surgery Office:   [phone] Pager:   [phone]  10/07/2012  . .prob

## 2012-10-07 NOTE — Progress Notes (Signed)
Co pay for lovenox is 2.50. CM made pt aware and syringes are available at [facility] on [address]. Thanks Gala Lewandowsky, RN,BSN [phone]

## 2012-10-07 NOTE — Progress Notes (Signed)
NUTRITION FOLLOW UP  Intervention:    No nutrition intervention warranted at this time RD to follow for nutrition care plan  Nutrition Dx:   Inadequate oral intake, resolved  Goal:   Oral intake with meals to meet >/= 90% of estimated nutrition needs, met  Monitor:   PO intake, weight, labs, I/O's  Assessment:   Patient extubated 3/14.  S/p cardiac cath 3/19.  PO intake at meals mostly  > 80% per flowsheet records.  Height: Ht Readings from Last 1 Encounters:  10/02/12 5\' 7"  (1.702 m)    Weight Status:   Wt Readings from Last 1 Encounters:  10/05/12 296 lb 1.6 oz (134.31 kg)    Body mass index is 46.36 kg/(m^2).  Re-estimated needs:  Kcal: 1700-1900 Protein: 80-90 gm Fluid: 1.7-1.9 L  Skin: abdominal incision   Diet Order: Cardiac   Intake/Output Summary (Last 24 hours) at 10/07/12 1425 Last data filed at 10/07/12 0900  Gross per 24 hour  Intake    240 ml  Output      0 ml  Net    240 ml    Last BM: 3/19  Labs:   Recent Labs Lab 10/01/12 0405  10/02/12 0505 10/05/12 0555 10/06/12 0454 10/06/12 1748  NA 138  < > 141 136 139  --   K 3.5  < > 3.6 3.3* 4.1  --   CL 97  < > 102 98 100  --   CO2 28  < > 28 27 27   --   BUN 25*  < > 33* 22 29*  --   CREATININE 0.95  < > 0.97 1.01 1.23* 1.19*  CALCIUM 9.2  < > 9.6 9.5 10.1  --   MG 2.3  --   --   --   --   --   PHOS 2.9  --   --   --   --   --   GLUCOSE 123*  < > 107* 115* 114*  --   < > = values in this interval not displayed.  CBG (last 3)   Recent Labs  10/04/12 2123  GLUCAP 109*    Scheduled Meds: . antiseptic oral rinse  15 mL Mouth Rinse QID  . [START ON 10/08/2012] aspirin  81 mg Oral Daily  . carvedilol  6.25 mg Oral BID WC  . chlorhexidine  15 mL Mouth Rinse BID  . enoxaparin (LOVENOX) injection  130 mg Subcutaneous Q12H  . furosemide  80 mg Intravenous Q12H  . simvastatin  20 mg Oral q1800  . sodium chloride  3 mL Intravenous Q12H  . warfarin  7.5 mg Oral ONCE-1800  . Warfarin  - Pharmacist Dosing Inpatient   Does not apply q1800    Continuous Infusions: . sodium chloride 10 mL/hr at 10/03/12 0500    Maureen Chatters, RD, LDN Pager #: [phone] After-Hours Pager #: [phone]

## 2012-10-07 NOTE — Discharge Summary (Signed)
Physician Discharge Summary  Breanna Wilkins ZOX:[id number] DOB: 06-08-1961 DOA: 09/27/2012  PCP: Carrolyn Meiers, MD  Admit date: 09/27/2012 Discharge date: 10/07/2012    Recommendations for Outpatient Follow-up:  1. Follow up with surgery as recommended.  2. Follow up at clinic,/ INR CHECK next week.   Discharge Diagnoses:  Principal Problem:   Acute respiratory failure Active Problems:   Warfarin anticoagulation   Pulmonary embolus   Ventral hernia with obstruction   Hypotension, unspecified   Altered mental status   NSTEMI (non-ST elevated myocardial infarction)   Discharge Condition: fair  Diet recommendation: regular diet.   Filed Weights   10/03/12 0000 10/04/12 0459 10/05/12 0541  Weight: 138 kg (304 lb 3.8 oz) 140 kg (308 lb 10.3 oz) 134.31 kg (296 lb 1.6 oz)    History of present illness:  52 year old female who underwent a ventral hernia repair on 3/10. Postop course was complicated by respiratory distress requiring reintubation. Cardiac enzymes were noted to be elevated and she was diagnosed with a perioperative MI with resultant pulmonary edema. A Cardiology consult was requested and a conservative approach was taken with initiation of diuretics, beta blockers, IV heparin and aspirin. An echo revealed severe hypokinesis of the inferior segment and the inferolateral segment of the mid LV along with hypokinesis of the apical aspect of the RV free wall. Moderate LVH was noted as well. She was extubated 3/15, and her consultative care was transferred to the [facility].   Hospital Course:    VDRF:  (A) Pulmonary edema in setting of STEMI  (B) HCAP  ?HCAP  Followup x-rays not convincing of a true infectious infiltrate - the patient is afebrile and her white count has normalized. Antibiotics were discontinued on 3/17. Marland Kitchen  Acute peri-operative lateral wall MI ; an Echo showed reduced LV function  With inferior wall hypokinesis. She later underwent a cardiac  cath on 3/19, which revealed Minor nonobstructive CAD and  Vigorous LV function. It could possibly be coronary vaso spastic event vs stress cardiomyopathy. Follow up with cardiology as needed.    Acute mixed systolic and diastolic CHF  Appears to be compensated. Ambulation oxygen sats are between 93 to 97%.   h/o DVT and PE - warfarin anticoagulation as outpatient  INR sub therapeutic, she was discharged on lovenox to bridge for coumadin.   Ventral hernia with obstruction - S/p laparoscopic ventral hernia repair with mesh  Small seroma in hernia sac, periumbilical, should be self-limited. She was able to tolerate diet and she is being discharged to follow up with surgery in 2 weeks.  Morbid obestiy  Stable. Will need weight loss counseling as outpatient   Obstructive sleep apnea  Continue nightly CPAP   History of HTN  Currently well-controlled     Procedures:  3/10 Ventral Hernia repair  3/10 - 3/15 intubated >> extubated.  Antibiotics:  Ancef 3/10- 3/11  Cefepime 3/13 >> 3/17  Vancomycin 3/13>> 3/17   Consultations: [facility]  Surgery   Discharge Exam: Filed Vitals:   10/06/12 1655 10/06/12 2028 10/07/12 0556 10/07/12 0635  BP: 113/58 98/65 132/68   Pulse: 78 93 90 92  Temp: 98 F (36.7 C) 98.7 F (37.1 C) 98.2 F (36.8 C)   TempSrc: Oral Oral Oral   Resp: 20 18 20    Height:      Weight:      SpO2: 96% 96% 94%    Alert afebrile comfortable.  General: No acute respiratory distress at rest  Lungs: Crackles at  the bases without wheezing.  Cardiovascular: Regular rate and rhythm without murmur gallop or rub normal S1 and S 2. Abdomen: Obese, nondistended, soft, bowel sounds positive, no rebound, no ascites, no appreciable mass  Extremities: No significant cyanosis or clubbing, Improving bilateral lower extremity edema. Now about 1+.    Discharge Instructions  Discharge Orders   Future Appointments Provider Department Dept Phone   10/11/2012 9:45 AM  Bronson Curb, MD [facility] [phone]   10/11/2012 11:00 AM [facility] [place] [facility] [phone]   10/14/2012 8:45 AM Ernestene Mention, MD [facility] [place] [phone]   Future Orders Complete By Expires     (HEART FAILURE PATIENTS) Call MD:  Anytime you have any of the following symptoms: 1) 3 pound weight gain in 24 hours or 5 pounds in 1 week 2) shortness of breath, with or without a dry hacking cough 3) swelling in the hands, feet or stomach 4) if you have to sleep on extra pillows at night in order to breathe.  As directed     Activity as tolerated - No restrictions  As directed     Call MD for:  persistant dizziness or light-headedness  As directed     Call MD for:  redness, tenderness, or signs of infection (pain, swelling, redness, odor or green/yellow discharge around incision site)  As directed     Diet - low sodium heart healthy  As directed     Discharge instructions  As directed     Comments:      FOLLOW up with outpatient clinic at [facility] for INR check on Monday. Dose coumadin as per recommendations.  Please obtain bmp to check potassium and creatinine on Monday and resume lisinopril as per PCP. Follow up with surgery as recommended.        Medication List    TAKE these medications       aspirin 81 MG chewable tablet  Chew 1 tablet (81 mg total) by mouth daily.  Start taking on:  10/08/2012     carvedilol 6.25 MG tablet  Commonly known as:  COREG  Take 1 tablet (6.25 mg total) by mouth 2 (two) times daily with a meal.     enoxaparin 150 MG/ML injection  Commonly known as:  LOVENOX  Inject 0.8 mLs (120 mg total) into the skin every 12 (twelve) hours.     furosemide 80 MG tablet  Commonly known as:  LASIX  Take 1 tablet (80 mg total) by mouth 2 (two) times daily.     HYDROcodone-acetaminophen 5-325 MG per tablet  Commonly known as:  NORCO/VICODIN  Take 1 tablet by mouth every 6 (six)  hours as needed for pain.     lisinopril 20 MG tablet  Commonly known as:  PRINIVIL,ZESTRIL  Take 2 tablets (40 mg total) by mouth daily.     pravastatin 40 MG tablet  Commonly known as:  PRAVACHOL  Take 20 mg by mouth at bedtime.     warfarin 10 MG tablet  Commonly known as:  COUMADIN  Take 5-10 mg by mouth daily. 5mg  daily except 10mg  on wednesdays           Follow-up Information   Follow up with Ernestene Mention, MD. Schedule an appointment as soon as possible for a visit in 2 weeks.   Contact information:   [address] [place] [address] [phone]       Follow up with  HO,MICHELE, MD On 10/11/2012. (@ 9:45 post f/u plan for PT/INR check )    Contact information:   MD HO may not be available that am and you will see a different MD that day. After f/u appointment you will do PT/INR checks.  [address] [place] [address] [phone]       Follow up with [facility]). (As needed)    Contact information:   [address], [address] [place] [address] [phone]       The results of significant diagnostics from this hospitalization (including imaging, microbiology, ancillary and laboratory) are listed below for reference.    Significant Diagnostic Studies: Chest 2 View  09/20/2012  *RADIOLOGY REPORT*  Clinical Data: Preop for hernia repair  CHEST - 2 VIEW  Comparison: 10/17/2010  Findings: Cardiomediastinal silhouette is stable.  No acute infiltrate or pleural effusion.  No pulmonary edema.  Mild degenerative changes thoracic spine.  IMPRESSION:  No active disease.  Mild degenerative changes thoracic spine.   Original Report Authenticated By: Natasha Mead, M.D.    Dg Chest Port 1 View  10/02/2012  *RADIOLOGY REPORT*  Clinical Data: Infiltrates  PORTABLE CHEST - 1 VIEW  Comparison: One-view chest 10/01/2012  Findings: Cardiac enlargement is stable.  Mild interstitial edema is present.  Aeration continues to  improve.  The lung volumes are low.  Minimal bibasilar atelectasis is evident. The endotracheal tube and NG tube have been removed.  IMPRESSION:  1.  Slight improved aeration. 2.  Persistent cardiomegaly and mild edema. 3.  Interval extubation.   Original Report Authenticated By: Marin Roberts, M.D.    Dg Chest Port 1 View  10/01/2012  *RADIOLOGY REPORT*  Clinical Data: Bilateral pulmonary infiltrates.  PORTABLE CHEST - 1 VIEW  Comparison: 09/30/2012  Findings: Endotracheal tube and NG tube are in good position. There has been significant improvement in the extensive bilateral pulmonary infiltrates.  No effusions.  Heart size and vascularity are normal.  IMPRESSION: Significant improvement in the bilateral pulmonary infiltrates.   Original Report Authenticated By: Francene Boyers, M.D.    Dg Chest Port 1 View  09/30/2012  *RADIOLOGY REPORT*  Clinical Data: Evaluate endotracheal tube positioning  PORTABLE CHEST - 1 VIEW  Comparison: 09/29/2012; 09/28/2012  Findings: Grossly unchanged large cardiac silhouette and mediastinal contours.  Stable position support apparatus.  No pneumothorax.  Lung volumes remain persistently reduced.  Pulmonary vasculature is indistinct.  Grossly unchanged perihilar and bibasilar heterogeneous / consolidative opacities, left greater than right.  No definite pleural effusion.  Unchanged bones.  IMPRESSION: 1.  Stable positioning of support apparatus.  No pneumothorax. 2.  Grossly unchanged findings of pulmonary edema and bilateral air space opacities, left greater than right, atelectasis versus infiltrate.   Original Report Authenticated By: Tacey Ruiz, MD    Dg Chest Port 1 View  09/29/2012  *RADIOLOGY REPORT*  Clinical Data: Pulmonary edema.  Fever.  PORTABLE CHEST - 1 VIEW  Comparison: 03/11 and 09/27/2012  Findings: Endotracheal tube is in good position.  Tip of the NG tube is below the diaphragm. Slight improvement in the bilateral pulmonary edema.  No effusions.   IMPRESSION: Slight improvement in pulmonary edema.   Original Report Authenticated By: Francene Boyers, M.D.    Dg Chest Port 1 View  09/28/2012  *RADIOLOGY REPORT*  Clinical Data: Evaluate endotracheal tube position.  PORTABLE CHEST - 1 VIEW  Comparison: Chest x-ray of 09/27/2012.  Findings: An endotracheal tube is in place with tip 3.9  cm above the carina. A nasogastric tube is seen extending into the stomach, however, the tip of the nasogastric tube extends below the lower margin of the image.  Lung volumes are low.  Widespread interstitial and airspace disease throughout all aspects of the lungs bilaterally, with perihilar predominance, concerning for moderate to severe pulmonary edema.  Additional superimposed atelectasis in the left lower lobe.  Possible small left pleural effusion.  Mild cardiomegaly is unchanged. The patient is rotated to the left on today's exam, resulting in distortion of the mediastinal contours and reduced diagnostic sensitivity and specificity for mediastinal pathology.  IMPRESSION: 1.  Support apparatus, as above. 2.  Findings, as above, concerning for congestive heart failure, possibly with small left pleural effusion and associated atelectasis in the left lower lobe.   Original Report Authenticated By: Trudie Reed, M.D.    Dg Chest Port 1 View  09/27/2012  *RADIOLOGY REPORT*  Clinical Data: Respiratory difficulty  PORTABLE CHEST - 1 VIEW  Comparison: 09/20/2012  Findings: Mild cardiomegaly.  Endotracheal tube placed.  Tip is 5.1 cm from the carina.  Central airspace disease and vascular congestion.  Left basilar pulmonary opacity.  Gastric distention. No pneumothorax.  IMPRESSION: Central airspace disease and vascular congestion compatible with pulmonary edema.  Left basilar consolidation likely a combination of edema and volume loss.  Gastric distention.  The patient may benefit from an NG tube.   Original Report Authenticated By: Jolaine Click, M.D.      Microbiology: Recent Results (from the past 240 hour(s))  URINE CULTURE     Status: None   Collection Time    09/27/12  6:59 PM      Result Value Range Status   Specimen Description URINE, CATHETERIZED   Final   Special Requests NONE   Final   Culture  Setup Time 09/28/2012 03:06   Final   Colony Count NO GROWTH   Final   Culture NO GROWTH   Final   Report Status 09/28/2012 FINAL   Final  MRSA PCR SCREENING     Status: None   Collection Time    09/27/12  7:03 PM      Result Value Range Status   MRSA by PCR NEGATIVE  NEGATIVE Final   Comment:            The GeneXpert MRSA Assay (FDA     approved for NASAL specimens     only), is one component of a     comprehensive MRSA colonization     surveillance program. It is not     intended to diagnose MRSA     infection nor to guide or     monitor treatment for     MRSA infections.  URINE CULTURE     Status: None   Collection Time    09/30/12  6:42 AM      Result Value Range Status   Specimen Description URINE, CATHETERIZED   Final   Special Requests Normal   Final   Culture  Setup Time 09/30/2012 07:06   Final   Colony Count NO GROWTH   Final   Culture NO GROWTH   Final   Report Status 10/01/2012 FINAL   Final  CULTURE, BLOOD (ROUTINE X 2)     Status: None   Collection Time    09/30/12 10:46 AM      Result Value Range Status   Specimen Description BLOOD ARM RIGHT   Final   Special Requests BOTTLES DRAWN AEROBIC AND ANAEROBIC 10CC  Final   Culture  Setup Time 09/30/2012 17:19   Final   Culture NO GROWTH 5 DAYS   Final   Report Status 10/06/2012 FINAL   Final  CULTURE, BLOOD (ROUTINE X 2)     Status: None   Collection Time    09/30/12 10:50 AM      Result Value Range Status   Specimen Description BLOOD RIGHT ANTECUBITAL   Final   Special Requests BOTTLES DRAWN AEROBIC AND ANAEROBIC 10CC   Final   Culture  Setup Time 09/30/2012 17:19   Final   Culture NO GROWTH 5 DAYS   Final   Report Status 10/06/2012 FINAL   Final      Labs: Basic Metabolic Panel:  Recent Labs Lab 10/01/12 0405 10/01/12 2209 10/02/12 0505 10/05/12 0555 10/06/12 0454 10/06/12 1748  NA 138 141 141 136 139  --   K 3.5 3.8 3.6 3.3* 4.1  --   CL 97 101 102 98 100  --   CO2 28 25 28 27 27   --   GLUCOSE 123* 111* 107* 115* 114*  --   BUN 25* 34* 33* 22 29*  --   CREATININE 0.95 0.99 0.97 1.01 1.23* 1.19*  CALCIUM 9.2 9.6 9.6 9.5 10.1  --   MG 2.3  --   --   --   --   --   PHOS 2.9  --   --   --   --   --    Liver Function Tests: No results found for this basename: AST, ALT, ALKPHOS, BILITOT, PROT, ALBUMIN,  in the last 168 hours No results found for this basename: LIPASE, AMYLASE,  in the last 168 hours No results found for this basename: AMMONIA,  in the last 168 hours CBC:  Recent Labs Lab 10/03/12 0435 10/04/12 0455 10/05/12 0555 10/06/12 0454 10/06/12 1748  WBC 9.8 10.0 10.2 10.1 8.3  HGB 11.3* 10.8* 10.7* 11.3* 11.4*  HCT 34.5* 33.4* 33.0* 34.7* 34.8*  MCV 84.8 82.9 83.5 81.8 83.7  PLT 286 283 325 319 361   Cardiac Enzymes: No results found for this basename: CKTOTAL, CKMB, CKMBINDEX, TROPONINI,  in the last 168 hours BNP: BNP (last 3 results)  Recent Labs  01/20/12 1621 09/30/12 0345 10/01/12 0405  PROBNP 25.9 2211.0* 964.6*   CBG:  Recent Labs Lab 10/01/12 1505 10/04/12 2123  GLUCAP 104* 109*       Signed:  Charlton Boule  [facility] 10/07/2012, 2:38 PM

## 2012-10-07 NOTE — Progress Notes (Addendum)
Patient Name: Breanna Wilkins Date of Encounter: 10/07/2012     Principal Problem:   Acute respiratory failure Active Problems:   Warfarin anticoagulation   Pulmonary embolus   Ventral hernia with obstruction   Hypotension, unspecified   Altered mental status   NSTEMI (non-ST elevated myocardial infarction)    SUBJECTIVE  Stable post cath.  See cath note.    CURRENT MEDS . antiseptic oral rinse  15 mL Mouth Rinse QID  . aspirin  325 mg Oral Daily  . carvedilol  6.25 mg Oral BID WC  . chlorhexidine  15 mL Mouth Rinse BID  . furosemide  80 mg Intravenous Q12H  . heparin  5,000 Units Subcutaneous Q8H  . simvastatin  20 mg Oral q1800  . sodium chloride  3 mL Intravenous Q12H    OBJECTIVE  Filed Vitals:   10/06/12 1655 10/06/12 2028 10/07/12 0556 10/07/12 0635  BP: 113/58 98/65 132/68   Pulse: 78 93 90 92  Temp: 98 F (36.7 C) 98.7 F (37.1 C) 98.2 F (36.8 C)   TempSrc: Oral Oral Oral   Resp: 20 18 20    Height:      Weight:      SpO2: 96% 96% 94%    No intake or output data in the 24 hours ending 10/07/12 0840 Filed Weights   10/03/12 0000 10/04/12 0459 10/05/12 0541  Weight: 304 lb 3.8 oz (138 kg) 308 lb 10.3 oz (140 kg) 296 lb 1.6 oz (134.31 kg)    PHYSICAL EXAM  General: Pleasant, NAD.  Morbidly obese Neuro: Alert and oriented X 3. Moves all extremities spontaneously. Psych: Normal affect. HEENT:  Normal  Neck: Supple without bruits or JVD. Lungs:  Resp regular and unlabored, anteriorly clears.   Heart: RRR no s3, s4, or murmurs. Abdomen: Soft, non-tender, non-distended, BS + x 4.  Extremities: No clubbing, cyanosis or edema. DP/PT/Radials 2+ and equal bilaterally.  Accessory Clinical Findings  CBC  Recent Labs  10/06/12 0454 10/06/12 1748  WBC 10.1 8.3  HGB 11.3* 11.4*  HCT 34.7* 34.8*  MCV 81.8 83.7  PLT 319 361   Basic Metabolic Panel  Recent Labs  10/05/12 0555 10/06/12 0454 10/06/12 1748  NA 136 139  --   K 3.3* 4.1  --     CL 98 100  --   CO2 27 27  --   GLUCOSE 115* 114*  --   BUN 22 29*  --   CREATININE 1.01 1.23* 1.19*  CALCIUM 9.5 10.1  --    Liver Function Tests No results found for this basename: AST, ALT, ALKPHOS, BILITOT, PROT, ALBUMIN,  in the last 72 hours No results found for this basename: LIPASE, AMYLASE,  in the last 72 hours Cardiac Enzymes No results found for this basename: CKTOTAL, CKMB, CKMBINDEX, TROPONINI,  in the last 72 hours BNP No components found with this basename: POCBNP,  D-Dimer No results found for this basename: DDIMER,  in the last 72 hours Hemoglobin A1C No results found for this basename: HGBA1C,  in the last 72 hours Fasting Lipid Panel No results found for this basename: CHOL, HDL, LDLCALC, TRIG, CHOLHDL, LDLDIRECT,  in the last 72 hours Thyroid Function Tests No results found for this basename: TSH, T4TOTAL, FREET3, T3FREE, THYROIDAB,  in the last 72 hours  TELE  NSR.  Rate controlled.    ECG  Not done since the 10th  Radiology/Studies  Chest 2 View  09/20/2012  *RADIOLOGY REPORT*  Clinical Data: Preop  for hernia repair  CHEST - 2 VIEW  Comparison: 10/17/2010  Findings: Cardiomediastinal silhouette is stable.  No acute infiltrate or pleural effusion.  No pulmonary edema.  Mild degenerative changes thoracic spine.  IMPRESSION:  No active disease.  Mild degenerative changes thoracic spine.   Original Report Authenticated By: Natasha Mead, M.D.    Dg Chest Port 1 View  10/02/2012  *RADIOLOGY REPORT*  Clinical Data: Infiltrates  PORTABLE CHEST - 1 VIEW  Comparison: One-view chest 10/01/2012  Findings: Cardiac enlargement is stable.  Mild interstitial edema is present.  Aeration continues to improve.  The lung volumes are low.  Minimal bibasilar atelectasis is evident. The endotracheal tube and NG tube have been removed.  IMPRESSION:  1.  Slight improved aeration. 2.  Persistent cardiomegaly and mild edema. 3.  Interval extubation.   Original Report Authenticated By:  Marin Roberts, M.D.    Dg Chest Port 1 View  10/01/2012  *RADIOLOGY REPORT*  Clinical Data: Bilateral pulmonary infiltrates.  PORTABLE CHEST - 1 VIEW  Comparison: 09/30/2012  Findings: Endotracheal tube and NG tube are in good position. There has been significant improvement in the extensive bilateral pulmonary infiltrates.  No effusions.  Heart size and vascularity are normal.  IMPRESSION: Significant improvement in the bilateral pulmonary infiltrates.   Original Report Authenticated By: Francene Boyers, M.D.    Dg Chest Port 1 View  09/30/2012  *RADIOLOGY REPORT*  Clinical Data: Evaluate endotracheal tube positioning  PORTABLE CHEST - 1 VIEW  Comparison: 09/29/2012; 09/28/2012  Findings: Grossly unchanged large cardiac silhouette and mediastinal contours.  Stable position support apparatus.  No pneumothorax.  Lung volumes remain persistently reduced.  Pulmonary vasculature is indistinct.  Grossly unchanged perihilar and bibasilar heterogeneous / consolidative opacities, left greater than right.  No definite pleural effusion.  Unchanged bones.  IMPRESSION: 1.  Stable positioning of support apparatus.  No pneumothorax. 2.  Grossly unchanged findings of pulmonary edema and bilateral air space opacities, left greater than right, atelectasis versus infiltrate.   Original Report Authenticated By: Tacey Ruiz, MD    Dg Chest Port 1 View  09/29/2012  *RADIOLOGY REPORT*  Clinical Data: Pulmonary edema.  Fever.  PORTABLE CHEST - 1 VIEW  Comparison: 03/11 and 09/27/2012  Findings: Endotracheal tube is in good position.  Tip of the NG tube is below the diaphragm. Slight improvement in the bilateral pulmonary edema.  No effusions.  IMPRESSION: Slight improvement in pulmonary edema.   Original Report Authenticated By: Francene Boyers, M.D.    Dg Chest Port 1 View  09/28/2012  *RADIOLOGY REPORT*  Clinical Data: Evaluate endotracheal tube position.  PORTABLE CHEST - 1 VIEW  Comparison: Chest x-ray of 09/27/2012.   Findings: An endotracheal tube is in place with tip 3.9 cm above the carina. A nasogastric tube is seen extending into the stomach, however, the tip of the nasogastric tube extends below the lower margin of the image.  Lung volumes are low.  Widespread interstitial and airspace disease throughout all aspects of the lungs bilaterally, with perihilar predominance, concerning for moderate to severe pulmonary edema.  Additional superimposed atelectasis in the left lower lobe.  Possible small left pleural effusion.  Mild cardiomegaly is unchanged. The patient is rotated to the left on today's exam, resulting in distortion of the mediastinal contours and reduced diagnostic sensitivity and specificity for mediastinal pathology.  IMPRESSION: 1.  Support apparatus, as above. 2.  Findings, as above, concerning for congestive heart failure, possibly with small left pleural effusion and associated atelectasis in  the left lower lobe.   Original Report Authenticated By: Trudie Reed, M.D.    Dg Chest Port 1 View  09/27/2012  *RADIOLOGY REPORT*  Clinical Data: Respiratory difficulty  PORTABLE CHEST - 1 VIEW  Comparison: 09/20/2012  Findings: Mild cardiomegaly.  Endotracheal tube placed.  Tip is 5.1 cm from the carina.  Central airspace disease and vascular congestion.  Left basilar pulmonary opacity.  Gastric distention. No pneumothorax.  IMPRESSION: Central airspace disease and vascular congestion compatible with pulmonary edema.  Left basilar consolidation likely a combination of edema and volume loss.  Gastric distention.  The patient may benefit from an NG tube.   Original Report Authenticated By: Jolaine Click, M.D.     ASSESSMENT AND PLAN  1.  MI post surgical procedures without culprit 2.  Hypokalemia  Rec:  1.  Ambulate 2  Needs to be off )2 3.  Recheck ECG 4.  Replace K.  Note:  Says ASA is an allergy, but on 325mg .  Not clear.  If used, can use 81mg  per day.    Signed, Shawnie Pons MD, [facility],  FSCAI

## 2012-10-11 ENCOUNTER — Ambulatory Visit (INDEPENDENT_AMBULATORY_CARE_PROVIDER_SITE_OTHER): Payer: Medicare Other | Admitting: Internal Medicine

## 2012-10-11 ENCOUNTER — Encounter: Payer: Self-pay | Admitting: Internal Medicine

## 2012-10-11 ENCOUNTER — Ambulatory Visit (INDEPENDENT_AMBULATORY_CARE_PROVIDER_SITE_OTHER): Payer: Medicare Other | Admitting: Pharmacist

## 2012-10-11 VITALS — BP 137/78 | HR 74 | Temp 97.0°F | Ht 67.0 in | Wt 296.9 lb

## 2012-10-11 DIAGNOSIS — K436 Other and unspecified ventral hernia with obstruction, without gangrene: Secondary | ICD-10-CM

## 2012-10-11 DIAGNOSIS — Z7901 Long term (current) use of anticoagulants: Secondary | ICD-10-CM

## 2012-10-11 DIAGNOSIS — I213 ST elevation (STEMI) myocardial infarction of unspecified site: Secondary | ICD-10-CM

## 2012-10-11 DIAGNOSIS — I10 Essential (primary) hypertension: Secondary | ICD-10-CM

## 2012-10-11 DIAGNOSIS — I509 Heart failure, unspecified: Secondary | ICD-10-CM | POA: Insufficient documentation

## 2012-10-11 DIAGNOSIS — I219 Acute myocardial infarction, unspecified: Secondary | ICD-10-CM

## 2012-10-11 DIAGNOSIS — E785 Hyperlipidemia, unspecified: Secondary | ICD-10-CM

## 2012-10-11 LAB — BASIC METABOLIC PANEL WITH GFR
Chloride: 96 mEq/L (ref 96–112)
GFR, Est African American: 56 mL/min — ABNORMAL LOW
GFR, Est Non African American: 48 mL/min — ABNORMAL LOW
Potassium: 4.3 mEq/L (ref 3.5–5.3)
Sodium: 137 mEq/L (ref 135–145)

## 2012-10-11 LAB — POCT INR: INR: 1

## 2012-10-11 MED ORDER — HYDROCODONE-ACETAMINOPHEN 5-325 MG PO TABS: 1.0000 | ORAL_TABLET | Freq: Four times a day (QID) | ORAL | Status: DC | PRN

## 2012-10-11 NOTE — Assessment & Plan Note (Signed)
Patient with perioperative STEMI complicated by pulmonary edema and resp failure requiring intubation. Chest pain free today. On BB, ASA. Holding ACE-i pending BMet for renal function. - Patient was told to call [place] cards on d/c to make f/u appt. Provided phone # for her again today, instructed her to call. - Will call after labs w instructions on resuming ACE-i

## 2012-10-11 NOTE — Assessment & Plan Note (Signed)
Patient unable to tolerate high dose statin due to muscle pain. Instructed her to continue pravastatin 40mg  daily.

## 2012-10-11 NOTE — Patient Instructions (Signed)
Patient instructed to take medications as defined in the Anti-coagulation Track section of this encounter.  Patient instructed to take today's dose.  Patient verbalized understanding of these instructions.  Patient will CONTINUE Lovenox injections 120mg  SQ q12h. Ten syringes provided.

## 2012-10-11 NOTE — Assessment & Plan Note (Addendum)
S/P mesh repair. Incision sites healing well. Pt w normal BM. Still with some post-op pain. Has f/u w surgery 10/14/12. - refilled hydrocodone x1

## 2012-10-11 NOTE — Assessment & Plan Note (Addendum)
2/2 STEMI. Patient noted to have decreased EF and inferolateral wall hypokinesis on Echo 09/28/12 while in hospital. Discharged on lasix 80mg  BID. Appears euvolemic today. No s/s HF exac. Will titrate lasix down to 80mg  qam, 40 q pm. Check Bmet today for renal function, potassium.  ADDENDUM Lab Results  Component Value Date   CREATININE 1.27* 10/11/2012   Bmet shows persistently elevated Cr, probably related to diuresis. Lasix was decreased during visit as described above. Patient to return to clinic 10/19/12 for follow-up labs. I called her and instructed her to continue holding lisinopril until her appointment 10/19/12. She is in agreement.

## 2012-10-11 NOTE — Progress Notes (Signed)
Patient ID: SHANDREKA DANTE, female   DOB: 1960-07-23, 52 y.o.   MRN: [id number]  Subjective:   Patient ID: NETHRA MEHLBERG female   DOB: [birth year] 52 y.o.   MRN: [id number]  HPI: Ms.Lisset D Styron is a 52 y.o. female with history of PE on chronic anticoagulation, CHF, HTN presenting for hospital follow-up.  Earlier this month, she underwent incarcerated ventral hernia repair and lysis of adhesions. Surgery was complicated by perioperative inferiolateral STEMI and acute respiratory failure requiring ventilator support 2/2 pulmonary edema. Echo demonstrated inferolateral hypokinesis and EF 50-55%, cath was done which showed minor nonobstructive CAD.  She was discharged on ASA, carvedilol BID, Lasix 80mg  BID, and pravastatin 40mg . Today, she reports that she has not taken lisinopril, pravastatin or warfarin since discharge 4 days ago.  Says she feels well with no CP, SOB, PND, LE edema. Having normal BM post op with no blood in stool or straining. Denies N/V/D, abd pain, fever or chills. Laparoscopic sites are healing well. Comes in with roommate who helps her take her medications.  She has a f/u appt w surgery (Dr. Derrell Lolling) on 10/14/12.  She has an appt this morning with Dr Alexandria Lodge to check her INR and adjust warfarin.    Past Medical History  Diagnosis Date  . Shoulder pain   . Pulmonary embolus 01/16/10    on Coumadin indefinitely  . Abdominal hernia   . Hypertension   . Overweight   . Lung nodule 12/2009    Very small, left upper lobe by CT June, 2011, one-year followup was stable  . Shortness of breath   . Warfarin anticoagulation   . Ejection fraction     65-70%, vigorous function, echo, March, 2011  . CHF (congestive heart failure)   . OSA (obstructive sleep apnea)     CPAP   Current Outpatient Prescriptions  Medication Sig Dispense Refill  . aspirin 81 MG chewable tablet Chew 1 tablet (81 mg total) by mouth daily.  30 tablet  1  . carvedilol (COREG) 6.25 MG tablet Take 1  tablet (6.25 mg total) by mouth 2 (two) times daily with a meal.  60 tablet  0  . enoxaparin (LOVENOX) 150 MG/ML injection Inject 0.8 mLs (120 mg total) into the skin every 12 (twelve) hours.  5 Syringe  0  . furosemide (LASIX) 80 MG tablet Take 1 tablet (80 mg total) by mouth 2 (two) times daily.  60 tablet  0  . HYDROcodone-acetaminophen (NORCO/VICODIN) 5-325 MG per tablet Take 1 tablet by mouth every 6 (six) hours as needed for pain.  30 tablet  0  . lisinopril (PRINIVIL,ZESTRIL) 20 MG tablet Take 2 tablets (40 mg total) by mouth daily.      . potassium chloride (K-DUR,KLOR-CON) 10 MEQ tablet Take 1 tablet (10 mEq total) by mouth daily.  30 tablet  0  . pravastatin (PRAVACHOL) 40 MG tablet Take 20 mg by mouth at bedtime.      Marland Kitchen warfarin (COUMADIN) 10 MG tablet Take 5-10 mg by mouth daily. 5mg  daily except 10mg  on wednesdays       No current facility-administered medications for this visit.   Family History  Problem Relation Age of Onset  . Diabetes Father   . Diabetes Sister   . Diabetes Mother   . Deep vein thrombosis Sister   . Ovarian cancer Mother   . Bone cancer Maternal Grandmother    History   Social History  . Marital Status: Single    Spouse  Name: N/A    Number of Children: N/A  . Years of Education: N/A   Social History Main Topics  . Smoking status: Former Games developer  . Smokeless tobacco: Not on file     Comment: qoit 12 years ago  . Alcohol Use: 3.6 oz/week    0 Drinks containing 0.5 oz of alcohol, 6 Cans of beer per week     Comment: 6 pack of beer  . Drug Use: No     Comment: smoked joint 2 weeks ago  . Sexually Active: Not on file   Other Topics Concern  . Not on file   Social History Narrative   Financial assistance approved for 100% discount at [facility] and has [facility] card per Rudell Cobb   12/25/2009   Review of Systems: 10 pt ROS performed, pertinent positives and negatives noted in HPI Objective:  Physical Exam: Filed Vitals:   10/11/12 1006  BP: 137/78   Pulse: 74  Temp: 97 F (36.1 C)  TempSrc: Oral  Height: 5\' 7"  (1.702 m)  Weight: 296 lb 14.4 oz (134.673 kg)  SpO2: 100%   Constitutional: Vital signs reviewed.  Patient is an obese in no acute distress and cooperative with exam. Alert and oriented x3.  Head: Normocephalic and atraumatic Mouth: Moist. No erythema or exudates, MMM Eyes: PERRL, EOMI, conjunctivae normal, No scleral icterus.  Neck: Supple, no masses Cardiovascular: RRR, S1 normal, S2 normal, no MRG, pulses symmetric and intact bilaterally Pulmonary/Chest: CTAB, no rales Abdominal: Multiple laparoscopic incision sites are c/d/i. Abdomen mildly TTP. Normoactive BS. Neurological: A&O x3, Strength is normal and symmetric bilaterally, cranial nerve II-XII are grossly intact, no focal motor deficit, sensory intact to light touch bilaterally.  Ext: no edema Psychiatric: Normal mood and affect. Assessment & Plan:   Please see problem-based charting for assessment and plan.

## 2012-10-11 NOTE — Assessment & Plan Note (Signed)
Has appt w Dr. Alexandria Lodge today for INR check and coumadin adjustment. Patient says she has not been taking coumadin for the past 4 days since hospital discharge. - F/U INR, may need to be bridged

## 2012-10-11 NOTE — Progress Notes (Signed)
Anti-Coagulation Progress Note  Breanna Wilkins is a 52 y.o. female who is currently on an anti-coagulation regimen.    RECENT RESULTS: Recent results are below, the most recent result is correlated with a dose of ZERO mg. per week: She indicates that it was not been taking concomitant warfarin with her LMWH--ONLY LMWH. We recommence a "bridge" with 120mg  SQ q12 PLUS concomitant warfarin therapy and re-evaluate INR response in one week. Lab Results  Component Value Date   INR 1.0 10/11/2012   INR 1.06 10/06/2012   INR 1.07 09/27/2012    ANTI-COAG DOSE: Anticoagulation Dose Instructions as of 10/11/2012     [name] Tue Wed Thu Fri Sat   New Dose 5 mg 5 mg 5 mg 10 mg 5 mg 5 mg 5 mg    Description       Will continue with LMWH BRIDGE THERAPY 120mg  SQ q12h until next INR.       ANTICOAG SUMMARY: Anticoagulation Episode Summary   Current INR goal 2.0-3.0   Next INR check 10/18/2012   INR from last check 1.0! (10/11/2012)   Weekly max dose    Target end date    INR check location [facility]   Preferred lab    Send INR reminders to       Comments         ANTICOAG TODAY: Anticoagulation Summary as of 10/11/2012   INR goal 2.0-3.0   Selected INR 1.0! (10/11/2012)   Next INR check 10/18/2012   Target end date     Anticoagulation Episode Summary   INR check location [facility]   Preferred lab    Send INR reminders to    Comments       PATIENT INSTRUCTIONS: Patient Instructions  Patient instructed to take medications as defined in the Anti-coagulation Track section of this encounter.  Patient instructed to take today's dose.  Patient verbalized understanding of these instructions.  Patient will CONTINUE Lovenox injections 120mg  SQ q12h. Ten syringes provided.     FOLLOW-UP Return in 7 days (on 10/18/2012) for Follow up INR at 1000h.  Hulen Luster, III Pharm.D., CACP

## 2012-10-11 NOTE — Assessment & Plan Note (Addendum)
BP Readings from Last 3 Encounters:  10/11/12 137/78  10/07/12 116/60  10/07/12 116/60    Lab Results  Component Value Date   NA 139 10/06/2012   K 4.1 10/06/2012   CREATININE 1.19* 10/06/2012    Assessment:  Blood pressure control:  controlled  Progress toward BP goal:   at goal   Plan:  Medications:  continue current medications    ADDENDUM Lab Results  Component Value Date   CREATININE 1.27* 10/11/2012   Bmet shows persisently elevated Cr, probably related to diuresis. Lasix was decreased during visit yesterday. Patient to return to clinic 10/19/12 for follow-up labs. I called her and instructed her to continue holding lisinopril until her appointment 10/19/12. She is in agreement.

## 2012-10-11 NOTE — Patient Instructions (Addendum)
1. For your Lasix (furosemide): Take 80mg  in the morning and 40mg  in the evening ( 1 tablet in the morning, one-half tablet in the evening). 2. I will call you about starting back lisinopril. Hold off for now.  3. Please call the heart doctors to make an appointment for follow-up [phone]. 4. Please take your warfarin as Dr. Alexandria Lodge says.

## 2012-10-14 ENCOUNTER — Ambulatory Visit (INDEPENDENT_AMBULATORY_CARE_PROVIDER_SITE_OTHER): Payer: Medicare Other | Admitting: General Surgery

## 2012-10-14 ENCOUNTER — Encounter (INDEPENDENT_AMBULATORY_CARE_PROVIDER_SITE_OTHER): Payer: Self-pay | Admitting: General Surgery

## 2012-10-14 VITALS — BP 124/82 | HR 84 | Temp 97.6°F | Resp 18 | Ht 67.0 in | Wt 291.0 lb

## 2012-10-14 DIAGNOSIS — K436 Other and unspecified ventral hernia with obstruction, without gangrene: Secondary | ICD-10-CM

## 2012-10-14 NOTE — Patient Instructions (Signed)
You are recovering from your laparoscopic ventral hernia repair with mesh without any obvious surgical complications.  You have a small fluid collection, or seroma in the hernia sac which will hopefully resolve.  You may drive your car. No heavy lifting or sports for 6 weeks  Return to see Dr. Derrell Lolling in 6 weeks for a wound check.  Make an appointment to see Dr. Myrtis Ser in the [facility] cardiology clinic.  Be sure to keep your appointment in the Coumadin clinic.

## 2012-10-14 NOTE — Progress Notes (Signed)
Patient ID: Breanna Wilkins, female   DOB: Oct 04, 1960, 52 y.o.   MRN: [id number] History: This patient underwent elective laparoscopic lysis of adhesions and laparoscopic ventral hernia repair with mesh on 09/27/2012. She went into pulmonary edema in the recovery room, was reintubated, and was found to have a small lateral myocardial infarction. She was HD stable but remained in pulmonary edema and ventilator dependent for 3 or 4 days. She ultimately recovered and had cardiac catheterization which revealed nonobstructive disease. She was started back on her Coumadin and discharged home on 10/07/2012. In terms of her hernia she has done well. Minimal discomfort. Normal appetite. Normal bowel function. No wound problems. She has not yet made an appointment at the [facility] heart care, but she has seen [name] in the past. Her Coumadin is managed at the [facility] internal medicine clinic. She asks if she can drive her car.  Exam: Morbidly obese. Alert. Pleasant. Mental status normal. No distress. Abdomen soft. Obese. Incisions are healing well. Small seroma in the hernia sac nontender.  Assessment: Incarcerated ventral incisional hernia, no apparent surgical complications following laparoscopic repair with mesh Small wound seroma. Hopefully this will resolve spontaneously. No intervention required at present Perioperative MI- and  pulmonary edema as described above. Resolving. Morbid obesity Obstructive sleep apnea Hypertension History DVT and pulmonary embolism, on chronic Coumadin  Plan: Diet and activities discussed. No strenuous activity heavy lifting or sports for 6 weeks She may drive her car I told her that she needs to get serious about weight reduction, and she says she has already started with this. Keep her regular appointment at the [facility] internal medicine Coumadin clinic She is going to go to [facility] heart care office now and make an appointment to see the cardiologist. She has been  followed by Dr. Myrtis Ser in the past. Return to see me in 6 weeks. If she has a persistent seroma we can aspirate this under ultrasound guidance.   Angelia Mould. Derrell Lolling, M.D., [facility] General and Minimally invasive Surgery Breast and Colorectal Surgery Office:   [phone] Pager:   [phone]

## 2012-10-18 ENCOUNTER — Ambulatory Visit: Payer: Medicare Other | Admitting: Internal Medicine

## 2012-10-18 ENCOUNTER — Ambulatory Visit: Payer: Medicare Other

## 2012-10-19 ENCOUNTER — Ambulatory Visit (INDEPENDENT_AMBULATORY_CARE_PROVIDER_SITE_OTHER): Payer: Medicare Other | Admitting: Radiation Oncology

## 2012-10-19 ENCOUNTER — Ambulatory Visit (INDEPENDENT_AMBULATORY_CARE_PROVIDER_SITE_OTHER): Payer: Medicare Other | Admitting: Pharmacist

## 2012-10-19 ENCOUNTER — Encounter: Payer: Self-pay | Admitting: Radiation Oncology

## 2012-10-19 VITALS — BP 127/73 | HR 76 | Temp 97.9°F | Ht 67.0 in | Wt 290.8 lb

## 2012-10-19 DIAGNOSIS — I2699 Other pulmonary embolism without acute cor pulmonale: Secondary | ICD-10-CM

## 2012-10-19 DIAGNOSIS — I509 Heart failure, unspecified: Secondary | ICD-10-CM

## 2012-10-19 DIAGNOSIS — I2129 ST elevation (STEMI) myocardial infarction involving other sites: Secondary | ICD-10-CM

## 2012-10-19 DIAGNOSIS — Z7901 Long term (current) use of anticoagulants: Secondary | ICD-10-CM

## 2012-10-19 LAB — BASIC METABOLIC PANEL
BUN: 17 mg/dL (ref 6–23)
Calcium: 9.7 mg/dL (ref 8.4–10.5)
Glucose, Bld: 108 mg/dL — ABNORMAL HIGH (ref 70–99)
Potassium: 3.7 mEq/L (ref 3.5–5.3)
Sodium: 139 mEq/L (ref 135–145)

## 2012-10-19 LAB — POCT INR: INR: 1.7

## 2012-10-19 NOTE — Assessment & Plan Note (Signed)
No chest pain or shortness of breath today. Patient appears to be recovering well. She is taking carvedilol and ASA as prescribed. Her lisinopril has been held since hospitalization secondary to acute kidney injury (Cr = 1.27 on 3/24). She has a followup appointment with cardiology on 10/28/2012. - appt with [facility] cardiology on 10/28/2012 - cont ASA - cont coreg - recheck creatinine => will restart lisinopril if creatinine significantly improved

## 2012-10-19 NOTE — Progress Notes (Signed)
Subjective:    Patient ID: Breanna Wilkins, female    DOB: [birth year], 52 y.o.   MRN: [id number]  HPI Patient is a 52 year old woman with PMH significant for PE on chronic warfarin anticoagulation, CHF, recent perioperative STEMI in association with a ventral hernia repair, who presents to clinic today for further followup after her initial hospital followup visit on 10/11/2012. At the time of her previous visit, the patient was noted to have an INR = 1.0, and had been discharged from the hospital with Lovenox injections but had not been taking warfarin. She was seen by Dr. Alexandria Lodge that day, who instructed her to continue Lovenox injections but also to restart taking warfarin, with the goal to bridge her back to a therapeutic INR. She was also noted to have an elevated creatinine of 1.27 on 3/24, which had persisted from her aforementioned hospitalization (baseline creatinine approximately 0.8). She had therefore been instructed to return to clinic today to have her creatinine rechecked, and to have her lisinopril restarted if her creatinine was improved.  The patient denies any recent chest pain or shortness of breath today. She was seen by general surgery last week for a postop follow-up visit, and was deemed to be recovering well  (although noted by Dr. Derrell Lolling to have small seroma which does not require intervention at this time). She denies any abdominal pain, nausea, vomiting, or diarrhea. She does admit to constipation which she attributes to the opioids she has recently been prescribed. She has no other complaints today, and states she's otherwise feeling well.   Review of Systems  All other systems reviewed and are negative.   Current Outpatient Medications: Current Outpatient Prescriptions  Medication Sig Dispense Refill  . aspirin 81 MG chewable tablet Chew 1 tablet (81 mg total) by mouth daily.  30 tablet  1  . carvedilol (COREG) 6.25 MG tablet Take 1 tablet (6.25 mg total) by mouth 2  (two) times daily with a meal.  60 tablet  0  . enoxaparin (LOVENOX) 150 MG/ML injection Inject 0.8 mLs (120 mg total) into the skin every 12 (twelve) hours.  5 Syringe  0  . furosemide (LASIX) 80 MG tablet Take 1 tablet (80 mg total) by mouth 2 (two) times daily.  60 tablet  0  . HYDROcodone-acetaminophen (NORCO/VICODIN) 5-325 MG per tablet Take 1 tablet by mouth every 6 (six) hours as needed for pain.  30 tablet  0  . lisinopril (PRINIVIL,ZESTRIL) 20 MG tablet Take 2 tablets (40 mg total) by mouth daily.      . potassium chloride (K-DUR,KLOR-CON) 10 MEQ tablet Take 1 tablet (10 mEq total) by mouth daily.  30 tablet  0  . pravastatin (PRAVACHOL) 40 MG tablet Take 20 mg by mouth at bedtime.      Marland Kitchen warfarin (COUMADIN) 10 MG tablet Take 5-10 mg by mouth daily. 5mg  daily except 10mg  on wednesdays       No current facility-administered medications for this visit.    Allergies: Allergies  Allergen Reactions  . Aspirin     REACTION: Nausea (325mg )     Past Medical History: Past Medical History  Diagnosis Date  . Shoulder pain   . Pulmonary embolus 01/16/10    on Coumadin indefinitely  . Abdominal hernia   . Hypertension   . Overweight   . Lung nodule 12/2009    Very small, left upper lobe by CT June, 2011, one-year followup was stable  . Shortness of breath   . Warfarin anticoagulation   .  Ejection fraction     65-70%, vigorous function, echo, March, 2011  . CHF (congestive heart failure)   . OSA (obstructive sleep apnea)     CPAP    Past Surgical History: Past Surgical History  Procedure Laterality Date  . Abdominal hysterectomy  07/2002    laparoscopic assisted vaginal hysterectomy for menorrhagia, dysmenorrhea, anemia, fibroids  . Knee arthroscopy Left   . Incisional hernia repair N/A 09/27/2012    Procedure: LAPAROSCOPIC INCISIONAL HERNIA possible open;  Surgeon: Ernestene Mention, MD;  Location: [facility] OR;  Service: General;  Laterality: N/A;  . Insertion of mesh N/A 09/27/2012     Procedure: INSERTION OF MESH;  Surgeon: Ernestene Mention, MD;  Location: [facility] OR;  Service: General;  Laterality: N/A;    Family History: Family History  Problem Relation Age of Onset  . Diabetes Father   . Diabetes Sister   . Diabetes Mother   . Deep vein thrombosis Sister   . Ovarian cancer Mother   . Bone cancer Maternal Grandmother     Social History: History   Social History  . Marital Status: Single    Spouse Name: N/A    Number of Children: N/A  . Years of Education: N/A   Occupational History  . Not on file.   Social History Main Topics  . Smoking status: Former Games developer  . Smokeless tobacco: Not on file     Comment: qoit 12 years ago  . Alcohol Use: 3.6 oz/week    0 Drinks containing 0.5 oz of alcohol, 6 Cans of beer per week     Comment: 6 pack of beer  . Drug Use: No     Comment: smoked joint 2 weeks ago  . Sexually Active: Not on file   Other Topics Concern  . Not on file   Social History Narrative   Financial assistance approved for 100% discount at [facility] and has [facility] card per Rudell Cobb   12/25/2009     Vital Signs: Blood pressure 127/73, pulse 76, temperature 97.9 F (36.6 C), temperature source Oral, height 5\' 7"  (1.702 m), weight 290 lb 12.8 oz (131.906 kg), SpO2 97.00%.      Objective:   Physical Exam  Constitutional: She is oriented to person, place, and time. She appears well-developed and well-nourished. No distress.  HENT:  Head: Normocephalic and atraumatic.  Eyes: Conjunctivae are normal. Pupils are equal, round, and reactive to light. No scleral icterus.  Neck: Normal range of motion. Neck supple. No tracheal deviation present.  Cardiovascular: Normal rate and regular rhythm.   Pulmonary/Chest: Effort normal. She has no wheezes. She has no rales.  Abdominal: Soft. Bowel sounds are normal. She exhibits no distension. There is tenderness.  Well-healing laparoscopic incision sites. Mild diffuse tenderness.   Musculoskeletal: Normal  range of motion. She exhibits no edema.  Neurological: She is alert and oriented to person, place, and time. No cranial nerve deficit.  Skin: Skin is warm and dry. No erythema.  Psychiatric: She has a normal mood and affect. Her behavior is normal.          Assessment & Plan:

## 2012-10-19 NOTE — Assessment & Plan Note (Signed)
No signs/symptoms of CHF exacerbation on visit today. She was instructed to continue all home medications including her carvedilol and Lasix. She will continue to hold her lisinopril pending an improvement in her creatinine that would suggest resolving AKI.

## 2012-10-19 NOTE — Assessment & Plan Note (Signed)
Lab Results  Component Value Date   INR 1.0 10/11/2012   INR 1.06 10/06/2012   INR 1.07 09/27/2012    Despite taking warfarin and Lovenox as prescribed, the patient's INR is still subtherapeutic today (1.7). She states that she eats salads regularly, and thus was instructed to avoid significant amounts of leafy green vegetables. She will meet with Dr. Alexandria Lodge today for further discussion of the issue.  - management per Dr. Alexandria Lodge

## 2012-10-19 NOTE — Patient Instructions (Signed)
We will contact you by phone regarding your lab results and possibly restarting your lisinopril. No changes were made to your medications during your visit.   It was a pleasure to meet you. Have a great day.

## 2012-10-19 NOTE — Progress Notes (Signed)
Anti-Coagulation Progress Note  Breanna Wilkins is a 52 y.o. female who is currently on an anti-coagulation regimen.    RECENT RESULTS: Recent results are below, the most recent result is correlated with a dose of 40 mg. per week: Lab Results  Component Value Date   INR 1.70 10/19/2012   INR 1.0 10/11/2012   INR 1.06 10/06/2012    ANTI-COAG DOSE: Anticoagulation Dose Instructions as of 10/19/2012     [name] Tue Wed Thu Fri Sat   New Dose 5 mg 5 mg 10 mg 10 mg 10 mg 5 mg 5 mg    Description       Will continue with LMWH BRIDGE THERAPY 120mg  SQ q12h until next INR.       ANTICOAG SUMMARY: Anticoagulation Episode Summary   Current INR goal 2.0-3.0   Next INR check 10/25/2012   INR from last check 1.70! (10/19/2012)   Weekly max dose    Target end date    INR check location [facility]   Preferred lab    Send INR reminders to       Comments         ANTICOAG TODAY: Anticoagulation Summary as of 10/19/2012   INR goal 2.0-3.0   Selected INR 1.70! (10/19/2012)   Next INR check 10/25/2012   Target end date     Anticoagulation Episode Summary   INR check location [facility]   Preferred lab    Send INR reminders to    Comments       PATIENT INSTRUCTIONS: Patient Instructions  Patient instructed to take medications as defined in the Anti-coagulation Track section of this encounter.  Patient instructed to take today's dose.  Patient verbalized understanding of these instructions.       FOLLOW-UP Return for Follow up INR at 1000h.  Hulen Luster, III Pharm.D., CACP

## 2012-10-19 NOTE — Patient Instructions (Signed)
Patient instructed to take medications as defined in the Anti-coagulation Track section of this encounter.  Patient instructed to take today's dose.  Patient verbalized understanding of these instructions.    

## 2012-10-19 NOTE — Progress Notes (Signed)
INTERNAL MEDICINE TEACHING ATTENDING ADDENDUM - Rocco Serene, MD: I reviewed with the resident Dr. Lavena Bullion, Ms. Sabatino' medical history, physical examination, diagnosis and results of tests and treatment and I agree with the patient's care as documented.

## 2012-10-20 ENCOUNTER — Encounter: Payer: Self-pay | Admitting: Cardiology

## 2012-10-20 DIAGNOSIS — I2510 Atherosclerotic heart disease of native coronary artery without angina pectoris: Secondary | ICD-10-CM | POA: Insufficient documentation

## 2012-10-25 ENCOUNTER — Ambulatory Visit (INDEPENDENT_AMBULATORY_CARE_PROVIDER_SITE_OTHER): Payer: Medicare Other | Admitting: Pharmacist

## 2012-10-25 DIAGNOSIS — Z7901 Long term (current) use of anticoagulants: Secondary | ICD-10-CM

## 2012-10-25 DIAGNOSIS — I2699 Other pulmonary embolism without acute cor pulmonale: Secondary | ICD-10-CM

## 2012-10-25 LAB — POCT INR: INR: 3

## 2012-10-25 MED ORDER — WARFARIN SODIUM 10 MG PO TABS: ORAL_TABLET | ORAL | Status: DC

## 2012-10-25 NOTE — Progress Notes (Signed)
Anti-Coagulation Progress Note  Breanna Wilkins is a 52 y.o. female who is currently on an anti-coagulation regimen.    RECENT RESULTS: Recent results are below, the most recent result is correlated with a dose of 50 mg. per week: Lab Results  Component Value Date   INR 3.0 10/25/2012   INR 1.70 10/19/2012   INR 1.0 10/11/2012    ANTI-COAG DOSE: Anticoagulation Dose Instructions as of 10/25/2012     [name] Tue Wed Thu Fri Sat   New Dose 5 mg 10 mg 5 mg 10 mg 5 mg 10 mg 5 mg       ANTICOAG SUMMARY: Anticoagulation Episode Summary   Current INR goal 2.0-3.0   Next INR check 11/15/2012   INR from last check 3.0 (10/25/2012)   Weekly max dose    Target end date    INR check location [facility]   Preferred lab    Send INR reminders to       Comments         ANTICOAG TODAY: Anticoagulation Summary as of 10/25/2012   INR goal 2.0-3.0   Selected INR 3.0 (10/25/2012)   Next INR check 11/15/2012   Target end date     Anticoagulation Episode Summary   INR check location [facility]   Preferred lab    Send INR reminders to    Comments       PATIENT INSTRUCTIONS: Patient Instructions  Patient instructed to take medications as defined in the Anti-coagulation Track section of this encounter.  Patient instructed to take today's dose.  Patient verbalized understanding of these instructions.       FOLLOW-UP Return in 3 weeks (on 11/15/2012) for Follow up INR at 2PM.  Hulen Luster, III Pharm.D., CACP

## 2012-10-25 NOTE — Patient Instructions (Signed)
Patient instructed to take medications as defined in the Anti-coagulation Track section of this encounter.  Patient instructed to take today's dose.  Patient verbalized understanding of these instructions.    

## 2012-10-25 NOTE — Addendum Note (Signed)
Addended by: Hulen Luster B on: 10/25/2012 03:05 PM   Modules accepted: Orders

## 2012-10-28 ENCOUNTER — Ambulatory Visit (INDEPENDENT_AMBULATORY_CARE_PROVIDER_SITE_OTHER): Payer: Medicare Other | Admitting: Cardiology

## 2012-10-28 ENCOUNTER — Encounter: Payer: Self-pay | Admitting: Cardiology

## 2012-10-28 VITALS — BP 132/90 | HR 76 | Ht 67.0 in | Wt 288.0 lb

## 2012-10-28 DIAGNOSIS — I2510 Atherosclerotic heart disease of native coronary artery without angina pectoris: Secondary | ICD-10-CM

## 2012-10-28 DIAGNOSIS — Z7901 Long term (current) use of anticoagulants: Secondary | ICD-10-CM

## 2012-10-28 DIAGNOSIS — I10 Essential (primary) hypertension: Secondary | ICD-10-CM

## 2012-10-28 DIAGNOSIS — K436 Other and unspecified ventral hernia with obstruction, without gangrene: Secondary | ICD-10-CM

## 2012-10-28 DIAGNOSIS — I509 Heart failure, unspecified: Secondary | ICD-10-CM

## 2012-10-28 NOTE — Assessment & Plan Note (Signed)
Blood pressures control. No change in therapy. 

## 2012-10-28 NOTE — Assessment & Plan Note (Signed)
Patient has only minimal coronary disease. No further workup at this time.  As part of today's evaluation I have reviewed her records extensively. In addition I have spent over 25 minutes with her total care. More than half of the 25 minutes was spent with direct contact with the patient reviewing all of the issues of her care.

## 2012-10-28 NOTE — Progress Notes (Signed)
HPI  The patient is seen to reassess her cardiac status after a prolonged hospitalization. She had surgery for an abdominal hernia and bowel abnormalities. Postoperatively she had respiratory distress with reintubation. Cardiac enzymes were elevated. It was felt that he would be optimal for her to undergo cardiac catheterization before leaving the hospital. This was done successfully from her right radial artery. She had minor nonobstructive coronary disease. LV function was vigorous. It was felt that she may have had a vasospastic event or an episode of stress cardiomyopathy that recovered rapidly. She did not require any further cardiac intervention. Her catheterization was done on October 06, 2012. Echo done on September 28, 2012 was technically very limited. There was hypokinesis of the inferior wall at the time of the echo. However at the time of the catheterization, later, she had vigorous overall LV function.  As part of today's evaluation I have completely reviewed the hospital records. In particular I have reviewed all details of the discharge summary. I have reviewed the specifics of the echo from March 11 and the cath from October 06, 2012.  Allergies  Allergen Reactions  . Aspirin     REACTION: Nausea (325mg )    Current Outpatient Prescriptions  Medication Sig Dispense Refill  . aspirin 81 MG chewable tablet Chew 1 tablet (81 mg total) by mouth daily.  30 tablet  1  . carvedilol (COREG) 6.25 MG tablet Take 1 tablet (6.25 mg total) by mouth 2 (two) times daily with a meal.  60 tablet  0  . furosemide (LASIX) 80 MG tablet Take 1 tablet (80 mg total) by mouth 2 (two) times daily.  60 tablet  0  . HYDROcodone-acetaminophen (NORCO/VICODIN) 5-325 MG per tablet Take 1 tablet by mouth every 6 (six) hours as needed for pain.  30 tablet  0  . potassium chloride (K-DUR,KLOR-CON) 10 MEQ tablet Take 1 tablet (10 mEq total) by mouth daily.  30 tablet  0  . pravastatin (PRAVACHOL) 40 MG tablet Take 20 mg  by mouth at bedtime.      Marland Kitchen warfarin (COUMADIN) 10 MG tablet Take 1 tablet on M/W/F; take 1/2 tablet on all other days.  50 tablet  2   No current facility-administered medications for this visit.    History   Social History  . Marital Status: Single    Spouse Name: N/A    Number of Children: N/A  . Years of Education: N/A   Occupational History  . Not on file.   Social History Main Topics  . Smoking status: Former Games developer  . Smokeless tobacco: Not on file     Comment: qoit 12 years ago  . Alcohol Use: 3.6 oz/week    0 Drinks containing 0.5 oz of alcohol, 6 Cans of beer per week     Comment: 6 pack of beer  . Drug Use: No     Comment: smoked joint 2 weeks ago  . Sexually Active: Not on file   Other Topics Concern  . Not on file   Social History Narrative   Financial assistance approved for 100% discount at [facility] and has [facility] card per Rudell Cobb   12/25/2009    Family History  Problem Relation Age of Onset  . Diabetes Father   . Diabetes Sister   . Diabetes Mother   . Deep vein thrombosis Sister   . Ovarian cancer Mother   . Bone cancer Maternal Grandmother     Past Medical History  Diagnosis Date  .  Shoulder pain   . Pulmonary embolus 01/16/10    on Coumadin indefinitely  . Abdominal hernia   . Hypertension   . Overweight   . Lung nodule 12/2009    Very small, left upper lobe by CT June, 2011, one-year followup was stable  . Shortness of breath   . Warfarin anticoagulation   . Ejection fraction     65-70%, vigorous function, echo, March, 2011  . CHF (congestive heart failure)   . OSA (obstructive sleep apnea)     CPAP  . CAD (coronary artery disease)     Perioperative non-STEMI March, 2014  //   cardiac catheterization at time of non-STEMI, March, 2014, minor nonobstructive coronary disease, vigorous LV function, possibly vasospastic event versus stress cardiomyopathy. No further workup of coronary disease  . Ejection fraction     Vigorous function at  time of catheterization October 06, 2012,    Past Surgical History  Procedure Laterality Date  . Abdominal hysterectomy  07/2002    laparoscopic assisted vaginal hysterectomy for menorrhagia, dysmenorrhea, anemia, fibroids  . Knee arthroscopy Left   . Incisional hernia repair N/A 09/27/2012    Procedure: LAPAROSCOPIC INCISIONAL HERNIA possible open;  Surgeon: Ernestene Mention, MD;  Location: [facility] OR;  Service: General;  Laterality: N/A;  . Insertion of mesh N/A 09/27/2012    Procedure: INSERTION OF MESH;  Surgeon: Ernestene Mention, MD;  Location: [facility] OR;  Service: General;  Laterality: N/A;    Patient Active Problem List  Diagnosis  . OBSTRUCTIVE SLEEP APNEA  . Essential hypertension, benign  . Supraspinatus tendon tear  . PLANTAR FASCIITIS, BILATERAL  . Long-term (current) use of anticoagulants  . Lung nodule  . Popliteal DVT (deep venous thrombosis)  . Ejection fraction  . Warfarin anticoagulation  . Pulmonary embolus  . Obesity  . Other and unspecified hyperlipidemia  . Ventral hernia with obstruction  . Acute respiratory failure  . Hypotension, unspecified  . Altered mental status  . NSTEMI (non-ST elevated myocardial infarction)  . ST elevation myocardial infarction (STEMI) of lateral wall  . CHF NYHA class II (symptoms with moderately strenuous activities)  . CAD (coronary artery disease)    ROS   Patient denies fever, chills, headache, sweats, rash, change in vision, change in hearing, chest pain, cough, nausea vomiting, urinary symptoms. She does have tenderness to the skin in the area of her nominal surgery.  All other systems are reviewed and are negative.  PHYSICAL EXAM  The patient is overweight but she is losing weight and feeling good about this. There is no jugulovenous distention. Lungs are clear. Respiratory effort is nonlabored. Cardiac exam reveals S1 and S2. There no clicks or significant murmurs. The abdomen is soft. There is no peripheral edema. There no  musculoskeletal deformities. There are no skin rashes.  Filed Vitals:   10/28/12 1155  BP: 132/90  Pulse: 76  Height: 5\' 7"  (1.702 m)  Weight: 288 lb (130.636 kg)   This  ASSESSMENT & PLAN

## 2012-10-28 NOTE — Assessment & Plan Note (Signed)
Patient had surgery for this on September 27, 2012. After that time she had respiratory failure and elevated cardiac enzymes. Fortunately her catheterization revealed only minimal coronary disease. She is recovering from her abdominal surgery.

## 2012-10-28 NOTE — Assessment & Plan Note (Addendum)
The patient continues on Coumadin. Over time we will have to rediscuss if her Coumadin can be stopped.

## 2012-10-28 NOTE — Patient Instructions (Addendum)
Your physician wants you to follow-up in: 4 months You will receive a reminder letter in the mail two months in advance. If you don't receive a letter, please call our office to schedule the follow-up appointment.     .Your physician recommends that you continue on your current medications as directed. Please refer to the Current Medication list given to you today.  

## 2012-11-01 ENCOUNTER — Other Ambulatory Visit: Payer: Self-pay | Admitting: *Deleted

## 2012-11-04 MED ORDER — FUROSEMIDE 80 MG PO TABS: 80.0000 mg | ORAL_TABLET | Freq: Two times a day (BID) | ORAL | Status: DC

## 2012-11-04 MED ORDER — CARVEDILOL 6.25 MG PO TABS: 6.2500 mg | ORAL_TABLET | Freq: Two times a day (BID) | ORAL | Status: DC

## 2012-11-04 MED ORDER — POTASSIUM CHLORIDE CRYS ER 10 MEQ PO TBCR: 10.0000 meq | EXTENDED_RELEASE_TABLET | Freq: Every day | ORAL | Status: DC

## 2012-11-04 NOTE — Telephone Encounter (Signed)
Pt called and stated she's running out of med.

## 2012-11-04 NOTE — Telephone Encounter (Signed)
Rxs called to [facility]. 

## 2012-11-15 ENCOUNTER — Ambulatory Visit (INDEPENDENT_AMBULATORY_CARE_PROVIDER_SITE_OTHER): Payer: Medicare Other | Admitting: Pharmacist

## 2012-11-15 DIAGNOSIS — Z7901 Long term (current) use of anticoagulants: Secondary | ICD-10-CM

## 2012-11-15 NOTE — Patient Instructions (Signed)
Patient instructed to take medications as defined in the Anti-coagulation Track section of this encounter.  Patient instructed to OMIT/HOLD today's dose.  Patient verbalized understanding of these instructions.    

## 2012-11-15 NOTE — Progress Notes (Signed)
Anti-Coagulation Progress Note  Breanna Wilkins is a 52 y.o. female who is currently on an anti-coagulation regimen.    RECENT RESULTS: Recent results are below, the most recent result is correlated with a dose of 50 mg. per week: Will OMIT/HOLD today's dose and decrease to 45mg /wk total. Return to clinic in 1 week. Lab Results  Component Value Date   INR 6.80 11/15/2012   INR 3.0 10/25/2012   INR 1.70 10/19/2012    ANTI-COAG DOSE: Anticoagulation Dose Instructions as of 11/15/2012     [name] Tue Wed Thu Fri Sat   New Dose 5 mg 10 mg 5 mg 5 mg 5 mg 10 mg 5 mg       ANTICOAG SUMMARY: Anticoagulation Episode Summary   Current INR goal 2.0-3.0   Next INR check 11/22/2012   INR from last check 6.80! (11/15/2012)   Weekly max dose    Target end date    [facility]   Preferred lab    Send INR reminders to       Comments         ANTICOAG TODAY: Anticoagulation Summary as of 11/15/2012   INR goal 2.0-3.0   Selected INR 6.80! (11/15/2012)   Next INR check 11/22/2012   Target end date     Anticoagulation Episode Summary   [facility]   Preferred lab    Send INR reminders to    Comments       PATIENT INSTRUCTIONS: Patient Instructions  Patient instructed to take medications as defined in the Anti-coagulation Track section of this encounter.  Patient instructed to OMIT/HOLD today's dose.  Patient verbalized understanding of these instructions.       FOLLOW-UP Return in 7 days (on 11/22/2012) for Follow up INR at 2:15PM.  Hulen Luster, III Pharm.D., CACP

## 2012-11-22 ENCOUNTER — Ambulatory Visit (INDEPENDENT_AMBULATORY_CARE_PROVIDER_SITE_OTHER): Payer: Medicare Other | Admitting: Pharmacist

## 2012-11-22 DIAGNOSIS — Z7901 Long term (current) use of anticoagulants: Secondary | ICD-10-CM

## 2012-11-22 LAB — POCT INR: INR: 3.8

## 2012-11-22 NOTE — Patient Instructions (Signed)
Patient instructed to take medications as defined in the Anti-coagulation Track section of this encounter.  Patient instructed to take today's dose.  Patient verbalized understanding of these instructions.    

## 2012-11-22 NOTE — Progress Notes (Signed)
Anti-Coagulation Progress Note  Breanna Wilkins is a 52 y.o. female who is currently on an anti-coagulation regimen.    RECENT RESULTS: Recent results are below, the most recent result is correlated with a dose of 45 mg. per week: Lab Results  Component Value Date   INR 3.80 11/22/2012   INR 6.80 11/15/2012   INR 3.0 10/25/2012    ANTI-COAG DOSE: Anticoagulation Dose Instructions as of 11/22/2012     [name] Tue Wed Thu Fri Sat   New Dose 5 mg 5 mg 5 mg 10 mg 5 mg 5 mg 5 mg       ANTICOAG SUMMARY: Anticoagulation Episode Summary   Current INR goal 2.0-3.0   Next INR check 11/29/2012   INR from last check 3.80! (11/22/2012)   Weekly max dose    Target end date    [facility]   Preferred lab    Send INR reminders to       Comments         ANTICOAG TODAY: Anticoagulation Summary as of 11/22/2012   INR goal 2.0-3.0   Selected INR 3.80! (11/22/2012)   Next INR check 11/29/2012   Target end date     Anticoagulation Episode Summary   [facility]   Preferred lab    Send INR reminders to    Comments       PATIENT INSTRUCTIONS: Patient Instructions  Patient instructed to take medications as defined in the Anti-coagulation Track section of this encounter.  Patient instructed to take today's dose.  Patient verbalized understanding of these instructions.       FOLLOW-UP Return in 7 days (on 11/29/2012) for Follow up INR at 2PM.  Hulen Luster, III Pharm.D., CACP

## 2012-11-29 ENCOUNTER — Ambulatory Visit: Payer: Medicare Other | Admitting: Internal Medicine

## 2012-11-29 ENCOUNTER — Ambulatory Visit (INDEPENDENT_AMBULATORY_CARE_PROVIDER_SITE_OTHER): Payer: Medicare Other | Admitting: Pharmacist

## 2012-11-29 DIAGNOSIS — I2699 Other pulmonary embolism without acute cor pulmonale: Secondary | ICD-10-CM

## 2012-11-29 DIAGNOSIS — Z7901 Long term (current) use of anticoagulants: Secondary | ICD-10-CM

## 2012-11-29 NOTE — Patient Instructions (Signed)
Patient instructed to take medications as defined in the Anti-coagulation Track section of this encounter.  Patient instructed to OMIT today's dose. Omit doses every Monday Patient verbalized understanding of these instructions.

## 2012-11-29 NOTE — Progress Notes (Signed)
Anti-Coagulation Progress Note  Breanna Wilkins is a 52 y.o. female who is currently on an anti-coagulation regimen.    RECENT RESULTS: Recent results are below, the most recent result is correlated with a dose of 40 mg. per week: Lab Results  Component Value Date   INR 4.50 11/29/2012   INR 3.80 11/22/2012   INR 6.80 11/15/2012    ANTI-COAG DOSE: Anticoagulation Dose Instructions as of 11/29/2012     [name] Tue Wed Thu Fri Sat   New Dose 5 mg 0 mg 5 mg 5 mg 5 mg 5 mg 5 mg       ANTICOAG SUMMARY: Anticoagulation Episode Summary   Current INR goal 2.0-3.0   Next INR check 12/20/2012   INR from last check 4.50! (11/29/2012)   Weekly max dose    Target end date    [facility]   Preferred lab    Send INR reminders to       Comments         ANTICOAG TODAY: Anticoagulation Summary as of 11/29/2012   INR goal 2.0-3.0   Selected INR 4.50! (11/29/2012)   Next INR check 12/20/2012   Target end date     Anticoagulation Episode Summary   [facility] check [facility]   Preferred lab    Send INR reminders to    Comments       PATIENT INSTRUCTIONS: Patient Instructions  Patient instructed to take medications as defined in the Anti-coagulation Track section of this encounter.  Patient instructed to OMIT today's dose. Omit doses every Monday Patient verbalized understanding of these instructions.       FOLLOW-UP Return in 3 weeks (on 12/20/2012) for Follow up INR at 2PM.  Hulen Luster, III Pharm.D., CACP

## 2012-12-07 ENCOUNTER — Ambulatory Visit (INDEPENDENT_AMBULATORY_CARE_PROVIDER_SITE_OTHER): Payer: Medicare Other | Admitting: Internal Medicine

## 2012-12-07 ENCOUNTER — Ambulatory Visit (INDEPENDENT_AMBULATORY_CARE_PROVIDER_SITE_OTHER): Payer: Medicare Other | Admitting: General Surgery

## 2012-12-07 ENCOUNTER — Encounter (INDEPENDENT_AMBULATORY_CARE_PROVIDER_SITE_OTHER): Payer: Self-pay | Admitting: General Surgery

## 2012-12-07 ENCOUNTER — Encounter: Payer: Self-pay | Admitting: Internal Medicine

## 2012-12-07 VITALS — BP 135/82 | HR 72 | Temp 98.2°F | Ht 67.0 in | Wt 291.0 lb

## 2012-12-07 VITALS — BP 120/82 | HR 60 | Temp 97.1°F | Resp 18 | Ht 67.0 in | Wt 289.4 lb

## 2012-12-07 DIAGNOSIS — IMO0002 Reserved for concepts with insufficient information to code with codable children: Secondary | ICD-10-CM

## 2012-12-07 DIAGNOSIS — M25511 Pain in right shoulder: Secondary | ICD-10-CM

## 2012-12-07 DIAGNOSIS — K436 Other and unspecified ventral hernia with obstruction, without gangrene: Secondary | ICD-10-CM

## 2012-12-07 DIAGNOSIS — M25519 Pain in unspecified shoulder: Secondary | ICD-10-CM

## 2012-12-07 DIAGNOSIS — E785 Hyperlipidemia, unspecified: Secondary | ICD-10-CM

## 2012-12-07 DIAGNOSIS — Z139 Encounter for screening, unspecified: Secondary | ICD-10-CM

## 2012-12-07 DIAGNOSIS — I10 Essential (primary) hypertension: Secondary | ICD-10-CM

## 2012-12-07 LAB — BASIC METABOLIC PANEL WITH GFR
BUN: 12 mg/dL (ref 6–23)
CO2: 29 mEq/L (ref 19–32)
Calcium: 9.5 mg/dL (ref 8.4–10.5)
Chloride: 101 mEq/L (ref 96–112)
Creat: 0.85 mg/dL (ref 0.50–1.10)

## 2012-12-07 LAB — LIPID PANEL
Cholesterol: 200 mg/dL (ref 0–200)
Total CHOL/HDL Ratio: 4.3 Ratio
VLDL: 30 mg/dL (ref 0–40)

## 2012-12-07 MED ORDER — PANTOPRAZOLE SODIUM 40 MG PO TBEC: DELAYED_RELEASE_TABLET | ORAL | Status: DC

## 2012-12-07 MED ORDER — CARVEDILOL 6.25 MG PO TABS: 6.2500 mg | ORAL_TABLET | Freq: Two times a day (BID) | ORAL | Status: DC

## 2012-12-07 MED ORDER — TRAMADOL HCL 50 MG PO TABS: 50.0000 mg | ORAL_TABLET | Freq: Three times a day (TID) | ORAL | Status: DC | PRN

## 2012-12-07 NOTE — Assessment & Plan Note (Signed)
BP Readings from Last 3 Encounters:  12/07/12 135/82  12/07/12 120/82  10/28/12 132/90    Lab Results  Component Value Date   NA 139 10/19/2012   K 3.7 10/19/2012   CREATININE 1.13* 10/19/2012    Assessment: Blood pressure control:   well-controlled Progress toward BP goal:    yes   Plan: Medications:  Continue current medications Educational resources provided: brochure;video Self management tools provided: home blood pressure logbook Other plans: Check BMP today to make sure that her renal failure has resolved and that her potassium is normal. Her potassium is within normal limit, patient may stop taking potassium supplement

## 2012-12-07 NOTE — Progress Notes (Signed)
Patient ID: Breanna Wilkins, female   DOB: 02/07/1961, 52 y.o.   MRN: [id number] History: This patient returns for followup regarding her abdominal hernia repair. She underwent elective laparoscopic lysis of adhesions and laparoscopic ventral hernia repair with mesh on 09/27/2012. She went into pulmonary edema in the recovery room, was reintubated, and was found to have a small lateral myocardial infarction. She was HD stable but remained in pulmonary edema and ventilator dependent for 3 or 4 days. She ultimately recovered and had cardiac catheterization which revealed nonobstructive disease. She was started back on her Coumadin and discharged home on 10/07/2012. She is followed by [name] at [facility] cardiology. She is followed by Dr. Rosana Berger at [facility] internal medicine clinic. In terms of her hernia she has done well. Minimal discomfort. Normal appetite. Normal bowel function. No wound problems.  . Her Coumadin is managed at the [facility] internal medicine clinic.  Exam: Patient looks like she feels well in good spirits. Overweight. Abdomen soft. Obese. Examined supine and standing. Incisions are well healed. Hernia repair intact. Tiny seroma remains in the hernia sac nontender.  Assessment: Incarcerated ventral incisional hernia, no apparent surgical complications following laparoscopic with repair with mesh Small wound seroma, resolving spontaneously Perioperative myocardial infarction and pulmonary edema, stable Morbid obesity Obstructive sleep apnea Hypertension History DVT and pulmonary embolism, on chronic Coumadin  Plan: The diet and activities discussed Strongly encouraged weight reduction She will continue to follow with Dr. Rosana Berger  at the internal medicine clinic, and with Dr. [name] at [facility] cardiology Return to see me if further problems arise    Angelia Mould. Derrell Lolling, M.D., [facility] General and Minimally invasive Surgery Breast and  Colorectal Surgery Office:   [phone] Pager:   [phone]

## 2012-12-07 NOTE — Patient Instructions (Addendum)
Lab work today and I will call you with abnormal results Hold Klor-con (potassium pill) until I call you with results Do not start Tramadol until I call you with lab results Start Protonix 40mg  one tablet daily before your big meal for heart burn Follow up with Dr. Simonne Come Follow up in 3 months

## 2012-12-07 NOTE — Progress Notes (Signed)
Patient ID: Breanna Wilkins, female   DOB: 05-19-1961, 52 y.o.   MRN: [id number] HPI: Breanna Wilkins is a 52 yo woman with PMH of HTN, hyperlipidemia, recurrent PEs on chronic Coumadin, chronic right shoulder pain secondary to Full-thickness retracted supraspinatus tendon tear and  significant surrounding rotator cuff tendinopathy seen on MRI in May of 2012 however patient does not want to pursue surgery. She presents today with right shoulder pain and is unable to raise her right arm above her shoulder. She states that she has some numbness and tingling down her right arm as well as pain but denies any weakness. She also reports some neck pain that radiates down to her hands as well. She was given Vicodin back in March but it does not work for her. She is asking about the option of tramadol however she does have mild renal failure during her recent hospitalization for ventral hernia repair and she developed acute respiratory failure post op as well as a non-STEMI.  She also reports being very thirsty and has been drinking a lot of Kool-Aid recently. She denies in increasing urinary frequency. There is a family history of diabetes.  ROS: as per HPI  PE: General: alert, well-developed, and cooperative to examination.  Lungs: normal respiratory effort, no accessory muscle use, normal breath sounds, no crackles, and no wheezes. Heart: normal rate, regular rhythm, no murmur, no gallop, and no rub.  Abdomen: Obese, soft, non-tender, normal bowel sounds, no distention, no guarding, no rebound tenderness Neurologic: nonfocal, good hand grip bilaterally   extremity: Right upper extremity: Limited range of motion, patient can abduct to 60 with passive motion, she is unable to do the back scratch test. Tender to palpation around deltoid area. No edema or erythema noted Skin: turgor normal and no rashes.  Psych: appropriate

## 2012-12-07 NOTE — Assessment & Plan Note (Addendum)
MRI in May 2012 showed full-thickness supraspinatus tendon tear of right shoulder. Patient does not want surgery given her recent complication postop ventral hernia in which she require ventilator as well as a non-STEMI event.  -I encouraged patient to try physical therapy again -She may need another steroid injection -Need to evaluate by orthopedics preferably Dr. Dion Saucier -Pain control with tramadol if her renal failure resolved -Consider MR neck next office visit if she continues to have neck pain

## 2012-12-07 NOTE — Patient Instructions (Signed)
You have recovered from your  laparoscopic ventral hernia repair without any obvious direct surgical complications.  Take a long walking everyday.  You are strongly encouraged to lose weight  Return to see Dr. Derrell Lolling if further problems arise.

## 2012-12-08 ENCOUNTER — Other Ambulatory Visit: Payer: Self-pay | Admitting: Internal Medicine

## 2012-12-08 MED ORDER — POTASSIUM CHLORIDE CRYS ER 20 MEQ PO TBCR: 20.0000 meq | EXTENDED_RELEASE_TABLET | Freq: Every day | ORAL | Status: DC

## 2012-12-08 MED ORDER — PRAVASTATIN SODIUM 40 MG PO TABS: 40.0000 mg | ORAL_TABLET | Freq: Every day | ORAL | Status: DC

## 2012-12-09 NOTE — Progress Notes (Incomplete)
.  shilpacl

## 2012-12-13 NOTE — Progress Notes (Signed)
INTERNAL MEDICINE TEACHING ATTENDING ADDENDUM: I discussed this case with Dr. Ho soon after the patient visit. I have read the documentation and I agree with the plan of care. Please see the resident note for details of management.   

## 2012-12-14 ENCOUNTER — Other Ambulatory Visit (INDEPENDENT_AMBULATORY_CARE_PROVIDER_SITE_OTHER): Payer: Medicare Other

## 2012-12-14 ENCOUNTER — Other Ambulatory Visit: Payer: Self-pay | Admitting: *Deleted

## 2012-12-14 DIAGNOSIS — R7309 Other abnormal glucose: Secondary | ICD-10-CM

## 2012-12-14 LAB — POCT GLYCOSYLATED HEMOGLOBIN (HGB A1C): Hemoglobin A1C: 5.9

## 2012-12-20 ENCOUNTER — Ambulatory Visit (INDEPENDENT_AMBULATORY_CARE_PROVIDER_SITE_OTHER): Payer: Medicare Other | Admitting: Internal Medicine

## 2012-12-20 ENCOUNTER — Encounter: Payer: Self-pay | Admitting: Internal Medicine

## 2012-12-20 ENCOUNTER — Ambulatory Visit (INDEPENDENT_AMBULATORY_CARE_PROVIDER_SITE_OTHER): Payer: Medicare Other | Admitting: Pharmacist

## 2012-12-20 ENCOUNTER — Encounter: Payer: Self-pay | Admitting: *Deleted

## 2012-12-20 VITALS — BP 128/85 | HR 89 | Temp 97.1°F | Ht 67.0 in | Wt 287.0 lb

## 2012-12-20 DIAGNOSIS — S40021A Contusion of right upper arm, initial encounter: Secondary | ICD-10-CM

## 2012-12-20 DIAGNOSIS — S40029A Contusion of unspecified upper arm, initial encounter: Secondary | ICD-10-CM

## 2012-12-20 DIAGNOSIS — T148XXA Other injury of unspecified body region, initial encounter: Secondary | ICD-10-CM

## 2012-12-20 DIAGNOSIS — Z7901 Long term (current) use of anticoagulants: Secondary | ICD-10-CM

## 2012-12-20 LAB — POCT INR: INR: 2.5

## 2012-12-20 LAB — CBC
HCT: 39 % (ref 36.0–46.0)
Hemoglobin: 12.9 g/dL (ref 12.0–15.0)
RBC: 4.87 MIL/uL (ref 3.87–5.11)
WBC: 6.5 10*3/uL (ref 4.0–10.5)

## 2012-12-20 NOTE — Patient Instructions (Signed)
Patient instructed to take medications as defined in the Anti-coagulation Track section of this encounter.  Patient instructed to take today's dose.  Patient verbalized understanding of these instructions.    

## 2012-12-20 NOTE — Progress Notes (Signed)
Anti-Coagulation Progress Note  Breanna Wilkins is a 52 y.o. female who is currently on an anti-coagulation regimen.    RECENT RESULTS: Recent results are below, the most recent result is correlated with a dose of 30 mg. per week: Lab Results  Component Value Date   INR 2.50 12/20/2012   INR 4.50 11/29/2012   INR 3.80 11/22/2012    ANTI-COAG DOSE: Anticoagulation Dose Instructions as of 12/20/2012     [name] Tue Wed Thu Fri Sat   New Dose 5 mg 0 mg 5 mg 5 mg 5 mg 5 mg 5 mg       ANTICOAG SUMMARY: Anticoagulation Episode Summary   Current INR goal 2.0-3.0   Next INR check 01/10/2013   INR from last check 2.50 (12/20/2012)   Weekly max dose    Target end date    [facility] location [facility]   Preferred lab    Send INR reminders to       Comments         ANTICOAG TODAY: Anticoagulation Summary as of 12/20/2012   INR goal 2.0-3.0   Selected INR 2.50 (12/20/2012)   Next INR check 01/10/2013   Target end date     Anticoagulation Episode Summary   [facility]R check location [facility]   Preferred lab    Send INR reminders to    Comments       PATIENT INSTRUCTIONS: Patient Instructions  Patient instructed to take medications as defined in the Anti-coagulation Track section of this encounter.  Patient instructed to take today's dose.  Patient verbalized understanding of these instructions.       FOLLOW-UP Return in 3 weeks (on 01/10/2013) for Follow up INR at 2:30PM.  Hulen Luster, III Pharm.D., CACP

## 2012-12-20 NOTE — Patient Instructions (Addendum)
Please schedule a follow up appointment with your PCP as needed . Please bring your medication bottles with your next appointment. Please take your medicines as prescribed. Please put some ice on the affected area.

## 2012-12-20 NOTE — Progress Notes (Signed)
Pt in to see dr Alexandria Lodge, has large bruise, told to make appt, area is soft but concerning, 1545 dr Dorthula Rue

## 2012-12-21 ENCOUNTER — Encounter: Payer: Medicare Other | Admitting: Internal Medicine

## 2012-12-21 DIAGNOSIS — S40029A Contusion of unspecified upper arm, initial encounter: Secondary | ICD-10-CM | POA: Insufficient documentation

## 2012-12-21 NOTE — Progress Notes (Signed)
Subjective:   Patient ID: Breanna Wilkins female   DOB: Dec 26, 1960 52 y.o.   MRN: [id number]  HPI: 52 year old woman with past medical history significant for pulmonary embolus on lifelong Coumadin presents to the clinic for evaluation of a bruise.  She was seen by Dr. Alexandria Lodge for her INR monitoring and was noted to have a bruise on her right arm for which a clinic appointment was scheduled.  Patient states that while she was taking a shower last week and was cleaning her back when she felt a cramp in her right arm followed by a large bruise. She reports that it has decreased in size and is getting better. Patient denies any pain or warmth associated with it. Also denies any fever.     Past Medical History  Diagnosis Date  . Shoulder pain   . Pulmonary embolus 01/16/10    on Coumadin indefinitely  . Abdominal hernia   . Hypertension   . Overweight(278.02)   . Lung nodule 12/2009    Very small, left upper lobe by CT June, 2011, one-year followup was stable  . Shortness of breath   . Warfarin anticoagulation   . Ejection fraction     65-70%, vigorous function, echo, March, 2011  . CHF (congestive heart failure)   . OSA (obstructive sleep apnea)     CPAP  . CAD (coronary artery disease)     Perioperative non-STEMI March, 2014  //   cardiac catheterization at time of non-STEMI, March, 2014, minor nonobstructive coronary disease, vigorous LV function, possibly vasospastic event versus stress cardiomyopathy. No further workup of coronary disease  . Ejection fraction     Vigorous function at time of catheterization October 06, 2012,   Family History  Problem Relation Age of Onset  . Diabetes Father   . Diabetes Sister   . Diabetes Mother   . Deep vein thrombosis Sister   . Ovarian cancer Mother   . Bone cancer Maternal Grandmother    History   Social History  . Marital Status: Single    Spouse Name: N/A    Number of Children: N/A  . Years of Education: N/A   Occupational History   . Not on file.   Social History Main Topics  . Smoking status: Former Games developer  . Smokeless tobacco: Not on file     Comment: qoit 12 years ago  . Alcohol Use: 3.6 oz/week    0 Drinks containing 0.5 oz of alcohol, 6 Cans of beer per week     Comment: 6 pack of beer  . Drug Use: No     Comment: smoked joint 2 weeks ago  . Sexually Active: Not on file   Other Topics Concern  . Not on file   Social History Narrative   Financial assistance approved for 100% discount at [facility] and has [facility] card per Rudell Cobb   12/25/2009   Review of Systems: General: Denies fever, chills, diaphoresis, appetite change and fatigue. HEENT: Denies photophobia, eye pain, redness, hearing loss, ear pain, congestion, sore throat, rhinorrhea, sneezing, mouth sores, trouble swallowing, neck pain, neck stiffness and tinnitus. Respiratory: Denies SOB, DOE, cough, chest tightness, and wheezing. Cardiovascular: Denies to chest pain, palpitations and leg swelling. Gastrointestinal: Denies nausea, vomiting, abdominal pain, diarrhea, constipation, blood in stool and abdominal distention. Genitourinary: Denies dysuria, urgency, frequency, hematuria, flank pain and difficulty urinating. Musculoskeletal: Denies myalgias, back pain, joint swelling, arthralgias and gait problem.  Skin: Denies pallor, rash and wound. Neurological: Denies dizziness, seizures, syncope,  weakness, light-headedness, numbness and headaches. Hematological: Denies adenopathy, easy bruising, personal or family bleeding history. Psychiatric/Behavioral: Denies suicidal ideation, mood changes, confusion, nervousness, sleep disturbance and agitation.    Current Outpatient Medications: Current Outpatient Prescriptions  Medication Sig Dispense Refill  . aspirin 81 MG chewable tablet Chew 1 tablet (81 mg total) by mouth daily.  30 tablet  1  . carvedilol (COREG) 6.25 MG tablet Take 1 tablet (6.25 mg total) by mouth 2 (two) times daily with a meal.  60  tablet  11  . furosemide (LASIX) 80 MG tablet Take 1 tablet (80 mg total) by mouth 2 (two) times daily.  60 tablet  2  . HYDROcodone-acetaminophen (NORCO/VICODIN) 5-325 MG per tablet Take 1 tablet by mouth every 6 (six) hours as needed for pain.  30 tablet  0  . pantoprazole (PROTONIX) 40 MG tablet Take 1 tablet 30 minutes before meal (lunch/dinner)  30 tablet  3  . potassium chloride (K-DUR,KLOR-CON) 20 MEQ tablet Take 1 tablet (20 mEq total) by mouth daily.  30 tablet  6  . pravastatin (PRAVACHOL) 40 MG tablet Take 1 tablet (40 mg total) by mouth at bedtime.  30 tablet  11  . traMADol (ULTRAM) 50 MG tablet Take 1 tablet (50 mg total) by mouth every 8 (eight) hours as needed for pain.  90 tablet  2  . warfarin (COUMADIN) 10 MG tablet Take 1 tablet on M/W/F; take 1/2 tablet on all other days.  50 tablet  2   No current facility-administered medications for this visit.    Allergies: Allergies  Allergen Reactions  . Aspirin     REACTION: Nausea (325mg )      Objective:   Physical Exam: Filed Vitals:   12/20/12 1631  BP: 128/85  Pulse: 89  Temp: 97.1 F (36.2 C)    General: Vital signs reviewed and noted. Well-developed, well-nourished, in no acute distress; alert, appropriate and cooperative throughout examination. Head: Normocephalic, atraumatic Lungs: Normal respiratory effort. Clear to auscultation BL without crackles or wheezes. Heart: RRR. S1 and S2 normal without gallop, murmur, or rubs. Abdomen:BS normoactive. Soft, Nondistended, non-tender.  No masses or organomegaly. Extremities: No pretibial edema. Right arm in the middle has a circular bruise with ill defined margins about 6 cm in diameter, no warmth or tenderness noted.      Assessment & Plan:

## 2012-12-21 NOTE — Assessment & Plan Note (Signed)
Patient noted to have a bruise on her right arm for a week in the setting of being on Coumadin. She herself reports that it is getting better and has no pain whatsoever. On my exam she was not noted to have any tenderness or warmth associated with it. Her INR for today was 2.5 -Apply ice to the affected area. -Check CBC to make sure her platelets and hemoglobin is stable. -Call the clinic if it increases in size or if she spikes fever or start having pain.   Update: Her Hb and platelets are stable. Patient was informed on the results.

## 2012-12-21 NOTE — Progress Notes (Signed)
Patient was seen in the clinic. Thanks, [employer]

## 2012-12-22 NOTE — Addendum Note (Signed)
Addended by: Neomia Dear on: 12/22/2012 08:22 AM   Modules accepted: Orders

## 2012-12-23 ENCOUNTER — Ambulatory Visit (HOSPITAL_COMMUNITY)
Admission: RE | Admit: 2012-12-23 | Discharge: 2012-12-23 | Disposition: A | Discharge status: Home / Self Care | Payer: Medicare Other | Source: Ambulatory Visit | Attending: Internal Medicine | Admitting: Internal Medicine

## 2012-12-23 DIAGNOSIS — Z1231 Encounter for screening mammogram for malignant neoplasm of breast: Secondary | ICD-10-CM | POA: Insufficient documentation

## 2012-12-23 DIAGNOSIS — Z139 Encounter for screening, unspecified: Secondary | ICD-10-CM

## 2012-12-24 ENCOUNTER — Other Ambulatory Visit: Payer: Self-pay | Admitting: *Deleted

## 2012-12-24 NOTE — Telephone Encounter (Signed)
Pt called because she could not get refill on [facility]. I called pharmacy and they had Rx but had put it away because pt didn't pick up. Now ready again and pt aware.

## 2013-01-10 ENCOUNTER — Ambulatory Visit (INDEPENDENT_AMBULATORY_CARE_PROVIDER_SITE_OTHER): Payer: Medicare Other | Admitting: Pharmacist

## 2013-01-10 DIAGNOSIS — I2699 Other pulmonary embolism without acute cor pulmonale: Secondary | ICD-10-CM

## 2013-01-10 DIAGNOSIS — Z7901 Long term (current) use of anticoagulants: Secondary | ICD-10-CM

## 2013-01-10 LAB — POCT INR: INR: 2.5

## 2013-01-10 NOTE — Progress Notes (Signed)
Anti-Coagulation Progress Note  Breanna Wilkins is a 52 y.o. female who is currently on an anti-coagulation regimen.    RECENT RESULTS: Recent results are below, the most recent result is correlated with a dose of 30 mg. per week: Lab Results  Component Value Date   INR 2.50 01/10/2013   INR 2.50 12/20/2012   INR 4.50 11/29/2012    ANTI-COAG DOSE: Anticoagulation Dose Instructions as of 01/10/2013     [name] Tue Wed Thu Fri Sat   New Dose 5 mg 0 mg 5 mg 5 mg 5 mg 5 mg 5 mg       ANTICOAG SUMMARY: Anticoagulation Episode Summary   Current INR goal 2.0-3.0   Next INR check 02/07/2013   INR from last check 2.50 (01/10/2013)   Weekly max dose    Target end date    [facility]   Preferred lab    Send INR reminders to       Comments         ANTICOAG TODAY: Anticoagulation Summary as of 01/10/2013   INR goal 2.0-3.0   Selected INR 2.50 (01/10/2013)   Next INR check 02/07/2013   Target end date     Anticoagulation Episode Summary   [facility]   Preferred lab    Send INR reminders to    Comments       PATIENT INSTRUCTIONS: Patient Instructions  Patient instructed to take medications as defined in the Anti-coagulation Track section of this encounter.  Patient instructed to take today's dose.  Patient verbalized understanding of these instructions.       FOLLOW-UP Return in 4 weeks (on 02/07/2013) for Follow up INR at 2:45PM.  Hulen Luster, III Pharm.D., CACP

## 2013-01-10 NOTE — Patient Instructions (Signed)
Patient instructed to take medications as defined in the Anti-coagulation Track section of this encounter.  Patient instructed to take today's dose.  Patient verbalized understanding of these instructions.    

## 2013-01-11 NOTE — Progress Notes (Signed)
I have read Dr. Saralyn Pilar note and agree with his plan.  Breanna Wilkins is on coumadin for VTE in the past (PE and DVT)

## 2013-02-07 ENCOUNTER — Ambulatory Visit (INDEPENDENT_AMBULATORY_CARE_PROVIDER_SITE_OTHER): Payer: Medicare Other | Admitting: Pharmacist

## 2013-02-07 DIAGNOSIS — Z7901 Long term (current) use of anticoagulants: Secondary | ICD-10-CM

## 2013-02-07 LAB — POCT INR: INR: 2.4

## 2013-02-07 NOTE — Patient Instructions (Signed)
Patient instructed to take medications as defined in the Anti-coagulation Track section of this encounter.  Patient instructed to take today's dose.  Patient verbalized understanding of these instructions.    

## 2013-02-07 NOTE — Progress Notes (Signed)
Anti-Coagulation Progress Note  Breanna Wilkins is a 52 y.o. female who is currently on an anti-coagulation regimen.    RECENT RESULTS: Recent results are below, the most recent result is correlated with a dose of 30 mg. per week: Lab Results  Component Value Date   INR 2.40 02/07/2013   INR 2.50 01/10/2013   INR 2.50 12/20/2012    ANTI-COAG DOSE: Anticoagulation Dose Instructions as of 02/07/2013     [name] Tue Wed Thu Fri Sat   New Dose 5 mg 5 mg 5 mg 5 mg 5 mg 5 mg 5 mg       ANTICOAG SUMMARY: Anticoagulation Episode Summary   Current INR goal 2.0-3.0   Next INR check 03/07/2013   INR from last check 2.40 (02/07/2013)   Weekly max dose    Target end date    [facility]   Preferred lab    Send INR reminders to       Comments         ANTICOAG TODAY: Anticoagulation Summary as of 02/07/2013   INR goal 2.0-3.0   Selected INR 2.40 (02/07/2013)   Next INR check 03/07/2013   Target end date     Anticoagulation Episode Summary   [facility]   Preferred lab    Send INR reminders to    Comments       PATIENT INSTRUCTIONS: Patient Instructions  Patient instructed to take medications as defined in the Anti-coagulation Track section of this encounter.  Patient instructed to take today's dose.  Patient verbalized understanding of these instructions.       FOLLOW-UP Return in 4 weeks (on 03/07/2013) for Follow up INR at 2:45PM.  Hulen Luster, III Pharm.D., CACP

## 2013-03-07 ENCOUNTER — Ambulatory Visit (INDEPENDENT_AMBULATORY_CARE_PROVIDER_SITE_OTHER): Payer: Medicare Other | Admitting: Pharmacist

## 2013-03-07 DIAGNOSIS — I2699 Other pulmonary embolism without acute cor pulmonale: Secondary | ICD-10-CM

## 2013-03-07 DIAGNOSIS — Z7901 Long term (current) use of anticoagulants: Secondary | ICD-10-CM

## 2013-03-07 LAB — POCT INR: INR: 3.1

## 2013-03-07 NOTE — Patient Instructions (Signed)
Patient instructed to take medications as defined in the Anti-coagulation Track section of this encounter.  Patient instructed to take today's dose.  Patient verbalized understanding of these instructions.    

## 2013-03-07 NOTE — Progress Notes (Signed)
Anti-Coagulation Progress Note  Breanna Wilkins is a 52 y.o. female who is currently on an anti-coagulation regimen.    RECENT RESULTS: Recent results are below, the most recent result is correlated with a dose of 35 mg. per week: Lab Results  Component Value Date   INR 3.10 03/07/2013   INR 2.40 02/07/2013   INR 2.50 01/10/2013    ANTI-COAG DOSE: Anticoagulation Dose Instructions as of 03/07/2013     [name] Tue Wed Thu Fri Sat   New Dose 5 mg 5 mg 5 mg 0 mg 5 mg 5 mg 5 mg       ANTICOAG SUMMARY: Anticoagulation Episode Summary   Current INR goal 2.0-3.0   Next INR check 03/28/2013   INR from last check 3.10! (03/07/2013)   Weekly max dose    Target end date    [facility]   Preferred lab    Send INR reminders to       Comments         ANTICOAG TODAY: Anticoagulation Summary as of 03/07/2013   INR goal 2.0-3.0   Selected INR 3.10! (03/07/2013)   Next INR check 03/28/2013   Target end date     Anticoagulation Episode Summary   IN[facility] check [facility]   Preferred lab    Send INR reminders to    Comments       PATIENT INSTRUCTIONS: Patient Instructions  Patient instructed to take medications as defined in the Anti-coagulation Track section of this encounter.  Patient instructed to take today's dose.  Patient verbalized understanding of these instructions.       FOLLOW-UP Return in 3 weeks (on 03/28/2013) for Follow up INR at 2:45PM.  Hulen Luster, III Pharm.D., CACP

## 2013-03-21 ENCOUNTER — Other Ambulatory Visit: Payer: Self-pay | Admitting: Internal Medicine

## 2013-03-28 ENCOUNTER — Ambulatory Visit (INDEPENDENT_AMBULATORY_CARE_PROVIDER_SITE_OTHER): Payer: Medicare Other | Admitting: Pharmacist

## 2013-03-28 DIAGNOSIS — I824Y9 Acute embolism and thrombosis of unspecified deep veins of unspecified proximal lower extremity: Secondary | ICD-10-CM

## 2013-03-28 DIAGNOSIS — Z7901 Long term (current) use of anticoagulants: Secondary | ICD-10-CM

## 2013-03-29 LAB — POCT INR: INR: 2.4

## 2013-03-29 NOTE — Progress Notes (Signed)
Anti-Coagulation Progress Note  Breanna Wilkins is a 52 y.o. female who is currently on an anti-coagulation regimen.    RECENT RESULTS: Recent results are below, the most recent result is correlated with a dose of 30 mg. per week: Lab Results  Component Value Date   INR 2.40 03/28/2013   INR 3.10 03/07/2013   INR 2.40 02/07/2013    ANTI-COAG DOSE: Anticoagulation Dose Instructions as of 03/28/2013     [name] Tue Wed Thu Fri Sat   New Dose 5 mg 5 mg 5 mg 5 mg 5 mg 5 mg 5 mg       ANTICOAG SUMMARY: Anticoagulation Episode Summary   Current INR goal 2.0-3.0   Next INR check 04/18/2013   INR from last check 2.40 (03/29/2013)   Most recent INR 2.40 (03/29/2013)   Weekly max dose    Target end date    [facility]   Preferred lab    Send INR reminders to       Comments         ANTICOAG TODAY: Anticoagulation Summary as of 03/28/2013   INR goal 2.0-3.0   Selected INR 2.40 (03/29/2013)   Next INR check 04/18/2013   Target end date     Anticoagulation Episode Summary   [facility]   Preferred lab    Send INR reminders to    Comments       PATIENT INSTRUCTIONS: Patient Instructions  Patient instructed to take medications as defined in the Anti-coagulation Track section of this encounter.  Patient instructed to take today's dose.  Patient verbalized understanding of these instructions.       FOLLOW-UP Return in 3 weeks (on 04/18/2013) for Follow up INR at 3:15pm.  Hulen Luster, III Pharm.D., CACP

## 2013-03-29 NOTE — Patient Instructions (Signed)
Patient instructed to take medications as defined in the Anti-coagulation Track section of this encounter.  Patient instructed to take today's dose.  Patient verbalized understanding of these instructions.    

## 2013-03-30 NOTE — Progress Notes (Signed)
Reason for anticoagulation, PE and DVT.  I Have reviewed Dr. Saralyn Pilar note.

## 2013-04-07 ENCOUNTER — Encounter: Payer: Self-pay | Admitting: Internal Medicine

## 2013-04-07 ENCOUNTER — Ambulatory Visit (INDEPENDENT_AMBULATORY_CARE_PROVIDER_SITE_OTHER): Payer: Medicare Other | Admitting: Internal Medicine

## 2013-04-07 VITALS — BP 120/80 | HR 79 | Temp 96.8°F | Ht 67.0 in | Wt 295.7 lb

## 2013-04-07 DIAGNOSIS — I509 Heart failure, unspecified: Secondary | ICD-10-CM

## 2013-04-07 DIAGNOSIS — G4733 Obstructive sleep apnea (adult) (pediatric): Secondary | ICD-10-CM

## 2013-04-07 DIAGNOSIS — IMO0002 Reserved for concepts with insufficient information to code with codable children: Secondary | ICD-10-CM

## 2013-04-07 DIAGNOSIS — G4700 Insomnia, unspecified: Secondary | ICD-10-CM

## 2013-04-07 DIAGNOSIS — I10 Essential (primary) hypertension: Secondary | ICD-10-CM

## 2013-04-07 MED ORDER — PANTOPRAZOLE SODIUM 40 MG PO TBEC: DELAYED_RELEASE_TABLET | ORAL | Status: DC

## 2013-04-07 MED ORDER — FUROSEMIDE 80 MG PO TABS: 80.0000 mg | ORAL_TABLET | Freq: Two times a day (BID) | ORAL | Status: DC

## 2013-04-07 MED ORDER — HYDROCODONE-ACETAMINOPHEN 5-325 MG PO TABS: 1.0000 | ORAL_TABLET | Freq: Four times a day (QID) | ORAL | Status: DC | PRN

## 2013-04-07 NOTE — Assessment & Plan Note (Addendum)
Patient has trace Lower extremity edema today.  Denies any SOB or increased orthopnea.  Will continue current medications, Coreg, Lasix, Pravastatin, ASA.

## 2013-04-07 NOTE — Progress Notes (Signed)
Patient ID: Breanna Wilkins, female   DOB: 1961/06/11, 52 y.o.   MRN: [id number]   Subjective:   Patient ID: Breanna Wilkins female   DOB: Feb 26, 1961 52 y.o.   MRN: [id number]  HPI: Breanna Wilkins is a 52 y.o. femalewith a PMH of systolic CHF, DVT on long-term anticoagulation, CAD, HTN, OSA. She presents today for regular followup. She reports overall she is tolerating her medications and feels okay. Her main concern today is her chronic right shoulder pain, she does not wish for knee surgery to repair her supraspinatus tear. She was referred at the last visit to orthopedic surgery where she received a joint injection and she reported that that was very helpful. Her pain has increased again and she reports tramadol is ineffective. In addition she believes to tramadol has caused her some mild headaches which typically lasts less than one hour. She reports these happen almost daily.  She denies any visual or sensory disturbances associated with headaches. Reports they're mostly located in the frontal region.    Past Medical History  Diagnosis Date  . Shoulder pain   . Pulmonary embolus 01/16/10    on Coumadin indefinitely  . Abdominal hernia   . Hypertension   . Overweight(278.02)   . Lung nodule 12/2009    Very small, left upper lobe by CT June, 2011, one-year followup was stable  . Shortness of breath   . Warfarin anticoagulation   . Ejection fraction     65-70%, vigorous function, echo, March, 2011  . CHF (congestive heart failure)   . OSA (obstructive sleep apnea)     CPAP  . CAD (coronary artery disease)     Perioperative non-STEMI March, 2014  //   cardiac catheterization at time of non-STEMI, March, 2014, minor nonobstructive coronary disease, vigorous LV function, possibly vasospastic event versus stress cardiomyopathy. No further workup of coronary disease  . Ejection fraction     Vigorous function at time of catheterization October 06, 2012,   Current Outpatient  Prescriptions  Medication Sig Dispense Refill  . carvedilol (COREG) 6.25 MG tablet Take 1 tablet (6.25 mg total) by mouth 2 (two) times daily with a meal.  60 tablet  11  . furosemide (LASIX) 80 MG tablet Take 1 tablet (80 mg total) by mouth 2 (two) times daily.  60 tablet  3  . pravastatin (PRAVACHOL) 40 MG tablet Take 1 tablet (40 mg total) by mouth at bedtime.  30 tablet  11  . traMADol (ULTRAM) 50 MG tablet Take 1 tablet (50 mg total) by mouth every 8 (eight) hours as needed for pain.  90 tablet  2  . warfarin (COUMADIN) 10 MG tablet Take 1 tablet on M/W/F; take 1/2 tablet on all other days.  50 tablet  2  . aspirin 81 MG chewable tablet Chew 1 tablet (81 mg total) by mouth daily.  30 tablet  1  . HYDROcodone-acetaminophen (NORCO/VICODIN) 5-325 MG per tablet Take 1 tablet by mouth every 6 (six) hours as needed for pain.  60 tablet  0  . pantoprazole (PROTONIX) 40 MG tablet Take 1 tablet 30 minutes before meal (lunch/dinner)  30 tablet  3  . potassium chloride (K-DUR,KLOR-CON) 20 MEQ tablet Take 1 tablet (20 mEq total) by mouth daily.  30 tablet  6   No current facility-administered medications for this visit.   Family History  Problem Relation Age of Onset  . Diabetes Father   . Diabetes Sister   . Diabetes Mother   .  Deep vein thrombosis Sister   . Ovarian cancer Mother   . Bone cancer Maternal Grandmother    History   Social History  . Marital Status: Single    Spouse Name: N/A    Number of Children: N/A  . Years of Education: N/A   Social History Main Topics  . Smoking status: Former Games developer  . Smokeless tobacco: None     Comment: qoit 12 years ago  . Alcohol Use: 3.6 oz/week    0 Drinks containing 0.5 oz of alcohol, 6 Cans of beer per week     Comment: 6 pack of beer  . Drug Use: No     Comment: smoked joint 2 weeks ago  . Sexual Activity: None   Other Topics Concern  . None   Social History Narrative   Financial assistance approved for 100% discount at [facility] and  has [facility] card per [employer]   12/25/2009   Review of Systems: Review of Systems  Constitutional: Negative for fever, chills, weight loss and malaise/fatigue.  HENT: Negative for congestion.   Eyes: Negative for blurred vision, double vision and photophobia.  Respiratory: Negative for cough, sputum production and shortness of breath.   Cardiovascular: Negative for chest pain, palpitations and leg swelling.  Gastrointestinal: Negative for heartburn, nausea, vomiting, abdominal pain, diarrhea and constipation.  Genitourinary: Negative for dysuria.  Musculoskeletal: Positive for joint pain. Negative for falls.  Skin: Negative for rash.  Neurological: Positive for headaches. Negative for dizziness and sensory change.  Psychiatric/Behavioral: Negative for depression.    Objective:  Physical Exam: Filed Vitals:   04/07/13 1353  BP: 120/80  Pulse: 79  Temp: 96.8 F (36 C)  TempSrc: Oral  Height: 5\' 7"  (1.702 m)  Weight: 295 lb 11.2 oz (134.129 kg)  SpO2: 98%  Physical Exam  Nursing note and vitals reviewed. Constitutional: She is well-developed, well-nourished, and in no distress. No distress.  HENT:  Head: Normocephalic and atraumatic.  Eyes: Conjunctivae are normal.  Cardiovascular: Normal rate, regular rhythm, normal heart sounds and intact distal pulses.   No murmur heard. Pulmonary/Chest: Effort normal and breath sounds normal. No respiratory distress. She has no wheezes. She has no rales.  Abdominal: Soft. Bowel sounds are normal. She exhibits no distension. There is no tenderness.  Musculoskeletal: She exhibits edema (trace).       Right shoulder: She exhibits decreased range of motion (cannot lift above 85 degrees) and pain.  Skin: Skin is warm and dry. She is not diaphoretic.  Psychiatric: Affect and judgment normal.     Assessment & Plan:   See Problem Based Assessment and Plan Meds ordered this encounter  Medications  . furosemide (LASIX) 80 MG tablet    Sig:  Take 1 tablet (80 mg total) by mouth 2 (two) times daily.    Dispense:  60 tablet    Refill:  3  . pantoprazole (PROTONIX) 40 MG tablet    Sig: Take 1 tablet 30 minutes before meal (lunch/dinner)    Dispense:  30 tablet    Refill:  3  . HYDROcodone-acetaminophen (NORCO/VICODIN) 5-325 MG per tablet    Sig: Take 1 tablet by mouth every 6 (six) hours as needed for pain.    Dispense:  60 tablet    Refill:  0

## 2013-04-07 NOTE — Assessment & Plan Note (Addendum)
After patient's complications of surgery in the past she is very fearful of any surgery. She does have inability to adduct her right arm above 90 and reports chronic pain. She reports that tramadol has been ineffective for the pain. She did receive relief 2 months ago with a joint injection. We'll prescribe her a limited supply of Percocet today and refer to receive another injection. If she continues to the pain medication will need to sign a pain contract.

## 2013-04-07 NOTE — Assessment & Plan Note (Addendum)
Patient reports she is waking during night and having trouble falling back asleep.  She reports daytime somnolence, increased fatigue. She has not been using her CPAP. Patient instructed to use CPAP for the next month and if it is not improved will consider other sleep medications. Also checking a TSH and free T4 today.

## 2013-04-07 NOTE — Assessment & Plan Note (Signed)
BP Readings from Last 3 Encounters:  04/07/13 120/80  12/20/12 128/85  12/07/12 135/82    Lab Results  Component Value Date   NA 139 12/07/2012   K 3.5 12/07/2012   CREATININE 0.85 12/07/2012    Assessment: Blood pressure control: controlled Progress toward BP goal:  at goal Comments:   Plan: Medications:  Lasix 80mg  in AM, 40mg  in PM, Coreg 6.25 BID Educational resources provided: brochure Self management tools provided:   Other plans: Well controlled continue current medications

## 2013-04-07 NOTE — Patient Instructions (Signed)
1.  Continue to take you medications as prescribed (you can continue with 1 tablet of Lasix in the morning and 1/2 tablet at night). 2.  I will call you if you have any lab abnormalities, otherwise we can review them at our next visit. 3.  Remember to use CPAP at night.

## 2013-04-08 LAB — BASIC METABOLIC PANEL WITH GFR
Calcium: 9.5 mg/dL (ref 8.4–10.5)
GFR, Est African American: >89 mL/min
GFR, Est Non African American: 82 mL/min
Glucose, Bld: 103 mg/dL — ABNORMAL HIGH (ref 70–99)
Potassium: 3.3 mEq/L — ABNORMAL LOW (ref 3.5–5.3)
Sodium: 140 mEq/L (ref 135–145)

## 2013-04-08 NOTE — Progress Notes (Signed)
I saw and evaluated the patient.  I personally confirmed the key portions of the history and exam documented by Dr. Hoffman and I reviewed pertinent patient test results.  The assessment, diagnosis, and plan were formulated together and I agree with the documentation in the resident's note. 

## 2013-04-18 ENCOUNTER — Ambulatory Visit (INDEPENDENT_AMBULATORY_CARE_PROVIDER_SITE_OTHER): Payer: Medicare Other | Admitting: Pharmacist

## 2013-04-18 DIAGNOSIS — Z7901 Long term (current) use of anticoagulants: Secondary | ICD-10-CM

## 2013-04-18 LAB — POCT INR: INR: 2.7

## 2013-04-18 NOTE — Progress Notes (Signed)
Anti-Coagulation Progress Note  MARILEA GWYNNE is a 52 y.o. female who is currently on an anti-coagulation regimen.    RECENT RESULTS: Recent results are below, the most recent result is correlated with a dose of 35 mg. per week: Lab Results  Component Value Date   INR 2.70 04/18/2013   INR 2.40 03/28/2013   INR 3.10 03/07/2013    ANTI-COAG DOSE: Anticoagulation Dose Instructions as of 04/18/2013     [name] Tue Wed Thu Fri Sat   New Dose 5 mg 5 mg 5 mg 5 mg 5 mg 5 mg 5 mg       ANTICOAG SUMMARY: Anticoagulation Episode Summary   Current INR goal 2.0-3.0   Next INR check 05/09/2013   INR from last check 2.70 (04/18/2013)   Weekly max dose    Target end date    [facility]   Preferred lab    Send INR reminders to       Comments         ANTICOAG TODAY: Anticoagulation Summary as of 04/18/2013   INR goal 2.0-3.0   Selected INR 2.70 (04/18/2013)   Next INR check 05/09/2013   Target end date     Anticoagulation Episode Summary   [facility]   Preferred lab    Send INR reminders to    Comments       PATIENT INSTRUCTIONS: Patient Instructions  Patient instructed to take medications as defined in the Anti-coagulation Track section of this encounter.  Patient instructed to take today's dose.  Patient verbalized understanding of these instructions.       FOLLOW-UP Return in 3 weeks (on 05/09/2013) for Follow up INR at 3PM.  Hulen Luster, III Pharm.D., CACP

## 2013-04-18 NOTE — Patient Instructions (Signed)
Patient instructed to take medications as defined in the Anti-coagulation Track section of this encounter.  Patient instructed to take today's dose.  Patient verbalized understanding of these instructions.    

## 2013-04-21 ENCOUNTER — Telehealth: Payer: Self-pay | Admitting: *Deleted

## 2013-04-21 NOTE — Telephone Encounter (Signed)
Call to pt, female answered message left to have pt call the Clinics.  Pt has an appointment with [facility] - Dr. Shelle Iron on 05/03/2013 at 3:30 PM arrive by 3:00 PM.  Angelina Ok, RN 04/21/2013 11:47 AM.

## 2013-04-29 ENCOUNTER — Encounter: Payer: Self-pay | Admitting: Cardiology

## 2013-04-29 ENCOUNTER — Ambulatory Visit (INDEPENDENT_AMBULATORY_CARE_PROVIDER_SITE_OTHER): Payer: Medicare Other | Admitting: Cardiology

## 2013-04-29 VITALS — BP 134/64 | HR 83 | Ht 67.0 in | Wt 301.8 lb

## 2013-04-29 DIAGNOSIS — G4700 Insomnia, unspecified: Secondary | ICD-10-CM

## 2013-04-29 DIAGNOSIS — R0602 Shortness of breath: Secondary | ICD-10-CM

## 2013-04-29 DIAGNOSIS — I2510 Atherosclerotic heart disease of native coronary artery without angina pectoris: Secondary | ICD-10-CM

## 2013-04-29 DIAGNOSIS — I10 Essential (primary) hypertension: Secondary | ICD-10-CM

## 2013-04-29 NOTE — Progress Notes (Signed)
HPI  Patient is seen for followup cardiac status. Earlier in the year she had a prolonged hospitalization after having surgery for an abdominal hernia and bowel abnormalities. Cardiac enzymes were elevated leading to cardiac catheterization. She had very minor nonobstructive coronary disease. LV function was vigorous. He was felt that she may have had some spasm or stress cardiomyopathy that rapidly improved. I saw her in April and she was stable.  Currently she has some exertional shortness of breath. She does not have PND or orthopnea. She has gained some additional weight. She has decreased sleeping.  Allergies  Allergen Reactions  . Aspirin     REACTION: Nausea (325mg )    Current Outpatient Prescriptions  Medication Sig Dispense Refill  . aspirin 81 MG chewable tablet Chew 1 tablet (81 mg total) by mouth daily.  30 tablet  1  . carvedilol (COREG) 6.25 MG tablet Take 1 tablet (6.25 mg total) by mouth 2 (two) times daily with a meal.  60 tablet  11  . Cyanocobalamin (VITAMIN B 12 PO) Take by mouth.      . furosemide (LASIX) 80 MG tablet Take 1 tablet (80 mg total) by mouth 2 (two) times daily.  60 tablet  3  . pantoprazole (PROTONIX) 40 MG tablet Take 1 tablet 30 minutes before meal (lunch/dinner)  30 tablet  3  . potassium chloride (K-DUR,KLOR-CON) 20 MEQ tablet Take 1 tablet (20 mEq total) by mouth daily.  30 tablet  6  . pravastatin (PRAVACHOL) 40 MG tablet Take 1 tablet (40 mg total) by mouth at bedtime.  30 tablet  11  . traMADol (ULTRAM) 50 MG tablet Take 1 tablet (50 mg total) by mouth every 8 (eight) hours as needed for pain.  90 tablet  2  . warfarin (COUMADIN) 10 MG tablet Take 1 tablet on M/W/F; take 1/2 tablet on all other days.  50 tablet  2   No current facility-administered medications for this visit.    History   Social History  . Marital Status: Single    Spouse Name: N/A    Number of Children: N/A  . Years of Education: N/A   Occupational History  . Not on  file.   Social History Main Topics  . Smoking status: Former Games developer  . Smokeless tobacco: Not on file     Comment: qoit 12 years ago  . Alcohol Use: 3.6 oz/week    0 Drinks containing 0.5 oz of alcohol, 6 Cans of beer per week     Comment: 6 pack of beer  . Drug Use: No     Comment: smoked joint 2 weeks ago  . Sexual Activity: Not on file   Other Topics Concern  . Not on file   Social History Narrative   Financial assistance approved for 100% discount at [facility] and has [facility] card per Rudell Cobb   12/25/2009    Family History  Problem Relation Age of Onset  . Diabetes Father   . Diabetes Sister   . Diabetes Mother   . Deep vein thrombosis Sister   . Ovarian cancer Mother   . Bone cancer Maternal Grandmother     Past Medical History  Diagnosis Date  . Shoulder pain   . Pulmonary embolus 01/16/10    on Coumadin indefinitely  . Abdominal hernia   . Hypertension   . Overweight(278.02)   . Lung nodule 12/2009    Very small, left upper lobe by CT June, 2011, one-year followup was stable  .  Shortness of breath   . Warfarin anticoagulation   . Ejection fraction     65-70%, vigorous function, echo, March, 2011  . CHF (congestive heart failure)   . OSA (obstructive sleep apnea)     CPAP  . CAD (coronary artery disease)     Perioperative non-STEMI March, 2014  //   cardiac catheterization at time of non-STEMI, March, 2014, minor nonobstructive coronary disease, vigorous LV function, possibly vasospastic event versus stress cardiomyopathy. No further workup of coronary disease  . Ejection fraction     Vigorous function at time of catheterization October 06, 2012,    Past Surgical History  Procedure Laterality Date  . Abdominal hysterectomy  07/2002    laparoscopic assisted vaginal hysterectomy for menorrhagia, dysmenorrhea, anemia, fibroids  . Knee arthroscopy Left   . Incisional hernia repair N/A 09/27/2012    Procedure: LAPAROSCOPIC INCISIONAL HERNIA possible open;  Surgeon:  Ernestene Mention, MD;  Location: [facility] OR;  Service: General;  Laterality: N/A;  . Insertion of mesh N/A 09/27/2012    Procedure: INSERTION OF MESH;  Surgeon: Ernestene Mention, MD;  Location: [facility] OR;  Service: General;  Laterality: N/A;    Patient Active Problem List   Diagnosis Date Noted  . CAD (coronary artery disease)   . ST elevation myocardial infarction (STEMI) of lateral wall 10/11/2012  . CHF NYHA class II (symptoms with moderately strenuous activities) 10/11/2012  . Ventral hernia with obstruction 09/27/2012  . Other and unspecified hyperlipidemia 08/31/2012  . Obesity 04/12/2012  . Ejection fraction   . Popliteal DVT (deep venous thrombosis) 03/20/2011  . Long-term (current) use of anticoagulants 08/09/2010  . Supraspinatus tendon tear 02/11/2010  . Pulmonary embolus 01/16/2010  . Lung nodule 12/19/2009  . OBSTRUCTIVE SLEEP APNEA 09/19/2009  . Essential hypertension, benign 06/04/2009    ROS   Patient denies fever, chills, headache, sweats, rash, change in vision, change in hearing, chest pain, cough, nausea vomiting, urinary symptoms. All other systems are reviewed and are negative.  PHYSICAL EXAM  Patient is overweight. She is oriented to person time and place. Affect is normal. There is no jugulovenous distention. Lungs are clear. Respiratory effort is nonlabored. Cardiac exam reveals S1 and S2. There no clicks or significant murmurs. Abdomen is soft. There is no peripheral edema.  Filed Vitals:   04/29/13 1107  BP: 134/64  Pulse: 83  Height: 5\' 7"  (1.702 m)  Weight: 301 lb 12.8 oz (136.896 kg)  SpO2: 98%     ASSESSMENT & PLAN

## 2013-04-29 NOTE — Assessment & Plan Note (Signed)
Blood pressures control. No change in therapy. 

## 2013-04-29 NOTE — Assessment & Plan Note (Signed)
It will be safe for her to try some benadryl.

## 2013-04-29 NOTE — Patient Instructions (Signed)
Your physician has recommended you make the following change in your medication: you may take over the counter Benadryl   Your physician wants you to follow-up in: 1 year. You will receive a reminder letter in the mail two months in advance. If you don't receive a letter, please call our office to schedule the follow-up appointment.

## 2013-04-29 NOTE — Assessment & Plan Note (Signed)
At this time I feel her shortness of breath is not cardiac. No further workup.

## 2013-04-29 NOTE — Assessment & Plan Note (Signed)
Coronary disease is mild. No further workup.

## 2013-05-09 ENCOUNTER — Ambulatory Visit (INDEPENDENT_AMBULATORY_CARE_PROVIDER_SITE_OTHER): Payer: Medicare Other | Admitting: Pharmacist

## 2013-05-09 DIAGNOSIS — Z7901 Long term (current) use of anticoagulants: Secondary | ICD-10-CM

## 2013-05-09 LAB — POCT INR: INR: 3.4

## 2013-05-09 NOTE — Patient Instructions (Signed)
Patient instructed to take medications as defined in the Anti-coagulation Track section of this encounter.  Patient instructed to take today's dose.  Patient verbalized understanding of these instructions.    

## 2013-05-09 NOTE — Progress Notes (Signed)
Anti-Coagulation Progress Note  Breanna Wilkins is a 52 y.o. female who is currently on an anti-coagulation regimen.    RECENT RESULTS: Recent results are below, the most recent result is correlated with a dose of 35 mg. per week: Lab Results  Component Value Date   INR 3.40 05/09/2013   INR 2.70 04/18/2013   INR 2.40 03/28/2013    ANTI-COAG DOSE: Anticoagulation Dose Instructions as of 05/09/2013     [name] Tue Wed Thu Fri Sat   New Dose 5 mg 0 mg 5 mg 5 mg 5 mg 5 mg 5 mg       ANTICOAG SUMMARY: Anticoagulation Episode Summary   Current INR goal 2.0-3.0   Next INR check 05/30/2013   INR from last check 3.40! (05/09/2013)   Weekly max dose    Target end date    [facility]   Preferred lab    Send INR reminders to       Comments         ANTICOAG TODAY: Anticoagulation Summary as of 05/09/2013   INR goal 2.0-3.0   Selected INR 3.40! (05/09/2013)   Next INR check 05/30/2013   Target end date     Anticoagulation Episode Summary   [facility]   Preferred lab    Send INR reminders to    Comments       PATIENT INSTRUCTIONS: Patient Instructions  Patient instructed to take medications as defined in the Anti-coagulation Track section of this encounter.  Patient instructed to take today's dose.  Patient verbalized understanding of these instructions.       FOLLOW-UP Return in about 3 weeks (around 05/30/2013) for Follow up INR at 2PM.  Hulen Luster, III Pharm.D., CACP

## 2013-05-12 ENCOUNTER — Ambulatory Visit (INDEPENDENT_AMBULATORY_CARE_PROVIDER_SITE_OTHER): Payer: Medicare Other | Admitting: Internal Medicine

## 2013-05-12 ENCOUNTER — Encounter: Payer: Self-pay | Admitting: Internal Medicine

## 2013-05-12 VITALS — BP 133/87 | HR 86 | Temp 97.1°F | Ht 67.0 in | Wt 298.1 lb

## 2013-05-12 DIAGNOSIS — I10 Essential (primary) hypertension: Secondary | ICD-10-CM

## 2013-05-12 DIAGNOSIS — G4700 Insomnia, unspecified: Secondary | ICD-10-CM

## 2013-05-12 DIAGNOSIS — G4733 Obstructive sleep apnea (adult) (pediatric): Secondary | ICD-10-CM

## 2013-05-12 DIAGNOSIS — Z0000 Encounter for general adult medical examination without abnormal findings: Secondary | ICD-10-CM | POA: Insufficient documentation

## 2013-05-12 DIAGNOSIS — I509 Heart failure, unspecified: Secondary | ICD-10-CM

## 2013-05-12 DIAGNOSIS — Z5189 Encounter for other specified aftercare: Secondary | ICD-10-CM

## 2013-05-12 DIAGNOSIS — M25519 Pain in unspecified shoulder: Secondary | ICD-10-CM

## 2013-05-12 MED ORDER — HYDROCODONE-ACETAMINOPHEN 7.5-325 MG PO TABS: 1.0000 | ORAL_TABLET | Freq: Four times a day (QID) | ORAL | Status: DC | PRN

## 2013-05-12 MED ORDER — LISINOPRIL 10 MG PO TABS: 10.0000 mg | ORAL_TABLET | Freq: Every day | ORAL | Status: DC

## 2013-05-12 MED ORDER — FUROSEMIDE 80 MG PO TABS: 80.0000 mg | ORAL_TABLET | Freq: Every day | ORAL | Status: DC

## 2013-05-12 NOTE — Assessment & Plan Note (Addendum)
BP Readings from Last 3 Encounters:  05/12/13 133/87  04/29/13 134/64  04/07/13 120/80    Lab Results  Component Value Date   NA 140 04/07/2013   K 3.3* 04/07/2013   CREATININE 0.82 04/07/2013    Assessment: Blood pressure control: controlled Progress toward BP goal:  at goal Comments:   Plan: Medications:  Lasix 80mg  QAM, Coreg 6.25 BID, Lisinopril 10mg  daily Educational resources provided: brochure Self management tools provided:   Other plans: Patient has good reported EF from last cath, does not appear volume overloaded from taking lasix 80mg  only in AM.  Will add lisinopril due to her history of CHF and MI.  Will have patient return in 2-3 weeks for BMP. Marland Kitchen

## 2013-05-12 NOTE — Assessment & Plan Note (Signed)
Patient reports she has been using her CPAP more lately, however does not feel her mask is a good fit. [facility].

## 2013-05-12 NOTE — Progress Notes (Signed)
Patient ID: MAKYNZEE TIGGES, female   DOB: 06-02-1961, 52 y.o.   MRN: [id number]   Subjective:   Patient ID: LENORIA NARINE female   DOB: 03/09/1961 52 y.o.   MRN: [id number]  HPI: Ms.Soo D Mignogna is a 52 y.o. female with a PMH of Systolic CHF, MI, PE, OSA, HTN, Right Supraspinatus tear.  She presents today for 1 month follow up.  Patient reports she has seen her orthopedist and received another injection into her right shoulder, due to her complications with a previous surgery she does not wish to pursue surgery.  She reports that the injection has not helped as much as the previous one she got.  She has been taking 1-2 norco tablets a day for pain and has only provided minimal relief. She has been using her CPAP at night which has helped with her insomnia and fatigue but reports the mask is not a good fit. She did see her cardiologist and plans to follow up in 1 year. She has no other complaints at this time.     Past Medical History  Diagnosis Date  . Shoulder pain   . Pulmonary embolus 01/16/10    on Coumadin indefinitely  . Abdominal hernia   . Hypertension   . Overweight(278.02)   . Lung nodule 12/2009    Very small, left upper lobe by CT June, 2011, one-year followup was stable  . Shortness of breath   . Warfarin anticoagulation   . Ejection fraction     65-70%, vigorous function, echo, March, 2011  . CHF (congestive heart failure)   . OSA (obstructive sleep apnea)     CPAP  . CAD (coronary artery disease)     Perioperative non-STEMI March, 2014  //   cardiac catheterization at time of non-STEMI, March, 2014, minor nonobstructive coronary disease, vigorous LV function, possibly vasospastic event versus stress cardiomyopathy. No further workup of coronary disease  . Ejection fraction     Vigorous function at time of catheterization October 06, 2012,   Current Outpatient Prescriptions  Medication Sig Dispense Refill  . aspirin 81 MG chewable tablet Chew 1 tablet (81 mg  total) by mouth daily.  30 tablet  1  . carvedilol (COREG) 6.25 MG tablet Take 1 tablet (6.25 mg total) by mouth 2 (two) times daily with a meal.  60 tablet  11  . Cyanocobalamin (VITAMIN B 12 PO) Take by mouth.      . furosemide (LASIX) 80 MG tablet Take 1 tablet (80 mg total) by mouth daily.  90 tablet  3  . HYDROcodone-acetaminophen (NORCO) 7.5-325 MG per tablet Take 1 tablet by mouth every 6 (six) hours as needed for pain.  45 tablet  0  . lisinopril (PRINIVIL,ZESTRIL) 10 MG tablet Take 1 tablet (10 mg total) by mouth daily.  90 tablet  3  . pantoprazole (PROTONIX) 40 MG tablet Take 1 tablet 30 minutes before meal (lunch/dinner)  30 tablet  3  . potassium chloride (K-DUR,KLOR-CON) 20 MEQ tablet Take 1 tablet (20 mEq total) by mouth daily.  30 tablet  6  . pravastatin (PRAVACHOL) 40 MG tablet Take 1 tablet (40 mg total) by mouth at bedtime.  30 tablet  11  . traMADol (ULTRAM) 50 MG tablet Take 1 tablet (50 mg total) by mouth every 8 (eight) hours as needed for pain.  90 tablet  2  . warfarin (COUMADIN) 10 MG tablet Take 1 tablet on M/W/F; take 1/2 tablet on all other days.  50  tablet  2   No current facility-administered medications for this visit.   Family History  Problem Relation Age of Onset  . Diabetes Father   . Diabetes Sister   . Diabetes Mother   . Deep vein thrombosis Sister   . Ovarian cancer Mother   . Bone cancer Maternal Grandmother    History   Social History  . Marital Status: Single    Spouse Name: N/A    Number of Children: N/A  . Years of Education: N/A   Social History Main Topics  . Smoking status: Former Games developer  . Smokeless tobacco: None     Comment: qoit 12 years ago  . Alcohol Use: 3.6 oz/week    0 Drinks containing 0.5 oz of alcohol, 6 Cans of beer per week     Comment: 6 pack of beer  . Drug Use: No     Comment: smoked joint 2 weeks ago  . Sexual Activity: None   Other Topics Concern  . None   Social History Narrative   Financial assistance  approved for 100% discount at [facility] and has [facility] card per [employer]   12/25/2009   Review of Systems: Review of Systems  Constitutional: Negative for fever, chills, weight loss and malaise/fatigue.  HENT: Negative for congestion.   Eyes: Negative for blurred vision, double vision and photophobia.  Respiratory: Negative for cough, sputum production and shortness of breath.   Cardiovascular: Negative for chest pain, palpitations and leg swelling.  Gastrointestinal: Negative for heartburn, nausea, vomiting, abdominal pain, diarrhea and constipation.  Genitourinary: Negative for dysuria.  Musculoskeletal: Positive for joint pain. Negative for falls.  Skin: Negative for rash.  Neurological: Positive for headaches. Negative for dizziness and sensory change.  Psychiatric/Behavioral: Negative for depression. The patient has insomnia.       Objective:  Physical Exam: Filed Vitals:   05/12/13 1434  BP: 133/87  Pulse: 86  Temp: 97.1 F (36.2 C)  TempSrc: Oral  Height: 5\' 7"  (1.702 m)  Weight: 298 lb 1.6 oz (135.217 kg)  SpO2: 97%  Physical Exam  Nursing note and vitals reviewed. Constitutional: She is well-developed, well-nourished, and in no distress. No distress.  HENT:  Head: Normocephalic and atraumatic.  Eyes: Conjunctivae are normal.  Cardiovascular: Normal rate, regular rhythm, normal heart sounds and intact distal pulses.   No murmur heard. Pulmonary/Chest: Effort normal and breath sounds normal. No respiratory distress. She has no wheezes. She has no rales.  Abdominal: Soft. Bowel sounds are normal. She exhibits no distension. There is no tenderness.  Musculoskeletal: She exhibits edema (trace).       Right shoulder: She exhibits decreased range of motion (cannot lift above 90 degrees), tenderness and pain.  Skin: Skin is warm and dry. She is not diaphoretic.  Psychiatric: Affect and judgment normal.    Assessment & Plan:   See Problem Based Assessment and Plan Meds  ordered this encounter  Medications  . furosemide (LASIX) 80 MG tablet    Sig: Take 1 tablet (80 mg total) by mouth daily.    Dispense:  90 tablet    Refill:  3  . lisinopril (PRINIVIL,ZESTRIL) 10 MG tablet    Sig: Take 1 tablet (10 mg total) by mouth daily.    Dispense:  90 tablet    Refill:  3  . HYDROcodone-acetaminophen (NORCO) 7.5-325 MG per tablet    Sig: Take 1 tablet by mouth every 6 (six) hours as needed for pain.    Dispense:  45  tablet    Refill:  0    No orders of the defined types were placed in this encounter.

## 2013-05-12 NOTE — Patient Instructions (Signed)
1.  Start taking Lisinopril 10mg  every day. 2.  Please call and leave me a message about what pill the "green pill" is. 3.  Try taking the stronger pain medication- Norco 7.5-325. 4.  Please look up Pneumovax, I think it would be a good idea for you to get this vaccination.

## 2013-05-12 NOTE — Assessment & Plan Note (Signed)
-  Recommended Flu vaccine- refused. -Recommended Pneumovax vaccine, patient reports she will research this vaccine and we can further discuss it at next visit.

## 2013-05-12 NOTE — Assessment & Plan Note (Addendum)
Patient reports that she has taken 1-2 Norco 5-325 a day and has only minimally reduced her right shoulder pain.  She reports she did see the orthopedist and received an injection but it does not seem to be helping as much as her previous injection did.  As patient has a history of CHF I am concerned about prescribing NSAIDs.  I will increase her dose of Norco to 7.5-325 #45.  If narcotic pain medications fail to relieve pain we may consider Tylenol #3 in the future.

## 2013-05-12 NOTE — Assessment & Plan Note (Signed)
Patient with history of systolic CHF however cath in march showed improvement in systolic function. Patient has been taking only 80mg  Lasix once a day for the last week, she has lost 2 lbs since last visit and only has trace pedal edema.  She has no SOB.   -Continue lasix at 80mg  daily -Start lisinopril 10mg  daily -Will have patient back in 2-3 weeks for F/U BMP, she may be able to stop taking KCl supplements now that she is taking lisinopril. -Continue Coreg 6.25 BID -Continue ASA  -Continue pravastatin 40mg  daily

## 2013-05-16 NOTE — Progress Notes (Signed)
I saw and evaluated the patient.  I personally confirmed the key portions of Dr. Neita Garnet history and exam and reviewed pertinent patient test results.  The assessment, diagnosis, and plan were formulated together and I agree with the documentation in the resident's note.  If she does not respond to an adequate dose of narcotics for her shoulder in terms of pain relief and function we will discontinue it in the future.  If she is responsive symptomatically and functionally we will document this positive response and continue the medication.

## 2013-05-19 ENCOUNTER — Other Ambulatory Visit: Payer: Self-pay | Admitting: *Deleted

## 2013-05-19 ENCOUNTER — Telehealth: Payer: Self-pay | Admitting: *Deleted

## 2013-05-19 NOTE — Telephone Encounter (Signed)
Pt called and is confused about what medications to take.  She was supposed to stop one pill but she can't remember which one.   She thinks it's potassium she needs to stop or should she continue until she returns for  BMET recheck in a few weeks. I did review your notes and read her the list of what to take.  Lisinopril, coreg, asa and pravastatin. Pt # [id number]

## 2013-05-19 NOTE — Telephone Encounter (Signed)
reviewed

## 2013-05-24 NOTE — Telephone Encounter (Signed)
Pt informed and voices understanding 

## 2013-05-24 NOTE — Telephone Encounter (Signed)
Sounds great. She should not need to take potassium supplements.

## 2013-05-30 ENCOUNTER — Ambulatory Visit (INDEPENDENT_AMBULATORY_CARE_PROVIDER_SITE_OTHER): Payer: Medicare Other | Admitting: Pharmacist

## 2013-05-30 DIAGNOSIS — Z7901 Long term (current) use of anticoagulants: Secondary | ICD-10-CM

## 2013-05-30 LAB — POCT INR: INR: 2

## 2013-05-30 NOTE — Patient Instructions (Signed)
Patient instructed to take medications as defined in the Anti-coagulation Track section of this encounter.  Patient instructed to take today's dose.  Patient verbalized understanding of these instructions.    

## 2013-05-30 NOTE — Progress Notes (Signed)
Anti-Coagulation Progress Note  Breanna Wilkins is a 52 y.o. female who is currently on an anti-coagulation regimen.    RECENT RESULTS: Recent results are below, the most recent result is correlated with a dose of 30 mg. per week: Lab Results  Component Value Date   INR 2.00 05/30/2013   INR 3.40 05/09/2013   INR 2.70 04/18/2013    ANTI-COAG DOSE: Anticoagulation Dose Instructions as of 05/30/2013     [name] Tue Wed Thu Fri Sat   New Dose 5 mg 5 mg 5 mg 5 mg 5 mg 5 mg 5 mg       ANTICOAG SUMMARY: Anticoagulation Episode Summary   Current INR goal 2.0-3.0   Next INR check 06/20/2013   INR from last check 2.00 (05/30/2013)   Weekly max dose    Target end date    [facility]   Preferred lab    Send INR reminders to       Comments         ANTICOAG TODAY: Anticoagulation Summary as of 05/30/2013   INR goal 2.0-3.0   Selected INR 2.00 (05/30/2013)   Next INR check 06/20/2013   Target end date     Anticoagulation Episode Summary   [facility]   Preferred lab    Send INR reminders to    Comments       PATIENT INSTRUCTIONS: Patient Instructions  Patient instructed to take medications as defined in the Anti-coagulation Track section of this encounter.  Patient instructed to take today's dose.  Patient verbalized understanding of these instructions.       FOLLOW-UP Return in 3 weeks (on 06/20/2013) for Follow up INR at 2PM.  Hulen Luster, III Pharm.D., CACP

## 2013-05-31 NOTE — Progress Notes (Signed)
Indication: Venous thromboembolism.  Duration: Patient has decided lifelong.  INR: At target.  Agree with Dr. Saralyn Pilar assessment and plan.

## 2013-06-02 ENCOUNTER — Ambulatory Visit: Payer: Medicare Other | Admitting: Internal Medicine

## 2013-06-02 ENCOUNTER — Encounter: Payer: Self-pay | Admitting: Internal Medicine

## 2013-06-06 NOTE — Addendum Note (Signed)
Addended by: Neomia Dear on: 06/06/2013 05:50 PM   Modules accepted: Orders

## 2013-06-20 ENCOUNTER — Ambulatory Visit (INDEPENDENT_AMBULATORY_CARE_PROVIDER_SITE_OTHER): Payer: Medicare Other | Admitting: Pharmacist

## 2013-06-20 DIAGNOSIS — Z7901 Long term (current) use of anticoagulants: Secondary | ICD-10-CM

## 2013-06-20 DIAGNOSIS — I2699 Other pulmonary embolism without acute cor pulmonale: Secondary | ICD-10-CM

## 2013-06-20 MED ORDER — WARFARIN SODIUM 10 MG PO TABS: ORAL_TABLET | ORAL | Status: DC

## 2013-06-20 NOTE — Patient Instructions (Signed)
Patient instructed to take medications as defined in the Anti-coagulation Track section of this encounter.  Patient instructed to OMIT doses on Monday's. Take as directed on all other days.  Patient verbalized understanding of these instructions.

## 2013-06-20 NOTE — Progress Notes (Signed)
Anti-Coagulation Progress Note  Breanna Wilkins is a 52 y.o. female who is currently on an anti-coagulation regimen.    RECENT RESULTS: Recent results are below, the most recent result is correlated with a dose of 35 mg. per week: Lab Results  Component Value Date   INR 3.90 06/20/2013   INR 2.00 05/30/2013   INR 3.40 05/09/2013    ANTI-COAG DOSE: Anticoagulation Dose Instructions as of 06/20/2013     [name] Tue Wed Thu Fri Sat   New Dose 5 mg 0 mg 5 mg 5 mg 5 mg 5 mg 5 mg       ANTICOAG SUMMARY: Anticoagulation Episode Summary   Current INR goal 2.0-3.0   Next INR check 06/27/2013   INR from last check 3.90! (06/20/2013)   Weekly max dose    Target end date    [facility]   Preferred lab    Send INR reminders to       Comments         ANTICOAG TODAY: Anticoagulation Summary as of 06/20/2013   INR goal 2.0-3.0   Selected INR 3.90! (06/20/2013)   Next INR check 06/27/2013   Target end date     Anticoagulation Episode Summary   [facility] check [facility]   Preferred lab    Send INR reminders to    Comments       PATIENT INSTRUCTIONS: Patient Instructions  Patient instructed to take medications as defined in the Anti-coagulation Track section of this encounter.  Patient instructed to OMIT doses on Monday's. Take as directed on all other days.  Patient verbalized understanding of these instructions.       FOLLOW-UP Return in 7 days (on 06/27/2013) for Follow up INR at 2:15PM.  Hulen Luster, III Pharm.D., CACP

## 2013-06-27 ENCOUNTER — Ambulatory Visit (INDEPENDENT_AMBULATORY_CARE_PROVIDER_SITE_OTHER): Payer: Medicare Other | Admitting: Pharmacist

## 2013-06-27 DIAGNOSIS — Z86711 Personal history of pulmonary embolism: Secondary | ICD-10-CM

## 2013-06-27 DIAGNOSIS — Z7901 Long term (current) use of anticoagulants: Secondary | ICD-10-CM

## 2013-06-27 DIAGNOSIS — Z86718 Personal history of other venous thrombosis and embolism: Secondary | ICD-10-CM

## 2013-06-27 LAB — POCT INR: INR: 2

## 2013-06-27 NOTE — Patient Instructions (Signed)
Patient instructed to take medications as defined in the Anti-coagulation Track section of this encounter.  Patient instructed to take today's dose.  Patient verbalized understanding of these instructions.    

## 2013-06-27 NOTE — Progress Notes (Signed)
Anti-Coagulation Progress Note  Breanna Wilkins is a 52 y.o. female who is currently on an anti-coagulation regimen.    RECENT RESULTS: Recent results are below, the most recent result is correlated with a dose of 30 mg. per week: Lab Results  Component Value Date   INR 2.00 06/27/2013   INR 3.90 06/20/2013   INR 2.00 05/30/2013    ANTI-COAG DOSE: Anticoagulation Dose Instructions as of 06/27/2013     [name] Tue Wed Thu Fri Sat   New Dose 5 mg 5 mg 5 mg 5 mg 5 mg 5 mg 5 mg       ANTICOAG SUMMARY: Anticoagulation Episode Summary   Current INR goal 2.0-3.0   Next INR check 07/11/2013   INR from last check 2.00 (06/27/2013)   Weekly max dose    Target end date    [facility] location [facility]   Preferred lab    Send INR reminders to       Comments         ANTICOAG TODAY: Anticoagulation Summary as of 06/27/2013   INR goal 2.0-3.0   Selected INR 2.00 (06/27/2013)   Next INR check 07/11/2013   Target end date     Anticoagulation Episode Summary   [facility]   Preferred lab    Send INR reminders to    Comments       PATIENT INSTRUCTIONS: Patient Instructions  Patient instructed to take medications as defined in the Anti-coagulation Track section of this encounter.  Patient instructed to take today's dose.  Patient verbalized understanding of these instructions.       FOLLOW-UP Return in 2 weeks (on 07/11/2013) for Follow up INR at 2:30PM.  Hulen Luster, III Pharm.D., CACP

## 2013-07-04 ENCOUNTER — Encounter (HOSPITAL_COMMUNITY): Payer: Self-pay | Admitting: Certified Registered"

## 2013-07-11 ENCOUNTER — Ambulatory Visit (INDEPENDENT_AMBULATORY_CARE_PROVIDER_SITE_OTHER): Payer: Medicare Other | Admitting: Internal Medicine

## 2013-07-11 ENCOUNTER — Ambulatory Visit (INDEPENDENT_AMBULATORY_CARE_PROVIDER_SITE_OTHER): Payer: Medicare Other | Admitting: Pharmacist

## 2013-07-11 ENCOUNTER — Encounter: Payer: Self-pay | Admitting: Internal Medicine

## 2013-07-11 VITALS — BP 120/80 | HR 80 | Temp 97.4°F | Ht 67.0 in | Wt 305.5 lb

## 2013-07-11 DIAGNOSIS — IMO0002 Reserved for concepts with insufficient information to code with codable children: Secondary | ICD-10-CM

## 2013-07-11 DIAGNOSIS — Z7901 Long term (current) use of anticoagulants: Secondary | ICD-10-CM

## 2013-07-11 DIAGNOSIS — I10 Essential (primary) hypertension: Secondary | ICD-10-CM

## 2013-07-11 DIAGNOSIS — Z86718 Personal history of other venous thrombosis and embolism: Secondary | ICD-10-CM

## 2013-07-11 LAB — BASIC METABOLIC PANEL WITH GFR
BUN: 13 mg/dL (ref 6–23)
CO2: 27 mEq/L (ref 19–32)
Calcium: 9.3 mg/dL (ref 8.4–10.5)
GFR, Est African American: >89 mL/min
Glucose, Bld: 81 mg/dL (ref 70–99)
Potassium: 4 mEq/L (ref 3.5–5.3)
Sodium: 140 mEq/L (ref 135–145)

## 2013-07-11 LAB — POCT INR: INR: 2.2

## 2013-07-11 MED ORDER — PANTOPRAZOLE SODIUM 40 MG PO TBEC: DELAYED_RELEASE_TABLET | ORAL | Status: DC

## 2013-07-11 MED ORDER — HYDROCODONE-ACETAMINOPHEN 7.5-325 MG PO TABS: 1.0000 | ORAL_TABLET | Freq: Four times a day (QID) | ORAL | Status: DC | PRN

## 2013-07-11 NOTE — Patient Instructions (Signed)
1. Please call Dr. Shelle Iron office for follow up visit 2. Will give your a refill of Hydrocodone 7.5/325 mg po every 6 hour as needed  3. Follow up in 1-2 months.

## 2013-07-11 NOTE — Assessment & Plan Note (Addendum)
Assessment: She was evaluated by Dr. Shelle Iron at [facility] on 05/03/13. Ans she reports that she received an injection at her right shoulder. It was recommended to have right shoulder surgery but declined it. She received increased dosage of hydrocodone from the clinic in October 2014. She states that her right shoulder pain is much better and well controlled by hydrocodone. She is here for refill.   Plan: - Will obtain medical records from Dr. Shelle Iron office. - will give her refill of hydrocodone/APAP 7.5/325 1 tab every 8 h PRN # 45. Patient is asked to call Dr. Shelle Iron office to follow up with him. She should not receive refill unless she is followed by Dr. Shelle Iron.  - Pain contract signed - patient is encouraged to follow up with her Orthopedic Dr. Shelle Iron. - patient is instructed to call the clinic or go to ED if her pain is worse or she has right arm weakness, numbness or tingling.

## 2013-07-11 NOTE — Progress Notes (Signed)
Patient ID: Breanna Wilkins, female   DOB: 11-26-1960, 52 y.o.   MRN: [id number]    Patient: Breanna Wilkins   MRN: [id number]  DOB: 10-Aug-1960  PCP: Carlynn Purl, DO   Subjective:    CC: Shoulder Pain   HPI: Breanna Wilkins is a 52 y.o. female with a PMHx of PE on Coumadin, HTN, NSTEMI f/u by her cardiologist, OSA on CPAP and Right Supraspinatus tear followed by orthopedics, who presented to clinic today for the following:  1) Hypertension     She is on Lasix 80 mg daily, Coreg 6.25 mg twice a day.  And her lisinopril was added to her regimen in October 2014.   2) Supraspinatus tendon tear: see my assessment plan.   Review of Systems: Per HPI.   Current Outpatient Medications: Current Outpatient Prescriptions  Medication Sig Dispense Refill  . aspirin 81 MG chewable tablet Chew 1 tablet (81 mg total) by mouth daily.  30 tablet  1  . carvedilol (COREG) 6.25 MG tablet Take 1 tablet (6.25 mg total) by mouth 2 (two) times daily with a meal.  60 tablet  11  . Cyanocobalamin (VITAMIN B 12 PO) Take by mouth.      . furosemide (LASIX) 80 MG tablet Take 1 tablet (80 mg total) by mouth daily.  90 tablet  3  . HYDROcodone-acetaminophen (NORCO) 7.5-325 MG per tablet Take 1 tablet by mouth every 6 (six) hours as needed for pain.  45 tablet  0  . lisinopril (PRINIVIL,ZESTRIL) 10 MG tablet Take 1 tablet (10 mg total) by mouth daily.  90 tablet  3  . pantoprazole (PROTONIX) 40 MG tablet Take 1 tablet 30 minutes before meal (lunch/dinner)  30 tablet  3  . potassium chloride (K-DUR,KLOR-CON) 20 MEQ tablet Take 1 tablet (20 mEq total) by mouth daily.  30 tablet  6  . pravastatin (PRAVACHOL) 40 MG tablet Take 1 tablet (40 mg total) by mouth at bedtime.  30 tablet  11  . traMADol (ULTRAM) 50 MG tablet Take 1 tablet (50 mg total) by mouth every 8 (eight) hours as needed for pain.  90 tablet  2  . warfarin (COUMADIN) 10 MG tablet Take 1/2 tablet all days of week except on Mondays--omit dose on  Mondays.  30 tablet  2   No current facility-administered medications for this visit.    Allergies: No Known Allergies  Past Medical History  Diagnosis Date  . Shoulder pain   . Pulmonary embolus 01/16/10    on Coumadin indefinitely  . Abdominal hernia   . Hypertension   . Overweight(278.02)   . Lung nodule 12/2009    Very small, left upper lobe by CT June, 2011, one-year followup was stable  . Shortness of breath   . Warfarin anticoagulation   . Ejection fraction     65-70%, vigorous function, echo, March, 2011  . CHF (congestive heart failure)   . OSA (obstructive sleep apnea)     CPAP  . CAD (coronary artery disease)     Perioperative non-STEMI March, 2014  //   cardiac catheterization at time of non-STEMI, March, 2014, minor nonobstructive coronary disease, vigorous LV function, possibly vasospastic event versus stress cardiomyopathy. No further workup of coronary disease  . Ejection fraction     Vigorous function at time of catheterization October 06, 2012,    Objective:    Physical Exam: Filed Vitals:   07/11/13 1335  BP: 120/80  Pulse: 80  Temp: 97.4 F (  36.3 C)  TempSrc: Oral  Height: 5\' 7"  (1.702 m)  Weight: 305 lb 8 oz (138.574 kg)  SpO2: 99%     General: Vital signs reviewed and noted. Well-developed, well-nourished, in no acute distress; alert, appropriate and cooperative throughout examination.  Head: Normocephalic, atraumatic.  Lungs:  Normal respiratory effort. Clear to auscultation BL without crackles or wheezes.  Heart: RRR. S1 and S2 normal without gallop, rubs, murmur.  Abdomen:  BS normoactive. Soft, Nondistended, non-tender.  No masses or organomegaly.  Extremities: No pretibial edema.   Bilateral shoulders inspected.  Shoulders are as symmetrical.  No gross bony deformities or dislocation noted.  No erythema, swelling, warm to touch noted.  Limited ROM noted on right side.  Right shoulder : Tenderness to palpation at the  acromioclavicular  joint.  Positive impingement test. Equivocal Neer and Hawkins tests. Left shoulder no abnormality noted.     Assessment/ Plan:

## 2013-07-11 NOTE — Assessment & Plan Note (Addendum)
BP Readings from Last 3 Encounters:  07/11/13 120/80  05/12/13 133/87  04/29/13 134/64    Lab Results  Component Value Date   Jana Swartzlander 140 04/07/2013   K 3.3* 04/07/2013   CREATININE 0.82 04/07/2013    Assessment: Blood pressure control: controlled Progress toward BP goal:  at goal   Plan: Medications:  continue current medications Educational resources provided:   Self management tools provided:   Other plans:  She was asked to followup in 2-3 weeks after lisinopril was added to her regimen.  However she did not come for lab visit.  Will  check her BMP today.

## 2013-07-11 NOTE — Patient Instructions (Signed)
Patient instructed to take medications as defined in the Anti-coagulation Track section of this encounter.  Patient instructed to take today's dose.  Patient verbalized understanding of these instructions.    

## 2013-07-11 NOTE — Progress Notes (Signed)
Case discussed with Dr. Li soon after the resident saw the patient.  We reviewed the resident's history and exam and pertinent patient test results.  I agree with the assessment, diagnosis and plan of care documented in the resident's note. 

## 2013-07-11 NOTE — Progress Notes (Signed)
Anti-Coagulation Progress Note  Breanna Wilkins is a 52 y.o. female who is currently on an anti-coagulation regimen.    RECENT RESULTS: Recent results are below, the most recent result is correlated with a dose of 35 mg. per week: Lab Results  Component Value Date   INR 2.00 06/27/2013   INR 3.90 06/20/2013   INR 2.00 05/30/2013    ANTI-COAG DOSE: Anticoagulation Dose Instructions as of 07/11/2013     [name] Tue Wed Thu Fri Sat   New Dose 5 mg 5 mg 5 mg 5 mg 5 mg 5 mg 5 mg       ANTICOAG SUMMARY: Anticoagulation Episode Summary   Current INR goal 2.0-3.0   Next INR check 07/11/2013   INR from last check 2.00 (06/27/2013)   Weekly max dose    Target end date    [facility]   Preferred lab    Send INR reminders to       Comments         ANTICOAG TODAY: Anticoagulation Summary as of 07/11/2013   INR goal 2.0-3.0   Selected INR    Next INR check    Target end date     Anticoagulation Episode Summary   [facility]   Preferred lab    Send INR reminders to    Comments       PATIENT INSTRUCTIONS: Patient Instructions  Patient instructed to take medications as defined in the Anti-coagulation Track section of this encounter.  Patient instructed to take today's dose.  Patient verbalized understanding of these instructions.       FOLLOW-UP Return in about 5 weeks (around 08/15/2013) for Follow INR at 2:15PM.  Hulen Luster, III Pharm.D., CACP

## 2013-07-12 NOTE — Progress Notes (Signed)
Indication: Venous thromboembolism.  Duration: Patient has decided lifelong.  INR: At target.  Agree with Dr. Groce's assessment and plan. 

## 2013-08-04 ENCOUNTER — Encounter: Payer: Self-pay | Admitting: Hematology & Oncology

## 2013-08-04 NOTE — Progress Notes (Signed)
Chart opened in error

## 2013-08-15 ENCOUNTER — Ambulatory Visit (INDEPENDENT_AMBULATORY_CARE_PROVIDER_SITE_OTHER): Payer: Medicare Other | Admitting: Pharmacist

## 2013-08-15 ENCOUNTER — Other Ambulatory Visit: Payer: Self-pay | Admitting: *Deleted

## 2013-08-15 DIAGNOSIS — I82409 Acute embolism and thrombosis of unspecified deep veins of unspecified lower extremity: Secondary | ICD-10-CM

## 2013-08-15 DIAGNOSIS — Z86718 Personal history of other venous thrombosis and embolism: Secondary | ICD-10-CM

## 2013-08-15 DIAGNOSIS — Z7902 Long term (current) use of antithrombotics/antiplatelets: Secondary | ICD-10-CM

## 2013-08-15 LAB — POCT INR: INR: 3.4

## 2013-08-15 NOTE — Telephone Encounter (Signed)
Call when ready @ [phone]  Last received 12/22

## 2013-08-16 MED ORDER — HYDROCODONE-ACETAMINOPHEN 7.5-325 MG PO TABS: 1.0000 | ORAL_TABLET | Freq: Four times a day (QID) | ORAL | Status: DC | PRN

## 2013-08-16 NOTE — Telephone Encounter (Signed)
I talked with pt and she did NOT return to Dr Tonita Cong.  She states she has had 3 shoulder injections and they do not help.  She can not afford to keep having them for no reason.    Also she is asking for you to order the nasal device for her CPAP.  She had talked with you about this.

## 2013-08-16 NOTE — Telephone Encounter (Signed)
Will refill for 1 month. Would for patient to have appointment with me before next refill so I can reassess.

## 2013-08-16 NOTE — Patient Instructions (Signed)
Patient instructed to take medications as defined in the Anti-coagulation Track section of this encounter.  Patient instructed to take today's dose.  Patient verbalized understanding of these instructions.    

## 2013-08-16 NOTE — Progress Notes (Signed)
Anti-Coagulation Progress Note  Breanna Wilkins is a 53 y.o. female who is currently on an anti-coagulation regimen.    RECENT RESULTS: Recent results are below, the most recent result is correlated with a dose of 35 mg. per week: Lab Results  Component Value Date   INR 3.40 08/15/2013   INR 2.2 07/11/2013   INR 2.00 06/27/2013    ANTI-COAG DOSE: Anticoagulation Dose Instructions as of 08/15/2013     [name] Tue Wed Thu Fri Sat   New Dose 5 mg 5 mg 5 mg 5 mg 5 mg 5 mg 5 mg       ANTICOAG SUMMARY: Anticoagulation Episode Summary   Current INR goal 2.0-3.0   Next INR check 09/12/2013   INR from last check 3.40! (08/15/2013)   Weekly max dose    Target end date    [facility]   Preferred lab    Send INR reminders to       Comments         ANTICOAG TODAY: Anticoagulation Summary as of 08/15/2013   INR goal 2.0-3.0   Selected INR 3.40! (08/15/2013)   Next INR check 09/12/2013   Target end date     Anticoagulation Episode Summary   [facility]   Preferred lab    Send INR reminders to    Comments       PATIENT INSTRUCTIONS: Patient Instructions  Patient instructed to take medications as defined in the Anti-coagulation Track section of this encounter.  Patient instructed to take today's dose.  Patient verbalized understanding of these instructions.       FOLLOW-UP Return in 4 weeks (on 09/12/2013) for Follow up INR at 2:45PM.  [name], III Pharm.D., CACP

## 2013-08-16 NOTE — Telephone Encounter (Signed)
Can we find out if she did follow up with Dr. Tonita Cong (Ortho) as instructed by Dr. Nicoletta Dress in the pain medication contract (per her note in December)?  If so can we request records?

## 2013-08-18 ENCOUNTER — Other Ambulatory Visit: Payer: Self-pay | Admitting: *Deleted

## 2013-08-18 MED ORDER — PANTOPRAZOLE SODIUM 40 MG PO TBEC: DELAYED_RELEASE_TABLET | ORAL | Status: DC

## 2013-09-05 ENCOUNTER — Other Ambulatory Visit: Payer: Self-pay | Admitting: *Deleted

## 2013-09-05 MED ORDER — HYDROCODONE-ACETAMINOPHEN 7.5-325 MG PO TABS: 1.0000 | ORAL_TABLET | Freq: Four times a day (QID) | ORAL | Status: DC | PRN

## 2013-09-07 NOTE — Telephone Encounter (Signed)
Rx ready - pt called. 

## 2013-09-12 ENCOUNTER — Ambulatory Visit (INDEPENDENT_AMBULATORY_CARE_PROVIDER_SITE_OTHER): Payer: Medicare Other | Admitting: Pharmacist

## 2013-09-12 DIAGNOSIS — I2699 Other pulmonary embolism without acute cor pulmonale: Secondary | ICD-10-CM

## 2013-09-12 DIAGNOSIS — Z7901 Long term (current) use of anticoagulants: Secondary | ICD-10-CM

## 2013-09-12 LAB — POCT INR: INR: 2.8

## 2013-09-12 NOTE — Progress Notes (Signed)
Anti-Coagulation Progress Note  Breanna Wilkins is a 53 y.o. female who is currently on an anti-coagulation regimen.    RECENT RESULTS: Recent results are below, the most recent result is correlated with a dose of 50 mg. per week: Lab Results  Component Value Date   INR 2.80 09/12/2013   INR 3.40 08/15/2013   INR 2.2 07/11/2013    ANTI-COAG DOSE: Anticoagulation Dose Instructions as of 09/12/2013     [name] Tue Wed Thu Fri Sat   New Dose 5 mg 5 mg 5 mg 5 mg 5 mg 5 mg 5 mg       ANTICOAG SUMMARY: Anticoagulation Episode Summary   Current INR goal 2.0-3.0   Next INR check 10/10/2013   INR from last check 2.80 (09/12/2013)   Weekly max dose    Target end date    [facility]   Preferred lab    Send INR reminders to       Comments         ANTICOAG TODAY: Anticoagulation Summary as of 09/12/2013   INR goal 2.0-3.0   Selected INR 2.80 (09/12/2013)   Next INR check 10/10/2013   Target end date     Anticoagulation Episode Summary   [facility] check [facility]   Preferred lab    Send INR reminders to    Comments       PATIENT INSTRUCTIONS: Patient Instructions  Patient instructed to take medications as defined in the Anti-coagulation Track section of this encounter.  Patient instructed to take today's dose.  Patient verbalized understanding of these instructions.       FOLLOW-UP Return in 4 weeks (on 10/10/2013) for Follow up INR at 2:30PM.  [name], III Pharm.D., CACP

## 2013-09-12 NOTE — Patient Instructions (Signed)
Patient instructed to take medications as defined in the Anti-coagulation Track section of this encounter.  Patient instructed to take today's dose.  Patient verbalized understanding of these instructions.    

## 2013-09-18 DIAGNOSIS — I219 Acute myocardial infarction, unspecified: Secondary | ICD-10-CM

## 2013-09-18 HISTORY — DX: Acute myocardial infarction, unspecified: I21.9

## 2013-10-10 ENCOUNTER — Ambulatory Visit (INDEPENDENT_AMBULATORY_CARE_PROVIDER_SITE_OTHER): Payer: Medicare Other | Admitting: Pharmacist

## 2013-10-10 DIAGNOSIS — Z7902 Long term (current) use of antithrombotics/antiplatelets: Secondary | ICD-10-CM

## 2013-10-10 DIAGNOSIS — Z86718 Personal history of other venous thrombosis and embolism: Secondary | ICD-10-CM

## 2013-10-10 MED ORDER — WARFARIN SODIUM 10 MG PO TABS: ORAL_TABLET | ORAL | Status: DC

## 2013-10-10 NOTE — Addendum Note (Signed)
Addended by: [name] B on: 10/10/2013 04:38 PM   Modules accepted: Orders

## 2013-10-10 NOTE — Progress Notes (Signed)
Indication: Venous thromboembolism.  Duration: Patient prefers lifelong.  INR: At target.  Agree with Dr. Gladstone Pih assessment and plan.

## 2013-10-10 NOTE — Progress Notes (Signed)
Anti-Coagulation Progress Note  Breanna Wilkins is a 53 y.o. female who is currently on an anti-coagulation regimen.    RECENT RESULTS: Recent results are below, the most recent result is correlated with a dose of 35 mg. per week: Lab Results  Component Value Date   INR 2.80 09/12/2013   INR 3.40 08/15/2013   INR 2.2 07/11/2013    ANTI-COAG DOSE:    ANTICOAG SUMMARY: Anticoagulation Episode Summary   Current INR goal 2.0-3.0   Next INR check 10/10/2013   INR from last check 2.80 (09/12/2013)   Weekly max dose    Target end date    [facility]   Preferred lab    Send INR reminders to       Comments         ANTICOAG TODAY: Anticoagulation Summary as of 10/10/2013   INR goal 2.0-3.0   Selected INR    Next INR check    Target end date     Anticoagulation Episode Summary   [facility] check location [facility]   Preferred lab    Send INR reminders to    Comments       PATIENT INSTRUCTIONS: There are no Patient Instructions on file for this visit.   FOLLOW-UP Return in 4 weeks (on 11/07/2013) for Follow up INR at 2:15pm.  [name], III Pharm.D., CACP

## 2013-10-14 ENCOUNTER — Encounter: Payer: Self-pay | Admitting: Internal Medicine

## 2013-10-14 ENCOUNTER — Ambulatory Visit (INDEPENDENT_AMBULATORY_CARE_PROVIDER_SITE_OTHER): Payer: Medicare Other | Admitting: Internal Medicine

## 2013-10-14 VITALS — BP 110/69 | HR 93 | Temp 97.4°F | Ht 67.0 in | Wt 307.5 lb

## 2013-10-14 DIAGNOSIS — I5022 Chronic systolic (congestive) heart failure: Secondary | ICD-10-CM

## 2013-10-14 DIAGNOSIS — E785 Hyperlipidemia, unspecified: Secondary | ICD-10-CM

## 2013-10-14 DIAGNOSIS — I509 Heart failure, unspecified: Secondary | ICD-10-CM

## 2013-10-14 DIAGNOSIS — I10 Essential (primary) hypertension: Secondary | ICD-10-CM

## 2013-10-14 LAB — BASIC METABOLIC PANEL WITH GFR
BUN: 18 mg/dL (ref 6–23)
CALCIUM: 9.5 mg/dL (ref 8.4–10.5)
CO2: 28 meq/L (ref 19–32)
Chloride: 99 mEq/L (ref 96–112)
Creat: 0.86 mg/dL (ref 0.50–1.10)
GFR, EST AFRICAN AMERICAN: 89 mL/min
GFR, Est Non African American: 77 mL/min
Glucose, Bld: 100 mg/dL — ABNORMAL HIGH (ref 70–99)
Potassium: 4.3 mEq/L (ref 3.5–5.3)
SODIUM: 141 meq/L (ref 135–145)

## 2013-10-14 MED ORDER — POTASSIUM CHLORIDE CRYS ER 20 MEQ PO TBCR: 20.0000 meq | EXTENDED_RELEASE_TABLET | Freq: Every day | ORAL | Status: DC

## 2013-10-14 NOTE — Patient Instructions (Signed)
Please go up to the lab and have your blood drawn. Please take KDur (potassium) 47mEq daily. Take Lasix 80mg  twice a day. Return on Monday for blood draw. You will hear from me by Tuesday.  Please bring your medicines with you each time you come.   Medicines may be  Eye drops  Herbal   Vitamins  Pills  Seeing these help [place] take care of you.

## 2013-10-14 NOTE — Progress Notes (Signed)
[facility] Subjective:   Patient ID: KHRISTIN KELEHER female   DOB: [birth year] 53 y.o.   MRN: [id number]  HPI: Ms.Verl D Cassetta is a 53 y.o. female with a PMH significant for  CC: Spasms in hands and toes  Reports spasms started a few days ago, it happens in both hands and both feet.  She reports this has happened two or three times.  She reports when it happens her fingers separate.  She reports she has increased her exercise lately and is trying to loose weight.  She reports that she had not lost any weight and felt she was "swelling up" so she increased her lasix from 40mg  to 80mg .  She is SOB occasionally but reports this is with exercise. She is using her CPAP at night.  Past Medical History  Diagnosis Date  . Shoulder pain   . Pulmonary embolus 01/16/10    on Coumadin indefinitely  . Abdominal hernia   . Hypertension   . Overweight   . Lung nodule 12/2009    Very small, left upper lobe by CT June, 2011, one-year followup was stable  . Shortness of breath   . Warfarin anticoagulation   . Ejection fraction     65-70%, vigorous function, echo, March, 2011  . CHF (congestive heart failure)   . OSA (obstructive sleep apnea)     CPAP  . CAD (coronary artery disease)     Perioperative non-STEMI March, 2014  //   cardiac catheterization at time of non-STEMI, March, 2014, minor nonobstructive coronary disease, vigorous LV function, possibly vasospastic event versus stress cardiomyopathy. No further workup of coronary disease  . Ejection fraction     Vigorous function at time of catheterization October 06, 2012,   Current Outpatient Prescriptions  Medication Sig Dispense Refill  . aspirin 81 MG chewable tablet Chew 1 tablet (81 mg total) by mouth daily.  30 tablet  1  . carvedilol (COREG) 6.25 MG tablet Take 1 tablet (6.25 mg total) by mouth 2 (two) times daily with a meal.  60 tablet  11  . Cyanocobalamin (VITAMIN B 12 PO) Take by mouth.      . furosemide  (LASIX) 80 MG tablet Take 1 tablet (80 mg total) by mouth daily.  90 tablet  3  . HYDROcodone-acetaminophen (NORCO) 7.5-325 MG per tablet Take 1 tablet by mouth every 6 (six) hours as needed.  45 tablet  0  . lisinopril (PRINIVIL,ZESTRIL) 10 MG tablet Take 1 tablet (10 mg total) by mouth daily.  90 tablet  3  . pantoprazole (PROTONIX) 40 MG tablet Take 1 tablet 30 minutes before meal (lunch/dinner)  90 tablet  1  . pravastatin (PRAVACHOL) 40 MG tablet Take 1 tablet (40 mg total) by mouth at bedtime.  30 tablet  11  . traMADol (ULTRAM) 50 MG tablet Take 1 tablet (50 mg total) by mouth every 8 (eight) hours as needed for pain.  90 tablet  2  . warfarin (COUMADIN) 10 MG tablet Take 1/2 tablet daily.  30 tablet  2   No current facility-administered medications for this visit.   Family History  Problem Relation Age of Onset  . Diabetes Father   . Diabetes Sister   . Diabetes Mother   . Deep vein thrombosis Sister   . Ovarian cancer Mother   . Bone cancer Maternal Grandmother    History   Social History  . Marital Status: Single    Spouse Name: N/A  Number of Children: N/A  . Years of Education: N/A   Social History Main Topics  . Smoking status: Former Research scientist (life sciences)  . Smokeless tobacco: None     Comment: qoit 12 years ago  . Alcohol Use: 3.6 oz/week    0 Drinks containing 0.5 oz of alcohol, 6 Cans of beer per week     Comment: 6 pack of beer  . Drug Use: No     Comment: smoked joint 2 weeks ago  . Sexual Activity: None   Other Topics Concern  . None   Social History Narrative   Financial assistance approved for 100% discount at [facility] and has [facility] card per [employer]   12/25/2009   Review of Systems: Review of Systems  Constitutional: Negative for fever, chills and weight loss (weight gain).  Eyes: Negative for blurred vision.  Respiratory: Positive for shortness of breath (with exercise).   Cardiovascular: Negative for chest pain.  Gastrointestinal: Negative for abdominal  pain.  Musculoskeletal: Negative for myalgias.  Neurological: Negative for dizziness.     Objective:  Physical Exam: Filed Vitals:   10/14/13 1605  BP: 110/69  Pulse: 93  Temp: 97.4 F (36.3 C)  TempSrc: Oral  Height: 5\' 7"  (1.702 m)  Weight: 307 lb 8 oz (139.481 kg)  SpO2: 97%  Physical Exam  Nursing note and vitals reviewed. Constitutional: She is well-developed, well-nourished, and in no distress.  HENT:  Head: Normocephalic and atraumatic.  Cardiovascular: Normal rate and regular rhythm.   Pulmonary/Chest: Effort normal and breath sounds normal. No respiratory distress.  Difficult to appreciate any rales given patient's body habitus  Abdominal: Soft. Bowel sounds are normal.  Musculoskeletal: Normal range of motion. She exhibits edema (1+ LE edema to shins bilaterally.).    Wt Readings from Last 5 Encounters:  10/14/13 307 lb 8 oz (139.481 kg)  07/11/13 305 lb 8 oz (138.574 kg)  05/12/13 298 lb 1.6 oz (135.217 kg)  04/29/13 301 lb 12.8 oz (136.896 kg)  04/07/13 295 lb 11.2 oz (134.129 kg)    Assessment & Plan:  Case discussed with Dr. Lynnae January See Problem Based Assessment and Plan Medications Ordered Meds ordered this encounter  Medications  . potassium chloride SA (K-DUR,KLOR-CON) 20 MEQ tablet    Sig: Take 1 tablet (20 mEq total) by mouth daily.    Dispense:  30 tablet    Refill:  0   Other Orders Orders Placed This Encounter  Procedures  . BMP with Estimated GFR ([id number])  . BMP with Estimated GFR ([id number])    Standing Status: Future     Number of Occurrences:      Standing Expiration Date: 10/20/2014

## 2013-10-14 NOTE — Assessment & Plan Note (Addendum)
Weight increased 307 today up from 295-300.  Patient's Spasms may be secondary to hypokalmia given her increase lasix dose she has been hypokalemic in the past. -BMP today - Lasix 80mg  BID - KDur 81mEq daily - Repeat BMP on Monday  BMP results today show K+ of 4.3 will continue supplementation with increased lasix dose and await repeat BMP on Monday.

## 2013-10-15 NOTE — Assessment & Plan Note (Signed)
BP Readings from Last 3 Encounters:  10/14/13 110/69  07/11/13 120/80  05/12/13 133/87    Lab Results  Component Value Date   NA 141 10/14/2013   K 4.3 10/14/2013   CREATININE 0.86 10/14/2013    Assessment: Blood pressure control: controlled Progress toward BP goal:  at goal Comments:   Plan: Medications:  Coreg 6.25mg  BID, Lisinopril 10 mg daily, Lasix 80mg  BID through Monday then likely return to daily. Educational resources provided:   Self management tools provided:   Other plans: Will need to increase lisinopril to 20mg  eventually will hold off on doing this given low normal BP and likely volume overload (increaseing lasix dose).

## 2013-10-15 NOTE — Assessment & Plan Note (Signed)
-  Repeat Lipid profile.

## 2013-10-17 ENCOUNTER — Other Ambulatory Visit (INDEPENDENT_AMBULATORY_CARE_PROVIDER_SITE_OTHER): Payer: Medicare Other

## 2013-10-17 ENCOUNTER — Other Ambulatory Visit: Payer: Self-pay | Admitting: Internal Medicine

## 2013-10-17 DIAGNOSIS — E785 Hyperlipidemia, unspecified: Secondary | ICD-10-CM

## 2013-10-17 DIAGNOSIS — Z9989 Dependence on other enabling machines and devices: Principal | ICD-10-CM

## 2013-10-17 DIAGNOSIS — G4733 Obstructive sleep apnea (adult) (pediatric): Secondary | ICD-10-CM

## 2013-10-17 DIAGNOSIS — I10 Essential (primary) hypertension: Secondary | ICD-10-CM

## 2013-10-17 DIAGNOSIS — I5022 Chronic systolic (congestive) heart failure: Secondary | ICD-10-CM

## 2013-10-17 LAB — BASIC METABOLIC PANEL WITH GFR
BUN: 15 mg/dL (ref 6–23)
CO2: 29 mEq/L (ref 19–32)
Calcium: 9.4 mg/dL (ref 8.4–10.5)
Chloride: 100 mEq/L (ref 96–112)
Creat: 0.9 mg/dL (ref 0.50–1.10)
GFR, Est African American: 84 mL/min
GFR, Est Non African American: 73 mL/min
Glucose, Bld: 86 mg/dL (ref 70–99)
Potassium: 4.3 mEq/L (ref 3.5–5.3)
Sodium: 138 mEq/L (ref 135–145)

## 2013-10-17 LAB — LIPID PANEL
Cholesterol: 164 mg/dL (ref 0–200)
HDL: 42 mg/dL (ref >39)
LDL Cholesterol: 102 mg/dL — ABNORMAL HIGH (ref 0–99)
Total CHOL/HDL Ratio: 3.9 Ratio
Triglycerides: 98 mg/dL (ref <150)
VLDL: 20 mg/dL (ref 0–40)

## 2013-10-17 NOTE — Progress Notes (Signed)
Case discussed with Dr. Hoffman at the time of the visit.  We reviewed the resident's history and exam and pertinent patient test results.  I agree with the assessment, diagnosis, and plan of care documented in the resident's note. 

## 2013-10-19 ENCOUNTER — Other Ambulatory Visit: Payer: Self-pay | Admitting: Internal Medicine

## 2013-10-19 ENCOUNTER — Telehealth: Payer: Self-pay | Admitting: Internal Medicine

## 2013-10-19 DIAGNOSIS — I5022 Chronic systolic (congestive) heart failure: Secondary | ICD-10-CM

## 2013-10-19 DIAGNOSIS — G4733 Obstructive sleep apnea (adult) (pediatric): Secondary | ICD-10-CM

## 2013-10-19 NOTE — Telephone Encounter (Signed)
Called patient and informed her her blood work looked fine. Her weight on Monday was 310, I encouraged her to continue Lasix 80mg  BID, and asked the front desk to schedule an appointment for early next week to reevaluate her. I have also placed orders for a new CPAP nasal mask.

## 2013-10-27 ENCOUNTER — Encounter: Payer: Self-pay | Admitting: Internal Medicine

## 2013-11-03 ENCOUNTER — Ambulatory Visit (INDEPENDENT_AMBULATORY_CARE_PROVIDER_SITE_OTHER): Payer: Medicare Other | Admitting: Internal Medicine

## 2013-11-03 ENCOUNTER — Encounter: Payer: Self-pay | Admitting: Internal Medicine

## 2013-11-03 ENCOUNTER — Ambulatory Visit (HOSPITAL_COMMUNITY)
Admission: RE | Admit: 2013-11-03 | Discharge: 2013-11-03 | Disposition: A | Discharge status: Home / Self Care | Payer: Medicare Other | Source: Ambulatory Visit | Attending: Internal Medicine | Admitting: Internal Medicine

## 2013-11-03 VITALS — BP 115/69 | HR 88 | Temp 96.6°F | Ht 67.0 in | Wt 307.1 lb

## 2013-11-03 DIAGNOSIS — M79662 Pain in left lower leg: Secondary | ICD-10-CM

## 2013-11-03 DIAGNOSIS — I10 Essential (primary) hypertension: Secondary | ICD-10-CM

## 2013-11-03 DIAGNOSIS — E785 Hyperlipidemia, unspecified: Secondary | ICD-10-CM

## 2013-11-03 DIAGNOSIS — Z7901 Long term (current) use of anticoagulants: Secondary | ICD-10-CM

## 2013-11-03 DIAGNOSIS — I509 Heart failure, unspecified: Secondary | ICD-10-CM

## 2013-11-03 DIAGNOSIS — M79609 Pain in unspecified limb: Secondary | ICD-10-CM

## 2013-11-03 DIAGNOSIS — M79672 Pain in left foot: Secondary | ICD-10-CM

## 2013-11-03 LAB — POCT INR: INR: 2.6

## 2013-11-03 MED ORDER — GABAPENTIN 100 MG PO CAPS: 100.0000 mg | ORAL_CAPSULE | Freq: Three times a day (TID) | ORAL | Status: DC

## 2013-11-03 MED ORDER — PRAVASTATIN SODIUM 40 MG PO TABS: 40.0000 mg | ORAL_TABLET | Freq: Every day | ORAL | Status: DC

## 2013-11-03 MED ORDER — CARVEDILOL 6.25 MG PO TABS: 6.2500 mg | ORAL_TABLET | Freq: Two times a day (BID) | ORAL | Status: DC

## 2013-11-03 MED ORDER — HYDROCODONE-ACETAMINOPHEN 7.5-325 MG PO TABS: 1.0000 | ORAL_TABLET | Freq: Four times a day (QID) | ORAL | Status: DC | PRN

## 2013-11-03 NOTE — Assessment & Plan Note (Signed)
Patient tolerating pravastatin 40mg  LDL 102, roughly at goal. Will continue pravastatin at this time. Can consider increasing dose versus changing statin in future.

## 2013-11-03 NOTE — Assessment & Plan Note (Signed)
Given patient's history of DVT and PE her left calf pain and mild swelling is concerning for a pulmonary embolism. - Obtain POC INR- returned 2.6 patient is theraputic.  However she has also had a history of DVT despite a therapeutic INR.   -Obtain Venous duplex of LLE >>> negative for DVT.

## 2013-11-03 NOTE — Assessment & Plan Note (Signed)
BP Readings from Last 3 Encounters:  11/03/13 115/69  10/14/13 110/69  07/11/13 120/80    Lab Results  Component Value Date   NA 138 10/17/2013   K 4.3 10/17/2013   CREATININE 0.90 10/17/2013    Assessment: Blood pressure control:  at goal Progress toward BP goal:    Comments:   Plan: Medications:  continue current medications Educational resources provided:   Self management tools provided:   Other plans: none

## 2013-11-03 NOTE — Progress Notes (Signed)
[facility] Subjective:   Patient ID: Breanna Wilkins female   DOB: 09-Nov-1960 53 y.o.   MRN: [id number]  HPI: Breanna Wilkins is a 53 y.o. female with a PMH significant for CHF, OSA, PE, CAD, HTN  CC: Left foot pain  At her last visit she was complaining of weight gain and tingling in her hands after she increased her lasix.  BMP at that time showed normal potassium. She was encouraged to take lasix BID for 1 week and follow up.  She choose to delay follow up for 2 weeks.  Today she reports that she has been taking Lasix 80mg  in the morning and 40mg  most evenings.  She does note that she has been having left foot pain especially when she is walking on the treadmill and also worse at night.  She describes her pain as stinging, "needles," "like an electric shock."  She reports the norco she has been taking for her should pain hasn't helped much for this pain.  She feels that her left leg is also a little more swollen.  She does report shortness of breath from walking about 100 to 200 feet but this is not much different from normal. She denies chest pain, hemoptysis.  Past Medical History  Diagnosis Date  . Shoulder pain   . Pulmonary embolus 01/16/10    on Coumadin indefinitely  . Abdominal hernia   . Hypertension   . Overweight   . Lung nodule 12/2009    Very small, left upper lobe by CT June, 2011, one-year followup was stable  . Shortness of breath   . Warfarin anticoagulation   . Ejection fraction     65-70%, vigorous function, echo, March, 2011  . CHF (congestive heart failure)   . OSA (obstructive sleep apnea)     CPAP  . CAD (coronary artery disease)     Perioperative non-STEMI March, 2014  //   cardiac catheterization at time of non-STEMI, March, 2014, minor nonobstructive coronary disease, vigorous LV function, possibly vasospastic event versus stress cardiomyopathy. No further workup of coronary disease  . Ejection fraction     Vigorous function at  time of catheterization October 06, 2012,   Current Outpatient Prescriptions  Medication Sig Dispense Refill  . aspirin 81 MG chewable tablet Chew 1 tablet (81 mg total) by mouth daily.  30 tablet  1  . carvedilol (COREG) 6.25 MG tablet Take 1 tablet (6.25 mg total) by mouth 2 (two) times daily with a meal.  60 tablet  11  . furosemide (LASIX) 80 MG tablet Take 1 tablet (80 mg total) by mouth daily.  90 tablet  3  . HYDROcodone-acetaminophen (NORCO) 7.5-325 MG per tablet Take 1 tablet by mouth every 6 (six) hours as needed.  45 tablet  0  . lisinopril (PRINIVIL,ZESTRIL) 10 MG tablet Take 1 tablet (10 mg total) by mouth daily.  90 tablet  3  . pantoprazole (PROTONIX) 40 MG tablet Take 1 tablet 30 minutes before meal (lunch/dinner)  90 tablet  1  . potassium chloride SA (K-DUR,KLOR-CON) 20 MEQ tablet Take 1 tablet (20 mEq total) by mouth daily.  30 tablet  0  . pravastatin (PRAVACHOL) 40 MG tablet Take 1 tablet (40 mg total) by mouth at bedtime.  30 tablet  11  . warfarin (COUMADIN) 10 MG tablet Take 1/2 tablet daily.  30 tablet  2  . Cyanocobalamin (VITAMIN B 12 PO) Take by mouth.      . gabapentin (  NEURONTIN) 100 MG capsule Take 1 capsule (100 mg total) by mouth 3 (three) times daily.  90 capsule  1   No current facility-administered medications for this visit.   Family History  Problem Relation Age of Onset  . Diabetes Father   . Diabetes Sister   . Diabetes Mother   . Deep vein thrombosis Sister   . Ovarian cancer Mother   . Bone cancer Maternal Grandmother    History   Social History  . Marital Status: Single    Spouse Name: N/A    Number of Children: N/A  . Years of Education: N/A   Social History Main Topics  . Smoking status: Former Research scientist (life sciences)  . Smokeless tobacco: None     Comment: qoit 12 years ago  . Alcohol Use: 3.6 oz/week    0 Drinks containing 0.5 oz of alcohol, 6 Cans of beer per week     Comment: 6 pack of beer  . Drug Use: No     Comment: smoked joint 2 weeks ago   . Sexual Activity: None   Other Topics Concern  . None   Social History Narrative   Financial assistance approved for 100% discount at [facility] and has [facility] card per [employer]   12/25/2009   Review of Systems: Review of Systems  Constitutional: Negative for fever, chills and weight loss.  Eyes: Negative for blurred vision.  Respiratory: Positive for shortness of breath (with exercise).   Cardiovascular: Positive for leg swelling. Negative for chest pain, palpitations and claudication.  Gastrointestinal: Negative for abdominal pain.  Genitourinary: Negative for dysuria.  Musculoskeletal: Negative for myalgias.  Skin: Negative for rash.  Neurological: Negative for dizziness and headaches.  Endo/Heme/Allergies: Negative for polydipsia.  Psychiatric/Behavioral: Negative for depression and substance abuse.     Objective:  Physical Exam: Filed Vitals:   11/03/13 1424  BP: 115/69  Pulse: 88  Temp: 96.6 F (35.9 C)  TempSrc: Oral  Height: 5\' 7"  (1.702 m)  Weight: 307 lb 1.6 oz (139.3 kg)  SpO2: 98%  Physical Exam  Nursing note and vitals reviewed. Constitutional: She is well-developed, well-nourished, and in no distress.  HENT:  Head: Normocephalic and atraumatic.  Eyes: Conjunctivae are normal. Pupils are equal, round, and reactive to light.  Neck: Neck supple.  Cardiovascular: Normal rate and regular rhythm.   Pulmonary/Chest: Effort normal and breath sounds normal. No respiratory distress. She has no wheezes. She has no rales.  Abdominal: Soft. Bowel sounds are normal.  Musculoskeletal: Normal range of motion. She exhibits edema (trace bilaterally).  Left lower extremity slightly more swollen than right.  Patient does have calf tenderness on the left side.  She has paraspinal tenderness in the lumbar region bilaterally.  Her straight leg raise on the right is neg to 80 degrees, on the left straight leg raise to  ~45 degrees which produces some tightness in hamstrings and calf,  no numbness or tingling.  Skin: Skin is warm and dry.  Psychiatric: Affect normal.    Wt Readings from Last 5 Encounters:  11/03/13 307 lb 1.6 oz (139.3 kg)  10/14/13 307 lb 8 oz (139.481 kg)  07/11/13 305 lb 8 oz (138.574 kg)  05/12/13 298 lb 1.6 oz (135.217 kg)  04/29/13 301 lb 12.8 oz (136.896 kg)    Assessment & Plan:  Case discussed with Dr. Dareen Piano See Problem Based Assessment and Plan Medications Ordered Meds ordered this encounter  Medications  . gabapentin (NEURONTIN) 100 MG capsule    Sig: Take  1 capsule (100 mg total) by mouth 3 (three) times daily.    Dispense:  90 capsule    Refill:  1  . carvedilol (COREG) 6.25 MG tablet    Sig: Take 1 tablet (6.25 mg total) by mouth 2 (two) times daily with a meal.    Dispense:  60 tablet    Refill:  11  . DISCONTD: HYDROcodone-acetaminophen (NORCO) 7.5-325 MG per tablet    Sig: Take 1 tablet by mouth every 6 (six) hours as needed.    Dispense:  45 tablet    Refill:  0    Rx 1/3 To be filled on or after 11/03/13  . pravastatin (PRAVACHOL) 40 MG tablet    Sig: Take 1 tablet (40 mg total) by mouth at bedtime.    Dispense:  30 tablet    Refill:  11  . DISCONTD: HYDROcodone-acetaminophen (NORCO) 7.5-325 MG per tablet    Sig: Take 1 tablet by mouth every 6 (six) hours as needed.    Dispense:  45 tablet    Refill:  0    Rx 2/3 To be filled 30 days after 11/03/13  . HYDROcodone-acetaminophen (NORCO) 7.5-325 MG per tablet    Sig: Take 1 tablet by mouth every 6 (six) hours as needed.    Dispense:  45 tablet    Refill:  0    Rx 3/3 To be filled 60 days after 11/03/13   Other Orders Orders Placed This Encounter  Procedures  . POCT INR  . Lower Extremity Venous Duplex Left    Standing Status: Future     Number of Occurrences: 1     Standing Expiration Date: 11/04/2014    Order Specific Question:  Laterality    Answer:  Left    Order Specific Question:  Where should this test be performed:    Answer:  [facility]

## 2013-11-03 NOTE — Patient Instructions (Signed)
Please try taking Gabapentin 100mg  three times a day.  If no better after a week give me a call or message me thru MyChart and we can see about getting you an Xray.  If a little better but not fully then also call and we may increase your dose.  Thank you for bringing your medicines today. This helps [place] keep you safe from mistakes.

## 2013-11-03 NOTE — Progress Notes (Signed)
VASCULAR LAB PRELIMINARY  PRELIMINARY  PRELIMINARY  PRELIMINARY  Left lower extremity venous Doppler completed.    Preliminary report:  There is no DVT or SVT noted in the left lower extremity.  Iantha Fallen, RVT 11/03/2013, 4:05 PM

## 2013-11-03 NOTE — Assessment & Plan Note (Signed)
Description of pain consistent with neuropathic pain, however given absence of low back pain, a negative straight leg test, and no history of diabetes I am unsure of this as a cause.  I obtained an INR to make sure patient is therapeutic and a lower extremity doppler to rule out a DVT.  Other differential includes a stress fracture of her foot given her obesity and exercise. I discussed these differentials with the patient and we agreed to start a trial of Gabapentin 100mg  TID and will assess for improvement in possible neuropathic pain.  If no improvement we will proceed with a Xray of foot to evaluate for stress fracture, in some improvement can consider titration of dose upward. - Patient to follow up in 1-3 months.

## 2013-11-03 NOTE — Assessment & Plan Note (Signed)
Check INR 

## 2013-11-03 NOTE — Assessment & Plan Note (Addendum)
Patient has had variable use of Lasix.  She currently does not appear grossly volume overloaded (mild lower extremity edema).  Will have patient continue Lasix at 80 mg daily.  Reevaluate in 1- 3 months.  Given heart failure instructions to call me for weight gain.  10/14/2013 Office Visit Edited 10/15/2013 3:26 PM by Joni Reining, DO   Weight increased 307 today up from 295-300. Patient's Spasms may be secondary to hypokalmia given her increase lasix dose she has been hypokalemic in the past.  -BMP today  - Lasix 80mg  BID  - KDur 69mEq daily  - Repeat BMP on Monday  BMP results today show K+ of 4.3 will continue supplementation with increased lasix dose and await repeat BMP on Monday.

## 2013-11-04 NOTE — Progress Notes (Signed)
INTERNAL MEDICINE TEACHING ATTENDING ADDENDUM - Taiyo Kozma, MD: I reviewed and discussed at the time of visit with the resident Dr. Hoffman, the patient’s medical history, physical examination, diagnosis and results of tests and treatment and I agree with the patient's care as documented. ° °

## 2013-11-07 ENCOUNTER — Ambulatory Visit (INDEPENDENT_AMBULATORY_CARE_PROVIDER_SITE_OTHER): Payer: Medicare Other | Admitting: Pharmacist

## 2013-11-07 DIAGNOSIS — Z7902 Long term (current) use of antithrombotics/antiplatelets: Secondary | ICD-10-CM

## 2013-11-07 LAB — POCT INR: INR: 3

## 2013-11-07 NOTE — Progress Notes (Signed)
Anti-Coagulation Progress Note  Breanna Wilkins is a 53 y.o. female who is currently on an anti-coagulation regimen.    RECENT RESULTS: Recent results are below, the most recent result is correlated with a dose of 35 mg. per week: Lab Results  Component Value Date   INR 3.00 11/07/2013   INR 2.6 11/03/2013   INR 2.80 09/12/2013    ANTI-COAG DOSE: Anticoagulation Dose Instructions as of 11/07/2013     [name] Tue Wed Thu Fri Sat   New Dose 5 mg 5 mg 5 mg 5 mg 5 mg 5 mg 5 mg       ANTICOAG SUMMARY: Anticoagulation Episode Summary   Current INR goal 2.0-3.0   Next INR check 12/05/2013   INR from last check 3.00 (11/07/2013)   Weekly max dose    Target end date    [facility]   Preferred lab    Send INR reminders to       Comments         ANTICOAG TODAY: Anticoagulation Summary as of 11/07/2013   INR goal 2.0-3.0   Selected INR 3.00 (11/07/2013)   Next INR check 12/05/2013   Target end date     Anticoagulation Episode Summary   [facility]   Preferred lab    Send INR reminders to    Comments       PATIENT INSTRUCTIONS: Patient Instructions  Patient instructed to take medications as defined in the Anti-coagulation Track section of this encounter.  Patient instructed to take today's dose.  Patient verbalized understanding of these instructions.       FOLLOW-UP Return in 4 weeks (on 12/05/2013) for Follow up INR at 2:45PM.  [name], III Pharm.D., CACP

## 2013-11-07 NOTE — Patient Instructions (Signed)
Patient instructed to take medications as defined in the Anti-coagulation Track section of this encounter.  Patient instructed to take today's dose.  Patient verbalized understanding of these instructions.    

## 2013-11-08 NOTE — Progress Notes (Signed)
Indication: Venous thromboembolism.  Duration: Lifelong per patient preference.  INR: At target.  Agree with Dr. Gladstone Pih assessment and plan.

## 2013-11-25 ENCOUNTER — Telehealth: Payer: Self-pay | Admitting: *Deleted

## 2013-11-25 MED ORDER — GABAPENTIN 100 MG PO CAPS: 200.0000 mg | ORAL_CAPSULE | Freq: Three times a day (TID) | ORAL | Status: DC

## 2013-11-25 NOTE — Telephone Encounter (Signed)
I did tell her she could increase up to 300mg  TID but if she is doing well at 200mg  TID I would stay with that. Refilled Thank You, Cindee Salt

## 2013-11-25 NOTE — Telephone Encounter (Signed)
Pt called states was told to increase  gabapentin 100mg  to 200mg  three times a day. Doing better. Needs new Rx for 200mg  three times per day to [facility].Hilda Blades Kymberly Blomberg RN 11/25/13 1:30PM

## 2013-12-01 ENCOUNTER — Other Ambulatory Visit: Payer: Self-pay | Admitting: Internal Medicine

## 2013-12-01 DIAGNOSIS — Z1231 Encounter for screening mammogram for malignant neoplasm of breast: Secondary | ICD-10-CM

## 2013-12-05 ENCOUNTER — Ambulatory Visit (INDEPENDENT_AMBULATORY_CARE_PROVIDER_SITE_OTHER): Payer: Medicare Other | Admitting: Internal Medicine

## 2013-12-05 ENCOUNTER — Ambulatory Visit (INDEPENDENT_AMBULATORY_CARE_PROVIDER_SITE_OTHER): Payer: Medicare Other | Admitting: Pharmacist

## 2013-12-05 ENCOUNTER — Encounter: Payer: Self-pay | Admitting: Internal Medicine

## 2013-12-05 VITALS — BP 124/78 | HR 77 | Temp 97.3°F | Ht 67.0 in | Wt 304.7 lb

## 2013-12-05 DIAGNOSIS — G5600 Carpal tunnel syndrome, unspecified upper limb: Secondary | ICD-10-CM | POA: Insufficient documentation

## 2013-12-05 DIAGNOSIS — M79609 Pain in unspecified limb: Secondary | ICD-10-CM

## 2013-12-05 DIAGNOSIS — I509 Heart failure, unspecified: Secondary | ICD-10-CM

## 2013-12-05 DIAGNOSIS — Z86718 Personal history of other venous thrombosis and embolism: Secondary | ICD-10-CM

## 2013-12-05 DIAGNOSIS — M79662 Pain in left lower leg: Secondary | ICD-10-CM

## 2013-12-05 DIAGNOSIS — Z7902 Long term (current) use of antithrombotics/antiplatelets: Secondary | ICD-10-CM

## 2013-12-05 DIAGNOSIS — G5601 Carpal tunnel syndrome, right upper limb: Secondary | ICD-10-CM | POA: Insufficient documentation

## 2013-12-05 LAB — POCT INR: INR: 3

## 2013-12-05 MED ORDER — FUROSEMIDE 80 MG PO TABS: 80.0000 mg | ORAL_TABLET | Freq: Every day | ORAL | Status: DC

## 2013-12-05 NOTE — Progress Notes (Signed)
HPI The patient is a 53 y.o. female with a history of HTN, CHF, CAD, OSA, presenting for an acute visit for left arm pain.  The patient notes a 2-week history of left arm pain, described as a "burning, shooting" pain, which starts at the wrist and travels to the elbow along the anterior surface of her forearm.  Symptoms are worse at night, better during the day.  The patient notes recently starting a new weightlifting routine 1-2 months ago, which involves a significant amount of wrist movement.  The patient has no history of similar symptoms.  The patient has been taking hydrocodone for pain, which helps somewhat.  The patient also notes new left posterior calf pain for the last 4 days.  The patient describes this as a dull pain, which is worse when initially walking, though improves with continued walking.  The pain is not necessarily relieved by rest.  She notes no weakness, numbness, or tingling in her left leg.  LE dopplers 4/16 were negative for DVT, and the patient's last INR was 3.0.  The patient is currently taking Gabapentin, 200 mg TID, which has resolved her "pins and needles" sensation in her left foot.  ROS: General: no fevers, chills, changes in weight, changes in appetite Skin: no rash HEENT: no blurry vision, hearing changes, sore throat Pulm: no dyspnea, coughing, wheezing CV: no chest pain, palpitations, shortness of breath Abd: no abdominal pain, nausea/vomiting, diarrhea/constipation GU: no dysuria, hematuria, polyuria Ext: see HPI Neuro: no weakness, numbness, or tingling  Filed Vitals:   12/05/13 1454  BP: 124/78  Pulse: 77  Temp: 97.3 F (36.3 C)    PEX General: alert, cooperative, and in no apparent distress HEENT: pupils equal round and reactive to light, vision grossly intact, oropharynx clear and non-erythematous  Neck: supple Lungs: clear to ascultation bilaterally, normal work of respiration, no wheezes, rales, ronchi Heart: regular rate and rhythm, no  murmurs, gallops, or rubs Abdomen: soft, non-tender, non-distended, normal bowel sounds Extremities: No pain on palpation of left wrist or elbow, and tinel's test negative.  Left lower leg with no tenderness to palpation, and full active and passive ROM at left knee and wrist.  2+ DP/PT pulses bilaterally. No LE edema. Neurologic: alert & oriented X3, cranial nerves II-XII intact, strength 5/5 throughout, sensation intact to light touch  Current Outpatient Prescriptions on File Prior to Visit  Medication Sig Dispense Refill  . aspirin 81 MG chewable tablet Chew 1 tablet (81 mg total) by mouth daily.  30 tablet  1  . carvedilol (COREG) 6.25 MG tablet Take 1 tablet (6.25 mg total) by mouth 2 (two) times daily with a meal.  60 tablet  11  . Cyanocobalamin (VITAMIN B 12 PO) Take by mouth.      . furosemide (LASIX) 80 MG tablet Take 1 tablet (80 mg total) by mouth daily.  90 tablet  3  . gabapentin (NEURONTIN) 100 MG capsule Take 2 capsules (200 mg total) by mouth 3 (three) times daily.  180 capsule  5  . HYDROcodone-acetaminophen (NORCO) 7.5-325 MG per tablet Take 1 tablet by mouth every 6 (six) hours as needed.  45 tablet  0  . lisinopril (PRINIVIL,ZESTRIL) 10 MG tablet Take 1 tablet (10 mg total) by mouth daily.  90 tablet  3  . pantoprazole (PROTONIX) 40 MG tablet Take 1 tablet 30 minutes before meal (lunch/dinner)  90 tablet  1  . potassium chloride SA (K-DUR,KLOR-CON) 20 MEQ tablet Take 1 tablet (20 mEq total)  by mouth daily.  30 tablet  0  . pravastatin (PRAVACHOL) 40 MG tablet Take 1 tablet (40 mg total) by mouth at bedtime.  30 tablet  11  . warfarin (COUMADIN) 10 MG tablet Take 1/2 tablet daily.  30 tablet  2   No current facility-administered medications on file prior to visit.    Assessment/Plan

## 2013-12-05 NOTE — Assessment & Plan Note (Signed)
The patient's left wrist pain, radiating up the forearm, worse at night, is consistent with carpal tunnel syndrome, likely exacerbated by recent weightlifting. -encouraged use of stiff wrist splint to left wrist at night and while weightlifting -encouraged patient to take a break from weightlifting involving her left wrist for a couple of weeks

## 2013-12-05 NOTE — Assessment & Plan Note (Signed)
The patient noted left calf pain at her last visit, at which time LE dopplers were negative.  She now notes a different left calf pain, which occurs when initially walking, but improves with continued walking.  This likely represents a muscle strain (recent new weightlifting routine, pain improves once muscles have stretched with walking), less likely claudication (risk factors of CAD, but pain improves with walking) vs hematoma (no palpable abnormality or superficial bruising). -encouraged stretching of the area -if pain does not improve, can consider repeat US to look for hematoma, vs ABI to evaluate for PVD (given risk factors)

## 2013-12-05 NOTE — Progress Notes (Signed)
Case discussed with Dr. Brown at the time of the visit.  We reviewed the resident's history and exam and pertinent patient test results.  I agree with the assessment, diagnosis, and plan of care documented in the resident's note. 

## 2013-12-05 NOTE — Patient Instructions (Addendum)
General Instructions: The pain in your arm is due to a condition called Carpal Tunnel Syndrome (see info below). -we recommend getting a stiff wrist splint, to wear at night and while weightlifting -we also recommend avoiding or decreasing your weightlifting activities in your left wrist for a couple of weeks  The pain in your leg may be due to a muscle strain.  If the pain does not resolve, we will re-evaluate this at your next visit  Please return for a follow-up visit in 1 month.  Please bring your medicines with you each time you come to clinic.  Medicines may include prescription medications, over-the-counter medications, herbal remedies, eye drops, vitamins, or other pills.   Progress Toward Treatment Goals:  Treatment Goal 12/05/2013  Blood pressure at goal    Self Care Goals & Plans:  Self Care Goal 11/03/2013  Manage my medications take my medicines as prescribed; bring my medications to every visit; refill my medications on time  Monitor my health -  Eat healthy foods drink diet soda or water instead of juice or soda  Be physically active take a walk every day  Meeting treatment goals -    No flowsheet data found.   Care Management & Community Referrals:  Referral 12/05/2013  Referrals made for care management support none needed       Carpal Tunnel Syndrome The carpal tunnel is a narrow area located on the palm side of your wrist. The tunnel is formed by the wrist bones and ligaments. Nerves, blood vessels, and tendons pass through the carpal tunnel. Repeated wrist motion or certain diseases may cause swelling within the tunnel. This swelling pinches the main nerve in the wrist (median nerve) and causes the painful hand and arm condition called carpal tunnel syndrome. CAUSES   Repeated wrist motions.  Wrist injuries.  Certain diseases like arthritis, diabetes, alcoholism, hyperthyroidism, and kidney failure.  Obesity.  Pregnancy. SYMPTOMS   A "pins and  needles" feeling in your fingers or hand.  Tingling or numbness in your fingers or hand.  An aching feeling in your entire arm.  Wrist pain that goes up your arm to your shoulder.  Pain that goes down into your palm or fingers.  A weak feeling in your hands. DIAGNOSIS  Your caregiver will take your history and perform a physical exam. An electromyography test may be needed. This test measures electrical signals sent out by the muscles. The electrical signals are usually slowed by carpal tunnel syndrome. You may also need X-rays. TREATMENT  Carpal tunnel syndrome may clear up by itself. Your caregiver may recommend a wrist splint or medicine such as a nonsteroidal anti-inflammatory medicine. Cortisone injections may help. Sometimes, surgery may be needed to free the pinched nerve.  HOME CARE INSTRUCTIONS   Take all medicine as directed by your caregiver. Only take over-the-counter or prescription medicines for pain, discomfort, or fever as directed by your caregiver.  If you were given a splint to keep your wrist from bending, wear it as directed. It is important to wear the splint at night. Wear the splint for as long as you have pain or numbness in your hand, arm, or wrist. This may take 1 to 2 months.  Rest your wrist from any activity that may be causing your pain. If your symptoms are work-related, you may need to talk to your employer about changing to a job that does not require using your wrist.  Put ice on your wrist after long periods of wrist  activity.  Put ice in a plastic bag.  Place a towel between your skin and the bag.  Leave the ice on for 15-20 minutes, 03-04 times a day.  Keep all follow-up visits as directed by your caregiver. This includes any orthopedic referrals, physical therapy, and rehabilitation. Any delay in getting necessary care could result in a delay or failure of your condition to heal. SEEK IMMEDIATE MEDICAL CARE IF:   You have new, unexplained  symptoms.  Your symptoms get worse and are not helped or controlled with medicines. MAKE SURE YOU:   Understand these instructions.  Will watch your condition.  Will get help right away if you are not doing well or get worse. Document Released: 07/04/2000 Document Revised: 09/29/2011 Document Reviewed: 05/23/2011 [facility] Patient Information 2014 [place], [place].

## 2013-12-07 NOTE — Progress Notes (Signed)
INTERNAL MEDICINE TEACHING ATTENDING ADDENDUM - Aldine Contes M.D  Duration- indefinite, Indication- DVT, PE, INR- therapeutic. Agree with Dr. Gladstone Pih recommendations as outlined in his note.

## 2013-12-07 NOTE — Progress Notes (Signed)
Anti-Coagulation Progress Note  Breanna Wilkins is a 53 y.o. female who is currently on an anti-coagulation regimen.    RECENT RESULTS: Recent results are below, the most recent result is correlated with a dose of 35 mg. per week: Lab Results  Component Value Date   INR 3.00 12/05/2013   INR 3.00 11/07/2013   INR 2.6 11/03/2013    ANTI-COAG DOSE: Anticoagulation Dose Instructions as of 12/05/2013     [name] Tue Wed Thu Fri Sat   New Dose 5 mg 5 mg 5 mg 5 mg 5 mg 5 mg 5 mg       ANTICOAG SUMMARY: Anticoagulation Episode Summary   Current INR goal 2.0-3.0   Next INR check 01/02/2014   INR from last check 3.00 (12/05/2013)   Weekly max dose    Target end date    [facility]   Preferred lab    Send INR reminders to       Comments         ANTICOAG TODAY: Anticoagulation Summary as of 12/05/2013   INR goal 2.0-3.0   Selected INR 3.00 (12/05/2013)   Next INR check 01/02/2014   Target end date     Anticoagulation Episode Summary   [facility]   Preferred lab    Send INR reminders to    Comments       PATIENT INSTRUCTIONS: Patient Instructions  Patient instructed to take medications as defined in the Anti-coagulation Track section of this encounter.  Patient instructed to take today's dose.  Patient verbalized understanding of these instructions.       FOLLOW-UP Return in 4 weeks (on 01/02/2014) for Follow up INR at 2:45PM.  [name], III Pharm.D., CACP

## 2013-12-07 NOTE — Patient Instructions (Signed)
Patient instructed to take medications as defined in the Anti-coagulation Track section of this encounter.  Patient instructed to take today's dose.  Patient verbalized understanding of these instructions.    

## 2013-12-26 ENCOUNTER — Ambulatory Visit (HOSPITAL_COMMUNITY): Payer: Medicare Other | Attending: Internal Medicine

## 2014-01-16 ENCOUNTER — Ambulatory Visit (INDEPENDENT_AMBULATORY_CARE_PROVIDER_SITE_OTHER): Payer: Medicare Other | Admitting: Pharmacist

## 2014-01-16 DIAGNOSIS — I2699 Other pulmonary embolism without acute cor pulmonale: Secondary | ICD-10-CM

## 2014-01-16 LAB — POCT INR: INR: 3.3

## 2014-01-16 NOTE — Patient Instructions (Signed)
Patient instructed to take medications as defined in the Anti-coagulation Track section of this encounter.  Patient instructed to take today's dose.  Patient verbalized understanding of these instructions.    

## 2014-01-16 NOTE — Progress Notes (Signed)
Anti-Coagulation Progress Note  Breanna Wilkins is a 53 y.o. female who is currently on an anti-coagulation regimen.    RECENT RESULTS: Recent results are below, the most recent result is correlated with a dose of 35 mg. per week: Lab Results  Component Value Date   INR 3.30 01/16/2014   INR 3.00 12/05/2013   INR 3.00 11/07/2013    ANTI-COAG DOSE: Anticoagulation Dose Instructions as of 01/16/2014     [name] Tue Wed Thu Fri Sat   New Dose 5 mg 5 mg 5 mg 5 mg 5 mg 5 mg 5 mg       ANTICOAG SUMMARY: Anticoagulation Episode Summary   Current INR goal 2.0-3.0   Next INR check 02/13/2014   INR from last check 3.30! (01/16/2014)   Weekly max dose    Target end date    [facility]   Preferred lab    Send INR reminders to       Comments         ANTICOAG TODAY: Anticoagulation Summary as of 01/16/2014   INR goal 2.0-3.0   Selected INR 3.30! (01/16/2014)   Next INR check 02/13/2014   Target end date     Anticoagulation Episode Summary   [facility]   Preferred lab    Send INR reminders to    Comments       PATIENT INSTRUCTIONS: Patient Instructions  Patient instructed to take medications as defined in the Anti-coagulation Track section of this encounter.  Patient instructed to take today's dose.  Patient verbalized understanding of these instructions.       FOLLOW-UP Return in 4 weeks (on 02/13/2014) for Follow up INR at [place], III Pharm.D., CACP

## 2014-01-17 NOTE — Progress Notes (Signed)
INTERNAL MEDICINE TEACHING ATTENDING ADDENDUM - Aldine Contes M.D  Duration- indefinite, Indication- DVT, PE, INR- supratherapeutic. Agree with Dr. Gladstone Pih recommendations as outlined in his note.

## 2014-01-19 ENCOUNTER — Encounter: Payer: Self-pay | Admitting: Internal Medicine

## 2014-01-19 ENCOUNTER — Ambulatory Visit (INDEPENDENT_AMBULATORY_CARE_PROVIDER_SITE_OTHER): Payer: Medicare Other | Admitting: Internal Medicine

## 2014-01-19 VITALS — BP 114/79 | HR 81 | Temp 96.8°F | Ht 67.0 in | Wt 301.8 lb

## 2014-01-19 DIAGNOSIS — N649 Disorder of breast, unspecified: Secondary | ICD-10-CM

## 2014-01-19 DIAGNOSIS — G5600 Carpal tunnel syndrome, unspecified upper limb: Secondary | ICD-10-CM

## 2014-01-19 DIAGNOSIS — M79662 Pain in left lower leg: Secondary | ICD-10-CM

## 2014-01-19 DIAGNOSIS — IMO0001 Reserved for inherently not codable concepts without codable children: Secondary | ICD-10-CM

## 2014-01-19 DIAGNOSIS — I10 Essential (primary) hypertension: Secondary | ICD-10-CM

## 2014-01-19 DIAGNOSIS — M79609 Pain in unspecified limb: Secondary | ICD-10-CM

## 2014-01-19 DIAGNOSIS — S46819A Strain of other muscles, fascia and tendons at shoulder and upper arm level, unspecified arm, initial encounter: Secondary | ICD-10-CM

## 2014-01-19 DIAGNOSIS — G4733 Obstructive sleep apnea (adult) (pediatric): Secondary | ICD-10-CM

## 2014-01-19 DIAGNOSIS — G5603 Carpal tunnel syndrome, bilateral upper limbs: Secondary | ICD-10-CM

## 2014-01-19 DIAGNOSIS — L988 Other specified disorders of the skin and subcutaneous tissue: Secondary | ICD-10-CM | POA: Insufficient documentation

## 2014-01-19 DIAGNOSIS — S46819D Strain of other muscles, fascia and tendons at shoulder and upper arm level, unspecified arm, subsequent encounter: Secondary | ICD-10-CM

## 2014-01-19 MED ORDER — GABAPENTIN 300 MG PO CAPS: 300.0000 mg | ORAL_CAPSULE | Freq: Three times a day (TID) | ORAL | Status: DC

## 2014-01-19 MED ORDER — CLOTRIMAZOLE 1 % EX CREA: 1.0000 "application " | TOPICAL_CREAM | Freq: Two times a day (BID) | CUTANEOUS | Status: DC

## 2014-01-19 NOTE — Assessment & Plan Note (Signed)
Using CPAP at home without issue. -Stable

## 2014-01-19 NOTE — Assessment & Plan Note (Signed)
BP Readings from Last 3 Encounters:  01/19/14 114/79  12/05/13 124/78  11/03/13 115/69    Lab Results  Component Value Date   NA 138 10/17/2013   K 4.3 10/17/2013   CREATININE 0.90 10/17/2013    Assessment: Blood pressure control: controlled Progress toward BP goal:  at goal Comments: none  Plan: Medications:   Coreg 6.25 BID, Lisinopril 10mg  daily Educational resources provided:   Self management tools provided:   Other plans: None

## 2014-01-19 NOTE — Progress Notes (Signed)
[facility] Subjective:   Patient ID: Breanna Wilkins female   DOB: April 17, 1961 53 y.o.   MRN: [id number]  HPI: Breanna Wilkins is a 53 y.o. female with a PMH significant for CHF, OSA, PE, CAD, HTN  She presents today with multiple complaints. She notes that she has had a rash on her left nipple.  This started about 2 weeks ago. She notes that it is itchy and that she gets some relief by putting Vaseline on the area.  She has not appreciated any redness or drainage or any lumps in the area.  Of note she is scheduled for mammogram at [facility] hospital on 9th.  She has continued to have some left leg pain for the past few weeks.  Given her history of PE she is always concerned this could be a DVT. Of note her last INR was on 6/29 and was 3.3.  She had a negative LE dopper in April for DVT.  She reports the pain has improved and has not appreciated any associated swelling, redness or tenderness.  She also notes some pain in her hands especially bad at night. She was recently seen by Dr. Owens Shark in the clinic and diagnosed with carpel tunnel syndrome and instructed to start using a wrist splint.  She has not yet picked up this splint from the store but is willing to. Her pains remain unchanged.  Past Medical History  Diagnosis Date  . Shoulder pain   . Pulmonary embolus 01/16/10    on Coumadin indefinitely  . Abdominal hernia   . Hypertension   . Overweight(278.02)   . Lung nodule 12/2009    Very small, left upper lobe by CT June, 2011, one-year followup was stable  . Shortness of breath   . Warfarin anticoagulation   . Ejection fraction     65-70%, vigorous function, echo, March, 2011  . CHF (congestive heart failure)   . OSA (obstructive sleep apnea)     CPAP  . CAD (coronary artery disease)     Perioperative non-STEMI March, 2014  //   cardiac catheterization at time of non-STEMI, March, 2014, minor nonobstructive coronary disease, vigorous LV function, possibly  vasospastic event versus stress cardiomyopathy. No further workup of coronary disease  . Ejection fraction     Vigorous function at time of catheterization October 06, 2012,   Current Outpatient Prescriptions  Medication Sig Dispense Refill  . aspirin 81 MG chewable tablet Chew 1 tablet (81 mg total) by mouth daily.  30 tablet  1  . carvedilol (COREG) 6.25 MG tablet Take 1 tablet (6.25 mg total) by mouth 2 (two) times daily with a meal.  60 tablet  11  . Cyanocobalamin (VITAMIN B 12 PO) Take by mouth.      . furosemide (LASIX) 80 MG tablet Take 1 tablet (80 mg total) by mouth daily.  90 tablet  3  . gabapentin (NEURONTIN) 100 MG capsule Take 2 capsules (200 mg total) by mouth 3 (three) times daily.  180 capsule  5  . HYDROcodone-acetaminophen (NORCO) 7.5-325 MG per tablet Take 1 tablet by mouth every 6 (six) hours as needed.  45 tablet  0  . lisinopril (PRINIVIL,ZESTRIL) 10 MG tablet Take 1 tablet (10 mg total) by mouth daily.  90 tablet  3  . pantoprazole (PROTONIX) 40 MG tablet Take 1 tablet 30 minutes before meal (lunch/dinner)  90 tablet  1  . potassium chloride SA (K-DUR,KLOR-CON) 20 MEQ tablet Take 1 tablet (20  mEq total) by mouth daily.  30 tablet  0  . pravastatin (PRAVACHOL) 40 MG tablet Take 1 tablet (40 mg total) by mouth at bedtime.  30 tablet  11  . warfarin (COUMADIN) 10 MG tablet Take 1/2 tablet daily.  30 tablet  2   No current facility-administered medications for this visit.   Family History  Problem Relation Age of Onset  . Diabetes Father   . Diabetes Sister   . Diabetes Mother   . Deep vein thrombosis Sister   . Ovarian cancer Mother   . Bone cancer Maternal Grandmother    History   Social History  . Marital Status: Single    Spouse Name: N/A    Number of Children: N/A  . Years of Education: N/A   Social History Main Topics  . Smoking status: Former Research scientist (life sciences)  . Smokeless tobacco: None     Comment: qoit 12 years ago  . Alcohol Use: 3.6 oz/week    0 Drinks  containing 0.5 oz of alcohol, 6 Cans of beer per week     Comment: 6 pack of beer  . Drug Use: No     Comment: smoked joint 2 weeks ago  . Sexual Activity: None   Other Topics Concern  . None   Social History Narrative   Financial assistance approved for 100% discount at [facility] and has [facility] card per Bonna Gains   12/25/2009   Review of Systems: Review of Systems  Constitutional: Positive for weight loss. Negative for fever, chills and malaise/fatigue.  Eyes: Negative for blurred vision.  Cardiovascular: Negative for chest pain, palpitations and leg swelling.  Gastrointestinal: Negative for abdominal pain.  Genitourinary: Negative for dysuria.  Musculoskeletal: Negative for myalgias.  Neurological: Positive for tingling (hands and arms especially at night). Negative for headaches.  All other systems reviewed and are negative.   Objective:  Physical Exam: Filed Vitals:   01/19/14 1457  BP: 114/79  Pulse: 81  Temp: 96.8 F (36 C)  TempSrc: Oral  Height: 5\' 7"  (1.702 m)  Weight: 301 lb 12.8 oz (136.896 kg)  SpO2: 98%  Physical Exam  Nursing note and vitals reviewed. Constitutional: She is oriented to person, place, and time and well-developed, well-nourished, and in no distress.  Eyes: EOM are normal.  proptosis  Cardiovascular: Normal rate, regular rhythm and normal heart sounds.   Pulmonary/Chest: Effort normal and breath sounds normal. She has no rales. Right breast exhibits no inverted nipple, no mass, no skin change and no tenderness. Left breast exhibits skin change. Left breast exhibits no inverted nipple, no mass, no nipple discharge and no tenderness. Breasts are symmetrical.    Abdominal: Soft. Bowel sounds are normal.  Musculoskeletal: She exhibits edema (trace). She exhibits no tenderness (no calf tenderness).  phalen's test negative  Neurological: She is alert and oriented to person, place, and time.  Skin: Skin is warm and dry.     Assessment & Plan:  Case  discussed with Dr. Daryll Drown See Problem Based Assessment and Plan Medications Ordered Meds ordered this encounter  Medications  . gabapentin (NEURONTIN) 300 MG capsule    Sig: Take 1 capsule (300 mg total) by mouth 3 (three) times daily.    Dispense:  90 capsule    Refill:  5    Please discontinue any other gabapentin Rx.  . clotrimazole (LOTRIMIN) 1 % cream    Sig: Apply 1 application topically 2 (two) times daily.    Dispense:  30 g  Refill:  0   Other Orders Orders Placed This Encounter  Procedures  . MM Digital Diagnostic Bilat    EPIC ORDER PF 12/23/12 BCG    NO NEEDS   NP/LELA   UHC/MCR     Standing Status: Future     Number of Occurrences:      Standing Expiration Date: 03/23/2015    Order Specific Question:  Reason for Exam (SYMPTOM  OR DIAGNOSIS REQUIRED)    Answer:  left nipple skin lesion    Order Specific Question:  Is the patient pregnant?    Answer:  No    Order Specific Question:  Preferred imaging location?    Answer:  [facility]  . US Breast Bilateral    EPIC ORDER PF 12/23/12 BCG NO NEEDS NP/LELA UHC/MCR      Standing Status: Future     Number of Occurrences:      Standing Expiration Date: 03/23/2015    Order Specific Question:  Reason for Exam (SYMPTOM  OR DIAGNOSIS REQUIRED)    Answer:  left nipple skin lesion    Order Specific Question:  Preferred imaging location?    Answer:  [facility]

## 2014-01-19 NOTE — Assessment & Plan Note (Signed)
Patient doing very well with Norco for should pain and has been increasing her exercising in attempt to lose weight. Will continue Norco at current dose.

## 2014-01-19 NOTE — Assessment & Plan Note (Signed)
Improved from previous. Patient was slightly supratheraputic on warfarin a few days ago.  I reassured her that this does not appear to be a DVT (has had recent dopplers and has maintained a therapeutic INR) and that she should continue warfarin as directed.

## 2014-01-19 NOTE — Assessment & Plan Note (Addendum)
Lesion does not have an appearance of bacterial infection.  It is pruritic and has some features consistent with a fungal infection however does not have satellite lesion and is in an odd location.  I cannot appreciated any discrete lumps or other abnormality on exam. Patient was due for her screening mammogram but given his odd lesion will change to a diagnostic mammogram and order an Ultrasound to evaluate for possible occult malignancy. Also given Clotrimazole to apply in case this is of fungal origin. Patient given instructions to return sooner if getting worse.

## 2014-01-19 NOTE — Patient Instructions (Addendum)
General Instructions: Continue your current medications. The only change we have made today is to increase Gabapentin to 300mg  three times a day. Please try to buy the wrist splints to wear at night.  Apply the cream to the affected area as directed.  Please keep your appointment for Mammogram at [facility] hospital  Please try to bring all your medicines next time. This will help [place] keep you safe from mistakes.   Progress Toward Treatment Goals:  Treatment Goal 01/19/2014  Blood pressure at goal    Self Care Goals & Plans:  Self Care Goal 01/19/2014  Manage my medications take my medicines as prescribed; bring my medications to every visit; refill my medications on time  Monitor my health keep track of my blood glucose  Eat healthy foods drink diet soda or water instead of juice or soda  Be physically active take a walk every day  Meeting treatment goals maintain the current self-care plan    No flowsheet data found.   Care Management & Community Referrals:  Referral 01/19/2014  Referrals made for care management support none needed

## 2014-01-19 NOTE — Assessment & Plan Note (Signed)
Patient's symptoms seem consistent with carpel tunnel syndrome.  Again encouraged patient to purchase cock-up wrist splints to wear at night. Will increase Gabapentin to 300mg  TID.

## 2014-01-22 NOTE — Progress Notes (Signed)
I saw and evaluated the patient.  I personally confirmed the key portions of the history and exam documented by Dr. Hoffman and I reviewed pertinent patient test results.  The assessment, diagnosis, and plan were formulated together and I agree with the documentation in the resident's note. 

## 2014-01-26 ENCOUNTER — Ambulatory Visit (HOSPITAL_COMMUNITY): Payer: Medicare Other | Attending: Internal Medicine

## 2014-01-30 ENCOUNTER — Ambulatory Visit
Admission: RE | Admit: 2014-01-30 | Discharge: 2014-01-30 | Disposition: A | Discharge status: Home / Self Care | Payer: Medicare Other | Source: Ambulatory Visit | Attending: Internal Medicine | Admitting: Internal Medicine

## 2014-01-30 ENCOUNTER — Encounter (INDEPENDENT_AMBULATORY_CARE_PROVIDER_SITE_OTHER): Payer: Self-pay

## 2014-01-30 ENCOUNTER — Other Ambulatory Visit: Payer: Self-pay | Admitting: Internal Medicine

## 2014-01-30 DIAGNOSIS — L988 Other specified disorders of the skin and subcutaneous tissue: Secondary | ICD-10-CM

## 2014-01-30 DIAGNOSIS — N649 Disorder of breast, unspecified: Secondary | ICD-10-CM

## 2014-02-07 ENCOUNTER — Telehealth: Payer: Self-pay | Admitting: *Deleted

## 2014-02-07 ENCOUNTER — Encounter: Payer: Self-pay | Admitting: Internal Medicine

## 2014-02-07 ENCOUNTER — Ambulatory Visit (HOSPITAL_COMMUNITY)
Admission: RE | Admit: 2014-02-07 | Discharge: 2014-02-07 | Disposition: A | Discharge status: Home / Self Care | Payer: Medicare Other | Source: Ambulatory Visit | Attending: Internal Medicine | Admitting: Internal Medicine

## 2014-02-07 ENCOUNTER — Ambulatory Visit (INDEPENDENT_AMBULATORY_CARE_PROVIDER_SITE_OTHER): Payer: Medicare Other | Admitting: Internal Medicine

## 2014-02-07 VITALS — BP 120/77 | HR 85 | Temp 97.3°F | Wt 304.4 lb

## 2014-02-07 DIAGNOSIS — M7730 Calcaneal spur, unspecified foot: Secondary | ICD-10-CM | POA: Insufficient documentation

## 2014-02-07 DIAGNOSIS — M79609 Pain in unspecified limb: Secondary | ICD-10-CM | POA: Insufficient documentation

## 2014-02-07 DIAGNOSIS — M722 Plantar fascial fibromatosis: Secondary | ICD-10-CM

## 2014-02-07 DIAGNOSIS — Z7901 Long term (current) use of anticoagulants: Secondary | ICD-10-CM

## 2014-02-07 LAB — POCT INR: INR: 2.9

## 2014-02-07 NOTE — Telephone Encounter (Signed)
Pt called with c/o pressure feeling to heep of Rt foot.  It hurts to walk but improves after she is up for awhile.  Pain returns after she sits and gets up again. She got inserts for shoes without improvement.  Also she has a bruise on side arm, under leg and in back of the other leg.  One bruise has a  knott. Area's are large and purple. Pt is on coumadin and scheduled to see Dr Elie Confer on 7/27.   Last INR was 3.3  Will see today at 2:30

## 2014-02-07 NOTE — Telephone Encounter (Signed)
Thank you... I agree with the follow up today to evaluate her

## 2014-02-07 NOTE — Assessment & Plan Note (Addendum)
Assessment -Differential includes plantar fasciitis (possible given the nature of her pain and how it improves, acuity), hairline fracture (possible with sudden increase in exercise but less likely given XR findings), tendon rupture (possible given acuity, onset, location of pain but less likely given nature of pain), adverse effect of gabapentin (possible given onset but less likely given unilaterality and the rarity reported in the literature) -Foot XR: soft tissue thickening in proximal plantar fascia  Plan -Educated patient about exercises for tendon and to use cycling machine for cardiovascular exercise if she can tolerate it instead of walking -Advised against NSAIDs given her anticoagulation -Advised to f/u if symptoms do not improve in 2-3 weeks

## 2014-02-07 NOTE — Assessment & Plan Note (Addendum)
Assessment -Bruising possibly indicative of a supratherapeutic INR level (>3) but INR today is 2.9 so likely she injured herself but couldn't remember.  Plan -Advised patient to monitor for signs of worsening bleeding (more bruising, nose bleeding 2/2 nose picking) -Scheduled to see Dr. Elie Confer on 7/27; may need to change her target

## 2014-02-07 NOTE — Patient Instructions (Addendum)
Thank you for bringing your medicines today. This helps [place] keep you safe from mistakes.  Please follow-up with Dr. Ulice Dash on Monday, and call if you have more bruises or random bleeding.   Plantar Fasciitis Plantar fasciitis is a common condition that causes foot pain. It is soreness (inflammation) of the band of tough fibrous tissue on the bottom of the foot that runs from the heel bone (calcaneus) to the ball of the foot. The cause of this soreness may be from excessive standing, poor fitting shoes, running on hard surfaces, being overweight, having an abnormal walk, or overuse (this is common in runners) of the painful foot or feet. It is also common in aerobic exercise dancers and ballet dancers. SYMPTOMS  Most people with plantar fasciitis complain of:  Severe pain in the morning on the bottom of their foot especially when taking the first steps out of bed. This pain recedes after a few minutes of walking.  Severe pain is experienced also during walking following a long period of inactivity.  Pain is worse when walking barefoot or up stairs DIAGNOSIS   Your caregiver will diagnose this condition by examining and feeling your foot.  Special tests such as X-rays of your foot, are usually not needed. PREVENTION   Consult a sports medicine professional before beginning a new exercise program.  Walking programs offer a good workout. With walking there is a lower chance of overuse injuries common to runners. There is less impact and less jarring of the joints.  Begin all new exercise programs slowly. If problems or pain develop, decrease the amount of time or distance until you are at a comfortable level.  Wear good shoes and replace them regularly.  Stretch your foot and the heel cords at the back of the ankle (Achilles tendon) both before and after exercise.  Run or exercise on even surfaces that are not hard. For example, asphalt is better than pavement.  Do not run barefoot on hard  surfaces.  If using a treadmill, vary the incline.  Do not continue to workout if you have foot or joint problems. Seek professional help if they do not improve. HOME CARE INSTRUCTIONS   Avoid activities that cause you pain until you recover.  Use ice or cold packs on the problem or painful areas after working out.  Only take over-the-counter or prescription medicines for pain, discomfort, or fever as directed by your caregiver.  Soft shoe inserts or athletic shoes with air or gel sole cushions may be helpful.  If problems continue or become more severe, consult a sports medicine caregiver or your own health care provider. Cortisone is a potent anti-inflammatory medication that may be injected into the painful area. You can discuss this treatment with your caregiver. MAKE SURE YOU:   Understand these instructions.  Will watch your condition.  Will get help right away if you are not doing well or get worse. Document Released: 04/01/2001 Document Revised: 09/29/2011 Document Reviewed: 05/31/2008 [facility] Patient Information 2015 [place], [place]. This information is not intended to replace advice given to you by your health care provider. Make sure you discuss any questions you have with your health care provider.

## 2014-02-07 NOTE — Progress Notes (Signed)
   Subjective:    Patient ID: Breanna Wilkins, female    DOB: 08/25/1960, 53 y.o.   MRN: [id number]  HPI Breanna Wilkins is a 53 year old female with CHF, CAD, HTN, OSA, a history of PE who presents today for right foot pain and bruising.  Right foot pain: Her pain began after her last visit where her gabapentin was increased to 200->300mg  TID. It feels as though something is pushing against the heel of her foot and like a "muscle spasm." It's worse when with palpation and when she gets up in the morning but eases off a bit with walking. The pain prevents her from exercising; she used to go 5 days/week but has only been going 2 days/week for the last month. She denies pain at night and recent injuries, h/o injuries, h/o surgeries on her right foot.  Bruising: She has bruising on her upper left arm and posterior right calf. Both lesions have a "knot" that can be palpated. She was last seen by Dr. Elie Confer on 6/29 at which point her INR was 3.3; she takes warfarin 5mg  QD. She denies any recent falls or injuries to those areas but wonders if eating green, leafy vegetables might have affected her INR recently.  Today, she denies chest pain, shortness of breath, fever.   Review of Systems  Constitutional: Negative for fever.  Respiratory: Negative for shortness of breath.   Cardiovascular: Negative for chest pain and leg swelling.  Musculoskeletal:       Right heel pain  Skin: Negative for color change.  All other systems reviewed and are negative.      Objective:   Physical Exam  Constitutional: She is oriented to person, place, and time. She appears well-developed and well-nourished. No distress.  HENT:  Head: Normocephalic and atraumatic.  Eyes: Conjunctivae are normal. Pupils are equal, round, and reactive to light.  Cardiovascular: Normal rate, regular rhythm and normal heart sounds.  Exam reveals no gallop and no friction rub.   No murmur heard. Pulmonary/Chest: Effort normal and breath  sounds normal. No respiratory distress. She has no wheezes. She has no rales.  Neurological: She is alert and oriented to person, place, and time. No cranial nerve deficit.  Skin: She is not diaphoretic.  Bruises present on right posterior calf and left upper arm. Palpable knot though more prominent on calf than arm. Cool to touch. No pain with palpation.  Psychiatric: She has a normal mood and affect. Her behavior is normal.          Assessment & Plan:

## 2014-02-09 NOTE — Progress Notes (Signed)
I saw and evaluated the patient.  I personally confirmed the key portions of the history and exam documented by Dr. Patel and I reviewed pertinent patient test results.  The assessment, diagnosis, and plan were formulated together and I agree with the documentation in the resident's note. 

## 2014-02-13 ENCOUNTER — Ambulatory Visit (INDEPENDENT_AMBULATORY_CARE_PROVIDER_SITE_OTHER): Payer: Medicare Other | Admitting: Pharmacist

## 2014-02-13 DIAGNOSIS — Z86718 Personal history of other venous thrombosis and embolism: Secondary | ICD-10-CM

## 2014-02-13 DIAGNOSIS — Z7902 Long term (current) use of antithrombotics/antiplatelets: Secondary | ICD-10-CM

## 2014-02-13 LAB — POCT INR: INR: 3.2

## 2014-02-13 MED ORDER — WARFARIN SODIUM 10 MG PO TABS: ORAL_TABLET | ORAL | Status: DC

## 2014-02-13 NOTE — Patient Instructions (Signed)
Patient instructed to take medications as defined in the Anti-coagulation Track section of this encounter.  Patient instructed to take today's dose.  Patient verbalized understanding of these instructions.    

## 2014-02-13 NOTE — Progress Notes (Signed)
Anti-Coagulation Progress Note  Breanna Wilkins is a 53 y.o. female who is currently on an anti-coagulation regimen.    RECENT RESULTS: Recent results are below, the most recent result is correlated with a dose of 35 mg. per week: Lab Results  Component Value Date   INR 3.2 02/13/2014   INR 2.9 02/07/2014   INR 3.30 01/16/2014    ANTI-COAG DOSE: Anticoagulation Dose Instructions as of 02/13/2014     [name] Tue Wed Thu Fri Sat   New Dose 5 mg 5 mg 5 mg 5 mg 5 mg 5 mg 5 mg       ANTICOAG SUMMARY: Anticoagulation Episode Summary   Current INR goal 2.0-3.0   Next INR check 03/06/2014   INR from last check 3.2! (02/13/2014)   Weekly max dose    Target end date    [facility]   Preferred lab    Send INR reminders to       Comments         ANTICOAG TODAY: Anticoagulation Summary as of 02/13/2014   INR goal 2.0-3.0   Selected INR 3.2! (02/13/2014)   Next INR check 03/06/2014   Target end date     Anticoagulation Episode Summary   [facility]   Preferred lab    Send INR reminders to    Comments       PATIENT INSTRUCTIONS: Patient Instructions  Patient instructed to take medications as defined in the Anti-coagulation Track section of this encounter.  Patient instructed to take today's dose.  Patient verbalized understanding of these instructions.       FOLLOW-UP Return in 3 weeks (on 03/06/2014) for Follow up INR at 3:15PM.  Breanna Wilkins, III Pharm.D., CACP

## 2014-02-20 ENCOUNTER — Other Ambulatory Visit: Payer: Self-pay | Admitting: *Deleted

## 2014-02-21 MED ORDER — HYDROCODONE-ACETAMINOPHEN 7.5-325 MG PO TABS: 1.0000 | ORAL_TABLET | Freq: Four times a day (QID) | ORAL | Status: DC | PRN

## 2014-02-21 NOTE — Telephone Encounter (Signed)
Pt informed

## 2014-02-21 NOTE — Telephone Encounter (Signed)
Refilled 3 month Rx, has upcoming appointment on 02/23/14

## 2014-02-23 ENCOUNTER — Encounter: Payer: Medicare Other | Admitting: Internal Medicine

## 2014-02-23 ENCOUNTER — Encounter: Payer: Self-pay | Admitting: Internal Medicine

## 2014-02-23 ENCOUNTER — Ambulatory Visit (HOSPITAL_COMMUNITY)
Admission: RE | Admit: 2014-02-23 | Discharge: 2014-02-23 | Disposition: A | Discharge status: Home / Self Care | Payer: Medicare Other | Source: Ambulatory Visit | Attending: Internal Medicine | Admitting: Internal Medicine

## 2014-02-23 ENCOUNTER — Ambulatory Visit (INDEPENDENT_AMBULATORY_CARE_PROVIDER_SITE_OTHER): Payer: Medicare Other | Admitting: Internal Medicine

## 2014-02-23 VITALS — BP 121/74 | HR 84 | Temp 97.1°F | Ht 67.0 in | Wt 307.3 lb

## 2014-02-23 DIAGNOSIS — I10 Essential (primary) hypertension: Secondary | ICD-10-CM

## 2014-02-23 DIAGNOSIS — L988 Other specified disorders of the skin and subcutaneous tissue: Secondary | ICD-10-CM

## 2014-02-23 DIAGNOSIS — R0789 Other chest pain: Secondary | ICD-10-CM | POA: Insufficient documentation

## 2014-02-23 DIAGNOSIS — N649 Disorder of breast, unspecified: Secondary | ICD-10-CM

## 2014-02-23 DIAGNOSIS — R079 Chest pain, unspecified: Secondary | ICD-10-CM | POA: Diagnosis present

## 2014-02-23 NOTE — Patient Instructions (Signed)
General Instructions:  We will call with an appointment with Dermatology.  Thank you for bringing your medicines today. This helps [place] keep you safe from mistakes.   Progress Toward Treatment Goals:  Treatment Goal 01/19/2014  Blood pressure at goal    Self Care Goals & Plans:  Self Care Goal 02/23/2014  Manage my medications take my medicines as prescribed; bring my medications to every visit; refill my medications on time  Monitor my health -  Eat healthy foods -  Be physically active take a walk every day  Meeting treatment goals maintain the current self-care plan    No flowsheet data found.   Care Management & Community Referrals:  Referral 01/19/2014  Referrals made for care management support none needed

## 2014-02-23 NOTE — Assessment & Plan Note (Signed)
Patient's chest pain is not consistent with a cardiac cause by history although it is concerning that she reports it was relieved by aspirin.  She does have a history of peri-operative NSTEMI but subsequent cath showed non obstructive CAD.  EKG obtained in office which shows non specific ST changes (TWI in lead V2, which was present on previous EKG).  Given patient's symptoms have resolved and low suspicion of cardiac cause of chest pain will not pursue further investigation at this time.  However patient has informed the chest pain (expecially with exertion), SOB, nausea, vomiting all would be reasons for her to return to the ED for evaluation of chest pain.

## 2014-02-23 NOTE — Assessment & Plan Note (Signed)
Area appears to be less raised compared to last visit.  Will place referral for evaluation by a dermatologist.

## 2014-02-23 NOTE — Assessment & Plan Note (Signed)
BP Readings from Last 3 Encounters:  02/23/14 121/74  02/07/14 120/77  01/19/14 114/79    Lab Results  Component Value Date   NA 138 10/17/2013   K 4.3 10/17/2013   CREATININE 0.90 10/17/2013    Assessment: Blood pressure control:  controlled Progress toward BP goal:   at goal Comments:   Plan: Medications:  Lisinopril 10mg  daily, Coreg 6.25mg  BID Educational resources provided:   Self management tools provided:   Other plans:

## 2014-02-23 NOTE — Progress Notes (Signed)
[facility] Subjective:   Patient ID: Breanna Wilkins female   DOB: [birth year] 53 y.o.   MRN: [id number]  HPI: [name] is a 53 y.o. female with a PMH significant for CHF, OSA, PE, CAD, HTN  She presents today for follow up of a rash on her left nipple. The rash was first noticed a few weeks ago.  When she was here last we referred her for a diagnostic mammogram to r/o underlying malignancy and prescribed her some antifungal cream to try to use on the area.  The mammogram was negative for changes consistent with malignancy and recommended dermatology follow up for her skin lesion.  Today she reports that the area has not changed much over the past few weeks.  The antifungal cream did not help much but that now the area is only occasionally itching.  She also reports today that she had an episode of chest pain last night.  She reports the pain started in her left and right chest (she points to about the 3rd ICS sternal boarder on each side).  She reports the pain was sharp and did not radiate.  She notes that it was not worsened by exertion.  She reports she had not taken her Norco or Gabapentin in a few days but also remembered that she had not taken her ASA.  She reports that she took her baby aspirin after about an hour of the pain and the pain resolved 30 mins after taking the ASA.  She notes no associated SOB, diaphoresis, nausea, heartburn.  She reports the pain was not present when she got up this morning and has not returned.  Past Medical History  Diagnosis Date  . Shoulder pain   . Pulmonary embolus 01/16/10    on Coumadin indefinitely  . Abdominal hernia   . Hypertension   . Overweight(278.02)   . Lung nodule 12/2009    Very small, left upper lobe by CT June, 2011, one-year followup was stable  . Shortness of breath   . Warfarin anticoagulation   . Ejection fraction     65-70%, vigorous function, echo, March, 2011  . CHF (congestive heart failure)    . OSA (obstructive sleep apnea)     CPAP  . CAD (coronary artery disease)     Perioperative non-STEMI March, 2014  //   cardiac catheterization at time of non-STEMI, March, 2014, minor nonobstructive coronary disease, vigorous LV function, possibly vasospastic event versus stress cardiomyopathy. No further workup of coronary disease  . Ejection fraction     Vigorous function at time of catheterization October 06, 2012,   Current Outpatient Prescriptions  Medication Sig Dispense Refill  . aspirin 81 MG chewable tablet Chew 1 tablet (81 mg total) by mouth daily.  30 tablet  1  . carvedilol (COREG) 6.25 MG tablet Take 1 tablet (6.25 mg total) by mouth 2 (two) times daily with a meal.  60 tablet  11  . clotrimazole (LOTRIMIN) 1 % cream Apply 1 application topically 2 (two) times daily.  30 g  0  . Cyanocobalamin (VITAMIN B 12 PO) Take by mouth.      . furosemide (LASIX) 80 MG tablet Take 1 tablet (80 mg total) by mouth daily.  90 tablet  3  . gabapentin (NEURONTIN) 300 MG capsule Take 1 capsule (300 mg total) by mouth 3 (three) times daily.  90 capsule  5  . HYDROcodone-acetaminophen (NORCO) 7.5-325 MG per tablet Take 1 tablet by mouth  every 6 (six) hours as needed.  45 tablet  0  . lisinopril (PRINIVIL,ZESTRIL) 10 MG tablet Take 1 tablet (10 mg total) by mouth daily.  90 tablet  3  . pantoprazole (PROTONIX) 40 MG tablet Take 1 tablet 30 minutes before meal (lunch/dinner)  90 tablet  1  . potassium chloride SA (K-DUR,KLOR-CON) 20 MEQ tablet Take 1 tablet (20 mEq total) by mouth daily.  30 tablet  0  . pravastatin (PRAVACHOL) 40 MG tablet Take 1 tablet (40 mg total) by mouth at bedtime.  30 tablet  11  . warfarin (COUMADIN) 10 MG tablet Take 1/2 tablet daily.  30 tablet  2   No current facility-administered medications for this visit.   Family History  Problem Relation Age of Onset  . Diabetes Father   . Diabetes Sister   . Diabetes Mother   . Deep vein thrombosis Sister   . Ovarian cancer  Mother   . Bone cancer Maternal Grandmother    History   Social History  . Marital Status: Single    Spouse Name: N/A    Number of Children: N/A  . Years of Education: N/A   Social History Main Topics  . Smoking status: Former Research scientist (life sciences)  . Smokeless tobacco: None     Comment: qoit 12 years ago  . Alcohol Use: 3.6 oz/week    0 Drinks containing 0.5 oz of alcohol, 6 Cans of beer per week     Comment: 6 pack of beer  . Drug Use: No     Comment: smoked joint 2 weeks ago  . Sexual Activity: None   Other Topics Concern  . None   Social History Narrative   Financial assistance approved for 100% discount at [facility] and has [facility] card per [employer]   12/25/2009   Review of Systems: Review of Systems  Constitutional: Negative for fever, chills, weight loss and malaise/fatigue.  Eyes: Negative for blurred vision.  Respiratory: Negative for shortness of breath.   Cardiovascular: Positive for chest pain. Negative for palpitations and leg swelling.  Gastrointestinal: Negative for abdominal pain.  Genitourinary: Negative for dysuria.  Musculoskeletal: Negative for myalgias.  Neurological: Positive for tingling (occasional, gabapentin helps). Negative for dizziness and headaches.  All other systems reviewed and are negative.   Objective:  Physical Exam: Filed Vitals:   02/23/14 1455  BP: 121/74  Pulse: 84  Temp: 97.1 F (36.2 C)  TempSrc: Oral  Height: 5\' 7"  (1.702 m)  Weight: 307 lb 4.8 oz (139.39 kg)  SpO2: 99%  Physical Exam  Nursing note and vitals reviewed. Constitutional: She is oriented to person, place, and time and well-developed, well-nourished, and in no distress.  Eyes: EOM are normal.  proptosis  Cardiovascular: Normal rate, regular rhythm and normal heart sounds.   Pulmonary/Chest: Effort normal and breath sounds normal. She has no rales. Right breast exhibits no inverted nipple, no mass, no skin change and no tenderness. Left breast exhibits skin change. Left  breast exhibits no inverted nipple, no mass, no nipple discharge and no tenderness. Breasts are symmetrical.    Abdominal: Soft. Bowel sounds are normal.  Musculoskeletal: She exhibits edema (1+). She exhibits no tenderness.  Neurological: She is alert and oriented to person, place, and time.  Skin: Skin is warm and dry.     Assessment & Plan:  Case discussed with Dr. Daryll Drown See Problem Based Assessment and Plan Medications Ordered No orders of the defined types were placed in this encounter.   Other Orders  Orders Placed This Encounter  Procedures  . BMP with Estimated GFR ([id number])  . Ambulatory referral to Dermatology    Referral Priority:  Routine    Referral Type:  Consultation    Referral Reason:  Specialty Services Required    Requested Specialty:  Dermatology    Number of Visits Requested:  1  . EKG 12-Lead

## 2014-02-24 LAB — BASIC METABOLIC PANEL WITH GFR
BUN: 13 mg/dL (ref 6–23)
CHLORIDE: 100 meq/L (ref 96–112)
CO2: 31 mEq/L (ref 19–32)
Calcium: 9.8 mg/dL (ref 8.4–10.5)
Creat: 0.83 mg/dL (ref 0.50–1.10)
GFR, Est African American: >89 mL/min
GFR, Est Non African American: 80 mL/min
Glucose, Bld: 75 mg/dL (ref 70–99)
Potassium: 4.1 mEq/L (ref 3.5–5.3)
Sodium: 138 mEq/L (ref 135–145)

## 2014-02-27 NOTE — Progress Notes (Signed)
I saw and evaluated the patient.  I personally confirmed the key portions of the history and exam documented by Dr. Hoffman and I reviewed pertinent patient test results.  The assessment, diagnosis, and plan were formulated together and I agree with the documentation in the resident's note. 

## 2014-03-06 ENCOUNTER — Ambulatory Visit (INDEPENDENT_AMBULATORY_CARE_PROVIDER_SITE_OTHER): Payer: Medicare Other | Admitting: Pharmacist

## 2014-03-06 DIAGNOSIS — Z7901 Long term (current) use of anticoagulants: Secondary | ICD-10-CM

## 2014-03-06 LAB — POCT INR: INR: 4.2

## 2014-03-06 NOTE — Patient Instructions (Signed)
Patient instructed to take medications as defined in the Anti-coagulation Track section of this encounter.  Patient instructed to take today's dose.  Patient verbalized understanding of these instructions.    

## 2014-03-06 NOTE — Progress Notes (Signed)
Anti-Coagulation Progress Note  Breanna Wilkins is a 53 y.o. female who is currently on an anti-coagulation regimen.    RECENT RESULTS: Recent results are below, the most recent result is correlated with a dose of 35 mg. per week: Lab Results  Component Value Date   INR 4.20 03/06/2014   INR 3.2 02/13/2014   INR 2.9 02/07/2014    ANTI-COAG DOSE: Anticoagulation Dose Instructions as of 03/06/2014     [name] Tue Wed Thu Fri Sat   New Dose 5 mg 0 mg 5 mg 5 mg 5 mg 5 mg 5 mg       ANTICOAG SUMMARY: Anticoagulation Episode Summary   Current INR goal 2.0-3.0   Next INR check 04/03/2014   INR from last check 4.20! (03/06/2014)   Weekly max dose    Target end date    [facility]   Preferred lab    Send INR reminders to       Comments         ANTICOAG TODAY: Anticoagulation Summary as of 03/06/2014   INR goal 2.0-3.0   Selected INR 4.20! (03/06/2014)   Next INR check 04/03/2014   Target end date     Anticoagulation Episode Summary   [facility]   Preferred lab    Send INR reminders to    Comments       PATIENT INSTRUCTIONS: Patient Instructions  Patient instructed to take medications as defined in the Anti-coagulation Track section of this encounter.  Patient instructed to take today's dose.  Patient verbalized understanding of these instructions.       FOLLOW-UP Return in 4 weeks (on 04/03/2014) for Follow up INR at 2:15PM.  [name], III Pharm.D., CACP

## 2014-03-07 NOTE — Progress Notes (Signed)
INTERNAL MEDICINE TEACHING ATTENDING ADDENDUM - Aldine Contes M.D  Duration- indefinite, Indication- DVT, PE, INR- supratherapeutic. Agree with Dr. Gladstone Pih recommendations as outlined in his note.

## 2014-03-20 ENCOUNTER — Other Ambulatory Visit: Payer: Self-pay | Admitting: *Deleted

## 2014-03-22 MED ORDER — PANTOPRAZOLE SODIUM 40 MG PO TBEC: DELAYED_RELEASE_TABLET | ORAL | Status: DC

## 2014-04-03 ENCOUNTER — Ambulatory Visit (INDEPENDENT_AMBULATORY_CARE_PROVIDER_SITE_OTHER): Payer: Medicare Other | Admitting: Pharmacist

## 2014-04-03 DIAGNOSIS — Z7902 Long term (current) use of antithrombotics/antiplatelets: Secondary | ICD-10-CM

## 2014-04-03 DIAGNOSIS — Z86718 Personal history of other venous thrombosis and embolism: Secondary | ICD-10-CM

## 2014-04-03 LAB — POCT INR: INR: 2.7

## 2014-04-03 NOTE — Progress Notes (Signed)
Anti-Coagulation Progress Note  Breanna Wilkins is a 53 y.o. female who is currently on an anti-coagulation regimen.    RECENT RESULTS: Recent results are below, the most recent result is correlated with a dose of 30 mg. per week: Lab Results  Component Value Date   INR 2.70 04/03/2014   INR 4.20 03/06/2014   INR 3.2 02/13/2014    ANTI-COAG DOSE: Anticoagulation Dose Instructions as of 04/03/2014     [name] Tue Wed Thu Fri Sat   New Dose 5 mg 0 mg 5 mg 5 mg 5 mg 5 mg 5 mg       ANTICOAG SUMMARY: Anticoagulation Episode Summary   Current INR goal 2.0-3.0   Next INR check 05/01/2014   INR from last check 2.70 (04/03/2014)   Weekly max dose    Target end date    [facility]   Preferred lab    Send INR reminders to       Comments         ANTICOAG TODAY: Anticoagulation Summary as of 04/03/2014   INR goal 2.0-3.0   Selected INR 2.70 (04/03/2014)   Next INR check 05/01/2014   Target end date     Anticoagulation Episode Summary   [facility]   Preferred lab    Send INR reminders to    Comments       PATIENT INSTRUCTIONS: Patient Instructions  Patient instructed to take medications as defined in the Anti-coagulation Track section of this encounter.  Patient instructed to OMIT every Monday's dose.  Patient verbalized understanding of these instructions.       FOLLOW-UP Return in 4 weeks (on 05/01/2014) for Follow up INR at 2:30PM.  [name], III Pharm.D., CACP

## 2014-04-03 NOTE — Patient Instructions (Signed)
Patient instructed to take medications as defined in the Anti-coagulation Track section of this encounter.  Patient instructed to OMIT every Monday's dose.  Patient verbalized understanding of these instructions.

## 2014-04-07 ENCOUNTER — Other Ambulatory Visit: Payer: Self-pay | Admitting: Dermatology

## 2014-04-24 NOTE — Progress Notes (Signed)
Patient is on anticoagulation for PE/DVT.  Prefers to stay on anticoagulation lifelong. INR 2.7.  I have reviewed Dr. Gladstone Pih note.

## 2014-04-28 ENCOUNTER — Encounter: Payer: Self-pay | Admitting: Cardiology

## 2014-04-28 ENCOUNTER — Ambulatory Visit (INDEPENDENT_AMBULATORY_CARE_PROVIDER_SITE_OTHER): Payer: Medicare Other | Admitting: Cardiology

## 2014-04-28 VITALS — BP 134/70 | HR 74 | Ht 67.0 in | Wt 313.1 lb

## 2014-04-28 DIAGNOSIS — R0789 Other chest pain: Secondary | ICD-10-CM

## 2014-04-28 DIAGNOSIS — M79671 Pain in right foot: Secondary | ICD-10-CM | POA: Insufficient documentation

## 2014-04-28 DIAGNOSIS — I2510 Atherosclerotic heart disease of native coronary artery without angina pectoris: Secondary | ICD-10-CM

## 2014-04-28 NOTE — Assessment & Plan Note (Signed)
She has pain in her right foot. I wonder if this could be plantar fasciitis or a bone spur. She will talk with her primary physician further.

## 2014-04-28 NOTE — Progress Notes (Signed)
Patient ID: Breanna Wilkins, female   DOB: 10-14-1960, 53 y.o.   MRN: [id number]    HPI  Patient is seen today to followup cardiac status. I saw her last October, 2014. Prior to that she had some enzyme elevation at the time of bowel surgery. Cardiac cath revealed only minor nonobstructive disease. LV function was vigorous. It was felt that she may have had spasm or stress cardiomyopathy with rapid improvement. Her cardiac status is stable. She has pain in her right heel when walking. She's had some vague chest discomfort at rest it does not sound cardiac in origin.  No Known Allergies  Current Outpatient Prescriptions  Medication Sig Dispense Refill  . aspirin 81 MG chewable tablet Chew 1 tablet (81 mg total) by mouth daily.  30 tablet  1  . carvedilol (COREG) 6.25 MG tablet Take 1 tablet (6.25 mg total) by mouth 2 (two) times daily with a meal.  60 tablet  11  . clotrimazole (LOTRIMIN) 1 % cream Apply 1 application topically 2 (two) times daily.  30 g  0  . Cyanocobalamin (VITAMIN B 12 PO) Take by mouth.      . furosemide (LASIX) 80 MG tablet Take 1 tablet (80 mg total) by mouth daily.  90 tablet  3  . gabapentin (NEURONTIN) 300 MG capsule Take 1 capsule (300 mg total) by mouth 3 (three) times daily.  90 capsule  5  . HYDROcodone-acetaminophen (NORCO) 7.5-325 MG per tablet Take 1 tablet by mouth every 6 (six) hours as needed.  45 tablet  0  . lisinopril (PRINIVIL,ZESTRIL) 10 MG tablet Take 1 tablet (10 mg total) by mouth daily.  90 tablet  3  . pantoprazole (PROTONIX) 40 MG tablet Take 1 tablet 30 minutes before meal (lunch/dinner)  90 tablet  2  . pravastatin (PRAVACHOL) 40 MG tablet Take 1 tablet (40 mg total) by mouth at bedtime.  30 tablet  11  . warfarin (COUMADIN) 10 MG tablet Take 1/2 tablet daily.  30 tablet  2   No current facility-administered medications for this visit.    History   Social History  . Marital Status: Single    Spouse Name: N/A    Number of Children: N/A  .  Years of Education: N/A   Occupational History  . Not on file.   Social History Main Topics  . Smoking status: Former Research scientist (life sciences)  . Smokeless tobacco: Not on file     Comment: qoit 12 years ago  . Alcohol Use: 3.6 oz/week    0 Drinks containing 0.5 oz of alcohol, 6 Cans of beer per week     Comment: 6 pack of beer  . Drug Use: No     Comment: smoked joint 2 weeks ago  . Sexual Activity: Not on file   Other Topics Concern  . Not on file   Social History Narrative   Financial assistance approved for 100% discount at [facility] and has [facility] card per Bonna Gains   12/25/2009    Family History  Problem Relation Age of Onset  . Diabetes Father   . Diabetes Sister   . Diabetes Mother   . Deep vein thrombosis Sister   . Ovarian cancer Mother   . Bone cancer Maternal Grandmother     Past Medical History  Diagnosis Date  . Shoulder pain   . Pulmonary embolus 01/16/10    on Coumadin indefinitely  . Abdominal hernia   . Hypertension   . Overweight(278.02)   .  Lung nodule 12/2009    Very small, left upper lobe by CT June, 2011, one-year followup was stable  . Shortness of breath   . Warfarin anticoagulation   . Ejection fraction     65-70%, vigorous function, echo, March, 2011  . CHF (congestive heart failure)   . OSA (obstructive sleep apnea)     CPAP  . CAD (coronary artery disease)     Perioperative non-STEMI March, 2014  //   cardiac catheterization at time of non-STEMI, March, 2014, minor nonobstructive coronary disease, vigorous LV function, possibly vasospastic event versus stress cardiomyopathy. No further workup of coronary disease  . Ejection fraction     Vigorous function at time of catheterization October 06, 2012,    Past Surgical History  Procedure Laterality Date  . Abdominal hysterectomy  07/2002    laparoscopic assisted vaginal hysterectomy for menorrhagia, dysmenorrhea, anemia, fibroids  . Knee arthroscopy Left   . Incisional hernia repair N/A 09/27/2012     Procedure: LAPAROSCOPIC INCISIONAL HERNIA possible open;  Surgeon: Adin Hector, MD;  Location: [facility];  Service: General;  Laterality: N/A;  . Insertion of mesh N/A 09/27/2012    Procedure: INSERTION OF MESH;  Surgeon: Adin Hector, MD;  Location: [facility];  Service: General;  Laterality: N/A;    Patient Active Problem List   Diagnosis Date Noted  . Chest pain 02/23/2014  . Plantar fasciitis of right foot 02/07/2014  . Skin lesion of breast 01/19/2014  . Carpal tunnel syndrome 12/05/2013  . Left foot pain 11/03/2013  . Pain of left calf 11/03/2013  . Preventative health care 05/12/2013  . Insomnia 04/29/2013  . CAD (coronary artery disease)   . CHF NYHA class II (symptoms with moderately strenuous activities) 10/11/2012  . Ventral hernia with obstruction 09/27/2012  . Other and unspecified hyperlipidemia 08/31/2012  . Obesity 04/12/2012  . Ejection fraction   . Popliteal DVT (deep venous thrombosis) 03/20/2011  . Long-term (current) use of anticoagulants 08/09/2010  . Supraspinatus tendon tear 02/11/2010  . Pulmonary embolus 01/16/2010  . Lung nodule 12/19/2009  . OBSTRUCTIVE SLEEP APNEA 09/19/2009  . Essential hypertension, benign 06/04/2009    ROS   Patient denies fever, chills, headache, sweats, rash, change in vision, change in hearing, cough, nausea vomiting, urinary symptoms. All other systems are reviewed and are negative.  PHYSICAL EXAM  Patient is significantly overweight. She is oriented to person time and place. Affect is normal. Head is atraumatic. Lungs are clear. Respiratory effort is nonlabored. Cardiac exam reveals S1 and S2. Abdomen is soft. There is no peripheral edema.  Filed Vitals:   04/28/14 1402  BP: 134/70  Pulse: 74  Height: 5\' 7"  (1.702 m)  Weight: 313 lb 1.9 oz (142.03 kg)  SpO2: 97%     ASSESSMENT & PLAN

## 2014-04-28 NOTE — Assessment & Plan Note (Signed)
She recently had some vague upper chest discomfort. This does not sound cardiac in origin. No further workup.

## 2014-04-28 NOTE — Assessment & Plan Note (Signed)
Catheterization in the past has shown minor nonspecific ST changes. His thought that she may have had an episode of vasospasm or stress cardiomyopathy with rapid recovery. No further workup needed at this time.

## 2014-04-28 NOTE — Patient Instructions (Signed)
**Note De-Identified Anish Vana Obfuscation** Your physician recommends that you continue on your current medications as directed. Please refer to the Current Medication list given to you today.  Your physician wants you to follow-up in: 1 year. You will receive a reminder letter in the mail two months in advance. If you don't receive a letter, please call our office to schedule the follow-up appointment.  Please talk with your primary care physician concerning question plantar fasciitis/bone spurs.

## 2014-05-01 ENCOUNTER — Ambulatory Visit (INDEPENDENT_AMBULATORY_CARE_PROVIDER_SITE_OTHER): Payer: Medicare Other | Admitting: Pharmacist

## 2014-05-01 DIAGNOSIS — Z7901 Long term (current) use of anticoagulants: Secondary | ICD-10-CM

## 2014-05-01 LAB — POCT INR: INR: 3

## 2014-05-01 NOTE — Progress Notes (Signed)
Anti-Coagulation Progress Note  Breanna Wilkins is a 53 y.o. female who is currently on an anti-coagulation regimen.    RECENT RESULTS: Recent results are below, the most recent result is correlated with a dose of 30 mg. per week: Lab Results  Component Value Date   INR 3.0 05/01/2014   INR 2.70 04/03/2014   INR 4.20 03/06/2014    ANTI-COAG DOSE: Anticoagulation Dose Instructions as of 05/01/2014     [name] Tue Wed Thu Fri Sat   New Dose 5 mg 0 mg 5 mg 5 mg 5 mg 5 mg 5 mg       ANTICOAG SUMMARY: Anticoagulation Episode Summary   Current INR goal 2.0-3.0   Next INR check 05/29/2014   INR from last check 3.0 (05/01/2014)   Weekly max dose    Target end date    [facility]   Preferred lab    Send INR reminders to       Comments         ANTICOAG TODAY: Anticoagulation Summary as of 05/01/2014   INR goal 2.0-3.0   Selected INR 3.0 (05/01/2014)   Next INR check 05/29/2014   Target end date     Anticoagulation Episode Summary   [facility] check [facility]   Preferred lab    Send INR reminders to    Comments       PATIENT INSTRUCTIONS: Patient Instructions  Patient instructed to take medications as defined in the Anti-coagulation Track section of this encounter.  Patient instructed to follow her previously scheduled plan to omit today's dose.  Patient verbalized understanding of these instructions.        FOLLOW-UP Return in 4 weeks (on 05/29/2014) for follow-up INR at 2pm.  Elicia Lamp, PharmD Clinical Pharmacy Resident

## 2014-05-01 NOTE — Patient Instructions (Signed)
Patient instructed to take medications as defined in the Anti-coagulation Track section of this encounter.  Patient instructed to follow her previously scheduled plan to omit today's dose.  Patient verbalized understanding of these instructions.

## 2014-05-04 NOTE — Progress Notes (Signed)
Patient on coumadin for recurrent VTE.  INR 3.0.  I have reviewed the anticoagulation note.

## 2014-05-09 ENCOUNTER — Other Ambulatory Visit: Payer: Self-pay | Admitting: *Deleted

## 2014-05-09 MED ORDER — LISINOPRIL 10 MG PO TABS: 10.0000 mg | ORAL_TABLET | Freq: Every day | ORAL | Status: DC

## 2014-05-29 ENCOUNTER — Ambulatory Visit (INDEPENDENT_AMBULATORY_CARE_PROVIDER_SITE_OTHER): Payer: Medicare Other | Admitting: Internal Medicine

## 2014-05-29 ENCOUNTER — Encounter: Payer: Self-pay | Admitting: Internal Medicine

## 2014-05-29 ENCOUNTER — Ambulatory Visit (INDEPENDENT_AMBULATORY_CARE_PROVIDER_SITE_OTHER): Payer: Medicare Other | Admitting: Pharmacist

## 2014-05-29 VITALS — BP 140/67 | HR 71 | Temp 97.5°F | Wt 321.0 lb

## 2014-05-29 DIAGNOSIS — K219 Gastro-esophageal reflux disease without esophagitis: Secondary | ICD-10-CM

## 2014-05-29 DIAGNOSIS — Z0000 Encounter for general adult medical examination without abnormal findings: Secondary | ICD-10-CM

## 2014-05-29 DIAGNOSIS — M722 Plantar fascial fibromatosis: Secondary | ICD-10-CM

## 2014-05-29 DIAGNOSIS — Z7901 Long term (current) use of anticoagulants: Secondary | ICD-10-CM

## 2014-05-29 DIAGNOSIS — Z23 Encounter for immunization: Secondary | ICD-10-CM

## 2014-05-29 DIAGNOSIS — I10 Essential (primary) hypertension: Secondary | ICD-10-CM

## 2014-05-29 LAB — POCT INR: INR: 3.8

## 2014-05-29 MED ORDER — WARFARIN SODIUM 10 MG PO TABS: ORAL_TABLET | ORAL | Status: DC

## 2014-05-29 MED ORDER — IBUPROFEN 600 MG PO TABS: 600.0000 mg | ORAL_TABLET | Freq: Three times a day (TID) | ORAL | Status: DC | PRN

## 2014-05-29 MED ORDER — PANTOPRAZOLE SODIUM 40 MG PO TBEC: DELAYED_RELEASE_TABLET | ORAL | Status: DC

## 2014-05-29 NOTE — Patient Instructions (Signed)
Patient instructed to take medications as defined in the Anti-coagulation Track section of this encounter.  Patient instructed to take today's dose.  Patient verbalized understanding of these instructions.    

## 2014-05-29 NOTE — Assessment & Plan Note (Signed)
Given Tdap, declined flu shot

## 2014-05-29 NOTE — Assessment & Plan Note (Signed)
BP Readings from Last 3 Encounters:  04/28/14 134/70  02/23/14 121/74  02/07/14 120/77    Lab Results  Component Value Date   NA 138 02/23/2014   K 4.1 02/23/2014   CREATININE 0.83 02/23/2014    Assessment: Blood pressure control: controlled Progress toward BP goal:  at goal Comments: none  Plan: Medications:  continue current medications (Lasix 80 mg qd, Coreg 6.25 mg bid, Lisionpril 10 mg qd), continue statin Other plans: as sch with PCP

## 2014-05-29 NOTE — Patient Instructions (Addendum)
General Instructions: Will try stretching, rest, ice, avoid flat shoes use heel inserts/arch supports, NSAIDS (Ibuprofen 600 mg every 8 hours as needed x 2-3 weeks with strict following of INR with Dr. Elie Confer) Will refer back to Sports medicine to possibly do steroid injection if not improved  Given info and stretching info  Thank you for bringing your medicines today. This helps [place] keep you safe from mistakes.   Progress Toward Treatment Goals:  Treatment Goal 05/29/2014  Blood pressure at goal    Self Care Goals & Plans:  Self Care Goal 05/29/2014  Manage my medications take my medicines as prescribed; bring my medications to every visit; refill my medications on time; follow the sick day instructions if I am sick  Monitor my health keep track of my blood glucose  Eat healthy foods -  Be physically active -  Meeting treatment goals maintain the current self-care plan    No flowsheet data found.   Care Management & Community Referrals:  Referral 05/29/2014  Referrals made for care management support none needed  Referrals made to community resources none          Treatment Goals:  Goals (1 Years of Data) as of 05/29/14          10/17/13 12/07/12     Result Component   . LDL CALC < 100  102 124      Progress Toward Treatment Goals:  Treatment Goal 05/29/2014  Blood pressure at goal    Self Care Goals & Plans:  Self Care Goal 05/29/2014  Manage my medications take my medicines as prescribed; bring my medications to every visit; refill my medications on time; follow the sick day instructions if I am sick  Monitor my health keep track of my blood glucose  Eat healthy foods -  Be physically active -  Meeting treatment goals maintain the current self-care plan    No flowsheet data found.   Care Management & Community Referrals:  Referral 05/29/2014  Referrals made for care management support none needed  Referrals made to community resources none

## 2014-05-29 NOTE — Progress Notes (Signed)
Anti-Coagulation Progress Note  Breanna Wilkins is a 53 y.o. female who is currently on an anti-coagulation regimen.    RECENT RESULTS: Recent results are below, the most recent result is correlated with a dose of 30 mg. per week: Lab Results  Component Value Date   INR 3.0 05/01/2014   INR 2.70 04/03/2014   INR 4.20 03/06/2014    ANTI-COAG DOSE: Anticoagulation Dose Instructions as of 05/29/2014      [name] Tue Wed Thu Fri Sat   New Dose 5 mg 0 mg 0 mg 5 mg 5 mg 5 mg 5 mg       ANTICOAG SUMMARY: Anticoagulation Episode Summary    Current INR goal 2.0-3.0   Next INR check 06/19/2014   INR from last check 3.0 (05/01/2014)   Weekly max dose    Target end date    [facility] location [facility]   Preferred lab    Send INR reminders to       Comments         ANTICOAG TODAY: Anticoagulation Summary as of 05/29/2014    INR goal 2.0-3.0   Selected INR 3.0 (05/01/2014)   Next INR check 06/19/2014   Target end date     Anticoagulation Episode Summary    [facility] check location [facility]   Preferred lab    Send INR reminders to    Comments       PATIENT INSTRUCTIONS: Patient Instructions  Patient instructed to take medications as defined in the Anti-coagulation Track section of this encounter.  Patient instructed to take today's dose.  Patient verbalized understanding of these instructions.       FOLLOW-UP Return in 3 weeks (on 06/19/2014) for Follow up INR at [place], III Pharm.D., CACP

## 2014-05-29 NOTE — Assessment & Plan Note (Signed)
Rx refill protonix today

## 2014-05-29 NOTE — Progress Notes (Signed)
   Subjective:    Patient ID: Breanna Wilkins, female    DOB: 1960/08/09, 53 y.o.   MRN: [id number]  HPI Comments: 53 y.o PMH prediabetes, PE/DVT (on Coumadin INR 05/01/14 was 3.0 with f/u appt with Dr. Elie Confer 05/29/14), OSA, lung nodule, HTN, insomnia, CAD, CHF  She presents for f/u for 1.  Right heel main noted x  2-3 months Xray 01/2014 concerned with plantar fascititis. Pain is daily constant, worse with walking and in the early am.  She cant exercise due to pain and it hurts to stand for long time on heel.  First thing in the am she has to move her foot b/f she walks and walks with a cane now 2/2 pain. Pain is >10/10.  She has been taking Hydrocodone 7.5-325 for pain (on pain contract) but is having to take 1/2 to 2 pills at a time.  She just picked up her last of the 3 month supply of her narcotic and wants to talk to her PCP about frequency.    2. She needs Rx refill of Protonix today  3. HTN-BP is 140/67-she has missed her lasix for 2 days b/c she stated she was traveling and did not want to have to urinate.    HM-Tdap will get today; declines flu shot      Review of Systems  Constitutional: Positive for unexpected weight change.       Weight gain due to inability to exercise  Respiratory: Negative for shortness of breath.   Cardiovascular: Positive for leg swelling. Negative for chest pain.  Gastrointestinal: Negative for abdominal pain.       Objective:   Physical Exam  Constitutional: She is oriented to person, place, and time. Vital signs are normal. She appears well-developed and well-nourished. She is cooperative. No distress.  HENT:  Head: Normocephalic and atraumatic.  Mouth/Throat: No oropharyngeal exudate.  Eyes: Conjunctivae are normal. Right eye exhibits no discharge. Left eye exhibits no discharge. No scleral icterus.  Cardiovascular: Normal rate, regular rhythm, S1 normal, S2 normal and normal heart sounds.   No murmur heard. 2+ lower ext edema b/l     Pulmonary/Chest: Effort normal and breath sounds normal. No respiratory distress. She has no wheezes.  Abdominal: Soft. Bowel sounds are normal. There is no tenderness.  obese  Musculoskeletal:       Feet:  Neurological: She is alert and oriented to person, place, and time. Gait normal.  Skin: Skin is warm, dry and intact. No rash noted. She is not diaphoretic.  Psychiatric: She has a normal mood and affect. Her speech is normal and behavior is normal. Judgment and thought content normal. Cognition and memory are normal.  Nursing note and vitals reviewed.         Assessment & Plan:  F/u with Sports Medicine and [facility] clinic in 1 month prn

## 2014-05-29 NOTE — Assessment & Plan Note (Signed)
Will try stretching, rest, ice, avoid flat shoes use heel inserts/arch supports, NSAIDS x 2-3 weeks with strict following of INR Will refer back to Sports medicine to possibly do steroid injection if not improved  Given info and stretching info

## 2014-05-30 NOTE — Progress Notes (Signed)
INTERNAL MEDICINE TEACHING ATTENDING ADDENDUM - Massiah Longanecker, MD: I reviewed and discussed at the time of visit with the resident Dr. McLean, the patient's medical history, physical examination, diagnosis and results of pertinent tests and treatment and I agree with the patient's care as documented.  

## 2014-05-30 NOTE — Progress Notes (Signed)
INTERNAL MEDICINE TEACHING ATTENDING ADDENDUM - Aldine Contes M.D  Duration- indefinite, Indication- DVT/PE, INR- supratherapeutic. Agree with Dr. Gladstone Pih recommendations as outlined in his note.

## 2014-06-14 ENCOUNTER — Other Ambulatory Visit: Payer: Self-pay | Admitting: *Deleted

## 2014-06-14 MED ORDER — HYDROCODONE-ACETAMINOPHEN 7.5-325 MG PO TABS: 1.0000 | ORAL_TABLET | Freq: Four times a day (QID) | ORAL | Status: DC | PRN

## 2014-06-14 NOTE — Telephone Encounter (Signed)
Pt aware Rx are ready. 

## 2014-06-19 ENCOUNTER — Ambulatory Visit: Payer: Medicare Other

## 2014-06-26 ENCOUNTER — Encounter: Payer: Self-pay | Admitting: Sports Medicine

## 2014-06-26 ENCOUNTER — Ambulatory Visit (INDEPENDENT_AMBULATORY_CARE_PROVIDER_SITE_OTHER): Payer: Medicare Other | Admitting: Sports Medicine

## 2014-06-26 VITALS — BP 132/98 | Ht 67.0 in | Wt 300.0 lb

## 2014-06-26 DIAGNOSIS — M722 Plantar fascial fibromatosis: Secondary | ICD-10-CM

## 2014-06-26 NOTE — Progress Notes (Signed)
   Subjective:    Patient ID: Breanna Wilkins, female    DOB: 02/11/1961, 53 y.o.   MRN: [id number]  HPI  Right foot pain: Patient presents to sports medicine clinic today for evaluation of right heel pain. She states that her heel pain started approximately 4-5 months ago, but has been, increasingly worse over the last month. She reports that is worse in the morning when trying to put weight on it, and with long walks. She endorses having more support her tennis shoes, and more pain with flat nonsupportive shoes. She has tried soaking it in Epsom salts and was prescribed gabapentin, which she states made it worse. She she states she is aware her weight may be a factor, as that she feels she is at least 100 pounds overweight. She has a history of a bone spur on the left heel. She has not tried any other medications at this time.  Former Smoker  Past Medical History  Diagnosis Date  . Shoulder pain   . Pulmonary embolus 01/16/10    on Coumadin indefinitely  . Abdominal hernia   . Hypertension   . Overweight(278.02)   . Lung nodule 12/2009    Very small, left upper lobe by CT June, 2011, one-year followup was stable  . Shortness of breath   . Warfarin anticoagulation   . Ejection fraction     65-70%, vigorous function, echo, March, 2011  . CHF (congestive heart failure)   . OSA (obstructive sleep apnea)     CPAP  . CAD (coronary artery disease)     Perioperative non-STEMI March, 2014  //   cardiac catheterization at time of non-STEMI, March, 2014, minor nonobstructive coronary disease, vigorous LV function, possibly vasospastic event versus stress cardiomyopathy. No further workup of coronary disease  . Ejection fraction     Vigorous function at time of catheterization October 06, 2012,   No Known Allergies Review of Systems Per HPI    Objective:   Physical Exam BP 132/98 mmHg  Ht 5\' 7"  (1.702 m)  Wt 300 lb (136.079 kg)  BMI 46.98 kg/m2 Gen: Very pleasant, African-American  female, no acute distress, nontoxic in appearance morbidly obese, well-developed, well-nourished alert, oriented times 3. Right foot: No erythema. Small amount of swelling plantar aspect lateral foot. Tenderness to palpation proximal portion of planar fascia. Mild pain with full extension. Normal Range of motion. Mild pes planus. Neurovascularly intact distally.  MSK ultrasound of the right foot was performed. Limited images of the plantar fascia were obtained. Plantar fascia was noted to be thickened at greater 0.63cm. no significant spurring was seen. There are diffuse hypoechoic changes throughout the proximal plantar fascia. Findings are consistent with plantar fasciitis       Assessment & Plan:  Breanna Wilkins is a 53 y.o. female with right planar fasciitis. - Urged patient to ice submersion heel for 10 minutes daily. - Started on PF stretches daily. - Patient fitted with green sports insoles and scaphoid pads. - Follow-up in one month

## 2014-06-29 ENCOUNTER — Encounter (HOSPITAL_COMMUNITY): Payer: Self-pay | Admitting: Cardiovascular Disease

## 2014-07-24 ENCOUNTER — Ambulatory Visit (INDEPENDENT_AMBULATORY_CARE_PROVIDER_SITE_OTHER): Payer: Medicare Other | Admitting: Pharmacist

## 2014-07-24 ENCOUNTER — Ambulatory Visit: Payer: Medicare Other | Admitting: Sports Medicine

## 2014-07-24 DIAGNOSIS — Z7901 Long term (current) use of anticoagulants: Secondary | ICD-10-CM | POA: Diagnosis not present

## 2014-07-24 LAB — POCT INR: INR: 2.2

## 2014-07-24 NOTE — Progress Notes (Signed)
Anti-Coagulation Progress Note  Breanna Wilkins is a 54 y.o. female who is currently on an anti-coagulation regimen.    RECENT RESULTS: Recent results are below, the most recent result is correlated with a dose of 30 mg. per week: (Note, total weekly dose was supposed to be 25 mg per week but patient did not follow directions as was prescribed.) Lab Results  Component Value Date   INR 2.20 07/24/2014   INR 3.80 05/29/2014   INR 3.0 05/01/2014    ANTI-COAG DOSE: Anticoagulation Dose Instructions as of 07/24/2014      [name] Tue Wed Thu Fri Sat   New Dose 5 mg 5 mg 5 mg 5 mg 5 mg 5 mg 5 mg       ANTICOAG SUMMARY: Anticoagulation Episode Summary    Current INR goal 2.0-3.0   Next INR check 08/14/2014   INR from last check 2.20 (07/24/2014)   Weekly max dose    Target end date    [facility]   Preferred lab    Send INR reminders to       Comments         ANTICOAG TODAY: Anticoagulation Summary as of 07/24/2014    INR goal 2.0-3.0   Selected INR 2.20 (07/24/2014)   Next INR check 08/14/2014   Target end date     Anticoagulation Episode Summary    [facility]   Preferred lab    Send INR reminders to    Comments       PATIENT INSTRUCTIONS: Patient Instructions  Patient instructed to take medications as defined in the Anti-coagulation Track section of this encounter.  Patient instructed to take today's dose.  Patient verbalized understanding of these instructions.        FOLLOW-UP Return in 3 weeks (on 08/14/2014) for Follow-up INR @ 2:30 pm.   Theron Arista, PharmD Clinical Pharmacist - Resident Pager: [phone] 1/4/20162:53 PM

## 2014-07-24 NOTE — Patient Instructions (Signed)
Patient instructed to take medications as defined in the Anti-coagulation Track section of this encounter.  Patient instructed to take today's dose.  Patient verbalized understanding of these instructions.    

## 2014-07-31 ENCOUNTER — Ambulatory Visit (INDEPENDENT_AMBULATORY_CARE_PROVIDER_SITE_OTHER): Payer: Medicare Other | Admitting: Internal Medicine

## 2014-07-31 VITALS — BP 126/74 | HR 76 | Temp 97.4°F | Ht 67.0 in | Wt 310.1 lb

## 2014-07-31 DIAGNOSIS — S46819D Strain of other muscles, fascia and tendons at shoulder and upper arm level, unspecified arm, subsequent encounter: Secondary | ICD-10-CM

## 2014-07-31 DIAGNOSIS — N649 Disorder of breast, unspecified: Secondary | ICD-10-CM

## 2014-07-31 DIAGNOSIS — Z7901 Long term (current) use of anticoagulants: Secondary | ICD-10-CM

## 2014-07-31 DIAGNOSIS — L988 Other specified disorders of the skin and subcutaneous tissue: Secondary | ICD-10-CM

## 2014-07-31 DIAGNOSIS — J0100 Acute maxillary sinusitis, unspecified: Secondary | ICD-10-CM

## 2014-07-31 DIAGNOSIS — IMO0001 Reserved for inherently not codable concepts without codable children: Secondary | ICD-10-CM

## 2014-07-31 DIAGNOSIS — M75100 Unspecified rotator cuff tear or rupture of unspecified shoulder, not specified as traumatic: Secondary | ICD-10-CM

## 2014-07-31 DIAGNOSIS — Z87891 Personal history of nicotine dependence: Secondary | ICD-10-CM

## 2014-07-31 DIAGNOSIS — I2510 Atherosclerotic heart disease of native coronary artery without angina pectoris: Secondary | ICD-10-CM | POA: Diagnosis not present

## 2014-07-31 DIAGNOSIS — I10 Essential (primary) hypertension: Secondary | ICD-10-CM

## 2014-07-31 LAB — BASIC METABOLIC PANEL WITH GFR
BUN: 14 mg/dL (ref 6–23)
CALCIUM: 9.4 mg/dL (ref 8.4–10.5)
CO2: 27 mEq/L (ref 19–32)
Chloride: 100 mEq/L (ref 96–112)
Creat: 0.86 mg/dL (ref 0.50–1.10)
GFR, EST AFRICAN AMERICAN: 88 mL/min
GFR, EST NON AFRICAN AMERICAN: 76 mL/min
Glucose, Bld: 93 mg/dL (ref 70–99)
Potassium: 4.2 mEq/L (ref 3.5–5.3)
Sodium: 140 mEq/L (ref 135–145)

## 2014-07-31 MED ORDER — HYDROCODONE-ACETAMINOPHEN 7.5-325 MG PO TABS: 1.0000 | ORAL_TABLET | Freq: Four times a day (QID) | ORAL | Status: DC | PRN

## 2014-07-31 MED ORDER — DOXYCYCLINE HYCLATE 100 MG PO TABS: 100.0000 mg | ORAL_TABLET | Freq: Two times a day (BID) | ORAL | Status: AC

## 2014-07-31 MED ORDER — ROSUVASTATIN CALCIUM 5 MG PO TABS
5.0000 mg | ORAL_TABLET | Freq: Every day | ORAL | Status: DC
Start: 2014-07-31 — End: 2014-11-20

## 2014-07-31 NOTE — Patient Instructions (Signed)
General Instructions: I will call you about your blood work.  Please start taking Crestor 5mg  for cholesterol  Please bring your medicines with you each time you come to clinic.  Medicines may include prescription medications, over-the-counter medications, herbal remedies, eye drops, vitamins, or other pills.   Progress Toward Treatment Goals:  Treatment Goal 05/29/2014  Blood pressure at goal    Self Care Goals & Plans:  Self Care Goal 07/31/2014  Manage my medications take my medicines as prescribed; bring my medications to every visit; refill my medications on time; follow the sick day instructions if I am sick  Monitor my health -  Eat healthy foods eat more vegetables; eat fruit for snacks and desserts; eat baked foods instead of fried foods; eat smaller portions  Be physically active find an activity I enjoy  Meeting treatment goals -    No flowsheet data found.   Care Management & Community Referrals:  Referral 05/29/2014  Referrals made for care management support none needed  Referrals made to community resources none

## 2014-07-31 NOTE — Progress Notes (Signed)
Internal Medicine Clinic Attending  Case discussed with Dr. Hoffman soon after the resident saw the patient.  We reviewed the resident's history and exam and pertinent patient test results.  I agree with the assessment, diagnosis, and plan of care documented in the resident's note. 

## 2014-07-31 NOTE — Progress Notes (Signed)
[facility] Subjective:   Patient ID: Breanna Wilkins female   DOB: Oct 24, 1960 54 y.o.   MRN: [id number]  HPI: [name] is a 54 y.o. female with a PMH CHF, OSA, PE, CAD, HTN, who presents for an acute visit. She reports that for the past week she has had a lot of nasal congestion and discharge with green sputum.  She also notes facial pain, a mild cough.  She reports she may have had a fever but   She reports she has been taking her lasix for CHF but has stopped taking supplemental potassium.  For this skin lesion on her breast she reports that she did see the dermatologist and he gave her a different cream (she is unsure of name) and did a biopsy.  She reports that resolved the lesion but she feels it may be coming back.  Her plantar fasciitis is improved.  Past Medical History  Diagnosis Date  . Shoulder pain   . Pulmonary embolus 01/16/10    on Coumadin indefinitely  . Abdominal hernia   . Hypertension   . Overweight(278.02)   . Lung nodule 12/2009    Very small, left upper lobe by CT June, 2011, one-year followup was stable  . Shortness of breath   . Warfarin anticoagulation   . Ejection fraction     65-70%, vigorous function, echo, March, 2011  . CHF (congestive heart failure)   . OSA (obstructive sleep apnea)     CPAP  . CAD (coronary artery disease)     Perioperative non-STEMI March, 2014  //   cardiac catheterization at time of non-STEMI, March, 2014, minor nonobstructive coronary disease, vigorous LV function, possibly vasospastic event versus stress cardiomyopathy. No further workup of coronary disease  . Ejection fraction     Vigorous function at time of catheterization October 06, 2012,   Current Outpatient Prescriptions  Medication Sig Dispense Refill  . aspirin 81 MG chewable tablet Chew 1 tablet (81 mg total) by mouth daily. 30 tablet 1  . carvedilol (COREG) 6.25 MG tablet Take 1 tablet (6.25 mg total) by mouth 2 (two) times daily  with a meal. 60 tablet 11  . furosemide (LASIX) 80 MG tablet Take 1 tablet (80 mg total) by mouth daily. 90 tablet 3  . HYDROcodone-acetaminophen (NORCO) 7.5-325 MG per tablet Take 1 tablet by mouth every 6 (six) hours as needed. 70 tablet 0  . lisinopril (PRINIVIL,ZESTRIL) 10 MG tablet Take 1 tablet (10 mg total) by mouth daily. 90 tablet 3  . pantoprazole (PROTONIX) 40 MG tablet Take 1 tablet 30 minutes before meal (lunch/dinner) 90 tablet 2  . warfarin (COUMADIN) 10 MG tablet Take 1/2 tablet daily except on Mondays/Tuesdays--do NOT take warfarin on those days. 30 tablet 2  . BOOSTRIX 5-2.5-18.5 injection     . Cyanocobalamin (VITAMIN B 12 PO) Take by mouth.    . doxycycline (VIBRA-TABS) 100 MG tablet Take 1 tablet (100 mg total) by mouth 2 (two) times daily. 14 tablet 0  . rosuvastatin (CRESTOR) 5 MG tablet Take 1 tablet (5 mg total) by mouth at bedtime. 30 tablet 11   No current facility-administered medications for this visit.   Family History  Problem Relation Age of Onset  . Diabetes Father   . Diabetes Sister   . Diabetes Mother   . Deep vein thrombosis Sister   . Ovarian cancer Mother   . Bone cancer Maternal Grandmother    History   Social History  .  Marital Status: Single    Spouse Name: N/A    Number of Children: N/A  . Years of Education: N/A   Social History Main Topics  . Smoking status: Former Research scientist (life sciences)  . Smokeless tobacco: Not on file     Comment: qoit 12 years ago  . Alcohol Use: 3.6 oz/week    0 Not specified, 6 Cans of beer per week     Comment: 6 pack of beer  . Drug Use: No     Comment: smoked joint 2 weeks ago  . Sexual Activity: Not on file   Other Topics Concern  . Not on file   Social History Narrative   Financial assistance approved for 100% discount at [facility] and has [facility] card per Bonna Gains   12/25/2009   Review of Systems: ROS   Per HPI Objective:  Physical Exam: Filed Vitals:   07/31/14 1356  BP: 126/74  Pulse: 76  Temp: 97.4 F  (36.3 C)  TempSrc: Oral  Height: 5\' 7"  (1.702 m)  Weight: 310 lb 1.6 oz (140.66 kg)  SpO2: 97%   Physical Exam  Constitutional: She is well-developed, well-nourished, and in no distress.  HENT:  Maxillary sinus tenderness, mild errythema is posterior oropharynx  Eyes: Conjunctivae are normal.  Cardiovascular: Normal rate, regular rhythm and normal heart sounds.   Pulmonary/Chest: Effort normal and breath sounds normal. She has no rales.    Abdominal: Soft. Bowel sounds are normal. She exhibits no distension. There is no tenderness.  Musculoskeletal: She exhibits no edema.  Nursing note and vitals reviewed.    Assessment & Plan:  Case discussed with Dr. Ellwood Dense  Essential hypertension, benign BP Readings from Last 3 Encounters:  07/31/14 126/74  06/26/14 132/98  05/29/14 140/67    Lab Results  Component Value Date   NA 140 07/31/2014   K 4.2 07/31/2014   CREATININE 0.86 07/31/2014    Assessment: Blood pressure control:   controlled Progress toward BP goal:    at goal Comments:   Plan: Medications:  Lisinopril 10mg  daily, lasix 80mg  daily, coreg 6.25 BID Educational resources provided: brochure, handout, video Self management tools provided:   Other plans:     CAD (coronary artery disease) - Refill crestor   Sinusitis, acute maxillary - Will prescribe course of Abx, discussed with Dr. Elie Confer, doxycyline less likely to interfere with warfarin therapy then PCN drugs. - Doxycycline x 7 days   Supraspinatus tendon tear Patient notes that Norco has been effective for controlling her pain and allowing her to be function, and exercise (lost 11 lbs!).  However she notes she typically needs to take it at least twice a day and occasionally three times.  This has meant that she has had to sometimes break pills and conserve her medication to ensure she can have some reflief when needed. She reports she did take Norco this AM. - Check UDS for drug monitoring - Will  increase # of norco a month from 45 to 70 to allow for BID dosing with occasionally an extra dose.   Skin lesion of breast - Instructed to follow up with dermatology.     Medications Ordered Meds ordered this encounter  Medications  . DISCONTD: HYDROcodone-acetaminophen (NORCO) 7.5-325 MG per tablet    Sig: Take 1 tablet by mouth every 6 (six) hours as needed.    Dispense:  70 tablet    Refill:  0    Rx 1/3 May be filled on 07/31/14 (increase of previous dose to #70  pills/month)  . DISCONTD: HYDROcodone-acetaminophen (NORCO) 7.5-325 MG per tablet    Sig: Take 1 tablet by mouth every 6 (six) hours as needed.    Dispense:  70 tablet    Refill:  0    Rx 2/3 May be filled 30 days after printed date  . HYDROcodone-acetaminophen (NORCO) 7.5-325 MG per tablet    Sig: Take 1 tablet by mouth every 6 (six) hours as needed.    Dispense:  70 tablet    Refill:  0    Rx 3/3 May be filled 60 days after printed date  . rosuvastatin (CRESTOR) 5 MG tablet    Sig: Take 1 tablet (5 mg total) by mouth at bedtime.    Dispense:  30 tablet    Refill:  11    D/c Pravastatin  . doxycycline (VIBRA-TABS) 100 MG tablet    Sig: Take 1 tablet (100 mg total) by mouth 2 (two) times daily.    Dispense:  14 tablet    Refill:  0   Other Orders Orders Placed This Encounter  Procedures  . Prescription Abuse Monitoring, 15 Panel  . BMP with Estimated GFR (CPT-80048)  . Opiates/Opioids (LC/MS-MS)

## 2014-08-01 LAB — PRESCRIPTION ABUSE MONITORING 15P, URINE
Amphetamine/Meth: NEGATIVE ng/mL
Barbiturate Screen, Urine: NEGATIVE ng/mL
Benzodiazepine Screen, Urine: NEGATIVE ng/mL
Buprenorphine, Urine: NEGATIVE ng/mL
CANNABINOID SCRN UR: NEGATIVE ng/mL
Carisoprodol, Urine: NEGATIVE ng/mL
Cocaine Metabolites: NEGATIVE ng/mL
Creatinine, Urine: 241.71 mg/dL (ref >20.0)
Fentanyl, Ur: NEGATIVE ng/mL
Meperidine, Ur: NEGATIVE ng/mL
Methadone Screen, Urine: NEGATIVE ng/mL
Oxycodone Screen, Ur: NEGATIVE ng/mL
Propoxyphene: NEGATIVE ng/mL
TRAMADOL UR: NEGATIVE ng/mL
Zolpidem, Urine: NEGATIVE ng/mL

## 2014-08-03 NOTE — Assessment & Plan Note (Signed)
-   Will prescribe course of Abx, discussed with Dr. Elie Confer, doxycyline less likely to interfere with warfarin therapy then PCN drugs. - Doxycycline x 7 days

## 2014-08-03 NOTE — Assessment & Plan Note (Signed)
BP Readings from Last 3 Encounters:  07/31/14 126/74  06/26/14 132/98  05/29/14 140/67    Lab Results  Component Value Date   NA 140 07/31/2014   K 4.2 07/31/2014   CREATININE 0.86 07/31/2014    Assessment: Blood pressure control:   controlled Progress toward BP goal:    at goal Comments:   Plan: Medications:  Lisinopril 10mg  daily, lasix 80mg  daily, coreg 6.25 BID Educational resources provided: brochure, handout, video Self management tools provided:   Other plans:

## 2014-08-03 NOTE — Assessment & Plan Note (Signed)
-   Instructed to follow up with dermatology.

## 2014-08-03 NOTE — Assessment & Plan Note (Signed)
-   Refill crestor

## 2014-08-03 NOTE — Assessment & Plan Note (Signed)
Patient notes that Norco has been effective for controlling her pain and allowing her to be function, and exercise (lost 11 lbs!).  However she notes she typically needs to take it at least twice a day and occasionally three times.  This has meant that she has had to sometimes break pills and conserve her medication to ensure she can have some reflief when needed. She reports she did take Norco this AM. - Check UDS for drug monitoring - Will increase # of norco a month from 45 to 70 to allow for BID dosing with occasionally an extra dose.

## 2014-08-05 LAB — OPIATES/OPIOIDS (LC/MS-MS)
Codeine Urine: NEGATIVE ng/mL (ref <50)
HYDROMORPHONE: 961 ng/mL — AB (ref <50)
Hydrocodone: 581 ng/mL — ABNORMAL HIGH (ref <50)
Morphine Urine: NEGATIVE ng/mL (ref <50)
NOROXYCODONE, UR: NEGATIVE ng/mL (ref <50)
Norhydrocodone, Ur: 911 ng/mL — ABNORMAL HIGH (ref <50)
OXYCODONE, UR: NEGATIVE ng/mL (ref <50)
Oxymorphone: NEGATIVE ng/mL (ref <50)

## 2014-08-14 ENCOUNTER — Ambulatory Visit: Payer: Medicare Other

## 2014-08-21 ENCOUNTER — Ambulatory Visit (INDEPENDENT_AMBULATORY_CARE_PROVIDER_SITE_OTHER): Payer: Medicare Other | Admitting: Pharmacist

## 2014-08-21 DIAGNOSIS — Z86718 Personal history of other venous thrombosis and embolism: Secondary | ICD-10-CM

## 2014-08-21 DIAGNOSIS — Z7901 Long term (current) use of anticoagulants: Secondary | ICD-10-CM | POA: Diagnosis not present

## 2014-08-21 LAB — POCT INR: INR: 4.1

## 2014-08-21 NOTE — Patient Instructions (Signed)
Patient instructed to take medications as defined in the Anti-coagulation Track section of this encounter.  Patient instructed to OMIT today's dose.  Patient verbalized understanding of these instructions.    

## 2014-08-21 NOTE — Progress Notes (Signed)
Anti-Coagulation Progress Note  Breanna Wilkins is a 54 y.o. female who is currently on an anti-coagulation regimen.    RECENT RESULTS: Recent results are below, the most recent result is correlated with a dose of 35 mg. per week: Lab Results  Component Value Date   INR 4.10 08/21/2014   INR 2.20 07/24/2014   INR 3.80 05/29/2014    ANTI-COAG DOSE: Anticoagulation Dose Instructions as of 08/21/2014      [name] Tue Wed Thu Fri Sat   New Dose 5 mg 0 mg 5 mg 5 mg 5 mg 5 mg 5 mg       ANTICOAG SUMMARY: Anticoagulation Episode Summary    Current INR goal 2.0-3.0   Next INR check 09/11/2014   INR from last check 4.10! (08/21/2014)   Weekly max dose    Target end date    [facility]   Preferred lab    Send INR reminders to       Comments         ANTICOAG TODAY: Anticoagulation Summary as of 08/21/2014    INR goal 2.0-3.0   Selected INR 4.10! (08/21/2014)   Next INR check 09/11/2014   Target end date     Anticoagulation Episode Summary    [facility]   Preferred lab    Send INR reminders to    Comments       PATIENT INSTRUCTIONS: Patient Instructions  Patient instructed to take medications as defined in the Anti-coagulation Track section of this encounter.  Patient instructed to OMIT today's dose.  Patient verbalized understanding of these instructions.       FOLLOW-UP Return in 3 weeks (on 09/11/2014) for Follow up INR at 2:15PM.  [name], III Pharm.D., CACP

## 2014-08-22 NOTE — Progress Notes (Signed)
INTERNAL MEDICINE TEACHING ATTENDING ADDENDUM - Aldine Contes M.D  Duration- indefinite, Indication- DVT, INR- supratherapeutic. Agree with Dr. Gladstone Pih recommendations as outlined in his note.

## 2014-09-05 DIAGNOSIS — G4733 Obstructive sleep apnea (adult) (pediatric): Secondary | ICD-10-CM | POA: Diagnosis not present

## 2014-09-08 ENCOUNTER — Telehealth: Payer: Self-pay | Admitting: Pharmacist

## 2014-09-08 NOTE — Telephone Encounter (Signed)
Call to patient to confirm appt for 09/11/14 at 2:30. lmtcb

## 2014-09-11 ENCOUNTER — Ambulatory Visit (INDEPENDENT_AMBULATORY_CARE_PROVIDER_SITE_OTHER): Payer: Medicare Other | Admitting: Pharmacist

## 2014-09-11 DIAGNOSIS — Z7901 Long term (current) use of anticoagulants: Secondary | ICD-10-CM

## 2014-09-11 DIAGNOSIS — Z86718 Personal history of other venous thrombosis and embolism: Secondary | ICD-10-CM

## 2014-09-11 LAB — POCT INR: INR: 3

## 2014-09-11 NOTE — Progress Notes (Signed)
Anti-Coagulation Progress Note  Breanna Wilkins is a 54 y.o. female who is currently on an anti-coagulation regimen.    RECENT RESULTS: Recent results are below, the most recent result is correlated with a dose of 30 mg. per week: Lab Results  Component Value Date   INR 3.00 09/11/2014   INR 4.10 08/21/2014   INR 2.20 07/24/2014    ANTI-COAG DOSE: Anticoagulation Dose Instructions as of 09/11/2014      [name] Tue Wed Thu Fri Sat   New Dose 5 mg 0 mg 5 mg 5 mg 5 mg 5 mg 5 mg       ANTICOAG SUMMARY: Anticoagulation Episode Summary    Current INR goal 2.0-3.0   Next INR check 10/16/2014   INR from last check 3.00 (09/11/2014)   Weekly max dose    Target end date    [facility]   Preferred lab    Send INR reminders to       Comments         ANTICOAG TODAY: Anticoagulation Summary as of 09/11/2014    INR goal 2.0-3.0   Selected INR 3.00 (09/11/2014)   Next INR check 10/16/2014   Target end date     Anticoagulation Episode Summary    [facility]   Preferred lab    Send INR reminders to    Comments       PATIENT INSTRUCTIONS: Patient Instructions  Patient instructed to take medications as defined in the Anti-coagulation Track section of this encounter.  Patient instructed to take today's dose.  Patient verbalized understanding of these instructions.       FOLLOW-UP Return in 5 weeks (on 10/16/2014) for Follow up INR at [place], III Pharm.D., CACP

## 2014-09-11 NOTE — Patient Instructions (Signed)
Patient instructed to take medications as defined in the Anti-coagulation Track section of this encounter.  Patient instructed to take today's dose.  Patient verbalized understanding of these instructions.    

## 2014-09-12 NOTE — Progress Notes (Signed)
INTERNAL MEDICINE TEACHING ATTENDING ADDENDUM - Aldine Contes M.D  Duration- indefinite, Indication- PE, DVT, INR- therapeiutic. Agree with Dr. Gladstone Pih recommendations as outlined in his note.

## 2014-10-16 ENCOUNTER — Ambulatory Visit (INDEPENDENT_AMBULATORY_CARE_PROVIDER_SITE_OTHER): Payer: Medicare Other | Admitting: Pharmacist

## 2014-10-16 DIAGNOSIS — Z86711 Personal history of pulmonary embolism: Secondary | ICD-10-CM | POA: Diagnosis not present

## 2014-10-16 DIAGNOSIS — Z7901 Long term (current) use of anticoagulants: Secondary | ICD-10-CM

## 2014-10-16 LAB — POCT INR: INR: 1.6

## 2014-10-16 NOTE — Patient Instructions (Signed)
Patient instructed to take medications as defined in the Anti-coagulation Track section of this encounter.  Patient instructed to take today's dose.  Patient verbalized understanding of these instructions.    

## 2014-10-16 NOTE — Progress Notes (Addendum)
  Anti-Coagulation Progress Note Breanna Wilkins is a 54 y.o. female who is currently on an anti-coagulation regimen.  RECENT RESULTS: Recent results are below, the most recent result is correlated with a dose of 25 mg per week (patient on 30 mg per week, but missed last Wednesday's dose): Lab Results  Component Value Date   INR 1.6 10/16/2014   INR 3.00 09/11/2014   INR 4.10 08/21/2014   ANTI-COAG DOSE: Anticoagulation Dose Instructions as of 10/16/2014      [name] Tue Wed Thu Fri Sat   New Dose 5 mg 5 mg 5 mg 5 mg 5 mg 5 mg 5 mg      ANTICOAG SUMMARY: Anticoagulation Episode Summary    Current INR goal 2.5-3.5   Next INR check 11/06/2014   INR from last check 1.6! (10/16/2014)   Weekly max dose    Target end date    [facility]   Preferred lab    Send INR reminders to       Comments        ANTICOAG TODAY: Anticoagulation Summary as of 10/16/2014    INR goal 2.5-3.5   Selected INR 1.6! (10/16/2014)   Next INR check 11/06/2014   Target end date     Anticoagulation Episode Summary    [facility] location [facility]   Preferred lab    Send INR reminders to    Comments      PATIENT INSTRUCTIONS: Patient Instructions  Patient instructed to take medications as defined in the Anti-coagulation Track section of this encounter. Patient instructed to take today's dose. Patient verbalized understanding of these instructions.  FOLLOW-UP Return in about 3 weeks (around 11/06/2014) for Follow-up INR at 2:00pm.  Horatio Pel Clinical Pharmacy Resident 10/16/2014 2:31 PM  I agree with the assessment, diagnosis, and plan of care documented by the pharmacy resident.

## 2014-10-18 NOTE — Addendum Note (Signed)
Addended by: Forde Dandy on: 10/18/2014 09:33 AM   Modules accepted: Level of Service

## 2014-10-19 DIAGNOSIS — R600 Localized edema: Secondary | ICD-10-CM | POA: Diagnosis not present

## 2014-10-19 DIAGNOSIS — Z86718 Personal history of other venous thrombosis and embolism: Secondary | ICD-10-CM | POA: Diagnosis not present

## 2014-10-19 DIAGNOSIS — M7989 Other specified soft tissue disorders: Secondary | ICD-10-CM | POA: Diagnosis not present

## 2014-10-19 DIAGNOSIS — Z79899 Other long term (current) drug therapy: Secondary | ICD-10-CM | POA: Diagnosis not present

## 2014-10-19 DIAGNOSIS — M79662 Pain in left lower leg: Secondary | ICD-10-CM | POA: Diagnosis not present

## 2014-10-19 DIAGNOSIS — Z87891 Personal history of nicotine dependence: Secondary | ICD-10-CM | POA: Diagnosis not present

## 2014-10-19 DIAGNOSIS — Z7982 Long term (current) use of aspirin: Secondary | ICD-10-CM | POA: Diagnosis not present

## 2014-10-19 DIAGNOSIS — Z7901 Long term (current) use of anticoagulants: Secondary | ICD-10-CM | POA: Diagnosis not present

## 2014-10-30 ENCOUNTER — Ambulatory Visit (INDEPENDENT_AMBULATORY_CARE_PROVIDER_SITE_OTHER): Payer: Medicare Other | Admitting: Pharmacist

## 2014-10-30 ENCOUNTER — Ambulatory Visit (INDEPENDENT_AMBULATORY_CARE_PROVIDER_SITE_OTHER): Payer: Medicare Other | Admitting: Internal Medicine

## 2014-10-30 ENCOUNTER — Encounter: Payer: Self-pay | Admitting: Internal Medicine

## 2014-10-30 VITALS — BP 126/47 | HR 69 | Temp 98.2°F | Wt 313.9 lb

## 2014-10-30 DIAGNOSIS — I5022 Chronic systolic (congestive) heart failure: Secondary | ICD-10-CM

## 2014-10-30 DIAGNOSIS — I10 Essential (primary) hypertension: Secondary | ICD-10-CM

## 2014-10-30 DIAGNOSIS — Z7901 Long term (current) use of anticoagulants: Secondary | ICD-10-CM | POA: Diagnosis not present

## 2014-10-30 DIAGNOSIS — E785 Hyperlipidemia, unspecified: Secondary | ICD-10-CM

## 2014-10-30 DIAGNOSIS — I2699 Other pulmonary embolism without acute cor pulmonale: Secondary | ICD-10-CM

## 2014-10-30 DIAGNOSIS — IMO0001 Reserved for inherently not codable concepts without codable children: Secondary | ICD-10-CM

## 2014-10-30 DIAGNOSIS — I82509 Chronic embolism and thrombosis of unspecified deep veins of unspecified lower extremity: Secondary | ICD-10-CM | POA: Diagnosis not present

## 2014-10-30 DIAGNOSIS — S46819D Strain of other muscles, fascia and tendons at shoulder and upper arm level, unspecified arm, subsequent encounter: Secondary | ICD-10-CM

## 2014-10-30 DIAGNOSIS — M75100 Unspecified rotator cuff tear or rupture of unspecified shoulder, not specified as traumatic: Secondary | ICD-10-CM

## 2014-10-30 DIAGNOSIS — I509 Heart failure, unspecified: Secondary | ICD-10-CM | POA: Diagnosis not present

## 2014-10-30 DIAGNOSIS — R252 Cramp and spasm: Secondary | ICD-10-CM | POA: Diagnosis not present

## 2014-10-30 LAB — POCT INR: INR: 2.1

## 2014-10-30 LAB — BASIC METABOLIC PANEL WITH GFR
BUN: 15 mg/dL (ref 6–23)
CO2: 23 meq/L (ref 19–32)
CREATININE: 0.77 mg/dL (ref 0.50–1.10)
Calcium: 8.9 mg/dL (ref 8.4–10.5)
Chloride: 103 mEq/L (ref 96–112)
GFR, EST NON AFRICAN AMERICAN: 87 mL/min
GFR, Est African American: >89 mL/min
Glucose, Bld: 141 mg/dL — ABNORMAL HIGH (ref 70–99)
Potassium: 3.8 mEq/L (ref 3.5–5.3)
Sodium: 136 mEq/L (ref 135–145)

## 2014-10-30 LAB — MAGNESIUM: MAGNESIUM: 1.9 mg/dL (ref 1.5–2.5)

## 2014-10-30 MED ORDER — HYDROCODONE-ACETAMINOPHEN 10-325 MG PO TABS: 1.0000 | ORAL_TABLET | Freq: Four times a day (QID) | ORAL | Status: DC | PRN

## 2014-10-30 NOTE — Assessment & Plan Note (Signed)
-   on chronic coumadin therapy for PE and DVT.   - Recheck INR today with Dr. Gladstone Pih visit.

## 2014-10-30 NOTE — Assessment & Plan Note (Signed)
-   Patient reports bilateral leg cramping with walking and also while sitting although it sounds it may be much worse with walking. Given her history of CAD and HLD she needs her to be evaluated for vascular claudication with ABIs.  In addition she has stopped taking any potassum supplementation so I will check a BMP with magnesium to evaluate for electrolyte abnormalities that may be causing her symptoms. - ABIs - BMP with mag.  I do not suspect this is myalgias due to crestor as she is on a very low dose and has now been off the medication for 1 month with no improvement.  She may resume crestor. She does have a history of DVT and PE but has been mostly therapeutic with A/C, she has a repeat INR today with Dr. Elie Confer which I will follow up.

## 2014-10-30 NOTE — Assessment & Plan Note (Signed)
-   Has been off crestor as she thought this may cause her leg cramping - Instructed to resume crestor will need repeat Lipid panel at next visit.

## 2014-10-30 NOTE — Patient Instructions (Signed)
General Instructions: Please start taking your CRESTOR again.  I want to get ABI of your legs to evaluate the circulation.  I will also check you electrolytes and give you a call.  Thank you for bringing your medicines today. This helps [place] keep you safe from mistakes.   Progress Toward Treatment Goals:  Treatment Goal 05/29/2014  Blood pressure at goal    Self Care Goals & Plans:  Self Care Goal 07/31/2014  Manage my medications take my medicines as prescribed; bring my medications to every visit; refill my medications on time; follow the sick day instructions if I am sick  Monitor my health -  Eat healthy foods eat more vegetables; eat fruit for snacks and desserts; eat baked foods instead of fried foods; eat smaller portions  Be physically active find an activity I enjoy  Meeting treatment goals -    No flowsheet data found.   Care Management & Community Referrals:  Referral 05/29/2014  Referrals made for care management support none needed  Referrals made to community resources none

## 2014-10-30 NOTE — Progress Notes (Signed)
[facility] Subjective:   Patient ID: Breanna Wilkins female   DOB: [birth year] 54 y.o.   MRN: [id number]  HPI: [name] is a 54 y.o. female with a PMH detailed below who presents for 3 month follow up of her chronic pain due to supraspinatus tear.  She is treated with chronic narcotic medications for her supraspinatus tendon tear as she wants to avoid surgery as she had a perioperative MI previous surgery.  She typically uses at last 1 pill every day although occasionally she has a day that she does not need medication.  Some days she takes two and occasionally she will need 3 pills.  She is requesting a high strength as she feels she does not always get enough relief from one 7.5-325mg  pill.  She also notes she has had severe right leg calf muscles for about 2-3 days.  This has been an ongoing issue for her, occaisonaly she has severe leg cramping.  This happens both while resting but is sometimes worse with walking.  She has stopped crestor and has been off for over a month now with no relief of her muscle cramping.    Past Medical History  Diagnosis Date  . Shoulder pain   . Pulmonary embolus 01/16/10    on Coumadin indefinitely  . Abdominal hernia   . Hypertension   . Overweight(278.02)   . Lung nodule 12/2009    Very small, left upper lobe by CT June, 2011, one-year followup was stable  . Shortness of breath   . Warfarin anticoagulation   . Ejection fraction     65-70%, vigorous function, echo, March, 2011  . CHF (congestive heart failure)   . OSA (obstructive sleep apnea)     CPAP  . CAD (coronary artery disease)     Perioperative non-STEMI March, 2014  //   cardiac catheterization at time of non-STEMI, March, 2014, minor nonobstructive coronary disease, vigorous LV function, possibly vasospastic event versus stress cardiomyopathy. No further workup of coronary disease  . Ejection fraction     Vigorous function at time of catheterization October 06, 2012,   Current Outpatient Prescriptions  Medication Sig Dispense Refill  . carvedilol (COREG) 6.25 MG tablet Take 1 tablet (6.25 mg total) by mouth 2 (two) times daily with a meal. 60 tablet 11  . furosemide (LASIX) 80 MG tablet Take 1 tablet (80 mg total) by mouth daily. 90 tablet 3  . lisinopril (PRINIVIL,ZESTRIL) 10 MG tablet Take 1 tablet (10 mg total) by mouth daily. 90 tablet 3  . pantoprazole (PROTONIX) 40 MG tablet Take 1 tablet 30 minutes before meal (lunch/dinner) 90 tablet 2  . warfarin (COUMADIN) 10 MG tablet Take 1/2 tablet daily except on Mondays/Tuesdays--do NOT take warfarin on those days. 30 tablet 2  . aspirin 81 MG chewable tablet Chew 1 tablet (81 mg total) by mouth daily. 30 tablet 1  . BOOSTRIX 5-2.5-18.5 injection     . Cyanocobalamin (VITAMIN B 12 PO) Take by mouth.    Marland Kitchen HYDROcodone-acetaminophen (NORCO) 10-325 MG per tablet Take 1 tablet by mouth every 6 (six) hours as needed. 70 tablet 0  . rosuvastatin (CRESTOR) 5 MG tablet Take 1 tablet (5 mg total) by mouth at bedtime. 30 tablet 11   No current facility-administered medications for this visit.   Family History  Problem Relation Age of Onset  . Diabetes Father   . Diabetes Sister   . Diabetes Mother   . Deep vein thrombosis  Sister   . Ovarian cancer Mother   . Bone cancer Maternal Grandmother    History   Social History  . Marital Status: Single    Spouse Name: N/A  . Number of Children: N/A  . Years of Education: N/A   Social History Main Topics  . Smoking status: Former Research scientist (life sciences)  . Smokeless tobacco: Not on file     Comment: qoit 12 years ago  . Alcohol Use: 3.6 oz/week    0 Standard drinks or equivalent, 6 Cans of beer per week     Comment: 6 pack of beer  . Drug Use: No     Comment: smoked joint 2 weeks ago  . Sexual Activity: Not on file   Other Topics Concern  . None   Social History Narrative   Financial assistance approved for 100% discount at [facility] and has [facility] card per [employer]   12/25/2009   Review of Systems: Review of Systems  Constitutional: Negative for fever, chills, weight loss and malaise/fatigue.  Eyes: Negative for blurred vision.  Respiratory: Negative for cough and shortness of breath.   Cardiovascular: Negative for chest pain and leg swelling.  Gastrointestinal: Negative for heartburn and abdominal pain.  Genitourinary: Negative for dysuria.  Musculoskeletal: Positive for joint pain. Negative for myalgias.  Neurological: Negative for dizziness and headaches.  Endo/Heme/Allergies: Negative for polydipsia.  Psychiatric/Behavioral: Negative for substance abuse.     Objective:  Physical Exam: Filed Vitals:   10/30/14 1332  BP: 126/47  Pulse: 69  Temp: 98.2 F (36.8 C)  TempSrc: Oral  Weight: 313 lb 14.4 oz (142.384 kg)  SpO2: 99%  Physical Exam  Constitutional: She is well-developed, well-nourished, and in no distress. No distress.  HENT:  Head: Normocephalic and atraumatic.  Eyes: Conjunctivae are normal.  Cardiovascular: Normal rate, regular rhythm, normal heart sounds and intact distal pulses.   No murmur heard. 2+ pt and dp pulses bilaterally, mild tenderness at lateral aspect of right calf, no warmth or erythema.  Pulmonary/Chest: Effort normal and breath sounds normal. No respiratory distress. She has no wheezes. She has no rales.  Abdominal: Soft. Bowel sounds are normal. She exhibits no distension. There is no tenderness.  Musculoskeletal: She exhibits no edema.  Skin: Skin is warm and dry. She is not diaphoretic.  Psychiatric: Affect and judgment normal.  Nursing note and vitals reviewed.   Assessment & Plan:  Case discussed with Breanna Wilkins  CHF NYHA class II (symptoms with moderately strenuous activities) Wt Readings from Last 5 Encounters:  10/30/14 313 lb 14.4 oz (142.384 kg)  07/31/14 310 lb 1.6 oz (140.66 kg)  06/26/14 300 lb (136.079 kg)  05/29/14 321 lb (145.605 kg)  04/28/14 313 lb 1.9 oz (142.03 kg)   -  Weight overall stable, dyspnea symptoms are mostly undercontrol and does not appear significantly volume overloaded today.  Will continue Lasix 80mg  daily.   Long-term (current) use of anticoagulants - on chronic coumadin therapy for PE and DVT.   - Recheck INR today with Breanna Wilkins visit.   Leg cramping - Patient reports bilateral leg cramping with walking and also while sitting although it sounds it may be much worse with walking. Given her history of CAD and HLD she needs her to be evaluated for vascular claudication with ABIs.  In addition she has stopped taking any potassum supplementation so I will check a BMP with magnesium to evaluate for electrolyte abnormalities that may be causing her symptoms. - ABIs - BMP with  mag.  I do not suspect this is myalgias due to crestor as she is on a very low dose and has now been off the medication for 1 month with no improvement.  She may resume crestor. She does have a history of DVT and PE but has been mostly therapeutic with A/C, she has a repeat INR today with Dr. Elie Wilkins which I will follow up.   Hyperlipidemia - Has been off crestor as she thought this may cause her leg cramping - Instructed to resume crestor will need repeat Lipid panel at next visit.   Essential hypertension, benign BP Readings from Last 3 Encounters:  10/30/14 126/47  07/31/14 126/74  06/26/14 132/98    Lab Results  Component Value Date   NA 136 10/30/2014   K 3.8 10/30/2014   CREATININE 0.77 10/30/2014    Assessment: Blood pressure control:  controlled Progress toward BP goal:   at goal Comments:   Plan: Medications:  continue current medications Educational resources provided:   Self management tools provided:   Other plans:     Supraspinatus tendon tear - No red flags for opoid use. - Will increase strength to Norco 10-325 and continue #70 per month.  Will plan to check random UDS at next visit.     Medications Ordered Meds ordered this  encounter  Medications  . DISCONTD: HYDROcodone-acetaminophen (NORCO) 10-325 MG per tablet    Sig: Take 1 tablet by mouth every 6 (six) hours as needed.    Dispense:  70 tablet    Refill:  0    Rx 1/3 May be filled on or after 10/30/14  . DISCONTD: HYDROcodone-acetaminophen (NORCO) 10-325 MG per tablet    Sig: Take 1 tablet by mouth every 6 (six) hours as needed.    Dispense:  70 tablet    Refill:  0    Rx 2/3 May be filled 30 days after 10/30/14  . HYDROcodone-acetaminophen (NORCO) 10-325 MG per tablet    Sig: Take 1 tablet by mouth every 6 (six) hours as needed.    Dispense:  70 tablet    Refill:  0    Rx 3/3 May be filled 60 days after 10/30/14   Other Orders Orders Placed This Encounter  Procedures  . BMP with Estimated GFR ([id number])  . Magnesium  . Ankle brachial index    Standing Status: Future     Number of Occurrences:      Standing Expiration Date: 10/30/2015    Order Specific Question:  Where should this test be performed:    Answer:  [facility]

## 2014-10-30 NOTE — Progress Notes (Signed)
Anti-Coagulation Progress Note  Breanna Wilkins is a 54 y.o. female who is currently on an anti-coagulation regimen.  RECENT RESULTS:  Recent results are below, the most recent result is correlated with a dose of 35 mg. per week:  Lab Results  Component Value Date   INR 2.1 10/30/2014   INR 1.6 10/16/2014   INR 3.00 09/11/2014    ANTI-COAG DOSE:  Anticoagulation Dose Instructions as of 10/30/2014      [name] Tue Wed Thu Fri Sat   New Dose 5 mg 10 mg 5 mg 5 mg 5 mg 5 mg 5 mg      ANTICOAG SUMMARY:  Anticoagulation Episode Summary    Current INR goal 2.5-3.5   Next INR check 11/13/2014   INR from last check 2.1! (10/30/2014)   Weekly max dose    Target end date    [facility]   Preferred lab    Send INR reminders to       Comments         ANTICOAG TODAY:  Anticoagulation Summary as of 10/30/2014    INR goal 2.5-3.5   Selected INR 2.1! (10/30/2014)   Next INR check 11/13/2014   Target end date     Anticoagulation Episode Summary    [facility]   Preferred lab    Send INR reminders to    Comments       PATIENT INSTRUCTIONS:  Patient Instructions  Patient instructed to take medications as defined in the Anti-coagulation Track section of this encounter.  Patient instructed to take today's dose. Increasing to 1 full tablet on Monday's. Patient verbalized understanding of these instructions.       FOLLOW-UP  Return in about 2 weeks (around 11/13/2014) for Follow up INR @ 2:15pm.     Thank you for allowing pharmacy to be part of this patient's care team  [place], Pharm.D Clinical Pharmacy Resident Pager: [phone] 10/30/2014 .3:46 PM

## 2014-10-30 NOTE — Progress Notes (Signed)
Internal Medicine Clinic Attending  Case discussed with Dr. Hoffman at the time of the visit.  We reviewed the resident's history and exam and pertinent patient test results.  I agree with the assessment, diagnosis, and plan of care documented in the resident's note.  

## 2014-10-30 NOTE — Patient Instructions (Signed)
Patient instructed to take medications as defined in the Anti-coagulation Track section of this encounter.  Patient instructed to take today's dose. Increasing to 1 full tablet on Monday's. Patient verbalized understanding of these instructions.

## 2014-10-30 NOTE — Assessment & Plan Note (Addendum)
Wt Readings from Last 5 Encounters:  10/30/14 313 lb 14.4 oz (142.384 kg)  07/31/14 310 lb 1.6 oz (140.66 kg)  06/26/14 300 lb (136.079 kg)  05/29/14 321 lb (145.605 kg)  04/28/14 313 lb 1.9 oz (142.03 kg)   - Weight overall stable, dyspnea symptoms are mostly undercontrol and does not appear significantly volume overloaded today.  Will continue Lasix 80mg  daily.

## 2014-11-01 NOTE — Assessment & Plan Note (Signed)
BP Readings from Last 3 Encounters:  10/30/14 126/47  07/31/14 126/74  06/26/14 132/98    Lab Results  Component Value Date   NA 136 10/30/2014   K 3.8 10/30/2014   CREATININE 0.77 10/30/2014    Assessment: Blood pressure control:  controlled Progress toward BP goal:   at goal Comments:   Plan: Medications:  continue current medications Educational resources provided:   Self management tools provided:   Other plans:

## 2014-11-01 NOTE — Assessment & Plan Note (Signed)
-   No red flags for opoid use. - Will increase strength to Norco 10-325 and continue #70 per month.  Will plan to check random UDS at next visit.

## 2014-11-06 ENCOUNTER — Ambulatory Visit: Payer: Medicare Other

## 2014-11-10 ENCOUNTER — Encounter (HOSPITAL_COMMUNITY): Payer: Medicare Other

## 2014-11-13 ENCOUNTER — Ambulatory Visit (HOSPITAL_COMMUNITY)
Admission: RE | Admit: 2014-11-13 | Discharge: 2014-11-13 | Disposition: A | Discharge status: Home / Self Care | Payer: Medicare Other | Source: Ambulatory Visit | Attending: Internal Medicine | Admitting: Internal Medicine

## 2014-11-13 ENCOUNTER — Ambulatory Visit (INDEPENDENT_AMBULATORY_CARE_PROVIDER_SITE_OTHER): Payer: Medicare Other | Admitting: Pharmacist

## 2014-11-13 DIAGNOSIS — Z7901 Long term (current) use of anticoagulants: Secondary | ICD-10-CM

## 2014-11-13 DIAGNOSIS — Z86711 Personal history of pulmonary embolism: Secondary | ICD-10-CM

## 2014-11-13 DIAGNOSIS — Z86718 Personal history of other venous thrombosis and embolism: Secondary | ICD-10-CM

## 2014-11-13 DIAGNOSIS — R252 Cramp and spasm: Secondary | ICD-10-CM | POA: Diagnosis not present

## 2014-11-13 LAB — POCT INR: INR: 4.4

## 2014-11-13 NOTE — Progress Notes (Signed)
VASCULAR LAB PRELIMINARY  ARTERIAL  ABI completed:    RIGHT    LEFT    PRESSURE WAVEFORM  PRESSURE WAVEFORM  BRACHIAL 127 Triphasic BRACHIAL 115 Triphasic         AT 130 Triphasic AT 125 Triphasic  PT 145 Triphasic PT 132 Triphasic                  RIGHT LEFT  ABI 1.14 1.04   ABI's and waveforms within normal limits.  August Albino, RVT 11/13/2014, 1:10 PM

## 2014-11-13 NOTE — Progress Notes (Signed)
Anti-Coagulation Progress Note  Breanna Wilkins is a 54 y.o. female who is currently on an anti-coagulation regimen.  RECENT RESULTS:  Recent results are below, the most recent result is correlated with a dose of 40 mg. per week:  Lab Results  Component Value Date   INR 4.4 11/13/2014   INR 2.1 10/30/2014   INR 1.6 10/16/2014    ANTI-COAG DOSE:  Anticoagulation Dose Instructions as of 11/13/2014      [name] Tue Wed Thu Fri Sat   New Dose 5 mg 5 mg 5 mg 5 mg 5 mg 5 mg 5 mg    Description        Take 1/2 tablet by mouth daily.      ANTICOAG SUMMARY:  Anticoagulation Episode Summary    Current INR goal 2.5-3.5   Next INR check 12/04/2014   INR from last check 4.4! (11/13/2014)   Weekly max dose    Target end date    [facility]   Preferred lab    Send INR reminders to       Comments         ANTICOAG TODAY:  Anticoagulation Summary as of 11/13/2014    INR goal 2.5-3.5   Selected INR 4.4! (11/13/2014)   Next INR check 12/04/2014   Target end date     Anticoagulation Episode Summary    [facility]   Preferred lab    Send INR reminders to    Comments       PATIENT INSTRUCTIONS:  Patient Instructions  Patient instructed to take medications as defined in the Anti-coagulation Track section of this encounter.  Patient instructed to HOLD today's dose.  Patient verbalized understanding of these instructions.        FOLLOW-UP  Return in 3 weeks (on 12/04/2014) for Follow up INR @ 2:30pm.    Thank you for allowing pharmacy to be part of this patient's care team  [place], Pharm.D Clinical Pharmacy Resident Pager: [phone] 11/13/2014 .2:42 PM

## 2014-11-13 NOTE — Patient Instructions (Signed)
Patient instructed to take medications as defined in the Anti-coagulation Track section of this encounter.  Patient instructed to HOLD today's dose.  Patient verbalized understanding of these instructions.

## 2014-11-13 NOTE — Progress Notes (Signed)
INTERNAL MEDICINE TEACHING ATTENDING ADDENDUM - Breanna Wilkins M.D  Duration- indefinite, Indication- PE, DVT, INR- supratherapeutic. Agree with recommendations as outlined in pharmacy note.

## 2014-11-16 ENCOUNTER — Telehealth: Payer: Self-pay | Admitting: *Deleted

## 2014-11-16 NOTE — Telephone Encounter (Signed)
Pt called today and is asking for the results of ABI of legs and labs. Pt # [id number]  Please call pt.

## 2014-11-17 NOTE — Telephone Encounter (Signed)
Called and left message.

## 2014-11-20 ENCOUNTER — Other Ambulatory Visit: Payer: Self-pay | Admitting: Internal Medicine

## 2014-11-20 MED ORDER — CARVEDILOL 6.25 MG PO TABS: 6.2500 mg | ORAL_TABLET | Freq: Two times a day (BID) | ORAL | Status: DC

## 2014-11-20 NOTE — Telephone Encounter (Signed)
Called again and spoke to Breanna Wilkins, we discussed her results.  She also asked for 90 day prescriptions of carvedilol which i sent to her pharmacy.

## 2014-12-04 ENCOUNTER — Ambulatory Visit (INDEPENDENT_AMBULATORY_CARE_PROVIDER_SITE_OTHER): Payer: Medicare Other | Admitting: Pharmacist

## 2014-12-04 DIAGNOSIS — Z86718 Personal history of other venous thrombosis and embolism: Secondary | ICD-10-CM

## 2014-12-04 DIAGNOSIS — Z7901 Long term (current) use of anticoagulants: Secondary | ICD-10-CM | POA: Diagnosis not present

## 2014-12-04 DIAGNOSIS — Z86711 Personal history of pulmonary embolism: Secondary | ICD-10-CM

## 2014-12-04 LAB — POCT INR: INR: 4

## 2014-12-04 MED ORDER — WARFARIN SODIUM 5 MG PO TABS: ORAL_TABLET | ORAL | Status: DC

## 2014-12-04 NOTE — Progress Notes (Signed)
  Anti-Coagulation Progress Note  Breanna Wilkins is a 54 y.o. female who is currently on an anti-coagulation regimen.    RECENT RESULTS: Recent results are below, the most recent result is correlated with a dose of 35 mg. per week:  She HAD been on 10mg  strength tablets. This tablet strength is being DISCONTINUED and a prescription has been sent electronically to her pharmacy for the FIVE (5)mg strength tablets which will permit "tighter" ability to titrate up/down--by 2.5mg  increments, etc. Patient aware and accepting of this plan. She understands to NOT take warfarin from each bottle--now ONLY her 5mg  strength prescription.  Lab Results  Component Value Date   INR 4.00 12/04/2014   INR 4.4 11/13/2014   INR 2.1 10/30/2014    ANTI-COAG DOSE: Anticoagulation Dose Instructions as of 12/04/2014      [name] Tue Wed Thu Fri Sat   New Dose 5 mg 2.5 mg 5 mg 5 mg 2.5 mg 5 mg 5 mg    Description        Take 1/2 tablet by mouth daily.       ANTICOAG SUMMARY: Anticoagulation Episode Summary    Current INR goal 2.5-3.5   Next INR check 12/14/2014   INR from last check 4.00! (12/04/2014)   Weekly max dose    Target end date    [facility]   Preferred lab    Send INR reminders to       Comments         ANTICOAG TODAY: Anticoagulation Summary as of 12/04/2014    INR goal 2.5-3.5   Selected INR 4.00! (12/04/2014)   Next INR check 12/14/2014   Target end date     Anticoagulation Episode Summary    [facility]   Preferred lab    Send INR reminders to    Comments       PATIENT INSTRUCTIONS: Patient Instructions  Patient instructed to take medications as defined in the Anti-coagulation Track section of this encounter.  Patient has had warfarin 10mg  strength tablets discontinued. Patient will now be taking 5mg  strength warfarin tablets which permit "tighter" ability to titrate up/down as may be required in 2.5mg  increments.  Patient  instructed to take today's dose.  Patient verbalized understanding of these instructions.       FOLLOW-UP Return in 10 days (on 12/14/2014) for Follow up INR at 2:15PM on THURSDAY Dec 14, 2014.  [name], III Pharm.D., CACP

## 2014-12-04 NOTE — Patient Instructions (Signed)
Patient instructed to take medications as defined in the Anti-coagulation Track section of this encounter.  Patient has had warfarin 10mg  strength tablets discontinued. Patient will now be taking 5mg  strength warfarin tablets which permit "tighter" ability to titrate up/down as may be required in 2.5mg  increments.  Patient instructed to take today's dose.  Patient verbalized understanding of these instructions.

## 2014-12-05 NOTE — Progress Notes (Signed)
I have reviewed Dr. Gladstone Pih note.  Breanna Wilkins is on anticoagulation for PE/DVT.  She prefers lifelong coumadin per her chart.  Her INR was elevated and plans for changes to therapy indicated in note.

## 2014-12-06 DIAGNOSIS — G4733 Obstructive sleep apnea (adult) (pediatric): Secondary | ICD-10-CM | POA: Diagnosis not present

## 2014-12-11 ENCOUNTER — Other Ambulatory Visit: Payer: Self-pay | Admitting: *Deleted

## 2014-12-11 NOTE — Telephone Encounter (Signed)
Insurance request a 90 day supply to improve pt adherence.  Please resend.

## 2014-12-12 MED ORDER — ROSUVASTATIN CALCIUM 5 MG PO TABS: 5.0000 mg | ORAL_TABLET | Freq: Every day | ORAL | Status: DC

## 2014-12-12 NOTE — Addendum Note (Signed)
Addended by: Lucious Groves on: 12/12/2014 07:42 PM   Modules accepted: Orders

## 2014-12-14 ENCOUNTER — Ambulatory Visit (INDEPENDENT_AMBULATORY_CARE_PROVIDER_SITE_OTHER): Payer: Medicare Other | Admitting: Pharmacist

## 2014-12-14 DIAGNOSIS — Z7901 Long term (current) use of anticoagulants: Secondary | ICD-10-CM

## 2014-12-14 DIAGNOSIS — I2699 Other pulmonary embolism without acute cor pulmonale: Secondary | ICD-10-CM

## 2014-12-14 LAB — POCT INR: INR: 3.3

## 2014-12-14 NOTE — Patient Instructions (Signed)
Patient instructed to take medications as defined in the Anti-coagulation Track section of this encounter.  Patient instructed to take today's dose.  Patient verbalized understanding of these instructions.    

## 2014-12-14 NOTE — Progress Notes (Signed)
Anti-Coagulation Progress Note  Breanna Wilkins is a 54 y.o. female who is currently on an anti-coagulation regimen.    RECENT RESULTS: Recent results are below, the most recent result is correlated with a dose of 30 mg. per week: Lab Results  Component Value Date   INR 3.3 12/14/2014   INR 4.00 12/04/2014   INR 4.4 11/13/2014    ANTI-COAG DOSE: Anticoagulation Dose Instructions as of 12/14/2014      [name] Tue Wed Thu Fri Sat   New Dose 5 mg 2.5 mg 5 mg 5 mg 2.5 mg 5 mg 5 mg       ANTICOAG SUMMARY: Anticoagulation Episode Summary    Current INR goal 2.5-3.5   Next INR check 01/08/2015   INR from last check 3.3 (12/14/2014)   Weekly max dose    Target end date    [facility]   Preferred lab    Send INR reminders to       Comments         ANTICOAG TODAY: Anticoagulation Summary as of 12/14/2014    INR goal 2.5-3.5   Selected INR 3.3 (12/14/2014)   Next INR check 01/08/2015   Target end date     Anticoagulation Episode Summary    [facility]   Preferred lab    Send INR reminders to    Comments       PATIENT INSTRUCTIONS: Patient Instructions  Patient instructed to take medications as defined in the Anti-coagulation Track section of this encounter.  Patient instructed to take today's dose.  Patient verbalized understanding of these instructions.       FOLLOW-UP Return in 4 weeks (on 01/08/2015) for Follow up INR at 2:15PM.  [name], III Pharm.D., CACP

## 2015-01-08 ENCOUNTER — Ambulatory Visit (INDEPENDENT_AMBULATORY_CARE_PROVIDER_SITE_OTHER): Payer: Medicare Other | Admitting: Pharmacist

## 2015-01-08 DIAGNOSIS — Z7901 Long term (current) use of anticoagulants: Secondary | ICD-10-CM | POA: Diagnosis not present

## 2015-01-08 LAB — POCT INR: INR: 3.1

## 2015-01-08 NOTE — Progress Notes (Signed)
Anti-Coagulation Progress Note  Breanna Wilkins is a 54 y.o. female who is currently on an anti-coagulation regimen.    RECENT RESULTS: Recent results are below, the most recent result is correlated with a dose of 30 mg. per week: Lab Results  Component Value Date   INR 3.10 01/08/2015   INR 3.3 12/14/2014   INR 4.00 12/04/2014    ANTI-COAG DOSE: Anticoagulation Dose Instructions as of 01/08/2015      [name] Tue Wed Thu Fri Sat   New Dose 5 mg 2.5 mg 5 mg 5 mg 2.5 mg 5 mg 5 mg       ANTICOAG SUMMARY: Anticoagulation Episode Summary    Current INR goal 2.5-3.5   Next INR check 02/05/2015   INR from last check 3.10 (01/08/2015)   Weekly max dose    Target end date    [facility]   Preferred lab    Send INR reminders to       Comments         ANTICOAG TODAY: Anticoagulation Summary as of 01/08/2015    INR goal 2.5-3.5   Selected INR 3.10 (01/08/2015)   Next INR check 02/05/2015   Target end date     Anticoagulation Episode Summary    [facility]   Preferred lab    Send INR reminders to    Comments       PATIENT INSTRUCTIONS: Patient Instructions  Patient instructed to take medications as defined in the Anti-coagulation Track section of this encounter.  Patient instructed to take today's dose.  Patient verbalized understanding of these instructions.       FOLLOW-UP Return in 4 weeks (on 02/05/2015) for Follow up INR at [place], III Pharm.D., CACP

## 2015-01-08 NOTE — Patient Instructions (Signed)
Patient instructed to take medications as defined in the Anti-coagulation Track section of this encounter.  Patient instructed to take today's dose.  Patient verbalized understanding of these instructions.    

## 2015-01-11 NOTE — Progress Notes (Signed)
Have reviewed Dr. Gladstone Pih note.  On anticoagulation for recurrent VTE.  INR elevated, dose decreased.

## 2015-01-29 NOTE — Telephone Encounter (Signed)
Norco 7.5/325 mg # 2 and # 3 have been voided and destroyed.

## 2015-02-01 ENCOUNTER — Ambulatory Visit (INDEPENDENT_AMBULATORY_CARE_PROVIDER_SITE_OTHER): Payer: Medicare Other | Admitting: Pharmacist

## 2015-02-01 DIAGNOSIS — Z7901 Long term (current) use of anticoagulants: Secondary | ICD-10-CM | POA: Diagnosis not present

## 2015-02-01 DIAGNOSIS — Z86711 Personal history of pulmonary embolism: Secondary | ICD-10-CM | POA: Diagnosis not present

## 2015-02-01 DIAGNOSIS — Z86718 Personal history of other venous thrombosis and embolism: Secondary | ICD-10-CM

## 2015-02-01 LAB — POCT INR: INR: 3.8

## 2015-02-01 MED ORDER — WARFARIN SODIUM 5 MG PO TABS: ORAL_TABLET | ORAL | Status: DC

## 2015-02-01 NOTE — Patient Instructions (Signed)
Patient educated about medication as defined in this encounter and verbalized understanding by repeating back instructions provided.   

## 2015-02-01 NOTE — Progress Notes (Signed)
CLINICAL PHARMACIST ANTICOAG NOTE Breanna Wilkins is a 54 y.o. female who reports to the clinic for monitoring of warfarin treatment.    Indication: DVT and PEhistory Duration: indefinite  Anticoagulation Clinic Visit History: Anticoagulation Episode Summary    Current INR goal 2.5-3.5   Next INR check 02/19/2015   INR from last check 3.8! (02/01/2015)   Weekly max dose    Target end date    [facility]   Preferred lab    Send INR reminders to       Comments        ASSESSMENT Recent Results: Recent results are below, the most recent result is correlated with a dose of 30 mg per week: Lab Results  Component Value Date   INR 3.8 02/01/2015   INR 3.10 01/08/2015   INR 3.3 12/14/2014    INR today: Supratherapeutic  Anticoagulation Dosing: INR as of 02/01/2015 and Previous Dosing Information    INR Dt INR Goal [name]   02/01/2015 3.8 2.5-3.5 30 mg 5 mg 2.5 mg 5 mg 5 mg 2.5 mg 5 mg 5 mg    Anticoagulation Dose Instructions as of 02/01/2015      Total [name]   New Dose 27.5 mg 5 mg 2.5 mg 5 mg 5 mg 2.5 mg 5 mg 2.5 mg     (5 mg x 1)  (5 mg x 0.5)  (5 mg x 1)  (5 mg x 1)  (5 mg x 0.5)  (5 mg x 1)  (5 mg x 0.5)                           PLAN Weekly dose was decreased by 8% to 27.5 mg per week  Patient Instructions  Patient educated about medication as defined in this encounter and verbalized understanding by repeating back instructions provided.   Follow-up Return in about 3 weeks (around 02/19/2015) for For follow up INR on 02/19/2015 around 2:30 pm.  [name] Clinical Pharmacist

## 2015-02-05 ENCOUNTER — Ambulatory Visit: Payer: Medicare Other

## 2015-02-19 ENCOUNTER — Other Ambulatory Visit: Payer: Self-pay | Admitting: Internal Medicine

## 2015-02-19 ENCOUNTER — Ambulatory Visit (INDEPENDENT_AMBULATORY_CARE_PROVIDER_SITE_OTHER): Payer: Medicare Other | Admitting: Pharmacist

## 2015-02-19 DIAGNOSIS — Z86718 Personal history of other venous thrombosis and embolism: Secondary | ICD-10-CM | POA: Diagnosis not present

## 2015-02-19 DIAGNOSIS — Z7901 Long term (current) use of anticoagulants: Secondary | ICD-10-CM

## 2015-02-19 LAB — POCT INR: INR: 2.4

## 2015-02-19 NOTE — Progress Notes (Signed)
Anti-Coagulation Progress Note  Breanna Wilkins is a 54 y.o. female who is currently on an anti-coagulation regimen.    RECENT RESULTS: Recent results are below, the most recent result is correlated with a dose of 27.5 mg. per week: Lab Results  Component Value Date   INR 2.40 02/19/2015   INR 3.8 02/01/2015   INR 3.10 01/08/2015    ANTI-COAG DOSE: Anticoagulation Dose Instructions as of 02/19/2015      [name] Tue Wed Thu Fri Sat   New Dose 5 mg 5 mg 5 mg 5 mg 2.5 mg 5 mg 2.5 mg       ANTICOAG SUMMARY: Anticoagulation Episode Summary    Current INR goal 2.5-3.5   Next INR check 03/12/2015   INR from last check 2.40! (02/19/2015)   Weekly max dose    Target end date    [facility]   Preferred lab    Send INR reminders to       Comments         ANTICOAG TODAY: Anticoagulation Summary as of 02/19/2015    INR goal 2.5-3.5   Selected INR 2.40! (02/19/2015)   Next INR check 03/12/2015   Target end date     Anticoagulation Episode Summary    [facility]   Preferred lab    Send INR reminders to    Comments       PATIENT INSTRUCTIONS: Patient Instructions  Patient instructed to take medications as defined in the Anti-coagulation Track section of this encounter.  Patient instructed to take today's dose.  Patient verbalized understanding of these instructions.       FOLLOW-UP Return in 3 weeks (on 03/12/2015) for Follow up INR at [place], III Pharm.D., CACP

## 2015-02-19 NOTE — Patient Instructions (Signed)
Patient instructed to take medications as defined in the Anti-coagulation Track section of this encounter.  Patient instructed to take today's dose.  Patient verbalized understanding of these instructions.    

## 2015-02-21 NOTE — Telephone Encounter (Signed)
Pt aware.

## 2015-02-21 NOTE — Telephone Encounter (Signed)
Pt requesting meds to be filled. Please call pt back.

## 2015-03-08 ENCOUNTER — Ambulatory Visit (INDEPENDENT_AMBULATORY_CARE_PROVIDER_SITE_OTHER): Payer: Medicare Other | Admitting: Internal Medicine

## 2015-03-08 ENCOUNTER — Encounter: Payer: Self-pay | Admitting: Internal Medicine

## 2015-03-08 VITALS — BP 170/83 | HR 78 | Temp 97.6°F | Ht 67.0 in | Wt 310.8 lb

## 2015-03-08 DIAGNOSIS — M25561 Pain in right knee: Secondary | ICD-10-CM | POA: Diagnosis not present

## 2015-03-08 DIAGNOSIS — M75100 Unspecified rotator cuff tear or rupture of unspecified shoulder, not specified as traumatic: Secondary | ICD-10-CM

## 2015-03-08 DIAGNOSIS — Z79899 Other long term (current) drug therapy: Secondary | ICD-10-CM

## 2015-03-08 DIAGNOSIS — Z7901 Long term (current) use of anticoagulants: Secondary | ICD-10-CM | POA: Diagnosis not present

## 2015-03-08 DIAGNOSIS — IMO0001 Reserved for inherently not codable concepts without codable children: Secondary | ICD-10-CM

## 2015-03-08 DIAGNOSIS — S46819D Strain of other muscles, fascia and tendons at shoulder and upper arm level, unspecified arm, subsequent encounter: Secondary | ICD-10-CM

## 2015-03-08 LAB — POCT INR: INR: 3.9

## 2015-03-08 MED ORDER — METHYLPREDNISOLONE 4 MG PO TBPK
ORAL_TABLET | ORAL | Status: DC
Start: 2015-03-08 — End: 2015-06-07

## 2015-03-08 MED ORDER — HYDROCODONE-ACETAMINOPHEN 10-325 MG PO TABS: 1.0000 | ORAL_TABLET | Freq: Four times a day (QID) | ORAL | Status: DC | PRN

## 2015-03-08 NOTE — Patient Instructions (Signed)
General Instructions: Please take the steroid dose pack as directed. Please return if not better  Please bring your medicines with you each time you come to clinic.  Medicines may include prescription medications, over-the-counter medications, herbal remedies, eye drops, vitamins, or other pills.   Progress Toward Treatment Goals:  Treatment Goal 05/29/2014  Blood pressure at goal    Self Care Goals & Plans:  Self Care Goal 03/08/2015  Manage my medications take my medicines as prescribed; bring my medications to every visit; refill my medications on time  Monitor my health -  Eat healthy foods eat more vegetables; eat baked foods instead of fried foods; eat foods that are low in salt; drink diet soda or water instead of juice or soda  Be physically active find an activity I enjoy  Meeting treatment goals -    No flowsheet data found.   Care Management & Community Referrals:

## 2015-03-11 NOTE — Assessment & Plan Note (Signed)
INR supratheraputic at 3.9, instructed to hold tonight's dose of warfarin and she will follow up with Dr Elie Confer on Monday.
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
## 2015-03-11 NOTE — Assessment & Plan Note (Signed)
I suspect she may have a component of osteoarthritis of her left knee due to her weight, however I would have liked to rule out crystal induced arthropothy with a diagnostic aspiration.  I checked a INR and she was supratheraputic so I will no do a joint arthrocentesis today.  Will instead treat her with a medrol dose pack.  Follow up PRN.  If continues to have severe pain can consider plain films and joint injection.

## 2015-03-11 NOTE — Assessment & Plan Note (Signed)
She is on chronic narcotic therapy for her supraspinatus tear for which she has indefinitely prolonged surgery due to previous surgical complications.  Her pain is currently controlled on her current dose of Norco 10-325 (#70 pills)  She recently filled her last Rx so I will provide her with two additional months of Rx.

## 2015-03-12 ENCOUNTER — Ambulatory Visit (INDEPENDENT_AMBULATORY_CARE_PROVIDER_SITE_OTHER): Payer: Medicare Other | Admitting: Pharmacist

## 2015-03-12 DIAGNOSIS — Z7901 Long term (current) use of anticoagulants: Secondary | ICD-10-CM | POA: Diagnosis not present

## 2015-03-12 DIAGNOSIS — Z86718 Personal history of other venous thrombosis and embolism: Secondary | ICD-10-CM | POA: Diagnosis not present

## 2015-03-12 LAB — POCT INR: INR: 2.3

## 2015-03-12 MED ORDER — WARFARIN SODIUM 5 MG PO TABS
5.0000 mg | ORAL_TABLET | Freq: Every day | ORAL | Status: DC
Start: 2015-03-12 — End: 2015-06-07

## 2015-03-12 NOTE — Progress Notes (Signed)
Medicine attending: I personally interviewed and briefly examined this patient, and reviewed pertinent clinical laboratory  data  with resident physician Dr.Erik [name] Lyons and we discussed a   management plan.

## 2015-03-12 NOTE — Progress Notes (Signed)
Anti-Coagulation Progress Note  Breanna Wilkins is a 54 y.o. female who is currently on an anti-coagulation regimen.    RECENT RESULTS: Recent results are below, the most recent result is correlated with a dose of 30 mg. per week: Lab Results  Component Value Date   INR 2.30 03/12/2015   INR 3.9 03/08/2015   INR 2.40 02/19/2015    ANTI-COAG DOSE: Anticoagulation Dose Instructions as of 03/12/2015      [name] Tue Wed Thu Fri Sat   New Dose 5 mg 5 mg 5 mg 5 mg 5 mg 5 mg 5 mg       ANTICOAG SUMMARY: Anticoagulation Episode Summary    Current INR goal 2.5-3.5   Next INR check 04/02/2015   INR from last check 2.30! (03/12/2015)   Weekly max dose    Target end date    [facility]   Preferred lab    Send INR reminders to       Comments         ANTICOAG TODAY: Anticoagulation Summary as of 03/12/2015    INR goal 2.5-3.5   Selected INR 2.30! (03/12/2015)   Next INR check 04/02/2015   Target end date     Anticoagulation Episode Summary    [facility]   Preferred lab    Send INR reminders to    Comments       PATIENT INSTRUCTIONS: Patient Instructions  Patient instructed to take medications as defined in the Anti-coagulation Track section of this encounter.  Patient instructed to take today's dose.  Patient verbalized understanding of these instructions.       FOLLOW-UP Return in 3 weeks (on 04/02/2015) for Follow up INR at 2:00PM.  [name], III Pharm.D., CACP

## 2015-03-12 NOTE — Progress Notes (Signed)
Indication: Venous thromboembolism. Duration: Lifelong per patient preference. INR: Below target. Agree with Dr. Gladstone Pih assessment and plan.

## 2015-03-12 NOTE — Patient Instructions (Signed)
Patient instructed to take medications as defined in the Anti-coagulation Track section of this encounter.  Patient instructed to take today's dose.  Patient verbalized understanding of these instructions.    

## 2015-03-19 ENCOUNTER — Encounter: Payer: Self-pay | Admitting: Gastroenterology

## 2015-03-20 ENCOUNTER — Other Ambulatory Visit: Payer: Self-pay | Admitting: Internal Medicine

## 2015-03-20 DIAGNOSIS — I509 Heart failure, unspecified: Secondary | ICD-10-CM

## 2015-03-20 MED ORDER — FUROSEMIDE 80 MG PO TABS: 80.0000 mg | ORAL_TABLET | Freq: Every day | ORAL | Status: DC

## 2015-03-20 NOTE — Telephone Encounter (Signed)
Patient needing her Furosemide refilled @ the [facility] on [address]

## 2015-04-02 ENCOUNTER — Ambulatory Visit (INDEPENDENT_AMBULATORY_CARE_PROVIDER_SITE_OTHER): Payer: Medicare Other | Admitting: Internal Medicine

## 2015-04-02 ENCOUNTER — Ambulatory Visit (HOSPITAL_COMMUNITY)
Admission: RE | Admit: 2015-04-02 | Discharge: 2015-04-02 | Disposition: A | Discharge status: Home / Self Care | Payer: Medicare Other | Source: Ambulatory Visit | Attending: Internal Medicine | Admitting: Internal Medicine

## 2015-04-02 ENCOUNTER — Ambulatory Visit (INDEPENDENT_AMBULATORY_CARE_PROVIDER_SITE_OTHER): Payer: Medicare Other | Admitting: Pharmacist

## 2015-04-02 ENCOUNTER — Encounter: Payer: Self-pay | Admitting: Internal Medicine

## 2015-04-02 VITALS — BP 107/63 | HR 86 | Temp 97.9°F | Wt 310.5 lb

## 2015-04-02 DIAGNOSIS — M179 Osteoarthritis of knee, unspecified: Secondary | ICD-10-CM | POA: Diagnosis not present

## 2015-04-02 DIAGNOSIS — Z7901 Long term (current) use of anticoagulants: Secondary | ICD-10-CM | POA: Diagnosis not present

## 2015-04-02 DIAGNOSIS — Z6841 Body Mass Index (BMI) 40.0 and over, adult: Secondary | ICD-10-CM

## 2015-04-02 DIAGNOSIS — M25561 Pain in right knee: Secondary | ICD-10-CM | POA: Insufficient documentation

## 2015-04-02 LAB — POCT INR: INR: 3.5

## 2015-04-02 MED ORDER — IBUPROFEN 400 MG PO TABS: 400.0000 mg | ORAL_TABLET | Freq: Three times a day (TID) | ORAL | Status: DC | PRN

## 2015-04-02 NOTE — Patient Instructions (Signed)
We will get an X-Ray of your right knee today. You can take Ibuprofen 400 mg every 8 hours as needed for pain. Please continue with your diet and exercise. You can try water exercises which can put less stress on your joints.

## 2015-04-02 NOTE — Progress Notes (Signed)
Patient ID: Breanna Wilkins, female   DOB: 1961/05/04, 54 y.o.   MRN: [id number]   Subjective:   Patient ID: Breanna Wilkins female   DOB: 1960/09/08 54 y.o.   MRN: [id number]  HPI: Breanna Wilkins is a 54 y.o. female with PMH as listed below who presents with complaints of right knee pain.  Patient was asked if she would like to receive flu shot, but was hesitant and will think about it in the next week.  Please see problem list for details.    Past Medical History  Diagnosis Date  . Shoulder pain   . Pulmonary embolus 01/16/10    on Coumadin indefinitely  . Abdominal hernia   . Hypertension   . Overweight(278.02)   . Lung nodule 12/2009    Very small, left upper lobe by CT June, 2011, one-year followup was stable  . Shortness of breath   . Warfarin anticoagulation   . Ejection fraction     65-70%, vigorous function, echo, March, 2011  . CHF (congestive heart failure)   . OSA (obstructive sleep apnea)     CPAP  . CAD (coronary artery disease)     Perioperative non-STEMI March, 2014  //   cardiac catheterization at time of non-STEMI, March, 2014, minor nonobstructive coronary disease, vigorous LV function, possibly vasospastic event versus stress cardiomyopathy. No further workup of coronary disease  . Ejection fraction     Vigorous function at time of catheterization October 06, 2012,   Current Outpatient Prescriptions  Medication Sig Dispense Refill  . aspirin 81 MG chewable tablet Chew 1 tablet (81 mg total) by mouth daily. 30 tablet 1  . BOOSTRIX 5-2.5-18.5 injection     . carvedilol (COREG) 6.25 MG tablet Take 1 tablet (6.25 mg total) by mouth 2 (two) times daily with a meal. 180 tablet 3  . Cyanocobalamin (VITAMIN B 12 PO) Take by mouth.    . furosemide (LASIX) 80 MG tablet Take 1 tablet (80 mg total) by mouth daily. 90 tablet 3  . HYDROcodone-acetaminophen (NORCO) 10-325 MG per tablet Take 1 tablet by mouth every 6 (six) hours as needed. 70 tablet 0  . ibuprofen  (ADVIL,MOTRIN) 400 MG tablet Take 1 tablet (400 mg total) by mouth every 8 (eight) hours as needed for moderate pain. 90 tablet 0  . lisinopril (PRINIVIL,ZESTRIL) 10 MG tablet Take 1 tablet (10 mg total) by mouth daily. 90 tablet 3  . methylPREDNISolone (MEDROL DOSEPAK) 4 MG TBPK tablet Taper as directed on package 21 tablet 0  . pantoprazole (PROTONIX) 40 MG tablet TAKE 1 TABLET BY MOUTH 30 MINUTES BEFORE LUNCH OR DINNER 90 tablet 0  . rosuvastatin (CRESTOR) 5 MG tablet Take 1 tablet (5 mg total) by mouth at bedtime. 90 tablet 3  . warfarin (COUMADIN) 5 MG tablet Take 1 tablet (5 mg total) by mouth daily at 6 PM. 31 tablet 2   No current facility-administered medications for this visit.   Family History  Problem Relation Age of Onset  . Diabetes Father   . Diabetes Sister   . Diabetes Mother   . Deep vein thrombosis Sister   . Ovarian cancer Mother   . Bone cancer Maternal Grandmother    Social History   Social History  . Marital Status: Single    Spouse Name: N/A  . Number of Children: N/A  . Years of Education: N/A   Social History Main Topics  . Smoking status: Former Research scientist (life sciences)  . Smokeless tobacco: None  Comment: qoit 12 years ago  . Alcohol Use: 3.6 oz/week    0 Standard drinks or equivalent, 6 Cans of beer per week     Comment: Beer sometimes.  . Drug Use: No     Comment: smoked joint 2 weeks ago  . Sexual Activity: Not Asked   Other Topics Concern  . None   Social History Narrative   Financial assistance approved for 100% discount at [facility] and has [facility] card per [employer]   12/25/2009   Review of Systems: Review of Systems  Constitutional: Negative for fever, chills and diaphoresis.  Respiratory: Negative for shortness of breath and wheezing.   Cardiovascular: Negative for chest pain and palpitations.  Gastrointestinal: Negative for nausea and vomiting.  Musculoskeletal: Negative for myalgias, back pain, falls and neck pain.       Right knee pain.  Skin:  Negative for itching and rash.  Neurological: Negative for focal weakness and headaches.    Objective:  Physical Exam: Filed Vitals:   04/02/15 1336  BP: 107/63  Pulse: 86  Temp: 97.9 F (36.6 C)  TempSrc: Oral  Weight: 310 lb 8 oz (140.842 kg)  SpO2: 99%   Physical Exam  Constitutional: She is oriented to person, place, and time. She appears well-developed and well-nourished.  Obese appearance.  HENT:  Head: Normocephalic and atraumatic.  Cardiovascular: Normal rate and regular rhythm.   Pulmonary/Chest: Effort normal and breath sounds normal.  Musculoskeletal: Normal range of motion. She exhibits no edema or tenderness.  Neurological: She is alert and oriented to person, place, and time.  Skin: Skin is warm. No rash noted. No erythema.    Assessment & Plan:  Please see problem based charting.

## 2015-04-02 NOTE — Patient Instructions (Signed)
Patient instructed to take medications as defined in the Anti-coagulation Track section of this encounter.  Patient instructed to take today's dose.  Patient verbalized understanding of these instructions.    

## 2015-04-02 NOTE — Assessment & Plan Note (Addendum)
Patient with right knee pain described as throbbing and constant for the last 4-5 months. Her pain is worse at night and when standing for long periods, better with movement. She currently takes Norco 10-325 mg as needed but states she has had to take it more often recently for pain. She was seen on 03/08/15 in clinic and started on Medrol dose pack for 10 days which was helpful, but patient now is trying to lose weight and does not want anymore steroids. She denies any trauma or injury to the knee. On exam, there is no swelling, erythema, effusion or fluctuance, or warmth to the knee when compared to the left. This is most likely osteoarthritic in nature due to her weight and age. She has used Ibuprofen in the past which alleviated her pain.  -Right knee xray -Ibuprofen 400 mg TID, may consider increasing if no improvement -Weight loss with diet and exercise. Patient says she has cut out fatty and fried foods and will try cycling and water exercises for weight loss. -Consider joint injection or PT if symptoms become severe  Addendum: Will hold off on Ibuprofen for now as patient has increased bleeding risk with supratherapeutic INR of 3.5. Left voicemail with patient with advise to hold Ibuprofen use.

## 2015-04-02 NOTE — Progress Notes (Signed)
Anti-Coagulation Progress Note  Breanna Wilkins is a 54 y.o. female who is currently on an anti-coagulation regimen.    RECENT RESULTS: Recent results are below, the most recent result is correlated with a dose of 35 mg. per week: Lab Results  Component Value Date   INR 3.50 04/02/2015   INR 2.30 03/12/2015   INR 3.9 03/08/2015    ANTI-COAG DOSE: Anticoagulation Dose Instructions as of 04/02/2015      [name] Tue Wed Thu Fri Sat   New Dose 5 mg 2.5 mg 5 mg 5 mg 5 mg 5 mg 5 mg       ANTICOAG SUMMARY: Anticoagulation Episode Summary    Current INR goal 2.5-3.5   Next INR check 04/23/2015   INR from last check 3.50 (04/02/2015)   Weekly max dose    Target end date    [facility]   Preferred lab    Send INR reminders to       Comments         ANTICOAG TODAY: Anticoagulation Summary as of 04/02/2015    INR goal 2.5-3.5   Selected INR 3.50 (04/02/2015)   Next INR check 04/23/2015   Target end date     Anticoagulation Episode Summary    [facility]   Preferred lab    Send INR reminders to    Comments       PATIENT INSTRUCTIONS: Patient Instructions  Patient instructed to take medications as defined in the Anti-coagulation Track section of this encounter.  Patient instructed to take today's dose.  Patient verbalized understanding of these instructions.       FOLLOW-UP Return in 3 weeks (on 04/23/2015) for Follow up INR at 2:15PM.  [name], III Pharm.D., CACP

## 2015-04-02 NOTE — Progress Notes (Signed)
54 year old female with BMI>48, with right knee pain since months, likely osteoarthritis. Normal looking knee on exam, no tenderness to palpation. Seen and examined patient with Dr Posey Pronto, V. Ibuprofen, knee xray, weight loss counseling for now. Seems like the PCP Dr [name] [place] has been following the knee pain closely. Patient follows up with him on 04/26/15.

## 2015-04-04 ENCOUNTER — Other Ambulatory Visit: Payer: Self-pay

## 2015-04-04 ENCOUNTER — Telehealth: Payer: Self-pay | Admitting: *Deleted

## 2015-04-04 DIAGNOSIS — Z1231 Encounter for screening mammogram for malignant neoplasm of breast: Secondary | ICD-10-CM

## 2015-04-04 NOTE — Telephone Encounter (Signed)
Pt seen in [facility] on 04/02/2015 for knee pain and was rx'd ibuprofen 400mg  tabs.  Received faxed notice from pharmacy that patient has an increased risk of bleed because she is also on warfarin.  Pharmacy would like to know if it's ok to give pt this rx.  Will send to ordering MD for review. Please advise.Despina Hidden Cassady9/14/20162:37 PM

## 2015-04-05 NOTE — Progress Notes (Signed)
INTERNAL MEDICINE TEACHING ATTENDING ADDENDUM - Lalla Brothers M.D  Duration- Life long, Indication- venothromboembolism, INR- above goal. Agree with pharmacy recommendations as outlined in their note.

## 2015-04-06 ENCOUNTER — Encounter: Payer: Self-pay | Admitting: Internal Medicine

## 2015-04-23 ENCOUNTER — Ambulatory Visit (INDEPENDENT_AMBULATORY_CARE_PROVIDER_SITE_OTHER): Payer: Medicare Other | Admitting: Pharmacist

## 2015-04-23 ENCOUNTER — Ambulatory Visit
Admission: RE | Admit: 2015-04-23 | Discharge: 2015-04-23 | Disposition: A | Discharge status: Home / Self Care | Payer: Medicare Other | Source: Ambulatory Visit

## 2015-04-23 DIAGNOSIS — Z7901 Long term (current) use of anticoagulants: Secondary | ICD-10-CM | POA: Diagnosis not present

## 2015-04-23 DIAGNOSIS — Z954 Presence of other heart-valve replacement: Secondary | ICD-10-CM

## 2015-04-23 DIAGNOSIS — Z1231 Encounter for screening mammogram for malignant neoplasm of breast: Secondary | ICD-10-CM

## 2015-04-23 DIAGNOSIS — Z952 Presence of prosthetic heart valve: Secondary | ICD-10-CM

## 2015-04-23 LAB — POCT INR: INR: 3.7

## 2015-04-23 NOTE — Progress Notes (Signed)
Anti-Coagulation Progress Note  Breanna Wilkins is a 54 y.o. female who is currently on an anti-coagulation regimen.    RECENT RESULTS: Recent results are below, the most recent result is correlated with a dose of 32.5 mg. per week: Lab Results  Component Value Date   INR 3.7 04/23/2015   INR 3.50 04/02/2015   INR 2.30 03/12/2015    ANTI-COAG DOSE: Anticoagulation Dose Instructions as of 04/23/2015      [name] Tue Wed Thu Fri Sat   New Dose 5 mg 2.5 mg 5 mg 5 mg 2.5 mg 5 mg 5 mg       ANTICOAG SUMMARY: Anticoagulation Episode Summary    Current INR goal 2.5-3.5   Next INR check 05/14/2015   INR from last check 3.7! (04/23/2015)   Weekly max dose    Target end date    [facility]   Preferred lab    Send INR reminders to       Comments         ANTICOAG TODAY: Anticoagulation Summary as of 04/23/2015    INR goal 2.5-3.5   Selected INR 3.7! (04/23/2015)   Next INR check 05/14/2015   Target end date     Anticoagulation Episode Summary    [facility]   Preferred lab    Send INR reminders to    Comments       PATIENT INSTRUCTIONS: Patient Instructions  Patient instructed to take medications as defined in the Anti-coagulation Track section of this encounter.  Patient instructed to take today's dose.  Patient verbalized understanding of these instructions.        FOLLOW-UP Return in 3 weeks (on 05/14/2015) for Follow up INR at 2:30 pm.  Joya San, PharmD PGY1 Pharmacy Resident

## 2015-04-23 NOTE — Patient Instructions (Signed)
Patient instructed to take medications as defined in the Anti-coagulation Track section of this encounter.  Patient instructed to take today's dose.  Patient verbalized understanding of these instructions.    

## 2015-04-26 ENCOUNTER — Encounter: Payer: Self-pay | Admitting: Internal Medicine

## 2015-04-26 ENCOUNTER — Ambulatory Visit (INDEPENDENT_AMBULATORY_CARE_PROVIDER_SITE_OTHER): Payer: Medicare Other | Admitting: Internal Medicine

## 2015-04-26 VITALS — BP 140/81 | HR 66 | Temp 97.5°F | Ht 67.0 in | Wt 309.0 lb

## 2015-04-26 DIAGNOSIS — Z7901 Long term (current) use of anticoagulants: Secondary | ICD-10-CM

## 2015-04-26 LAB — POCT INR: INR: 3.4

## 2015-04-26 MED ORDER — PANTOPRAZOLE SODIUM 40 MG PO TBEC: DELAYED_RELEASE_TABLET | ORAL | Status: DC

## 2015-04-26 MED ORDER — LISINOPRIL 10 MG PO TABS: 10.0000 mg | ORAL_TABLET | Freq: Every day | ORAL | Status: DC

## 2015-04-26 NOTE — Progress Notes (Signed)
[name] presented for steroid knee injection which could not be completed previously due to elevated INR.  We checked INR again today and was elevated at 3.4.  I advised her if she wished to have the knee injection we would like to have the INR <2.5 (as close to 2 as possible) and I recommended she reschedule the appointment to another time of her choosing.    I feel that it is reasonable for her to hold the warfarin for 2 days prior to the appointment and resume after.  Will confirm this with Dr Elie Confer.  She also asked for refills of lisinopril and Protonix which I provided today.

## 2015-04-27 NOTE — Progress Notes (Signed)
INTERNAL MEDICINE TEACHING ATTENDING ADDENDUM - Syncere Kaminski M.D  Duration- indefinite, Indication- PE, DVT, INR- supra therapeutic. Agree with pharmacy recommendations as outlined in their note.      

## 2015-05-04 NOTE — Progress Notes (Signed)
Internal Medicine Clinic Attending  Case discussed with Dr. Hoffman soon after the resident saw the patient.  We reviewed the resident's history and exam and pertinent patient test results.  I agree with the assessment, diagnosis, and plan of care documented in the resident's note. 

## 2015-05-21 ENCOUNTER — Ambulatory Visit (INDEPENDENT_AMBULATORY_CARE_PROVIDER_SITE_OTHER): Payer: Medicare Other | Admitting: Pharmacist

## 2015-05-21 DIAGNOSIS — Z7901 Long term (current) use of anticoagulants: Secondary | ICD-10-CM

## 2015-05-21 DIAGNOSIS — Z86718 Personal history of other venous thrombosis and embolism: Secondary | ICD-10-CM | POA: Diagnosis not present

## 2015-05-21 LAB — POCT INR: INR: 3

## 2015-05-21 NOTE — Progress Notes (Signed)
Indication: Deep venous thrombosis. Duration: Lifelong per patient preference. INR: At target. Agree with Dr. Gladstone Pih assessment and plan.

## 2015-05-21 NOTE — Patient Instructions (Signed)
Patient instructed to take medications as defined in the Anti-coagulation Track section of this encounter.  Patient instructed to take today's dose.  Patient verbalized understanding of these instructions.    

## 2015-05-21 NOTE — Progress Notes (Signed)
Anti-Coagulation Progress Note  Breanna Wilkins is a 54 y.o. female who is currently on an anti-coagulation regimen.    RECENT RESULTS: Recent results are below, the most recent result is correlated with a dose of 30 mg. per week: Lab Results  Component Value Date   INR 3.00 05/21/2015   INR 3.4 04/26/2015   INR 3.7 04/23/2015    ANTI-COAG DOSE: Anticoagulation Dose Instructions as of 05/21/2015      [name] Tue Wed Thu Fri Sat   New Dose 5 mg 2.5 mg 5 mg 5 mg 2.5 mg 5 mg 5 mg       ANTICOAG SUMMARY: Anticoagulation Episode Summary    Current INR goal 2.5-3.5   Next INR check 06/18/2015   INR from last check 3.00 (05/21/2015)   Weekly max dose    Target end date    [facility]   Preferred lab    Send INR reminders to       Comments         ANTICOAG TODAY: Anticoagulation Summary as of 05/21/2015    INR goal 2.5-3.5   Selected INR 3.00 (05/21/2015)   Next INR check 06/18/2015   Target end date     Anticoagulation Episode Summary    [facility]   Preferred lab    Send INR reminders to    Comments       PATIENT INSTRUCTIONS: Patient Instructions  Patient instructed to take medications as defined in the Anti-coagulation Track section of this encounter.  Patient instructed to take today's dose.  Patient verbalized understanding of these instructions.       FOLLOW-UP Return in 4 weeks (on 06/18/2015) for Follow up INR at 2:15PM.  [name], III Pharm.D., CACP

## 2015-06-07 ENCOUNTER — Ambulatory Visit (INDEPENDENT_AMBULATORY_CARE_PROVIDER_SITE_OTHER): Payer: Medicare Other | Admitting: Internal Medicine

## 2015-06-07 ENCOUNTER — Encounter: Payer: Self-pay | Admitting: Internal Medicine

## 2015-06-07 VITALS — BP 141/70 | HR 73 | Temp 98.0°F | Ht 67.0 in | Wt 310.3 lb

## 2015-06-07 DIAGNOSIS — IMO0001 Reserved for inherently not codable concepts without codable children: Secondary | ICD-10-CM

## 2015-06-07 DIAGNOSIS — Z86718 Personal history of other venous thrombosis and embolism: Secondary | ICD-10-CM

## 2015-06-07 DIAGNOSIS — Z7901 Long term (current) use of anticoagulants: Secondary | ICD-10-CM | POA: Diagnosis not present

## 2015-06-07 DIAGNOSIS — Z6841 Body Mass Index (BMI) 40.0 and over, adult: Secondary | ICD-10-CM

## 2015-06-07 DIAGNOSIS — I10 Essential (primary) hypertension: Secondary | ICD-10-CM

## 2015-06-07 DIAGNOSIS — S46819D Strain of other muscles, fascia and tendons at shoulder and upper arm level, unspecified arm, subsequent encounter: Secondary | ICD-10-CM

## 2015-06-07 DIAGNOSIS — R7303 Prediabetes: Secondary | ICD-10-CM

## 2015-06-07 DIAGNOSIS — M75100 Unspecified rotator cuff tear or rupture of unspecified shoulder, not specified as traumatic: Secondary | ICD-10-CM

## 2015-06-07 DIAGNOSIS — M25561 Pain in right knee: Secondary | ICD-10-CM

## 2015-06-07 DIAGNOSIS — Z79891 Long term (current) use of opiate analgesic: Secondary | ICD-10-CM

## 2015-06-07 LAB — POCT GLYCOSYLATED HEMOGLOBIN (HGB A1C): HEMOGLOBIN A1C: 6

## 2015-06-07 LAB — POCT INR: INR: 4

## 2015-06-07 LAB — GLUCOSE, CAPILLARY: Glucose-Capillary: 90 mg/dL (ref 65–99)

## 2015-06-07 MED ORDER — HYDROCODONE-ACETAMINOPHEN 10-325 MG PO TABS: 1.0000 | ORAL_TABLET | Freq: Four times a day (QID) | ORAL | Status: DC | PRN

## 2015-06-07 MED ORDER — WARFARIN SODIUM 5 MG PO TABS: 5.0000 mg | ORAL_TABLET | Freq: Every day | ORAL | Status: DC

## 2015-06-07 NOTE — Patient Instructions (Signed)
General Instructions: I want you to stop taking Warfarin for 3 days prior to your next appointment so that we can do a steroid injection to your knee.  Please bring your medicines with you each time you come to clinic.  Medicines may include prescription medications, over-the-counter medications, herbal remedies, eye drops, vitamins, or other pills.   Progress Toward Treatment Goals:  Treatment Goal 05/29/2014  Blood pressure at goal    Self Care Goals & Plans:  Self Care Goal 04/26/2015  Manage my medications take my medicines as prescribed; bring my medications to every visit; refill my medications on time  Monitor my health keep track of my blood pressure  Eat healthy foods drink diet soda or water instead of juice or soda; eat more vegetables; eat foods that are low in salt; eat baked foods instead of fried foods  Be physically active find an activity I enjoy  Meeting treatment goals -    No flowsheet data found.   Care Management & Community Referrals:  Referral 05/29/2014  Referrals made for care management support none needed  Referrals made to community resources none

## 2015-06-10 NOTE — Progress Notes (Signed)
[facility] Subjective:   Patient ID: Breanna Wilkins female   DOB: Dec 16, 1960 54 y.o.   MRN: [id number]  HPI: BreannaDynisha D Wilkins is a 54 y.o. female with a PMH detailed below who presents for routine follow up as well as a steroid injection of her knee. Please see A&P below for further details of her chronic medical problems.    Past Medical History  Diagnosis Date  . Shoulder pain   . Pulmonary embolus ([place]) 01/16/10    on Coumadin indefinitely  . Abdominal hernia   . Hypertension   . Overweight(278.02)   . Lung nodule 12/2009    Very small, left upper lobe by CT June, 2011, one-year followup was stable  . Shortness of breath   . Warfarin anticoagulation   . Ejection fraction     65-70%, vigorous function, echo, March, 2011  . CHF (congestive heart failure) ([place])   . OSA (obstructive sleep apnea)     CPAP  . CAD (coronary artery disease)     Perioperative non-STEMI March, 2014  //   cardiac catheterization at time of non-STEMI, March, 2014, minor nonobstructive coronary disease, vigorous LV function, possibly vasospastic event versus stress cardiomyopathy. No further workup of coronary disease  . Ejection fraction     Vigorous function at time of catheterization October 06, 2012,   Current Outpatient Prescriptions  Medication Sig Dispense Refill  . aspirin 81 MG chewable tablet Chew 1 tablet (81 mg total) by mouth daily. 30 tablet 1  . BOOSTRIX 5-2.5-18.5 injection     . carvedilol (COREG) 6.25 MG tablet Take 1 tablet (6.25 mg total) by mouth 2 (two) times daily with a meal. 180 tablet 3  . Cyanocobalamin (VITAMIN B 12 PO) Take by mouth.    . furosemide (LASIX) 80 MG tablet Take 1 tablet (80 mg total) by mouth daily. 90 tablet 3  . HYDROcodone-acetaminophen (NORCO) 10-325 MG tablet Take 1 tablet by mouth every 6 (six) hours as needed. 90 tablet 0  . ibuprofen (ADVIL,MOTRIN) 400 MG tablet Take 1 tablet (400 mg total) by mouth every 8 (eight) hours as  needed for moderate pain. 90 tablet 0  . lisinopril (PRINIVIL,ZESTRIL) 10 MG tablet Take 1 tablet (10 mg total) by mouth daily. 90 tablet 3  . pantoprazole (PROTONIX) 40 MG tablet TAKE 1 TABLET BY MOUTH 30 MINUTES BEFORE LUNCH OR DINNER 90 tablet 3  . rosuvastatin (CRESTOR) 5 MG tablet Take 1 tablet (5 mg total) by mouth at bedtime. 90 tablet 3  . warfarin (COUMADIN) 5 MG tablet Take 1 tablet (5 mg total) by mouth daily at 6 PM. 31 tablet 2   No current facility-administered medications for this visit.   Family History  Problem Relation Age of Onset  . Diabetes Father   . Diabetes Sister   . Diabetes Mother   . Deep vein thrombosis Sister   . Ovarian cancer Mother   . Bone cancer Maternal Grandmother    Social History   Social History  . Marital Status: Single    Spouse Name: N/A  . Number of Children: N/A  . Years of Education: N/A   Social History Main Topics  . Smoking status: Former Research scientist (life sciences)  . Smokeless tobacco: None     Comment: qoit 12 years ago  . Alcohol Use: 3.6 oz/week    0 Standard drinks or equivalent, 6 Cans of beer per week     Comment: Beer sometimes.  . Drug Use: No  Comment: smoked joint 2 weeks ago  . Sexual Activity: Not Asked   Other Topics Concern  . None   Social History Narrative   Financial assistance approved for 100% discount at [facility] and has [facility] card per Bonna Gains   12/25/2009   Review of Systems: Review of Systems  Constitutional: Negative for fever and chills.  Respiratory: Negative for cough and shortness of breath.   Cardiovascular: Negative for chest pain and leg swelling.  Gastrointestinal: Negative for heartburn.  Genitourinary: Negative for dysuria.  Musculoskeletal: Positive for joint pain. Negative for falls.  Neurological: Negative for dizziness.  Psychiatric/Behavioral: Negative for substance abuse.     Objective:  Physical Exam: Filed Vitals:   06/07/15 1427  BP: 141/70  Pulse: 73  Temp: 98 F (36.7 C)  TempSrc:  Oral  Height: 5\' 7"  (1.702 m)  Weight: 310 lb 4.8 oz (140.751 kg)  SpO2: 98%   Physical Exam  Constitutional: She is well-developed, well-nourished, and in no distress.  Cardiovascular: Normal rate and regular rhythm.   Pulmonary/Chest: Effort normal and breath sounds normal.  Abdominal: Soft. Bowel sounds are normal.  Musculoskeletal:  Mild tenderness at medial and lateral joint line of right knee, mild effusion, no ligamentus laxity  Nursing note and vitals reviewed.   Assessment & Plan:  Case discussed with Dr. Dareen Piano  Essential hypertension, benign BP roughly at goal.  Continue lisinopril, coreg and lasix.  Long-term (current) use of anticoagulants Checked INR and it returned at 4.  I have reiterated the plan that she needs to hold off coumadin for 3 days for the knee injection. In further review of her history I am not sure if she needs to at a goal INR of 2.5-3.5 (she apparently developed a DVT while theraputic after a meniscal repair surgery.  If she is interested she would likely be a candidate for eliquis at the 5mg  BID or possibly 2.5mg  BID dosage.  She wants to talk to Dr Elie Confer about these options.  BMI 45.0-49.9, adult ([place]) I have discussed the importance of weight loss for her overall health.  She is willing to see our dietician and I have placed a referral  Prediabetes I screened her for diabetes today and her A1c of 6% suggesting prediabetes.  I have discussed with her the importance of weight loss to prevent the development of diabetes.  -Referral to MNT  Right knee pain She continues to have right knee pain which I suspect is OA due to her weight.  I have discussed weight loss and low impact exercise. I she will schedule a follow up appointment with me for a steroid injection but will need an INR <2.  Supraspinatus tendon tear She has been on chronic narcotic therapy for her supraspinatus tendon tear as she wants to potential surgical correction.  Due to her  increased pain in her knee she has been taking the Norco more frequently.  She usually takes about 3 pills a day.  I would like to potentially use steroid injections to decrease her pain and thus her narcotics use. Given 3 months Rx #90.    Medications Ordered Meds ordered this encounter  Medications  . DISCONTD: HYDROcodone-acetaminophen (NORCO) 10-325 MG tablet    Sig: Take 1 tablet by mouth every 6 (six) hours as needed.    Dispense:  90 tablet    Refill:  0    Rx 1/3  . DISCONTD: HYDROcodone-acetaminophen (NORCO) 10-325 MG tablet    Sig: Take 1 tablet by mouth  every 6 (six) hours as needed.    Dispense:  90 tablet    Refill:  0    Rx 2/3 TO be filled 30 days after print date  . HYDROcodone-acetaminophen (NORCO) 10-325 MG tablet    Sig: Take 1 tablet by mouth every 6 (six) hours as needed.    Dispense:  90 tablet    Refill:  0    Rx 3/3 TO be filled 60 days after print date  . warfarin (COUMADIN) 5 MG tablet    Sig: Take 1 tablet (5 mg total) by mouth daily at 6 PM.    Dispense:  31 tablet    Refill:  2   Other Orders Orders Placed This Encounter  Procedures  . Glucose, capillary  . Amb ref to Medical Nutrition Therapy-MNT    Referral Priority:  Routine    Referral Type:  Consultation    Referral Reason:  Specialty Services Required    Requested Specialty:  Nutrition    Number of Visits Requested:  1  . POCT INR  . POC Hbg A1C   Follow Up: Return in about 3 weeks (around 06/28/2015).

## 2015-06-10 NOTE — Assessment & Plan Note (Signed)
I have discussed the importance of weight loss for her overall health.  She is willing to see our dietician and I have placed a referral

## 2015-06-10 NOTE — Assessment & Plan Note (Signed)
Checked INR and it returned at 4.  I have reiterated the plan that she needs to hold off coumadin for 3 days for the knee injection. In further review of her history I am not sure if she needs to at a goal INR of 2.5-3.5 (she apparently developed a DVT while theraputic after a meniscal repair surgery.  If she is interested she would likely be a candidate for eliquis at the 5mg  BID or possibly 2.5mg  BID dosage.  She wants to talk to Dr Elie Confer about these options.

## 2015-06-10 NOTE — Assessment & Plan Note (Signed)
BP roughly at goal.  Continue lisinopril, coreg and lasix.

## 2015-06-10 NOTE — Assessment & Plan Note (Signed)
She has been on chronic narcotic therapy for her supraspinatus tendon tear as she wants to potential surgical correction.  Due to her increased pain in her knee she has been taking the Norco more frequently.  She usually takes about 3 pills a day.  I would like to potentially use steroid injections to decrease her pain and thus her narcotics use. Given 3 months Rx #90.

## 2015-06-10 NOTE — Assessment & Plan Note (Signed)
She continues to have right knee pain which I suspect is OA due to her weight.  I have discussed weight loss and low impact exercise. I she will schedule a follow up appointment with me for a steroid injection but will need an INR <2.

## 2015-06-10 NOTE — Assessment & Plan Note (Signed)
I screened her for diabetes today and her A1c of 6% suggesting prediabetes.  I have discussed with her the importance of weight loss to prevent the development of diabetes.  -Referral to MNT

## 2015-06-11 NOTE — Progress Notes (Signed)
Internal Medicine Clinic Attending  Case discussed with Dr. Hoffman soon after the resident saw the patient.  We reviewed the resident's history and exam and pertinent patient test results.  I agree with the assessment, diagnosis, and plan of care documented in the resident's note. 

## 2015-06-18 ENCOUNTER — Ambulatory Visit (INDEPENDENT_AMBULATORY_CARE_PROVIDER_SITE_OTHER): Payer: Medicare Other | Admitting: Pharmacist

## 2015-06-18 DIAGNOSIS — Z86718 Personal history of other venous thrombosis and embolism: Secondary | ICD-10-CM | POA: Diagnosis not present

## 2015-06-18 DIAGNOSIS — Z7901 Long term (current) use of anticoagulants: Secondary | ICD-10-CM

## 2015-06-18 LAB — POCT INR: INR: 3.6

## 2015-06-19 NOTE — Patient Instructions (Signed)
Patient instructed to take medications as defined in the Anti-coagulation Track section of this encounter.  Patient instructed to take today's dose.  Patient instructed to discontinue warfarin after her dose on June 23, 2015 in advance of her joint aspiration to be performed by Dr. [name] [place] on June 28, 2015. After procedure, RESUME warfarin with the current instructions.  Patient verbalized understanding of these instructions.

## 2015-06-19 NOTE — Progress Notes (Signed)
Anti-Coagulation Progress Note  Breanna Wilkins is a 54 y.o. female who is currently on an anti-coagulation regimen.    RECENT RESULTS: Recent results are below, the most recent result is correlated with a dose of 30 mg. per week: Lab Results  Component Value Date   INR 3.60 06/18/2015   INR 4.0 06/07/2015   INR 3.00 05/21/2015    ANTI-COAG DOSE: Anticoagulation Dose Instructions as of 06/18/2015      [name] Tue Wed Thu Fri Sat   New Dose 5 mg 2.5 mg 5 mg 5 mg 2.5 mg 5 mg 5 mg       ANTICOAG SUMMARY: Anticoagulation Episode Summary    Current INR goal 2.5-3.5   Next INR check 07/19/2015   INR from last check 3.60! (06/18/2015)   Weekly max dose    Target end date    [facility]   Preferred lab    Send INR reminders to       Comments         ANTICOAG TODAY: Anticoagulation Summary as of 06/18/2015    INR goal 2.5-3.5   Selected INR 3.60! (06/18/2015)   Next INR check 07/19/2015   Target end date     Anticoagulation Episode Summary    [facility]   Preferred lab    Send INR reminders to    Comments       PATIENT INSTRUCTIONS: Patient Instructions  Patient instructed to take medications as defined in the Anti-coagulation Track section of this encounter.  Patient instructed to take today's dose.  Patient instructed to discontinue warfarin after her dose on June 23, 2015 in advance of her joint aspiration to be performed by Dr. [name] [place] on June 28, 2015. After procedure, RESUME warfarin with the current instructions.  Patient verbalized understanding of these instructions.       FOLLOW-UP Return in 4 weeks (on 07/19/2015) for Follow up INR at 2:30PM Dr. Elie Confer aware of day/date/time.[name], III Pharm.D., CACP

## 2015-06-28 ENCOUNTER — Ambulatory Visit (INDEPENDENT_AMBULATORY_CARE_PROVIDER_SITE_OTHER): Payer: Medicare Other | Admitting: Internal Medicine

## 2015-06-28 ENCOUNTER — Encounter: Payer: Self-pay | Admitting: Internal Medicine

## 2015-06-28 VITALS — BP 157/75 | HR 76 | Temp 97.8°F | Ht 67.0 in | Wt 313.1 lb

## 2015-06-28 DIAGNOSIS — Z6841 Body Mass Index (BMI) 40.0 and over, adult: Secondary | ICD-10-CM

## 2015-06-28 DIAGNOSIS — M25561 Pain in right knee: Secondary | ICD-10-CM | POA: Diagnosis not present

## 2015-06-28 DIAGNOSIS — Z86718 Personal history of other venous thrombosis and embolism: Secondary | ICD-10-CM | POA: Diagnosis not present

## 2015-06-28 DIAGNOSIS — Z86711 Personal history of pulmonary embolism: Secondary | ICD-10-CM | POA: Diagnosis not present

## 2015-06-28 DIAGNOSIS — Z7901 Long term (current) use of anticoagulants: Secondary | ICD-10-CM

## 2015-06-28 LAB — POCT INR: INR: 1.2

## 2015-06-28 MED ORDER — RIVAROXABAN 20 MG PO TABS: 20.0000 mg | ORAL_TABLET | Freq: Every day | ORAL | Status: DC

## 2015-06-28 MED ORDER — DIPHENHYDRAMINE-ZINC ACETATE 2-0.1 % EX CREA: 1.0000 "application " | TOPICAL_CREAM | Freq: Three times a day (TID) | CUTANEOUS | Status: DC | PRN

## 2015-06-28 NOTE — Patient Instructions (Addendum)
General Instructions: Stop taking Warfarin and Stat taking Xarelto as we discussed.  Please let me know if you have problems.  Please bring your medicines with you each time you come to clinic.  Medicines may include prescription medications, over-the-counter medications, herbal remedies, eye drops, vitamins, or other pills.   Progress Toward Treatment Goals:  Treatment Goal 05/29/2014  Blood pressure at goal    Self Care Goals & Plans:  Self Care Goal 06/28/2015  Manage my medications take my medicines as prescribed; bring my medications to every visit; refill my medications on time  Monitor my health -  Eat healthy foods eat more vegetables; eat foods that are low in salt; eat baked foods instead of fried foods  Be physically active find an activity I enjoy  Meeting treatment goals -    No flowsheet data found.   Care Management & Community Referrals:  Referral 05/29/2014  Referrals made for care management support none needed  Referrals made to community resources none       Knee Injection A knee injection is a procedure to get medicine into your knee joint. Your health care provider puts a needle into the joint and injects medicine with an attached syringe. The injected medicine may relieve the pain, swelling, and stiffness of arthritis. The injected medicine may also help to lubricate and cushion your knee joint. You may need more than one injection. LET [facility] CARE PROVIDER KNOW ABOUT:  Any allergies you have.  All medicines you are taking, including vitamins, herbs, eye drops, creams, and over-the-counter medicines.  Previous problems you or members of your family have had with the use of anesthetics.  Any blood disorders you have.  Previous surgeries you have had.  Any medical conditions you may have. RISKS AND COMPLICATIONS Generally, this is a safe procedure. However, problems may occur, including:  Infection.  Bleeding.  Worsening  symptoms.  Damage to the area around your knee.  Allergic reaction to any of the medicines.  Skin reactions from repeated injections. BEFORE THE PROCEDURE  Ask your health care provider about changing or stopping your regular medicines. This is especially important if you are taking diabetes medicines or blood thinners.  Plan to have someone take you home after the procedure. PROCEDURE  You will sit or lie down in a position for your knee to be treated.  The skin over your kneecap will be cleaned with a germ-killing solution (antiseptic).  You will be given a medicine that numbs the area (local anesthetic). You may feel some stinging.  After your knee becomes numb, you will have a second injection. This is the medicine. This needle is carefully placed between your kneecap and your knee. The medicine is injected into the joint space.  At the end of the procedure, the needle will be removed.  A bandage (dressing) may be placed over the injection site. The procedure may vary among health care providers and hospitals. AFTER THE PROCEDURE  You may have to move your knee through its full range of motion. This helps to get all of the medicine into your joint space.  Your blood pressure, heart rate, breathing rate, and blood oxygen level will be monitored often until the medicines you were given have worn off.  You will be watched to make sure that you do not have a reaction to the injected medicine.   This information is not intended to replace advice given to you by your health care provider. Make sure you discuss any  questions you have with your health care provider.   Document Released: 09/28/2006 Document Revised: 07/28/2014 Document Reviewed: 05/17/2014 Elsevier Interactive Patient Education [employer].

## 2015-06-28 NOTE — Progress Notes (Signed)
[facility] Subjective:   Patient ID: Breanna Wilkins female   DOB: 29-Nov-1960 54 y.o.   MRN: [id number]  HPI: Breanna Wilkins is a 54 y.o. female with a PMH detailed below who presents for follow up of her right knee pain.  She has suspect OA of the right knee due to her morbid obesity, this has limited her functional ability and she has not been able to get relief with tylenol and low dose NSAID.  She is on chronic A/C due to history of DVT and PE and a subsequent DVT while after meniscus surgery while A/C on warfarin.  She currently has an INR goal of 2.5-3.5 but I had her stop her A/C 5 days prior so that we could preform a right knee steroid injection today.    Past Medical History  Diagnosis Date  . Shoulder pain   . Pulmonary embolus ([place]) 01/16/10    on Coumadin indefinitely  . Abdominal hernia   . Hypertension   . Overweight(278.02)   . Lung nodule 12/2009    Very small, left upper lobe by CT June, 2011, one-year followup was stable  . Shortness of breath   . Warfarin anticoagulation   . Ejection fraction     65-70%, vigorous function, echo, March, 2011  . CHF (congestive heart failure) ([place])   . OSA (obstructive sleep apnea)     CPAP  . CAD (coronary artery disease)     Perioperative non-STEMI March, 2014  //   cardiac catheterization at time of non-STEMI, March, 2014, minor nonobstructive coronary disease, vigorous LV function, possibly vasospastic event versus stress cardiomyopathy. No further workup of coronary disease  . Ejection fraction     Vigorous function at time of catheterization October 06, 2012,   Current Outpatient Prescriptions  Medication Sig Dispense Refill  . aspirin 81 MG chewable tablet Chew 1 tablet (81 mg total) by mouth daily. 30 tablet 1  . BOOSTRIX 5-2.5-18.5 injection     . carvedilol (COREG) 6.25 MG tablet Take 1 tablet (6.25 mg total) by mouth 2 (two) times daily with a meal. 180 tablet 3  . Cyanocobalamin (VITAMIN  B 12 PO) Take by mouth.    . furosemide (LASIX) 80 MG tablet Take 1 tablet (80 mg total) by mouth daily. 90 tablet 3  . HYDROcodone-acetaminophen (NORCO) 10-325 MG tablet Take 1 tablet by mouth every 6 (six) hours as needed. 90 tablet 0  . ibuprofen (ADVIL,MOTRIN) 400 MG tablet Take 1 tablet (400 mg total) by mouth every 8 (eight) hours as needed for moderate pain. 90 tablet 0  . lisinopril (PRINIVIL,ZESTRIL) 10 MG tablet Take 1 tablet (10 mg total) by mouth daily. 90 tablet 3  . pantoprazole (PROTONIX) 40 MG tablet TAKE 1 TABLET BY MOUTH 30 MINUTES BEFORE LUNCH OR DINNER 90 tablet 3  . rosuvastatin (CRESTOR) 5 MG tablet Take 1 tablet (5 mg total) by mouth at bedtime. 90 tablet 3  . warfarin (COUMADIN) 5 MG tablet Take 1 tablet (5 mg total) by mouth daily at 6 PM. 31 tablet 2   No current facility-administered medications for this visit.   Family History  Problem Relation Age of Onset  . Diabetes Father   . Diabetes Sister   . Diabetes Mother   . Deep vein thrombosis Sister   . Ovarian cancer Mother   . Bone cancer Maternal Grandmother    Social History   Social History  . Marital Status: Single    Spouse  Name: N/A  . Number of Children: N/A  . Years of Education: N/A   Social History Main Topics  . Smoking status: Former Research scientist (life sciences)  . Smokeless tobacco: None     Comment: qoit 12 years ago  . Alcohol Use: 3.6 oz/week    0 Standard drinks or equivalent, 6 Cans of beer per week     Comment: Beer sometimes.  . Drug Use: No     Comment: smoked joint 2 weeks ago  . Sexual Activity: Not Asked   Other Topics Concern  . None   Social History Narrative   Financial assistance approved for 100% discount at [facility] and has [facility] card per Bonna Gains   12/25/2009   Review of Systems: Review of Systems  Constitutional: Negative for fever.  Eyes: Negative for blurred vision.  Respiratory: Negative for cough, hemoptysis, sputum production, shortness of breath and wheezing.   Cardiovascular:  Negative for chest pain and leg swelling.  Gastrointestinal: Negative for heartburn and abdominal pain.  Musculoskeletal: Positive for joint pain. Negative for falls.     Objective:  Physical Exam: Filed Vitals:   06/28/15 1435  BP: 157/75  Pulse: 76  Temp: 97.8 F (36.6 C)  TempSrc: Oral  Height: 5\' 7"  (1.702 m)  Weight: 313 lb 1.6 oz (142.021 kg)  SpO2: 100%  Physical Exam  Constitutional: No distress.  obese  HENT:  Head: Normocephalic and atraumatic.  Pulmonary/Chest: Effort normal and breath sounds normal.  Musculoskeletal: She exhibits no edema.  Right knee: mild effusion, tenderness on medial tibial plateau.  No ligamentous laxity, No warmth or errythema  Nursing note and vitals reviewed.   Assessment & Plan:  Case discussed with Dr. Daryll Drown  Right knee pain I suspect she has OA of her right knee but she has not been able to have enough relief to start an aquatics exercise program.  We discussed the risks and benefits of a steroid injection and she agreed. I infected 40mg  of kenelog to her right knee. - I have instructed her to take things easy for the next 3 days then to start up her exercise program.  Long-term (current) use of anticoagulants - INR checked and was 1.2 >> proceeded with knee injection I did take this opportunity to discuss with her in detail the DOAC/NOAC medications and there indications for the prevention of DVT/PE.  We discussed that she was a candidate for Eliquis or Xarelto and that they have been shown to be as effective as warfain in preventing blood clots with LESS life threatening bleeding.  I asked Dr Elie Confer to also come in and he helped answer her questions about these new medications, that there is no need for laboratory monitoring for effectiveness and the lack of a current reversal agent. Katharine Look, Dr Elie Confer and I all felt that she would be a good candidate to change and Kadesia prefers the once daily Xarelto. -Will start her on once daily  Xarelto 20mg .  She will take first dose tonight.  IF she has problems obtaining the medications she will need to contact myself or Dr Elie Confer for alternative strategy.    Medications Ordered Meds ordered this encounter  Medications  . rivaroxaban (XARELTO) 20 MG TABS tablet    Sig: Take 1 tablet (20 mg total) by mouth daily with supper.    Dispense:  30 tablet    Refill:  11    D/C Warfarin  . diphenhydrAMINE-zinc acetate (BENADRYL EXTRA STRENGTH) cream    Sig: Apply 1  application topically 3 (three) times daily as needed for itching.    Dispense:  28.4 g    Refill:  0   Other Orders Orders Placed This Encounter  Procedures  . POCT INR   Follow Up: Return in about 3 months (around 09/26/2015), or if symptoms worsen or fail to improve.  Procedure PROCEDURE NOTE  PROCEDURE: right knee joint steroid injection.  PREOPERATIVE DIAGNOSIS: Osteoarthritis of the right knee.  POSTOPERATIVE DIAGNOSIS: Osteoarthritis of the right knee.  PROCEDURE: The patient was apprised of the risks and the benefits of the procedure and informed consent was obtained, as witnessed by Dr Daryll Drown. Time-out procedure was performed, with confirmation of the patient's name, date of birth, and correct identification of the right knee to be injected. The patient's knee was then marked at the appropriate site for injection placement. The knee was sterilely prepped with Betadine. A 40mg  mg (1 milliliter) solution of Kenalog was drawn up into a 2.5 mL syringe with a 1.5 mL of 1% lidocaine. The patient was injected with a 25-gauge needle at the medial aspect of her right flexed knee. There were no complications. The patient tolerated the procedure well. There was minimal bleeding. The patient was instructed to ice her knee upon leaving clinic and refrain from overuse over the next 3 days. The patient was instructed to go to the emergency room with any usual pain, swelling, or redness occurred in the injected area. The patient  was given a followup appointment to evaluate response to the injection to his increased range of motion and reduction of pain.  The procedure was supervised by attending physician, Dr. Daryll Drown.

## 2015-06-29 NOTE — Assessment & Plan Note (Signed)
I suspect she has OA of her right knee but she has not been able to have enough relief to start an aquatics exercise program.  We discussed the risks and benefits of a steroid injection and she agreed. I infected 40mg  of kenelog to her right knee. - I have instructed her to take things easy for the next 3 days then to start up her exercise program.

## 2015-06-29 NOTE — Assessment & Plan Note (Signed)
-   INR checked and was 1.2 >> proceeded with knee injection I did take this opportunity to discuss with her in detail the DOAC/NOAC medications and there indications for the prevention of DVT/PE.  We discussed that she was a candidate for Eliquis or Xarelto and that they have been shown to be as effective as warfain in preventing blood clots with LESS life threatening bleeding.  I asked Dr Elie Confer to also come in and he helped answer her questions about these new medications, that there is no need for laboratory monitoring for effectiveness and the lack of a current reversal agent. Breanna Wilkins, Dr Elie Confer and I all felt that she would be a good candidate to change and Breanna Wilkins prefers the once daily Xarelto. -Will start her on once daily Xarelto 20mg .  She will take first dose tonight.  IF she has problems obtaining the medications she will need to contact myself or Dr Elie Confer for alternative strategy.

## 2015-07-04 ENCOUNTER — Ambulatory Visit (INDEPENDENT_AMBULATORY_CARE_PROVIDER_SITE_OTHER): Payer: Medicare Other | Admitting: Dietician

## 2015-07-04 DIAGNOSIS — Z6841 Body Mass Index (BMI) 40.0 and over, adult: Secondary | ICD-10-CM

## 2015-07-04 DIAGNOSIS — Z713 Dietary counseling and surveillance: Secondary | ICD-10-CM | POA: Diagnosis not present

## 2015-07-04 NOTE — Progress Notes (Signed)
Internal Medicine Clinic Attending  I saw and evaluated the patient.  I personally confirmed the key portions of the history and exam documented by Dr. Hoffman and I reviewed pertinent patient test results.  The assessment, diagnosis, and plan were formulated together and I agree with the documentation in the resident's note.      

## 2015-07-04 NOTE — Progress Notes (Signed)
  Medical Nutrition Therapy:  Appt start time: 1000 end time:  1100. Visit # 1  Assessment:  Primary concerns today: weight loss, primarily nutrition education.  Patient requests assistance with weight loss. She is motivated that her knee pain and heart condition may improve with weight loss. She thinks her portion sizes are her main problem. She had been avoiding vegetables because of being on coumadin, but has recently been taken off so is looking forward to adding greens back to her diet. She reports no previous meal planning education Preferred Learning Style: No preference indicated  Learning Readiness: Ready, Change in progress in regards to increasing activity  ANTHROPOMETRICS: weight: 313#, height: 67", BMI: 49- class III obesity WEIGHT HISTORY: was ~280 6 years ago, BMI 43 PHYSICAL ACTIVITY: got an injection in her right knee that helped  and joined the [employer], plans to walk the pool and bike, asks for guidance on how long she should do these activities SLEEP:not addressed today MEDICATIONS: crestor, B12 po daily DIETARY INTAKE: Usual eating pattern includes 3 meals and unknown snacks per day. Everyday foods include starches, .  Avoided foods include fried foods as much as possible.   24-hr recall:  B ( 930 AM): sandwich or grits and eggs L ( 2 PM): chinese take out, fried rice,  D ( 7-8PM): eats half a plate of mac and cheese, potato and macaroni salad, baked meat Snk ( PM):  Beverages: 30-40 oz sugar free koolaid per day, water, occasional beer- 1-2 at a time, regular soda and half lemonade and tea   Estimated daily energy needs: 1450-1950 calories for 1 # per week weight loss   Progress Towards Goal(s):  In progress.   Nutritional Diagnosis:  NB-1.1 Food and nutrition-related knowledge deficit As related to lack of sufficient prior meal planning education.  As evidenced by her report, questions and stated lcak of knowledge. .    Intervention:  Nutrition education about meal  planning for weight loss, included the plate method, recommendation for activity prescription, self monitoring. Coordination of care: consider weight loss medication or weight loss surgery if weight loss by lifestyle unsuccessful  Teaching Method Utilized: Visual, Auditory,  Hands on Handouts given during visit include: Plate method, AVS Barriers to learning/adherence to lifestyle change: support, knee pain, lack of insurance coverage for prediabetes/weight loss Demonstrated degree of understanding via:  Teach Back   Monitoring/Evaluation:  Dietary intake, exercise, and body weight in 2 week(s).

## 2015-07-04 NOTE — Patient Instructions (Addendum)
follow up appointment IN 2 weeks  Weigh yourself next Wednesday, DECEMBER 21ST,  and write it down here: _______ #                         AND THE FOLLOWING Wednesday December 28TH AND WRITE IT DOWN HERE: ____________#  Eat more vegetables than starches- REMEMBER THE DIVIDED PLATE   WHAT I SHOULD DRINK:  WATER, MILK- 2%, LEMON WATER HALF TEA AND HALF LEMON SHOULD BE SUGAR FREE SUGAR FREE KOOL AID ONE LIGHT BEER PER DAY IS RECOMMENDED LIMIT FOR WOMEN  YOUR WEIGHTGOAL OF 250-260# MAY TAKE YOU UNTIL  8-10 MONTHS   TO REACH.

## 2015-07-05 NOTE — Progress Notes (Signed)
I have reviewed Dr. Gladstone Pih note.  Patient is on [facility] for VTE, requests to be on lifelong.  She is holding medication for procedure as noted in Dr. Gladstone Pih note.

## 2015-07-17 ENCOUNTER — Ambulatory Visit (INDEPENDENT_AMBULATORY_CARE_PROVIDER_SITE_OTHER): Payer: Medicare Other | Admitting: Internal Medicine

## 2015-07-17 ENCOUNTER — Encounter: Payer: Self-pay | Admitting: Internal Medicine

## 2015-07-17 VITALS — BP 123/61 | HR 77 | Temp 97.5°F | Ht 67.0 in | Wt 308.6 lb

## 2015-07-17 DIAGNOSIS — I2699 Other pulmonary embolism without acute cor pulmonale: Secondary | ICD-10-CM | POA: Diagnosis not present

## 2015-07-17 DIAGNOSIS — I5032 Chronic diastolic (congestive) heart failure: Secondary | ICD-10-CM | POA: Diagnosis not present

## 2015-07-17 MED ORDER — CYCLOBENZAPRINE HCL 7.5 MG PO TABS: 7.5000 mg | ORAL_TABLET | Freq: Three times a day (TID) | ORAL | Status: DC | PRN

## 2015-07-17 NOTE — Assessment & Plan Note (Addendum)
She wants to switch to Warfarin. She says she will prefer a medication that is monitored as against Evalina Field. She thinks the flank pain is related to the xalreto. She also had one episode of blood on her tissue after passing urine. She has not taken her xalreto in 2 days.  Plan- Refered to clinic pharmacist to recommence warfarin - Follow up with INR - D/c Evalina Field - Information about meals high in Vit k given to pt.

## 2015-07-17 NOTE — Progress Notes (Signed)
Patient ID: Breanna Wilkins, female   DOB: 09/16/1960, 54 y.o.   MRN: [id number]   Subjective:   Patient ID: Breanna Wilkins female   DOB: 1961/06/08 54 y.o.   MRN: [id number]  HPI: [name] is a 54 y.o. with significant PMH listed. Presented today with right sided flank pain that started about the 14th of dec, about 5 days after starting xalreto. Pt says when she put pressure on it the pain eases and also when she walks, she has no pain. Pain is intermittent, present when she sits down, and area is without redness or swelling. She is walking on weightloss, she is concentraing on diet now, and has not started any vigorius exercise, no heavy lifting. Pain as desrcibes as warm and as an ache. She feels it radiated abit anteriorly. Not related to breathing. She says on christmas day she had a little blood after passing urine when she wiped, last menstral period was about 79yrs ago, she has had a hysterectiomy for fibroids. She denies any pain with urination, passes urine about 2 times at night which is normal for her.  Past Medical History  Diagnosis Date  . Shoulder pain   . Pulmonary embolus ([place]) 01/16/10    on Coumadin indefinitely  . Abdominal hernia   . Hypertension   . Overweight(278.02)   . Lung nodule 12/2009    Very small, left upper lobe by CT June, 2011, one-year followup was stable  . Shortness of breath   . Warfarin anticoagulation   . Ejection fraction     65-70%, vigorous function, echo, March, 2011  . CHF (congestive heart failure) ([place])   . OSA (obstructive sleep apnea)     CPAP  . CAD (coronary artery disease)     Perioperative non-STEMI March, 2014  //   cardiac catheterization at time of non-STEMI, March, 2014, minor nonobstructive coronary disease, vigorous LV function, possibly vasospastic event versus stress cardiomyopathy. No further workup of coronary disease  . Ejection fraction     Vigorous function at time of catheterization October 06, 2012,    Current Outpatient Prescriptions  Medication Sig Dispense Refill  . aspirin 81 MG chewable tablet Chew 1 tablet (81 mg total) by mouth daily. 30 tablet 1  . BOOSTRIX 5-2.5-18.5 injection     . carvedilol (COREG) 6.25 MG tablet Take 1 tablet (6.25 mg total) by mouth 2 (two) times daily with a meal. 180 tablet 3  . Cyanocobalamin (VITAMIN B 12 PO) Take by mouth.    . diphenhydrAMINE-zinc acetate (BENADRYL EXTRA STRENGTH) cream Apply 1 application topically 3 (three) times daily as needed for itching. 28.4 g 0  . furosemide (LASIX) 80 MG tablet Take 1 tablet (80 mg total) by mouth daily. 90 tablet 3  . HYDROcodone-acetaminophen (NORCO) 10-325 MG tablet Take 1 tablet by mouth every 6 (six) hours as needed. 90 tablet 0  . ibuprofen (ADVIL,MOTRIN) 400 MG tablet Take 1 tablet (400 mg total) by mouth every 8 (eight) hours as needed for moderate pain. 90 tablet 0  . lisinopril (PRINIVIL,ZESTRIL) 10 MG tablet Take 1 tablet (10 mg total) by mouth daily. 90 tablet 3  . pantoprazole (PROTONIX) 40 MG tablet TAKE 1 TABLET BY MOUTH 30 MINUTES BEFORE LUNCH OR DINNER 90 tablet 3  . rivaroxaban (XARELTO) 20 MG TABS tablet Take 1 tablet (20 mg total) by mouth daily with supper. 30 tablet 11  . rosuvastatin (CRESTOR) 5 MG tablet Take 1 tablet (5 mg total) by mouth at  bedtime. 90 tablet 3   No current facility-administered medications for this visit.   Family History  Problem Relation Age of Onset  . Diabetes Father   . Diabetes Sister   . Diabetes Mother   . Deep vein thrombosis Sister   . Ovarian cancer Mother   . Bone cancer Maternal Grandmother    Social History   Social History  . Marital Status: Single    Spouse Name: N/A  . Number of Children: N/A  . Years of Education: N/A   Social History Main Topics  . Smoking status: Former Research scientist (life sciences)  . Smokeless tobacco: None     Comment: qoit 12 years ago  . Alcohol Use: 3.6 oz/week    0 Standard drinks or equivalent, 6 Cans of beer per week      Comment: Beer sometimes.  . Drug Use: No     Comment: smoked joint 2 weeks ago  . Sexual Activity: Not Asked   Other Topics Concern  . None   Social History Narrative   Financial assistance approved for 100% discount at [facility] and has [facility] card per [employer]   12/25/2009   Review of Systems: CONSTITUTIONAL- No Fever, weightloss, or change in appetite. SKIN- Itchy skin HEAD- No Headache or dizziness. EYES- Needs glasses RESPIRATORY- No Cough or SOB. CARDIAC- No Palpitations,  or chest pain. GI- No nausea, vomiting, diarrhoea, constipation, abd pain. URINARY- No Frequency or dysuria. NEUROLOGIC- No Numbness,  or burning.  Objective:  Physical Exam: Filed Vitals:   07/17/15 1321  BP: 123/61  Pulse: 77  Temp: 97.5 F (36.4 C)  TempSrc: Oral  Height: 5\' 7"  (1.702 m)  Weight: 308 lb 9.6 oz (139.98 kg)  SpO2: 100%   GENERAL- alert, co-operative, appears as stated age, not in any distress. HEENT- Atraumatic, normocephalic, PERRL, EOMI, oral mucosa appears moist CARDIAC- RRR, no murmurs, rubs or gallops. RESP-  clear to auscultation bilaterally, no wheezes or crackles. ABDOMEN- Soft, nontender,  bowel sounds present. BACK- Normal curvature of the spine, No tenderness along the vertebrae, no CVA tenderness, has pannus with skin rubbing against skin in area she has pain, no tenderness on palpation, some hyperpigmentation likely from prior intertrigo, no vesicles or erythema or warmth. NEURO- No obvious Cr N abnormality, alert and oriented. EXTREMITIES- Warm and well perfused, no pedal edema. SKIN- Warm, dry, No rash or lesion. PSYCH- Normal mood and affect, appropriate thought content and speech.  Assessment & Plan:   The patient's case and plan of care was discussed with attending physician, Dr. Daryll Drown.  Flank Pain- Pt with pain that suggest musculoskeletal origin, better when she puts her hand on area, and when walking, intermittent, and not pleuritic. No CVA tenderness. She  denies SOB or chest pain. She has not taken her xalreto in 2 days. Doubt thromboembolism. No rash and character of pain no suggestive of Shingles. - Flexeril- limited amount- 315 tabs, As needed, cautioned about sedation, and taking it at night. - UA to check for hematuria. - Pt to restart Warfarin  Please see problem based charting for assessment and plan.

## 2015-07-17 NOTE — Patient Instructions (Signed)
Vitamin K Foods and Warfarin Warfarin is a medicine that helps prevent harmful blood clots by causing blood to clot more slowly. It does this by decreasing the activity of vitamin K, which promotes normal blood clotting. For the dose of warfarin you have been prescribed to work well, you need to get about the same amount of vitamin K from your food from day to day. Suddenly getting a lot more vitamin K could cause your blood to clot too quickly. A sudden decrease in vitamin K intake could cause your blood to clot too slowly. These changes in vitamin K intake could lead to dangerous blood clotsor to bleeding. WHAT GENERAL GUIDELINES DO I NEED TO FOLLOW?  Keep your intake of vitamin K consistent from day to day. To do this, you must be aware of which foods contain moderate or high amounts of vitamin K. Listed below are some foods that are very high, high, or moderately high in vitamin K. If you eat these foods, make sure you eat a consistent amount of them from day to day.  Avoid major changes in your diet, or tell your health care provider before changing your diet.  If you take a multivitamin that contains vitamin K, be sure to take it every day.  If you drink green tea, drink the same amount each day. WHAT FOODS ARE VERY HIGH IN VITAMIN K?   Greens, such as Swiss chard and beet, collard, mustard, or turnip greens (fresh or frozen, cooked).  Kale (fresh or frozen, cooked).   Parsley (raw).  Spinach (cooked).  WHAT FOODS ARE HIGH IN VITAMIN K?  Asparagus (frozen, cooked).  Broccoli.   Bok choy (cooked).   Brussels sprouts (fresh or frozen, cooked).  Cabbage (cooked).  Coleslaw. WHAT FOODS ARE MODERATELY HIGH IN VITAMIN K?  Blueberries.  Black-eyed peas.  Endive (raw).   Green leaf lettuce (raw).   Green scallions (raw).  Kale (raw).  Okra (frozen, cooked).  Plantains (fried).  Romaine lettuce (raw).   Sauerkraut (canned).   Spinach (raw).   This  information is not intended to replace advice given to you by your health care provider. Make sure you discuss any questions you have with your health care provider.

## 2015-07-18 ENCOUNTER — Telehealth: Payer: Self-pay | Admitting: Dietician

## 2015-07-18 LAB — URINALYSIS, ROUTINE W REFLEX MICROSCOPIC
Bilirubin, UA: NEGATIVE
Glucose, UA: NEGATIVE
KETONES UA: NEGATIVE
LEUKOCYTES UA: NEGATIVE
NITRITE UA: NEGATIVE
PH UA: 5 (ref 5.0–7.5)
Protein, UA: NEGATIVE
RBC, UA: NEGATIVE
SPEC GRAV UA: 1.013 (ref 1.005–1.030)
Urobilinogen, Ur: 0.2 mg/dL (ref 0.2–1.0)

## 2015-07-18 LAB — BMP8+ANION GAP
Anion Gap: 17 mmol/L (ref 10.0–18.0)
BUN / CREAT RATIO: 24 — AB (ref 9–23)
BUN: 23 mg/dL (ref 6–24)
CO2: 26 mmol/L (ref 18–29)
CREATININE: 0.95 mg/dL (ref 0.57–1.00)
Calcium: 10 mg/dL (ref 8.7–10.2)
Chloride: 98 mmol/L (ref 96–106)
GFR calc non Af Amer: 67 mL/min/{1.73_m2} (ref >59)
GFR, EST AFRICAN AMERICAN: 77 mL/min/{1.73_m2} (ref >59)
Glucose: 95 mg/dL (ref 65–99)
Potassium: 4.2 mmol/L (ref 3.5–5.2)
SODIUM: 141 mmol/L (ref 134–144)

## 2015-07-18 NOTE — Telephone Encounter (Signed)
Call to patient to confirm appointment for 07/18/15 at 9:30 lmtcb

## 2015-07-18 NOTE — Progress Notes (Signed)
Internal Medicine Clinic Attending  Case discussed with Dr. Emokpae soon after the resident saw the patient.  We reviewed the resident's history and exam and pertinent patient test results.  I agree with the assessment, diagnosis, and plan of care documented in the resident's note. 

## 2015-07-19 ENCOUNTER — Ambulatory Visit: Payer: Medicare Other

## 2015-07-19 ENCOUNTER — Ambulatory Visit: Payer: Medicare Other | Admitting: Dietician

## 2015-07-26 ENCOUNTER — Ambulatory Visit: Payer: Medicare Other | Admitting: Pharmacist

## 2015-08-07 ENCOUNTER — Ambulatory Visit (INDEPENDENT_AMBULATORY_CARE_PROVIDER_SITE_OTHER): Payer: Medicare Other | Admitting: Pharmacist

## 2015-08-07 ENCOUNTER — Ambulatory Visit (INDEPENDENT_AMBULATORY_CARE_PROVIDER_SITE_OTHER): Payer: Medicare Other | Admitting: Internal Medicine

## 2015-08-07 ENCOUNTER — Encounter: Payer: Self-pay | Admitting: Internal Medicine

## 2015-08-07 VITALS — BP 99/41 | HR 73 | Temp 97.9°F | Ht 67.0 in | Wt 311.7 lb

## 2015-08-07 DIAGNOSIS — Z86711 Personal history of pulmonary embolism: Secondary | ICD-10-CM

## 2015-08-07 DIAGNOSIS — Z7901 Long term (current) use of anticoagulants: Secondary | ICD-10-CM

## 2015-08-07 DIAGNOSIS — M25512 Pain in left shoulder: Secondary | ICD-10-CM | POA: Diagnosis not present

## 2015-08-07 DIAGNOSIS — M25519 Pain in unspecified shoulder: Secondary | ICD-10-CM | POA: Insufficient documentation

## 2015-08-07 LAB — PROTIME-INR
INR: 5.93 (ref 0.00–1.49)
PROTHROMBIN TIME: 51.1 s — AB (ref 11.6–15.2)

## 2015-08-07 MED ORDER — DICLOFENAC SODIUM 1 % TD GEL: 2.0000 g | Freq: Four times a day (QID) | TRANSDERMAL | Status: DC | PRN

## 2015-08-07 NOTE — Assessment & Plan Note (Addendum)
Assessment:  Elevated INR 5.93 after patient has inadvertently been taking double dose of warfarin for past three weeks.  She denies blood loss.   Plan:  Skip warfarin tonight and tomorrow night.  Resume warfarin on 08/09/15 with previous dose of 30mg  weekly (5mg  daily, except 2.5mg  on Monday and Thursday).  She has already discarded 10mg  tablets that she was inadvertently taking.  5mg  refills have been sent.  Return for INR check next week.

## 2015-08-07 NOTE — Patient Instructions (Addendum)
1. Please do not take your warfarin tonight or tomorrow night.  Please start taking your warfarin again on Thursday night (08/09/15).  I will send in refills of the 5mg  tablets.      Breanna Wilkins [id number] Tue Wed Thu Fri Sat   New Dose 5 mg 2.5 mg 5 mg 5 mg 2.5 mg 5 mg 5 mg         For your shoulder pain, try the voltaren gel.  Also, try ice wrapped in a towel to the area for 10 minutes at a time.  2. Please take all medications as prescribed.    3. If you have worsening of your symptoms or new symptoms arise, please call the clinic [phone] or go to the ER immediately if symptoms are severe.  Come back to clinic in 2-3 weeks so we can check how your shoulder pain is doing.  If you still have pain we may need to try an injection and/or refer you for physical therapy.  Biceps Tendon Tendinitis (Proximal) and Tenosynovitis With Rehab Tendonitis and tenosynovitis involve inflammation of the tendon and the tendon lining (sheath). The proximal biceps tendon is vulnerable to tendonitis and tenosynovitis, which causes pain and discomfort in the front of the shoulder and upper arm. The tendon lining secretes a fluid that helps lubricate the tendon, allowing for proper function without pain. When the tendon and its lining become inflamed, the tendon can no longer glide smoothly, causing pain. The proximal biceps tendon connects the biceps muscle to two bones of the shoulder. It is important for proper function of the elbow and turning the palm upward (supination) using the wrist. Proximal biceps tendon tendinitis may include a grade 1 or 2 strain of the tendon. Grade 1 strains involve a slight pull of the tendon without signs of tearing and no observed tendon lengthening. There is also no loss of strength. Grade 2 strains involve small tears in the tendon fibers. The tendon or muscle is stretched and strength is usually decreased.  SYMPTOMS   Pain, tenderness, swelling, warmth, or redness over  the front of the shoulder.  Pain that gets worse with shoulder and elbow use, especially against resistance.  Limited motion of the shoulder or elbow.  Crackling sound (crepitation) when the tendon or shoulder is moved or touched. CAUSES  The symptoms of biceps tendonitis are due to inflammation of the tendon. Inflammation may be caused by:  Strain from sudden increase in amount or intensity of activity.  Direct blow or injury to the elbow (uncommon).  Overuse or repetitive elbow bending or wrist rotation, particularly when turning the palm up, or with elbow hyperextension. RISK INCREASES WITH:  Sports that involve contact or overhead arm activity (throwing sports, gymnastics, weightlifting, bodybuilding, rock climbing).  Heavy labor.  Poor strength and flexibility.  Failure to warm up properly before activity. PREVENTION  Warm up and stretch properly before activity.  Allow time for recovery between activities.  Maintain physical fitness:  Strength, flexibility, and endurance.  Cardiovascular fitness.  Learn and use proper exercise technique. PROGNOSIS  With proper treatment, proximal biceps tendon tendonitis and tenosynovitis is usually curable within 6 weeks. Healing is usually quicker if the cause was a direct blow, not overuse.  RELATED COMPLICATIONS   Longer healing time if not properly treated or if not given enough time to heal.  Chronically inflamed tendon that causes persistent pain with activity, that may progress to constant pain and potentially rupture of the tendon.  Recurring symptoms, especially if  activity is resumed too soon or with overuse, a direct blow, or use of poor exercise technique. TREATMENT Treatment first involves ice and medicine, to reduce pain and inflammation. It is helpful to modify activities that cause pain, to reduce the chances of causing the condition to get worse. Strengthening and stretching exercises should be performed to  promote proper use of the muscles of the shoulder. These exercises may be performed at home or with a therapist. Other treatments may be given such as ultrasound or heat therapy. A corticosteroid injection may be recommended to help reduce inflammation of the tendon lining. Surgery is usually not necessary. Sometimes, if symptoms last for greater than 6 months, surgery will be advised to detach the tendon and re-insert it into the arm bone. Surgery to correct other shoulder problems that may be contributing to tendinitis may be advised before surgery for the tendinitis itself.  MEDICATION  If pain medicine is needed, nonsteroidal anti-inflammatory medicines (aspirin and ibuprofen), or other minor pain relievers (acetaminophen), are often advised.  Do not take pain medicine for 7 days before surgery.  Prescription pain relievers may be given if your caregiver thinks they are needed. Use only as directed and only as much as you need.  Corticosteroid injections may be given. These injections should only be used on the most severe cases, as one can only receive a limited number of them. HEAT AND COLD   Cold treatment (icing) should be applied for 10 to 15 minutes every 2 to 3 hours for inflammation and pain, and immediately after activity that aggravates your symptoms. Use ice packs or an ice massage.  Heat treatment may be used before performing stretching and strengthening activities prescribed by your caregiver, physical therapist, or athletic trainer. Use a heat pack or a warm water soak. SEEK MEDICAL CARE IF:   Symptoms get worse or do not improve in 2 weeks, despite treatment.  New, unexplained symptoms develop. (Drugs used in treatment may produce side effects.) EXERCISES RANGE OF MOTION (ROM) AND EXERCISES - Biceps Tendon (Proximal) and Tenosynovitis These exercises may help you when beginning to rehabilitate your injury. Your symptoms may go away with or without further involvement from  your physician, physical therapist, or athletic trainer. While completing these exercises, remember:   Restoring tissue flexibility helps normal motion to return to the joints. This allows healthier, less painful movement and activity.  An effective stretch should be held for at least 30 seconds.  A stretch should never be painful. You should only feel a gentle lengthening or release in the stretched tissue. STRETCH - Flexion, Standing  Stand with good posture. With an underhand grip on your right / left hand and an overhand grip on the opposite hand, grasp a broomstick or cane so that your hands are a little more than shoulder width apart.  Keeping your right / left elbow straight and shoulder muscles relaxed, push the stick with your opposite hand to raise your right / left arm in front of your body and then overhead. Raise your arm until you feel a stretch in your right / left shoulder, but before you have increased shoulder pain.  Try to avoid shrugging your right / left shoulder as your arm rises, by keeping your shoulder blade tucked down and toward your mid-back spine. Hold for __________ seconds.  Slowly return to the starting position. Repeat __________ times. Complete this exercise __________ times per day. STRETCH - Abduction, Supine  Lie on your back. With an underhand grip  on your right / left hand and an overhand grip on the opposite hand, grasp a broomstick or cane so that your hands are a little more than shoulder width apart.  Keeping your right / left elbow straight and shoulder muscles relaxed, push the stick with your opposite hand to raise your right / left arm out to the side of your body and then overhead. Raise your arm until you feel a stretch in your right / left shoulder, but before you have increased shoulder pain.  Try to avoid shrugging your right / left shoulder as your arm rises, by keeping your shoulder blade tucked down and toward your mid-back spine. Hold for  __________ seconds.  Slowly return to the starting position. Repeat __________ times. Complete this exercise __________ times per day. ROM - Flexion, Active-Assisted  Lie on your back. You may bend your knees for comfort.  Grasp a broomstick or cane so your hands are about shoulder width apart. Your right / left hand should grip the end of the stick so that your hand is positioned "thumbs-up," as if you were about to shake hands.  Using your healthy arm to lead, raise your right / left arm overhead until you feel a gentle stretch in your shoulder. Hold for __________ seconds.  Use the stick to assist in returning your right / left arm to its starting position. Repeat __________ times. Complete this exercise __________ times per day.  STRETCH - Flexion, Standing   Stand facing a wall. Walk your right / left fingers up the wall until you feel a moderate stretch in your shoulder. As your hand gets higher, you may need to step closer to the wall or use a door frame to walk through.  Try to avoid shrugging your right / left shoulder as your arm rises, by keeping your shoulder blade tucked down and toward your mid-back spine.  Hold for __________ seconds. Use your other hand, if needed, to ease out of the stretch and return to the starting position. Repeat __________ times. Complete this exercise __________ times per day.  ROM - Internal Rotation   Using underhand grips, grasp a stick behind your back with both hands.  While standing upright with good posture, slide the stick up your back until you feel a mild stretch in the front of your shoulder.  Hold for __________ seconds. Slowly return to your starting position. Repeat __________ times. Complete this exercise __________ times per day.  STRETCH - Internal Rotation  Place your right / left hand behind your back, palm-up.  Throw a towel or belt over your opposite shoulder. Grasp the towel with your right / left hand.  While keeping  an upright posture, gently pull up on the towel until you feel a stretch in the front of your right / left shoulder.  Avoid shrugging your right / left shoulder as your arm rises, by keeping your shoulder blade tucked down and toward your mid-back spine.  Hold for __________ seconds. Release the stretch by lowering your opposite hand. Repeat __________ times. Complete this exercise __________ times per day. STRENGTHENING EXERCISES - Biceps Tendon Tendinitis (Proximal) and Tenosynovitis These exercises may help you regain your strength after your physician has discontinued your restraint in a cast or brace. They may resolve your symptoms with or without further involvement from your physician, physical therapist or athletic trainer. While completing these exercises, remember:   Muscles can gain both the endurance and the strength needed for everyday activities through controlled  exercises.  Complete these exercises as instructed by your physician, physical therapist or athletic trainer. Increase the resistance and repetitions only as guided.  You may experience muscle soreness or fatigue, but the pain or discomfort you are trying to eliminate should never worsen during these exercises. If this pain does get worse, stop and make sure you are following the directions exactly. If the pain is still present after adjustments, discontinue the exercise until you can discuss the trouble with your caregiver. STRENGTH - Elbow Flexors, Isometric  Stand or sit upright on a firm surface. Place your right / left arm so that your hand is palm-up and at the height of your waist.  Place your opposite hand on top of your forearm. Gently push down as your right / left arm resists. Push as hard as you can with both arms, without causing any pain or movement at your right / left elbow. Hold this stationary position for __________ seconds.  Gradually release the tension in both arms. Allow your muscles to relax  completely before repeating. Repeat __________ times. Complete this exercise __________ times per day. STRENGTH - Shoulder Flexion, Isometric  With good posture and facing a wall, stand or sit about 4-6 inches away.  Keeping your right / left elbow straight, gently press the top of your fist into the wall. Increase the pressure gradually until you are pressing as hard as you can, without shrugging your shoulder or increasing any shoulder discomfort.  Hold for __________ seconds.  Release the tension slowly. Relax your shoulder muscles completely before you start the next repetition. Repeat __________ times. Complete this exercise __________ times per day.  STRENGTH - Elbow Flexors, Supinated  With good posture, stand or sit on a firm chair without armrests. Allow your right / left arm to rest at your side with your palm facing forward.  Holding a __________ weight, or gripping a rubber exercise band or tubing,  bring your hand toward your shoulder.  Allow your muscles to control the resistance as your hand returns to your side. Repeat __________ times. Complete this exercise __________ times per day.  STRENGTH - Shoulder Flexion  Stand or sit with good posture. Grasp a __________ weight, or an exercise band or tubing, so that your hand is "thumbs-up," like when you shake hands.  Slowly lift your right / left arm as far as you can, without increasing any shoulder pain. At first, many people can only raise their hand to shoulder height.  Avoid shrugging your right / left shoulder as your arm rises, by keeping your shoulder blade tucked down and toward your mid-back spine.  Hold for __________ seconds. Control the descent of your hand as you slowly return to your starting position. Repeat __________ times. Complete this exercise __________ times per day.   This information is not intended to replace advice given to you by your health care provider. Make sure you discuss any questions  you have with your health care provider.   Document Released: 07/07/2005 Document Revised: 07/28/2014 Document Reviewed: 10/19/2008 Elsevier Interactive Patient Education [employer].

## 2015-08-07 NOTE — Assessment & Plan Note (Addendum)
Assessment:  Left anterior shoulder pain. Sharp, 8/10 lasting 20-71min.  Worse with some activities.  Anterior shoulder and proximal bicep is TTP and painful with supination c/w bicep tendinopathy.  Has some pain with painful arc but neg drop arm.  Rotator cuff may be involved as she has known rotator cuff pathology in right arm. Plan: recommend conservative care with rest, ice, no NSAID given her coumadin use but she can try her current pain med (take prn Vicodin for knee OA).  RTC in 2-3 weeks for follow-up.  Can consider referral for steroid injection and/or PT if symptoms persists.

## 2015-08-07 NOTE — Progress Notes (Signed)
Subjective:    Patient ID: Breanna Wilkins, female    DOB: 1961/01/26, 55 y.o.   MRN: [id number]  HPI Comments: Breanna Wilkins is a 55 year old woman with PMH as below here for follow-up of elevated INR.  Had been on warfarin 30mg  weekly up until Dec 8 when she was switched to Xarelto.  She was on Xarelto for about three weeks but had some hematuria and decided she felt more comfortable being on a medication where she could be monitored so resumed coumadin (see Dec 27 clinic visit).  She then resumed her warfarin but says she thinks the dose has been twice as much as normal (she has inadvertently been taking old rx for 10mg  tablets instead of 5mg  tablets).  She denies bleeding or dark stools.   Also. with sharp pain in left arm (anterior shoulder) x twice last week and once today.  Pain is 8/10, lasting about 20-30 minutes.  No chest pain/pressure, dyspnea, diaphoresis, N/V or radiation to back/jaw/down arm.  Pain does not feel like prior MI or PE.  She says she has rotator cuff problems in the right shoulder.   Past Medical History  Diagnosis Date  . Shoulder pain   . Pulmonary embolus ([place]) 01/16/10    on Coumadin indefinitely  . Abdominal hernia   . Hypertension   . Overweight(278.02)   . Lung nodule 12/2009    Very small, left upper lobe by CT June, 2011, one-year followup was stable  . Shortness of breath   . Warfarin anticoagulation   . Ejection fraction     65-70%, vigorous function, echo, March, 2011  . CHF (congestive heart failure) ([place])   . OSA (obstructive sleep apnea)     CPAP  . CAD (coronary artery disease)     Perioperative non-STEMI March, 2014  //   cardiac catheterization at time of non-STEMI, March, 2014, minor nonobstructive coronary disease, vigorous LV function, possibly vasospastic event versus stress cardiomyopathy. No further workup of coronary disease  . Ejection fraction     Vigorous function at time of catheterization October 06, 2012,    Current Outpatient  Prescriptions on File Prior to Visit  Medication Sig Dispense Refill  . aspirin 81 MG chewable tablet Chew 1 tablet (81 mg total) by mouth daily. 30 tablet 1  . BOOSTRIX 5-2.5-18.5 injection     . carvedilol (COREG) 6.25 MG tablet Take 1 tablet (6.25 mg total) by mouth 2 (two) times daily with a meal. 180 tablet 3  . Cyanocobalamin (VITAMIN B 12 PO) Take by mouth.    . cyclobenzaprine (FEXMID) 7.5 MG tablet Take 1 tablet (7.5 mg total) by mouth 3 (three) times daily as needed for muscle spasms. 15 tablet 0  . diphenhydrAMINE-zinc acetate (BENADRYL EXTRA STRENGTH) cream Apply 1 application topically 3 (three) times daily as needed for itching. 28.4 g 0  . furosemide (LASIX) 80 MG tablet Take 1 tablet (80 mg total) by mouth daily. 90 tablet 3  . HYDROcodone-acetaminophen (NORCO) 10-325 MG tablet Take 1 tablet by mouth every 6 (six) hours as needed. 90 tablet 0  . ibuprofen (ADVIL,MOTRIN) 400 MG tablet Take 1 tablet (400 mg total) by mouth every 8 (eight) hours as needed for moderate pain. 90 tablet 0  . lisinopril (PRINIVIL,ZESTRIL) 10 MG tablet Take 1 tablet (10 mg total) by mouth daily. 90 tablet 3  . pantoprazole (PROTONIX) 40 MG tablet TAKE 1 TABLET BY MOUTH 30 MINUTES BEFORE LUNCH OR DINNER 90 tablet 3  .  rosuvastatin (CRESTOR) 5 MG tablet Take 1 tablet (5 mg total) by mouth at bedtime. 90 tablet 3   No current facility-administered medications on file prior to visit.    Review of Systems  Constitutional: Negative for fever, chills, appetite change and unexpected weight change.  Respiratory:       Occasional DOE  Cardiovascular: Negative for chest pain, palpitations and leg swelling.  Gastrointestinal: Negative for nausea, vomiting, abdominal pain, diarrhea, constipation and blood in stool.  Genitourinary: Negative for dysuria and difficulty urinating.  Neurological: Negative for syncope and light-headedness.  Hematological: Does not bruise/bleed easily.       Filed Vitals:    08/07/15 1447  BP: 99/41  Pulse: 73  Temp: 97.9 F (36.6 C)  TempSrc: Oral  Height: 5\' 7"  (1.702 m)  Weight: 311 lb 11.2 oz (141.386 kg)  SpO2: 100%     Objective:   Physical Exam  Constitutional: She is oriented to person, place, and time. She appears well-developed. No distress.  HENT:  Head: Normocephalic and atraumatic.  Neck: Neck supple.  Cardiovascular: Normal rate, regular rhythm and normal heart sounds.  Exam reveals no gallop and no friction rub.   No murmur heard. Pulmonary/Chest: Effort normal and breath sounds normal. No respiratory distress. She has no wheezes. She has no rales.  Abdominal: Soft. Bowel sounds are normal. She exhibits no distension and no mass. There is no tenderness. There is no rebound and no guarding.  Musculoskeletal: She exhibits tenderness. She exhibits no edema.  Left arm - Biceps tendon TTP.  Decrease active arm abduction (~90 degrees 2/2 pain), improves passively.  ROM otherwise normal.  Strength and sensation intact.  Anterior shoulder pain with supination.  Negative drop arm.  Neurological: She is alert and oriented to person, place, and time. No cranial nerve deficit.  Skin: Skin is warm. No rash noted. She is not diaphoretic. No erythema.  Psychiatric: She has a normal mood and affect. Her behavior is normal. Judgment and thought content normal.  Vitals reviewed.         Assessment & Plan:  Please see problem based charting for A&P.

## 2015-08-09 ENCOUNTER — Other Ambulatory Visit: Payer: Self-pay | Admitting: Internal Medicine

## 2015-08-09 MED ORDER — HYDROCODONE-ACETAMINOPHEN 10-325 MG PO TABS: 1.0000 | ORAL_TABLET | Freq: Four times a day (QID) | ORAL | Status: DC | PRN

## 2015-08-09 NOTE — Progress Notes (Signed)
Case discussed with Dr. Wilson soon after the resident saw the patient. We reviewed the resident's history and exam and pertinent patient test results. I agree with the assessment, diagnosis, and plan of care documented in the resident's note. 

## 2015-08-14 ENCOUNTER — Ambulatory Visit (INDEPENDENT_AMBULATORY_CARE_PROVIDER_SITE_OTHER): Payer: Medicare Other | Admitting: Pharmacist

## 2015-08-14 DIAGNOSIS — Z7901 Long term (current) use of anticoagulants: Secondary | ICD-10-CM

## 2015-08-14 DIAGNOSIS — I82419 Acute embolism and thrombosis of unspecified femoral vein: Secondary | ICD-10-CM

## 2015-08-14 DIAGNOSIS — I82439 Acute embolism and thrombosis of unspecified popliteal vein: Secondary | ICD-10-CM | POA: Diagnosis not present

## 2015-08-14 LAB — POCT INR: INR: 4.7

## 2015-08-14 NOTE — Patient Instructions (Signed)
Patient educated about medication as defined in this encounter and verbalized understanding by repeating back instructions provided.   

## 2015-08-14 NOTE — Progress Notes (Signed)
Anticoagulation Management Breanna Wilkins is a 55 y.o. female who reports to the clinic for monitoring of warfarin treatment.    Indication: DVT and PEhistory Duration: indefinite  Anticoagulation Clinic Visit History: Patient does not report signs/symptoms of bleeding or thromboembolism, no recent changes. Of note, patient switched from warfarin to rivaroxaban, but subsequently d/c rivaroxaban 07/18/15. Patient then inadvertently re-started warfarin using old 10 mg tablets rather than 5 mg as prescribed. After visit on 08/07/15, patient held 2 doses, then took 5 mg Sat, 5 mg Sun, and 2.5 mg Mon. Today, INR is still elevated and therefore we are currently re-establishing a weekly therapeutic regimen.   Anticoagulation Episode Summary    Current INR goal 2.5-3.5   Next INR check 08/21/2015   INR from last check    Weekly max dose    Target end date    [facility] check location [facility]   Preferred lab    Send INR reminders to       Comments        ASSESSMENT Recent Results: Lab Results  Component Value Date   INR 4.7 08/14/2015   INR 5.93* 08/07/2015   INR 1.2 06/28/2015   INR today: Supratherapeutic  Anticoagulation Dosing: INR as of 08/14/2015 and Previous Dosing Information    INR Dt INR Goal Wkly Tot [name]     2.5-3.5 30 mg 5 mg 2.5 mg 5 mg 5 mg 2.5 mg 5 mg 5 mg    Anticoagulation Dose Instructions as of 08/14/2015      Total [name]   New Dose 22.5 mg 5 mg 2.5 mg 0 mg 2.5 mg 2.5 mg 5 mg 5 mg     (5 mg x 1)  (5 mg x 0.5)  -  (5 mg x 0.5)  (5 mg x 0.5)  (5 mg x 1)  (5 mg x 1)                           PLAN Hold x 1 dose, then reduce Wednesday dose by 1/2. Will continue to monitor closely to re-establish weekly therapeutic dose.  Patient Instructions  Patient educated about medication as defined in this encounter and verbalized understanding by repeating back instructions provided.   Patient advised to contact clinic  or seek medical attention if signs/symptoms of bleeding or thromboembolism occur.  Follow-up Return in about 1 week (around 08/21/2015).  Donnah Levert J

## 2015-08-21 ENCOUNTER — Ambulatory Visit (INDEPENDENT_AMBULATORY_CARE_PROVIDER_SITE_OTHER): Payer: Medicare Other | Admitting: Pharmacist

## 2015-08-21 DIAGNOSIS — I82439 Acute embolism and thrombosis of unspecified popliteal vein: Secondary | ICD-10-CM | POA: Diagnosis not present

## 2015-08-21 DIAGNOSIS — Z7901 Long term (current) use of anticoagulants: Secondary | ICD-10-CM | POA: Diagnosis not present

## 2015-08-21 DIAGNOSIS — I82419 Acute embolism and thrombosis of unspecified femoral vein: Secondary | ICD-10-CM | POA: Diagnosis not present

## 2015-08-21 LAB — POCT INR: INR: 3.1

## 2015-08-21 NOTE — Progress Notes (Signed)
Anticoagulation Management Breanna Wilkins is a 55 y.o. female who reports to the clinic for monitoring of warfarin treatment.    Indication: DVT and PE Duration: indefinite  Anticoagulation Clinic Visit History: Patient does not report signs/symptoms of bleeding or thromboembolism  Anticoagulation Episode Summary    Current INR goal 2.5-3.5   Next INR check 09/03/2015   INR from last check 4.7! (08/14/2015)   Weekly max dose    Target end date    [facility] check location [facility]   Preferred lab    Send INR reminders to       Comments        ASSESSMENT Recent Results: Recent results are below, the most recent result is correlated with a dose of 22.5 mg per week: Lab Results  Component Value Date   INR 3.1 08/21/2015   INR 4.7 08/14/2015   INR 5.93* 08/07/2015   INR 1.2 06/28/2015    INR today: Therapeutic  Anticoagulation Dosing: INR as of 08/21/2015 and Previous Dosing Information    INR Dt INR Goal [name]   08/21/2015 3.1 2.5-3.5 22.5 mg 5 mg 2.5 mg 2.5 mg 2.5 mg 2.5 mg 2.5 mg 5 mg    Anticoagulation Dose Instructions as of 08/21/2015      Total [name]   New Dose 22.5 mg 5 mg 2.5 mg 2.5 mg 2.5 mg 2.5 mg 2.5 mg 5 mg     (5 mg x 1)  (5 mg x 0.5)  (5 mg x 0.5)  (5 mg x 0.5)  (5 mg x 0.5)  (5 mg x 0.5)  (5 mg x 1)                           PLAN Weekly dose was unchanged by 0% to 22.5 mg per week  The patient was able to recite back her correct dosing schedule. Patient advised to contact clinic or seek medical attention if signs/symptoms of bleeding or thromboembolism occur.  Follow-up September 03, 2015   Tanya Makhlouf  15 minutes spent face-to-face with the patient during the encounter. 50% of time spent on education. 50% of time was spent on assessment and plan.

## 2015-08-22 NOTE — Progress Notes (Signed)
Patient was seen in clinic with Tanya Makhlouf, PharmD candidate. I agree with the assessment and plan of care documented by Tanya. 

## 2015-08-22 NOTE — Patient Instructions (Signed)
Patient educated about medication as defined in this encounter and verbalized understanding by repeating back instructions provided.   

## 2015-08-23 NOTE — Progress Notes (Signed)
Indication: Deep venous thrombosis. Duration: Lifelong per patient preference. INR: At target. Agree with Ms. Makhlouf's assessment and plan.

## 2015-09-03 ENCOUNTER — Ambulatory Visit (INDEPENDENT_AMBULATORY_CARE_PROVIDER_SITE_OTHER): Payer: Medicare Other | Admitting: Pharmacist

## 2015-09-03 DIAGNOSIS — Z86718 Personal history of other venous thrombosis and embolism: Secondary | ICD-10-CM

## 2015-09-03 DIAGNOSIS — Z7901 Long term (current) use of anticoagulants: Secondary | ICD-10-CM

## 2015-09-03 LAB — POCT INR: INR: 2

## 2015-09-03 NOTE — Progress Notes (Signed)
Anti-Coagulation Progress Note  Breanna Wilkins is a 55 y.o. female who is currently on an anti-coagulation regimen.    RECENT RESULTS: Recent results are below, the most recent result is correlated with a dose of 22.5 mg. per week: Lab Results  Component Value Date   INR 2.00 09/03/2015   INR 3.1 08/21/2015   INR 4.7 08/14/2015    ANTI-COAG DOSE: Anticoagulation Dose Instructions as of 09/03/2015      [name] Tue Wed Thu Fri Sat   New Dose 5 mg 5 mg 2.5 mg 5 mg 2.5 mg 5 mg 2.5 mg       ANTICOAG SUMMARY: Anticoagulation Episode Summary    Current INR goal 2.5-3.5   Next INR check 09/10/2015   INR from last check 2.00! (09/03/2015)   Weekly max dose    Target end date    [facility]   Preferred lab    Send INR reminders to       Comments         ANTICOAG TODAY: Anticoagulation Summary as of 09/03/2015    INR goal 2.5-3.5   Selected INR 2.00! (09/03/2015)   Next INR check 09/10/2015   Target end date     Anticoagulation Episode Summary    [facility]   Preferred lab    Send INR reminders to    Comments       PATIENT INSTRUCTIONS: Patient Instructions  Patient instructed to take medications as defined in the Anti-coagulation Track section of this encounter.  Patient instructed to take today's dose.  Patient verbalized understanding of these instructions.       FOLLOW-UP Return in 7 days (on 09/10/2015) for Follow up INR at 2:15PM.  [name], III Pharm.D., CACP

## 2015-09-03 NOTE — Patient Instructions (Signed)
Patient instructed to take medications as defined in the Anti-coagulation Track section of this encounter.  Patient instructed to take today's dose.  Patient verbalized understanding of these instructions.    

## 2015-09-04 NOTE — Progress Notes (Signed)
INTERNAL MEDICINE TEACHING ATTENDING ADDENDUM - Lalla Brothers M.D  Duration- indefinite per patient preference, Indication- DVT, INR- therapeutic. Agree with pharmacy recommendations as outlined in their note.

## 2015-09-10 ENCOUNTER — Ambulatory Visit (INDEPENDENT_AMBULATORY_CARE_PROVIDER_SITE_OTHER): Payer: Medicare Other | Admitting: Pharmacist

## 2015-09-10 DIAGNOSIS — Z7901 Long term (current) use of anticoagulants: Secondary | ICD-10-CM

## 2015-09-10 DIAGNOSIS — Z86718 Personal history of other venous thrombosis and embolism: Secondary | ICD-10-CM

## 2015-09-10 DIAGNOSIS — Z86711 Personal history of pulmonary embolism: Secondary | ICD-10-CM

## 2015-09-10 LAB — POCT INR: INR: 2.6

## 2015-09-10 NOTE — Progress Notes (Signed)
Anti-Coagulation Progress Note  Breanna Wilkins is a 55 y.o. female who is currently on an anti-coagulation regimen.    RECENT RESULTS: Recent results are below, the most recent result is correlated with a dose of 27.5 mg. per week: Lab Results  Component Value Date   INR 2.6 09/10/2015   INR 2.00 09/03/2015   INR 3.1 08/21/2015    ANTI-COAG DOSE: Anticoagulation Dose Instructions as of 09/10/2015      [name] Tue Wed Thu Fri Sat   New Dose 5 mg 5 mg 2.5 mg 5 mg 5 mg 2.5 mg 5 mg       ANTICOAG SUMMARY: Anticoagulation Episode Summary    Current INR goal 2.5-3.5   Next INR check 10/01/2015   INR from last check 2.6 (09/10/2015)   Weekly max dose    Target end date    [facility]   Preferred lab    Send INR reminders to       Comments         ANTICOAG TODAY: Anticoagulation Summary as of 09/10/2015    INR goal 2.5-3.5   Selected INR 2.6 (09/10/2015)   Next INR check 10/01/2015   Target end date     Anticoagulation Episode Summary    [facility]   Preferred lab    Send INR reminders to    Comments       PATIENT INSTRUCTIONS: Patient Instructions  Patient instructed to take medications as defined in the Anti-coagulation Track section of this encounter.  Patient instructed to take today's dose.  Patient verbalized understanding of these instructions.       FOLLOW-UP Return in about 3 weeks (around 10/01/2015) for Follow up INR @ 2:15 PM.  [name], III Pharm.D., CACP

## 2015-09-10 NOTE — Patient Instructions (Signed)
Patient instructed to take medications as defined in the Anti-coagulation Track section of this encounter.  Patient instructed to take today's dose.  Patient verbalized understanding of these instructions.    

## 2015-09-11 NOTE — Progress Notes (Signed)
I have reviewed Dr. Gladstone Pih note.  Patient is on lifelong coumadin by choice for DVT.  Her INR goal is 2.5-3.5.  INR today 2.6 and coumadin increased.

## 2015-09-21 ENCOUNTER — Encounter: Payer: Self-pay | Admitting: Internal Medicine

## 2015-09-21 ENCOUNTER — Ambulatory Visit (INDEPENDENT_AMBULATORY_CARE_PROVIDER_SITE_OTHER): Payer: Medicare Other | Admitting: Internal Medicine

## 2015-09-21 VITALS — BP 148/67 | HR 71 | Temp 98.0°F | Ht 67.0 in | Wt 310.1 lb

## 2015-09-21 DIAGNOSIS — Z0000 Encounter for general adult medical examination without abnormal findings: Secondary | ICD-10-CM | POA: Diagnosis not present

## 2015-09-21 DIAGNOSIS — G5601 Carpal tunnel syndrome, right upper limb: Secondary | ICD-10-CM

## 2015-09-21 DIAGNOSIS — L853 Xerosis cutis: Secondary | ICD-10-CM | POA: Diagnosis not present

## 2015-09-21 DIAGNOSIS — R5383 Other fatigue: Secondary | ICD-10-CM

## 2015-09-21 DIAGNOSIS — K5900 Constipation, unspecified: Secondary | ICD-10-CM

## 2015-09-21 NOTE — Assessment & Plan Note (Signed)
Assessment:  She presents with pain and paresthesias distribution of the right median nerve, with positive Tinel and Phalen sign. Moreover, I think this is likely carpal tunnel syndrome. She also endorses worsening fatigue, xerosis, and constipation, concerning for hypothyroidism particularly in the setting of new carpal tunnel syndrome.  Plan:  We got her a neutral wrist splint to wear at night. I'll also check a TSH today.

## 2015-09-21 NOTE — Assessment & Plan Note (Signed)
She is due for lipid panel; since I was checking a TSH anyway, I threw on a lipid panel as well.

## 2015-09-21 NOTE — Patient Instructions (Addendum)
Ms. Batte,  It was great to meet you today.  The pain in your right wrist is from carpal tunnel syndrome.  Given you a wrist splint ; if it's very important that he wear this at night. Come see [place] in 3 months. If you are still having pain then, we may have to refer you to a hand surgeon.  I've also checked her thyroid level today; I'll give you a call if it's out of whack.  Take care, and we'll see in 1 month, Dr. Melburn Hake

## 2015-09-21 NOTE — Progress Notes (Signed)
Medicine attending: Medical history, presenting problems, physical findings, and medications, reviewed with resident physician Dr Kyle Flores on the day of the patient visit and I concur with his evaluation and management plan. 

## 2015-09-21 NOTE — Progress Notes (Signed)
Patient ID: Breanna Wilkins, female   DOB: Apr 13, 1961, 55 y.o.   MRN: [id number] [facility] Subjective:   Patient ID: Breanna Wilkins female   DOB: 12-Dec-1960 55 y.o.   MRN: [id number]  HPI: [name] is a 55 y.o. female with a history per below presenting with right wrist pain.  Right wrist pain:  She started having tingling right wrist pain shooting from her wrist to the tip of her right second finger. She tells me it hard for her to open  Pill bottles. She hasn't taken anything for the pain.  She is not a smoker and I have reviewed her medications with her today.  Please see the assessment and plan for the status of the patient's chronic medical problems.  Past Medical History  Diagnosis Date  . Shoulder pain   . Pulmonary embolus ([place]) 01/16/10    on Coumadin indefinitely  . Abdominal hernia   . Hypertension   . Overweight(278.02)   . Lung nodule 12/2009    Very small, left upper lobe by CT June, 2011, one-year followup was stable  . Shortness of breath   . Warfarin anticoagulation   . Ejection fraction     65-70%, vigorous function, echo, March, 2011  . CHF (congestive heart failure) ([place])   . OSA (obstructive sleep apnea)     CPAP  . CAD (coronary artery disease)     Perioperative non-STEMI March, 2014  //   cardiac catheterization at time of non-STEMI, March, 2014, minor nonobstructive coronary disease, vigorous LV function, possibly vasospastic event versus stress cardiomyopathy. No further workup of coronary disease  . Ejection fraction     Vigorous function at time of catheterization October 06, 2012,   Review of Systems  Constitutional: Negative for fever and chills.  Musculoskeletal: Positive for joint pain. Negative for myalgias.  Skin: Negative for rash.  Neurological: Positive for tingling and sensory change. Negative for dizziness, focal weakness and headaches.   Objective:  Physical Exam: Filed Vitals:   09/21/15 1544   BP: 148/67  Pulse: 71  Temp: 98 F (36.7 C)  TempSrc: Oral  Height: 5\' 7"  (1.702 m)  Weight: 310 lb 1.6 oz (140.66 kg)  SpO2: 100%   General: resting in bed comfortably, appropriately conversational HEENT: subtle proptosis, oropharynx without lesions Ext: warm and well perfused, without pedal edema Lymph: non-tender mobile left anterior cervical lymph node Skin: no rash, hair, or nail changes Neuro: right wrist appears structurally normal, with positive tinel and phalen's signs   Assessment & Plan:  Case discussed with Dr. Beryle Beams  Carpal tunnel syndrome Assessment:  She presents with pain and paresthesias distribution of the right median nerve, with positive Tinel and Phalen sign. Moreover, I think this is likely carpal tunnel syndrome. She also endorses worsening fatigue, xerosis, and constipation, concerning for hypothyroidism particularly in the setting of new carpal tunnel syndrome.  Plan:  We got her a neutral wrist splint to wear at night. I'll also check a TSH today.   Preventative health care She is due for lipid panel; since I was checking a TSH anyway, I threw on a lipid panel as well.   Medications Ordered No orders of the defined types were placed in this encounter.   Other Orders Orders Placed This Encounter  Procedures  . TSH  . Lipid Profile   Follow Up: Return in about 3 months (around 12/22/2015).

## 2015-09-22 LAB — LIPID PANEL
Chol/HDL Ratio: 3.3 ratio units (ref 0.0–4.4)
Cholesterol, Total: 159 mg/dL (ref 100–199)
HDL: 48 mg/dL (ref >39)
LDL CALC: 86 mg/dL (ref 0–99)
Triglycerides: 127 mg/dL (ref 0–149)
VLDL CHOLESTEROL CAL: 25 mg/dL (ref 5–40)

## 2015-09-22 LAB — TSH: TSH: 0.741 u[IU]/mL (ref 0.450–4.500)

## 2015-10-01 ENCOUNTER — Ambulatory Visit: Payer: Medicare Other

## 2015-10-05 ENCOUNTER — Other Ambulatory Visit: Payer: Self-pay | Admitting: Internal Medicine

## 2015-10-05 NOTE — Telephone Encounter (Signed)
Pt requesting hydrocodone to be filled.  °

## 2015-10-08 ENCOUNTER — Ambulatory Visit (INDEPENDENT_AMBULATORY_CARE_PROVIDER_SITE_OTHER): Payer: Medicare Other | Admitting: Pharmacist

## 2015-10-08 DIAGNOSIS — Z7901 Long term (current) use of anticoagulants: Secondary | ICD-10-CM

## 2015-10-08 DIAGNOSIS — Z86718 Personal history of other venous thrombosis and embolism: Secondary | ICD-10-CM

## 2015-10-08 LAB — POCT INR: INR: 3.2

## 2015-10-08 MED ORDER — HYDROCODONE-ACETAMINOPHEN 10-325 MG PO TABS: 1.0000 | ORAL_TABLET | Freq: Four times a day (QID) | ORAL | Status: DC | PRN

## 2015-10-08 MED ORDER — WARFARIN SODIUM 5 MG PO TABS: ORAL_TABLET | ORAL | Status: DC

## 2015-10-08 NOTE — Telephone Encounter (Signed)
Pt informed

## 2015-10-08 NOTE — Patient Instructions (Signed)
Patient instructed to take medications as defined in the Anti-coagulation Track section of this encounter.  Patient instructed to take today's dose.  Patient verbalized understanding of these instructions.    

## 2015-10-08 NOTE — Progress Notes (Signed)
Anti-Coagulation Progress Note  Breanna Wilkins is a 55 y.o. female who is currently on an anti-coagulation regimen.    RECENT RESULTS: Recent results are below, the most recent result is correlated with a dose of 30 mg. per week: Lab Results  Component Value Date   INR 3.20 10/08/2015   INR 2.6 09/10/2015   INR 2.00 09/03/2015    ANTI-COAG DOSE: Anticoagulation Dose Instructions as of 10/08/2015      [name] Tue Wed Thu Fri Sat   New Dose 5 mg 5 mg 2.5 mg 5 mg 5 mg 2.5 mg 5 mg       ANTICOAG SUMMARY: Anticoagulation Episode Summary    Current INR goal 2.5-3.5   Next INR check 11/05/2015   INR from last check 3.20 (10/08/2015)   Weekly max dose    Target end date    [facility]   Preferred lab    Send INR reminders to       Comments         ANTICOAG TODAY: Anticoagulation Summary as of 10/08/2015    INR goal 2.5-3.5   Selected INR 3.20 (10/08/2015)   Next INR check 11/05/2015   Target end date     Anticoagulation Episode Summary    [facility]   Preferred lab    Send INR reminders to    Comments       PATIENT INSTRUCTIONS: Patient Instructions  Patient instructed to take medications as defined in the Anti-coagulation Track section of this encounter.  Patient instructed to take today's dose.  Patient verbalized understanding of these instructions.       FOLLOW-UP Return in 4 weeks (on 11/05/2015) for Follow up INR at 2:30PM.  [name], III Pharm.D., CACP

## 2015-11-05 ENCOUNTER — Ambulatory Visit: Payer: Medicare Other

## 2015-11-12 ENCOUNTER — Ambulatory Visit: Payer: Medicare Other

## 2015-11-12 ENCOUNTER — Other Ambulatory Visit: Payer: Self-pay | Admitting: Internal Medicine

## 2015-11-12 ENCOUNTER — Ambulatory Visit (INDEPENDENT_AMBULATORY_CARE_PROVIDER_SITE_OTHER): Payer: Medicare Other | Admitting: Pharmacist

## 2015-11-12 ENCOUNTER — Ambulatory Visit (INDEPENDENT_AMBULATORY_CARE_PROVIDER_SITE_OTHER): Payer: Medicare Other | Admitting: Dietician

## 2015-11-12 VITALS — Wt 306.0 lb

## 2015-11-12 DIAGNOSIS — Z7901 Long term (current) use of anticoagulants: Secondary | ICD-10-CM

## 2015-11-12 DIAGNOSIS — Z6841 Body Mass Index (BMI) 40.0 and over, adult: Secondary | ICD-10-CM | POA: Diagnosis not present

## 2015-11-12 DIAGNOSIS — Z713 Dietary counseling and surveillance: Secondary | ICD-10-CM

## 2015-11-12 DIAGNOSIS — Z86718 Personal history of other venous thrombosis and embolism: Secondary | ICD-10-CM

## 2015-11-12 LAB — POCT INR: INR: 2.3

## 2015-11-12 NOTE — Patient Instructions (Signed)
Patient instructed to take medications as defined in the Anti-coagulation Track section of this encounter.  Patient instructed to take today's dose.  Patient verbalized understanding of these instructions.    

## 2015-11-12 NOTE — Patient Instructions (Signed)
Use MyFitness PAL app on your phone to track your food intake daily.   This should help you to limit your calories which should lead to your desired weight loss. It also allows you to budget your calories and enjoy the foods that are special to you.  The daily goal is to not go over the amount of calories that MyFitbnessPAl allows.   Please call as needed with questions!  Butch Penny [phone]

## 2015-11-12 NOTE — Progress Notes (Signed)
Anti-Coagulation Progress Note  Breanna Wilkins is a 55 y.o. female who is currently on an anti-coagulation regimen.    RECENT RESULTS: Recent results are below, the most recent result is correlated with a dose of 30 mg. per week: Lab Results  Component Value Date   INR 2.30 11/12/2015   INR 3.20 10/08/2015   INR 2.6 09/10/2015    ANTI-COAG DOSE: Anticoagulation Dose Instructions as of 11/12/2015      [name] Tue Wed Thu Fri Sat   New Dose 5 mg 5 mg 5 mg 5 mg 5 mg 2.5 mg 5 mg       ANTICOAG SUMMARY: Anticoagulation Episode Summary    Current INR goal 2.5-3.5   Next INR check 12/03/2015   INR from last check 2.30! (11/12/2015)   Weekly max dose    Target end date    [facility]   Preferred lab    Send INR reminders to       Comments         ANTICOAG TODAY: Anticoagulation Summary as of 11/12/2015    INR goal 2.5-3.5   Selected INR 2.30! (11/12/2015)   Next INR check 12/03/2015   Target end date     Anticoagulation Episode Summary    [facility]   Preferred lab    Send INR reminders to    Comments       PATIENT INSTRUCTIONS: Patient Instructions  Patient instructed to take medications as defined in the Anti-coagulation Track section of this encounter.  Patient instructed to take today's dose.  Patient verbalized understanding of these instructions.       FOLLOW-UP Return in about 3 weeks (around 12/03/2015) for Follow up INR at 2:45PM.  [name], III Pharm.D., CACP

## 2015-11-12 NOTE — Progress Notes (Signed)
Indication: Venous thromboembolism. Duration: Indefinite per patient preference. INR: At target. Agree with Dr. Groce's assessment and plan. 

## 2015-11-12 NOTE — Progress Notes (Signed)
  Medical Nutrition Therapy:  Appt start time: 1430 end time:  1500. Visit # 2- last visit 06/2015  Assessment:  Primary concerns today: weight loss.  Patient is here in follow up for assistance with weight loss. She wants a meal plan for 2000 calories. She is excited that she has cut back on beer from regular to lite and form 15-16/week to 1-2 /week, she is exercising at the [facility] for ~ 60 minutes/day 3-4 times a week. She is eating salmon, salad,  After discussing possible methods of tracking her food intake including ameal plan, she selected a trail of using My Fitness Pal to track her food intake Learning Readiness: Ready, Change in progress in regards to increasing activity and food intake now changning  ANTHROPOMETRICS: weight: 306#, height: 67", BMI: 49- class III obesity PHYSICAL ACTIVITY: reports exercising at the  [employer] with silver [employer]aker for ~ 60 minutes a day SLEEP:up to 1-2 am, gets up early, verbalized that this is a poor sleep habits MEDICATIONS: crestor, coumadin, B12 po daily DIETARY INTAKE: Usual eating pattern includes 3 meals and ~1 snack at night per day. Everyday foods include starches, .  Avoided foods include fried foods as much as possible.   24-hr recall:  B ( 930 AM): salad with salmon L ( 2 PM): chinese take out, fried rice,  D ( 7-8PM):  baked meat, more vegetables Snk ( PM) popcorn:  Beverages: 30-40 oz sugar free koolaid per day, water, lite beer- 1-2/week, water instead of regular soda and half lemonade and tea  Estimated daily energy needs: 1950 calories for 1-2 # per week weight loss  Progress Towards Goal(s):  In progress.   Nutritional Diagnosis:  NB-1.1 Food and nutrition-related knowledge deficit As related to lack of sufficient prior meal planning education improving  As evidenced by her report, questions and stated food choices.    Intervention:  Nutrition education about meal planning for weight loss, included helping her set up  myfitnesspal app on  her phone and learning how to track her daily intake using this app. Coordination of care: consider weight loss medication or weight loss surgery if weight loss by lifestyle unsuccessful  Teaching Method Utilized: Visual, Auditory,  Hands on Handouts given during visit include: Plate method, AVS Barriers to learning/adherence to lifestyle change: support, knee pain, lack of insurance coverage for prediabetes/weight loss Demonstrated degree of understanding via:  Teach Back   Monitoring/Evaluation:  Dietary intake, exercise, and body weight in 4 week(s).

## 2015-11-20 ENCOUNTER — Ambulatory Visit (INDEPENDENT_AMBULATORY_CARE_PROVIDER_SITE_OTHER): Payer: Medicare Other | Admitting: Internal Medicine

## 2015-11-20 ENCOUNTER — Encounter: Payer: Self-pay | Admitting: Internal Medicine

## 2015-11-20 VITALS — BP 119/53 | HR 63 | Temp 97.8°F | Ht 67.0 in | Wt 307.5 lb

## 2015-11-20 DIAGNOSIS — L309 Dermatitis, unspecified: Secondary | ICD-10-CM

## 2015-11-20 MED ORDER — TRIAMCINOLONE ACETONIDE 0.025 % EX OINT: TOPICAL_OINTMENT | CUTANEOUS | Status: DC

## 2015-11-20 NOTE — Assessment & Plan Note (Addendum)
This appears to be simple impetiginzed nipple dermatitis. I doubt Paget's disease because she's had nipple dermatitis on the other nipple that was reportedly biopsied to rule out Paget's, a Bi-Rads 1 mammogram in October 2016, and I don't feel an underlying mass on my exam. There's no nipple discharge suggestive of intraductal papillary carcinoma.  We'll try triamcinolone 0.25% ointment nightly in addition totopical bacitracin in the morning for 2 weeks. If it doesn't resolve, I've asked her to call for an appointment so I can punch biopsy the site to rule out Paget's.

## 2015-11-20 NOTE — Progress Notes (Signed)
Patient ID: Breanna Wilkins, female   DOB: Jan 23, 1961, 55 y.o.   MRN: [id number] [facility] Subjective:   Patient ID: Breanna Wilkins female   DOB: 1960/08/23 55 y.o.   MRN: [id number]  HPI: [name] is a 55 y.o. female with a history of pulmonary embolus on indefinite warfarin therapy per patient preference, hypertension, and heart failure with preserved ejection fraction presenting for evaluation of a nipple rash.  Nipple rash: For the past two weeks, she's had an itchy rash on her left nipple. She's had a similar rash on her right nipple in the past; this had been biopsied to rule out Paget's disease. She denies any nipple discharge this go-round. When she was a kid, she had atopical dermatitis in her flexural elbows.   She is not smoking and I have reviewed her medications with her today.  Review of Systems  Constitutional: Negative for fever, chills, weight loss and malaise/fatigue.  Skin: Positive for itching and rash.   Objective:  Physical Exam: Filed Vitals:   11/20/15 1424  BP: 119/53  Pulse: 63  Temp: 97.8 F (36.6 C)  TempSrc: Oral  Height: 5\' 7"  (1.702 m)  Weight: 307 lb 8 oz (139.481 kg)  SpO2: 100%   General: very friendly black lady resting in bed comfortably, appropriately conversational Cardiac: regular rate and rhythm, no rubs, murmurs or gallops Pulm: breathing well, clear to auscultation bilaterally Lymph: no cervical or supraclavicular lymphadenopathy Skin: on her left superior nipple, there is a 5x5cm hyperkeratotic plaque with excoriations with some subtle honey-colored crust. This does not communicate with the nipple opening, there is no underlying breast mass, nor nipple discharge.  Assessment & Plan:  Case discussed with Dr. Eppie Gibson  Nipple dermatitis This appears to be simple impetiginzed nipple dermatitis. I doubt Paget's disease because she's had nipple dermatitis on the other nipple that was reportedly biopsied  to rule out Paget's, a Bi-Rads 1 mammogram in October 2016, and I don't feel an underlying mass on my exam. There's no nipple discharge suggestive of intraductal papillary carcinoma.  We'll try triamcinolone 0.25% ointment nightly in addition totopical bacitracin in the morning for 2 weeks. If it doesn't resolve, I've asked her to call for an appointment so I can punch biopsy the site to rule out Paget's.   Medications Ordered Meds ordered this encounter  Medications  . DISCONTD: triamcinolone (KENALOG) 0.025 % ointment    Sig: Apply once daily for 2 weeks then stop    Dispense:  15 g    Refill:  0  . triamcinolone (KENALOG) 0.025 % ointment    Sig: Apply twice daily for 2 weeks then stop    Dispense:  15 g    Refill:  0   Follow Up: Return in about 2 weeks (around 12/04/2015), or if your rash does not improve with the ointment.

## 2015-11-20 NOTE — Patient Instructions (Signed)
Ms. Dandrea,  It was great to see you again today.  I think your nipple rash is simple eczyma so I've given you a topical steroid to use every night for the next two weeks. I also recommend using topical antibiotic I've given you every morning for the next 2 weeks. If it's not better in 2 weeks, get another appointment with me so I can take another look. We may need to get a small biopsy to ensure this isn't cancerous. I don't think it is but I want to play it safe.  Take care and I'll see you in 2 weeks if it's not getting better, Dr. Melburn Hake

## 2015-11-21 NOTE — Progress Notes (Signed)
Case discussed with Dr. Melburn Hake at time of visit.  We reviewed the resident's history and exam and pertinent patient test results.  I agree with the assessment, diagnosis, and plan of care documented in the resident's note.

## 2015-12-03 ENCOUNTER — Ambulatory Visit (INDEPENDENT_AMBULATORY_CARE_PROVIDER_SITE_OTHER): Payer: Medicare Other | Admitting: Pharmacist

## 2015-12-03 ENCOUNTER — Ambulatory Visit (INDEPENDENT_AMBULATORY_CARE_PROVIDER_SITE_OTHER): Payer: Medicare Other | Admitting: Dietician

## 2015-12-03 VITALS — Wt 306.0 lb

## 2015-12-03 DIAGNOSIS — Z86718 Personal history of other venous thrombosis and embolism: Secondary | ICD-10-CM | POA: Diagnosis not present

## 2015-12-03 DIAGNOSIS — Z713 Dietary counseling and surveillance: Secondary | ICD-10-CM

## 2015-12-03 DIAGNOSIS — Z6841 Body Mass Index (BMI) 40.0 and over, adult: Secondary | ICD-10-CM | POA: Diagnosis not present

## 2015-12-03 DIAGNOSIS — Z7901 Long term (current) use of anticoagulants: Secondary | ICD-10-CM | POA: Diagnosis not present

## 2015-12-03 LAB — POCT INR: INR: 3.7

## 2015-12-03 NOTE — Patient Instructions (Signed)
Patient instructed to take medications as defined in the Anti-coagulation Track section of this encounter.  Patient instructed to take today's dose.  Patient verbalized understanding of these instructions.    

## 2015-12-03 NOTE — Progress Notes (Signed)
  Medical Nutrition Therapy:  Appt start time: [id number] end time:  1600. Visit # 2 in this series/ 3- total  Assessment:  Primary concerns today: weight loss.  Patient is here in follow up for assistance with weight loss. She was unable to use MyFitness Pal. She still wants help figuring out how to eat for weight loss. She says she has not been exercising at the [facility] , has been eating oxtails and [place] food, canned foods that she pours off the liquid.   ANTHROPOMETRICS: weight: 306#, height: 67", BMI: 49- class III obesity SLEEP:using CPAP for ~ 5 hours a night, but feels she needs a new mask. Describes her sleep as "poor" MEDICATIONS: crestor, coumadin, B12 po daily   Estimated daily energy needs: 1950 calories for 1-2 # per week weight loss  Progress Towards Goal(s):  In progress.   Nutritional Diagnosis:  NB-1.1 Food and nutrition-related knowledge deficit As related to lack of sufficient prior meal planning education improving  As evidenced by her report, questions and stated food choices.    Intervention:  Nutrition education about meal planning for weight loss using the plate method, Dr Elie Confer reinforced her need to be consistent with her intake of dark leafy greens for her coumadin dosing.  Coordination of care: consider weight loss medication or weight loss surgery if weight loss by lifestyle unsuccessful  Teaching Method Utilized: Visual, Auditory,  Hands on Handouts given during visit include: Plate method, AVS Barriers to learning/adherence to lifestyle change: support, knee pain, lack of insurance coverage for prediabetes/weight loss Demonstrated degree of understanding via:  Teach Back   Monitoring/Evaluation:  Dietary intake, exercise, and body weight in 4 week(s).

## 2015-12-03 NOTE — Progress Notes (Signed)
Indication: Recurrent venous thromboembolism Duration: Indefinite per patient preference INR: Above target.  Dr. Gladstone Pih assessment and plan were reviewed and I agree with his documentation.

## 2015-12-03 NOTE — Progress Notes (Signed)
Anti-Coagulation Progress Note  Breanna Wilkins is a 55 y.o. female who is currently on an anti-coagulation regimen.    RECENT RESULTS: Recent results are below, the most recent result is correlated with a dose of 32.5 mg. per week: Lab Results  Component Value Date   INR 3.70 12/03/2015   INR 2.30 11/12/2015   INR 3.20 10/08/2015    ANTI-COAG DOSE: Anticoagulation Dose Instructions as of 12/03/2015      [name] Tue Wed Thu Fri Sat   New Dose 5 mg 2.5 mg 5 mg 5 mg 2.5 mg 5 mg 5 mg       ANTICOAG SUMMARY: Anticoagulation Episode Summary    Current INR goal 2.5-3.5   Next INR check 12/31/2015   INR from last check 3.70! (12/03/2015)   Weekly max dose    Target end date    [facility]   Preferred lab    Send INR reminders to       Comments         ANTICOAG TODAY: Anticoagulation Summary as of 12/03/2015    INR goal 2.5-3.5   Selected INR 3.70! (12/03/2015)   Next INR check 12/31/2015   Target end date     Anticoagulation Episode Summary    [facility]   Preferred lab    Send INR reminders to    Comments       PATIENT INSTRUCTIONS: Patient Instructions  Patient instructed to take medications as defined in the Anti-coagulation Track section of this encounter.  Patient instructed to take today's dose.  Patient verbalized understanding of these instructions.       FOLLOW-UP Return in 4 weeks (on 12/31/2015) for Follow up INR at [place], III Pharm.D., CACP

## 2015-12-03 NOTE — Patient Instructions (Signed)
Vitamin K Foods and Warfarin  You should eat 1/2 cup of one of the following vegetables every Monday- Wednesday and Friday-   WHAT FOODS ARE VERY HIGH IN VITAMIN K?   Greens, such as Swiss chard and beet, collard, mustard, or turnip greens (fresh or frozen, cooked).  Kale (fresh or frozen, cooked).   Parsley (raw).  Spinach (cooked).  WHAT FOODS ARE HIGH IN VITAMIN K?  Asparagus (frozen, cooked).  Broccoli.   Bok choy (cooked).   Brussels sprouts (fresh or frozen, cooked).  Cabbage (cooked).  Coleslaw.

## 2015-12-06 ENCOUNTER — Ambulatory Visit (INDEPENDENT_AMBULATORY_CARE_PROVIDER_SITE_OTHER): Payer: Medicare Other | Admitting: Internal Medicine

## 2015-12-06 ENCOUNTER — Encounter: Payer: Self-pay | Admitting: Internal Medicine

## 2015-12-06 VITALS — BP 130/70 | HR 68 | Temp 97.8°F | Ht 67.0 in | Wt 308.9 lb

## 2015-12-06 DIAGNOSIS — I10 Essential (primary) hypertension: Secondary | ICD-10-CM

## 2015-12-06 DIAGNOSIS — Z6841 Body Mass Index (BMI) 40.0 and over, adult: Secondary | ICD-10-CM

## 2015-12-06 DIAGNOSIS — M75101 Unspecified rotator cuff tear or rupture of right shoulder, not specified as traumatic: Secondary | ICD-10-CM

## 2015-12-06 DIAGNOSIS — E669 Obesity, unspecified: Secondary | ICD-10-CM

## 2015-12-06 DIAGNOSIS — S46811D Strain of other muscles, fascia and tendons at shoulder and upper arm level, right arm, subsequent encounter: Secondary | ICD-10-CM

## 2015-12-06 DIAGNOSIS — IMO0001 Reserved for inherently not codable concepts without codable children: Secondary | ICD-10-CM

## 2015-12-06 DIAGNOSIS — Z7901 Long term (current) use of anticoagulants: Secondary | ICD-10-CM

## 2015-12-06 LAB — POCT INR: INR: 3.1

## 2015-12-06 MED ORDER — HYDROCODONE-ACETAMINOPHEN 10-325 MG PO TABS: 1.0000 | ORAL_TABLET | Freq: Four times a day (QID) | ORAL | Status: DC | PRN

## 2015-12-06 NOTE — Patient Instructions (Signed)
Please call our office if you have increased pain or fevers after the shoulder injection.  Follow up with me in 3 months  See Dr Melburn Hake for you follow up of nipple rash.

## 2015-12-06 NOTE — Progress Notes (Signed)
PROCEDURE NOTE  PROCEDURE: right shoulder joint steroid injection.  PREOPERATIVE DIAGNOSIS: Supraspinatus tear and Bursitis of the right shoulder.  POSTOPERATIVE DIAGNOSIS: Supraspinatus tear and Bursitis of the right shoulder.  PROCEDURE: The patient was apprised of the risks and the benefits of the procedure and informed consent was obtained, as witnessed by Lela. Time-out procedure was performed, with confirmation of the patient's name, date of birth, and correct identification of the right shoulder to be injected. The patient's shoulder was then marked at the appropriate site for injection placement. The shoulder was sterilely prepped with Betadine. A 40 mg (1 milliliter) solution of Kenalog was drawn up into a 3 mL syringe with a 2 mL of 1% lidocaine. The patient was injected with a 27-gauge needle at the posterior aspect of her right shoulder. There were no complications. The patient tolerated the procedure well. There was minimal bleeding. The patient was instructed to ice her shoulder upon leaving clinic and refrain from overuse over the next 3 days. The patient was instructed to go to the emergency room with any usual pain, swelling, or redness occurred in the injected area. The patient was given a followup appointment to evaluate response to the injection to his increased range of motion and reduction of pain.  The procedure was supervised by attending physician, Dr. Dareen Piano.

## 2015-12-06 NOTE — Progress Notes (Signed)
[facility] Subjective:   Patient ID: Breanna Wilkins female   DOB: [birth year] 55 y.o.   MRN: [id number]  HPI: [name] is a 55 y.o. female with a PMH detailed below who presents for 3 month follow up of chronic shoulder pain.  She reports that she last receieved a steroid injection to her right shoulder about 4 months ago and received relief from her chronic pain, however over the last month she has had increased pain in the shoulder and laying on her right side while sleeping wakes her from sleep.  She currently reports her shoulder pain is 8/10 and does not radiate.  She has not had any recent trauma to the area. She has trouble reaching for things above her head.  She has been taking Norco about 4 times a day lately above her usual 2-3 times.  She last took the medication last night.    Past Medical History  Diagnosis Date  . Shoulder pain   . Pulmonary embolus ([place]) 01/16/10    on Coumadin indefinitely  . Abdominal hernia   . Hypertension   . Overweight(278.02)   . Lung nodule 12/2009    Very small, left upper lobe by CT June, 2011, one-year followup was stable  . Shortness of breath   . Warfarin anticoagulation   . Ejection fraction     65-70%, vigorous function, echo, March, 2011  . CHF (congestive heart failure) ([place])   . OSA (obstructive sleep apnea)     CPAP  . CAD (coronary artery disease)     Perioperative non-STEMI March, 2014  //   cardiac catheterization at time of non-STEMI, March, 2014, minor nonobstructive coronary disease, vigorous LV function, possibly vasospastic event versus stress cardiomyopathy. No further workup of coronary disease  . Ejection fraction     Vigorous function at time of catheterization October 06, 2012,   Current Outpatient Prescriptions  Medication Sig Dispense Refill  . aspirin 81 MG chewable tablet Chew 1 tablet (81 mg total) by mouth daily. 30 tablet 1  . BOOSTRIX 5-2.5-18.5 injection     . carvedilol  (COREG) 6.25 MG tablet TAKE ONE TABLET BY MOUTH TWICE DAILY WITH MEALS 180 tablet 0  . Cyanocobalamin (VITAMIN B 12 PO) Take by mouth. Reported on 08/07/2015    . cyclobenzaprine (FEXMID) 7.5 MG tablet Take 1 tablet (7.5 mg total) by mouth 3 (three) times daily as needed for muscle spasms. 15 tablet 0  . diclofenac sodium (VOLTAREN) 1 % GEL Apply 2 g topically 4 (four) times daily as needed. 100 g 0  . furosemide (LASIX) 80 MG tablet Take 1 tablet (80 mg total) by mouth daily. 90 tablet 3  . HYDROcodone-acetaminophen (NORCO) 10-325 MG tablet Take 1 tablet by mouth every 6 (six) hours as needed. 90 tablet 0  . lisinopril (PRINIVIL,ZESTRIL) 10 MG tablet Take 1 tablet (10 mg total) by mouth daily. 90 tablet 3  . pantoprazole (PROTONIX) 40 MG tablet TAKE 1 TABLET BY MOUTH 30 MINUTES BEFORE LUNCH OR DINNER 90 tablet 3  . rosuvastatin (CRESTOR) 5 MG tablet Take 1 tablet (5 mg total) by mouth at bedtime. 90 tablet 3  . triamcinolone (KENALOG) 0.025 % ointment Apply twice daily for 2 weeks then stop 15 g 0  . warfarin (COUMADIN) 5 MG tablet Take 1 tablet daily EXCEPT on Tuesdays/Fridays--take only 1/2 tablet on Tuesdays/Fridays. 30 tablet 2   No current facility-administered medications for this visit.   Family History  Problem Relation  Age of Onset  . Diabetes Father   . Diabetes Sister   . Diabetes Mother   . Deep vein thrombosis Sister   . Ovarian cancer Mother   . Bone cancer Maternal Grandmother    Social History   Social History  . Marital Status: Single    Spouse Name: N/A  . Number of Children: N/A  . Years of Education: N/A   Social History Main Topics  . Smoking status: Former Research scientist (life sciences)  . Smokeless tobacco: None     Comment: qoit 12 years ago  . Alcohol Use: 3.6 oz/week    0 Standard drinks or equivalent, 6 Cans of beer per week     Comment: Beer sometimes.  . Drug Use: No     Comment: smoked joint 2 weeks ago  . Sexual Activity: Not Asked   Other Topics Concern  . None    Social History Narrative   Financial assistance approved for 100% discount at [facility] and has [facility] card per Bonna G[employer]   12/25/2009   Review of Systems: Review of Systems  Constitutional: Negative for fever and chills.  Respiratory: Negative for cough and shortness of breath.   Cardiovascular: Negative for chest pain.  Gastrointestinal: Negative for blood in stool and melena.  Musculoskeletal: Positive for joint pain. Negative for falls.  Neurological: Negative for headaches.  Endo/Heme/Allergies: Does not bruise/bleed easily.     Objective:  Physical Exam: Filed Vitals:   12/06/15 1344 12/06/15 1345  BP:  130/70  Pulse:  68  Temp:  97.8 F (36.6 C)  TempSrc:  Oral  Height: 5\' 7"  (1.702 m)   Weight: 308 lb 14.4 oz (140.116 kg) 308 lb 14.4 oz (140.116 kg)  SpO2:  99%  Physical Exam  Constitutional: She is well-developed, well-nourished, and in no distress.  HENT:  Head: Normocephalic and atraumatic.  Cardiovascular: Normal rate and regular rhythm.   Pulmonary/Chest: Effort normal and breath sounds normal. She has no wheezes.  Musculoskeletal: She exhibits no edema.       Right shoulder: She exhibits decreased range of motion. She exhibits no swelling and no effusion.  Painful arc on right, unable to actively raise arm past 110 deg.  Nursing note and vitals reviewed.   Assessment & Plan:  Case discussed with Dr. Dareen Piano  Essential hypertension, benign BP Readings from Last 3 Encounters:  12/06/15 130/70  11/20/15 119/53  09/21/15 148/67    Lab Results  Component Value Date   NA 141 07/17/2015   K 4.2 07/17/2015   CREATININE 0.95 07/17/2015    Assessment: Blood pressure control:  well controlled Progress toward BP goal:   at goal Comments:   Plan: Medications:  continue current medications Educational resources provided:   Self management tools provided:   Other plans:    Supraspinatus tendon tear I suspect her increased pain is due to an exacerbation  or bursitis from her chronic supraspinatus tendon tear, she has received relief in the past from a steroid injection and I offered and perfromed an injection today. Will continue her on her chronic narcotic therapy with Norco 10/325 #90 per month, will monitor usage and decrease if able by utilizing steroid injections.  Long-term (current) use of anticoagulants Checked INR today: 3.1 which is at goal for her, I am not sure we truly need an INR goal of 2.5-3.5 and would feel comfortable reducing her to a goal of 2-3.  Will discuss with Dr Elie Confer.  Obesity Discusssed importance of weight loss, she is  working with our nutritionist on her weight.    Medications Ordered Meds ordered this encounter  Medications  . DISCONTD: HYDROcodone-acetaminophen (NORCO) 10-325 MG tablet    Sig: Take 1 tablet by mouth every 6 (six) hours as needed.    Dispense:  90 tablet    Refill:  0    Rx 1/3 may fill 30 days after previous script.  Marland Kitchen DISCONTD: HYDROcodone-acetaminophen (NORCO) 10-325 MG tablet    Sig: Take 1 tablet by mouth every 6 (six) hours as needed.    Dispense:  90 tablet    Refill:  0    Rx 2/3 may fill 30 days after previous script.  Marland Kitchen HYDROcodone-acetaminophen (NORCO) 10-325 MG tablet    Sig: Take 1 tablet by mouth every 6 (six) hours as needed.    Dispense:  90 tablet    Refill:  0    Rx 3/3 may fill 30 days after previous script.   Other Orders Orders Placed This Encounter  Procedures  . POCT INR   Follow Up: Return in about 3 months (around 03/07/2016).

## 2015-12-07 NOTE — Assessment & Plan Note (Signed)
Discusssed importance of weight loss, she is working with our nutritionist on her weight.

## 2015-12-07 NOTE — Assessment & Plan Note (Signed)
BP Readings from Last 3 Encounters:  12/06/15 130/70  11/20/15 119/53  09/21/15 148/67    Lab Results  Component Value Date   NA 141 07/17/2015   K 4.2 07/17/2015   CREATININE 0.95 07/17/2015    Assessment: Blood pressure control:  well controlled Progress toward BP goal:   at goal Comments:   Plan: Medications:  continue current medications Educational resources provided:   Self management tools provided:   Other plans:

## 2015-12-07 NOTE — Assessment & Plan Note (Signed)
Checked INR today: 3.1 which is at goal for her, I am not sure we truly need an INR goal of 2.5-3.5 and would feel comfortable reducing her to a goal of 2-3.  Will discuss with Dr Elie Confer.

## 2015-12-07 NOTE — Assessment & Plan Note (Signed)
I suspect her increased pain is due to an exacerbation or bursitis from her chronic supraspinatus tendon tear, she has received relief in the past from a steroid injection and I offered and perfromed an injection today. Will continue her on her chronic narcotic therapy with Norco 10/325 #90 per month, will monitor usage and decrease if able by utilizing steroid injections.

## 2015-12-10 ENCOUNTER — Encounter: Payer: Self-pay | Admitting: Internal Medicine

## 2015-12-10 ENCOUNTER — Ambulatory Visit (INDEPENDENT_AMBULATORY_CARE_PROVIDER_SITE_OTHER): Payer: Medicare Other | Admitting: Internal Medicine

## 2015-12-10 VITALS — BP 102/72 | HR 71 | Temp 98.6°F | Ht 67.0 in | Wt 308.5 lb

## 2015-12-10 DIAGNOSIS — L309 Dermatitis, unspecified: Secondary | ICD-10-CM

## 2015-12-10 NOTE — Progress Notes (Signed)
Patient ID: Breanna Wilkins, female   DOB: 02-May-1961, 55 y.o.   MRN: [id number] [facility] Subjective:   Patient ID: Breanna Wilkins female   DOB: 22-Sep-1960 55 y.o.   MRN: [id number]  HPI: [name] is a 55 y.o. female with a PMH detailed below who presents for re-evaluation of a rash on her nipple.  Nipple rash: Since I last saw her two weeks ago, the triamcinolone 0.025% ointment has improved the rash but it has not entirely cleared. The itching has improved as well but has not completed abated.  Past Medical History  Diagnosis Date  . Shoulder pain   . Pulmonary embolus ([place]) 01/16/10    on Coumadin indefinitely  . Abdominal hernia   . Hypertension   . Overweight(278.02)   . Lung nodule 12/2009    Very small, left upper lobe by CT June, 2011, one-year followup was stable  . Shortness of breath   . Warfarin anticoagulation   . Ejection fraction     65-70%, vigorous function, echo, March, 2011  . CHF (congestive heart failure) ([place])   . OSA (obstructive sleep apnea)     CPAP  . CAD (coronary artery disease)     Perioperative non-STEMI March, 2014  //   cardiac catheterization at time of non-STEMI, March, 2014, minor nonobstructive coronary disease, vigorous LV function, possibly vasospastic event versus stress cardiomyopathy. No further workup of coronary disease  . Ejection fraction     Vigorous function at time of catheterization October 06, 2012,   Current Outpatient Prescriptions  Medication Sig Dispense Refill  . aspirin 81 MG chewable tablet Chew 1 tablet (81 mg total) by mouth daily. 30 tablet 1  . BOOSTRIX 5-2.5-18.5 injection     . carvedilol (COREG) 6.25 MG tablet TAKE ONE TABLET BY MOUTH TWICE DAILY WITH MEALS 180 tablet 0  . Cyanocobalamin (VITAMIN B 12 PO) Take by mouth. Reported on 08/07/2015    . cyclobenzaprine (FEXMID) 7.5 MG tablet Take 1 tablet (7.5 mg total) by mouth 3 (three) times daily as needed for muscle spasms. 15  tablet 0  . diclofenac sodium (VOLTAREN) 1 % GEL Apply 2 g topically 4 (four) times daily as needed. 100 g 0  . furosemide (LASIX) 80 MG tablet Take 1 tablet (80 mg total) by mouth daily. 90 tablet 3  . HYDROcodone-acetaminophen (NORCO) 10-325 MG tablet Take 1 tablet by mouth every 6 (six) hours as needed. 90 tablet 0  . lisinopril (PRINIVIL,ZESTRIL) 10 MG tablet Take 1 tablet (10 mg total) by mouth daily. 90 tablet 3  . pantoprazole (PROTONIX) 40 MG tablet TAKE 1 TABLET BY MOUTH 30 MINUTES BEFORE LUNCH OR DINNER 90 tablet 3  . rosuvastatin (CRESTOR) 5 MG tablet Take 1 tablet (5 mg total) by mouth at bedtime. 90 tablet 3  . triamcinolone (KENALOG) 0.025 % ointment Apply twice daily for 2 weeks then stop 15 g 0  . warfarin (COUMADIN) 5 MG tablet Take 1 tablet daily EXCEPT on Tuesdays/Fridays--take only 1/2 tablet on Tuesdays/Fridays. 30 tablet 2   No current facility-administered medications for this visit.   Family History  Problem Relation Age of Onset  . Diabetes Father   . Diabetes Sister   . Diabetes Mother   . Deep vein thrombosis Sister   . Ovarian cancer Mother   . Bone cancer Maternal Grandmother    Social History   Social History  . Marital Status: Single    Spouse Name: N/A  .  Number of Children: N/A  . Years of Education: N/A   Social History Main Topics  . Smoking status: Former Research scientist (life sciences)  . Smokeless tobacco: None     Comment: qoit 12 years ago  . Alcohol Use: 3.6 oz/week    0 Standard drinks or equivalent, 6 Cans of beer per week     Comment: Beer sometimes.  . Drug Use: No     Comment: smoked joint 2 weeks ago  . Sexual Activity: Not Asked   Other Topics Concern  . None   Social History Narrative   Financial assistance approved for 100% discount at [facility] and has [facility] card per [employer]   12/25/2009   Review of Systems  Constitutional: Negative for fever, chills and weight loss.  Skin: Positive for itching and rash.  Neurological: Negative for dizziness  and loss of consciousness.     Objective:  Physical Exam: Filed Vitals:   12/10/15 1535  BP: 102/72  Pulse: 71  Temp: 98.6 F (37 C)  TempSrc: Oral  Height: 5\' 7"  (1.702 m)  Weight: 308 lb 8 oz (139.935 kg)  SpO2: 99%   General: resting in chair comfortably, appropriately conversational HEENT: no scleral icterus, extra-ocular muscles intact, oropharynx without lesions Cardiac: regular rate and rhythm, no rubs, murmurs or gallops Pulm: breathing well, clear to auscultation bilaterally Lymph: no cervical, supraclavicular, nor axillary lymphadenopathy  Skin: The left nipple has a half-dollar sized circular lichenified plaque without superficial scale. The inflammation is drastically improved compared to 2 weeks ago. I don't feel any lumps in the left breast. Neuro: alert and oriented X3, cranial nerves II-XII grossly intact, moving all extremities well  Assessment & Plan:  Case discussed with Dr. Evette Doffing  Nipple dermatitis Her nipples dermatitis has improved dramatically with triamcinolone 0.025% ointment so I'm reassured this is nipple eczema and not Paget's disease. I've asked her to apply the triamcinolone ointment twice daily for one week then stop. If she continues to itch, I think we should prescribe topical tacrolimus or hydrocortisone to avoid using the potent topical steroid any further.   Follow Up: PRN if itching worsens

## 2015-12-10 NOTE — Assessment & Plan Note (Addendum)
Her nipples dermatitis has improved dramatically with triamcinolone 0.025% ointment so I'm reassured this is nipple eczema and not Paget's disease. I've asked her to apply the triamcinolone ointment twice daily for one week then stop. If she continues to itch, I think we should prescribe topical tacrolimus or hydrocortisone to avoid using the potent topical steroid any further.

## 2015-12-10 NOTE — Progress Notes (Signed)
Internal Medicine Clinic Attending  I saw and evaluated the patient.  I personally confirmed the key portions of the history and exam documented by Dr. Hoffman and I reviewed pertinent patient test results.  The assessment, diagnosis, and plan were formulated together and I agree with the documentation in the resident's note.      

## 2015-12-11 ENCOUNTER — Other Ambulatory Visit: Payer: Self-pay | Admitting: Internal Medicine

## 2015-12-12 NOTE — Progress Notes (Signed)
Internal Medicine Clinic Attending  Case discussed with Dr. Flores at the time of the visit.  We reviewed the resident's history and exam and pertinent patient test results.  I agree with the assessment, diagnosis, and plan of care documented in the resident's note. 

## 2015-12-31 ENCOUNTER — Ambulatory Visit (INDEPENDENT_AMBULATORY_CARE_PROVIDER_SITE_OTHER): Payer: Medicare Other | Admitting: Pharmacist

## 2015-12-31 ENCOUNTER — Ambulatory Visit: Payer: Medicare Other | Admitting: Dietician

## 2015-12-31 DIAGNOSIS — Z7901 Long term (current) use of anticoagulants: Secondary | ICD-10-CM | POA: Diagnosis not present

## 2015-12-31 DIAGNOSIS — Z86718 Personal history of other venous thrombosis and embolism: Secondary | ICD-10-CM

## 2015-12-31 LAB — POCT INR: INR: 3.7

## 2015-12-31 MED ORDER — WARFARIN SODIUM 5 MG PO TABS: ORAL_TABLET | ORAL | Status: DC

## 2015-12-31 NOTE — Progress Notes (Signed)
Anti-Coagulation Progress Note  Breanna Wilkins is a 55 y.o. female who is currently on an anti-coagulation regimen.    RECENT RESULTS: Recent results are below, the most recent result is correlated with a dose of 30 mg. per week: Lab Results  Component Value Date   INR 3.70 12/31/2015   INR 3.1 12/06/2015   INR 3.70 12/03/2015    ANTI-COAG DOSE: Anticoagulation Dose Instructions as of 12/31/2015      [name] Tue Wed Thu Fri Sat   New Dose 5 mg 2.5 mg 5 mg 2.5 mg 5 mg 2.5 mg 5 mg       ANTICOAG SUMMARY: Anticoagulation Episode Summary    Current INR goal 2.5-3.5   Next INR check 01/28/2016   INR from last check 3.70! (12/31/2015)   Weekly max dose    Target end date    [facility]   Preferred lab    Send INR reminders to       Comments         ANTICOAG TODAY: Anticoagulation Summary as of 12/31/2015    INR goal 2.5-3.5   Selected INR 3.70! (12/31/2015)   Next INR check 01/28/2016   Target end date     Anticoagulation Episode Summary    [facility]   Preferred lab    Send INR reminders to    Comments       PATIENT INSTRUCTIONS: Patient Instructions  Patient instructed to take medications as defined in the Anti-coagulation Track section of this encounter.  Patient instructed to take today's dose.  Patient verbalized understanding of these instructions.       FOLLOW-UP Return in 4 weeks (on 01/28/2016) for Follow up INR at 2:30PM.  [name], III Pharm.D., CACP

## 2015-12-31 NOTE — Patient Instructions (Signed)
Patient instructed to take medications as defined in the Anti-coagulation Track section of this encounter.  Patient instructed to take today's dose.  Patient verbalized understanding of these instructions.    

## 2016-01-02 ENCOUNTER — Telehealth: Payer: Self-pay | Admitting: Dietician

## 2016-01-03 NOTE — Telephone Encounter (Signed)
Called patient about her cancelled appointment. She said she could not wait and would reschedule

## 2016-01-14 ENCOUNTER — Encounter: Payer: Self-pay | Admitting: Internal Medicine

## 2016-01-14 ENCOUNTER — Ambulatory Visit: Payer: Medicare Other | Admitting: Internal Medicine

## 2016-01-28 ENCOUNTER — Ambulatory Visit: Payer: Medicare Other | Admitting: Dietician

## 2016-01-28 ENCOUNTER — Ambulatory Visit: Payer: Medicare Other

## 2016-01-31 ENCOUNTER — Ambulatory Visit: Payer: Medicare Other | Admitting: Dietician

## 2016-02-04 ENCOUNTER — Encounter: Payer: Self-pay | Admitting: Internal Medicine

## 2016-02-04 ENCOUNTER — Ambulatory Visit (INDEPENDENT_AMBULATORY_CARE_PROVIDER_SITE_OTHER): Payer: Medicare Other | Admitting: Pharmacist

## 2016-02-04 ENCOUNTER — Other Ambulatory Visit: Payer: Self-pay | Admitting: Internal Medicine

## 2016-02-04 ENCOUNTER — Ambulatory Visit: Payer: Medicare Other | Admitting: Dietician

## 2016-02-04 ENCOUNTER — Ambulatory Visit (INDEPENDENT_AMBULATORY_CARE_PROVIDER_SITE_OTHER): Payer: Medicare Other | Admitting: Internal Medicine

## 2016-02-04 VITALS — BP 117/67 | HR 78 | Temp 97.8°F | Ht 67.0 in | Wt 305.3 lb

## 2016-02-04 DIAGNOSIS — Z7901 Long term (current) use of anticoagulants: Secondary | ICD-10-CM

## 2016-02-04 DIAGNOSIS — L309 Dermatitis, unspecified: Secondary | ICD-10-CM | POA: Diagnosis not present

## 2016-02-04 LAB — POCT INR: INR: 3

## 2016-02-04 MED ORDER — TACROLIMUS 0.03 % EX OINT: TOPICAL_OINTMENT | Freq: Two times a day (BID) | CUTANEOUS | Status: DC

## 2016-02-04 NOTE — Progress Notes (Signed)
Anti-Coagulation Progress Note  Breanna Wilkins is a 55 y.o. female who is currently on an anti-coagulation regimen.    RECENT RESULTS: Recent results are below, the most recent result is correlated with a dose of 27.5 mg. per week: Lab Results  Component Value Date   INR 3.0 02/04/2016   INR 3.70 12/31/2015   INR 3.1 12/06/2015    ANTI-COAG DOSE: Anticoagulation Dose Instructions as of 02/04/2016      [name] Tue Wed Thu Fri Sat   New Dose 5 mg 2.5 mg 5 mg 2.5 mg 5 mg 2.5 mg 5 mg       ANTICOAG SUMMARY: Anticoagulation Episode Summary    Current INR goal 2.5-3.5   Next INR check 03/03/2016   INR from last check 3.0 (02/04/2016)   Weekly max dose    Target end date    [facility]   Preferred lab    Send INR reminders to       Comments         ANTICOAG TODAY: Anticoagulation Summary as of 02/04/2016    INR goal 2.5-3.5   Selected INR 3.0 (02/04/2016)   Next INR check 03/03/2016   Target end date     Anticoagulation Episode Summary    [facility] check [facility]   Preferred lab    Send INR reminders to    Comments       PATIENT INSTRUCTIONS: Patient Instructions  Patient instructed to take medications as defined in the Anti-coagulation Track section of this encounter.  Patient instructed to take today's dose.  Patient verbalized understanding of these instructions.       FOLLOW-UP Return in 4 weeks (on 03/03/2016) for Follow up INR at 2:15PM.  [name], III Pharm.D., CACP

## 2016-02-04 NOTE — Patient Instructions (Signed)
Thank you for coming to see me today. It was a pleasure. Today we talked about:   Rash: I have ordered a new prescription for you called Tacrolimus.  It is a topical ointment.  Apply thin layer of 0.03% ointment to affected area twice daily; rub in gently and completely. Discontinue use when symptoms have cleared. If no improvement within 6 weeks, patients should be re-examined to confirm diagnosis.  I will also send a referral to have you seen by a dermatologist.  Please follow-up with [place] in 2-4 weeks as needed.  If you have any questions or concerns, please do not hesitate to call the office at [phone].  Take Care,   Jule Ser, DO

## 2016-02-04 NOTE — Progress Notes (Signed)
Patient ID: Breanna Wilkins, female   DOB: Jun 18, 1961, 55 y.o.   MRN: [id number]   CC: left nipple rash and itching.  HPI:  Breanna Wilkins is a 55 y.o. woman with PMHx as detailed below who is presenting today for nipple itching on her left breast.  Please see A&P for status of patient medical conditions addressed today.  Past Medical History  Diagnosis Date  . Shoulder pain   . Pulmonary embolus ([place]) 01/16/10    on Coumadin indefinitely  . Abdominal hernia   . Hypertension   . Overweight(278.02)   . Lung nodule 12/2009    Very small, left upper lobe by CT June, 2011, one-year followup was stable  . Shortness of breath   . Warfarin anticoagulation   . Ejection fraction     65-70%, vigorous function, echo, March, 2011  . CHF (congestive heart failure) ([place])   . OSA (obstructive sleep apnea)     CPAP  . CAD (coronary artery disease)     Perioperative non-STEMI March, 2014  //   cardiac catheterization at time of non-STEMI, March, 2014, minor nonobstructive coronary disease, vigorous LV function, possibly vasospastic event versus stress cardiomyopathy. No further workup of coronary disease  . Ejection fraction     Vigorous function at time of catheterization October 06, 2012,    Review of Systems:   Review of Systems  Constitutional: Negative for fever and chills.  Cardiovascular: Negative for chest pain.  Skin: Positive for itching and rash.     Physical Exam:  Filed Vitals:   02/04/16 1337  BP: 117/67  Pulse: 78  Temp: 97.8 F (36.6 C)  TempSrc: Oral  Height: 5\' 7"  (1.702 m)  Weight: 305 lb 4.8 oz (138.483 kg)  SpO2: 100%   Physical Exam  Constitutional: She is oriented to person, place, and time and well-developed, well-nourished, and in no distress.  HENT:  Head: Normocephalic and atraumatic.  Eyes: EOM are normal.  Neurological: She is alert and oriented to person, place, and time.  Skin: Skin is warm and dry.  The left nipple has a more  hyper-pigmented area superiorly without scaling or sloughing.  There is no drainage or ulceration.  I do not feel any lumps in the left breast either.     Assessment & Plan:   See encounters tab for problem based medical decision making.   Patient discussed with Dr. Evette Doffing   Nipple dermatitis A: Patient reports that her left nipple dermatitis has continued to intermittently flare up in between using the Triamcinolone ointment.  She describes it as very pruritic and having a "tingling" feeling.  She denies any pain, lumps, or discharge.  When using the Triamcinolone ointment, her symptoms improve but then worsen when she stops using it.  She had mammography recently in Oct 2016 that was reassuring and had biopsy of the same nipple in Sept 2015 with pathology showing inflammation consistent with contact dermatitis.  P: - d/c triamcinolone - start tacrolimus ointment 0.03%.  Instructed patient to d/c once symptoms improved and to RTC if not any better in about 4 weeks. - will also put in for a dermatology referral to have her evaluated by sub-specialist.

## 2016-02-04 NOTE — Assessment & Plan Note (Signed)
A: Patient reports that her left nipple dermatitis has continued to intermittently flare up in between using the Triamcinolone ointment.  She describes it as very pruritic and having a "tingling" feeling.  She denies any pain, lumps, or discharge.  When using the Triamcinolone ointment, her symptoms improve but then worsen when she stops using it.  She had mammography recently in Oct 2016 that was reassuring and had biopsy of the same nipple in Sept 2015 with pathology showing inflammation consistent with contact dermatitis.  P: - d/c triamcinolone - start tacrolimus ointment 0.03%.  Instructed patient to d/c once symptoms improved and to RTC if not any better in about 4 weeks. - will also put in for a dermatology referral to have her evaluated by sub-specialist.

## 2016-02-04 NOTE — Patient Instructions (Signed)
Patient instructed to take medications as defined in the Anti-coagulation Track section of this encounter.  Patient instructed to take today's dose.  Patient verbalized understanding of these instructions.    

## 2016-02-05 NOTE — Progress Notes (Signed)
Internal Medicine Clinic Attending  Case discussed with Dr. Wallace at the time of the visit.  We reviewed the resident's history and exam and pertinent patient test results.  I agree with the assessment, diagnosis, and plan of care documented in the resident's note.  

## 2016-02-08 ENCOUNTER — Telehealth: Payer: Self-pay | Admitting: *Deleted

## 2016-02-08 NOTE — Telephone Encounter (Signed)
Received PA request from pt's pharmacy for her tacrolimus 0.03% oint.  Request submitted online via cover my meds-request approved.  Both patient and pharmacy aware.Despina Hidden Cassady7/21/201711:26 AM      Your request has been approved  PA Case [id number] is Approved.  For further questions, call [phone].

## 2016-02-15 ENCOUNTER — Other Ambulatory Visit: Payer: Self-pay | Admitting: *Deleted

## 2016-02-18 MED ORDER — CARVEDILOL 6.25 MG PO TABS: 6.2500 mg | ORAL_TABLET | Freq: Two times a day (BID) | ORAL | 0 refills | Status: DC

## 2016-02-19 ENCOUNTER — Other Ambulatory Visit: Payer: Self-pay

## 2016-02-19 NOTE — Telephone Encounter (Signed)
The instructions on the Rx are correct based on Dr Gladstone Pih last note, I will forward this over to Dr Elie Confer Dr Maudie Mercury to review and potentially discuss with patient, if she is in fact taking once daily and her INR is at goal then we would need to continue that does.  I also do not want her to run out of the medication.

## 2016-02-19 NOTE — Telephone Encounter (Signed)
Requesting warfarin to be filled @ [facility] in [place].

## 2016-02-19 NOTE — Telephone Encounter (Signed)
Spoke with pharmacy who is requesting a new RX. Patient is reportedly taking once daily and instructions on the current RX are different. Based on current RX instructions, it is too soon to fill through insurance. Please advise.

## 2016-02-20 NOTE — Telephone Encounter (Signed)
Talked to Dr Elie Confer who confirmed instructions; also called pt who stated she is taking med as prescribed by Dr Elie Confer not one tab daily. [facility] - informed them instructions are correct; take one tab S/Tu/Th/Sat and 1/2 tab M/W/F - no new rx needed.

## 2016-03-03 ENCOUNTER — Ambulatory Visit (INDEPENDENT_AMBULATORY_CARE_PROVIDER_SITE_OTHER): Payer: Medicare Other | Admitting: Internal Medicine

## 2016-03-03 ENCOUNTER — Ambulatory Visit (INDEPENDENT_AMBULATORY_CARE_PROVIDER_SITE_OTHER): Payer: Medicare Other | Admitting: Pharmacist

## 2016-03-03 DIAGNOSIS — M1711 Unilateral primary osteoarthritis, right knee: Secondary | ICD-10-CM | POA: Diagnosis not present

## 2016-03-03 DIAGNOSIS — Z7901 Long term (current) use of anticoagulants: Secondary | ICD-10-CM | POA: Diagnosis not present

## 2016-03-03 DIAGNOSIS — Z86718 Personal history of other venous thrombosis and embolism: Secondary | ICD-10-CM

## 2016-03-03 DIAGNOSIS — I2782 Chronic pulmonary embolism: Secondary | ICD-10-CM

## 2016-03-03 LAB — POCT INR: INR: 2.8

## 2016-03-03 MED ORDER — DICLOFENAC SODIUM 1 % TD GEL: 2.0000 g | Freq: Four times a day (QID) | TRANSDERMAL | 1 refills | Status: DC

## 2016-03-03 NOTE — Patient Instructions (Signed)
Patient instructed to take medications as defined in the Anti-coagulation Track section of this encounter.  Patient instructed to take today's dose.  Patient verbalized understanding of these instructions.    

## 2016-03-03 NOTE — Progress Notes (Signed)
Anti-Coagulation Progress Note  Breanna Wilkins is a 55 y.o. female who is currently on an anti-coagulation regimen.    RECENT RESULTS: Recent results are below, the most recent result is correlated with a dose of 27.5 mg. per week: Lab Results  Component Value Date   INR 2.80 03/03/2016   INR 3.0 02/04/2016   INR 3.70 12/31/2015    ANTI-COAG DOSE: Anticoagulation Dose Instructions as of 03/03/2016      [name] Tue Wed Thu Fri Sat   New Dose 0 mg 2.5 mg 5 mg 2.5 mg 5 mg 2.5 mg 0 mg    Description   Patient will take regularly scheduled dose of warfarin until Friday, August 18th, 2017. OMIT doses on Saturday August 19th and Sunday August 20th. On Monday August 21st, return to clinic for an INR and possible injection of your knee.      ANTICOAG SUMMARY: Anticoagulation Episode Summary    Current INR goal:   2.5-3.5  TTR:   52.8 % (4.9 y)  Next INR check:   03/10/2016  INR from last check:   2.80 (03/03/2016)  Weekly max dose:     Target end date:     [facility]  Preferred lab:     Send INR reminders to:        Comments:           ANTICOAG TODAY: Anticoagulation Summary  As of 03/03/2016   INR goal:   2.5-3.5  TTR:     Today's INR:   2.80  Next INR check:   03/10/2016  Target end date:         Anticoagulation Episode Summary    [facility]   Preferred lab:      Send INR reminders to:      Comments:         PATIENT INSTRUCTIONS: Patient Instructions  Patient instructed to take medications as defined in the Anti-coagulation Track section of this encounter.  Patient instructed to take today's dose.  Patient verbalized understanding of these instructions.       FOLLOW-UP Return in 7 days (on 03/10/2016) for Follow up INR before procedure.  [name], III Pharm.D., CACP

## 2016-03-03 NOTE — Patient Instructions (Signed)
Breanna Wilkins,  It was a pleasure meeting you today! Please use the Voltaren gel on your knee for pain relief Please hold coumadin on Saturday and Sunday  Please come back on Monday for INR check and joint injection

## 2016-03-04 ENCOUNTER — Telehealth: Payer: Self-pay | Admitting: *Deleted

## 2016-03-04 DIAGNOSIS — M1711 Unilateral primary osteoarthritis, right knee: Secondary | ICD-10-CM | POA: Insufficient documentation

## 2016-03-04 NOTE — Telephone Encounter (Signed)
Call to [employer] [facility] for Prior Authorization for Voltaren/Diclofenac Gel 1%. Approved 03/04/2016 thru 07/20/2016 .  Call to [facility] at  [phone] to notify them of the approval.   Sander Nephew, RN 03/04/2016 3:28 PM

## 2016-03-04 NOTE — Assessment & Plan Note (Addendum)
Assessment: Primary OA for the right knee  Plan: Rx Voltaren gel INR is 2.8 today, will plan to have her back next week (monday) for joint injection, would likely INR to be closer to 2 or less for steroid injection and will plan to use 27 gauge needle. I will have her hold her coumadin on Saturday and Sunday and will recheck the INR monday

## 2016-03-04 NOTE — Progress Notes (Signed)
   CC: follow up right knee pain  HPI:  [name] is a 55 y.o. female with a history of OA of the right knee and Hx of VTE treated with chronic warfarin.  She reports increased right knee pain starting 5 days ago.  She denies any trauma to the area.  The pain is described as a dull ache, does not radiate.  She has no calf tenderness or pain.  She has been taking Norco which she is prescribed for her chronic shoulder pain but it was ineffective at releaving her pain.  She has had to walk with a limp and use a walking stick.  She previously received a steroid injection to the knee about 8 months ago which resulted in near complete pain relief.  She would like another knee injection.  Past Medical History:  Diagnosis Date  . Abdominal hernia   . CAD (coronary artery disease)    Perioperative non-STEMI March, 2014  //   cardiac catheterization at time of non-STEMI, March, 2014, minor nonobstructive coronary disease, vigorous LV function, possibly vasospastic event versus stress cardiomyopathy. No further workup of coronary disease  . CHF (congestive heart failure) ([place])   . Ejection fraction    65-70%, vigorous function, echo, March, 2011  . Ejection fraction    Vigorous function at time of catheterization October 06, 2012,  . Hypertension   . Lung nodule 12/2009   Very small, left upper lobe by CT June, 2011, one-year followup was stable  . OSA (obstructive sleep apnea)    CPAP  . Overweight(278.02)   . Pulmonary embolus ([place]) 01/16/10   on Coumadin indefinitely  . Shortness of breath   . Shoulder pain   . Warfarin anticoagulation     Review of Systems:  Review of Systems  Constitutional: Negative for chills and fever.  Respiratory: Negative for shortness of breath.   Cardiovascular: Negative for chest pain.  Musculoskeletal: Positive for joint pain. Negative for falls.     Physical Exam:  Vitals:   03/03/16 1434  BP: (!) 107/52  Pulse: 73  Temp: 97.8 F (36.6 C)    TempSrc: Oral  SpO2: 97%  Weight: (!) 306 lb 12.8 oz (139.2 kg)  Height: 5\' 7"  (1.702 m)   Physical Exam  Constitutional:  obese  Cardiovascular: Normal rate, regular rhythm and normal heart sounds.   Pulmonary/Chest: Effort normal and breath sounds normal.  Abdominal: Soft. There is no tenderness.  Musculoskeletal: She exhibits no edema.       Right knee: She exhibits swelling. She exhibits no ecchymosis, no deformity and no erythema. Tenderness found. Medial joint line tenderness noted.       Left knee: She exhibits normal range of motion and no swelling. No tenderness found.  Right knee mildly warmer than left  Nursing note and vitals reviewed.   Assessment & Plan:   See encounters tab for problem based medical decision making.   Patient seen with Dr. Dareen Piano

## 2016-03-10 ENCOUNTER — Telehealth: Payer: Self-pay | Admitting: *Deleted

## 2016-03-10 ENCOUNTER — Ambulatory Visit (INDEPENDENT_AMBULATORY_CARE_PROVIDER_SITE_OTHER): Payer: Medicare Other | Admitting: Internal Medicine

## 2016-03-10 ENCOUNTER — Encounter: Payer: Self-pay | Admitting: Internal Medicine

## 2016-03-10 ENCOUNTER — Ambulatory Visit: Payer: Medicare Other

## 2016-03-10 VITALS — BP 153/77 | HR 63 | Temp 97.9°F | Ht 67.0 in | Wt 307.5 lb

## 2016-03-10 DIAGNOSIS — Z7901 Long term (current) use of anticoagulants: Secondary | ICD-10-CM

## 2016-03-10 DIAGNOSIS — I2699 Other pulmonary embolism without acute cor pulmonale: Secondary | ICD-10-CM

## 2016-03-10 DIAGNOSIS — M7051 Other bursitis of knee, right knee: Secondary | ICD-10-CM

## 2016-03-10 DIAGNOSIS — M7050 Other bursitis of knee, unspecified knee: Secondary | ICD-10-CM

## 2016-03-10 LAB — POCT INR
INR: 1.9
INR: 1.9

## 2016-03-10 NOTE — Progress Notes (Signed)
Internal Medicine Clinic Attending  I saw and evaluated the patient.  I personally confirmed the key portions of the history and exam documented by Dr. Hoffman and I reviewed pertinent patient test results.  The assessment, diagnosis, and plan were formulated together and I agree with the documentation in the resident's note.      

## 2016-03-10 NOTE — Patient Instructions (Signed)
CONTINUE ALL MEDICATIONS AS PRESCRIBED. FOLLOW UP IF NO IMPROVEMENT IN PAIN.  Bursitis Bursitis is inflammation and irritation of a bursa, which is one of the small, fluid-filled sacs that cushion and protect the moving parts of your body. These sacs are located between bones and muscles, muscle attachments, or skin areas next to bones. A bursa protects these structures from the wear and tear that results from frequent movement. An inflamed bursa causes pain and swelling. Fluid may build up inside the sac. Bursitis is most common near joints, especially the knees, elbows, hips, and shoulders. CAUSES Bursitis can be caused by:   Injury from:  A direct blow, like falling on your knee or elbow.  Overuse of a joint (repetitive stress).  Infection. This can happen if bacteria gets into a bursa through a cut or scrape near a joint.  Diseases that cause joint inflammation, such as gout and rheumatoid arthritis. RISK FACTORS You may be at risk for bursitis if you:   Have a job or hobby that involves a lot of repetitive stress on your joints.  Have a condition that weakens your body's defense system (immune system), such as diabetes, cancer, or HIV.  Lift and reach overhead often.  Kneel or lean on hard surfaces often.  Run or walk often. SIGNS AND SYMPTOMS The most common signs and symptoms of bursitis are:  Pain that gets worse when you move the affected body part or put weight on it.  Inflammation.  Stiffness. Other signs and symptoms may include:  Redness.  Tenderness.  Warmth.  Pain that continues after rest.  Fever and chills. This may occur in bursitis caused by infection. DIAGNOSIS Bursitis may be diagnosed by:   Medical history and physical exam.  MRI.  A procedure to drain fluid from the bursa with a needle (aspiration). The fluid may be checked for signs of infection or gout.  Blood tests to rule out other causes of inflammation. TREATMENT  Bursitis can  usually be treated at home with rest, ice, compression, and elevation (RICE). For mild bursitis, RICE treatment may be all you need. Other treatments may include:  Nonsteroidal anti-inflammatory drugs (NSAIDs) to treat pain and inflammation.  Corticosteroids to fight inflammation. You may have these drugs injected into and around the area of bursitis.  Aspiration of bursitis fluid to relieve pain and improve movement.  Antibiotic medicine to treat an infected bursa.  A splint, brace, or walking aid.  Physical therapy if you continue to have pain or limited movement.  Surgery to remove a damaged or infected bursa. This may be needed if you have a very bad case of bursitis or if other treatments have not worked. HOME CARE INSTRUCTIONS   Take medicines only as directed by your health care provider.  If you were prescribed an antibiotic medicine, finish it all even if you start to feel better.  Rest the affected area as directed by your health care provider.  Keep the area elevated.  Avoid activities that make pain worse.  Apply ice to the injured area:  Place ice in a plastic bag.  Place a towel between your skin and the bag.  Leave the ice on for 20 minutes, 2-3 times a day.  Use splints, braces, pads, or walking aids as directed by your health care provider.  Keep all follow-up visits as directed by your health care provider. This is important. PREVENTION   Wear knee pads if you kneel often.  Wear sturdy running or walking shoes  that fit you well.  Take regular breaks from repetitive activity.  Warm up by stretching before doing any strenuous activity.  Maintain a healthy weight or lose weight as recommended by your health care provider. Ask your health care provider if you need help.  Exercise regularly. Start any new physical activity gradually. SEEK MEDICAL CARE IF:   Your bursitis is not responding to treatment or home care.  You have a fever.  You have  chills.   This information is not intended to replace advice given to you by your health care provider. Make sure you discuss any questions you have with your health care provider.   Document Released: 07/04/2000 Document Revised: 03/28/2015 Document Reviewed: 09/26/2013 Elsevier Interactive Patient Education [employer].

## 2016-03-10 NOTE — Assessment & Plan Note (Addendum)
Patient endorses a history of chronic right knee pain worsening in the last 2 months. The pain is located primarily on the medial distal border of her right knee. She states the pain is worse with ambulation and with standing. It is improved with rest, but not completely. She has tried voltaren gel and opioids without relief. The knee is specifically tender with superficial palpation of the medial distal border. She denies history of gout. Right knee xray in 2016 showed mild degenerative changes. Patient was seen last week for this pain and offered a steroid injection. Given her INR of >2, procedure was delayed. Patient presents today for recheck. INR is 1.9 and she is willing to proceed. Given the location and nature of her pain, presentation is more consistent with pes anserine bursitis or superficial thrombophlebitis of varicose vein located over knee rather than OA. Physical exam shows right knee is warm and tender to superficial touch. She is specifically tender to palpation over the pes anserine bursa of right knee. Please see ultrasound images attached.  Assessment: Right Knee Pes Anserine Bursitis  Plan:  -Lidocaine with Kenalog Injection  Bursa Injection Procedure Note Breanna Wilkins [id number] 26-Oct-1960  Procedure: Injection Indications: Pes Anserine Bursitis  Procedure Details Consent: Risks of procedure as well as the alternatives and risks of each were explained to the (patient/caregiver).  Consent for procedure obtained. Time Out: Verified patient identification, verified procedure, site/side was marked, verified correct patient position, special equipment/implants available, medications/allergies/relevent history reviewed, required imaging and test results available.  Performed   Local Anesthesia Used:Ethyl Chloride Spray  Injection: 1 cc of 1% lidocaine with 1 cc of Kenalog was injected into the right pes anserine bursa using a 25 gauge needle. A dressing was  applied.  Patient did tolerate procedure well. Estimated blood loss: 0 cc There were no immediate complications

## 2016-03-10 NOTE — Addendum Note (Signed)
Addended by: Hulan Fray on: 03/10/2016 05:34 PM   Modules accepted: Orders

## 2016-03-10 NOTE — Assessment & Plan Note (Signed)
Patient held her coumadin on Saturday and Sunday for INR reversal. INR today was 1.9 and sufficient for an injection. Dr. Elie Confer reviewed the patient's medications and restarted her coumadin as 5 mg daily and 2.5 mg on WF. Patient will return on 8/28 for repeat INR check.

## 2016-03-10 NOTE — Progress Notes (Signed)
INTERNAL MEDICINE TEACHING ATTENDING ADDENDUM - Aldine Contes M.D  Duration- indefinite, Indication- PE, DVT, INR- therapeutic. Agree with pharmacy recommendations as outlined in their note. A/c to be held for possible knee injection

## 2016-03-10 NOTE — Progress Notes (Signed)
Anti-Coagulation Progress Note  Breanna Wilkins is a 55 y.o. female who is currently on an anti-coagulation regimen.    RECENT RESULTS: Recent results are below, the most recent result is correlated with a dose of 17.5 mg reflective of two omitted doses prior to outpatient procedure.  Lab Results  Component Value Date   INR 1.9 03/10/2016   INR 1.9 03/10/2016   INR 2.80 03/03/2016    ANTI-COAG DOSE: Anticoagulation Dose Instructions as of 03/10/2016      [name] Tue Wed Thu Fri Sat   New Dose 5 mg 5 mg 5 mg 2.5 mg 5 mg 2.5 mg 5 mg       ANTICOAG SUMMARY: Anticoagulation Episode Summary    Current INR goal:   2.5-3.5  TTR:   52.7 % (4.9 y)  Next INR check:   03/17/2016  INR from last check:   1.9! (03/10/2016)  Weekly max dose:     Target end date:     [facility]  Preferred lab:     Send INR reminders to:        Comments:           ANTICOAG TODAY: Anticoagulation Summary  As of 03/10/2016   INR goal:   2.5-3.5  TTR:     Today's INR:   1.9!  Next INR check:   03/17/2016  Target end date:         Anticoagulation Episode Summary    [facility] check [facility]   [facility]   Preferred lab:      Send INR reminders to:      Comments:         PATIENT INSTRUCTIONS: Patient Instructions  Patient instructed to take medications as defined in the Anti-coagulation Track section of this encounter.  Patient instructed to take today's dose.  Patient verbalized understanding of these instructions.      FOLLOW-UP Return in about 7 days (around 03/17/2016) for Follow-up INR at [place], PharmD Pharmacy Resident  Pager [phone] 03/10/16 5:33 PM

## 2016-03-10 NOTE — Progress Notes (Signed)
    CC: KNEE PAIN  HPI: [name] is a 55 y.o. female with PMHx of VTE on chronic anticoagulation who presents to the clinic for right knee pain.  Patient endorses a history of chronic right knee pain worsening in the last 2 months. The pain is located primarily on the medial distal border of her right knee. She states the pain is worse with ambulation and with standing. It is improved with rest, but not completely. She has tried voltaren gel and opioids without relief. The knee is specifically tender with superficial palpation of the medial distal border. She denies history of gout. Right knee xray in 2016 showed mild degenerative changes. Patient was seen last week for this pain and offered a steroid injection. Given her INR of >2, procedure was delayed. Patient presents today for recheck. INR is 1.9 and she is willing to proceed. Given the location and nature of her pain, presentation is more consistent with pes anserine bursitis or superficial thrombophlebitis of varicose vein located over knee rather than OA.   Past Medical History:  Diagnosis Date  . Abdominal hernia   . CAD (coronary artery disease)    Perioperative non-STEMI March, 2014  //   cardiac catheterization at time of non-STEMI, March, 2014, minor nonobstructive coronary disease, vigorous LV function, possibly vasospastic event versus stress cardiomyopathy. No further workup of coronary disease  . CHF (congestive heart failure) ([place])   . Ejection fraction    65-70%, vigorous function, echo, March, 2011  . Ejection fraction    Vigorous function at time of catheterization October 06, 2012,  . Hypertension   . Lung nodule 12/2009   Very small, left upper lobe by CT June, 2011, one-year followup was stable  . OSA (obstructive sleep apnea)    CPAP  . Overweight(278.02)   . Pulmonary embolus ([place]) 01/16/10   on Coumadin indefinitely  . Shortness of breath   . Shoulder pain   . Warfarin anticoagulation     Review of  Systems: A complete ROS was negative except as noted in HPI.   Physical Exam: Vitals:   03/10/16 1402  BP: (!) 153/77  Pulse: 63  Temp: 97.9 F (36.6 C)  TempSrc: Oral  SpO2: 100%  Weight: (!) 307 lb 8 oz (139.5 kg)  Height: 5\' 7"  (1.702 m)   General: Vital signs reviewed.  Patient is well-developed and well-nourished, in no acute distress and cooperative with exam.  Cardiovascular: RRR, S1 normal, S2 normal, no murmurs, gallops, or rubs. Pulmonary/Chest: Clear to auscultation bilaterally, no wheezes, rales, or rhonchi. Abdominal: Soft, non-tender, non-distended, BS + Musculoskeletal: Right knee is warm and tender to superficial touch. She is specifically tender to palpation over pes anserine bursa of right knee. Please see ultrasound images below Extremities: No lower extremity edema bilaterally Skin: Warm, dry and intact. No rashes or erythema.  Assessment & Plan:  See encounters tab for problem based medical decision making.  Patient seen with Dr. Loree Fee axis view of supra-patellar pouch showing minimal effusion.      Cross section view over the pes anserine bursa. This was the area of maximum tenderness, consistent with pes anserine bursitis.

## 2016-03-10 NOTE — Patient Instructions (Signed)
Patient instructed to take medications as defined in the Anti-coagulation Track section of this encounter.  Patient instructed to take today's dose.  Patient verbalized understanding of these instructions.    

## 2016-03-11 NOTE — Progress Notes (Signed)
Internal Medicine Clinic Attending  I saw and evaluated the patient.  I personally confirmed the key portions of the history and exam documented by Dr. Quay Burow and I reviewed pertinent patient test results.  The assessment, diagnosis, and plan were formulated together and I agree with the documentation in the resident's note.  I was present for the entirety of the procedure. Ultrasound was utilized during this procedure and images were saved within this encounter.

## 2016-03-11 NOTE — Progress Notes (Signed)
INTERNAL MEDICINE TEACHING ATTENDING ADDENDUM - Lalla Brothers M.D  Duration- indefinite per patient preference, Indication- recurrent VTE, INR- subtherapeutic. Agree with pharmacy recommendations as outlined in their note.

## 2016-03-17 ENCOUNTER — Ambulatory Visit (INDEPENDENT_AMBULATORY_CARE_PROVIDER_SITE_OTHER): Payer: Medicare Other | Admitting: Pharmacist

## 2016-03-17 ENCOUNTER — Ambulatory Visit (INDEPENDENT_AMBULATORY_CARE_PROVIDER_SITE_OTHER): Payer: Medicare Other | Admitting: Internal Medicine

## 2016-03-17 VITALS — BP 127/72 | HR 71 | Temp 97.6°F | Ht 67.0 in | Wt 306.6 lb

## 2016-03-17 DIAGNOSIS — Z7901 Long term (current) use of anticoagulants: Secondary | ICD-10-CM

## 2016-03-17 DIAGNOSIS — I82509 Chronic embolism and thrombosis of unspecified deep veins of unspecified lower extremity: Secondary | ICD-10-CM | POA: Diagnosis not present

## 2016-03-17 DIAGNOSIS — M7150 Other bursitis, not elsewhere classified, unspecified site: Secondary | ICD-10-CM | POA: Diagnosis not present

## 2016-03-17 DIAGNOSIS — M7050 Other bursitis of knee, unspecified knee: Secondary | ICD-10-CM

## 2016-03-17 LAB — POCT INR: INR: 2

## 2016-03-17 MED ORDER — MELOXICAM 15 MG PO TABS: 15.0000 mg | ORAL_TABLET | Freq: Every day | ORAL | 0 refills | Status: DC

## 2016-03-17 MED ORDER — WARFARIN SODIUM 5 MG PO TABS: 5.0000 mg | ORAL_TABLET | Freq: Every day | ORAL | 2 refills | Status: DC

## 2016-03-17 NOTE — Patient Instructions (Signed)
Take meloxicam 15 mg once a day in the morning with food for 10 days. Continue all medications the same. You can apply heat to the area. If pain does not improve in 10 days or the pain worsens after you stop the medication, please call us for a follow up appointment.

## 2016-03-17 NOTE — Patient Instructions (Signed)
Patient instructed to take medications as defined in the Anti-coagulation Track section of this encounter.  Patient instructed to take today's dose.  Patient verbalized understanding of these instructions.    

## 2016-03-17 NOTE — Assessment & Plan Note (Signed)
Patient presented on 8/21/17with complaint right knee pain worsening in the last 2 months. The pain was located specifically on the medial distal border of her right knee. The pain was worse with ambulation and with standing. It improved with rest. She tried voltaren gel and opioids without relief. On physical exam, the right knee was specifically tender with superficial palpation of the medial distal border. Right knee xray in 2016 showed mild degenerative changes. [place] on 03/10/16 was concerning for pes anserine bursitis especially with her presentation and physical exam. She was treated with a lidocaine and kenalog injection of the right pes anserine bursa. Patient presents today stating that the injection did not help and her pain is persistent and worse. Pain is still located on medical distal border of right knee over pes anserine bursa. Area is warm and tender on palpation of the bursa. She denies fever, chills. Pain is worse with ambulation and standing.   Assessment: Pes Anserine Bursitis versus Superficial Thrombophlebitis   Plan: -Meloxicam 15 mg QD x 10 days -Continue other medications as prescribed -Notified to watch for evidence of bleeding and to inform us of any bleeding -Warm compresses -Elevate extremity -Follow up if no improvement

## 2016-03-17 NOTE — Progress Notes (Signed)
    CC: right knee pain  HPI: [name] is a 55 y.o. female with PMHx of Recurrent DVT/PE on chronic anticoagulation who presents to the clinic for persistent right knee pain.  Patient presented on 8/21/17with complaint right knee pain worsening in the last 2 months. The pain was located specifically on the medial distal border of her right knee. The pain was worse with ambulation and with standing. It improved with rest. She tried voltaren gel and opioids without relief. On physical exam, the right knee was specifically tender with superficial palpation of the medial distal border. Right knee xray in 2016 showed mild degenerative changes. [place] on 03/10/16 was concerning for pes anserine bursitis especially with her presentation and physical exam. She was treated with a lidocaine and kenalog injection of the right pes anserine bursa. Patient presents today stating that the injection did not help and her pain is persistent and worse. Pain is still located on medical distal border of right knee over pes anserine bursa. Area is warm and tender on palpation of the bursa. She denies fever, chills. Pain is worse with ambulation and standing.   Past Medical History:  Diagnosis Date  . Abdominal hernia   . CAD (coronary artery disease)    Perioperative non-STEMI March, 2014  //   cardiac catheterization at time of non-STEMI, March, 2014, minor nonobstructive coronary disease, vigorous LV function, possibly vasospastic event versus stress cardiomyopathy. No further workup of coronary disease  . CHF (congestive heart failure) ([place])   . Ejection fraction    65-70%, vigorous function, echo, March, 2011  . Ejection fraction    Vigorous function at time of catheterization October 06, 2012,  . Hypertension   . Lung nodule 12/2009   Very small, left upper lobe by CT June, 2011, one-year followup was stable  . OSA (obstructive sleep apnea)    CPAP  . Overweight(278.02)   . Pulmonary embolus ([place]) 01/16/10    on Coumadin indefinitely  . Shortness of breath   . Shoulder pain   . Warfarin anticoagulation     Review of Systems: Please see pertinent ROS reviewed in HPI and problem based charting.   Physical Exam: Vitals:   03/17/16 1518  BP: 127/72  Pulse: 71  Temp: 97.6 F (36.4 C)  TempSrc: Oral  SpO2: 100%  Weight: (!) 306 lb 9.6 oz (139.1 kg)  Height: 5\' 7"  (1.702 m)   General: Vital signs reviewed.  Patient is well-developed and well-nourished, in no acute distress and cooperative with exam.  Musculoskeletal: Pain at medial distal border of right knee over pes anserine bursa. Area is warm and tender on palpation of the bursa. No pain posterior to the knee on palpation. No calf pain. Extremities: Trace lower extremity edema bilaterally, pulses symmetric and intact bilaterally. No cyanosis or clubbing. Skin: Warm, dry and intact. No rashes or erythema. Psychiatric: Normal mood and affect. speech and behavior is normal. Cognition and memory are grossly normal.   Assessment & Plan:  See encounters tab for problem based medical decision making. Patient discussed with Dr. Evette Doffing

## 2016-03-17 NOTE — Progress Notes (Signed)
Anti-Coagulation Progress Note  Breanna Wilkins is a 55 y.o. female who is currently on an anti-coagulation regimen.    RECENT RESULTS: Recent results are below, the most recent result is correlated with a dose of 30 mg. per week: Lab Results  Component Value Date   INR 2.0 03/17/2016   INR 1.9 03/10/2016   INR 1.9 03/10/2016    ANTI-COAG DOSE: Anticoagulation Dose Instructions as of 03/17/2016      [name] Tue Wed Thu Fri Sat   New Dose 5 mg 5 mg 5 mg 5 mg 5 mg 5 mg 5 mg       ANTICOAG SUMMARY: Anticoagulation Episode Summary    Current INR goal:   2.5-3.5  TTR:   52.5 % (4.9 y)  Next INR check:   04/07/2016  INR from last check:   2.0! (03/17/2016)  Weekly max dose:     Target end date:     [facility]  Preferred lab:     Send INR reminders to:        Comments:           ANTICOAG TODAY: Anticoagulation Summary  As of 03/17/2016   INR goal:   2.5-3.5  TTR:     Today's INR:   2.0!  Next INR check:   04/07/2016  Target end date:         Anticoagulation Episode Summary    IN[facility] check location[facility]   [facility]   Preferred lab:      Send INR reminders to:      Comments:         PATIENT INSTRUCTIONS: There are no Patient Instructions on file for this visit.   FOLLOW-UP Return in 3 weeks (on 04/07/2016) for Follow up INR at [place], III Pharm.D., CACP

## 2016-03-19 NOTE — Progress Notes (Signed)
INTERNAL MEDICINE TEACHING ATTENDING ADDENDUM - Lalla Brothers M.D  Duration- indefinite per patient preference, Indication- recurrent VTE, INR- therapeutic. Agree with pharmacy recommendations as outlined in their note.

## 2016-03-19 NOTE — Progress Notes (Signed)
Internal Medicine Clinic Attending  Case discussed with Dr. Burns at the time of the visit.  We reviewed the resident's history and exam and pertinent patient test results.  I agree with the assessment, diagnosis, and plan of care documented in the resident's note.  

## 2016-03-21 ENCOUNTER — Telehealth: Payer: Self-pay | Admitting: *Deleted

## 2016-03-21 NOTE — Telephone Encounter (Signed)
C/o itching after taking Meloxicam - day #2 ; states the itching is not thru out the day. But doesn't want to stop the medication, b/c it really helps with the pain.But has a bruise on her lege from scratching.  Talked to Mannie Stabile - stated pt can stop med for a couple days then re-start to be sure the itching is from Meloxicam.  Informed pt to do try this and let us know. Stated only received a 10 day supply of medication. But will call back if problem continues.

## 2016-04-07 ENCOUNTER — Ambulatory Visit: Payer: Medicare Other

## 2016-04-10 ENCOUNTER — Other Ambulatory Visit: Payer: Self-pay | Admitting: Internal Medicine

## 2016-04-10 DIAGNOSIS — Z1231 Encounter for screening mammogram for malignant neoplasm of breast: Secondary | ICD-10-CM

## 2016-04-14 ENCOUNTER — Ambulatory Visit (INDEPENDENT_AMBULATORY_CARE_PROVIDER_SITE_OTHER): Payer: Medicare Other | Admitting: Pharmacist

## 2016-04-14 DIAGNOSIS — Z7901 Long term (current) use of anticoagulants: Secondary | ICD-10-CM | POA: Diagnosis not present

## 2016-04-14 LAB — POCT INR: INR: 3.6

## 2016-04-14 NOTE — Patient Instructions (Signed)
Patient instructed to take medications as defined in the Anti-coagulation Track section of this encounter.  Patient instructed to take today's dose.  Patient verbalized understanding of these instructions.    

## 2016-04-14 NOTE — Progress Notes (Signed)
Anti-Coagulation Progress Note  Breanna Wilkins is a 55 y.o. female who is currently on an anti-coagulation regimen.    RECENT RESULTS: Recent results are below, the most recent result is correlated with a dose of 35 mg. per week: Lab Results  Component Value Date   INR 3.6 04/14/2016   INR 2.0 03/17/2016   INR 1.9 03/10/2016    ANTI-COAG DOSE: Anticoagulation Dose Instructions as of 04/14/2016      [name] Tue Wed Thu Fri Sat   New Dose 5 mg 2.5 mg 5 mg 5 mg 5 mg 5 mg 5 mg       ANTICOAG SUMMARY: Anticoagulation Episode Summary    Current INR goal:   2.5-3.5  TTR:   52.7 % (5 y)  Next INR check:   05/05/2016  INR from last check:   3.6! (04/14/2016)  Weekly max dose:     Target end date:     [facility]  Preferred lab:     Send INR reminders to:        Comments:           ANTICOAG TODAY: Anticoagulation Summary  As of 04/14/2016   INR goal:   2.5-3.5  TTR:     Today's INR:   3.6!  Next INR check:   05/05/2016  Target end date:         Anticoagulation Episode Summary    IN[facility] check location[facility]   [facility]   Preferred lab:      Send INR reminders to:      Comments:         PATIENT INSTRUCTIONS: There are no Patient Instructions on file for this visit.   FOLLOW-UP Return in about 3 weeks (around 05/05/2016) for Follow up INR at 2:30PM.  Demetrius Charity, PharmD Acute Care Pharmacy Resident  Pager: [phone] 04/14/2016

## 2016-04-22 ENCOUNTER — Telehealth: Payer: Self-pay | Admitting: Internal Medicine

## 2016-04-22 ENCOUNTER — Other Ambulatory Visit: Payer: Self-pay | Admitting: *Deleted

## 2016-04-22 MED ORDER — HYDROCODONE-ACETAMINOPHEN 10-325 MG PO TABS: 1.0000 | ORAL_TABLET | Freq: Four times a day (QID) | ORAL | 0 refills | Status: DC | PRN

## 2016-04-22 NOTE — Telephone Encounter (Signed)
Reviewed [place] Controlled substance database, appears appropriate with last 3Rx prescribed on 5/18 filled on 5/23, 7/17, and 8/22.  Will discuss cutting down on number at next visit

## 2016-04-22 NOTE — Telephone Encounter (Signed)
Request sent to dr [name] Clayton

## 2016-04-22 NOTE — Telephone Encounter (Signed)
Pain med refill °

## 2016-04-28 ENCOUNTER — Other Ambulatory Visit: Payer: Self-pay | Admitting: Internal Medicine

## 2016-04-28 ENCOUNTER — Telehealth: Payer: Self-pay | Admitting: *Deleted

## 2016-04-28 MED ORDER — LISINOPRIL 10 MG PO TABS: 10.0000 mg | ORAL_TABLET | Freq: Every day | ORAL | 3 refills | Status: DC

## 2016-04-28 NOTE — Telephone Encounter (Signed)
Lisinopril has been reordered.

## 2016-04-28 NOTE — Telephone Encounter (Addendum)
Received refill request from pt's pharmacy for her lisinopril 10mg  once daily #90.  Rx "expired" on 04/25/2016 and fell off pt's medication list.  Will send request to pcp for review.  Please advise.Despina Hidden Cassady10/9/20172:41 PM

## 2016-05-01 ENCOUNTER — Encounter: Payer: Self-pay | Admitting: Internal Medicine

## 2016-05-01 ENCOUNTER — Ambulatory Visit
Admission: RE | Admit: 2016-05-01 | Discharge: 2016-05-01 | Disposition: A | Discharge status: Home / Self Care | Payer: Medicare Other | Source: Ambulatory Visit | Attending: Internal Medicine | Admitting: Internal Medicine

## 2016-05-01 ENCOUNTER — Ambulatory Visit (INDEPENDENT_AMBULATORY_CARE_PROVIDER_SITE_OTHER): Payer: Medicare Other | Admitting: Internal Medicine

## 2016-05-01 ENCOUNTER — Ambulatory Visit: Payer: Medicare Other

## 2016-05-01 VITALS — BP 135/83 | HR 64 | Temp 97.4°F | Ht 67.0 in | Wt 301.9 lb

## 2016-05-01 DIAGNOSIS — M75100 Unspecified rotator cuff tear or rupture of unspecified shoulder, not specified as traumatic: Secondary | ICD-10-CM | POA: Diagnosis not present

## 2016-05-01 DIAGNOSIS — I5032 Chronic diastolic (congestive) heart failure: Secondary | ICD-10-CM | POA: Diagnosis not present

## 2016-05-01 DIAGNOSIS — I10 Essential (primary) hypertension: Secondary | ICD-10-CM

## 2016-05-01 DIAGNOSIS — M1711 Unilateral primary osteoarthritis, right knee: Secondary | ICD-10-CM | POA: Diagnosis not present

## 2016-05-01 DIAGNOSIS — Z79891 Long term (current) use of opiate analgesic: Secondary | ICD-10-CM | POA: Diagnosis not present

## 2016-05-01 DIAGNOSIS — Z7901 Long term (current) use of anticoagulants: Secondary | ICD-10-CM

## 2016-05-01 DIAGNOSIS — Z87891 Personal history of nicotine dependence: Secondary | ICD-10-CM

## 2016-05-01 DIAGNOSIS — I110 Hypertensive heart disease with heart failure: Secondary | ICD-10-CM | POA: Diagnosis not present

## 2016-05-01 DIAGNOSIS — I5022 Chronic systolic (congestive) heart failure: Secondary | ICD-10-CM

## 2016-05-01 DIAGNOSIS — IMO0001 Reserved for inherently not codable concepts without codable children: Secondary | ICD-10-CM

## 2016-05-01 DIAGNOSIS — Z79899 Other long term (current) drug therapy: Secondary | ICD-10-CM

## 2016-05-01 DIAGNOSIS — Z1231 Encounter for screening mammogram for malignant neoplasm of breast: Secondary | ICD-10-CM

## 2016-05-01 DIAGNOSIS — S46811D Strain of other muscles, fascia and tendons at shoulder and upper arm level, right arm, subsequent encounter: Secondary | ICD-10-CM

## 2016-05-01 DIAGNOSIS — Z6841 Body Mass Index (BMI) 40.0 and over, adult: Secondary | ICD-10-CM

## 2016-05-01 MED ORDER — HYDROCODONE-ACETAMINOPHEN 10-325 MG PO TABS: 1.0000 | ORAL_TABLET | Freq: Four times a day (QID) | ORAL | 0 refills | Status: DC | PRN

## 2016-05-01 NOTE — Patient Instructions (Signed)
I want you to hold the Coumadin from Friday to next Tuesday. I will have you come back on Tuesday for a Steroid injection to your right knee.  We will reschedule your appointment with Dr Elie Confer.

## 2016-05-01 NOTE — Progress Notes (Signed)
[facility] Subjective:  HPI: [name] is a 55 y.o. female who presents for follow up of right knee pain.  Please see problem based charting below for the status of her chronic medical problems.     Review of Systems: Review of Systems  Constitutional: Negative for fever.  Respiratory: Negative for cough and shortness of breath.   Musculoskeletal: Positive for joint pain. Negative for back pain and falls.  Neurological: Negative for headaches.    Objective:  Physical Exam: Vitals:   05/01/16 1038 05/01/16 1134  BP: (!) 144/125 135/83  Pulse: 68 64  Temp: 97.4 F (36.3 C)   TempSrc: Oral   SpO2: 98%   Weight: (!) 301 lb 14.4 oz (136.9 kg)   Height: 5\' 7"  (1.702 m)   Physical Exam  Constitutional: She is well-developed, well-nourished, and in no distress.  Cardiovascular: Normal rate and regular rhythm.   Pulmonary/Chest: Effort normal and breath sounds normal. She has no wheezes. She has no rales.  Abdominal: Soft. Bowel sounds are normal.  Musculoskeletal: She exhibits edema (1+ bilateral lower extremity).       Right knee: She exhibits effusion. She exhibits no LCL laxity, normal meniscus and no MCL laxity. Tenderness found. Medial joint line tenderness noted. No lateral joint line and no patellar tendon tenderness noted.  Mild tenderness of left anterior lower leg, no redness or warmth  Nursing note and vitals reviewed.  Assessment & Plan:  Essential hypertension, benign HPI: Tolerating medications, no side effects  A: Essential HTN at goal  P: -Continue Coreg 6.25 BID, Lisinopril 10mg  daily   CHF NYHA class II (symptoms with moderately strenuous activities) HPI: No orthopnea, able to do daily activites without SOB. Is bothered by having to urinate with Lasix but does take it most days.  A: Chronic Diastolic CHF, stable  P: Continue lasix 80mg  dialy Continue Coreg 6.25 BID Continue Crestor 5mg  daily  Supraspinatus tendon  tear HPI: She report that her shoulder pain is much improved after the steroid injection.  Unfortunatley she has not been able to decrease the usage of her Norco much due to her knee pain.  A: Chronic supraspinatus tendon tear  P: Continue to manage with occional steroid injection and PRN Norco  Primary osteoarthritis of right knee HPI: She reports right knee pain currently 8/10 at worst 11/10. This significantly limits her ability to exercise even in the pool. She was seen about 2 months ago back for this pain and requested a steroid injection which she previously received a few months of relief.  However when she followed up for the knee injection an U/S was used that identified Pes Anserine bursitis so that was injected instead.  She was also give a short course of Mobic.  She reports that the steroid injection to the pes anserine bursa did nothing for her pain, mobic did not help.  This did frustrate her, since she had a good experience when I did the steroid injection she would like me to be the one to preform the injections in the future. The only thing that has helded her pain is the 10/325 Norco that she takes for knee and shoulder pain.  A: Right knee primary OA, exacerbation  P: Will plan to do steroid injection Instructed patient to hold warfarin starting Friday and will follow up on Tuesday with me for a steroid injection (5 days holding Warfarin) Will refill 3 months of Norco 10/325 #90, check UDS today, monitor usage and  decrease if able.  BMI 45.0-49.9, adult ([facility]) HPI:  Wt Readings from Last 5 Encounters:  05/01/16 (!) 301 lb 14.4 oz (136.9 kg)  03/17/16 (!) 306 lb 9.6 oz (139.1 kg)  03/10/16 (!) 307 lb 8 oz (139.5 kg)  03/03/16 (!) 306 lb 12.8 oz (139.2 kg)  02/04/16 (!) 305 lb 4.8 oz (138.5 kg)   Patient has lost 5 lbs, working on diet and water exercise  A: Morbid obesity  P: Congratulated, encouraged continued weight loss.   Medications Ordered Meds ordered  this encounter  Medications  . DISCONTD: HYDROcodone-acetaminophen (NORCO) 10-325 MG tablet    Sig: Take 1 tablet by mouth every 6 (six) hours as needed.    Dispense:  90 tablet    Refill:  0    Rx 1/3 may fill 30 days after previous script.  Marland Kitchen DISCONTD: HYDROcodone-acetaminophen (NORCO) 10-325 MG tablet    Sig: Take 1 tablet by mouth every 6 (six) hours as needed.    Dispense:  90 tablet    Refill:  0    Rx 2/3 may fill 30 days after previous script.  Marland Kitchen HYDROcodone-acetaminophen (NORCO) 10-325 MG tablet    Sig: Take 1 tablet by mouth every 6 (six) hours as needed.    Dispense:  90 tablet    Refill:  0    Rx 3/3 may fill 30 days after previous script.   Other Orders Orders Placed This Encounter  Procedures  . BMP8+Anion Gap  . ToxAssure Select,+Antidepr,UR   Follow Up: Return in about 3 months (around 08/01/2016), or follow up Tuesday 10/17 for knee Injection with Dr Lou Miner.

## 2016-05-02 LAB — BMP8+ANION GAP
ANION GAP: 16 mmol/L (ref 10.0–18.0)
BUN / CREAT RATIO: 21 (ref 9–23)
BUN: 15 mg/dL (ref 6–24)
CHLORIDE: 101 mmol/L (ref 96–106)
CO2: 24 mmol/L (ref 18–29)
Calcium: 9.2 mg/dL (ref 8.7–10.2)
Creatinine, Ser: 0.72 mg/dL (ref 0.57–1.00)
GFR calc Af Amer: 108 mL/min/{1.73_m2} (ref >59)
GFR calc non Af Amer: 94 mL/min/{1.73_m2} (ref >59)
GLUCOSE: 101 mg/dL — AB (ref 65–99)
Potassium: 4.1 mmol/L (ref 3.5–5.2)
SODIUM: 141 mmol/L (ref 134–144)

## 2016-05-02 NOTE — Assessment & Plan Note (Addendum)
HPI: She reports right knee pain currently 8/10 at worst 11/10. This significantly limits her ability to exercise even in the pool. She was seen about 2 months ago back for this pain and requested a steroid injection which she previously received a few months of relief.  However when she followed up for the knee injection an U/S was used that identified Pes Anserine bursitis so that was injected instead.  She was also give a short course of Mobic.  She reports that the steroid injection to the pes anserine bursa did nothing for her pain, mobic did not help.  This did frustrate her, since she had a good experience when I did the steroid injection she would like me to be the one to preform the injections in the future. The only thing that has helded her pain is the 10/325 Norco that she takes for knee and shoulder pain.  A: Right knee primary OA, exacerbation  P: Will plan to do steroid injection Instructed patient to hold warfarin starting Friday and will follow up on Tuesday with me for a steroid injection (5 days holding Warfarin) Will refill 3 months of Norco 10/325 #90, check UDS today, monitor usage and decrease if able.

## 2016-05-02 NOTE — Assessment & Plan Note (Signed)
HPI:  Wt Readings from Last 5 Encounters:  05/01/16 (!) 301 lb 14.4 oz (136.9 kg)  03/17/16 (!) 306 lb 9.6 oz (139.1 kg)  03/10/16 (!) 307 lb 8 oz (139.5 kg)  03/03/16 (!) 306 lb 12.8 oz (139.2 kg)  02/04/16 (!) 305 lb 4.8 oz (138.5 kg)   Patient has lost 5 lbs, working on diet and water exercise  A: Morbid obesity  P: Congratulated, encouraged continued weight loss.

## 2016-05-02 NOTE — Assessment & Plan Note (Signed)
HPI: She report that her shoulder pain is much improved after the steroid injection.  Unfortunatley she has not been able to decrease the usage of her Norco much due to her knee pain.  A: Chronic supraspinatus tendon tear  P: Continue to manage with occional steroid injection and PRN Norco

## 2016-05-02 NOTE — Assessment & Plan Note (Signed)
HPI: No orthopnea, able to do daily activites without SOB. Is bothered by having to urinate with Lasix but does take it most days.  A: Chronic Diastolic CHF, stable  P: Continue lasix 80mg  dialy Continue Coreg 6.25 BID Continue Crestor 5mg  daily

## 2016-05-02 NOTE — Assessment & Plan Note (Signed)
HPI: Tolerating medications, no side effects  A: Essential HTN at goal  P: -Continue Coreg 6.25 BID, Lisinopril 10mg  daily

## 2016-05-04 DIAGNOSIS — R778 Other specified abnormalities of plasma proteins: Secondary | ICD-10-CM | POA: Insufficient documentation

## 2016-05-04 DIAGNOSIS — R7989 Other specified abnormal findings of blood chemistry: Secondary | ICD-10-CM

## 2016-05-05 ENCOUNTER — Encounter: Payer: Self-pay | Admitting: Interventional Cardiology

## 2016-05-05 ENCOUNTER — Ambulatory Visit (INDEPENDENT_AMBULATORY_CARE_PROVIDER_SITE_OTHER): Payer: Medicare Other | Admitting: Interventional Cardiology

## 2016-05-05 ENCOUNTER — Ambulatory Visit: Payer: Medicare Other

## 2016-05-05 VITALS — BP 136/84 | HR 75 | Ht 67.0 in | Wt 299.8 lb

## 2016-05-05 DIAGNOSIS — I2782 Chronic pulmonary embolism: Secondary | ICD-10-CM | POA: Diagnosis not present

## 2016-05-05 DIAGNOSIS — I10 Essential (primary) hypertension: Secondary | ICD-10-CM

## 2016-05-05 DIAGNOSIS — R0989 Other specified symptoms and signs involving the circulatory and respiratory systems: Secondary | ICD-10-CM | POA: Diagnosis not present

## 2016-05-05 DIAGNOSIS — I272 Pulmonary hypertension, unspecified: Secondary | ICD-10-CM

## 2016-05-05 DIAGNOSIS — R748 Abnormal levels of other serum enzymes: Secondary | ICD-10-CM

## 2016-05-05 DIAGNOSIS — R7989 Other specified abnormal findings of blood chemistry: Secondary | ICD-10-CM

## 2016-05-05 DIAGNOSIS — R9430 Abnormal result of cardiovascular function study, unspecified: Secondary | ICD-10-CM

## 2016-05-05 DIAGNOSIS — E784 Other hyperlipidemia: Secondary | ICD-10-CM

## 2016-05-05 DIAGNOSIS — R778 Other specified abnormalities of plasma proteins: Secondary | ICD-10-CM

## 2016-05-05 DIAGNOSIS — E7849 Other hyperlipidemia: Secondary | ICD-10-CM

## 2016-05-05 NOTE — Patient Instructions (Signed)

## 2016-05-05 NOTE — Progress Notes (Signed)
Cardiology Office Note    Date:  05/05/2016   ID:  Breanna Wilkins, DOB 06/28/1961, MRN [id number]  PCP:  Breanna Groves, DO  Cardiologist: Breanna Grooms, MD   Chief Complaint  Patient presents with  . Congestive Heart Failure    History of Present Illness:  Breanna Wilkins is a 55 y.o. female who is self-referred having been seen by cardiology in the past including coronary angiography performed by Dr. Burt Wilkins in 2014. Previous history of PE.  She denies chest pain. She is limited by significant bilateral, right greater than left knee arthritis. She denies palpitations. She has not had syncope. There is orthopnea. She has known the have sleep apnea but does not wear C Pap. There are no recurring episodes of chest discomfort. Her significant prior medical history includes pulmonary emboli in 2011. She is on chronic anticoagulation therapy. She is followed in the internal medicine clinic by Dr. Johnnette Wilkins.   Past Medical History:  Diagnosis Date  . Abdominal hernia   . CAD (coronary artery disease)    Perioperative non-STEMI March, 2014  //   cardiac catheterization at time of non-STEMI, March, 2014, minor nonobstructive coronary disease, vigorous LV function, possibly vasospastic event versus stress cardiomyopathy. No further workup of coronary disease  . CHF (congestive heart failure) ([place])   . Ejection fraction    65-70%, vigorous function, echo, March, 2011  . Ejection fraction    Vigorous function at time of catheterization October 06, 2012,  . Hypertension   . Lung nodule 12/2009   Very small, left upper lobe by CT June, 2011, one-year followup was stable  . OSA (obstructive sleep apnea)    CPAP  . Overweight(278.02)   . Pulmonary embolus ([place]) 01/16/10   on Coumadin indefinitely  . Shortness of breath   . Shoulder pain   . Warfarin anticoagulation     Past Surgical History:  Procedure Laterality Date  . ABDOMINAL HYSTERECTOMY  07/2002   laparoscopic assisted  vaginal hysterectomy for menorrhagia, dysmenorrhea, anemia, fibroids  . INCISIONAL HERNIA REPAIR N/A 09/27/2012   Procedure: LAPAROSCOPIC INCISIONAL HERNIA possible open;  Surgeon: Breanna Hector, MD;  Location: [facility];  Service: General;  Laterality: N/A;  . INSERTION OF MESH N/A 09/27/2012   Procedure: INSERTION OF MESH;  Surgeon: Breanna Hector, MD;  Location: [facility];  Service: General;  Laterality: N/A;  . KNEE ARTHROSCOPY Left   . LEFT HEART CATHETERIZATION WITH CORONARY ANGIOGRAM N/A 10/06/2012   Procedure: LEFT HEART CATHETERIZATION WITH CORONARY ANGIOGRAM;  Surgeon: Breanna Mocha, MD;  Location: [facility] CATH LAB;  Service: Cardiovascular;  Laterality: N/A;    Current Medications: Outpatient Medications Prior to Visit  Medication Sig Dispense Refill  . aspirin 81 MG chewable tablet Chew 1 tablet (81 mg total) by mouth daily. 30 tablet 1  . BOOSTRIX 5-2.5-18.5 injection     . carvedilol (COREG) 6.25 MG tablet Take 1 tablet (6.25 mg total) by mouth 2 (two) times daily with a meal. 180 tablet 0  . CRESTOR 5 MG tablet TAKE ONE TABLET BY MOUTH AT BEDTIME 90 tablet 1  . diclofenac sodium (VOLTAREN) 1 % GEL Apply 2 g topically 4 (four) times daily. 1 Tube 1  . furosemide (LASIX) 80 MG tablet Take 1 tablet (80 mg total) by mouth daily. 90 tablet 3  . HYDROcodone-acetaminophen (NORCO) 10-325 MG tablet Take 1 tablet by mouth every 6 (six) hours as needed. 90 tablet 0  . lisinopril (PRINIVIL,ZESTRIL) 10  MG tablet Take 1 tablet (10 mg total) by mouth daily. 90 tablet 3  . pantoprazole (PROTONIX) 40 MG tablet TAKE 1 TABLET BY MOUTH 30 MINUTES BEFORE LUNCH OR DINNER 90 tablet 3  . triamcinolone (KENALOG) 0.025 % ointment Apply twice daily for 2 weeks then stop 15 g 0  . warfarin (COUMADIN) 5 MG tablet Take 1 tablet (5 mg total) by mouth daily at 6 PM. 31 tablet 2  . Cyanocobalamin (VITAMIN B 12 PO) Take by mouth. Reported on 08/07/2015    . cyclobenzaprine (FEXMID) 7.5 MG tablet Take 1 tablet (7.5 mg  total) by mouth 3 (three) times daily as needed for muscle spasms. (Patient not taking: Reported on 05/05/2016) 15 tablet 0  . diclofenac sodium (VOLTAREN) 1 % GEL Apply 2 g topically 4 (four) times daily as needed. (Patient not taking: Reported on 05/05/2016) 100 g 0  . tacrolimus (PROTOPIC) 0.03 % ointment Apply topically 2 (two) times daily. (Patient not taking: Reported on 05/05/2016) 100 g 0   No facility-administered medications prior to visit.      Allergies:   Review of patient's allergies indicates no known allergies.   Social History   Social History  . Marital status: Single    Spouse name: N/A  . Number of children: N/A  . Years of education: N/A   Social History Main Topics  . Smoking status: Former Research scientist (life sciences)  . Smokeless tobacco: Never Used     Comment: quit 12 years ago  . Alcohol use 3.6 oz/week    6 Cans of beer per week     Comment: Beer sometimes.  . Drug use: No     Comment: smoked joint 2 weeks ago  . Sexual activity: Not Asked   Other Topics Concern  . None   Social History Narrative   Financial assistance approved for 100% discount at [facility] and has [facility] card per [employer]   12/25/2009     Family History:  The patient's family history includes Bone cancer in her maternal grandmother; Deep vein thrombosis in her sister; Diabetes in her father, mother, and sister; Ovarian cancer in her mother.   ROS:   Please see the history of present illness.    Some hearing loss, right knee discomfort, decreased energy, snoring, and fatigue.  All other systems reviewed and are negative.   PHYSICAL EXAM:   VS:  BP 136/84   Pulse 75   Ht 5\' 7"  (1.702 m)   Wt 299 lb 12.8 oz (136 kg)   BMI 46.96 kg/m    GEN: Well nourished, well developed, in no acute distress . Obese African-American female in no distress. HEENT: normal  Neck: no JVD, carotid bruits, or masses Cardiac: RRR; no murmurs, rubs, or gallops,no edema  Respiratory:  clear to auscultation bilaterally,  normal work of breathing GI: soft, nontender, nondistended, + BS MS: no deformity or atrophy  Skin: warm and dry, no rash Neuro:  Alert and Oriented x 3, Strength and sensation are intact Psych: euthymic mood, full affect  Wt Readings from Last 3 Encounters:  05/05/16 299 lb 12.8 oz (136 kg)  05/01/16 (!) 301 lb 14.4 oz (136.9 kg)  03/17/16 (!) 306 lb 9.6 oz (139.1 kg)      Studies/Labs Reviewed:   EKG:  EKG  Normal sinus rhythm with left atrial abnormality. Nonspecific T wave flattening. No significant abnormality appreciated.  Recent Labs: 09/21/2015: TSH 0.741 05/01/2016: BUN 15; Creatinine, Ser 0.72; Potassium 4.1; Sodium 141   Lipid  Panel    Component Value Date/Time   CHOL 159 09/21/2015 1625   TRIG 127 09/21/2015 1625   HDL 48 09/21/2015 1625   CHOLHDL 3.3 09/21/2015 1625   CHOLHDL 3.9 10/17/2013 1016   VLDL 20 10/17/2013 1016   LDLCALC 86 09/21/2015 1625    Additional studies/ records that were reviewed today include:   Cardiac catheterization March 2014: Left ventriculography: Left ventricular systolic function is vigorous, LVEF is estimated at 65-70%, there is no significant mitral regurgitation  Final Conclusions:   1. Minor nonobstructive CAD 2. Vigorous LV function  Echocardiogram March 2014: ------------------------------------------------------------ Study Conclusions  - Left ventricle: Apical images are very limited. The base   of the LV moves. At the mid ventricle, there is severe   hypokinesis of the inferior segment and the inferolateral   segment. I can not assess any of the apical segments. EF   is reduced. The cavity size was normal. Wall thickness was   increased in a pattern of moderate LVH. - Aortic valve: Sclerosis without stenosis. - Right ventricle: There is hypokinesis of the apical aspect   of the RV free wall. The cavity size was normal. - Pericardium, extracardiac: A small pericardial effusion   was identified posterior to the  heart.   ASSESSMENT:    1. Other chronic pulmonary embolism without acute cor pulmonale ([place])   2. Essential hypertension, benign   3. Elevated troponin   4. Other hyperlipidemia   5. Chronic systolic heart failure   6. Pulmonary hypertension      PLAN:  In order of problems listed above:  1. No evidence of recurrent PE or right heart failure. 2. Blood pressure is under adequate control on this visit. We discussed 2 g sodium diet. We discussed target blood pressure 140/90 mmHg or less 3. She had elevated troponin during her hospital stay followed by cardiac catheterization during that visit that revealed minimal nonobstructive disease. This occurred in 2014. 4. Last lipid values were LDL 96 in March 2017. No changes needed. 5. Suggestion of prior reduction in LV systolic function on echocardiographic evaluation. Echo done in March 2014 suggested decreased function. Left ventriculography done at the time of cath in the same month revealed vigorous LV function with EF greater than 60%. 2-D echocardiogram will be performed to follow-up LV and RV function given her historically cardiac background. Clinically she seems to be doing relatively well. Beta blocker and ACE inhibitor therapy were started when she had "low EF". Need to be certain that LV function is remaining within the normal limits.    Medication Adjustments/Labs and Tests Ordered: Current medicines are reviewed at length with the patient today.  Concerns regarding medicines are outlined above.  Medication changes, Labs and Tests ordered today are listed in the Patient Instructions below. There are no Patient Instructions on file for this visit.   Signed, Breanna Grooms, MD  05/05/2016 3:10 PM    [facility] [place], [place], [place]  [address] Phone: [phone]; Fax: [phone]

## 2016-05-06 ENCOUNTER — Ambulatory Visit (INDEPENDENT_AMBULATORY_CARE_PROVIDER_SITE_OTHER): Payer: Medicare Other | Admitting: Internal Medicine

## 2016-05-06 ENCOUNTER — Encounter: Payer: Self-pay | Admitting: Internal Medicine

## 2016-05-06 VITALS — BP 118/71 | HR 80 | Temp 97.5°F | Ht 67.0 in | Wt 302.7 lb

## 2016-05-06 DIAGNOSIS — Z7901 Long term (current) use of anticoagulants: Secondary | ICD-10-CM

## 2016-05-06 DIAGNOSIS — M1711 Unilateral primary osteoarthritis, right knee: Secondary | ICD-10-CM | POA: Diagnosis not present

## 2016-05-06 LAB — POCT INR: INR: 1.4

## 2016-05-06 NOTE — Progress Notes (Signed)
PROCEDURE NOTE  Patient held coumadin for 5 days, INR 1.4 today PROCEDURE: right knee joint steroid injection.  PREOPERATIVE DIAGNOSIS: Osteoarthritis of the right knee.  POSTOPERATIVE DIAGNOSIS: Osteoarthritis of the right knee.  PROCEDURE: The patient was apprised of the risks and the benefits of the procedure and informed consent was obtained, as witnessed by Lela. Time-out procedure was performed, with confirmation of the patient's name, date of birth, and correct identification of the right knee to be injected. The patient's knee was then marked at the appropriate site for injection placement. The knee was sterilely prepped with Betadine. A 40 mg (1 milliliter) solution of Kenalog was drawn up into a 5 mL syringe with a 2 mL of 1% lidocaine. The patient was injected with a 27-gauge needle at the medial aspect of her right flexed knee. There were no complications. The patient tolerated the procedure well. There was minimal bleeding. The patient was instructed to ice her knee upon leaving clinic and refrain from overuse over the next 3 days. The patient was instructed to go to the emergency room with any usual pain, swelling, or redness occurred in the injected area. The patient was given a followup appointment to evaluate response to the injection to his increased range of motion and reduction of pain.  Will resume Coumadin today.  Follow up with Dr Elie Confer next Monday for INR check.

## 2016-05-06 NOTE — Patient Instructions (Signed)
Ice the knee and dont over exert yourself today.  Call if worsening pain, redness or fever.  You can resume your coumadin today.

## 2016-05-08 ENCOUNTER — Other Ambulatory Visit: Payer: Self-pay

## 2016-05-08 LAB — TOXASSURE SELECT,+ANTIDEPR,UR

## 2016-05-08 NOTE — Telephone Encounter (Signed)
Requesting pantoprazole to be filled @ [facility] on [address].

## 2016-05-09 MED ORDER — PANTOPRAZOLE SODIUM 40 MG PO TBEC: DELAYED_RELEASE_TABLET | ORAL | 3 refills | Status: DC

## 2016-05-12 ENCOUNTER — Ambulatory Visit (INDEPENDENT_AMBULATORY_CARE_PROVIDER_SITE_OTHER): Payer: Medicare Other | Admitting: Pharmacist

## 2016-05-12 DIAGNOSIS — Z86718 Personal history of other venous thrombosis and embolism: Secondary | ICD-10-CM | POA: Diagnosis not present

## 2016-05-12 DIAGNOSIS — Z7901 Long term (current) use of anticoagulants: Secondary | ICD-10-CM | POA: Diagnosis not present

## 2016-05-12 LAB — POCT INR: INR: 2.1

## 2016-05-12 NOTE — Progress Notes (Signed)
INTERNAL MEDICINE TEACHING ATTENDING ADDENDUM - Lucious Groves, DO Duration- lifelong, Indication- VTE, INR-  Lab Results  Component Value Date   INR 2.1 05/12/2016  . Agree with pharmacy recommendations as outlined in their note.

## 2016-05-12 NOTE — Patient Instructions (Signed)
Patient instructed to take medications as defined in the Anti-coagulation Track section of this encounter.  Patient instructed to take today's dose.  Patient verbalized understanding of these instructions.    

## 2016-05-12 NOTE — Progress Notes (Signed)
Anti-Coagulation Progress Note  Breanna Wilkins is a 55 y.o. female who is currently on an anti-coagulation regimen.    RECENT RESULTS: Recent results are below, the most recent result is correlated with a dose of 32.5 mg. per week: Lab Results  Component Value Date   INR 2.1 05/12/2016   INR 1.4 05/06/2016   INR 3.6 04/14/2016    ANTI-COAG DOSE: Anticoagulation Dose Instructions as of 05/12/2016      [name] Tue Wed Thu Fri Sat   New Dose 5 mg 2.5 mg 5 mg 5 mg 5 mg 5 mg 5 mg       ANTICOAG SUMMARY: Anticoagulation Episode Summary    Current INR goal:   2.5-3.5  TTR:   52.4 % (5.1 y)  Next INR check:   06/02/2016  INR from last check:   2.1! (05/12/2016)  Weekly max dose:     Target end date:     INR check [facility]   [facility]  Preferred lab:     Send INR reminders to:        Comments:           ANTICOAG TODAY: Anticoagulation Summary  As of 05/12/2016   INR goal:   2.5-3.5  TTR:     Today's INR:   2.1!  Next INR check:   06/02/2016  Target end date:         Anticoagulation Episode Summary    INR check location[facility]   [facility]   Preferred lab:      Send INR reminders to:      Comments:         PATIENT INSTRUCTIONS: There are no Patient Instructions on file for this visit.   FOLLOW-UP Return in about 3 weeks (around 06/02/2016) for Follow up INR at [place], PharmD Acute Care Pharmacy Resident  Pager: [phone] 05/12/2016

## 2016-05-21 ENCOUNTER — Ambulatory Visit (HOSPITAL_COMMUNITY): Payer: Medicare Other | Attending: Cardiovascular Disease

## 2016-05-21 ENCOUNTER — Other Ambulatory Visit: Payer: Self-pay

## 2016-05-21 DIAGNOSIS — I313 Pericardial effusion (noninflammatory): Secondary | ICD-10-CM | POA: Insufficient documentation

## 2016-05-21 DIAGNOSIS — R0989 Other specified symptoms and signs involving the circulatory and respiratory systems: Secondary | ICD-10-CM | POA: Diagnosis not present

## 2016-05-21 DIAGNOSIS — I272 Pulmonary hypertension, unspecified: Secondary | ICD-10-CM | POA: Insufficient documentation

## 2016-05-21 DIAGNOSIS — R9430 Abnormal result of cardiovascular function study, unspecified: Secondary | ICD-10-CM

## 2016-05-21 DIAGNOSIS — I501 Left ventricular failure: Secondary | ICD-10-CM | POA: Insufficient documentation

## 2016-05-22 ENCOUNTER — Other Ambulatory Visit: Payer: Self-pay | Admitting: *Deleted

## 2016-05-22 ENCOUNTER — Other Ambulatory Visit: Payer: Self-pay | Admitting: Internal Medicine

## 2016-05-22 MED ORDER — CARVEDILOL 6.25 MG PO TABS: 6.2500 mg | ORAL_TABLET | Freq: Two times a day (BID) | ORAL | 1 refills | Status: DC

## 2016-05-22 NOTE — Telephone Encounter (Signed)
carvedilol (COREG) 6.25 MG tablet [facility] [address]

## 2016-06-02 ENCOUNTER — Ambulatory Visit (INDEPENDENT_AMBULATORY_CARE_PROVIDER_SITE_OTHER): Payer: Medicare Other | Admitting: Pharmacist

## 2016-06-02 DIAGNOSIS — Z7901 Long term (current) use of anticoagulants: Secondary | ICD-10-CM | POA: Diagnosis not present

## 2016-06-02 DIAGNOSIS — I2699 Other pulmonary embolism without acute cor pulmonale: Secondary | ICD-10-CM | POA: Diagnosis not present

## 2016-06-02 LAB — POCT INR: INR: 2.8

## 2016-06-02 NOTE — Progress Notes (Signed)
Anticoagulation Management Breanna Wilkins is a 55 y.o. female who reports to the clinic for monitoring of warfarin treatment.    Indication: DVT and PECHA2DS2VASC score 4, HAS-BLED 3 Duration: indefinite  Anticoagulation Clinic Visit History: Patient does not report signs/symptoms of bleeding or thromboembolism   Other recent changes: None   Anticoagulation Episode Summary    Current INR goal:   2.5-3.5  TTR:   52.3 % (5.1 y)  Next INR check:   06/30/2016  INR from last check:   2.8 (06/02/2016)  Weekly max dose:     Target end date:     INR check location:   [facility]  Preferred lab:     Send INR reminders to:        Comments:          ASSESSMENT Recent Results: 2.8 The most recent result is correlated with 32.5 mg per week: Lab Results  Component Value Date   INR 2.8 06/02/2016   INR 2.1 05/12/2016   INR 1.4 05/06/2016    Anticoagulation Dosing: INR as of 06/02/2016 and Previous Dosing Information    INR Dt INR Goal [name]   06/02/2016 2.8 2.5-3.5 32.5 mg 5 mg 2.5 mg 5 mg 5 mg 5 mg 5 mg 5 mg    Anticoagulation Dose Instructions as of 06/02/2016      Total [name]   New Dose 32.5 mg 5 mg 2.5 mg 5 mg 5 mg 5 mg 5 mg 5 mg     (5 mg x 1)  (5 mg x 0.5)  (5 mg x 1)  (5 mg x 1)  (5 mg x 1)  (5 mg x 1)  (5 mg x 1)                           INR today: Therapeutic  PLAN Weekly dose was unchanged   Patient Instructions  Patient educated about medication as defined in this encounter and verbalized understanding by repeating back instructions provided.   Patient advised to contact clinic or seek medical attention if signs/symptoms of bleeding or thromboembolism occur.  Patient verbalized understanding by repeating back information and was advised to contact me if further medication-related questions arise. Patient was also provided an information handout.  Follow-up Return in about 4 weeks (around  06/30/2016) for Follow up INR 06/30/2016 around 2pm.  Kim,Jennifer J  15 minutes spent face-to-face with the patient during the encounter. 50% of time spent on education. 50% of time was spent on assessment and plan.

## 2016-06-02 NOTE — Patient Instructions (Signed)
Patient educated about medication as defined in this encounter and verbalized understanding by repeating back instructions provided.   

## 2016-06-03 NOTE — Progress Notes (Signed)
Indication: Deep venous thrombosis. Duration: Indefinitely per patient choice. INR: At target. Agree with Dr. Julianne Rice assessment and plan.

## 2016-06-06 ENCOUNTER — Other Ambulatory Visit: Payer: Self-pay | Admitting: *Deleted

## 2016-06-06 MED ORDER — ROSUVASTATIN CALCIUM 5 MG PO TABS: 5.0000 mg | ORAL_TABLET | Freq: Every day | ORAL | 3 refills | Status: DC

## 2016-06-18 ENCOUNTER — Other Ambulatory Visit: Payer: Self-pay

## 2016-06-18 DIAGNOSIS — I509 Heart failure, unspecified: Secondary | ICD-10-CM

## 2016-06-18 NOTE — Telephone Encounter (Signed)
furosemide (LASIX) 80 MG tablet, refill request @ [facility] in .

## 2016-06-19 MED ORDER — FUROSEMIDE 80 MG PO TABS: 80.0000 mg | ORAL_TABLET | Freq: Every day | ORAL | 3 refills | Status: DC

## 2016-07-03 ENCOUNTER — Ambulatory Visit (INDEPENDENT_AMBULATORY_CARE_PROVIDER_SITE_OTHER): Payer: Medicare Other | Admitting: Pharmacist

## 2016-07-03 DIAGNOSIS — I82439 Acute embolism and thrombosis of unspecified popliteal vein: Secondary | ICD-10-CM

## 2016-07-03 DIAGNOSIS — Z7901 Long term (current) use of anticoagulants: Secondary | ICD-10-CM | POA: Diagnosis not present

## 2016-07-03 DIAGNOSIS — Z86711 Personal history of pulmonary embolism: Secondary | ICD-10-CM | POA: Diagnosis not present

## 2016-07-03 LAB — POCT INR: INR: 3.5

## 2016-07-03 NOTE — Patient Instructions (Signed)
Patient educated about medication as defined in this encounter and verbalized understanding by repeating back instructions provided.   

## 2016-07-03 NOTE — Progress Notes (Signed)
Anticoagulation Management CHIRSTY Wilkins is a 55 y.o. female who reports to the clinic for monitoring of warfarin treatment.    Indication: DVT and PE history Duration: indefinite  Anticoagulation Clinic Visit History: Patient does not report signs/symptoms of bleeding or thromboembolism  Anticoagulation Episode Summary    Current INR goal:   2.5-3.5  TTR:   53.1 % (5.2 y)  Next INR check:   08/04/2016  INR from last check:   3.5 (07/03/2016)  Weekly max dose:     Target end date:     INR check location[facility]   [facility]  Preferred lab:     Send INR reminders to:        Comments:          ASSESSMENT Recent Results: The most recent result is correlated with 32.5 mg per week: Lab Results  Component Value Date   INR 3.5 07/03/2016   INR 2.8 06/02/2016   INR 2.1 05/12/2016   Anticoagulation Dosing: INR as of 07/03/2016 and Previous Dosing Information    INR Dt INR Goal [name]   07/03/2016 3.5 2.5-3.5 32.5 mg 5 mg 2.5 mg 5 mg 5 mg 5 mg 5 mg 5 mg    Anticoagulation Dose Instructions as of 07/03/2016      Total [name]   New Dose 32.5 mg 5 mg 2.5 mg 5 mg 5 mg 5 mg 5 mg 5 mg     (5 mg x 1)  (5 mg x 0.5)  (5 mg x 1)  (5 mg x 1)  (5 mg x 1)  (5 mg x 1)  (5 mg x 1)                           INR today: Therapeutic  PLAN Weekly dose was unchanged  Patient Instructions  Patient educated about medication as defined in this encounter and verbalized understanding by repeating back instructions provided.   Patient advised to contact clinic or seek medical attention if signs/symptoms of bleeding or thromboembolism occur.  Patient verbalized understanding by repeating back information and was advised to contact me if further medication-related questions arise. Patient was also provided an information handout.  Follow-up Return in about 1 month (around 08/03/2016) for Follow up INR 08/04/16 around 1pm.  Wilkins,Breanna  J  15 minutes spent face-to-face with the patient during the encounter. 50% of time spent on education. 50% of time was spent on assessment and plan.

## 2016-07-07 NOTE — Progress Notes (Signed)
I have reviewed Dr. Noel Journey note.  Patient is on [facility] for PE.  INR 3.5 which was at goal.  Continue to monitor and follow up as planned.

## 2016-07-16 ENCOUNTER — Other Ambulatory Visit: Payer: Self-pay | Admitting: Student in an Organized Health Care Education/Training Program

## 2016-07-16 ENCOUNTER — Other Ambulatory Visit: Payer: Self-pay

## 2016-07-16 NOTE — Telephone Encounter (Signed)
warfarin (COUMADIN) 5 MG tablet, refill request @ [facility] in [place].

## 2016-07-17 MED ORDER — WARFARIN SODIUM 5 MG PO TABS: ORAL_TABLET | ORAL | 2 refills | Status: DC

## 2016-07-17 NOTE — Telephone Encounter (Signed)
Needs Jan / Feb appt PCP - chronic pain appt

## 2016-08-11 ENCOUNTER — Ambulatory Visit: Payer: Medicare Other

## 2016-08-13 ENCOUNTER — Ambulatory Visit (INDEPENDENT_AMBULATORY_CARE_PROVIDER_SITE_OTHER): Payer: Medicare Other | Admitting: Pharmacist

## 2016-08-13 ENCOUNTER — Other Ambulatory Visit: Payer: Self-pay | Admitting: Pharmacist

## 2016-08-13 DIAGNOSIS — I82439 Acute embolism and thrombosis of unspecified popliteal vein: Secondary | ICD-10-CM | POA: Diagnosis not present

## 2016-08-13 DIAGNOSIS — Z7901 Long term (current) use of anticoagulants: Secondary | ICD-10-CM

## 2016-08-13 DIAGNOSIS — I509 Heart failure, unspecified: Secondary | ICD-10-CM

## 2016-08-13 LAB — POCT INR: INR: 2.6

## 2016-08-13 MED ORDER — FUROSEMIDE 80 MG PO TABS: 80.0000 mg | ORAL_TABLET | Freq: Every day | ORAL | 2 refills | Status: DC

## 2016-08-13 MED ORDER — WARFARIN SODIUM 5 MG PO TABS: ORAL_TABLET | ORAL | 2 refills | Status: DC

## 2016-08-13 NOTE — Progress Notes (Signed)
Patient requested to transfer lasix to a different [place]. Prescription sent

## 2016-08-13 NOTE — Progress Notes (Signed)
Anticoagulation Management Breanna Wilkins is a 56 y.o. female who reports to the clinic for monitoring of warfarin treatment.    Indication: DVT and PE history Duration: indefinite Supervising physician: [place] [facility] Visit History: Patient does not report signs/symptoms of bleeding or thromboembolism  Anticoagulation Episode Summary    Current INR goal:   2.5-3.5  TTR:   54.1 % (5.3 y)  Next INR check:   09/08/2016  INR from last check:   2.6 (08/13/2016)  Weekly max dose:     Target end date:     INR check location[facility]   [facility]  Preferred lab:     Send INR reminders to:        Comments:          ASSESSMENT Recent Results: The most recent result is correlated with 32.5 mg per week: Lab Results  Component Value Date   INR 2.6 08/13/2016   INR 3.5 07/03/2016   INR 2.8 06/02/2016   Anticoagulation Dosing: INR as of 08/13/2016 and Previous Dosing Information    INR Dt INR Goal [name]   08/13/2016 2.6 2.5-3.5 32.5 mg 5 mg 2.5 mg 5 mg 5 mg 5 mg 5 mg 5 mg    Anticoagulation Dose Instructions as of 08/13/2016      Total [name]   New Dose 32.5 mg 5 mg 2.5 mg 5 mg 5 mg 5 mg 5 mg 5 mg     (5 mg x 1)  (5 mg x 0.5)  (5 mg x 1)  (5 mg x 1)  (5 mg x 1)  (5 mg x 1)  (5 mg x 1)                           INR today: Therapeutic  PLAN Weekly dose was unchanged   Patient Instructions  Patient educated about medication as defined in this encounter and verbalized understanding by repeating back instructions provided.   Patient advised to contact clinic or seek medical attention if signs/symptoms of bleeding or thromboembolism occur.  Patient verbalized understanding by repeating back information and was advised to contact me if further medication-related questions arise. Patient was also provided an information handout.  Follow-up Return in about 4 weeks (around 09/08/2016) for For INR  monitoring around 09/08/2016 at 1pm.  [name]  15 minutes spent face-to-face with the patient during the encounter. 50% of time spent on education. 50% of time was spent on assessment and plan.

## 2016-08-13 NOTE — Patient Instructions (Signed)
Patient educated about medication as defined in this encounter and verbalized understanding by repeating back instructions provided.   

## 2016-09-11 ENCOUNTER — Ambulatory Visit (INDEPENDENT_AMBULATORY_CARE_PROVIDER_SITE_OTHER): Payer: Medicare Other | Admitting: Pharmacist

## 2016-09-11 ENCOUNTER — Ambulatory Visit (INDEPENDENT_AMBULATORY_CARE_PROVIDER_SITE_OTHER): Payer: Medicare Other | Admitting: Internal Medicine

## 2016-09-11 ENCOUNTER — Encounter: Payer: Self-pay | Admitting: Internal Medicine

## 2016-09-11 VITALS — BP 137/58 | HR 68 | Temp 97.6°F | Ht 67.0 in | Wt 297.8 lb

## 2016-09-11 DIAGNOSIS — Z1159 Encounter for screening for other viral diseases: Secondary | ICD-10-CM | POA: Diagnosis not present

## 2016-09-11 DIAGNOSIS — I82419 Acute embolism and thrombosis of unspecified femoral vein: Secondary | ICD-10-CM

## 2016-09-11 DIAGNOSIS — I82509 Chronic embolism and thrombosis of unspecified deep veins of unspecified lower extremity: Secondary | ICD-10-CM | POA: Diagnosis not present

## 2016-09-11 DIAGNOSIS — G5601 Carpal tunnel syndrome, right upper limb: Secondary | ICD-10-CM

## 2016-09-11 DIAGNOSIS — Z79899 Other long term (current) drug therapy: Secondary | ICD-10-CM

## 2016-09-11 DIAGNOSIS — Z6841 Body Mass Index (BMI) 40.0 and over, adult: Secondary | ICD-10-CM

## 2016-09-11 DIAGNOSIS — Z7901 Long term (current) use of anticoagulants: Secondary | ICD-10-CM

## 2016-09-11 DIAGNOSIS — M791 Myalgia, unspecified site: Secondary | ICD-10-CM

## 2016-09-11 DIAGNOSIS — Z79891 Long term (current) use of opiate analgesic: Secondary | ICD-10-CM

## 2016-09-11 DIAGNOSIS — I82533 Chronic embolism and thrombosis of popliteal vein, bilateral: Secondary | ICD-10-CM

## 2016-09-11 DIAGNOSIS — I10 Essential (primary) hypertension: Secondary | ICD-10-CM

## 2016-09-11 DIAGNOSIS — I82439 Acute embolism and thrombosis of unspecified popliteal vein: Secondary | ICD-10-CM

## 2016-09-11 DIAGNOSIS — Z87891 Personal history of nicotine dependence: Secondary | ICD-10-CM | POA: Diagnosis not present

## 2016-09-11 DIAGNOSIS — E559 Vitamin D deficiency, unspecified: Secondary | ICD-10-CM

## 2016-09-11 DIAGNOSIS — M1711 Unilateral primary osteoarthritis, right knee: Secondary | ICD-10-CM

## 2016-09-11 DIAGNOSIS — I2782 Chronic pulmonary embolism: Secondary | ICD-10-CM

## 2016-09-11 DIAGNOSIS — I5022 Chronic systolic (congestive) heart failure: Secondary | ICD-10-CM

## 2016-09-11 LAB — POCT INR: INR: 2

## 2016-09-11 MED ORDER — HYDROCODONE-ACETAMINOPHEN 10-325 MG PO TABS: 1.0000 | ORAL_TABLET | Freq: Four times a day (QID) | ORAL | 0 refills | Status: DC | PRN

## 2016-09-11 MED ORDER — DICLOFENAC SODIUM 1 % TD GEL: 2.0000 g | Freq: Four times a day (QID) | TRANSDERMAL | 5 refills | Status: DC

## 2016-09-11 NOTE — Progress Notes (Signed)
Anticoagulation Management Breanna Wilkins is a 56 y.o. female who reports to the clinic for monitoring of warfarin treatment.    Indication: DVT and PE history Duration: indefinite Supervising physician: Joni Reining  Anticoagulation Clinic Visit History: Patient does not report signs/symptoms of bleeding or thromboembolism  Anticoagulation Episode Summary    Current INR goal:   2.5-3.5  TTR:   53.5 % (5.4 y)  Next INR check:   10/02/2016  INR from last check:   2.0! (09/11/2016)  Weekly max dose:     Target end date:     INR check location:   [facility]  Preferred lab:     Send INR reminders to:        Comments:          ASSESSMENT Recent Results: Lab Results  Component Value Date   INR 2.0 09/11/2016   INR 2.6 08/13/2016   INR 3.5 07/03/2016   Anticoagulation Dosing: INR as of 09/11/2016 and Previous Dosing Information    INR Dt INR Goal [name]   09/11/2016 2.0 2.5-3.5 32.5 mg 5 mg 2.5 mg 5 mg 5 mg 5 mg 5 mg 5 mg    Anticoagulation Dose Instructions as of 09/11/2016      Total [name]   New Dose 37.5 mg 5 mg 5 mg 5 mg 5 mg 7.5 mg 5 mg 5 mg     (5 mg x 1)  (5 mg x 1)  (5 mg x 1)  (5 mg x 1)  (5 mg x 1.5)  (5 mg x 1)  (5 mg x 1)                           INR today: Subtherapeutic  PLAN Weekly dose was increased by 13% to 37.5 mg per week  Patient advised to contact clinic or seek medical attention if signs/symptoms of bleeding or thromboembolism occur.  Patient verbalized understanding by repeating back information and was advised to contact me if further medication-related questions arise. Patient was also provided an information handout.  Follow-up 3 weeks  [name]

## 2016-09-11 NOTE — Patient Instructions (Signed)
I will call with you lab results.

## 2016-09-11 NOTE — Patient Instructions (Signed)
Patient educated about medication as defined in this encounter and verbalized understanding by repeating back instructions provided.   

## 2016-09-11 NOTE — Progress Notes (Signed)
[facility] Subjective:  HPI: [name] is a 56 y.o. female who presents for Follow-up of hypertension, chronic pain.  Please see problem based charting below for the status of her chronic medical problems.     Review of Systems: Review of Systems  Constitutional: Positive for fever and weight loss.  Cardiovascular: Negative for chest pain and leg swelling.  Musculoskeletal: Positive for joint pain. Negative for falls.  Neurological: Negative for dizziness.    Objective:  Physical Exam: Vitals:   09/11/16 1025  BP: (!) 137/58  Pulse: 68  Temp: 97.6 F (36.4 C)  TempSrc: Oral  SpO2: 99%  Weight: 297 lb 12.8 oz (135.1 kg)  Height: 5\' 7"  (1.702 m)   Physical Exam  Constitutional: She is well-developed, well-nourished, and in no distress. No distress.  HENT:  Head: Normocephalic and atraumatic.  Eyes: Conjunctivae are normal.  Cardiovascular: Normal rate, regular rhythm, normal heart sounds and intact distal pulses.   No murmur heard. Pulmonary/Chest: Effort normal and breath sounds normal. No respiratory distress. She has no wheezes. She has no rales.  Abdominal: Soft. Bowel sounds are normal. She exhibits no distension. There is no tenderness.  Musculoskeletal: She exhibits no edema.       Right knee: She exhibits no effusion. Tenderness found. Medial joint line tenderness noted. No lateral joint line tenderness noted.  Positive tinnel's sign right wrist  Skin: Skin is warm and dry. She is not diaphoretic.  Psychiatric: Affect and judgment normal.  Nursing note and vitals reviewed.  Assessment & Plan:  Long-term (current) use of anticoagulants HPI: No bleeding, doing well with coumadin.  Last INR 2.  A: Chronic PE, recurrent DVT  P: Continue coumadin.  BMI 45.0-49.9, adult (HCC) Wt Readings from Last 5 Encounters:  09/11/16 297 lb 12.8 oz (135.1 kg)  05/06/16 (!) 302 lb 11.2 oz (137.3 kg)  05/05/16 299 lb 12.8 oz (136 kg)   05/01/16 (!) 301 lb 14.4 oz (136.9 kg)  03/17/16 (!) 306 lb 9.6 oz (139.1 kg)     HPI has lost 5 pounds since last visit.  Assessment morbid obesity  Plan Continue to encourage weight loss.   Primary osteoarthritis of right knee History of present illness: She continues to have some knee pain of her right knee. She currently rates as 7 out of 10 however overall she feels she is doing well she's actually had excellent benefit from steroid injections intermittently. She is also a using hydrocodone acetaminophen as needed and takes about 3 pills a day. She reports that this has been very helpful in helping her to be able to tolerate some of the exercise and weight loss which is our ultimate goal to help her chronic knee pain.  Assessment primary osteoarthritis of right knee  Plan We'll continue the hydrocodone acetaminophen to use both for her knee pain and shoulder pain. Her previous UDS was positive for [facility] but she has had no other risk factors for abuse and appears to be using appropriately. She does not want a steroid injection today however I have encouraged the intermediate use and hopefully we can taper her medication some reason the steroid injections. I've also informed her that she may need orthopedic surgery although she is very fearful after complications of a previous orthopedic procedure.  Carpal tunnel syndrome History of present illness: She reports some mild intermittent tingling of her right second and third finger but typically happens more at night. She is right-handed. On exam she is  positive for Tinel sign on the right. I reminded her of her previous diagnosis of carpal tunnel of the right. She had forgotten and stopped using the wrist splint she notes that the wrist splint did help.  Assessment carpal tunnel syndrome of the right  Plan resume use of nighttime wrist splinting  Long term (current) use of opiate analgesic I reviewed the [place] controlled  substances reporting system today. She has had refills 10/13, 11/15, 1/17. Overall this appears grossly appropriate she appears to have used pain medication a little bit less frequently in December. There are no other abnormalities were other providers listed.  Myalgia History of present illness: She reports some myalgias typically more in her legs. Does not notice any associated or aggravating symptoms.  Assessment myalgias  Plan She is on a statin with appropriate indication. She is also very dark skinned and covered With closing which makes a concern for vitamin D deficiency. Therefore checked a vitamin D level it was found to be low consistent with vitamin D deficiency. I called the patient and will have her start 1000 units of vitamin D daily.   Medications Ordered Meds ordered this encounter  Medications  . DISCONTD: HYDROcodone-acetaminophen (NORCO) 10-325 MG tablet    Sig: Take 1 tablet by mouth every 6 (six) hours as needed.    Dispense:  90 tablet    Refill:  0    Rx 1/3 may fill 30 days after previous script.  Marland Kitchen DISCONTD: HYDROcodone-acetaminophen (NORCO) 10-325 MG tablet    Sig: Take 1 tablet by mouth every 6 (six) hours as needed.    Dispense:  90 tablet    Refill:  0    Rx 2/3 may fill 30 days after previous script.  Marland Kitchen HYDROcodone-acetaminophen (NORCO) 10-325 MG tablet    Sig: Take 1 tablet by mouth every 6 (six) hours as needed.    Dispense:  90 tablet    Refill:  0    Rx 3/3 may fill 30 days after previous script.  . diclofenac sodium (VOLTAREN) 1 % GEL    Sig: Apply 2 g topically 4 (four) times daily.    Dispense:  1 Tube    Refill:  5   Other Orders Orders Placed This Encounter  Procedures  . Vitamin D (25 hydroxy)  . Hepatitis C antibody   Follow Up: Return in about 3 months (around 12/09/2016).

## 2016-09-12 LAB — VITAMIN D 25 HYDROXY (VIT D DEFICIENCY, FRACTURES): VIT D 25 HYDROXY: 14 ng/mL — AB (ref 30.0–100.0)

## 2016-09-12 LAB — HEPATITIS C ANTIBODY

## 2016-09-17 DIAGNOSIS — Z79891 Long term (current) use of opiate analgesic: Secondary | ICD-10-CM | POA: Insufficient documentation

## 2016-09-17 DIAGNOSIS — E559 Vitamin D deficiency, unspecified: Secondary | ICD-10-CM | POA: Insufficient documentation

## 2016-09-17 NOTE — Assessment & Plan Note (Signed)
HPI: No bleeding, doing well with coumadin.  Last INR 2.  A: Chronic PE, recurrent DVT  P: Continue coumadin.

## 2016-09-17 NOTE — Assessment & Plan Note (Signed)
History of present illness: She reports some myalgias typically more in her legs. Does not notice any associated or aggravating symptoms.  Assessment myalgias  Plan She is on a statin with appropriate indication. She is also very dark skinned and covered With closing which makes a concern for vitamin D deficiency. Therefore checked a vitamin D level it was found to be low consistent with vitamin D deficiency. I called the patient and will have her start 1000 units of vitamin D daily.

## 2016-09-17 NOTE — Assessment & Plan Note (Signed)
I reviewed the [place] controlled substances reporting system today. She has had refills 10/13, 11/15, 1/17. Overall this appears grossly appropriate she appears to have used pain medication a little bit less frequently in December. There are no other abnormalities were other providers listed.

## 2016-09-17 NOTE — Assessment & Plan Note (Signed)
History of present illness: She continues to have some knee pain of her right knee. She currently rates as 7 out of 10 however overall she feels she is doing well she's actually had excellent benefit from steroid injections intermittently. She is also a using hydrocodone acetaminophen as needed and takes about 3 pills a day. She reports that this has been very helpful in helping her to be able to tolerate some of the exercise and weight loss which is our ultimate goal to help her chronic knee pain.  Assessment primary osteoarthritis of right knee  Plan We'll continue the hydrocodone acetaminophen to use both for her knee pain and shoulder pain. Her previous UDS was positive for [facility] but she has had no other risk factors for abuse and appears to be using appropriately. She does not want a steroid injection today however I have encouraged the intermediate use and hopefully we can taper her medication some reason the steroid injections. I've also informed her that she may need orthopedic surgery although she is very fearful after complications of a previous orthopedic procedure.

## 2016-09-17 NOTE — Assessment & Plan Note (Signed)
History of present illness: She reports some mild intermittent tingling of her right second and third finger but typically happens more at night. She is right-handed. On exam she is positive for Tinel sign on the right. I reminded her of her previous diagnosis of carpal tunnel of the right. She had forgotten and stopped using the wrist splint she notes that the wrist splint did help.  Assessment carpal tunnel syndrome of the right  Plan resume use of nighttime wrist splinting

## 2016-09-17 NOTE — Assessment & Plan Note (Signed)
Wt Readings from Last 5 Encounters:  09/11/16 297 lb 12.8 oz (135.1 kg)  05/06/16 (!) 302 lb 11.2 oz (137.3 kg)  05/05/16 299 lb 12.8 oz (136 kg)  05/01/16 (!) 301 lb 14.4 oz (136.9 kg)  03/17/16 (!) 306 lb 9.6 oz (139.1 kg)     HPI has lost 5 pounds since last visit.  Assessment morbid obesity  Plan Continue to encourage weight loss.

## 2016-10-02 ENCOUNTER — Ambulatory Visit (INDEPENDENT_AMBULATORY_CARE_PROVIDER_SITE_OTHER): Payer: Medicare Other | Admitting: Pharmacist

## 2016-10-02 DIAGNOSIS — Z7901 Long term (current) use of anticoagulants: Secondary | ICD-10-CM

## 2016-10-02 DIAGNOSIS — I82533 Chronic embolism and thrombosis of popliteal vein, bilateral: Secondary | ICD-10-CM | POA: Diagnosis not present

## 2016-10-02 LAB — POCT INR: INR: 3.1

## 2016-10-02 NOTE — Patient Instructions (Signed)
Patient educated about medication as defined in this encounter and verbalized understanding by repeating back instructions provided.   

## 2016-10-02 NOTE — Progress Notes (Signed)
Anticoagulation Management Breanna Wilkins is a 56 y.o. female who reports to the clinic for monitoring of warfarin treatment.    Indication: DVT , chronic Duration: indefinite Supervising physician: Oval Linsey  Anticoagulation Clinic Visit History: Patient does not report signs/symptoms of bleeding or thromboembolism  Anticoagulation Episode Summary    Current INR goal:   2.5-3.5  TTR:   53.5 % (5.5 y)  Next INR check:   10/30/2016  INR from last check:   3.1 (10/02/2016)  Weekly max dose:     Target end date:     IN[facility] check [facility]   [facility]  Preferred lab:     Send INR reminders to:        Comments:          ASSESSMENT Recent Results: The most recent result is correlated with 37.5 mg per week: Lab Results  Component Value Date   INR 3.1 10/02/2016   INR 2.0 09/11/2016   INR 2.6 08/13/2016   Anticoagulation Dosing: INR as of 10/02/2016 and Previous Dosing Information    INR Dt INR Goal [name]   10/02/2016 3.1 2.5-3.5 37.5 mg 5 mg 5 mg 5 mg 5 mg 7.5 mg 5 mg 5 mg    Anticoagulation Dose Instructions as of 10/02/2016      Total [name]   New Dose 37.5 mg 5 mg 5 mg 5 mg 5 mg 7.5 mg 5 mg 5 mg     (5 mg x 1)  (5 mg x 1)  (5 mg x 1)  (5 mg x 1)  (5 mg x 1.5)  (5 mg x 1)  (5 mg x 1)                           INR today: Therapeutic  PLAN Weekly dose was unchanged  Patient Instructions  Patient educated about medication as defined in this encounter and verbalized understanding by repeating back instructions provided.   Patient advised to contact clinic or seek medical attention if signs/symptoms of bleeding or thromboembolism occur.  Patient verbalized understanding by repeating back information and was advised to contact me if further medication-related questions arise. Patient was also provided an information handout.  Follow-up Return in about 4 weeks (around 10/30/2016).  [name]

## 2016-10-02 NOTE — Progress Notes (Signed)
Indication: Recurrent venous thromboembolism. Duration: Indefinite. INR: At target. Agree with Dr. Julianne Rice assessment and plan.

## 2016-10-22 ENCOUNTER — Telehealth: Payer: Self-pay

## 2016-10-30 ENCOUNTER — Encounter: Payer: Self-pay | Admitting: Internal Medicine

## 2016-10-30 ENCOUNTER — Encounter (INDEPENDENT_AMBULATORY_CARE_PROVIDER_SITE_OTHER): Payer: Self-pay

## 2016-10-30 ENCOUNTER — Ambulatory Visit (INDEPENDENT_AMBULATORY_CARE_PROVIDER_SITE_OTHER): Payer: Medicare Other | Admitting: Pharmacist

## 2016-10-30 ENCOUNTER — Ambulatory Visit (INDEPENDENT_AMBULATORY_CARE_PROVIDER_SITE_OTHER): Payer: Medicare Other | Admitting: Internal Medicine

## 2016-10-30 DIAGNOSIS — I82533 Chronic embolism and thrombosis of popliteal vein, bilateral: Secondary | ICD-10-CM | POA: Diagnosis not present

## 2016-10-30 DIAGNOSIS — I2782 Chronic pulmonary embolism: Secondary | ICD-10-CM | POA: Diagnosis not present

## 2016-10-30 DIAGNOSIS — Z7901 Long term (current) use of anticoagulants: Secondary | ICD-10-CM

## 2016-10-30 DIAGNOSIS — M79602 Pain in left arm: Secondary | ICD-10-CM

## 2016-10-30 DIAGNOSIS — M25512 Pain in left shoulder: Secondary | ICD-10-CM | POA: Insufficient documentation

## 2016-10-30 LAB — POCT INR: INR: 3.5

## 2016-10-30 NOTE — Assessment & Plan Note (Signed)
Her left shoulder pain seems to be consistent with an impingement syndrome versus rotator cuff tendinopathy. Advised her to use Voltaren gel which was arty present on her medication list, but she was unaware of this medication. We discussed how to use this medication appropriately. She has a follow-up appointment with her PCP next month and will discuss with him a steroid injection if she is still having pain.

## 2016-10-30 NOTE — Progress Notes (Signed)
   CC: Left shoulder and arm pain  HPI:  [name] is a 56 y.o. woman with past medical history as noted below who presents today for evaluation of left shoulder and arm pain.  She reports 2 weeks ago she started having left shoulder and arm pain. Pain is worse while lying down. She reports the pain radiates from her left shoulder down to her forearm. She describes the pain as mild and sharp and achy in sensation. She denies any weakness, numbness, or tingling. She reports she is able to move her arm but does have associated pain with this. She denies any trauma or recent lifting of heavy objects. She reports she is on hydrocodone for other joint pains and that she has taken 1 pill at bedtime to specifically help with her shoulder/arm pain. She states she has had steroid injections in the past for other joints and noted relief with this procedure. She is interested in a steroid injection for her left shoulder.  Past Medical History:  Diagnosis Date  . Abdominal hernia   . CAD (coronary artery disease)    Perioperative non-STEMI March, 2014  //   cardiac catheterization at time of non-STEMI, March, 2014, minor nonobstructive coronary disease, vigorous LV function, possibly vasospastic event versus stress cardiomyopathy. No further workup of coronary disease  . CHF (congestive heart failure) ([place])   . Ejection fraction    65-70%, vigorous function, echo, March, 2011  . Ejection fraction    Vigorous function at time of catheterization October 06, 2012,  . Hypertension   . Lung nodule 12/2009   Very small, left upper lobe by CT June, 2011, one-year followup was stable  . OSA (obstructive sleep apnea)    CPAP  . Overweight(278.02)   . Pulmonary embolus ([place]) 01/16/10   on Coumadin indefinitely  . Shortness of breath   . Shoulder pain   . Warfarin anticoagulation     Review of Systems:   General: Denies fever, chills, night sweats, changes in weight, changes in appetite HEENT:  Denies headaches, ear pain, changes in vision, rhinorrhea, sore throat CV: Denies CP, palpitations, SOB, orthopnea Pulm: Denies SOB, cough, wheezing GI: Denies abdominal pain, nausea, vomiting, diarrhea, constipation, melena, hematochezia GU: Denies dysuria, hematuria, frequency Msk: Denies muscle cramps Neuro: See HPI Skin: Denies rashes, bruising Psych: Denies depression, anxiety, hallucinations  Physical Exam:  Vitals:   10/30/16 1005  BP: (!) 131/59  Pulse: 66  Temp: 97.5 F (36.4 C)  TempSrc: Oral  SpO2: 100%  Weight: 297 lb 9.6 oz (135 kg)  Height: 5\' 7"  (1.702 m)   General: Obese woman sitting up, NAD Ext: No limitations in ROM, but pain is elicited with Neer and Hawkins maneuvers. Mild tenderness to palpation of left shoulder joint, near acromion.  Neuro: alert and oriented x 3. Strength 5/5 in upper extremities.   Assessment & Plan:   See Encounters Tab for problem based charting.  Patient discussed with Dr. Dareen Piano

## 2016-10-30 NOTE — Progress Notes (Signed)
INTERNAL MEDICINE TEACHING ATTENDING ADDENDUM - Kendarius Vigen M.D  Duration- indefinite, Indication- PE, DVT, INR- therapeutic. Agree with pharmacy recommendations as outlined in their note.      

## 2016-10-30 NOTE — Telephone Encounter (Signed)
Helen can you please close this enounter °

## 2016-10-30 NOTE — Patient Instructions (Signed)
General Instructions: - Try using the voltaren gel - Will let Dr. [name] Westgate know you are interested in a steroid injection for your shoulder at your next visit   Please bring your medicines with you each time you come to clinic.  Medicines may include prescription medications, over-the-counter medications, herbal remedies, eye drops, vitamins, or other pills.   Progress Toward Treatment Goals:  Treatment Goal 05/29/2014  Blood pressure at goal    Self Care Goals & Plans:  Self Care Goal 02/04/2016  Manage my medications take my medicines as prescribed; bring my medications to every visit; refill my medications on time  Monitor my health keep track of my blood pressure  Eat healthy foods drink diet soda or water instead of juice or soda; eat more vegetables; eat foods that are low in salt; eat baked foods instead of fried foods; eat fruit for snacks and desserts  Be physically active find an activity I enjoy  Meeting treatment goals maintain the current self-care plan    No flowsheet data found.   Care Management & Community Referrals:  Referral 05/29/2014  Referrals made for care management support none needed  Referrals made to community resources none

## 2016-10-30 NOTE — Progress Notes (Signed)
Anticoagulation Management Breanna Wilkins is a 56 y.o. female who reports to the clinic for monitoring of warfarin treatment.    Indication: history of DVT and PE  Duration: indefinite Supervising physician: Aldine Contes  Anticoagulation Clinic Visit History: Patient does not report signs/symptoms of bleeding or thromboembolism  Anticoagulation Episode Summary    Current INR goal:   2.5-3.5  TTR:   53.5 % (5.5 y)  Next INR check:   11/13/2016  INR from last check:   3.1 (10/02/2016)  Weekly max dose:     Target end date:     INR check location[facility]   [facility]  Preferred lab:     Send INR reminders to:      Indications   Pulmonary embolus (HCC) (Resolved) [I26.99] Chronic deep vein thrombosis (DVT) of both popliteal veins (HCC) [I82.533] Chronic pulmonary embolism (HCC) [I27.82] Long-term (current) use of anticoagulants [Z79.01]       Comments:          ASSESSMENT Recent Results: The most recent result is correlated with 37.5 mg per week: Lab Results  Component Value Date   INR 3.1 10/02/2016   INR 2.0 09/11/2016   INR 2.6 08/13/2016   Anticoagulation Dosing: INR as of 10/30/2016 and Previous Dosing Information    INR Dt INR Goal [name]   10/02/2016 3.1 2.5-3.5 37.5 mg 5 mg 5 mg 5 mg 5 mg 7.5 mg 5 mg 5 mg    Anticoagulation Dose Instructions as of 10/30/2016      Total [name]   New Dose 37.5 mg 5 mg 5 mg 5 mg 5 mg 7.5 mg 5 mg 5 mg     (5 mg x 1)  (5 mg x 1)  (5 mg x 1)  (5 mg x 1)  (5 mg x 1.5)  (5 mg x 1)  (5 mg x 1)                           INR today: Therapeutic  PLAN Weekly dose was unchanged  Patient Instructions  Patient educated about medication as defined in this encounter and verbalized understanding by repeating back instructions provided.   Patient advised to contact clinic or seek medical attention if signs/symptoms of bleeding or thromboembolism occur.  Patient verbalized  understanding by repeating back information and was advised to contact me if further medication-related questions arise. Patient was also provided an information handout.  Follow-up Return in about 2 weeks (around 11/13/2016).  [name]  15 minutes spent face-to-face with the patient during the encounter. 50% of time spent on education. 50% of time was spent on assessment and plan.

## 2016-10-30 NOTE — Patient Instructions (Signed)
Patient educated about medication as defined in this encounter and verbalized understanding by repeating back instructions provided.   

## 2016-11-04 NOTE — Progress Notes (Signed)
Internal Medicine Clinic Attending  Case discussed with Dr. Rivet soon after the resident saw the patient.  We reviewed the resident's history and exam and pertinent patient test results.  I agree with the assessment, diagnosis, and plan of care documented in the resident's note.  

## 2016-11-11 ENCOUNTER — Other Ambulatory Visit: Payer: Self-pay | Admitting: Internal Medicine

## 2016-11-11 DIAGNOSIS — I509 Heart failure, unspecified: Secondary | ICD-10-CM

## 2016-11-11 NOTE — Telephone Encounter (Signed)
Refill Request  furosemide (LASIX) 80 MG tablet  carvedilol (COREG) 6.25 MG tablet

## 2016-11-12 MED ORDER — CARVEDILOL 6.25 MG PO TABS: 6.2500 mg | ORAL_TABLET | Freq: Two times a day (BID) | ORAL | 1 refills | Status: DC

## 2016-11-12 MED ORDER — FUROSEMIDE 80 MG PO TABS: 80.0000 mg | ORAL_TABLET | Freq: Every day | ORAL | 2 refills | Status: DC

## 2016-11-13 ENCOUNTER — Encounter (INDEPENDENT_AMBULATORY_CARE_PROVIDER_SITE_OTHER): Payer: Self-pay

## 2016-11-13 ENCOUNTER — Ambulatory Visit (INDEPENDENT_AMBULATORY_CARE_PROVIDER_SITE_OTHER): Payer: Medicare Other | Admitting: Pharmacist

## 2016-11-13 DIAGNOSIS — I82533 Chronic embolism and thrombosis of popliteal vein, bilateral: Secondary | ICD-10-CM

## 2016-11-13 DIAGNOSIS — I2782 Chronic pulmonary embolism: Secondary | ICD-10-CM | POA: Diagnosis not present

## 2016-11-13 DIAGNOSIS — Z7901 Long term (current) use of anticoagulants: Secondary | ICD-10-CM

## 2016-11-13 LAB — POCT INR: INR: 3.5

## 2016-11-13 NOTE — Progress Notes (Signed)
Anticoagulation Management Breanna Wilkins is a 56 y.o. female who reports to the clinic for monitoring of warfarin treatment.    Indication: history of PE and DVT with INR goal 2.0-3.0, so goal is currently 2.5-3.5 Duration: indefinite Supervising physician: Lalla Brothers  Anticoagulation Clinic Visit History: Patient does not report signs/symptoms of concern today other than mild small 1-inch bruises from exercising.  Anticoagulation Episode Summary    Current INR goal:   2.5-3.5  TTR:   54.5 % (5.6 y)  Next INR check:   12/18/2016  INR from last check:   3.5 (11/13/2016)  Weekly max dose:     Target end date:     INR check location[facility]   [facility]  Preferred lab:     Send INR reminders to:      Indications   Pulmonary embolus (HCC) (Resolved) [I26.99] Chronic deep vein thrombosis (DVT) of both popliteal veins (HCC) [I82.533] Chronic pulmonary embolism (HCC) [I27.82] Long-term (current) use of anticoagulants [Z79.01]       Comments:          ASSESSMENT Recent Results: The most recent result is correlated with 37.5 mg per week: Lab Results  Component Value Date   INR 3.5 11/13/2016   INR 3.5 10/30/2016   INR 3.1 10/02/2016   Anticoagulation Dosing: INR as of 11/13/2016 and Previous Dosing Information    INR Dt INR Goal [name]   11/13/2016 3.5 2.5-3.5 37.5 mg 5 mg 5 mg 5 mg 5 mg 7.5 mg 5 mg 5 mg    Anticoagulation Dose Instructions as of 11/13/2016      Total [name]   New Dose 37.5 mg 5 mg 5 mg 5 mg 5 mg 7.5 mg 5 mg 5 mg     (5 mg x 1)  (5 mg x 1)  (5 mg x 1)  (5 mg x 1)  (5 mg x 1.5)  (5 mg x 1)  (5 mg x 1)                           INR today: Therapeutic  PLAN Weekly dose was unchanged. Patient states she avoids green vegetables due to concern with interacting with warfarin. Provided education on dietary vitamin k intake and advised patient ok if moderate/consistent amount.  Patient Instructions   Patient educated about medication as defined in this encounter and verbalized understanding by repeating back instructions provided.   Patient advised to contact clinic or seek medical attention if signs/symptoms of bleeding or thromboembolism occur.  Patient verbalized understanding by repeating back information and was advised to contact me if further medication-related questions arise. Patient was also provided an information handout.  Follow-up Return in about 1 month (around 12/13/2016).  [name]

## 2016-11-13 NOTE — Progress Notes (Signed)
INTERNAL MEDICINE TEACHING ATTENDING ADDENDUM - Lalla Brothers M.D  I agree with pharmacy recommendations as outlined in their note.

## 2016-11-13 NOTE — Patient Instructions (Signed)
Patient educated about medication as defined in this encounter and verbalized understanding by repeating back instructions provided.   

## 2016-12-18 ENCOUNTER — Ambulatory Visit (INDEPENDENT_AMBULATORY_CARE_PROVIDER_SITE_OTHER): Payer: Medicare Other | Admitting: Internal Medicine

## 2016-12-18 ENCOUNTER — Ambulatory Visit (INDEPENDENT_AMBULATORY_CARE_PROVIDER_SITE_OTHER): Payer: Medicare Other | Admitting: Pharmacist

## 2016-12-18 ENCOUNTER — Encounter: Payer: Self-pay | Admitting: Internal Medicine

## 2016-12-18 VITALS — BP 111/64 | HR 66 | Temp 98.6°F | Ht 67.0 in | Wt 304.0 lb

## 2016-12-18 DIAGNOSIS — R7303 Prediabetes: Secondary | ICD-10-CM

## 2016-12-18 DIAGNOSIS — Z79899 Other long term (current) drug therapy: Secondary | ICD-10-CM

## 2016-12-18 DIAGNOSIS — Z7901 Long term (current) use of anticoagulants: Secondary | ICD-10-CM

## 2016-12-18 DIAGNOSIS — I82533 Chronic embolism and thrombosis of popliteal vein, bilateral: Secondary | ICD-10-CM | POA: Diagnosis not present

## 2016-12-18 DIAGNOSIS — E559 Vitamin D deficiency, unspecified: Secondary | ICD-10-CM

## 2016-12-18 DIAGNOSIS — I10 Essential (primary) hypertension: Secondary | ICD-10-CM | POA: Diagnosis not present

## 2016-12-18 DIAGNOSIS — M1711 Unilateral primary osteoarthritis, right knee: Secondary | ICD-10-CM | POA: Diagnosis not present

## 2016-12-18 DIAGNOSIS — Z6841 Body Mass Index (BMI) 40.0 and over, adult: Secondary | ICD-10-CM

## 2016-12-18 DIAGNOSIS — Z79891 Long term (current) use of opiate analgesic: Secondary | ICD-10-CM

## 2016-12-18 DIAGNOSIS — I2782 Chronic pulmonary embolism: Secondary | ICD-10-CM

## 2016-12-18 DIAGNOSIS — Z87891 Personal history of nicotine dependence: Secondary | ICD-10-CM

## 2016-12-18 LAB — POCT INR: INR: 4.9

## 2016-12-18 MED ORDER — HYDROCODONE-ACETAMINOPHEN 10-325 MG PO TABS: 1.0000 | ORAL_TABLET | Freq: Four times a day (QID) | ORAL | 0 refills | Status: DC | PRN

## 2016-12-18 NOTE — Progress Notes (Signed)
[facility] Subjective:  HPI: [name] is a 56 y.o. female who presents for follow up of chronic pain of knee and shoulder.  Please see problem based charting for the status of her chronic medical problems.  Review of Systems: Review of Systems  Constitutional: Negative for fever.  Eyes: Negative for blurred vision.  Respiratory: Negative for cough.   Cardiovascular: Negative for chest pain.  Musculoskeletal: Positive for joint pain. Negative for back pain and falls.  Endo/Heme/Allergies: Negative for polydipsia.    Objective:  Physical Exam: Vitals:   12/18/16 1019  BP: 111/64  Pulse: 66  Temp: 98.6 F (37 C)  TempSrc: Oral  SpO2: 99%  Weight: (!) 304 lb (137.9 kg)  Height: 5\' 7"  (1.702 m)  Physical Exam  Constitutional: She is well-developed, well-nourished, and in no distress. No distress.  obese  HENT:  Head: Normocephalic and atraumatic.  Eyes: Conjunctivae are normal.  Cardiovascular: Normal rate, regular rhythm, normal heart sounds and intact distal pulses.   No murmur heard. Pulmonary/Chest: Effort normal and breath sounds normal. No respiratory distress. She has no wheezes. She has no rales.  Abdominal: Soft. Bowel sounds are normal. She exhibits no distension. There is no tenderness.  Musculoskeletal: She exhibits no edema.       Right shoulder: She exhibits normal range of motion.       Left shoulder: She exhibits normal range of motion.       Right knee: She exhibits swelling. She exhibits no LCL laxity, no bony tenderness, normal meniscus and no MCL laxity. Tenderness found. Medial joint line and lateral joint line tenderness noted.  + crepitus of rgiht knee, tenderness at pes anserine insertion  Skin: Skin is warm and dry. She is not diaphoretic.  Psychiatric: Affect and judgment normal.  Nursing note and vitals reviewed.  Assessment & Plan:  Essential hypertension, benign Taking medications, no complaints.  A:  HTN  P:  Continue Lasix 80mg  daily, Lisinopril 10mg  daily. Coreg 6.25mg  BID  Primary osteoarthritis of right knee HPI: recently she has had a flare of pain of her right knee, this is after she "overdid it" by walking 2 miles.  She continues to work on weight loss but has been frustrated.  She reports she has been using the norco which has been helpful to allow her to tolerate walking but she has had to limp more recently she usually takes 2-3 times a day, more recently she has been at 3-4 times a day causing her to run out 4-5 days early.  We have been treating her orthopaedic contitions of OA of knee, and tendonopathy of shoulder with Norco as well as intermittent steroid injections which she recieves significatant relief.  A: Chronic OA of right knee likely with component of pes anserine bursitis.  P: Continue norco 10-325, no red flag behavior will increase number by 5 to 95 pills per month.  INR is supratheraptic however she may return in the afternoon someone in the next month if she would like a steroid injection.  BMI 45.0-49.9, adult [facility]) She continues to be frustrated by lack of weight loss, she is working on the diet and increasing exercise.  We may consider addition of phamacologic therapy in the future as she would not want any surgery.  Vitamin D deficiency Has been compliant with Vitamin D, leg cramps have resolved completely  A: Vit D deficiency  P: Repeat Vit D level  Prediabetes Due for repeat A1c   Medications  Ordered Meds ordered this encounter  Medications  . DISCONTD: HYDROcodone-acetaminophen (NORCO) 10-325 MG tablet    Sig: Take 1 tablet by mouth every 6 (six) hours as needed.    Dispense:  95 tablet    Refill:  0    Rx 1/3 may fill 30 days after previous script.  Marland Kitchen DISCONTD: HYDROcodone-acetaminophen (NORCO) 10-325 MG tablet    Sig: Take 1 tablet by mouth every 6 (six) hours as needed.    Dispense:  95 tablet    Refill:  0    Rx 2/3 may fill 30 days  after previous script.  Marland Kitchen HYDROcodone-acetaminophen (NORCO) 10-325 MG tablet    Sig: Take 1 tablet by mouth every 6 (six) hours as needed.    Dispense:  95 tablet    Refill:  0    Rx 3/3 may fill 30 days after previous script.   Other Orders Orders Placed This Encounter  Procedures  . Vitamin D (25 hydroxy)    Standing Status:   Future    Standing Expiration Date:   12/18/2017  . POC Hbg A1C    Standing Status:   Future    Standing Expiration Date:   01/17/2017    Scheduling Instructions:     Forgot to order during encounter, please check with INR check on Monday   Follow Up: Return in about 3 months (around 03/20/2017), or if symptoms worsen or fail to improve, may call in for steroid injection with Dr [name] Mullen, in [place] .

## 2016-12-18 NOTE — Assessment & Plan Note (Signed)
HPI: recently she has had a flare of pain of her right knee, this is after she "overdid it" by walking 2 miles.  She continues to work on weight loss but has been frustrated.  She reports she has been using the norco which has been helpful to allow her to tolerate walking but she has had to limp more recently she usually takes 2-3 times a day, more recently she has been at 3-4 times a day causing her to run out 4-5 days early.  We have been treating her orthopaedic contitions of OA of knee, and tendonopathy of shoulder with Norco as well as intermittent steroid injections which she recieves significatant relief.  A: Chronic OA of right knee likely with component of pes anserine bursitis.  P: Continue norco 10-325, no red flag behavior will increase number by 5 to 95 pills per month.  INR is supratheraptic however she may return in the afternoon someone in the next month if she would like a steroid injection.

## 2016-12-18 NOTE — Assessment & Plan Note (Signed)
Due for repeat A1c.

## 2016-12-18 NOTE — Patient Instructions (Signed)
Please keep up the good work with exercising. We can do a steroid injection to you knee when you want it and your INR is in the normal range.

## 2016-12-18 NOTE — Progress Notes (Signed)
Anticoagulation Management Breanna Wilkins is a 56 y.o. female who reports to the clinic for monitoring of warfarin treatment.    Indication: history of DVT and PE  Duration: indefinite Supervising physician: [place] [facility] Visit History: Patient does not report signs/symptoms of bleeding or thromboembolism  Anticoagulation Episode Summary    Current INR goal:   2.5-3.5  TTR:   53.6 % (5.7 y)  Next INR check:   12/22/2016  INR from last check:   4.9! (12/18/2016)  Weekly max warfarin dose:     Target end date:     [facility] check [facility]   [facility]  Preferred lab:     Send INR reminders to:      Indications   Pulmonary embolus (HCC) (Resolved) [I26.99] Chronic deep vein thrombosis (DVT) of both popliteal veins (HCC) [I82.533] Chronic pulmonary embolism (HCC) [I27.82] Long-term (current) use of anticoagulants [Z79.01]       Comments:          ASSESSMENT Recent Results: The most recent result is correlated with 37.5 mg per week: Lab Results  Component Value Date   INR 4.9 12/18/2016   INR 3.5 11/13/2016   INR 3.5 10/30/2016   Anticoagulation Dosing: INR as of 12/18/2016 and Previous Warfarin Dosing Information    INR Dt INR Goal [name]   12/18/2016 4.9 2.5-3.5 37.5 mg 5 mg 5 mg 5 mg 5 mg 7.5 mg 5 mg 5 mg    Anticoagulation Warfarin Dose Instructions as of 12/18/2016      Total [name]   New Dose 25 mg 5 mg 5 mg 5 mg 5 mg 0 mg 0 mg 5 mg     (5 mg x 1)  (5 mg x 1)  (5 mg x 1)  (5 mg x 1)  -  -  (5 mg x 1)                           INR today: Supratherapeutic  PLAN Hold today and tomorrow, follow up in clinic on Monday  There are no Patient Instructions on file for this visit. Patient advised to contact clinic or seek medical attention if signs/symptoms of bleeding or thromboembolism occur.  Patient verbalized understanding by repeating back information and was advised to contact  me if further medication-related questions arise. Patient was also provided an information handout.  Follow-up Return in about 4 days (around 12/22/2016).  [name]

## 2016-12-18 NOTE — Assessment & Plan Note (Signed)
Taking medications, no complaints.  A: HTN  P:  Continue Lasix 80mg  daily, Lisinopril 10mg  daily. Coreg 6.25mg  BID

## 2016-12-18 NOTE — Assessment & Plan Note (Signed)
She continues to be frustrated by lack of weight loss, she is working on the diet and increasing exercise.  We may consider addition of phamacologic therapy in the future as she would not want any surgery.

## 2016-12-18 NOTE — Assessment & Plan Note (Signed)
Has been compliant with Vitamin D, leg cramps have resolved completely  A: Vit D deficiency  P: Repeat Vit D level

## 2016-12-22 ENCOUNTER — Ambulatory Visit (INDEPENDENT_AMBULATORY_CARE_PROVIDER_SITE_OTHER): Payer: Medicare Other | Admitting: Pharmacist

## 2016-12-22 ENCOUNTER — Other Ambulatory Visit (INDEPENDENT_AMBULATORY_CARE_PROVIDER_SITE_OTHER): Payer: Medicare Other

## 2016-12-22 DIAGNOSIS — I2782 Chronic pulmonary embolism: Secondary | ICD-10-CM | POA: Diagnosis not present

## 2016-12-22 DIAGNOSIS — I2609 Other pulmonary embolism with acute cor pulmonale: Secondary | ICD-10-CM

## 2016-12-22 DIAGNOSIS — Z7901 Long term (current) use of anticoagulants: Secondary | ICD-10-CM

## 2016-12-22 DIAGNOSIS — I82533 Chronic embolism and thrombosis of popliteal vein, bilateral: Secondary | ICD-10-CM | POA: Diagnosis not present

## 2016-12-22 DIAGNOSIS — R7303 Prediabetes: Secondary | ICD-10-CM | POA: Diagnosis not present

## 2016-12-22 DIAGNOSIS — E559 Vitamin D deficiency, unspecified: Secondary | ICD-10-CM

## 2016-12-22 LAB — GLUCOSE, CAPILLARY: Glucose-Capillary: 84 mg/dL (ref 65–99)

## 2016-12-22 LAB — POCT GLYCOSYLATED HEMOGLOBIN (HGB A1C): Hemoglobin A1C: 5.9

## 2016-12-22 LAB — POCT INR: INR: 2.1

## 2016-12-22 NOTE — Progress Notes (Signed)
INTERNAL MEDICINE TEACHING ATTENDING ADDENDUM - Langford Carias M.D  °Duration- indefinite, Indication- DVT, PE, INR- therapeutic. Agree with pharmacy recommendations as outlined in their note.  ° ° ° °

## 2016-12-22 NOTE — Patient Instructions (Signed)
Patient instructed to take medications as defined in the Anti-coagulation Track section of this encounter.  Patient instructed to take today's dose.  Patient instructed to take one (1) tablet by mouth once-daily at [facility]. Will re-evaluate your response to this regimen in 2 weeks. Patient verbalized understanding of these instructions.

## 2016-12-22 NOTE — Progress Notes (Signed)
Anti-Coagulation Progress Note  Breanna Wilkins is a 56 y.o. female who is currently on an anti-coagulation regimen.    RECENT RESULTS: Recent results are below, the most recent result is correlated with a dose of 25 mg. per week: Lab Results  Component Value Date   INR 2.10 12/22/2016   INR 4.9 12/18/2016   INR 3.5 11/13/2016    ANTI-COAG DOSE: Anticoagulation Warfarin Dose Instructions as of 12/22/2016      [name] Tue Wed Thu Fri Sat   New Dose 5 mg 5 mg 5 mg 5 mg 5 mg 5 mg 5 mg    Description   Take one (1) tablet by mouth once-daily at [facility]. Will re-evaluate your response to this regimen in 2 weeks.       ANTICOAG SUMMARY: Anticoagulation Episode Summary    Current INR goal:   2.5-3.5  TTR:   53.5 % (5.7 y)  Next INR check:   01/05/2017  INR from last check:   2.10! (12/22/2016)  Weekly max warfarin dose:     Target end date:     [facility]  Preferred lab:     Send INR reminders to:      Indications   Pulmonary embolus (HCC) (Resolved) [I26.99] Chronic deep vein thrombosis (DVT) of both popliteal veins (HCC) [I82.533] Chronic pulmonary embolism (HCC) [I27.82] Long-term (current) use of anticoagulants [Z79.01]       Comments:           ANTICOAG TODAY: Anticoagulation Summary  As of 12/22/2016   INR goal:   2.5-3.5  TTR:     Today's INR:   2.10!  Next INR check:   01/05/2017  Target end date:      Indications   Pulmonary embolus (HCC) (Resolved) [I26.99] Chronic deep vein thrombosis (DVT) of both popliteal veins (HCC) [I82.533] Chronic pulmonary embolism (HCC) [I27.82] Long-term (current) use of anticoagulants [Z79.01]        Anticoagulation Episode Summary    [facility]   [facility]   Preferred lab:      Send INR reminders to:      Comments:         PATIENT INSTRUCTIONS: There are no Patient Instructions on file for this visit.   FOLLOW-UP Return in 2 weeks (on 01/05/2017) for Follow up INR at  1000h.  [name], III Pharm.D., CACP

## 2016-12-23 LAB — VITAMIN D 25 HYDROXY (VIT D DEFICIENCY, FRACTURES): Vit D, 25-Hydroxy: 25.4 ng/mL — ABNORMAL LOW (ref 30.0–100.0)

## 2017-01-05 ENCOUNTER — Ambulatory Visit (INDEPENDENT_AMBULATORY_CARE_PROVIDER_SITE_OTHER): Payer: Medicare Other | Admitting: Pharmacist

## 2017-01-05 DIAGNOSIS — Z7901 Long term (current) use of anticoagulants: Secondary | ICD-10-CM

## 2017-01-05 DIAGNOSIS — I2782 Chronic pulmonary embolism: Secondary | ICD-10-CM | POA: Diagnosis not present

## 2017-01-05 DIAGNOSIS — I82533 Chronic embolism and thrombosis of popliteal vein, bilateral: Secondary | ICD-10-CM | POA: Diagnosis not present

## 2017-01-05 LAB — POCT INR: INR: 4.1

## 2017-01-05 NOTE — Patient Instructions (Signed)
Patient instructed to take medications as defined in the Anti-coagulation Track section of this encounter.  Patient instructed to take today's dose.  Patient instructed to take one (1) tablet by mouth once-daily all days except Monday and Thursday take 0.5 tablets of your 5mg peach colored tablets at 6PM.  Patient verbalized understanding of these instructions.    

## 2017-01-05 NOTE — Progress Notes (Signed)
Anticoagulation Management Breanna Wilkins is a 56 y.o. female who reports to the clinic for monitoring of warfarin treatment.    Indication: DVT and PE  Duration: indefinite Supervising physician: Gilles Chiquito  Anticoagulation Clinic Visit History: Patient does not report signs/symptoms of bleeding or thromboembolism  Other recent changes: No changes to diet, medications, lifestyle Anticoagulation Episode Summary    Current INR goal:   2.5-3.5  TTR:   53.5 % (5.7 y)  Next INR check:   01/12/2017  INR from last check:   4.1! (01/05/2017)  Weekly max warfarin dose:     Target end date:     [facility]  Preferred lab:     Send INR reminders to:      Indications   Pulmonary embolus (HCC) (Resolved) [I26.99] Chronic deep vein thrombosis (DVT) of both popliteal veins (HCC) [I82.533] Chronic pulmonary embolism (HCC) [I27.82] Long-term (current) use of anticoagulants [Z79.01]       Comments:          ASSESSMENT Recent Results: The most recent result is correlated with 35 mg per week: Lab Results  Component Value Date   INR 4.1 01/05/2017   INR 2.10 12/22/2016   INR 4.9 12/18/2016    Anticoagulation Dosing: INR as of 01/05/2017 and Previous Warfarin Dosing Information    INR Dt INR Goal Wkly Tot [name]   01/05/2017 4.1 2.5-3.5 35 mg 5 mg 5 mg 5 mg 5 mg 5 mg 5 mg 5 mg    Previous description   Take one (1) tablet by mouth once-daily at 6PM. Will re-evaluate your response to this regimen in 2 weeks.    Anticoagulation Warfarin Dose Instructions as of 01/05/2017      Total [name]   New Dose 30 mg 5 mg 2.5 mg 5 mg 5 mg 2.5 mg 5 mg 5 mg     (5 mg x 1)  (5 mg x 0.5)  (5 mg x 1)  (5 mg x 1)  (5 mg x 0.5)  (5 mg x 1)  (5 mg x 1)                         Description   Take one (1) tablet by mouth once-daily all days except Monday and Thursday take 0.5 tablets of your 5mg  peach colored tablets at 6PM.       INR today: Supratherapeutic  PLAN Weekly dose was decreased by 14% to 30 mg per week  Patient Instructions  Patient instructed to take medications as defined in the Anti-coagulation Track section of this encounter.  Patient instructed to take today's dose.  Patient instructed to take one (1) tablet by mouth once-daily all days except Monday and Thursday take 0.5 tablets of your 5mg  peach colored tablets at [facility].  Patient verbalized understanding of these instructions.     Patient advised to contact clinic or seek medical attention if signs/symptoms of bleeding or thromboembolism occur.  Patient verbalized understanding by repeating back information and was advised to contact me if further medication-related questions arise. Patient was also provided an information handout.  Follow-up Return in about 7 days (around 01/12/2017) for Follow up INR at 1000h.  Caryl Bis PharmD, [place], PharmD Candidate  15 minutes spent face-to-face with the patient during the encounter. 50% of time spent on education. 50% of time was spent  on point of care INR sample, interpretation, and data entry.

## 2017-01-06 NOTE — Progress Notes (Signed)
I reviewed Dr. Gladstone Pih note.  [name] is on [facility] for DVT/PE, INR was elevated and coumadin dose was decreased.

## 2017-01-12 ENCOUNTER — Ambulatory Visit (INDEPENDENT_AMBULATORY_CARE_PROVIDER_SITE_OTHER): Payer: Medicare Other | Admitting: Pharmacist

## 2017-01-12 DIAGNOSIS — Z7901 Long term (current) use of anticoagulants: Secondary | ICD-10-CM | POA: Diagnosis not present

## 2017-01-12 DIAGNOSIS — I82533 Chronic embolism and thrombosis of popliteal vein, bilateral: Secondary | ICD-10-CM

## 2017-01-12 DIAGNOSIS — I2782 Chronic pulmonary embolism: Secondary | ICD-10-CM | POA: Diagnosis not present

## 2017-01-12 LAB — POCT INR: INR: 3.2

## 2017-01-12 NOTE — Patient Instructions (Signed)
Patient instructed to take medications as defined in the Anti-coagulation Track section of this encounter.  Patient instructed to take today's dose.  Patient instructed to take one (1) tablet by mouth once-daily all days except Monday and Thursday take 0.5 tablets of your 5mg peach colored tablets at 6PM.  Patient verbalized understanding of these instructions.    

## 2017-01-12 NOTE — Progress Notes (Signed)
Anticoagulation Management Breanna Wilkins is a 56 y.o. female who reports to the clinic for monitoring of warfarin treatment.    Indication: PE, history of, with recurrences Duration: indefinite Supervising physician: [place] [facility] Visit History: Patient does not report signs/symptoms of bleeding or thromboembolism  Other recent changes: No diet, medications, lifestyle changes endorsed.  Anticoagulation Episode Summary    Current INR goal:   2.5-3.5  TTR:   53.5 % (5.7 y)  Next INR check:   02/09/2017  INR from last check:   3.20 (01/12/2017)  Weekly max warfarin dose:     Target end date:     [facility]R check [facility]  Preferred lab:     Send INR reminders to:      Indications   Pulmonary embolus (HCC) (Resolved) [I26.99] Chronic deep vein thrombosis (DVT) of both popliteal veins (HCC) [I82.533] Chronic pulmonary embolism (HCC) [I27.82] Long-term (current) use of anticoagulants [Z79.01]       Comments:          ASSESSMENT Recent Results: The most recent result is correlated with 30 mg per week: Lab Results  Component Value Date   INR 3.20 01/12/2017   INR 4.1 01/05/2017   INR 2.10 12/22/2016    Anticoagulation Dosing: INR as of 01/12/2017 and Previous Warfarin Dosing Information    INR Dt INR Goal [name]   01/12/2017 3.20 2.5-3.5 30 mg 5 mg 2.5 mg 5 mg 5 mg 2.5 mg 5 mg 5 mg    Previous description   Take one (1) tablet by mouth once-daily all days except Monday and Thursday take 0.5 tablets of your 5mg  peach colored tablets at 6PM.    Anticoagulation Warfarin Dose Instructions as of 01/12/2017      Total [name]   New Dose 30 mg 5 mg 2.5 mg 5 mg 5 mg 2.5 mg 5 mg 5 mg     (5 mg x 1)  (5 mg x 0.5)  (5 mg x 1)  (5 mg x 1)  (5 mg x 0.5)  (5 mg x 1)  (5 mg x 1)                         Description   Take one (1) tablet by mouth once-daily all days except Monday and Thursday  take 0.5 tablets of your 5mg  peach colored tablets at 6PM.      INR today: Therapeutic  PLAN Weekly dose was unchanged.  Patient Instructions  Patient instructed to take medications as defined in the Anti-coagulation Track section of this encounter.  Patient instructed to take today's dose.  Patient instructed to take one (1) tablet by mouth once-daily all days except Monday and Thursday take 0.5 tablets of your 5mg  peach colored tablets at [name]  Patient verbalized understanding of these instructions.     Patient advised to contact clinic or seek medical attention if signs/symptoms of bleeding or thromboembolism occur.  Patient verbalized understanding by repeating back information and was advised to contact me if further medication-related questions arise. Patient was also provided an information handout.  Follow-up Return in 4 weeks (on 02/09/2017) for Follow up INR at 1015h.  Caryl Bis PharmD, CACP, CPP  15 minutes spent face-to-face with the patient during the encounter. 50% of time spent on education. 50% of time was spent on finger-stick, point of  care sample collection, processing, interpretation and data-entry.

## 2017-01-13 NOTE — Progress Notes (Signed)
I reviewed Dr. Gladstone Pih note and plan.  Patient is on coumadin for recurrent PE.  She apparently had a DVT while therapeutic, hence the elevated goal.

## 2017-02-01 ENCOUNTER — Other Ambulatory Visit: Payer: Self-pay | Admitting: Internal Medicine

## 2017-02-01 DIAGNOSIS — I82439 Acute embolism and thrombosis of unspecified popliteal vein: Secondary | ICD-10-CM

## 2017-02-09 ENCOUNTER — Ambulatory Visit (INDEPENDENT_AMBULATORY_CARE_PROVIDER_SITE_OTHER): Payer: Medicare Other | Admitting: Pharmacist

## 2017-02-09 DIAGNOSIS — I82533 Chronic embolism and thrombosis of popliteal vein, bilateral: Secondary | ICD-10-CM

## 2017-02-09 DIAGNOSIS — Z7901 Long term (current) use of anticoagulants: Secondary | ICD-10-CM

## 2017-02-09 DIAGNOSIS — I2782 Chronic pulmonary embolism: Secondary | ICD-10-CM

## 2017-02-09 LAB — POCT INR: INR: 2.7

## 2017-02-09 NOTE — Patient Instructions (Signed)
Patient instructed to take medications as defined in the Anti-coagulation Track section of this encounter.  Patient instructed to take today's dose.  Patient instructed to take one (1) tablet by mouth once-daily all days except Monday and Thursday take 0.5 tablets of your 5mg peach colored tablets at 6PM.  Patient verbalized understanding of these instructions.    

## 2017-02-09 NOTE — Progress Notes (Signed)
Anticoagulation Management Breanna Wilkins is a 57 y.o. female who reports to the clinic for monitoring of warfarin treatment.    Indication:DVT and PE, history of, chronic anticoagulation for secondary prophylaxis.  Duration: indefinite Supervising physician: [place] [facility] Visit History: Patient does not report signs/symptoms of bleeding or thromboembolism  Other recent changes: No diet, medications, lifestyle changes endorsed by the patient.  Anticoagulation Episode Summary    Current INR goal:   2.5-3.5  TTR:   54.1 % (5.8 y)  Next INR check:   03/09/2017  INR from last check:   2.70 (02/09/2017)  Weekly max warfarin dose:     Target end date:     [facility] check [facility]  Preferred lab:     Send INR reminders to:      Indications   Pulmonary embolus (HCC) (Resolved) [I26.99] Chronic deep vein thrombosis (DVT) of both popliteal veins (HCC) [I82.533] Chronic pulmonary embolism (HCC) [I27.82] Long-term (current) use of anticoagulants [Z79.01]       Comments:          ASSESSMENT Recent Results: The most recent result is correlated with 30 mg per week: Lab Results  Component Value Date   INR 2.70 02/09/2017   INR 3.20 01/12/2017   INR 4.1 01/05/2017    Anticoagulation Dosing: INR as of 02/09/2017 and Previous Warfarin Dosing Information    INR Dt INR Goal [name]   02/09/2017 2.70 2.5-3.5 30 mg 5 mg 2.5 mg 5 mg 5 mg 2.5 mg 5 mg 5 mg    Previous description   Take one (1) tablet by mouth once-daily all days except Monday and Thursday take 0.5 tablets of your 5mg  peach colored tablets at 6PM.    Anticoagulation Warfarin Dose Instructions as of 02/09/2017      Total [name]   New Dose 30 mg 5 mg 2.5 mg 5 mg 5 mg 2.5 mg 5 mg 5 mg     (5 mg x 1)  (5 mg x 0.5)  (5 mg x 1)  (5 mg x 1)  (5 mg x 0.5)  (5 mg x 1)  (5 mg x 1)                         Description   Take one (1)  tablet by mouth once-daily all days except Monday and Thursday take 0.5 tablets of your 5mg  peach colored tablets at 6PM.      INR today: Therapeutic  PLAN Weekly dose was unchanged.  Patient Instructions  Patient instructed to take medications as defined in the Anti-coagulation Track section of this encounter.  Patient instructed to take today's dose.  Patient instructed to take one (1) tablet by mouth once-daily all days except Monday and Thursday take 0.5 tablets of your 5mg  peach colored tablets at [facility]  Patient verbalized understanding of these instructions.     Patient advised to contact clinic or seek medical attention if signs/symptoms of bleeding or thromboembolism occur.  Patient verbalized understanding by repeating back information and was advised to contact me if further medication-related questions arise. Patient was also provided an information handout.  Follow-up Return in 4 weeks (on 03/09/2017) for Follow up INR at 1000h.  Caryl Bis PharmD, CACP, CPP  15 minutes spent face-to-face with the patient during the encounter. 50% of time spent on education. 50%  of time was spent on finger-stick point of care INR sample collection, processing, interpretation and discussion of results with the patient, documentation in EPIC/CHL and [email or web address]

## 2017-03-09 ENCOUNTER — Ambulatory Visit: Payer: Medicare Other

## 2017-03-12 ENCOUNTER — Ambulatory Visit (INDEPENDENT_AMBULATORY_CARE_PROVIDER_SITE_OTHER): Payer: Medicare Other | Admitting: Internal Medicine

## 2017-03-12 ENCOUNTER — Encounter: Payer: Self-pay | Admitting: Internal Medicine

## 2017-03-12 ENCOUNTER — Other Ambulatory Visit (INDEPENDENT_AMBULATORY_CARE_PROVIDER_SITE_OTHER): Payer: Medicare Other

## 2017-03-12 VITALS — BP 146/85 | HR 70 | Temp 97.5°F | Ht 67.0 in | Wt 307.8 lb

## 2017-03-12 DIAGNOSIS — Z87891 Personal history of nicotine dependence: Secondary | ICD-10-CM

## 2017-03-12 DIAGNOSIS — Z6841 Body Mass Index (BMI) 40.0 and over, adult: Secondary | ICD-10-CM

## 2017-03-12 DIAGNOSIS — E669 Obesity, unspecified: Secondary | ICD-10-CM

## 2017-03-12 DIAGNOSIS — M25519 Pain in unspecified shoulder: Secondary | ICD-10-CM

## 2017-03-12 DIAGNOSIS — Z7901 Long term (current) use of anticoagulants: Secondary | ICD-10-CM | POA: Diagnosis not present

## 2017-03-12 DIAGNOSIS — G8929 Other chronic pain: Secondary | ICD-10-CM | POA: Diagnosis not present

## 2017-03-12 DIAGNOSIS — I82533 Chronic embolism and thrombosis of popliteal vein, bilateral: Secondary | ICD-10-CM | POA: Diagnosis not present

## 2017-03-12 DIAGNOSIS — I2782 Chronic pulmonary embolism: Secondary | ICD-10-CM

## 2017-03-12 DIAGNOSIS — Z79899 Other long term (current) drug therapy: Secondary | ICD-10-CM | POA: Diagnosis not present

## 2017-03-12 DIAGNOSIS — Z8249 Family history of ischemic heart disease and other diseases of the circulatory system: Secondary | ICD-10-CM

## 2017-03-12 DIAGNOSIS — Z833 Family history of diabetes mellitus: Secondary | ICD-10-CM

## 2017-03-12 DIAGNOSIS — Z7982 Long term (current) use of aspirin: Secondary | ICD-10-CM

## 2017-03-12 DIAGNOSIS — M1711 Unilateral primary osteoarthritis, right knee: Secondary | ICD-10-CM | POA: Diagnosis not present

## 2017-03-12 DIAGNOSIS — Z79891 Long term (current) use of opiate analgesic: Secondary | ICD-10-CM | POA: Diagnosis not present

## 2017-03-12 DIAGNOSIS — Z808 Family history of malignant neoplasm of other organs or systems: Secondary | ICD-10-CM

## 2017-03-12 DIAGNOSIS — Z8041 Family history of malignant neoplasm of ovary: Secondary | ICD-10-CM

## 2017-03-12 LAB — POCT INR: INR: 3.4

## 2017-03-12 MED ORDER — HYDROCODONE-ACETAMINOPHEN 10-325 MG PO TABS: 1.0000 | ORAL_TABLET | Freq: Four times a day (QID) | ORAL | 0 refills | Status: DC | PRN

## 2017-03-12 NOTE — Patient Instructions (Signed)
Please call with any increasing pain in right knee.

## 2017-03-12 NOTE — Assessment & Plan Note (Signed)
Discussed again importance of weight loss on HTN, Prediabetes and OA.

## 2017-03-12 NOTE — Assessment & Plan Note (Signed)
INR has been stable in goal range, given knee injection today I recommended she skip tonights dose and resume usual schedule tomorrow.

## 2017-03-12 NOTE — Progress Notes (Signed)
[facility] Subjective:  HPI: [name] is a 56 y.o. female who presents for right knee pain  Please see Assessment and Plan below for the status of her chronic medical problems.  Review of Systems: Review of Systems  Constitutional: Negative for fever.  Respiratory: Negative for cough.   Cardiovascular: Negative for chest pain.  Musculoskeletal: Positive for joint pain. Negative for falls.  Neurological: Negative for dizziness.  Endo/Heme/Allergies: Negative for polydipsia.  Psychiatric/Behavioral: Negative for depression.    Objective:  Physical Exam: Vitals:   03/12/17 1011  BP: (!) 146/85  Pulse: 70  Temp: (!) 97.5 F (36.4 C)  TempSrc: Oral  SpO2: 99%  Weight: (!) 307 lb 12.8 oz (139.6 kg)  Height: 5\' 7"  (1.702 m)  Physical Exam  Constitutional: No distress.  obese  Cardiovascular: Normal rate, regular rhythm and normal heart sounds.   Pulmonary/Chest: Effort normal and breath sounds normal. No respiratory distress. She has no wheezes.  Abdominal: Soft. Bowel sounds are normal.  Musculoskeletal: She exhibits no edema.       Right knee: She exhibits normal range of motion and no effusion. Tenderness found. Medial joint line and lateral joint line tenderness noted.       Left knee: She exhibits normal range of motion.  Nursing note and vitals reviewed.  Assessment & Plan:  Primary osteoarthritis of right knee HPI: Reports right knee has been hurting more, this has caused her to use more of the Norco to help her tolerate moving around, still some days it is even worse.  Reports <15 mins of morning stiffness, worse at end of day.  A: OA of right knee  P: Discussed she has put back on weight, needs to recommit to weight loss.  Can continue Norco as needed for knee as well as shoulder pain, want to minimize, she has had great benefit from steroid injections in the past wants one today. I discussed at length with nurse Lela present her  INR is 3.4 today which is in her theraputic range but does put her at a increased chance of hemarthorsis with joint injection, she could return tomorrow or early next week and would be safer, however she really wants it done today.  She has tolerated other injections very well with minimal to no bleeding.  There we discussed going forward but to use 27 gauge needle to minimize chance of complications.  PROCEDURE NOTE  PROCEDURE: right knee joint steroid injection.  PREOPERATIVE DIAGNOSIS: Osteoarthritis of the right knee.  POSTOPERATIVE DIAGNOSIS: Osteoarthritis of the right knee.  PROCEDURE: The patient was apprised of the risks and the benefits of the procedure and informed consent was obtained, as witnessed by Lela. Time-out procedure was performed, with confirmation of the patient's name, date of birth, and correct identification of the right knee to be injected. The patient's knee was then marked at the appropriate site for injection placement. The knee was sterilely prepped with Betadine. A 40 mg (1 milliliter) solution of Kenalog was drawn up into a 5 mL syringe with a 43mL of 1% lidocaine. The patient was injected with a 27-gauge needle at the anterior medial aspect of her right flexed knee. There were no complications. The patient tolerated the procedure well. There was no bleeding. The patient was instructed to ice her knee upon leaving clinic and refrain from overuse over the next 3 days. The patient was instructed to go to the emergency room with any usual pain, swelling, or redness occurred in  the injected area. The patient was given a followup appointment to evaluate response to the injection to his increased range of motion and reduction of pain.    Long term (current) use of anticoagulants INR has been stable in goal range, given knee injection today I recommended she skip tonights dose and resume usual schedule tomorrow.  BMI 45.0-49.9, adult [facility]) Discussed again importance of  weight loss on HTN, Prediabetes and OA.  Long term (current) use of opiate analgesic HPI: She continues to use Hyrocodone 10-325 for chronic knee and shoulder pain.  Has done well with #95 per month allowing her to take 3 pill most days with a few extra pills for bad days where she needs 4.  Otherwise she has had no red flag behavior.    A: Chronic pain treated with opiate analgesic  P:Refilled 3 months of Norco 10-325 #95.  Discussed using fewer than prescribed especially as steroid injection helps to decrease use.   Medications Ordered Meds ordered this encounter  Medications  . DISCONTD: HYDROcodone-acetaminophen (NORCO) 10-325 MG tablet    Sig: Take 1 tablet by mouth every 6 (six) hours as needed.    Dispense:  95 tablet    Refill:  0    Rx 1/3 may fill 30 days after previous script.  Marland Kitchen DISCONTD: HYDROcodone-acetaminophen (NORCO) 10-325 MG tablet    Sig: Take 1 tablet by mouth every 6 (six) hours as needed.    Dispense:  95 tablet    Refill:  0    Rx 2/3 may fill 30 days after previous script.  Marland Kitchen HYDROcodone-acetaminophen (NORCO) 10-325 MG tablet    Sig: Take 1 tablet by mouth every 6 (six) hours as needed.    Dispense:  95 tablet    Refill:  0    Rx 3/3 may fill 30 days after previous script.   Other Orders No orders of the defined types were placed in this encounter.  Follow Up: Return in about 3 months (around 06/12/2017).

## 2017-03-12 NOTE — Progress Notes (Signed)
icoagulation Management Breanna Wilkins is a 56 y.o. female who reports to the clinic for monitoring of warfarin treatment.    Indication:DVT and PE, history of, chronic anticoagulation for secondary prophylaxis.  Duration: indefinite Supervising physician: Joni Reining  Anticoagulation Clinic Visit History: Patient does not report signs/symptoms of bleeding or thromboemboliam Anticoagulation Episode Summary    Current INR goal:   2.5-3.5  TTR:   54.7 % (5.9 y)  Next INR check:   04/13/2017  INR from last check:   3.4 (03/12/2017)  Weekly max warfarin dose:     Target end date:     INR check [facility]   [facility]  Preferred lab:     Send INR reminders to:      Indications   Pulmonary embolus (HCC) (Resolved) [I26.99] Chronic deep vein thrombosis (DVT) of both popliteal veins (HCC) [I82.533] Chronic pulmonary embolism ([place]) [I27.82] Long term (current) use of anticoagulants [Z79.01]       Comments:           No Known Allergies Medication Sig Start Date End Date Taking? Authorizing Provider  aspirin 81 MG chewable tablet Chew 1 tablet (81 mg total) by mouth daily. 10/08/12   Hosie Poisson, MD  [place] 5-2.5-18.5 injection  05/29/14   [provider]  carvedilol (COREG) 6.25 MG tablet Take 1 tablet (6.25 mg total) by mouth 2 (two) times daily with a meal. 11/12/16   Lucious Groves, DO  diclofenac sodium (VOLTAREN) 1 % GEL Apply 2 g topically 4 (four) times daily. 09/11/16   Lucious Groves, DO  furosemide (LASIX) 80 MG tablet Take 1 tablet (80 mg total) by mouth daily. 11/12/16   Lucious Groves, DO  HYDROcodone-acetaminophen (NORCO) 10-325 MG tablet Take 1 tablet by mouth every 6 (six) hours as needed. 03/12/17   Lucious Groves, DO  lisinopril (PRINIVIL,ZESTRIL) 10 MG tablet Take 1 tablet (10 mg total) by mouth daily. 04/28/16 04/28/17  Lucious Groves, DO  pantoprazole (PROTONIX) 40 MG tablet TAKE 1 TABLET BY MOUTH 30 MINUTES BEFORE LUNCH OR DINNER 05/09/16    Lucious Groves, DO  rosuvastatin (CRESTOR) 5 MG tablet Take 1 tablet (5 mg total) by mouth at bedtime. 06/06/16   Sid Falcon, MD  triamcinolone (KENALOG) 0.025 % ointment Apply twice daily for 2 weeks then stop 11/20/15   Loleta Chance, MD  warfarin (COUMADIN) 5 MG tablet Take one tablet by mouth once daily except on Mondays & Thursdays- take one-half tablet on Mondays & Thursdays 02/04/17   Bartholomew Crews, MD   Past Medical History:  Diagnosis Date  . Abdominal hernia   . CAD (coronary artery disease)    Perioperative non-STEMI March, 2014  //   cardiac catheterization at time of non-STEMI, March, 2014, minor nonobstructive coronary disease, vigorous LV function, possibly vasospastic event versus stress cardiomyopathy. No further workup of coronary disease  . CHF (congestive heart failure) ([place])   . Ejection fraction    65-70%, vigorous function, echo, March, 2011  . Ejection fraction    Vigorous function at time of catheterization October 06, 2012,  . Hypertension   . Lung nodule 12/2009   Very small, left upper lobe by CT June, 2011, one-year followup was stable  . OSA (obstructive sleep apnea)    CPAP  . Overweight(278.02)   . Pulmonary embolus ([place]) 01/16/10   on Coumadin indefinitely  . Shortness of breath   . Shoulder pain   . Warfarin anticoagulation  Social History   Social History  . Marital status: Single    Spouse name: N/A  . Number of children: N/A  . Years of education: N/A   Social History Main Topics  . Smoking status: Former Research scientist (life sciences)  . Smokeless tobacco: Never Used     Comment: quit 12 years ago  . Alcohol use 3.6 oz/week    6 Cans of beer per week     Comment: Beer sometimes.  . Drug use: No     Comment: smoked joint 2 weeks ago  . Sexual activity: Not Asked   Other Topics Concern  . None   Social History Narrative   Financial assistance approved for 100% discount at [facility] and has [facility] card per [employer]   12/25/2009   Family History   Problem Relation Age of Onset  . Diabetes Father   . Diabetes Sister   . Diabetes Mother   . Ovarian cancer Mother   . Deep vein thrombosis Sister   . Bone cancer Maternal Grandmother    ASSESSMENT Recent Results Lab Results  Component Value Date   INR 3.4 03/12/2017   INR 2.70 02/09/2017   INR 3.20 01/12/2017   Anticoagulation Dosing: INR as of 03/12/2017 and Previous Warfarin Dosing Information    INR Dt INR Goal [name]   03/12/2017 3.4 2.5-3.5 30 mg 5 mg 2.5 mg 5 mg 5 mg 2.5 mg 5 mg 5 mg    Previous description   Take one (1) tablet by mouth once-daily all days except Monday and Thursday take 0.5 tablets of your 5mg  peach colored tablets at 6PM.    Anticoagulation Warfarin Dose Instructions as of 03/12/2017      Total [name]   New Dose 30 mg 5 mg 2.5 mg 5 mg 5 mg 2.5 mg 5 mg 5 mg     (5 mg x 1)  (5 mg x 0.5)  (5 mg x 1)  (5 mg x 1)  (5 mg x 0.5)  (5 mg x 1)  (5 mg x 1)                         Description   Take one (1) tablet by mouth once-daily all days except Monday and Thursday take 0.5 tablets of your 5mg  peach colored tablets at 6PM.      INR today: therapeutic  PLAN Weekly dose was unchanged  Patient Instructions  Please call with any increasing pain in right knee.  Patient advised to contact clinic or seek medical attention if signs/symptoms of bleeding or thromboembolism occur.  Patient verbalized understanding by repeating back information and was advised to contact me if further medication-related questions arise. Patient was also provided an information handout.  Follow-up Return in about 3 months (around 06/12/2017)., 1 month for warfarin  [name]

## 2017-03-12 NOTE — Assessment & Plan Note (Signed)
HPI: Reports right knee has been hurting more, this has caused her to use more of the Norco to help her tolerate moving around, still some days it is even worse.  Reports <15 mins of morning stiffness, worse at end of day.  A: OA of right knee  P: Discussed she has put back on weight, needs to recommit to weight loss.  Can continue Norco as needed for knee as well as shoulder pain, want to minimize, she has had great benefit from steroid injections in the past wants one today. I discussed at length with nurse Lela present her INR is 3.4 today which is in her theraputic range but does put her at a increased chance of hemarthorsis with joint injection, she could return tomorrow or early next week and would be safer, however she really wants it done today.  She has tolerated other injections very well with minimal to no bleeding.  There we discussed going forward but to use 27 gauge needle to minimize chance of complications.  PROCEDURE NOTE  PROCEDURE: right knee joint steroid injection.  PREOPERATIVE DIAGNOSIS: Osteoarthritis of the right knee.  POSTOPERATIVE DIAGNOSIS: Osteoarthritis of the right knee.  PROCEDURE: The patient was apprised of the risks and the benefits of the procedure and informed consent was obtained, as witnessed by Lela. Time-out procedure was performed, with confirmation of the patient's name, date of birth, and correct identification of the right knee to be injected. The patient's knee was then marked at the appropriate site for injection placement. The knee was sterilely prepped with Betadine. A 40 mg (1 milliliter) solution of Kenalog was drawn up into a 5 mL syringe with a 82mL of 1% lidocaine. The patient was injected with a 27-gauge needle at the anterior medial aspect of her right flexed knee. There were no complications. The patient tolerated the procedure well. There was no bleeding. The patient was instructed to ice her knee upon leaving clinic and refrain from overuse  over the next 3 days. The patient was instructed to go to the emergency room with any usual pain, swelling, or redness occurred in the injected area. The patient was given a followup appointment to evaluate response to the injection to his increased range of motion and reduction of pain.

## 2017-03-12 NOTE — Assessment & Plan Note (Signed)
HPI: She continues to use Hyrocodone 10-325 for chronic knee and shoulder pain.  Has done well with #95 per month allowing her to take 3 pill most days with a few extra pills for bad days where she needs 4.  Otherwise she has had no red flag behavior.    A: Chronic pain treated with opiate analgesic  P:Refilled 3 months of Norco 10-325 #95.  Discussed using fewer than prescribed especially as steroid injection helps to decrease use.

## 2017-03-26 DIAGNOSIS — Z7901 Long term (current) use of anticoagulants: Secondary | ICD-10-CM | POA: Diagnosis not present

## 2017-03-26 DIAGNOSIS — R0602 Shortness of breath: Secondary | ICD-10-CM | POA: Diagnosis not present

## 2017-03-26 DIAGNOSIS — R109 Unspecified abdominal pain: Secondary | ICD-10-CM | POA: Diagnosis not present

## 2017-03-26 DIAGNOSIS — Z86718 Personal history of other venous thrombosis and embolism: Secondary | ICD-10-CM | POA: Diagnosis not present

## 2017-03-26 DIAGNOSIS — R0781 Pleurodynia: Secondary | ICD-10-CM | POA: Diagnosis not present

## 2017-03-26 DIAGNOSIS — Z7982 Long term (current) use of aspirin: Secondary | ICD-10-CM | POA: Diagnosis not present

## 2017-03-26 DIAGNOSIS — Z79899 Other long term (current) drug therapy: Secondary | ICD-10-CM | POA: Diagnosis not present

## 2017-03-30 ENCOUNTER — Ambulatory Visit (INDEPENDENT_AMBULATORY_CARE_PROVIDER_SITE_OTHER): Payer: Medicare Other | Admitting: Pharmacist

## 2017-03-30 DIAGNOSIS — I82533 Chronic embolism and thrombosis of popliteal vein, bilateral: Secondary | ICD-10-CM

## 2017-03-30 DIAGNOSIS — Z7901 Long term (current) use of anticoagulants: Secondary | ICD-10-CM | POA: Diagnosis not present

## 2017-03-30 DIAGNOSIS — I2782 Chronic pulmonary embolism: Secondary | ICD-10-CM

## 2017-03-30 LAB — POCT INR: INR: 2.4

## 2017-03-30 NOTE — Progress Notes (Signed)
INTERNAL MEDICINE TEACHING ATTENDING ADDENDUM - Monnica Saltsman M.D  Duration- indefinite, Indication- PE, DVT, INR- sub therapeutic. Agree with pharmacy recommendations as outlined in their note.      

## 2017-03-30 NOTE — Patient Instructions (Signed)
Patient instructed to take medications as defined in the Anti-coagulation Track section of this encounter.  Patient instructed to take today's dose. Take one (1) tablet by mouth once-daily all days except Monday and Thursday take 0.5 tablets of your 5mg  peach colored tablets at [facility].  Patient verbalized understanding of these instructions.

## 2017-03-30 NOTE — Progress Notes (Signed)
Anticoagulation Management Breanna Wilkins is a 56 y.o. female who reports to the clinic for monitoring of warfarin treatment.    Indication: DVT  Duration: indefinite Supervising physician: Aldine Contes  Anticoagulation Clinic Visit History: Patient does not report signs/symptoms of bleeding or thromboembolism  Other recent changes: Patient reports starting an antibiotic, but cannot remember the name. Tomorrow is the last day of therapy. No other diet, medications, lifestyle changes reported. Anticoagulation Episode Summary    Current INR goal:   2.5-3.5  TTR:   55.0 % (6 y)  Next INR check:   05/11/2017  INR from last check:   2.4! (03/30/2017)  Weekly max warfarin dose:     Target end date:     INR check location[facility]   [facility]  Preferred lab:     Send INR reminders to:      Indications   Pulmonary embolus (HCC) (Resolved) [I26.99] Chronic deep vein thrombosis (DVT) of both popliteal veins (HCC) [I82.533] Chronic pulmonary embolism (HCC) [I27.82] Long term (current) use of anticoagulants [Z79.01]       Comments:           No Known Allergies Prior to Admission medications   Medication Sig Start Date End Date Taking? Authorizing Provider  aspirin 81 MG chewable tablet Chew 1 tablet (81 mg total) by mouth daily. 10/08/12  Yes Hosie Poisson, MD  [place] 5-2.5-18.5 injection  05/29/14  Yes [provider]  carvedilol (COREG) 6.25 MG tablet Take 1 tablet (6.25 mg total) by mouth 2 (two) times daily with a meal. 11/12/16  Yes Lucious Groves, DO  diclofenac sodium (VOLTAREN) 1 % GEL Apply 2 g topically 4 (four) times daily. 09/11/16  Yes Lucious Groves, DO  furosemide (LASIX) 80 MG tablet Take 1 tablet (80 mg total) by mouth daily. 11/12/16  Yes Lucious Groves, DO  HYDROcodone-acetaminophen (NORCO) 10-325 MG tablet Take 1 tablet by mouth every 6 (six) hours as needed. 03/12/17  Yes Lucious Groves, DO  lisinopril (PRINIVIL,ZESTRIL) 10 MG tablet Take 1 tablet (10 mg  total) by mouth daily. 04/28/16 04/28/17 Yes Lucious Groves, DO  pantoprazole (PROTONIX) 40 MG tablet TAKE 1 TABLET BY MOUTH 30 MINUTES BEFORE LUNCH OR DINNER 05/09/16  Yes Joni Reining C, DO  rosuvastatin (CRESTOR) 5 MG tablet Take 1 tablet (5 mg total) by mouth at bedtime. 06/06/16  Yes Sid Falcon, MD  triamcinolone (KENALOG) 0.025 % ointment Apply twice daily for 2 weeks then stop 11/20/15  Yes Loleta Chance, MD  warfarin (COUMADIN) 5 MG tablet Take one tablet by mouth once daily except on Mondays & Thursdays- take one-half tablet on Mondays & Thursdays 02/04/17  Yes Bartholomew Crews, MD   Past Medical History:  Diagnosis Date  . Abdominal hernia   . CAD (coronary artery disease)    Perioperative non-STEMI March, 2014  //   cardiac catheterization at time of non-STEMI, March, 2014, minor nonobstructive coronary disease, vigorous LV function, possibly vasospastic event versus stress cardiomyopathy. No further workup of coronary disease  . CHF (congestive heart failure) ([place])   . Ejection fraction    65-70%, vigorous function, echo, March, 2011  . Ejection fraction    Vigorous function at time of catheterization October 06, 2012,  . Hypertension   . Lung nodule 12/2009   Very small, left upper lobe by CT June, 2011, one-year followup was stable  . OSA (obstructive sleep apnea)    CPAP  . Overweight(278.02)   . Pulmonary  embolus ([place]) 01/16/10   on Coumadin indefinitely  . Shortness of breath   . Shoulder pain   . Warfarin anticoagulation    Social History   Social History  . Marital status: Single    Spouse name: N/A  . Number of children: N/A  . Years of education: N/A   Social History Main Topics  . Smoking status: Former Research scientist (life sciences)  . Smokeless tobacco: Never Used     Comment: quit 12 years ago  . Alcohol use 3.6 oz/week    6 Cans of beer per week     Comment: Beer sometimes.  . Drug use: No     Comment: smoked joint 2 weeks ago  . Sexual activity: Not on file   Other  Topics Concern  . Not on file   Social History Narrative   Financial assistance approved for 100% discount at [facility] and has [facility] card per Bonna Gains   12/25/2009   Family History  Problem Relation Age of Onset  . Diabetes Father   . Diabetes Sister   . Diabetes Mother   . Ovarian cancer Mother   . Deep vein thrombosis Sister   . Bone cancer Maternal Grandmother     ASSESSMENT Recent Results: The most recent result is correlated with 30 mg per week: Lab Results  Component Value Date   INR 2.4 03/30/2017   INR 3.4 03/12/2017   INR 2.70 02/09/2017    Anticoagulation Dosing: INR as of 03/30/2017 and Previous Warfarin Dosing Information    INR Dt INR Goal [name]   03/30/2017 2.4 2.5-3.5 30 mg 5 mg 2.5 mg 5 mg 5 mg 2.5 mg 5 mg 5 mg    Previous description   Take one (1) tablet by mouth once-daily all days except Monday and Thursday take 0.5 tablets of your 5mg  peach colored tablets at 6PM.    Anticoagulation Warfarin Dose Instructions as of 03/30/2017      Total [name]   New Dose 30 mg 5 mg 2.5 mg 5 mg 5 mg 2.5 mg 5 mg 5 mg     (5 mg x 1)  (5 mg x 0.5)  (5 mg x 1)  (5 mg x 1)  (5 mg x 0.5)  (5 mg x 1)  (5 mg x 1)                         Description   Take one (1) tablet by mouth once-daily all days except Monday and Thursday take 0.5 tablets of your 5mg  peach colored tablets at 6PM.      INR today: Subtherapeutic  PLAN Weekly dose was unchanged  Patient Instructions  Patient instructed to take medications as defined in the Anti-coagulation Track section of this encounter.  Patient instructed to take today's dose. Take one (1) tablet by mouth once-daily all days except Monday and Thursday take 0.5 tablets of your 5mg  peach colored tablets at [facility].  Patient verbalized understanding of these instructions.     Patient advised to contact clinic or seek medical attention if signs/symptoms of bleeding or thromboembolism  occur.  Patient verbalized understanding by repeating back information and was advised to contact me if further medication-related questions arise. Patient was also provided an information handout.  Follow-up Return for Follow up INR 1030.  Mila Merry Gerarda Fraction, PharmD PGY1 Pharmacy Resident Pager: [phone]  15 minutes spent face-to-face  with the patient during the encounter. 50% of time spent on education. 50% of time was spent on point of care INR fingerstick test, processing, results interpretation, and documentation in [facility] and [email or web address]

## 2017-04-16 ENCOUNTER — Other Ambulatory Visit: Payer: Self-pay | Admitting: Internal Medicine

## 2017-04-16 DIAGNOSIS — Z1231 Encounter for screening mammogram for malignant neoplasm of breast: Secondary | ICD-10-CM

## 2017-04-21 ENCOUNTER — Other Ambulatory Visit: Payer: Self-pay | Admitting: Internal Medicine

## 2017-04-21 MED ORDER — PANTOPRAZOLE SODIUM 40 MG PO TBEC: DELAYED_RELEASE_TABLET | ORAL | 3 refills | Status: DC

## 2017-04-21 MED ORDER — LISINOPRIL 10 MG PO TABS: 10.0000 mg | ORAL_TABLET | Freq: Every day | ORAL | 3 refills | Status: DC

## 2017-04-21 NOTE — Telephone Encounter (Signed)
Patient requesting call back for refills

## 2017-04-21 NOTE — Telephone Encounter (Signed)
Requesting refill on Protonix and Lisinopril.

## 2017-05-04 ENCOUNTER — Other Ambulatory Visit: Payer: Self-pay

## 2017-05-04 MED ORDER — CARVEDILOL 6.25 MG PO TABS: 6.2500 mg | ORAL_TABLET | Freq: Two times a day (BID) | ORAL | 1 refills | Status: DC

## 2017-05-04 NOTE — Telephone Encounter (Signed)
carvedilol (COREG) 6.25 MG tablet   Refill request @ [facility] on [address].

## 2017-05-11 ENCOUNTER — Encounter (INDEPENDENT_AMBULATORY_CARE_PROVIDER_SITE_OTHER): Payer: Self-pay

## 2017-05-11 ENCOUNTER — Ambulatory Visit (INDEPENDENT_AMBULATORY_CARE_PROVIDER_SITE_OTHER): Payer: Medicare Other | Admitting: Pharmacist

## 2017-05-11 ENCOUNTER — Ambulatory Visit
Admission: RE | Admit: 2017-05-11 | Discharge: 2017-05-11 | Disposition: A | Discharge status: Home / Self Care | Payer: Medicare Other | Source: Ambulatory Visit | Attending: Internal Medicine | Admitting: Internal Medicine

## 2017-05-11 DIAGNOSIS — Z7901 Long term (current) use of anticoagulants: Secondary | ICD-10-CM | POA: Diagnosis not present

## 2017-05-11 DIAGNOSIS — I2782 Chronic pulmonary embolism: Secondary | ICD-10-CM

## 2017-05-11 DIAGNOSIS — Z1231 Encounter for screening mammogram for malignant neoplasm of breast: Secondary | ICD-10-CM

## 2017-05-11 DIAGNOSIS — I82533 Chronic embolism and thrombosis of popliteal vein, bilateral: Secondary | ICD-10-CM | POA: Diagnosis not present

## 2017-05-11 LAB — POCT INR: INR: 3.2

## 2017-05-11 NOTE — Progress Notes (Signed)
Anticoagulation Management Breanna Wilkins is a 56 y.o. female who reports to the clinic for monitoring of warfarin treatment.    Indication: PE (Resolved) ([place]) [126.99), Chronic deep vein thrombosis (DVT) of both popliteal veins (HCC) [182.533], Chronic pulmonary embolism ([place]); long term (current) use of oral anticoagulants.  Duration: indefinite Supervising physician: Oval Linsey  Anticoagulation Clinic Visit History: Patient does not report signs/symptoms of bleeding or thromboembolism  Other recent changes: No diet, medications, lifestyle changes endorsed by the patient to me.  Anticoagulation Episode Summary    Current INR goal:   2.5-3.5  TTR:   55.6 % (6.1 y)  Next INR check:   06/08/2017  INR from last check:   3.20 (05/11/2017)  Weekly max warfarin dose:     Target end date:     INR check location:   [facility]  Preferred lab:     Send INR reminders to:      Indications   Pulmonary embolus (HCC) (Resolved) [I26.99] Chronic deep vein thrombosis (DVT) of both popliteal veins (HCC) [I82.533] Chronic pulmonary embolism (HCC) [I27.82] Long term (current) use of anticoagulants [Z79.01]       Comments:           No Known Allergies Prior to Admission medications   Medication Sig Start Date End Date Taking? Authorizing Provider  aspirin 81 MG chewable tablet Chew 1 tablet (81 mg total) by mouth daily. 10/08/12  Yes Hosie Poisson, MD  carvedilol (COREG) 6.25 MG tablet Take 1 tablet (6.25 mg total) by mouth 2 (two) times daily with a meal. 05/04/17  Yes Lucious Groves, DO  diclofenac sodium (VOLTAREN) 1 % GEL Apply 2 g topically 4 (four) times daily. 09/11/16  Yes Lucious Groves, DO  furosemide (LASIX) 80 MG tablet Take 1 tablet (80 mg total) by mouth daily. 11/12/16  Yes Lucious Groves, DO  HYDROcodone-acetaminophen (NORCO) 10-325 MG tablet Take 1 tablet by mouth every 6 (six) hours as needed. 03/12/17  Yes Lucious Groves, DO  lisinopril (PRINIVIL,ZESTRIL) 10 MG  tablet Take 1 tablet (10 mg total) by mouth daily. 04/21/17 04/21/18 Yes Lucious Groves, DO  pantoprazole (PROTONIX) 40 MG tablet TAKE 1 TABLET BY MOUTH 30 MINUTES BEFORE LUNCH OR DINNER 04/21/17  Yes Joni Reining C, DO  rosuvastatin (CRESTOR) 5 MG tablet Take 1 tablet (5 mg total) by mouth at bedtime. 06/06/16  Yes Sid Falcon, MD  warfarin (COUMADIN) 5 MG tablet Take one tablet by mouth once daily except on Mondays & Thursdays- take one-half tablet on Mondays & Thursdays 02/04/17  Yes Bartholomew Crews, MD  [place] 5-2.5-18.5 injection  05/29/14   [provider]  triamcinolone (KENALOG) 0.025 % ointment Apply twice daily for 2 weeks then stop Patient not taking: Reported on 05/11/2017 11/20/15   Loleta Chance, MD   Past Medical History:  Diagnosis Date  . Abdominal hernia   . CAD (coronary artery disease)    Perioperative non-STEMI March, 2014  //   cardiac catheterization at time of non-STEMI, March, 2014, minor nonobstructive coronary disease, vigorous LV function, possibly vasospastic event versus stress cardiomyopathy. No further workup of coronary disease  . CHF (congestive heart failure) ([place])   . Ejection fraction    65-70%, vigorous function, echo, March, 2011  . Ejection fraction    Vigorous function at time of catheterization October 06, 2012,  . Hypertension   . Lung nodule 12/2009   Very small, left upper lobe by CT June, 2011, one-year followup  was stable  . OSA (obstructive sleep apnea)    CPAP  . Overweight(278.02)   . Pulmonary embolus ([place]) 01/16/10   on Coumadin indefinitely  . Shortness of breath   . Shoulder pain   . Warfarin anticoagulation    Social History   Social History  . Marital status: Single    Spouse name: N/A  . Number of children: N/A  . Years of education: N/A   Social History Main Topics  . Smoking status: Former Research scientist (life sciences)  . Smokeless tobacco: Never Used     Comment: quit 12 years ago  . Alcohol use 3.6 oz/week    6 Cans of beer  per week     Comment: Beer sometimes.  . Drug use: No     Comment: smoked joint 2 weeks ago  . Sexual activity: Not on file   Other Topics Concern  . Not on file   Social History Narrative   Financial assistance approved for 100% discount at [facility] and has [facility] card per Bonna Gains   12/25/2009   Family History  Problem Relation Age of Onset  . Diabetes Father   . Diabetes Sister   . Diabetes Mother   . Ovarian cancer Mother   . Deep vein thrombosis Sister   . Bone cancer Maternal Grandmother     ASSESSMENT Recent Results: The most recent result is correlated with 30 mg per week: Lab Results  Component Value Date   INR 3.20 05/11/2017   INR 2.4 03/30/2017   INR 3.4 03/12/2017    Anticoagulation Dosing: INR as of 05/11/2017 and Previous Warfarin Dosing Information    INR Dt INR Goal [name]   05/11/2017 3.20 2.5-3.5 30 mg 5 mg 2.5 mg 5 mg 5 mg 2.5 mg 5 mg 5 mg    Previous description   Take one (1) tablet by mouth once-daily all days except Monday and Thursday take 0.5 tablets of your 5mg  peach colored tablets at 6PM.    Anticoagulation Warfarin Dose Instructions as of 05/11/2017      Total [name]   New Dose 30 mg 5 mg 2.5 mg 5 mg 5 mg 2.5 mg 5 mg 5 mg     (5 mg x 1)  (5 mg x 0.5)  (5 mg x 1)  (5 mg x 1)  (5 mg x 0.5)  (5 mg x 1)  (5 mg x 1)                         Description   Take one (1) tablet by mouth once-daily all days except Monday and Thursday take 0.5 tablets of your 5mg  peach colored tablets at 6PM.      INR today: Therapeutic  PLAN Weekly dose was unchanged.  Patient Instructions  Patient instructed to take medications as defined in the Anti-coagulation Track section of this encounter.  Patient instructed to take today's dose.  Patient instructed to take one (1) tablet by mouth once-daily all days except Monday and Thursday take 0.5 tablets of your 5mg  peach colored tablets at [facility].  Patient  verbalized understanding of these instructions.     Patient advised to contact clinic or seek medical attention if signs/symptoms of bleeding or thromboembolism occur.  Patient verbalized understanding by repeating back information and was advised to contact me if further medication-related questions arise. Patient was also provided an information handout.  Follow-up Return in about 4 weeks (around 06/08/2017) for Follow up INR at 1030h.  Pennie Banter, PharmD, CACP, CPP  15 minutes spent face-to-face with the patient during the encounter. 50% of time spent on education. 50% of time was spent on fingerstick point of care INR sample collection, processing, results determination and documentation in [email or web address].

## 2017-05-11 NOTE — Patient Instructions (Signed)
Patient instructed to take medications as defined in the Anti-coagulation Track section of this encounter.  Patient instructed to take today's dose.  Patient instructed to take one (1) tablet by mouth once-daily all days except Monday and Thursday take 0.5 tablets of your 5mg  peach colored tablets at [facility].  Patient verbalized understanding of these instructions.

## 2017-05-13 ENCOUNTER — Encounter: Payer: Self-pay | Admitting: Interventional Cardiology

## 2017-05-13 ENCOUNTER — Ambulatory Visit (INDEPENDENT_AMBULATORY_CARE_PROVIDER_SITE_OTHER): Payer: Medicare Other | Admitting: Interventional Cardiology

## 2017-05-13 VITALS — BP 138/92 | HR 65 | Ht 67.0 in | Wt 311.4 lb

## 2017-05-13 DIAGNOSIS — I10 Essential (primary) hypertension: Secondary | ICD-10-CM | POA: Diagnosis not present

## 2017-05-13 DIAGNOSIS — E7849 Other hyperlipidemia: Secondary | ICD-10-CM

## 2017-05-13 DIAGNOSIS — R0609 Other forms of dyspnea: Secondary | ICD-10-CM

## 2017-05-13 NOTE — Progress Notes (Signed)
Cardiology Office Note    Date:  05/13/2017   ID:  Breanna Wilkins, DOB [birth year], MRN [id number]  PCP:  Lucious Groves, DO  Cardiologist: Sinclair Grooms, MD   Chief Complaint  Patient presents with  . Congestive Heart Failure    History of Present Illness:  Breanna Wilkins is a 56 y.o. female for follow-up who has baseline medical problems that include morbid obesity, obstructive sleep apnea, history of recurrent pulmonary emboli requiring chronic Coumadin therapy, chronic diastolic heart failure, and known nonobstructive coronary disease by cath in 2014 at the time of non-ST elevation.  She is asymptomatic with reference to angina. She has exertional dyspnea.. She denies orthopnea and PND. She does develop lower extremity swelling if diuretic therapy is not used. No palpitations, syncope, or transient neurological symptoms have occurred.    Past Medical History:  Diagnosis Date  . Abdominal hernia   . CAD (coronary artery disease)    Perioperative non-STEMI March, 2014  //   cardiac catheterization at time of non-STEMI, March, 2014, minor nonobstructive coronary disease, vigorous LV function, possibly vasospastic event versus stress cardiomyopathy. No further workup of coronary disease  . CHF (congestive heart failure) ([place])   . Ejection fraction    65-70%, vigorous function, echo, March, 2011  . Ejection fraction    Vigorous function at time of catheterization October 06, 2012,  . Hypertension   . Lung nodule 12/2009   Very small, left upper lobe by CT June, 2011, one-year followup was stable  . OSA (obstructive sleep apnea)    CPAP  . Overweight(278.02)   . Pulmonary embolus ([place]) 01/16/10   on Coumadin indefinitely  . Shortness of breath   . Shoulder pain   . Warfarin anticoagulation     Past Surgical History:  Procedure Laterality Date  . ABDOMINAL HYSTERECTOMY  07/2002   laparoscopic assisted vaginal hysterectomy for menorrhagia, dysmenorrhea, anemia,  fibroids  . INCISIONAL HERNIA REPAIR N/A 09/27/2012   Procedure: LAPAROSCOPIC INCISIONAL HERNIA possible open;  Surgeon: Adin Hector, MD;  Location: [facility];  Service: General;  Laterality: N/A;  . INSERTION OF MESH N/A 09/27/2012   Procedure: INSERTION OF MESH;  Surgeon: Adin Hector, MD;  Location: [facility];  Service: General;  Laterality: N/A;  . KNEE ARTHROSCOPY Left   . LEFT HEART CATHETERIZATION WITH CORONARY ANGIOGRAM N/A 10/06/2012   Procedure: LEFT HEART CATHETERIZATION WITH CORONARY ANGIOGRAM;  Surgeon: Sherren Mocha, MD;  Location[facility];  Service: Cardiovascular;  Laterality: N/A;    Current Medications: Outpatient Medications Prior to Visit  Medication Sig Dispense Refill  . aspirin 81 MG chewable tablet Chew 1 tablet (81 mg total) by mouth daily. 30 tablet 1  . carvedilol (COREG) 6.25 MG tablet Take 1 tablet (6.25 mg total) by mouth 2 (two) times daily with a meal. 180 tablet 1  . furosemide (LASIX) 80 MG tablet Take 1 tablet (80 mg total) by mouth daily. 90 tablet 2  . HYDROcodone-acetaminophen (NORCO) 10-325 MG tablet Take 1 tablet by mouth every 6 (six) hours as needed. 95 tablet 0  . lisinopril (PRINIVIL,ZESTRIL) 10 MG tablet Take 1 tablet (10 mg total) by mouth daily. 90 tablet 3  . pantoprazole (PROTONIX) 40 MG tablet TAKE 1 TABLET BY MOUTH 30 MINUTES BEFORE LUNCH OR DINNER 90 tablet 3  . rosuvastatin (CRESTOR) 5 MG tablet Take 1 tablet (5 mg total) by mouth at bedtime. 90 tablet 3  . warfarin (COUMADIN) 5 MG tablet Take  one tablet by mouth once daily except on Mondays & Thursdays- take one-half tablet on Mondays & Thursdays 78 tablet 2  . BOOSTRIX 5-2.5-18.5 injection     . diclofenac sodium (VOLTAREN) 1 % GEL Apply 2 g topically 4 (four) times daily. (Patient not taking: Reported on 05/13/2017) 1 Tube 5  . triamcinolone (KENALOG) 0.025 % ointment Apply twice daily for 2 weeks then stop (Patient not taking: Reported on 05/11/2017) 15 g 0   No  facility-administered medications prior to visit.      Allergies:   Patient has no known allergies.   Social History   Social History  . Marital status: Single    Spouse name: N/A  . Number of children: N/A  . Years of education: N/A   Social History Main Topics  . Smoking status: Former Research scientist (life sciences)  . Smokeless tobacco: Never Used     Comment: quit 12 years ago  . Alcohol use 3.6 oz/week    6 Cans of beer per week     Comment: Beer sometimes.  . Drug use: No     Comment: smoked joint 2 weeks ago  . Sexual activity: Not Asked   Other Topics Concern  . None   Social History Narrative   Financial assistance approved for 100% discount at [facility] and has [facility] card per [employer]   12/25/2009     Family History:  The patient's family history includes Bone cancer in her maternal grandmother; Deep vein thrombosis in her sister; Diabetes in her father, mother, and sister; Ovarian cancer in her mother.   ROS:   Please see the history of present illness.    Feels tired frequently. Compliant with medical therapy. Does not smoke. Drinks alcohol on a daily basis, beer one or two 12 ounce cans . Occasional ankle edema. All other systems reviewed and are negative.   PHYSICAL EXAM:   VS:  BP (!) 138/92 (BP Location: Right Arm)   Pulse 65   Ht 5\' 7"  (1.702 m)   Wt (!) 311 lb 6.4 oz (141.3 kg)   BMI 48.77 kg/m    GEN: Well nourished, well developed, in no acute distress  HEENT: normal  Neck: no JVD, carotid bruits, or masses Cardiac: RRR; no murmurs, rubs, or gallops,no edema  Respiratory:  clear to auscultation bilaterally, normal work of breathing GI: soft, nontender, nondistended, + BS MS: no deformity or atrophy  Skin: warm and dry, no rash Neuro:  Alert and Oriented x 3, Strength and sensation are intact Psych: euthymic mood, full affect  Wt Readings from Last 3 Encounters:  05/13/17 (!) 311 lb 6.4 oz (141.3 kg)  03/12/17 (!) 307 lb 12.8 oz (139.6 kg)  12/18/16 (!) 304 lb  (137.9 kg)      Studies/Labs Reviewed:   EKG:  EKG  Sinus rhythm, poor R wave progression V1 through V4, and otherwise normal.  Recent Labs: No results found for requested labs within last 8760 hours.   Lipid Panel    Component Value Date/Time   CHOL 159 09/21/2015 1625   TRIG 127 09/21/2015 1625   HDL 48 09/21/2015 1625   CHOLHDL 3.3 09/21/2015 1625   CHOLHDL 3.9 10/17/2013 1016   VLDL 20 10/17/2013 1016   LDLCALC 86 09/21/2015 1625    Additional studies/ records that were reviewed today include:  Echocardiogram performed November 2017: Study Conclusions   - Left ventricle: The cavity size was normal. Wall thickness was   increased in a pattern of mild LVH.  Systolic function was normal.   The estimated ejection fraction was in the range of 60% to 65%.   Wall motion was normal; there were no regional wall motion   abnormalities. Doppler parameters are consistent with abnormal   left ventricular relaxation (grade 1 diastolic dysfunction). - Pulmonary arteries: Systolic pressure was mildly increased. PA   peak pressure: 32 mm Hg (S). - Pericardium, extracardiac: A small pericardial effusion was   identified.    ASSESSMENT:    1. Essential hypertension, benign   2. Dyspnea on exertion   3. Other hyperlipidemia   4. Morbid obesity ([place])      PLAN:  In order of problems listed above:  1. Adequate blood pressure control. No adjustment is needed in the current medical regimen. Low salt diet discussed. 2. Dyspnea on exertion is likely multifactorial. There is no gross volume overload. Obesity has a significant influence as does physical deconditioning. 3. Low fat diet 4. Long discussion concerning caloric intake and the need to significantly decrease. We spoke about limiting/excluding soft drinks and beer. Cut back significantly on carbohydrates. Employ more vegetables/plan based ingredients. Increase physical activity. Shifted her concerns about health to obesity as  her major risk factor for poor outcome in the future.  One-year follow-up    Medication Adjustments/Labs and Tests Ordered: Current medicines are reviewed at length with the patient today.  Concerns regarding medicines are outlined above.  Medication changes, Labs and Tests ordered today are listed in the Patient Instructions below. Patient Instructions  Medication Instructions:  Your physician recommends that you continue on your current medications as directed. Please refer to the Current Medication list given to you today.   Lab: None   Testing/Procedures: None   Follow-Up: Your physician wants you to follow-up in 1 year with Dr. Tamala Julian. You will receive a reminder letter in the mail two months in advance. If you don't receive a letter, please call our office to schedule the follow-up appointment.   Any Other Special Instructions Will Be Listed Below (If Applicable).   Please be sure to follow a low carbohydrate diet.    If you need a refill on your cardiac medications before your next appointment, please call your pharmacy.      Signed, Sinclair Grooms, MD  05/13/2017 9:38 AM    [facility] [address], [place], [place]  [address] Phone: [phone]; Fax: [phone]

## 2017-05-13 NOTE — Patient Instructions (Addendum)
Medication Instructions:  Your physician recommends that you continue on your current medications as directed. Please refer to the Current Medication list given to you today.   Lab: None   Testing/Procedures: None   Follow-Up: Your physician wants you to follow-up in 1 year with Dr. Tamala Julian. You will receive a reminder letter in the mail two months in advance. If you don't receive a letter, please call our office to schedule the follow-up appointment.   Any Other Special Instructions Will Be Listed Below (If Applicable).   Please be sure to follow a low carbohydrate diet.    If you need a refill on your cardiac medications before your next appointment, please call your pharmacy.

## 2017-05-25 NOTE — Progress Notes (Signed)
Indication: Recurrent venous thromboembolism. Duration: Indefinite per patient preference. INR: At target. Agree with Dr. Gladstone Pih assessment and plan.

## 2017-05-28 ENCOUNTER — Other Ambulatory Visit: Payer: Self-pay | Admitting: Internal Medicine

## 2017-06-08 ENCOUNTER — Ambulatory Visit: Payer: Medicare Other

## 2017-06-23 ENCOUNTER — Other Ambulatory Visit: Payer: Self-pay | Admitting: Internal Medicine

## 2017-06-23 NOTE — Telephone Encounter (Signed)
Patient is requesting refills on pain medicine  °

## 2017-06-25 MED ORDER — HYDROCODONE-ACETAMINOPHEN 10-325 MG PO TABS: 1.0000 | ORAL_TABLET | Freq: Four times a day (QID) | ORAL | 0 refills | Status: DC | PRN

## 2017-06-25 NOTE — Telephone Encounter (Addendum)
Last office visit: 03/12/2017 Last UDS: 05/01/2016 Last Refill: 05/30/2017 #95 (confirmed with pharmacy) Next appt: 07/09/2017

## 2017-06-25 NOTE — Telephone Encounter (Signed)
Would like to know if   HYDROcodone-acetaminophen (NORCO) 10-325 MG tablet   is ready for pick up. Please call pt back.  

## 2017-07-08 NOTE — Progress Notes (Signed)
Subjective:  HPI: [name] is a 56 y.o. female who presents for follow up of HTN, chronic pain  Please see Assessment and Plan below for the status of her chronic medical problems.  Review of Systems: Review of Systems  Respiratory: Positive for shortness of breath. Negative for cough.   Cardiovascular: Negative for chest pain and orthopnea.  Gastrointestinal: Negative for constipation.  Musculoskeletal: Positive for joint pain.  Psychiatric/Behavioral: Negative for substance abuse.    Objective:  Physical Exam: Vitals:   07/09/17 1038 07/09/17 1126  BP: (!) 154/74 (!) 147/64  Pulse: 77 68  Temp: 97.8 F (36.6 C)   TempSrc: Oral   SpO2: 99%   Weight: (!) 314 lb (142.4 kg)   Height: 5\' 7"  (1.702 m)    Physical Exam  Constitutional: No distress.  obese  Cardiovascular: Normal rate and regular rhythm.  Pulmonary/Chest: Effort normal and breath sounds normal. She has no wheezes. She has no rales.  Abdominal: Soft. Bowel sounds are normal.  Musculoskeletal: She exhibits edema (1+ bilateral).       Right shoulder: She exhibits normal range of motion.       Left shoulder: She exhibits decreased range of motion. She exhibits no tenderness.       Right knee: She exhibits normal range of motion, no swelling and no effusion. Tenderness found. Medial joint line and lateral joint line tenderness noted.       Left knee: She exhibits normal range of motion and no swelling. Tenderness found. Medial joint line tenderness noted. No lateral joint line tenderness noted.  Nursing note and vitals reviewed.  Assessment & Plan:  Primary osteoarthritis of right knee HPI: Continues to c/o chronic pain, predominately both knees but right> left, last steroid injection has helped a lot.  She also notes her chronic left shoulder pain and some right heel pain (plantar fascitis). As well as chronic low back pain. Notes with the cold weather pains have been worse.  Most days she takes 3 pills  of hydrocodone-apap.  Infrequently she takes 2 pills and she does reqiure 4 pills some days.  Overall feels she gets a lot of benefit from the Hydrocodone medications.  A: Chronic pain due to Primary OA of multiple joints due to obesity, as well as left rotator cuff tendonopathy  P: Discussed again AE of opioid medications and limiting use. We are avoiding NSAIDS due to increased bleeding risk.  Does not have much of a neuropathic component but we may need to reconsider duloxetine and lyrica in the future.  She continues to want to avoid any surgical options due to previous perioperative CVA Today will add lidocaine 5% onitment Will have limited increase in Norco-APAP 10-325 to #100 pills a month (5 pill increase) -3 month rx provided.  Essential hypertension, benign HPI: Taking lisinopril, no complaints.  A: Essential HTN, above goal  P: rechecked and remained slightly elevated. Discussed that this is likely related to her lack of use of CPAP as well as weight gain but would recommend increase to 20mg  lisinopril.  However she assures she will work on wegiht loss and pay bill for CPAP mask and start using again, in that case we will continue lisinopril 10mg  daily.  OBSTRUCTIVE SLEEP APNEA HPI: Appears more fatigued today, she reports not using CPAP due to lack of supplies, apparently has a bill outstanding.  A: OSA  P: She reports she will resolve her CPAP issues and resume use.  BMI 45.0-49.9, adult ([place]) Again stressed  weight loss.  Vitamin D deficiency Takin 2000IU daily of vit d3, leg cramps have resolved  Repeat Vit d level today is still 25, will have her take 2 pills on MWF and 1 on other days   Medications Ordered Meds ordered this encounter  Medications  . lidocaine (XYLOCAINE) 5 % ointment    Sig: Apply 1 application topically 2 (two) times daily as needed.    Dispense:  150 g    Refill:  1  . DISCONTD: HYDROcodone-acetaminophen (NORCO) 10-325 MG tablet    Sig: Take  1 tablet by mouth every 6 (six) hours as needed.    Dispense:  100 tablet    Refill:  0    Rx 1/3 May be filled on or after 07/25/17  . DISCONTD: HYDROcodone-acetaminophen (NORCO) 10-325 MG tablet    Sig: Take 1 tablet by mouth every 6 (six) hours as needed.    Dispense:  100 tablet    Refill:  0    Rx 2/3 May be filled on or after 08/24/17  . HYDROcodone-acetaminophen (NORCO) 10-325 MG tablet    Sig: Take 1 tablet by mouth every 6 (six) hours as needed.    Dispense:  100 tablet    Refill:  0    Rx 3/3 May be filled on or after 09/23/17   Other Orders Orders Placed This Encounter  Procedures  . CMP14 + Anion Gap  . Vitamin D (25 hydroxy)   Follow Up: Return in about 3 months (around 10/07/2017).

## 2017-07-09 ENCOUNTER — Encounter: Payer: Self-pay | Admitting: Internal Medicine

## 2017-07-09 ENCOUNTER — Ambulatory Visit (INDEPENDENT_AMBULATORY_CARE_PROVIDER_SITE_OTHER): Payer: Medicare Other | Admitting: Pharmacist

## 2017-07-09 ENCOUNTER — Ambulatory Visit (INDEPENDENT_AMBULATORY_CARE_PROVIDER_SITE_OTHER): Payer: Medicare Other | Admitting: Internal Medicine

## 2017-07-09 ENCOUNTER — Encounter (INDEPENDENT_AMBULATORY_CARE_PROVIDER_SITE_OTHER): Payer: Self-pay

## 2017-07-09 ENCOUNTER — Other Ambulatory Visit: Payer: Self-pay

## 2017-07-09 VITALS — BP 147/64 | HR 68 | Temp 97.8°F | Ht 67.0 in | Wt 314.0 lb

## 2017-07-09 DIAGNOSIS — M545 Low back pain: Secondary | ICD-10-CM | POA: Diagnosis not present

## 2017-07-09 DIAGNOSIS — Z79891 Long term (current) use of opiate analgesic: Secondary | ICD-10-CM

## 2017-07-09 DIAGNOSIS — Z86711 Personal history of pulmonary embolism: Secondary | ICD-10-CM | POA: Diagnosis not present

## 2017-07-09 DIAGNOSIS — Z6841 Body Mass Index (BMI) 40.0 and over, adult: Secondary | ICD-10-CM | POA: Diagnosis not present

## 2017-07-09 DIAGNOSIS — I82533 Chronic embolism and thrombosis of popliteal vein, bilateral: Secondary | ICD-10-CM

## 2017-07-09 DIAGNOSIS — Z86718 Personal history of other venous thrombosis and embolism: Secondary | ICD-10-CM | POA: Diagnosis not present

## 2017-07-09 DIAGNOSIS — M722 Plantar fascial fibromatosis: Secondary | ICD-10-CM

## 2017-07-09 DIAGNOSIS — M75102 Unspecified rotator cuff tear or rupture of left shoulder, not specified as traumatic: Secondary | ICD-10-CM

## 2017-07-09 DIAGNOSIS — M1711 Unilateral primary osteoarthritis, right knee: Secondary | ICD-10-CM

## 2017-07-09 DIAGNOSIS — I2782 Chronic pulmonary embolism: Secondary | ICD-10-CM

## 2017-07-09 DIAGNOSIS — Z7901 Long term (current) use of anticoagulants: Secondary | ICD-10-CM | POA: Diagnosis not present

## 2017-07-09 DIAGNOSIS — I10 Essential (primary) hypertension: Secondary | ICD-10-CM

## 2017-07-09 DIAGNOSIS — G4733 Obstructive sleep apnea (adult) (pediatric): Secondary | ICD-10-CM

## 2017-07-09 DIAGNOSIS — G8929 Other chronic pain: Secondary | ICD-10-CM | POA: Diagnosis not present

## 2017-07-09 DIAGNOSIS — E669 Obesity, unspecified: Secondary | ICD-10-CM

## 2017-07-09 DIAGNOSIS — E559 Vitamin D deficiency, unspecified: Secondary | ICD-10-CM

## 2017-07-09 DIAGNOSIS — Z87891 Personal history of nicotine dependence: Secondary | ICD-10-CM

## 2017-07-09 DIAGNOSIS — M25512 Pain in left shoulder: Secondary | ICD-10-CM

## 2017-07-09 DIAGNOSIS — Z79899 Other long term (current) drug therapy: Secondary | ICD-10-CM

## 2017-07-09 LAB — POCT INR: INR: 2.9

## 2017-07-09 MED ORDER — HYDROCODONE-ACETAMINOPHEN 10-325 MG PO TABS: 1.0000 | ORAL_TABLET | Freq: Four times a day (QID) | ORAL | 0 refills | Status: DC | PRN

## 2017-07-09 MED ORDER — LIDOCAINE 5 % EX OINT: 1.0000 "application " | TOPICAL_OINTMENT | Freq: Two times a day (BID) | CUTANEOUS | 1 refills | Status: DC | PRN

## 2017-07-09 NOTE — Patient Instructions (Signed)
Patient educated about medication as defined in this encounter and verbalized understanding by repeating back instructions provided.   

## 2017-07-09 NOTE — Addendum Note (Signed)
Addended by: Jossalyn Forgione J on: 07/09/2017 03:41 PM   Modules accepted: Level of Service  

## 2017-07-09 NOTE — Progress Notes (Signed)
Anticoagulation Management Breanna Wilkins is a 56 y.o. female who reports to the clinic for monitoring of warfarin treatment.    Indication: DVT  Duration: indefinite Supervising physician: [facility] Visit History: Patient does not report signs/symptoms of bleeding or thromboembolism  Other recent changes: Patient reports starting an antibiotic, but cannot remember the name. Tomorrow is the last day of therapy. No other diet, medications, lifestyle changes reported.  Anticoagulation Episode Summary    Current INR goal:   2.5-3.5  TTR:   56.8 % (6.2 y)  Next INR check:   08/17/2017  INR from last check:   2.9 (07/09/2017)  Weekly max warfarin dose:     Target end date:     IN[facility] check location:   [facility]  Preferred lab:     Send INR reminders to:      Indications   Pulmonary embolus (HCC) (Resolved) [I26.99] Chronic deep vein thrombosis (DVT) of both popliteal veins (HCC) [I82.533] Chronic pulmonary embolism (HCC) [I27.82] Long term (current) use of anticoagulants [Z79.01]       Comments:          No Known Allergies Medication Sig  aspirin 81 MG chewable tablet Chew 1 tablet (81 mg total) by mouth daily.  carvedilol (COREG) 6.25 MG tablet Take 1 tablet (6.25 mg total) by mouth 2 (two) times daily with a meal.  furosemide (LASIX) 80 MG tablet Take 1 tablet (80 mg total) by mouth daily.  HYDROcodone-acetaminophen (NORCO) 10-325 MG tablet Take 1 tablet by mouth every 6 (six) hours as needed.  lisinopril (PRINIVIL,ZESTRIL) 10 MG tablet Take 1 tablet (10 mg total) by mouth daily.  pantoprazole (PROTONIX) 40 MG tablet TAKE 1 TABLET BY MOUTH 30 MINUTES BEFORE LUNCH OR DINNER  rosuvastatin (CRESTOR) 5 MG tablet TAKE ONE TABLET BY MOUTH AT BEDTIME  warfarin (COUMADIN) 5 MG tablet Take one tablet by mouth once daily except on Mondays & Thursdays- take one-half tablet on Mondays & Thursdays   Past Medical History:  Diagnosis Date  . Abdominal  hernia   . CAD (coronary artery disease)    Perioperative non-STEMI March, 2014  //   cardiac catheterization at time of non-STEMI, March, 2014, minor nonobstructive coronary disease, vigorous LV function, possibly vasospastic event versus stress cardiomyopathy. No further workup of coronary disease  . CHF (congestive heart failure) ([place])   . Ejection fraction    65-70%, vigorous function, echo, March, 2011  . Ejection fraction    Vigorous function at time of catheterization October 06, 2012,  . Hypertension   . Lung nodule 12/2009   Very small, left upper lobe by CT June, 2011, one-year followup was stable  . OSA (obstructive sleep apnea)    CPAP  . Overweight(278.02)   . Pulmonary embolus ([place]) 01/16/10   on Coumadin indefinitely  . Shortness of breath   . Shoulder pain   . Warfarin anticoagulation    Social History   Socioeconomic History  . Marital status: Single    Spouse name: Not on file  . Number of children: Not on file  . Years of education: Not on file  . Highest education level: Not on file  Social Needs  . Financial resource strain: Not on file  . Food insecurity - worry: Not on file  . Food insecurity - inability: Not on file  . Transportation needs - medical: Not on file  . Transportation needs - non-medical: Not on file  Occupational History  . Not on file  Tobacco Use  . Smoking status: Former Research scientist (life sciences)  . Smokeless tobacco: Never Used  . Tobacco comment: quit 12 years ago  Substance and Sexual Activity  . Alcohol use: Yes    Alcohol/week: 3.6 oz    Types: 6 Cans of beer per week    Comment: Beer sometimes.  . Drug use: No    Comment: smoked joint 2 weeks ago  . Sexual activity: Not on file  Other Topics Concern  . Not on file  Social History Narrative   Financial assistance approved for 100% discount at [facility] and has [facility] card per Bonna Gains   12/25/2009   Family History  Problem Relation Age of Onset  . Diabetes Father   . Diabetes Sister   .  Diabetes Mother   . Ovarian cancer Mother   . Deep vein thrombosis Sister   . Bone cancer Maternal Grandmother   . Breast cancer Neg Hx    ASSESSMENT Recent Results: Lab Results  Component Value Date   INR 2.9 07/09/2017   INR 3.20 05/11/2017   INR 2.4 03/30/2017   Anticoagulation Dosing: Description   Take one (1) tablet by mouth once-daily all days except Monday and Thursday take 0.5 tablets of your 5mg  peach colored tablets at 6PM.      INR today: Therapeutic  PLAN Weekly dose was unchanged  Patient Instructions  Patient educated about medication as defined in this encounter and verbalized understanding by repeating back instructions provided.   Patient advised to contact clinic or seek medical attention if signs/symptoms of bleeding or thromboembolism occur.  Patient verbalized understanding by repeating back information and was advised to contact me if further medication-related questions arise. Patient was also provided an information handout.  Follow-up Return in about 6 weeks (around 08/20/2017).  [name]

## 2017-07-10 DIAGNOSIS — G8929 Other chronic pain: Secondary | ICD-10-CM | POA: Insufficient documentation

## 2017-07-10 LAB — CMP14 + ANION GAP
A/G RATIO: 1.3 (ref 1.2–2.2)
ALK PHOS: 66 IU/L (ref 39–117)
ALT: 13 IU/L (ref 0–32)
ANION GAP: 17 mmol/L (ref 10.0–18.0)
AST: 17 IU/L (ref 0–40)
Albumin: 4.1 g/dL (ref 3.5–5.5)
BILIRUBIN TOTAL: 0.3 mg/dL (ref 0.0–1.2)
BUN/Creatinine Ratio: 22 (ref 9–23)
BUN: 18 mg/dL (ref 6–24)
CHLORIDE: 105 mmol/L (ref 96–106)
CO2: 23 mmol/L (ref 20–29)
CREATININE: 0.82 mg/dL (ref 0.57–1.00)
Calcium: 9.5 mg/dL (ref 8.7–10.2)
GFR calc Af Amer: 91 mL/min/{1.73_m2} (ref >59)
GFR calc non Af Amer: 79 mL/min/{1.73_m2} (ref >59)
GLOBULIN, TOTAL: 3.1 g/dL (ref 1.5–4.5)
Glucose: 127 mg/dL — ABNORMAL HIGH (ref 65–99)
POTASSIUM: 4 mmol/L (ref 3.5–5.2)
SODIUM: 145 mmol/L — AB (ref 134–144)
Total Protein: 7.2 g/dL (ref 6.0–8.5)

## 2017-07-10 LAB — VITAMIN D 25 HYDROXY (VIT D DEFICIENCY, FRACTURES): Vit D, 25-Hydroxy: 25.5 ng/mL — ABNORMAL LOW (ref 30.0–100.0)

## 2017-07-10 NOTE — Assessment & Plan Note (Signed)
Again stressed weight loss.

## 2017-07-10 NOTE — Assessment & Plan Note (Signed)
HPI: Appears more fatigued today, she reports not using CPAP due to lack of supplies, apparently has a bill outstanding.  A: OSA  P: She reports she will resolve her CPAP issues and resume use.

## 2017-07-10 NOTE — Assessment & Plan Note (Signed)
HPI: Continues to c/o chronic pain, predominately both knees but right> left, last steroid injection has helped a lot.  She also notes her chronic left shoulder pain and some right heel pain (plantar fascitis). As well as chronic low back pain. Notes with the cold weather pains have been worse.  Most days she takes 3 pills of hydrocodone-apap.  Infrequently she takes 2 pills and she does reqiure 4 pills some days.  Overall feels she gets a lot of benefit from the Hydrocodone medications.  A: Chronic pain due to Primary OA of multiple joints due to obesity, as well as left rotator cuff tendonopathy  P: Discussed again AE of opioid medications and limiting use. We are avoiding NSAIDS due to increased bleeding risk.  Does not have much of a neuropathic component but we may need to reconsider duloxetine and lyrica in the future.  She continues to want to avoid any surgical options due to previous perioperative CVA Today will add lidocaine 5% onitment Will have limited increase in Norco-APAP 10-325 to #100 pills a month (5 pill increase) -3 month rx provided.

## 2017-07-10 NOTE — Assessment & Plan Note (Signed)
Takin 2000IU daily of vit d3, leg cramps have resolved  Repeat Vit d level today is still 25, will have her take 2 pills on MWF and 1 on other days

## 2017-07-10 NOTE — Assessment & Plan Note (Signed)
HPI: Taking lisinopril, no complaints.  A: Essential HTN, above goal  P: rechecked and remained slightly elevated. Discussed that this is likely related to her lack of use of CPAP as well as weight gain but would recommend increase to 20mg  lisinopril.  However she assures she will work on wegiht loss and pay bill for CPAP mask and start using again, in that case we will continue lisinopril 10mg  daily.

## 2017-08-04 ENCOUNTER — Telehealth: Payer: Self-pay | Admitting: *Deleted

## 2017-08-04 NOTE — Telephone Encounter (Signed)
[facility], it is not in this state, calls and ask for appr 5 verbal scripts. Folic acid supplement, 2 creams, lidocaine ointment( she already receives this from a local pharm) , flexeril and another vitamin supplement. She could not give reason pt was needing these. She was informed pt would need to be seen and request these for this pharmacy at that time.

## 2017-08-04 NOTE — Telephone Encounter (Signed)
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  At the last visit she wanted to keep her current pharmacy, which I think is the plan unless we here otherwise from [place].

## 2017-08-17 ENCOUNTER — Ambulatory Visit (INDEPENDENT_AMBULATORY_CARE_PROVIDER_SITE_OTHER): Payer: Medicare Other | Admitting: Pharmacy Technician

## 2017-08-17 DIAGNOSIS — Z7901 Long term (current) use of anticoagulants: Secondary | ICD-10-CM | POA: Diagnosis not present

## 2017-08-17 DIAGNOSIS — I2782 Chronic pulmonary embolism: Secondary | ICD-10-CM | POA: Diagnosis not present

## 2017-08-17 DIAGNOSIS — I2699 Other pulmonary embolism without acute cor pulmonale: Secondary | ICD-10-CM

## 2017-08-17 DIAGNOSIS — I82533 Chronic embolism and thrombosis of popliteal vein, bilateral: Secondary | ICD-10-CM | POA: Diagnosis not present

## 2017-08-17 DIAGNOSIS — Z5181 Encounter for therapeutic drug level monitoring: Secondary | ICD-10-CM

## 2017-08-17 LAB — POCT INR: INR: 3.5

## 2017-08-17 NOTE — Patient Instructions (Signed)
Patient instructed to take medications as defined in the Anti-coagulation Track section of this encounter.  Patient instructed to take today's dose.  Patient instructed to take one (1) tablet by mouth once-daily all days except Monday and Thursday take 0.5 tablets of your 5mg  peach colored tablets at [facility].  Patient verbalized understanding of these instructions.

## 2017-08-17 NOTE — Progress Notes (Signed)
Anticoagulation Management Breanna Wilkins is a 57 y.o. female who reports to the clinic for monitoring of warfarin treatment.    Indication: DVT, PE and long term anticoagulation  Duration: indefinite Supervising physician: Joni Reining  Anticoagulation Clinic Visit History: Patient does not report signs/symptoms of bleeding or thromboembolism  Other recent changes: No diet, medications, or lifestyle changes reported at this visit. Anticoagulation Episode Summary    Current INR goal:   2.5-3.5  TTR:   57.5 % (6.3 y)  Next INR check:   09/28/2017  INR from last check:   3.5 (08/17/2017)  Weekly max warfarin dose:     Target end date:     [facility] check [facility]  Preferred lab:     Send INR reminders to:      Indications   Pulmonary embolus (HCC) (Resolved) [I26.99] Chronic deep vein thrombosis (DVT) of both popliteal veins (HCC) [I82.533] Chronic pulmonary embolism (HCC) [I27.82] Long term (current) use of anticoagulants [Z79.01]       Comments:           No Known Allergies Prior to Admission medications   Medication Sig Start Date End Date Taking? Authorizing Provider  aspirin 81 MG chewable tablet Chew 1 tablet (81 mg total) by mouth daily. 10/08/12  Yes Hosie Poisson, MD  carvedilol (COREG) 6.25 MG tablet Take 1 tablet (6.25 mg total) by mouth 2 (two) times daily with a meal. 05/04/17  Yes Lucious Groves, DO  furosemide (LASIX) 80 MG tablet Take 1 tablet (80 mg total) by mouth daily. 11/12/16  Yes Lucious Groves, DO  HYDROcodone-acetaminophen (NORCO) 10-325 MG tablet Take 1 tablet by mouth every 6 (six) hours as needed. 07/09/17  Yes Lucious Groves, DO  lidocaine (XYLOCAINE) 5 % ointment Apply 1 application topically 2 (two) times daily as needed. 07/09/17  Yes Lucious Groves, DO  lisinopril (PRINIVIL,ZESTRIL) 10 MG tablet Take 1 tablet (10 mg total) by mouth daily. 04/21/17 04/21/18 Yes Lucious Groves, DO  pantoprazole (PROTONIX) 40 MG tablet TAKE 1  TABLET BY MOUTH 30 MINUTES BEFORE LUNCH OR DINNER 04/21/17  Yes Joni Reining C, DO  rosuvastatin (CRESTOR) 5 MG tablet TAKE ONE TABLET BY MOUTH AT BEDTIME 05/29/17  Yes Lucious Groves, DO  warfarin (COUMADIN) 5 MG tablet Take one tablet by mouth once daily except on Mondays & Thursdays- take one-half tablet on Mondays & Thursdays 02/04/17  Yes Bartholomew Crews, MD   Past Medical History:  Diagnosis Date  . Abdominal hernia   . CAD (coronary artery disease)    Perioperative non-STEMI March, 2014  //   cardiac catheterization at time of non-STEMI, March, 2014, minor nonobstructive coronary disease, vigorous LV function, possibly vasospastic event versus stress cardiomyopathy. No further workup of coronary disease  . CHF (congestive heart failure) ([place])   . Ejection fraction    65-70%, vigorous function, echo, March, 2011  . Ejection fraction    Vigorous function at time of catheterization October 06, 2012,  . Hypertension   . Lung nodule 12/2009   Very small, left upper lobe by CT June, 2011, one-year followup was stable  . OSA (obstructive sleep apnea)    CPAP  . Overweight(278.02)   . Pulmonary embolus ([place]) 01/16/10   on Coumadin indefinitely  . Shortness of breath   . Shoulder pain   . Warfarin anticoagulation    Social History   Socioeconomic History  . Marital status: Single    Spouse name:  Not on file  . Number of children: Not on file  . Years of education: Not on file  . Highest education level: Not on file  Social Needs  . Financial resource strain: Not on file  . Food insecurity - worry: Not on file  . Food insecurity - inability: Not on file  . Transportation needs - medical: Not on file  . Transportation needs - non-medical: Not on file  Occupational History  . Not on file  Tobacco Use  . Smoking status: Former Research scientist (life sciences)  . Smokeless tobacco: Never Used  . Tobacco comment: quit 12 years ago  Substance and Sexual Activity  . Alcohol use: Yes    Alcohol/week: 3.6  oz    Types: 6 Cans of beer per week    Comment: Beer sometimes.  . Drug use: No    Comment: smoked joint 2 weeks ago  . Sexual activity: Not on file  Other Topics Concern  . Not on file  Social History Narrative   Financial assistance approved for 100% discount at [facility] and has [facility] card per Bonna Gains   12/25/2009   Family History  Problem Relation Age of Onset  . Diabetes Father   . Diabetes Sister   . Diabetes Mother   . Ovarian cancer Mother   . Deep vein thrombosis Sister   . Bone cancer Maternal Grandmother   . Breast cancer Neg Hx     ASSESSMENT Recent Results: The most recent result is correlated with 30 mg per week: Lab Results  Component Value Date   INR 3.5 08/17/2017   INR 2.9 07/09/2017   INR 3.20 05/11/2017    Anticoagulation Dosing: Description   Take one (1) tablet by mouth once-daily all days except Monday and Thursday take 0.5 tablets of your 5mg  peach colored tablets at 6PM.      INR today: Therapeutic  PLAN Weekly dose was unchanged   Patient Instructions  Patient instructed to take medications as defined in the Anti-coagulation Track section of this encounter.  Patient instructed to take today's dose.  Patient instructed to take one (1) tablet by mouth once-daily all days except Monday and Thursday take 0.5 tablets of your 5mg  peach colored tablets at [facility].  Patient verbalized understanding of these instructions.     Patient advised to contact clinic or seek medical attention if signs/symptoms of bleeding or thromboembolism occur.  Patient verbalized understanding by repeating back information and was advised to contact me if further medication-related questions arise. Patient was also provided an information handout.  Follow-up Return in about 6 weeks (around 09/28/2017) for INR follow up at 1015.  Bertis Ruddy, PharmD Pharmacy Resident Pager #: [phone] 08/17/2017 11:54 AM  15 minutes spent face-to-face with the patient during the  encounter. 50% of time spent on education. 50% of time was spent on INR point of care testing, analysis, and documentation in the EMR.

## 2017-08-18 NOTE — Progress Notes (Signed)
INTERNAL MEDICINE TEACHING ATTENDING ADDENDUM - Breanna Groves, DO Duration- indefinate, Indication- VTE, INR-  Lab Results  Component Value Date   INR 3.5 08/17/2017  . Agree with pharmacy recommendations as outlined in their note.

## 2017-08-19 IMAGING — MG MM DIGITAL SCREENING BILAT
4 series · 4 of 4 positions shown · non-contrast
Comparison: Previous exam(s).

CLINICAL DATA: Screening.

EXAM:
DIGITAL SCREENING BILATERAL MAMMOGRAM WITH CAD

[L CC]
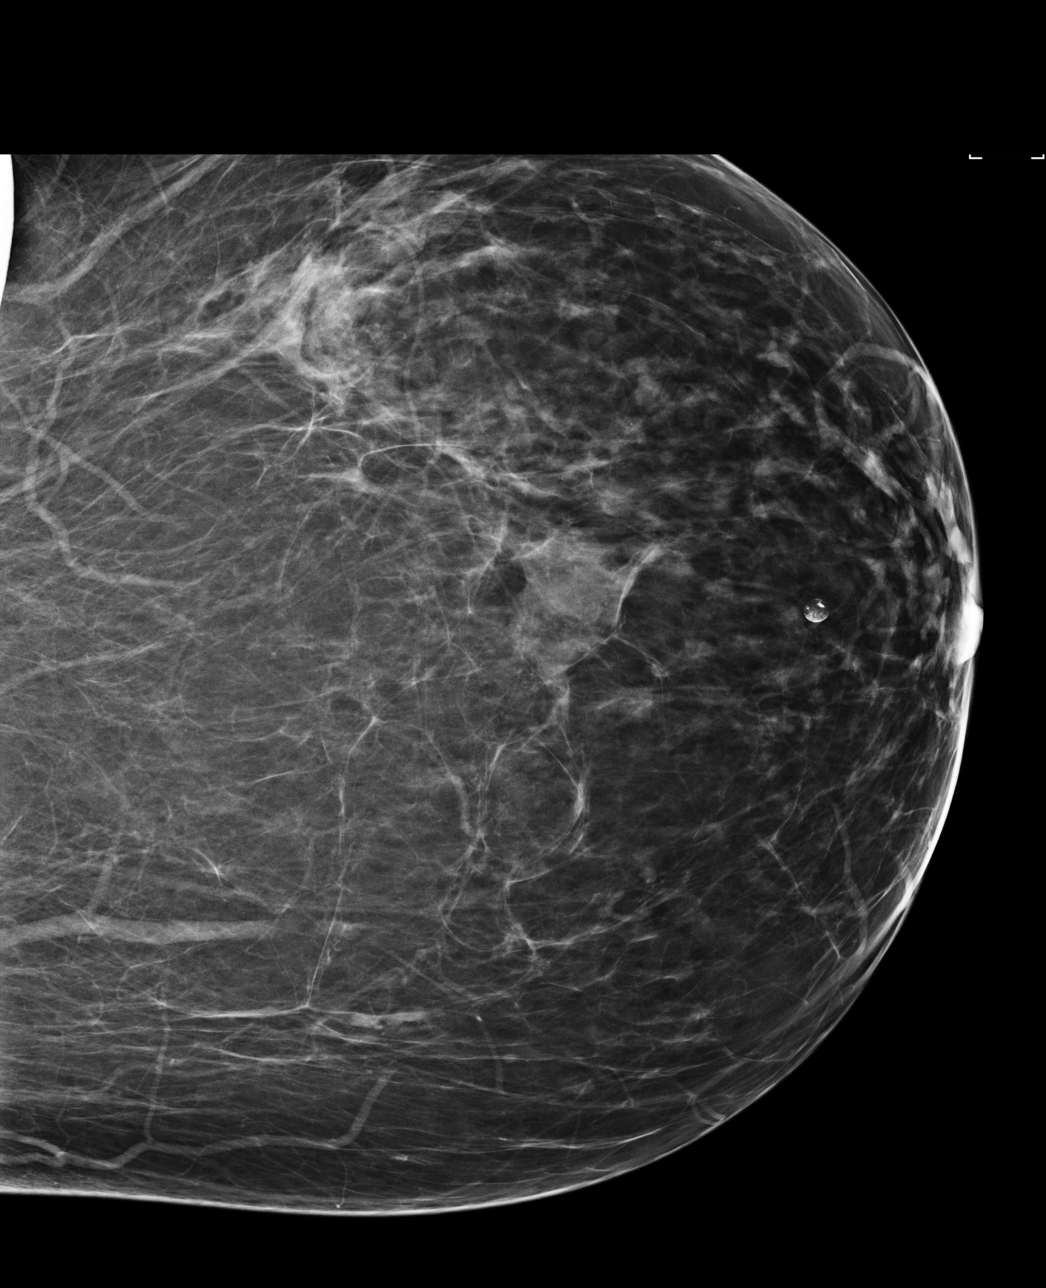

[R MLO]
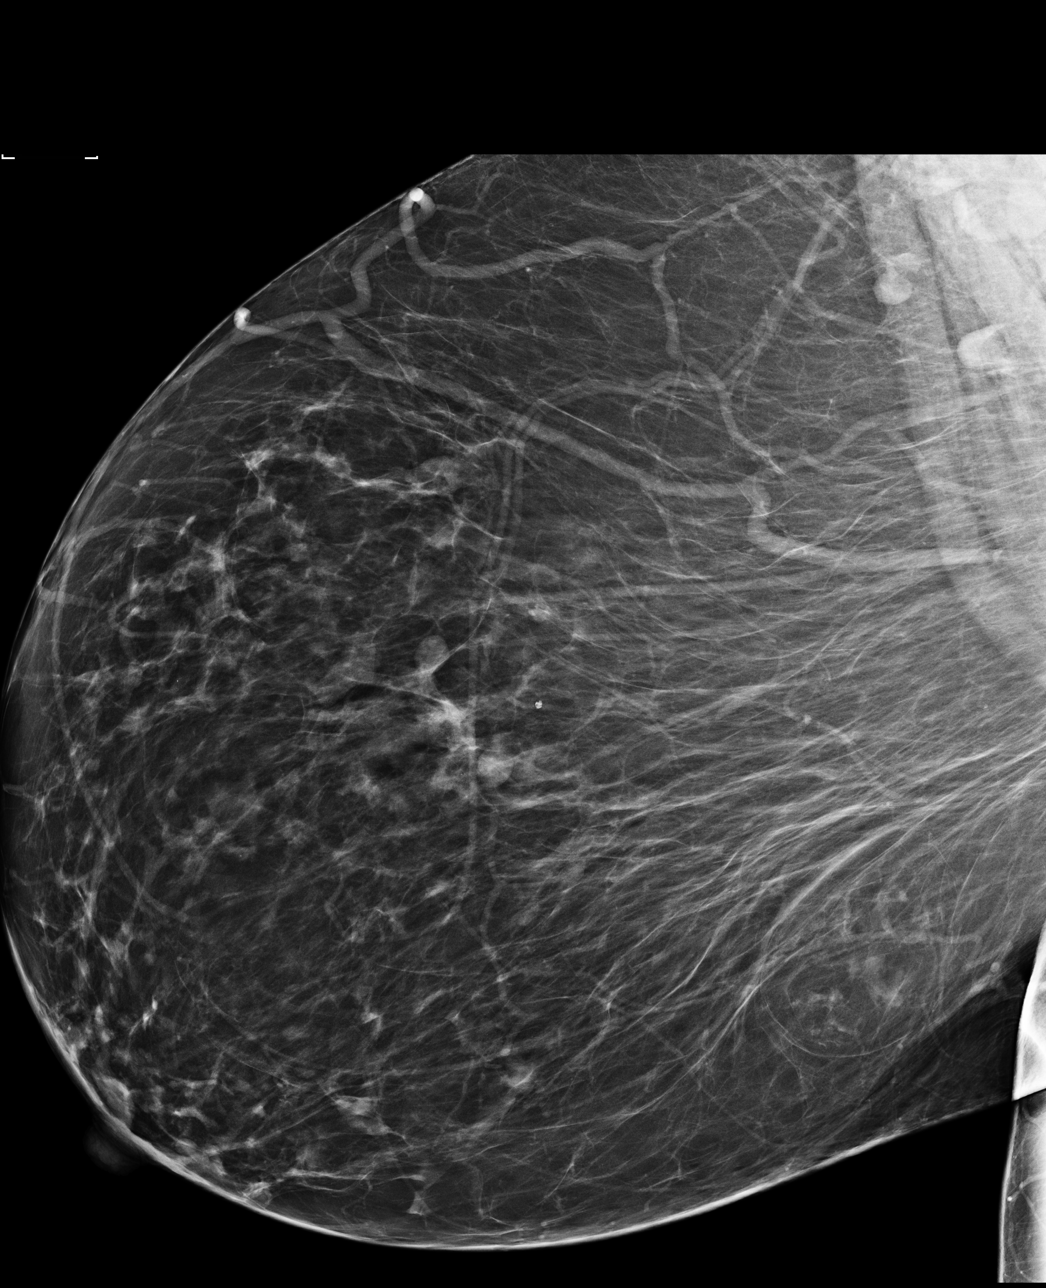

[L MLO]
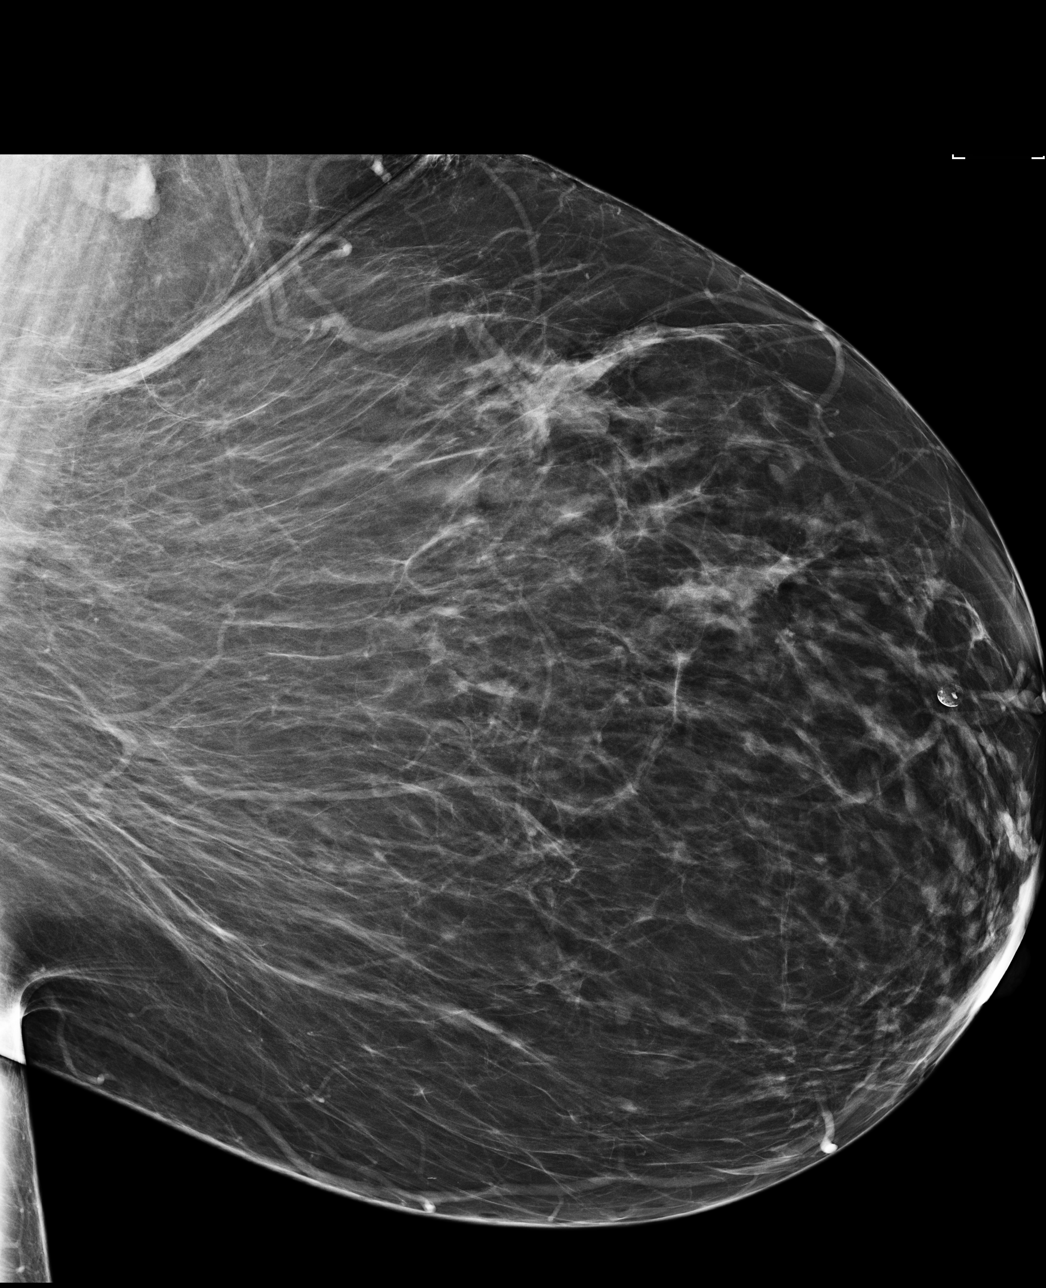

[R CC]
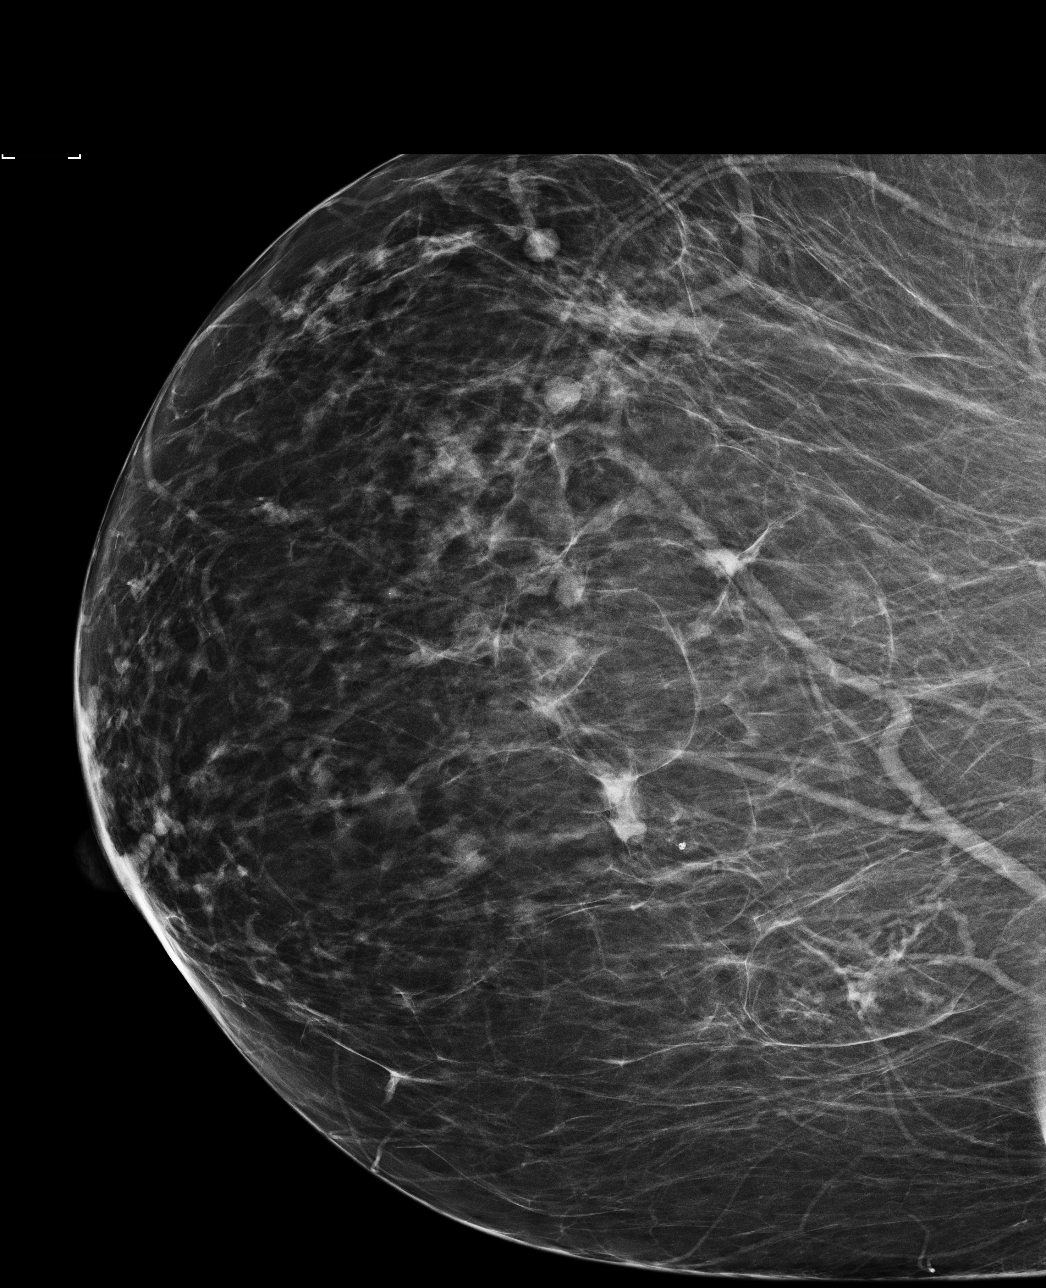

[4 of 4 positions shown; findings below may reference images not displayed]

ACR Breast Density Category c: The breast tissue is heterogeneously
dense, which may obscure small masses.
FINDINGS: There are no findings suspicious for malignancy. Images were
processed with CAD.
IMPRESSION: No mammographic evidence of malignancy. A result letter of this
screening mammogram will be mailed directly to the patient.

RECOMMENDATION:
Screening mammogram in one year. (Code:[id number])

BI-RADS CATEGORY  1: Negative.

## 2017-08-21 ENCOUNTER — Other Ambulatory Visit: Payer: Self-pay

## 2017-08-21 NOTE — Telephone Encounter (Signed)
HYDROcodone-acetaminophen (NORCO) 10-325 MG tablet, refill request.

## 2017-08-21 NOTE — Telephone Encounter (Signed)
Called pharm, 2 refills on file, last filled 1/5, will fill 2/4, informed pt

## 2017-08-25 ENCOUNTER — Encounter: Payer: Self-pay | Admitting: Internal Medicine

## 2017-08-25 ENCOUNTER — Ambulatory Visit (INDEPENDENT_AMBULATORY_CARE_PROVIDER_SITE_OTHER): Payer: Medicare Other | Admitting: Internal Medicine

## 2017-08-25 VITALS — BP 119/50 | HR 85 | Temp 97.8°F | Wt 314.7 lb

## 2017-08-25 DIAGNOSIS — I2510 Atherosclerotic heart disease of native coronary artery without angina pectoris: Secondary | ICD-10-CM

## 2017-08-25 DIAGNOSIS — I509 Heart failure, unspecified: Secondary | ICD-10-CM

## 2017-08-25 DIAGNOSIS — Z79899 Other long term (current) drug therapy: Secondary | ICD-10-CM

## 2017-08-25 DIAGNOSIS — K625 Hemorrhage of anus and rectum: Secondary | ICD-10-CM | POA: Diagnosis not present

## 2017-08-25 DIAGNOSIS — Z7901 Long term (current) use of anticoagulants: Secondary | ICD-10-CM | POA: Diagnosis not present

## 2017-08-25 DIAGNOSIS — I110 Hypertensive heart disease with heart failure: Secondary | ICD-10-CM

## 2017-08-25 LAB — POCT INR: INR: 3.3

## 2017-08-25 NOTE — Progress Notes (Signed)
   CC: Bright red blood per rectum  HPI:  [name] is a 57 y.o. with cad, chf, and essential htn who presented to be seen for bright red blood per rectum. Please see problem based charting for evaluation, assessment, and plan.   Past Medical History:  Diagnosis Date  . Abdominal hernia   . CAD (coronary artery disease)    Perioperative non-STEMI March, 2014  //   cardiac catheterization at time of non-STEMI, March, 2014, minor nonobstructive coronary disease, vigorous LV function, possibly vasospastic event versus stress cardiomyopathy. No further workup of coronary disease  . CHF (congestive heart failure) ([place])   . Ejection fraction    65-70%, vigorous function, echo, March, 2011  . Ejection fraction    Vigorous function at time of catheterization October 06, 2012,  . Hypertension   . Lung nodule 12/2009   Very small, left upper lobe by CT June, 2011, one-year followup was stable  . OSA (obstructive sleep apnea)    CPAP  . Overweight(278.02)   . Pulmonary embolus ([place]) 01/16/10   on Coumadin indefinitely  . Shortness of breath   . Shoulder pain   . Warfarin anticoagulation    Review of Systems:  Shortness of breath, anal itching  Physical Exam:  Vitals:   08/25/17 1034  BP: (!) 119/50  Pulse: 85  Temp: 97.8 F (36.6 C)  TempSrc: Oral  SpO2: 99%  Weight: (!) 314 lb 11.2 oz (142.7 kg)   Physical Exam  Constitutional: She appears well-developed and well-nourished. No distress.  HENT:  Head: Normocephalic and atraumatic.  Eyes: Conjunctivae are normal.  Cardiovascular: Normal rate, regular rhythm and normal heart sounds.  Respiratory: Effort normal and breath sounds normal. No respiratory distress. She has no wheezes.  GI: Soft. Bowel sounds are normal. She exhibits no distension. There is no tenderness.  Genitourinary:  Genitourinary Comments: No anal fissures, external or internal hemorrhoids visualized  Musculoskeletal: She exhibits no edema.    Neurological: She is alert.  Skin: She is not diaphoretic. No erythema.  Psychiatric: She has a normal mood and affect. Her behavior is normal. Judgment and thought content normal.    Assessment & Plan:   See Encounters Tab for problem based charting.  Patient discussed with Dr. Evette Doffing

## 2017-08-25 NOTE — Patient Instructions (Addendum)
It was a pleasure to see you today [name]. Please make the following changes:  -continue taking miralax -Please get colonoscopy done -Please follow up with Dr. Elie Confer regarding warfarin dosing  If you have any questions or concerns, please call our clinic at [phone] between 9am-5pm and after hours call [phone] and ask for the internal medicine resident on call. If you feel you are having a medical emergency please call 911.   Thank you, we look forward to help you remain healthy!  Breanna Mage, MD Internal Medicine PGY1

## 2017-08-25 NOTE — Assessment & Plan Note (Signed)
The patient noted bright red blood per rectum for the past 1 month. She states that she has noticed streaks of blood on toilet paper when wiping. She mentioned that she typically has 1-2 bowel movements per day. She usually uses miralax, but stopped using it 4 months ago. She denies any blood on stool or change in stool color. She describes the stool to be type 1 on the bristol stool chart. She states that she has had some accompanied itching noted around her anal area. She has done warm baths which help.   The patient has not had any major changes in diet. She has not been eating extensive amounts of green leafy vegetables. She has decreased the amount of alcohol she has been drinking. she previously used to have 4-5 beers per week and now it is 2-3 beers per week. She states that she eats a lot of spicy foods. She has not had any new medications in the last month.   The patient's INR at this visit was 3.3. The patient's last colonoscopy was done in 2011 when she did not note any polyps or malignancy characteristics. She has a sister that has colonic polyps.   FOBT was negative. DRE did not not any internal or external hemorrhoids.  Since there is no explanation for the patient's blood per rectum noted on exam, will order a colonoscopy  -CBC ordered -Colonoscopy ordered -Referral to Dr. Elie Confer to adjust INR to be closer to 2

## 2017-08-26 ENCOUNTER — Encounter: Payer: Self-pay | Admitting: Internal Medicine

## 2017-08-26 ENCOUNTER — Telehealth: Payer: Self-pay | Admitting: Pharmacist

## 2017-08-26 ENCOUNTER — Encounter: Payer: Self-pay | Admitting: *Deleted

## 2017-08-26 LAB — CBC
Hematocrit: 39 % (ref 34.0–46.6)
Hemoglobin: 12.5 g/dL (ref 11.1–15.9)
MCH: 27.5 pg (ref 26.6–33.0)
MCHC: 32.1 g/dL (ref 31.5–35.7)
MCV: 86 fL (ref 79–97)
PLATELETS: 329 10*3/uL (ref 150–379)
RBC: 4.54 x10E6/uL (ref 3.77–5.28)
RDW: 15.4 % (ref 12.3–15.4)
WBC: 7.5 10*3/uL (ref 3.4–10.8)

## 2017-08-26 NOTE — Telephone Encounter (Signed)
Asked by Dr. Maricela Bo to lower the INR for now--based upon an office visit she had with the patient who reported to the IM [facility] endorsing BRBPR for one month's duration. Of note--when the the patient was seen on 28-JAN-19 she did not endorse this symptom at that time. I have reviewed the clinic note and physical exam findings of Dr. Maricela Bo. I have been asked--while awaiting GI Medicine Consult for possibility of colonoscopy--to lower her INR target value to "2.0" per Dr. Maricela Bo. Will do so. Patient has been called and provided the following instructions:  Based upon you 5-mg peach-colored warfarin tablets, take 1/2 tablet (2.5mg ) for today, February 6, tomorrow--February 7, and Friday--February 8th. On Saturday, take 1x5mg  warfarin tablet. On Sunday February 10, take 1x5mg  warfarin tablet. On Monday 11-FEB-19 return to IM [facility] for repeat INR.

## 2017-08-26 NOTE — Addendum Note (Signed)
Addended by: Lalla Brothers T on: 08/26/2017 11:17 AM   Modules accepted: Level of Service

## 2017-08-26 NOTE — Progress Notes (Signed)
Internal Medicine Clinic Attending  Case discussed with Dr. Chundi at the time of the visit.  We reviewed the resident's history and exam and pertinent patient test results.  I agree with the assessment, diagnosis, and plan of care documented in the resident's note. 

## 2017-08-31 ENCOUNTER — Ambulatory Visit (INDEPENDENT_AMBULATORY_CARE_PROVIDER_SITE_OTHER): Payer: Medicare Other

## 2017-08-31 ENCOUNTER — Ambulatory Visit (INDEPENDENT_AMBULATORY_CARE_PROVIDER_SITE_OTHER): Payer: Medicare Other | Admitting: *Deleted

## 2017-08-31 DIAGNOSIS — Z23 Encounter for immunization: Secondary | ICD-10-CM

## 2017-08-31 DIAGNOSIS — I82533 Chronic embolism and thrombosis of popliteal vein, bilateral: Secondary | ICD-10-CM | POA: Diagnosis not present

## 2017-08-31 DIAGNOSIS — I2782 Chronic pulmonary embolism: Secondary | ICD-10-CM | POA: Diagnosis not present

## 2017-08-31 DIAGNOSIS — Z7901 Long term (current) use of anticoagulants: Secondary | ICD-10-CM

## 2017-08-31 LAB — POCT INR
INR: 3.5
INR: 3.5

## 2017-08-31 NOTE — Patient Instructions (Signed)
Patient instructed to take medications as defined in the Anti-coagulation Track section of this encounter.  Patient instructed to take today's dose. Due to your upcoming procedure, hold Tuesday 2/12 and then take one whole tablet of the 5mg  tablets on Sunday and Thursday. Then take 1/2 of the tablets on all other days. Also, follow up with the clinic for when she has completed colonoscopy for closer follow up on her INR. She understands that we are targeting a lower goal for her with the upcoming colonoscopy and will call when she knows more information.  Patient verbalized understanding of these instructions.

## 2017-08-31 NOTE — Progress Notes (Signed)
Anticoagulation Management Breanna Wilkins is a 57 y.o. female who reports to the clinic for monitoring of warfarin treatment.    Indication: DVT and PE  Duration: indefinite Supervising physician: Lenice Pressman, MD  Anticoagulation Clinic Visit History: Patient does not report signs/symptoms of bleeding or thromboembolism. Other recent changes: denies any diet, medications, lifestyle changes Anticoagulation Episode Summary    Current INR goal:   2.5-3.5  TTR:   57.8 % (6.4 y)  Next INR check:   09/14/2017  INR from last check:   3.5 (08/31/2017)  Weekly max warfarin dose:     Target end date:     INR check location:   [facility]  Preferred lab:     Send INR reminders to:      Indications   Pulmonary embolus (HCC) (Resolved) [I26.99] Chronic deep vein thrombosis (DVT) of both popliteal veins (HCC) [I82.533] Chronic pulmonary embolism (HCC) [I27.82] Long term (current) use of anticoagulants [Z79.01]       Comments:           No Known Allergies Prior to Admission medications   Medication Sig Start Date End Date Taking? Authorizing Provider  aspirin 81 MG chewable tablet Chew 1 tablet (81 mg total) by mouth daily. 10/08/12   Hosie Poisson, MD  carvedilol (COREG) 6.25 MG tablet Take 1 tablet (6.25 mg total) by mouth 2 (two) times daily with a meal. 05/04/17   Lucious Groves, DO  furosemide (LASIX) 80 MG tablet Take 1 tablet (80 mg total) by mouth daily. 11/12/16   Lucious Groves, DO  HYDROcodone-acetaminophen (NORCO) 10-325 MG tablet Take 1 tablet by mouth every 6 (six) hours as needed. 07/09/17   Lucious Groves, DO  lidocaine (XYLOCAINE) 5 % ointment Apply 1 application topically 2 (two) times daily as needed. 07/09/17   Lucious Groves, DO  lisinopril (PRINIVIL,ZESTRIL) 10 MG tablet Take 1 tablet (10 mg total) by mouth daily. 04/21/17 04/21/18  Lucious Groves, DO  pantoprazole (PROTONIX) 40 MG tablet TAKE 1 TABLET BY MOUTH 30 MINUTES BEFORE LUNCH OR DINNER 04/21/17    Lucious Groves, DO  rosuvastatin (CRESTOR) 5 MG tablet TAKE ONE TABLET BY MOUTH AT BEDTIME 05/29/17   Lucious Groves, DO  warfarin (COUMADIN) 5 MG tablet Take one tablet by mouth once daily except on Mondays & Thursdays- take one-half tablet on Mondays & Thursdays 02/04/17   Bartholomew Crews, MD   Past Medical History:  Diagnosis Date  . Abdominal hernia   . CAD (coronary artery disease)    Perioperative non-STEMI March, 2014  //   cardiac catheterization at time of non-STEMI, March, 2014, minor nonobstructive coronary disease, vigorous LV function, possibly vasospastic event versus stress cardiomyopathy. No further workup of coronary disease  . CHF (congestive heart failure) ([place])   . Ejection fraction    65-70%, vigorous function, echo, March, 2011  . Ejection fraction    Vigorous function at time of catheterization October 06, 2012,  . Hypertension   . Lung nodule 12/2009   Very small, left upper lobe by CT June, 2011, one-year followup was stable  . OSA (obstructive sleep apnea)    CPAP  . Overweight(278.02)   . Pulmonary embolus ([place]) 01/16/10   on Coumadin indefinitely  . Shortness of breath   . Shoulder pain   . Warfarin anticoagulation    Social History   Socioeconomic History  . Marital status: Single    Spouse name: Not on file  . Number of  children: Not on file  . Years of education: Not on file  . Highest education level: Not on file  Social Needs  . Financial resource strain: Not on file  . Food insecurity - worry: Not on file  . Food insecurity - inability: Not on file  . Transportation needs - medical: Not on file  . Transportation needs - non-medical: Not on file  Occupational History  . Not on file  Tobacco Use  . Smoking status: Former Research scientist (life sciences)  . Smokeless tobacco: Never Used  . Tobacco comment: quit 12 years ago  Substance and Sexual Activity  . Alcohol use: Yes    Alcohol/week: 3.6 oz    Types: 6 Cans of beer per week    Comment: Beer sometimes.   . Drug use: No    Comment: smoked joint 2 weeks ago  . Sexual activity: Not on file  Other Topics Concern  . Not on file  Social History Narrative   Financial assistance approved for 100% discount at [facility] and has [facility] card per Bonna Gains   12/25/2009   Family History  Problem Relation Age of Onset  . Diabetes Father   . Diabetes Sister   . Colon polyps Sister   . Diabetes Mother   . Ovarian cancer Mother   . Deep vein thrombosis Sister   . Bone cancer Maternal Grandmother   . Breast cancer Neg Hx     ASSESSMENT Recent Results: The most recent result is correlated with  25 mg per week: Lab Results  Component Value Date   INR 3.5 08/31/2017   INR 3.5 08/31/2017   INR 3.3 08/25/2017    Anticoagulation Dosing: Description   Due to your upcoming procedure, hold Tuesday 2/12 and then take one whole tablet of the 5mg  tablets on Sunday and Thursday. Then take 1/2 of the tablets on all other days.     INR today: Therapeutic  PLAN Weekly dose was decreased by 10% to 22.5 mg per week  Patient Instructions  Patient instructed to take medications as defined in the Anti-coagulation Track section of this encounter.  Patient instructed to take today's dose. Due to your upcoming procedure, hold Tuesday 2/12 and then take one whole tablet of the 5mg  tablets on Sunday and Thursday. Then take 1/2 of the tablets on all other days. Also, follow up with the clinic for when she has completed colonoscopy for closer follow up on her INR. She understands that we are targeting a lower goal for her with the upcoming colonoscopy and will call when she knows more information.  Patient verbalized understanding of these instructions.    Patient advised to contact clinic or seek medical attention if signs/symptoms of bleeding or thromboembolism occur.  Patient verbalized understanding by repeating back information and was advised to contact me if further medication-related questions arise. Patient  was also provided an information handout.  Follow-up Return in about 2 weeks (around 09/14/2017) for INR follow up at 10:15 on 2/25.  Patterson Hammersmith PharmD PGY1 Pharmacy Practice Resident 08/31/2017 11:15 AM  20 minutes spent face-to-face with the patient during the encounter. 50% of time spent on education. We discussed why we are targeting a lower INR goal and that closer follow up will be needed. 50% of time was spent on collection of INR sample, adjusting medication based on upcoming procedure, documentation into [email or web address] and EPIC.

## 2017-09-01 NOTE — Progress Notes (Signed)
INTERNAL MEDICINE TEACHING ATTENDING ADDENDUM  I agree with pharmacy recommendations as outlined in their note.   Alexander N Raines, MD  

## 2017-09-03 ENCOUNTER — Telehealth: Payer: Self-pay | Admitting: Emergency Medicine

## 2017-09-03 ENCOUNTER — Ambulatory Visit (INDEPENDENT_AMBULATORY_CARE_PROVIDER_SITE_OTHER): Payer: Medicare Other | Admitting: Internal Medicine

## 2017-09-03 ENCOUNTER — Encounter: Payer: Self-pay | Admitting: Internal Medicine

## 2017-09-03 VITALS — BP 132/80 | HR 72 | Ht 67.0 in | Wt 314.4 lb

## 2017-09-03 DIAGNOSIS — K625 Hemorrhage of anus and rectum: Secondary | ICD-10-CM | POA: Diagnosis not present

## 2017-09-03 DIAGNOSIS — Z8371 Family history of colonic polyps: Secondary | ICD-10-CM

## 2017-09-03 DIAGNOSIS — Z7901 Long term (current) use of anticoagulants: Secondary | ICD-10-CM | POA: Diagnosis not present

## 2017-09-03 MED ORDER — NA SULFATE-K SULFATE-MG SULF 17.5-3.13-1.6 GM/177ML PO SOLN: 1.0000 | ORAL | 0 refills | Status: DC

## 2017-09-03 NOTE — Telephone Encounter (Signed)
   Breanna Wilkins 12/23/1960 [id number]    We have scheduled the above named patient for a colonoscopy procedure. Our records show that she is on anticoagulation therapy.  Please advise as to whether the patient may come off their therapy of Coumadin 5 days prior to their procedure which is scheduled for 10-14-17 and if she needs Lovenox bridging.  Please route your response to Tinnie Gens, CMA or fax response to [phone].  Sincerely,    [facility]

## 2017-09-03 NOTE — Telephone Encounter (Signed)
   Breanna Wilkins 12/19/1960 [id number]   We have scheduled the above named patient for a colonoscopy procedure. Our records show that she is on anticoagulation therapy.  Please advise as to whether the patient may come off their therapy of Coumadin 5 days prior to their procedure which is scheduled for 10-14-17 and will she need Lovenox bridging.   Please route your response to Tinnie Gens, CMA or fax response to [phone].  Sincerely,    [facility]

## 2017-09-03 NOTE — Progress Notes (Signed)
Patient ID: Breanna Wilkins, female   DOB: 04-13-1961, 57 y.o.   MRN: [id number] HPI: Breanna Wilkins is a 57 year old female with a past medical history of DVT/PE on warfarin, CAD, CHF with normal EF, sleep apnea, obesity who is seen in consultation at the request of Dr. Maricela Bo to evaluate bright red blood per rectum.  She is here alone today.  She reports that she had several episodes of blood with bowel movement and with wiping several weeks ago.  She has not seen anymore.  This did correspond with some constipation related to her pain medication.  At that time she noted the blood in bleeding to be painless but she did have some perianal itching.  She resumed her MiraLAX which she had become less adherent to, and her constipation has resolved.  She is also seen no further bleeding.  She denies abdominal pain.  Denies upper GI and hepatobiliary complaint.  She had a colonoscopy on 10/31/2009 with Dr. Sharlett Iles.  This was normal.  She had a rectal exam which did not reveal any evidence of internal, external hemorrhoids or fissure when performed by primary care.  Family history notable for colon polyps in both of her sisters and her mother and father.  No known family history of colon cancer.  Past Medical History:  Diagnosis Date  . Abdominal hernia   . CAD (coronary artery disease)    Perioperative non-STEMI March, 2014  //   cardiac catheterization at time of non-STEMI, March, 2014, minor nonobstructive coronary disease, vigorous LV function, possibly vasospastic event versus stress cardiomyopathy. No further workup of coronary disease  . CHF (congestive heart failure) ([place])   . Ejection fraction    65-70%, vigorous function, echo, March, 2011  . Ejection fraction    Vigorous function at time of catheterization October 06, 2012,  . Hypertension   . Lung nodule 12/2009   Very small, left upper lobe by CT June, 2011, one-year followup was stable  . OSA (obstructive sleep apnea)    CPAP  .  Overweight(278.02)   . Pulmonary embolus ([place]) 01/16/10   on Coumadin indefinitely  . Shortness of breath   . Shoulder pain   . Warfarin anticoagulation     Past Surgical History:  Procedure Laterality Date  . ABDOMINAL HYSTERECTOMY  07/2002   laparoscopic assisted vaginal hysterectomy for menorrhagia, dysmenorrhea, anemia, fibroids  . INCISIONAL HERNIA REPAIR N/A 09/27/2012   Procedure: LAPAROSCOPIC INCISIONAL HERNIA possible open;  Surgeon: Adin Hector, MD;  Location: [place];  Service: General;  Laterality: N/A;  . INSERTION OF MESH N/A 09/27/2012   Procedure: INSERTION OF MESH;  Surgeon: Adin Hector, MD;  Location: [place];  Service: General;  Laterality: N/A;  . KNEE ARTHROSCOPY Left   . LEFT HEART CATHETERIZATION WITH CORONARY ANGIOGRAM N/A 10/06/2012   Procedure: LEFT HEART CATHETERIZATION WITH CORONARY ANGIOGRAM;  Surgeon: Sherren Mocha, MD;  Location[facility] CATH LAB;  Service: Cardiovascular;  Laterality: N/A;    Outpatient Medications Prior to Visit  Medication Sig Dispense Refill  . aspirin 81 MG chewable tablet Chew 1 tablet (81 mg total) by mouth daily. 30 tablet 1  . carvedilol (COREG) 6.25 MG tablet Take 1 tablet (6.25 mg total) by mouth 2 (two) times daily with a meal. 180 tablet 1  . furosemide (LASIX) 80 MG tablet Take 1 tablet (80 mg total) by mouth daily. 90 tablet 2  . HYDROcodone-acetaminophen (NORCO) 10-325 MG tablet Take 1 tablet by mouth every 6 (six) hours as  needed. 100 tablet 0  . lidocaine (XYLOCAINE) 5 % ointment Apply 1 application topically 2 (two) times daily as needed. 150 g 1  . lisinopril (PRINIVIL,ZESTRIL) 10 MG tablet Take 1 tablet (10 mg total) by mouth daily. 90 tablet 3  . pantoprazole (PROTONIX) 40 MG tablet TAKE 1 TABLET BY MOUTH 30 MINUTES BEFORE LUNCH OR DINNER 90 tablet 3  . polyethylene glycol (MIRALAX / GLYCOLAX) packet Take 17 g by mouth daily.    . rosuvastatin (CRESTOR) 5 MG tablet TAKE ONE TABLET BY MOUTH AT BEDTIME 90 tablet 3  .  warfarin (COUMADIN) 5 MG tablet Take one tablet by mouth once daily except on Mondays & Thursdays- take one-half tablet on Mondays & Thursdays 78 tablet 2   No facility-administered medications prior to visit.     No Known Allergies  Family History  Problem Relation Age of Onset  . Diabetes Father   . Diabetes Sister   . Colon polyps Sister   . Diabetes Mother   . Ovarian cancer Mother   . Deep vein thrombosis Sister   . Bone cancer Maternal Grandmother   . Breast cancer Neg Hx     Social History   Tobacco Use  . Smoking status: Former Research scientist (life sciences)  . Smokeless tobacco: Never Used  . Tobacco comment: quit 12 years ago  Substance Use Topics  . Alcohol use: Yes    Alcohol/week: 3.6 oz    Types: 6 Cans of beer per week    Comment: Beer sometimes.  . Drug use: No    Comment: smoked joint 2 weeks ago    ROS: As per history of present illness, otherwise negative  BP 132/80   Pulse 72   Ht 5\' 7"  (1.702 m)   Wt (!) 314 lb 6 oz (142.6 kg)   BMI 49.24 kg/m  Constitutional: Well-developed and well-nourished. No distress. HEENT: Normocephalic and atraumatic.  Conjunctivae are normal.  No scleral icterus. Neck: Neck supple. Trachea midline. Cardiovascular: Normal rate, regular rhythm and intact distal pulses.  Pulmonary/chest: Effort normal and breath sounds normal. No wheezing, rales or rhonchi. Abdominal: Soft, obese, nontender, nondistended. Bowel sounds active throughout Extremities: no clubbing, cyanosis, or edema Neurological: Alert and oriented to person place and time. Skin: Skin is warm and dry.  Psychiatric: Normal mood and affect. Behavior is normal.  RELEVANT LABS AND IMAGING: CBC    Component Value Date/Time   WBC 7.5 08/25/2017 1131   WBC 6.5 12/20/2012 1656   RBC 4.54 08/25/2017 1131   RBC 4.87 12/20/2012 1656   HGB 12.5 08/25/2017 1131   HCT 39.0 08/25/2017 1131   PLT 329 08/25/2017 1131   MCV 86 08/25/2017 1131   MCH 27.5 08/25/2017 1131   MCH 26.5  12/20/2012 1656   MCHC 32.1 08/25/2017 1131   MCHC 33.1 12/20/2012 1656   RDW 15.4 08/25/2017 1131   LYMPHSABS 0.6 (L) 09/27/2012 1704   MONOABS 0.4 09/27/2012 1704   EOSABS 0.0 09/27/2012 1704   BASOSABS 0.0 09/27/2012 1704    CMP     Component Value Date/Time   NA 145 (H) 07/09/2017 1123   K 4.0 07/09/2017 1123   CL 105 07/09/2017 1123   CO2 23 07/09/2017 1123   GLUCOSE 127 (H) 07/09/2017 1123   GLUCOSE 141 (H) 10/30/2014 1406   BUN 18 07/09/2017 1123   CREATININE 0.82 07/09/2017 1123   CREATININE 0.77 10/30/2014 1406   CALCIUM 9.5 07/09/2017 1123   PROT 7.2 07/09/2017 1123   ALBUMIN 4.1 07/09/2017  1123   AST 17 07/09/2017 1123   ALT 13 07/09/2017 1123   ALKPHOS 66 07/09/2017 1123   BILITOT 0.3 07/09/2017 1123   GFRNONAA 79 07/09/2017 1123   GFRNONAA 87 10/30/2014 1406   GFRAA 91 07/09/2017 1123   GFRAA >89 10/30/2014 1406    ASSESSMENT/PLAN: 57 year old female with a past medical history of DVT/PE on warfarin, CAD, CHF with normal EF, sleep apnea, obesity who is seen in consultation at the request of Dr. Maricela Bo to evaluate bright red blood per rectum.  1. BRBPR/family hx of colon polyps --she had a normal colonoscopy performed nearly 8 years ago.  Her bleeding could have been hemorrhoidal in the setting of constipation however with this bleeding, the length of time since her last colonoscopy, and her significant family history of colon polyps and for first-degree relatives I have recommended we repeat colonoscopy.  I discussed the risks, benefits and alternatives with her and she is agreeable and wishes to proceed.  We will contact her anticoagulation provider to determine if okay to hold warfarin.  Will defer the decision regarding Lovenox bridge to her prescribing provider.  Will hold warfarin 5 days prior to endoscopic procedures - will instruct when and how to resume after procedure. Benefits and risks of procedure explained including risks of bleeding, perforation,  infection, missed lesions, reactions to medications and possible need for hospitalization and surgery for complications. Additional rare but real risk of stroke or other vascular clotting events off warfarin also explained and need to seek urgent help if any signs of these problems occur. Will communicate by phone or EMR with patient's  prescribing provider to confirm that holding warfarin is reasonable in this case.    GD:JMEQAS, [place], [place] [address], [place] [id number]

## 2017-09-03 NOTE — Telephone Encounter (Signed)
Pt takes warfarin for recurrent DVT and PE. Other risk factors include morbid obesity with wt > 300 lbs. She will require Lovenox bridging in order to hold warfarin for 5 days prior to her procedure.  INR is followed in internal medicine clinic, will route clearance note to their PharmDs to coordinate Lovenox bridging.

## 2017-09-03 NOTE — Patient Instructions (Signed)

## 2017-09-04 ENCOUNTER — Encounter: Payer: Self-pay | Admitting: Pharmacist

## 2017-09-04 NOTE — Progress Notes (Addendum)
Hi Ms Bunning, we received notification of your upcoming colonoscopy procedure from Tinnie Gens with [facility]. During your upcoming appointment in Internal Medicine Cou[facility]in Clinic (09/14/17 at 10:15am) we will discuss plans for warfarin management before and after the procedure. Please contact the clinic if you have any questions. [phone].  Thank you  Paulla Dolly and Mannie Stabile

## 2017-09-04 NOTE — Telephone Encounter (Addendum)
Thank you! Patient has upcoming appointments in [place] clinic during which we can coordinate care. Called patient and left a message.

## 2017-09-08 LAB — TB SKIN TEST
Induration: 0 mm
TB Skin Test: NEGATIVE

## 2017-09-09 DIAGNOSIS — Z23 Encounter for immunization: Secondary | ICD-10-CM

## 2017-09-10 ENCOUNTER — Other Ambulatory Visit: Payer: Self-pay | Admitting: *Deleted

## 2017-09-10 MED ORDER — LIDOCAINE 5 % EX OINT: 1.0000 "application " | TOPICAL_OINTMENT | Freq: Two times a day (BID) | CUTANEOUS | 1 refills | Status: DC | PRN

## 2017-09-14 ENCOUNTER — Ambulatory Visit: Payer: Medicare Other

## 2017-09-21 ENCOUNTER — Ambulatory Visit (INDEPENDENT_AMBULATORY_CARE_PROVIDER_SITE_OTHER): Payer: Medicare Other | Admitting: Pharmacist

## 2017-09-21 DIAGNOSIS — Z86711 Personal history of pulmonary embolism: Secondary | ICD-10-CM

## 2017-09-21 DIAGNOSIS — Z5181 Encounter for therapeutic drug level monitoring: Secondary | ICD-10-CM | POA: Diagnosis not present

## 2017-09-21 DIAGNOSIS — I82533 Chronic embolism and thrombosis of popliteal vein, bilateral: Secondary | ICD-10-CM | POA: Diagnosis not present

## 2017-09-21 DIAGNOSIS — Z7901 Long term (current) use of anticoagulants: Secondary | ICD-10-CM

## 2017-09-21 LAB — POCT INR: INR: 1.3

## 2017-09-21 NOTE — Patient Instructions (Signed)
Patient instructed to take medications as defined in the Anti-coagulation Track section of this encounter.  Patient instructed to take today's dose.  Patient instructed to take  one (1) tablet of your 5mg  peach-colored warfarin tablets on Monday, Wednesday and Fridays of each week; all other days, take only 1/2 tablet. Patient instructed to [place] on March 22nd in preparation for your planned colonoscopy to be performed on March 27th, 2019. Patient verbalized understanding of these instructions.

## 2017-09-21 NOTE — Progress Notes (Signed)
Anticoagulation Management Breanna Wilkins is a 57 y.o. female who reports to the clinic for monitoring of warfarin treatment.    Indication: PE, history of ([place]) (Resolved) [126.99]; Chronic deep vein thrombosis (DVT) of both popliteal veins (HCC) [182.533]; long term current use of anticoagulant.  Duration: indefinite Supervising physician: Aldine Contes  Anticoagulation Clinic Visit History: Patient does not report signs/symptoms of bleeding or thromboembolism  Other recent changes: No diet, medications, lifestyle changes endorsed other than as noted in patient findings.  Anticoagulation Episode Summary    Current INR goal:   2.5-3.5  TTR:   57.6 % (6.4 y)  Next INR check:   10/12/2017  INR from last check:   1.30! (09/21/2017)  Weekly max warfarin dose:     Target end date:     [facility] check [facility]   [facility]  Preferred lab:     Send INR reminders to:      Indications   Pulmonary embolus (HCC) (Resolved) [I26.99] Chronic deep vein thrombosis (DVT) of both popliteal veins (HCC) [I82.533] Chronic pulmonary embolism (HCC) [I27.82] Long term (current) use of anticoagulants [Z79.01]       Comments:           No Known Allergies Prior to Admission medications   Medication Sig Start Date End Date Taking? Authorizing Provider  aspirin 81 MG chewable tablet Chew 1 tablet (81 mg total) by mouth daily. 10/08/12  Yes Hosie Poisson, MD  carvedilol (COREG) 6.25 MG tablet Take 1 tablet (6.25 mg total) by mouth 2 (two) times daily with a meal. 05/04/17  Yes Lucious Groves, DO  furosemide (LASIX) 80 MG tablet Take 1 tablet (80 mg total) by mouth daily. 11/12/16  Yes Lucious Groves, DO  HYDROcodone-acetaminophen (NORCO) 10-325 MG tablet Take 1 tablet by mouth every 6 (six) hours as needed. 07/09/17  Yes Lucious Groves, DO  lidocaine (XYLOCAINE) 5 % ointment Apply 1 application topically 2 (two) times daily as needed. 09/10/17  Yes Lucious Groves, DO  lisinopril (PRINIVIL,ZESTRIL) 10  MG tablet Take 1 tablet (10 mg total) by mouth daily. 04/21/17 04/21/18 Yes Hoffman, Erik C, DO  Na Sulfate-K Sulfate-Mg Sulf (SUPREP BOWEL PREP KIT) 17.5-3.13-1.6 GM/177ML SOLN Take 1 kit by mouth as directed. 09/03/17  Yes Pyrtle, Lajuan Lines, MD  pantoprazole (PROTONIX) 40 MG tablet TAKE 1 TABLET BY MOUTH 30 MINUTES BEFORE LUNCH OR DINNER 04/21/17  Yes Joni Reining C, DO  polyethylene glycol (MIRALAX / GLYCOLAX) packet Take 17 g by mouth daily.   Yes [provider]  rosuvastatin (CRESTOR) 5 MG tablet TAKE ONE TABLET BY MOUTH AT BEDTIME 05/29/17  Yes Lucious Groves, DO  warfarin (COUMADIN) 5 MG tablet Take one tablet by mouth once daily except on Mondays & Thursdays- take one-half tablet on Mondays & Thursdays 02/04/17  Yes Bartholomew Crews, MD   Past Medical History:  Diagnosis Date  . Abdominal hernia   . CAD (coronary artery disease)    Perioperative non-STEMI March, 2014  //   cardiac catheterization at time of non-STEMI, March, 2014, minor nonobstructive coronary disease, vigorous LV function, possibly vasospastic event versus stress cardiomyopathy. No further workup of coronary disease  . CHF (congestive heart failure) ([place])   . Ejection fraction    65-70%, vigorous function, echo, March, 2011  . Ejection fraction    Vigorous function at time of catheterization October 06, 2012,  . Hypertension   . Lung nodule 12/2009   Very small, left upper lobe by  CT June, 2011, one-year followup was stable  . OSA (obstructive sleep apnea)    CPAP  . Overweight(278.02)   . Pulmonary embolus ([place]) 01/16/10   on Coumadin indefinitely  . Shortness of breath   . Shoulder pain   . Warfarin anticoagulation    Social History   Socioeconomic History  . Marital status: Single    Spouse name: Not on file  . Number of children: Not on file  . Years of education: Not on file  . Highest education level: Not on file  Social Needs  . Financial resource strain: Not on file  . Food insecurity -  worry: Not on file  . Food insecurity - inability: Not on file  . Transportation needs - medical: Not on file  . Transportation needs - non-medical: Not on file  Occupational History  . Not on file  Tobacco Use  . Smoking status: Former Research scientist (life sciences)  . Smokeless tobacco: Never Used  . Tobacco comment: quit 12 years ago  Substance and Sexual Activity  . Alcohol use: Yes    Alcohol/week: 3.6 oz    Types: 6 Cans of beer per week    Comment: Beer sometimes.  . Drug use: No    Comment: smoked joint 2 weeks ago  . Sexual activity: Not on file  Other Topics Concern  . Not on file  Social History Narrative   Financial assistance approved for 100% discount at [facility] and has [facility] card per Bonna Gains   12/25/2009   Family History  Problem Relation Age of Onset  . Diabetes Father   . Diabetes Sister   . Colon polyps Sister   . Diabetes Mother   . Ovarian cancer Mother   . Deep vein thrombosis Sister   . Bone cancer Maternal Grandmother   . Breast cancer Neg Hx     ASSESSMENT Recent Results: The most recent result is correlated with 22.5 mg per week: Lab Results  Component Value Date   INR 1.30 09/21/2017   INR 3.5 08/31/2017   INR 3.5 08/31/2017    Anticoagulation Dosing: Description   Take one (1) tablet of your 61m peach-colored warfarin tablets on Monday, Wednesday and Fridays of each week; all other days, take only 1/2 tablet.      INR today: Subtherapeutic  PLAN Weekly dose was increased by 11% to 25 mg per week  Patient Instructions  Patient instructed to take medications as defined in the Anti-coagulation Track section of this encounter.  Patient instructed to take today's dose.  Patient instructed to take  one (1) tablet of your 568mpeach-colored warfarin tablets on Monday, Wednesday and Fridays of each week; all other days, take only 1/2 tablet. [place] in preparation for your planned colonoscopy to be performed on  March 27th, 2019. Patient verbalized understanding of these instructions.     Patient advised to contact clinic or seek medical attention if signs/symptoms of bleeding or thromboembolism occur.  Patient verbalized understanding by repeating back information and was advised to contact me if further medication-related questions arise. Patient was also provided an information handout.  Follow-up Return in 3 weeks (on 10/12/2017) for Follow up INR at 1015h.  JaPennie BanterPharmD, CACP, CPP  15 minutes spent face-to-face with the patient during the encounter. 50% of time spent on education. 50% of time was spent on fingerstick point of care INR sample collection, processing, results determination, dose adjustment and documentation in [email or web address]

## 2017-09-22 NOTE — Progress Notes (Signed)
INTERNAL MEDICINE TEACHING ATTENDING ADDENDUM - Aldine Contes M.D  Duration-indefinite, Indication-DVT, PE, INR-subtherapeutic. Agree with pharmacy recommendations as outlined in their note.

## 2017-09-28 ENCOUNTER — Ambulatory Visit (INDEPENDENT_AMBULATORY_CARE_PROVIDER_SITE_OTHER): Payer: Medicare Other | Admitting: Pharmacy Technician

## 2017-09-28 DIAGNOSIS — I2782 Chronic pulmonary embolism: Secondary | ICD-10-CM

## 2017-09-28 DIAGNOSIS — Z5181 Encounter for therapeutic drug level monitoring: Secondary | ICD-10-CM | POA: Diagnosis not present

## 2017-09-28 DIAGNOSIS — Z7901 Long term (current) use of anticoagulants: Secondary | ICD-10-CM | POA: Diagnosis not present

## 2017-09-28 DIAGNOSIS — I82533 Chronic embolism and thrombosis of popliteal vein, bilateral: Secondary | ICD-10-CM | POA: Diagnosis not present

## 2017-09-28 LAB — POCT INR: INR: 1.9

## 2017-09-28 NOTE — Patient Instructions (Signed)
Patient instructed to take medications as defined in the Anti-coagulation Track section of this encounter.  Patient instructed to take today's dose.  Patient instructed to take one (1) tablet of your 5mg  peach-colored warfarin tablets on Monday, Wednesday, Fridays, and Saturdays of each week; all other days, take only 1/2 tablet.  Patient instructed to STOP taking warfarin on 10/09/17, 5 days prior to colonoscopy on 10/14/17. Patient verbalized understanding of these instructions.

## 2017-09-28 NOTE — Progress Notes (Signed)
INTERNAL MEDICINE TEACHING ATTENDING ADDENDUM  I agree with pharmacy recommendations as outlined in their note.   Alexander N Raines, MD  

## 2017-09-28 NOTE — Progress Notes (Signed)
Anticoagulation Management Breanna Wilkins is a 57 y.o. female who reports to the clinic for monitoring of warfarin treatment.    Indication: Chronic DVT, PE Duration: indefinite Supervising physician: Lenice Pressman  Anticoagulation Clinic Visit History: Patient does not report signs/symptoms of bleeding or thromboembolism. Other recent changes: No diet, medications, or lifestyle changes reported at this time. Anticoagulation Episode Summary    Current INR goal:   2.5-3.5  TTR:   57.5 % (6.5 y)  Next INR check:   10/12/2017  INR from last check:   1.9! (09/28/2017)  Weekly max warfarin dose:     Target end date:     [facility]  Preferred lab:     Send INR reminders to:      Indications   Pulmonary embolus (HCC) (Resolved) [I26.99] Chronic deep vein thrombosis (DVT) of both popliteal veins (HCC) [I82.533] Chronic pulmonary embolism (HCC) [I27.82] Long term (current) use of anticoagulants [Z79.01]       Comments:           No Known Allergies Prior to Admission medications   Medication Sig Start Date End Date Taking? Authorizing Provider  aspirin 81 MG chewable tablet Chew 1 tablet (81 mg total) by mouth daily. 10/08/12  Yes Hosie Poisson, MD  carvedilol (COREG) 6.25 MG tablet Take 1 tablet (6.25 mg total) by mouth 2 (two) times daily with a meal. 05/04/17  Yes Lucious Groves, DO  furosemide (LASIX) 80 MG tablet Take 1 tablet (80 mg total) by mouth daily. 11/12/16  Yes Lucious Groves, DO  HYDROcodone-acetaminophen (NORCO) 10-325 MG tablet Take 1 tablet by mouth every 6 (six) hours as needed. 07/09/17  Yes Lucious Groves, DO  lidocaine (XYLOCAINE) 5 % ointment Apply 1 application topically 2 (two) times daily as needed. 09/10/17  Yes Lucious Groves, DO  lisinopril (PRINIVIL,ZESTRIL) 10 MG tablet Take 1 tablet (10 mg total) by mouth daily. 04/21/17 04/21/18 Yes Hoffman, Erik C, DO  Na Sulfate-K Sulfate-Mg Sulf (SUPREP BOWEL PREP KIT) 17.5-3.13-1.6  GM/177ML SOLN Take 1 kit by mouth as directed. 09/03/17  Yes Pyrtle, Lajuan Lines, MD  pantoprazole (PROTONIX) 40 MG tablet TAKE 1 TABLET BY MOUTH 30 MINUTES BEFORE LUNCH OR DINNER 04/21/17  Yes Joni Reining C, DO  polyethylene glycol (MIRALAX / GLYCOLAX) packet Take 17 g by mouth daily.   Yes [provider]  rosuvastatin (CRESTOR) 5 MG tablet TAKE ONE TABLET BY MOUTH AT BEDTIME 05/29/17  Yes Lucious Groves, DO  warfarin (COUMADIN) 5 MG tablet Take one tablet by mouth once daily except on Mondays & Thursdays- take one-half tablet on Mondays & Thursdays 02/04/17  Yes Bartholomew Crews, MD   Past Medical History:  Diagnosis Date  . Abdominal hernia   . CAD (coronary artery disease)    Perioperative non-STEMI March, 2014  //   cardiac catheterization at time of non-STEMI, March, 2014, minor nonobstructive coronary disease, vigorous LV function, possibly vasospastic event versus stress cardiomyopathy. No further workup of coronary disease  . CHF (congestive heart failure) ([place])   . Ejection fraction    65-70%, vigorous function, echo, March, 2011  . Ejection fraction    Vigorous function at time of catheterization October 06, 2012,  . Hypertension   . Lung nodule 12/2009   Very small, left upper lobe by CT June, 2011, one-year followup was stable  . OSA (obstructive sleep apnea)    CPAP  . Overweight(278.02)   . Pulmonary embolus ([place])  01/16/10   on Coumadin indefinitely  . Shortness of breath   . Shoulder pain   . Warfarin anticoagulation    Social History   Socioeconomic History  . Marital status: Single    Spouse name: Not on file  . Number of children: Not on file  . Years of education: Not on file  . Highest education level: Not on file  Social Needs  . Financial resource strain: Not on file  . Food insecurity - worry: Not on file  . Food insecurity - inability: Not on file  . Transportation needs - medical: Not on file  . Transportation needs - non-medical: Not on file   Occupational History  . Not on file  Tobacco Use  . Smoking status: Former Research scientist (life sciences)  . Smokeless tobacco: Never Used  . Tobacco comment: quit 12 years ago  Substance and Sexual Activity  . Alcohol use: Yes    Alcohol/week: 3.6 oz    Types: 6 Cans of beer per week    Comment: Beer sometimes.  . Drug use: No    Comment: smoked joint 2 weeks ago  . Sexual activity: Not on file  Other Topics Concern  . Not on file  Social History Narrative   Financial assistance approved for 100% discount at [facility] and has [facility] card per Bonna Gains   12/25/2009   Family History  Problem Relation Age of Onset  . Diabetes Father   . Diabetes Sister   . Colon polyps Sister   . Diabetes Mother   . Ovarian cancer Mother   . Deep vein thrombosis Sister   . Bone cancer Maternal Grandmother   . Breast cancer Neg Hx     ASSESSMENT Recent Results: The most recent result is correlated with 25 mg per week: Lab Results  Component Value Date   INR 1.9 09/28/2017   INR 1.30 09/21/2017   INR 3.5 08/31/2017    Anticoagulation Dosing: Description   Take one (1) tablet of your 18m peach-colored warfarin tablets on Monday, Wednesday, Fridays, and Saturdays of each week; all other days, take only 1/2 tablet.      INR today: Subtherapeutic  PLAN Weekly dose was increased by 10% to 27.5 mg per week  Patient Instructions  Patient instructed to take medications as defined in the Anti-coagulation Track section of this encounter.  Patient instructed to take today's dose.  Patient instructed to take one (1) tablet of your 570mpeach-colored warfarin tablets on Monday, Wednesday, Fridays, and Saturdays of each week; all other days, take only 1/2 tablet.  Patient instructed to STOP taking warfarin on 10/09/17, 5 days prior to colonoscopy on 10/14/17. Patient verbalized understanding of these instructions.     Patient advised to contact clinic or seek medical attention if signs/symptoms of bleeding or  thromboembolism occur.  Patient verbalized understanding by repeating back information and was advised to contact me if further medication-related questions arise. Patient was also provided an information handout.  Follow-up Return in about 2 weeks (around 10/12/2017) for INR follow up at 1030.  JoBertis RuddyPharmD Pharmacy Resident Pager #: [phone]/05/2018 10:43 AM  15 minutes spent face-to-face with the patient during the encounter. 50% of time spent on education. 50% of time was spent on INR point of care testing, analysis, and documentation in EMR.

## 2017-09-30 ENCOUNTER — Encounter: Payer: Self-pay | Admitting: Internal Medicine

## 2017-10-05 ENCOUNTER — Ambulatory Visit (INDEPENDENT_AMBULATORY_CARE_PROVIDER_SITE_OTHER): Payer: Medicare Other | Admitting: Pharmacist

## 2017-10-05 DIAGNOSIS — I2782 Chronic pulmonary embolism: Secondary | ICD-10-CM | POA: Diagnosis not present

## 2017-10-05 DIAGNOSIS — I2609 Other pulmonary embolism with acute cor pulmonale: Secondary | ICD-10-CM

## 2017-10-05 DIAGNOSIS — I82533 Chronic embolism and thrombosis of popliteal vein, bilateral: Secondary | ICD-10-CM

## 2017-10-05 DIAGNOSIS — Z7901 Long term (current) use of anticoagulants: Secondary | ICD-10-CM | POA: Diagnosis not present

## 2017-10-05 DIAGNOSIS — Z5181 Encounter for therapeutic drug level monitoring: Secondary | ICD-10-CM

## 2017-10-05 LAB — POCT INR: INR: 2.5

## 2017-10-05 NOTE — Patient Instructions (Signed)
Patient instructed to take medications as defined in the Anti-coagulation Track section of this encounter.  Patient instructed to take today's dose.  Patient instructed to take one (1) tablet of warfarin on Mondays, Wednesdays, Fridays and Saturdays; all other days, take only 1/2 tablet. You will DISCONTINUE WARFARIN commencing 22-MAR-19 and will omit doses for FIVE DAYS. AFTER your colonoscopy, if your GI Medicine Physician agrees, you will resume warfarin that night. Return to clinic for INR on 3-APR-19 *(or before if you were to see blood in your stool). Patient verbalized understanding of these instructions.

## 2017-10-05 NOTE — Progress Notes (Signed)
Anticoagulation Management Breanna Wilkins is a 57 y.o. female who reports to the clinic for monitoring of warfarin treatment.    Indication: PE ([place]) (Resolved) [126.99], Chronic deep vein thrombosis (DVT) of both popliteal veins; chronic anticoagulation.  Duration: 1 year Supervising physician: Aldine Contes  Anticoagulation Clinic Visit History: Patient does not report signs/symptoms of bleeding or thromboembolism  Other recent changes: No diet, medications, lifestyle changes except as noted in patient findings.  Anticoagulation Episode Summary    Current INR goal:   2.5-3.5  TTR:   57.3 % (6.5 y)  Next INR check:   10/21/2017  INR from last check:   2.50 (10/05/2017)  Weekly max warfarin dose:     Target end date:     [facility]  Preferred lab:     Send INR reminders to:      Indications   Pulmonary embolus (HCC) (Resolved) [I26.99] Chronic deep vein thrombosis (DVT) of both popliteal veins (HCC) [I82.533] Chronic pulmonary embolism (HCC) [I27.82] Long term (current) use of anticoagulants [Z79.01]       Comments:           No Known Allergies Prior to Admission medications   Medication Sig Start Date End Date Taking? Authorizing Provider  aspirin 81 MG chewable tablet Chew 1 tablet (81 mg total) by mouth daily. 10/08/12  Yes Hosie Poisson, MD  carvedilol (COREG) 6.25 MG tablet Take 1 tablet (6.25 mg total) by mouth 2 (two) times daily with a meal. 05/04/17  Yes Lucious Groves, DO  furosemide (LASIX) 80 MG tablet Take 1 tablet (80 mg total) by mouth daily. 11/12/16  Yes Lucious Groves, DO  HYDROcodone-acetaminophen (NORCO) 10-325 MG tablet Take 1 tablet by mouth every 6 (six) hours as needed. 07/09/17  Yes Lucious Groves, DO  lidocaine (XYLOCAINE) 5 % ointment Apply 1 application topically 2 (two) times daily as needed. 09/10/17  Yes Lucious Groves, DO  lisinopril (PRINIVIL,ZESTRIL) 10 MG tablet Take 1 tablet (10 mg total) by mouth daily.  04/21/17 04/21/18 Yes Hoffman, Erik C, DO  Na Sulfate-K Sulfate-Mg Sulf (SUPREP BOWEL PREP KIT) 17.5-3.13-1.6 GM/177ML SOLN Take 1 kit by mouth as directed. 09/03/17  Yes Pyrtle, Lajuan Lines, MD  pantoprazole (PROTONIX) 40 MG tablet TAKE 1 TABLET BY MOUTH 30 MINUTES BEFORE LUNCH OR DINNER 04/21/17  Yes Joni Reining C, DO  polyethylene glycol (MIRALAX / GLYCOLAX) packet Take 17 g by mouth daily.   Yes [provider]  rosuvastatin (CRESTOR) 5 MG tablet TAKE ONE TABLET BY MOUTH AT BEDTIME 05/29/17  Yes Lucious Groves, DO  warfarin (COUMADIN) 5 MG tablet Take one tablet by mouth once daily except on Mondays & Thursdays- take one-half tablet on Mondays & Thursdays 02/04/17  Yes Bartholomew Crews, MD   Past Medical History:  Diagnosis Date  . Abdominal hernia   . CAD (coronary artery disease)    Perioperative non-STEMI March, 2014  //   cardiac catheterization at time of non-STEMI, March, 2014, minor nonobstructive coronary disease, vigorous LV function, possibly vasospastic event versus stress cardiomyopathy. No further workup of coronary disease  . CHF (congestive heart failure) ([place])   . Ejection fraction    65-70%, vigorous function, echo, March, 2011  . Ejection fraction    Vigorous function at time of catheterization October 06, 2012,  . Hypertension   . Lung nodule 12/2009   Very small, left upper lobe by CT June, 2011, one-year followup was stable  .  OSA (obstructive sleep apnea)    CPAP  . Overweight(278.02)   . Pulmonary embolus ([place]) 01/16/10   on Coumadin indefinitely  . Shortness of breath   . Shoulder pain   . Warfarin anticoagulation    Social History   Socioeconomic History  . Marital status: Single    Spouse name: Not on file  . Number of children: Not on file  . Years of education: Not on file  . Highest education level: Not on file  Social Needs  . Financial resource strain: Not on file  . Food insecurity - worry: Not on file  . Food insecurity - inability: Not on  file  . Transportation needs - medical: Not on file  . Transportation needs - non-medical: Not on file  Occupational History  . Not on file  Tobacco Use  . Smoking status: Former Research scientist (life sciences)  . Smokeless tobacco: Never Used  . Tobacco comment: quit 12 years ago  Substance and Sexual Activity  . Alcohol use: Yes    Alcohol/week: 3.6 oz    Types: 6 Cans of beer per week    Comment: Beer sometimes.  . Drug use: No    Comment: smoked joint 2 weeks ago  . Sexual activity: Not on file  Other Topics Concern  . Not on file  Social History Narrative   Financial assistance approved for 100% discount at [facility] and has [facility] card per Bonna Gains   12/25/2009   Family History  Problem Relation Age of Onset  . Diabetes Father   . Diabetes Sister   . Colon polyps Sister   . Diabetes Mother   . Ovarian cancer Mother   . Deep vein thrombosis Sister   . Bone cancer Maternal Grandmother   . Breast cancer Neg Hx     ASSESSMENT Recent Results: The most recent result is correlated with 27.5 mg per week: Lab Results  Component Value Date   INR 2.50 10/05/2017   INR 1.9 09/28/2017   INR 1.30 09/21/2017    Anticoagulation Dosing: Description   Take one (1) tablet of warfarin on Mondays, Wednesdays, Fridays and Saturdays; all other days, take only 1/2 tablet. You will DISCONTINUE WARFARIN commencing 22-MAR-19 and will omit doses for FIVE DAYS. AFTER your colonoscopy, if your GI Medicine Physician agrees, you will resume warfarin that night. Return to clinic for INR on 3-APR-19 *(or before if you were to see blood in your stool).      INR today: Therapeutic  PLAN Weekly dose was unchanged. She was provided instructions for "HOLD" of warfarin for 5 days prior to colonoscopy.   Patient Instructions  Patient instructed to take medications as defined in the Anti-coagulation Track section of this encounter.  Patient instructed to take today's dose.  Patient instructed to take one (1) tablet of  warfarin on Mondays, Wednesdays, Fridays and Saturdays; all other days, take only 1/2 tablet. You will DISCONTINUE WARFARIN commencing 22-MAR-19 and will omit doses for FIVE DAYS. AFTER your colonoscopy, if your GI Medicine Physician agrees, you will resume warfarin that night. Return to clinic for INR on 3-APR-19 *(or before if you were to see blood in your stool). Patient verbalized understanding of these instructions.     Patient advised to contact clinic or seek medical attention if signs/symptoms of bleeding or thromboembolism occur.  Patient verbalized understanding by repeating back information and was advised to contact me if further medication-related questions arise. Patient was also provided an information handout.  Follow-up Return in 2  weeks (on 10/21/2017) for Follow up INR at 1000h.  Pennie Banter, PharmD, CACP, CPP  15 minutes spent face-to-face with the patient during the encounter. 50% of time spent on education. 50% of time was spent on fingerstick point of care INR sample collection, processing, results determination, dose schedule around planned colonoscopy and documentation in [email or web address].

## 2017-10-05 NOTE — Progress Notes (Signed)
INTERNAL MEDICINE TEACHING ATTENDING ADDENDUM - Danylle Ouk M.D  Duration- indefinite, Indication- PE, DVT, INR- therapeutic. Agree with pharmacy recommendations as outlined in their note.      

## 2017-10-14 ENCOUNTER — Encounter: Payer: Self-pay | Admitting: Internal Medicine

## 2017-10-14 ENCOUNTER — Other Ambulatory Visit: Payer: Self-pay

## 2017-10-14 ENCOUNTER — Ambulatory Visit (AMBULATORY_SURGERY_CENTER): Payer: Medicare Other | Admitting: Internal Medicine

## 2017-10-14 VITALS — BP 137/76 | HR 69 | Temp 98.6°F | Resp 15 | Ht 67.0 in | Wt 314.0 lb

## 2017-10-14 DIAGNOSIS — D123 Benign neoplasm of transverse colon: Secondary | ICD-10-CM

## 2017-10-14 DIAGNOSIS — K625 Hemorrhage of anus and rectum: Secondary | ICD-10-CM | POA: Diagnosis present

## 2017-10-14 DIAGNOSIS — D122 Benign neoplasm of ascending colon: Secondary | ICD-10-CM | POA: Diagnosis not present

## 2017-10-14 MED ORDER — SODIUM CHLORIDE 0.9 % IV SOLN: 500.0000 mL | Freq: Once | INTRAVENOUS | Status: DC

## 2017-10-14 NOTE — Op Note (Signed)
[facility] Patient Name: Breanna Wilkins Procedure Date: 10/14/2017 12:35 PM MRN: [id number] Endoscopist: Jerene Bears , MD Age: 57 Referring MD:  Date of Birth: June 13, 1961 Gender: Female Account #: [id number] Procedure:                Colonoscopy Indications:              Rectal bleeding, Family history of colon polyps Medicines:                Monitored Anesthesia Care Procedure:                Pre-Anesthesia Assessment:                           - Prior to the procedure, a History and Physical                            was performed, and patient medications and                            allergies were reviewed. The patient's tolerance of                            previous anesthesia was also reviewed. The risks                            and benefits of the procedure and the sedation                            options and risks were discussed with the patient.                            All questions were answered, and informed consent                            was obtained. Prior Anticoagulants: The patient has                            taken Coumadin (warfarin), last dose was 5 days                            prior to procedure. ASA Grade Assessment: III - A                            patient with severe systemic disease. After                            reviewing the risks and benefits, the patient was                            deemed in satisfactory condition to undergo the                            procedure.  After obtaining informed consent, the colonoscope                            was passed under direct vision. Throughout the                            procedure, the patient's blood pressure, pulse, and                            oxygen saturations were monitored continuously. The                            Colonoscope was introduced through the anus and                            advanced to the the cecum, identified by                       appendiceal orifice and ileocecal valve. The                            colonoscopy was performed without difficulty. The                            patient tolerated the procedure well. The quality                            of the bowel preparation was good. The ileocecal                            valve, appendiceal orifice, and rectum were                            photographed. Scope In: 1:38:46 PM Scope Out: 1:57:40 PM Scope Withdrawal Time: 0 hours 15 minutes 41 seconds  Total Procedure Duration: 0 hours 18 minutes 54 seconds  Findings:                 The digital rectal exam was normal.                           A 6 mm polyp was found in the ascending colon. The                            polyp was sessile. The polyp was removed with a                            cold snare. Resection and retrieval were complete.                           A 12 mm polyp was found in the proximal transverse                            colon. The polyp was pedunculated. The polyp was  removed with a hot snare. Resection and retrieval                            were complete.                           Three sessile polyps were found in the transverse                            colon. The polyps were 3 to 6 mm in size. These                            polyps were removed with a cold snare. Resection                            and retrieval were complete.                           A few small-mouthed diverticula were found in the                            descending colon.                           Internal hemorrhoids were found during                            retroflexion. The hemorrhoids were small. Complications:            No immediate complications. Estimated Blood Loss:     Estimated blood loss was minimal. Impression:               - One 6 mm polyp in the ascending colon, removed                            with a cold snare. Resected and  retrieved.                           - One 12 mm polyp in the proximal transverse colon,                            removed with a hot snare. Resected and retrieved.                           - Three 3 to 6 mm polyps in the transverse colon,                            removed with a cold snare. Resected and retrieved.                           - Diverticulosis in the descending colon.                           - Small hemorrhoids. Recommendation:           -  Patient has a contact number available for                            emergencies. The signs and symptoms of potential                            delayed complications were discussed with the                            patient. Return to normal activities tomorrow.                            Written discharge instructions were provided to the                            patient.                           - Resume previous diet.                           - Continue present medications.                           - Await pathology results.                           - Repeat colonoscopy is recommended. The                            colonoscopy date will be determined after pathology                            results from today's exam become available for                            review. Jerene Bears, MD 10/14/2017 2:04:52 PM This report has been signed electronically.

## 2017-10-14 NOTE — Progress Notes (Signed)
To PACU, VSS. Report to [email or web address] 

## 2017-10-14 NOTE — Patient Instructions (Signed)
YOU HAD AN ENDOSCOPIC PROCEDURE TODAY AT [facility]:   Refer to the procedure report that was given to you for any specific questions about what was found during the examination.  If the procedure report does not answer your questions, please call your gastroenterologist to clarify.  If you requested that your care partner not be given the details of your procedure findings, then the procedure report has been included in a sealed envelope for you to review at your convenience later.  YOU SHOULD EXPECT: Some feelings of bloating in the abdomen. Passage of more gas than usual.  Walking can help get rid of the air that was put into your GI tract during the procedure and reduce the bloating. If you had a lower endoscopy (such as a colonoscopy or flexible sigmoidoscopy) you may notice spotting of blood in your stool or on the toilet paper. If you underwent a bowel prep for your procedure, you may not have a normal bowel movement for a few days.  Please Note:  You might notice some irritation and congestion in your nose or some drainage.  This is from the oxygen used during your procedure.  There is no need for concern and it should clear up in a day or so.  SYMPTOMS TO REPORT IMMEDIATELY:   Following lower endoscopy (colonoscopy or flexible sigmoidoscopy):  Excessive amounts of blood in the stool  Significant tenderness or worsening of abdominal pains  Swelling of the abdomen that is new, acute  Fever of 100F or higher   For urgent or emergent issues, a gastroenterologist can be reached at any hour by calling [phone].   DIET:  We do recommend a small meal at first, but then you may proceed to your regular diet.  Drink plenty of fluids but you should avoid alcoholic beverages for 24 hours.  ACTIVITY:  You should plan to take it easy for the rest of today and you should NOT DRIVE or use heavy machinery until tomorrow (because of the sedation medicines used during the test).     FOLLOW UP: Our staff will call the number listed on your records the next business day following your procedure to check on you and address any questions or concerns that you may have regarding the information given to you following your procedure. If we do not reach you, we will leave a message.  However, if you are feeling well and you are not experiencing any problems, there is no need to return our call.  We will assume that you have returned to your regular daily activities without incident.  If any biopsies were taken you will be contacted by phone or by letter within the next 1-3 weeks.  Please call us at [phone] if you have not heard about the biopsies in 3 weeks.    SIGNATURES/CONFIDENTIALITY: You and/or your care partner have signed paperwork which will be entered into your electronic medical record.  These signatures attest to the fact that that the information above on your After Visit Summary has been reviewed and is understood.  Full responsibility of the confidentiality of this discharge information lies with you and/or your care-partner.  Polyp, diverticulosis and hemorrhoid information given.  Resume Coumadin this evening.

## 2017-10-14 NOTE — Progress Notes (Signed)
Called to room to assist during endoscopic procedure.  Patient ID and intended procedure confirmed with present staff. Received instructions for my participation in the procedure from the performing physician.  

## 2017-10-15 ENCOUNTER — Telehealth: Payer: Self-pay

## 2017-10-15 NOTE — Telephone Encounter (Signed)
  Follow up Call-  Call back number 10/14/2017  Post procedure Call Back phone  # [phone]  Permission to leave phone message Yes  Some recent data might be hidden     Patient questions:  Do you have a fever, pain , or abdominal swelling? No. Pain Score  0 *  Have you tolerated food without any problems? Yes.    Have you been able to return to your normal activities? Yes.    Do you have any questions about your discharge instructions: Diet   No. Medications  No. Follow up visit  No.  Do you have questions or concerns about your Care? No.  Actions: * If pain score is 4 or above: No action needed, pain <4.

## 2017-10-17 ENCOUNTER — Encounter: Payer: Self-pay | Admitting: Internal Medicine

## 2017-10-17 DIAGNOSIS — Z860100 Personal history of colon polyps, unspecified: Secondary | ICD-10-CM | POA: Insufficient documentation

## 2017-10-17 DIAGNOSIS — K648 Other hemorrhoids: Secondary | ICD-10-CM

## 2017-10-17 DIAGNOSIS — K5730 Diverticulosis of large intestine without perforation or abscess without bleeding: Secondary | ICD-10-CM

## 2017-10-17 HISTORY — DX: Other hemorrhoids: K64.8

## 2017-10-17 HISTORY — DX: Diverticulosis of large intestine without perforation or abscess without bleeding: K57.30

## 2017-10-17 HISTORY — DX: Personal history of colon polyps, unspecified: Z86.0100

## 2017-10-17 HISTORY — DX: Personal history of colonic polyps: Z86.010

## 2017-10-20 ENCOUNTER — Encounter: Payer: Self-pay | Admitting: Internal Medicine

## 2017-10-21 ENCOUNTER — Ambulatory Visit: Payer: Medicare Other

## 2017-10-22 ENCOUNTER — Ambulatory Visit: Payer: Medicare Other

## 2017-10-22 ENCOUNTER — Other Ambulatory Visit: Payer: Self-pay

## 2017-10-22 NOTE — Telephone Encounter (Signed)
HYDROcodone-acetaminophen (NORCO) 10-325 MG tablet    Refill request @ [facility] in [place].

## 2017-10-26 ENCOUNTER — Telehealth: Payer: Self-pay | Admitting: Internal Medicine

## 2017-10-26 ENCOUNTER — Ambulatory Visit: Payer: Medicare Other

## 2017-10-26 MED ORDER — HYDROCODONE-ACETAMINOPHEN 10-325 MG PO TABS: 1.0000 | ORAL_TABLET | Freq: Four times a day (QID) | ORAL | 0 refills | Status: DC | PRN

## 2017-10-26 NOTE — Telephone Encounter (Signed)
Needs appointment with me

## 2017-10-26 NOTE — Telephone Encounter (Signed)
Patient notified that Rx was sent today. First available appt sched with PCP for 11/26/2017 at 1015. Hubbard Hartshorn, RN, BSN

## 2017-10-26 NOTE — Telephone Encounter (Signed)
Patient is checking on pain medicine

## 2017-10-27 NOTE — Telephone Encounter (Signed)
Thank you :)

## 2017-10-30 ENCOUNTER — Ambulatory Visit (INDEPENDENT_AMBULATORY_CARE_PROVIDER_SITE_OTHER): Payer: Medicare Other | Admitting: Pharmacist

## 2017-10-30 DIAGNOSIS — Z7901 Long term (current) use of anticoagulants: Secondary | ICD-10-CM | POA: Diagnosis not present

## 2017-10-30 DIAGNOSIS — I2609 Other pulmonary embolism with acute cor pulmonale: Secondary | ICD-10-CM

## 2017-10-30 DIAGNOSIS — Z5181 Encounter for therapeutic drug level monitoring: Secondary | ICD-10-CM

## 2017-10-30 DIAGNOSIS — I82533 Chronic embolism and thrombosis of popliteal vein, bilateral: Secondary | ICD-10-CM | POA: Diagnosis not present

## 2017-10-30 DIAGNOSIS — I2782 Chronic pulmonary embolism: Secondary | ICD-10-CM | POA: Diagnosis not present

## 2017-10-30 LAB — POCT INR: INR: 3.7

## 2017-10-30 NOTE — Patient Instructions (Signed)
Patient instructed to take medications as defined in the Anti-coagulation Track section of this encounter.  Patient instructed to take today's dose.  Patient verbalized understanding of these instructions.  Patient was instructed to take one (1) tablet of warfarin on Mondays, Wednesdays, Fridays and Saturdays; all other days, take only 1/2 tablet.

## 2017-10-30 NOTE — Progress Notes (Signed)
Anticoagulation Management Breanna Wilkins is a 56 y.o. female who reports to the clinic for monitoring of warfarin treatment.    Indication: Chronic DVT, history of PE, long term use of anticoagulants  Duration: indefinite Supervising physician: Lalla Brothers  Anticoagulation Clinic Visit History: Patient does not report signs/symptoms of bleeding or thromboembolism Other recent changes: Denies any diet, medications, lifestyle changes Anticoagulation Episode Summary    Current INR goal:   2.5-3.5  TTR:   57.6 % (6.5 y)  Next INR check:   11/26/2017  INR from last check:   3.7! (10/30/2017)  Weekly max warfarin dose:     Target end date:     INR check location:   Anticoagulation Clinic  Preferred lab:     Send INR reminders to:      Indications   Pulmonary embolus (HCC) (Resolved) [I26.99] Chronic deep vein thrombosis (DVT) of both popliteal veins (HCC) [I82.533] Chronic pulmonary embolism (HCC) [I27.82] Long term (current) use of anticoagulants [Z79.01]       Comments:           No Known Allergies Prior to Admission medications   Medication Sig Start Date End Date Taking? Authorizing Provider  aspirin 81 MG chewable tablet Chew 1 tablet (81 mg total) by mouth daily. 10/08/12   Hosie Poisson, MD  carvedilol (COREG) 6.25 MG tablet Take 1 tablet (6.25 mg total) by mouth 2 (two) times daily with a meal. 05/04/17   Lucious Groves, DO  furosemide (LASIX) 80 MG tablet Take 1 tablet (80 mg total) by mouth daily. 11/12/16   Lucious Groves, DO  HYDROcodone-acetaminophen (NORCO) 10-325 MG tablet Take 1 tablet by mouth every 6 (six) hours as needed. 10/26/17   Lucious Groves, DO  lidocaine (XYLOCAINE) 5 % ointment Apply 1 application topically 2 (two) times daily as needed. 09/10/17   Lucious Groves, DO  lisinopril (PRINIVIL,ZESTRIL) 10 MG tablet Take 1 tablet (10 mg total) by mouth daily. 04/21/17 04/21/18  Lucious Groves, DO  pantoprazole (PROTONIX) 40 MG tablet TAKE 1 TABLET BY MOUTH  30 MINUTES BEFORE LUNCH OR DINNER 04/21/17   Lucious Groves, DO  polyethylene glycol (MIRALAX / GLYCOLAX) packet Take 17 g by mouth daily.    [provider]  rosuvastatin (CRESTOR) 5 MG tablet TAKE ONE TABLET BY MOUTH AT BEDTIME 05/29/17   Lucious Groves, DO  warfarin (COUMADIN) 5 MG tablet Take one tablet by mouth once daily except on Mondays & Thursdays- take one-half tablet on Mondays & Thursdays 02/04/17   Bartholomew Crews, MD   Past Medical History:  Diagnosis Date  . Abdominal hernia   . Arthritis   . CAD (coronary artery disease)    Perioperative non-STEMI March, 2014  //   cardiac catheterization at time of non-STEMI, March, 2014, minor nonobstructive coronary disease, vigorous LV function, possibly vasospastic event versus stress cardiomyopathy. No further workup of coronary disease  . CHF (congestive heart failure) ([place])   . Diverticulosis of colon 10/17/2017   Noted on colonoscopy 10/14/17  . Ejection fraction    65-70%, vigorous function, echo, March, 2011  . Ejection fraction    Vigorous function at time of catheterization October 06, 2012,  . History of colonic polyps 10/17/2017   Colonoscopy 10/14/17  . Hyperlipidemia   . Hypertension   . Internal hemorrhoids without complication 0/93/8182   Noted on colonoscopy 10/14/17  . Lung nodule 12/2009   Very small, left upper lobe by CT June, 2011, one-year  followup was stable  . Myocardial infarction ([place]) 09/2013  . OSA (obstructive sleep apnea)    CPAP  . Overweight(278.02)   . Pulmonary embolus ([place]) 01/16/10   on Coumadin indefinitely  . Shortness of breath   . Shoulder pain   . Sleep apnea   . Warfarin anticoagulation    Social History   Socioeconomic History  . Marital status: Single    Spouse name: Not on file  . Number of children: Not on file  . Years of education: Not on file  . Highest education level: Not on file  Occupational History  . Not on file  Social Needs  . Financial resource strain:  Not on file  . Food insecurity:    Worry: Not on file    Inability: Not on file  . Transportation needs:    Medical: Not on file    Non-medical: Not on file  Tobacco Use  . Smoking status: Former Research scientist (life sciences)  . Smokeless tobacco: Never Used  . Tobacco comment: quit 12 years ago  Substance and Sexual Activity  . Alcohol use: Yes    Alcohol/week: 3.6 oz    Types: 6 Cans of beer per week    Comment: Beer sometimes.  . Drug use: Yes    Types: Marijuana    Comment: smoked joint 2 weeks ago  . Sexual activity: Not on file  Lifestyle  . Physical activity:    Days per week: Not on file    Minutes per session: Not on file  . Stress: Not on file  Relationships  . Social connections:    Talks on phone: Not on file    Gets together: Not on file    Attends religious service: Not on file    Active member of club or organization: Not on file    Attends meetings of clubs or organizations: Not on file    Relationship status: Not on file  Other Topics Concern  . Not on file  Social History Narrative   Financial assistance approved for 100% discount at [facility] and has [facility] card per Bonna Gains   12/25/2009   Family History  Problem Relation Age of Onset  . Diabetes Father   . Diabetes Sister   . Colon polyps Sister   . Diabetes Mother   . Ovarian cancer Mother   . Deep vein thrombosis Sister   . Bone cancer Maternal Grandmother   . Breast cancer Neg Hx   . Colon cancer Neg Hx   . Esophageal cancer Neg Hx   . Rectal cancer Neg Hx   . Stomach cancer Neg Hx     ASSESSMENT Recent Results: The most recent result is correlated with 27.5 mg per week: Lab Results  Component Value Date   INR 3.7 10/30/2017   INR 2.50 10/05/2017   INR 1.9 09/28/2017    Anticoagulation Dosing: Description   Take one (1) tablet of warfarin on Mondays, Wednesdays, Fridays and Saturdays; all other days, take only 1/2 tablet.      INR today: Supratherapeutic  PLAN Weekly dose was unchanged as patient is  only slightly supratherapeutic and this dose has worked well for her in the past. Will make adjustments should she return and have another supratherapeutic level.  Patient Instructions  Patient instructed to take medications as defined in the Anti-coagulation Track section of this encounter.  Patient instructed to take today's dose.  Patient verbalized understanding of these instructions.  Patient was instructed to take one (1) tablet  of warfarin on Mondays, Wednesdays, Fridays and Saturdays; all other days, take only 1/2 tablet.    Patient advised to contact clinic or seek medical attention if signs/symptoms of bleeding or thromboembolism occur.  Patient verbalized understanding by repeating back information and was advised to contact me if further medication-related questions arise. Patient was also provided an information handout.  Follow-up Return in about 1 month (around 11/26/2017) for INR f/u @1045 .  Patterson Hammersmith PharmD PGY1 Pharmacy Practice Resident 10/30/2017 10:02 AM Pager: [phone]  15 minutes spent face-to-face with the patient during the encounter. 50% of time spent on education. 50% of time was spent on collection of INR, interpretation, and documentation.

## 2017-11-23 ENCOUNTER — Other Ambulatory Visit: Payer: Self-pay | Admitting: *Deleted

## 2017-11-24 ENCOUNTER — Other Ambulatory Visit: Payer: Self-pay

## 2017-11-24 MED ORDER — HYDROCODONE-ACETAMINOPHEN 10-325 MG PO TABS: 1.0000 | ORAL_TABLET | Freq: Four times a day (QID) | ORAL | 0 refills | Status: DC | PRN

## 2017-11-24 MED ORDER — CARVEDILOL 6.25 MG PO TABS: 6.2500 mg | ORAL_TABLET | Freq: Two times a day (BID) | ORAL | 1 refills | Status: DC

## 2017-11-24 NOTE — Telephone Encounter (Signed)
HYDROcodone-acetaminophen (NORCO) 10-325 MG tablet   Refill request @ [facility] in [place].

## 2017-11-24 NOTE — Telephone Encounter (Signed)
Reviewed [facility], appropriate. Last fill on 4/8, will place start date of 5/8. Has appointment on 5/9

## 2017-11-26 ENCOUNTER — Ambulatory Visit (INDEPENDENT_AMBULATORY_CARE_PROVIDER_SITE_OTHER): Payer: Medicare Other | Admitting: Internal Medicine

## 2017-11-26 ENCOUNTER — Ambulatory Visit (INDEPENDENT_AMBULATORY_CARE_PROVIDER_SITE_OTHER): Payer: Medicare Other | Admitting: Pharmacist

## 2017-11-26 ENCOUNTER — Encounter: Payer: Self-pay | Admitting: Internal Medicine

## 2017-11-26 ENCOUNTER — Encounter: Payer: Medicare Other | Admitting: Internal Medicine

## 2017-11-26 ENCOUNTER — Other Ambulatory Visit: Payer: Self-pay

## 2017-11-26 VITALS — BP 156/85 | HR 73 | Temp 97.9°F | Ht 67.0 in | Wt 309.5 lb

## 2017-11-26 DIAGNOSIS — M25561 Pain in right knee: Secondary | ICD-10-CM | POA: Diagnosis not present

## 2017-11-26 DIAGNOSIS — M25512 Pain in left shoulder: Secondary | ICD-10-CM | POA: Diagnosis not present

## 2017-11-26 DIAGNOSIS — I10 Essential (primary) hypertension: Secondary | ICD-10-CM | POA: Diagnosis not present

## 2017-11-26 DIAGNOSIS — G8929 Other chronic pain: Secondary | ICD-10-CM

## 2017-11-26 DIAGNOSIS — K648 Other hemorrhoids: Secondary | ICD-10-CM

## 2017-11-26 DIAGNOSIS — Z7901 Long term (current) use of anticoagulants: Secondary | ICD-10-CM

## 2017-11-26 DIAGNOSIS — K635 Polyp of colon: Secondary | ICD-10-CM | POA: Diagnosis not present

## 2017-11-26 DIAGNOSIS — K5790 Diverticulosis of intestine, part unspecified, without perforation or abscess without bleeding: Secondary | ICD-10-CM | POA: Diagnosis not present

## 2017-11-26 DIAGNOSIS — Z79899 Other long term (current) drug therapy: Secondary | ICD-10-CM

## 2017-11-26 DIAGNOSIS — Z6841 Body Mass Index (BMI) 40.0 and over, adult: Secondary | ICD-10-CM | POA: Diagnosis not present

## 2017-11-26 DIAGNOSIS — M1711 Unilateral primary osteoarthritis, right knee: Secondary | ICD-10-CM

## 2017-11-26 DIAGNOSIS — G4733 Obstructive sleep apnea (adult) (pediatric): Secondary | ICD-10-CM

## 2017-11-26 DIAGNOSIS — K625 Hemorrhage of anus and rectum: Secondary | ICD-10-CM

## 2017-11-26 DIAGNOSIS — I82533 Chronic embolism and thrombosis of popliteal vein, bilateral: Secondary | ICD-10-CM

## 2017-11-26 DIAGNOSIS — Z79891 Long term (current) use of opiate analgesic: Secondary | ICD-10-CM | POA: Diagnosis not present

## 2017-11-26 DIAGNOSIS — I509 Heart failure, unspecified: Secondary | ICD-10-CM

## 2017-11-26 DIAGNOSIS — I2782 Chronic pulmonary embolism: Secondary | ICD-10-CM | POA: Diagnosis not present

## 2017-11-26 LAB — POCT INR: INR: 3.1

## 2017-11-26 MED ORDER — DICLOFENAC SODIUM 1 % TD GEL: 2.0000 g | Freq: Four times a day (QID) | TRANSDERMAL | 5 refills | Status: DC | PRN

## 2017-11-26 MED ORDER — FUROSEMIDE 40 MG PO TABS: 40.0000 mg | ORAL_TABLET | Freq: Every day | ORAL | 3 refills | Status: DC

## 2017-11-26 MED ORDER — LISINOPRIL 20 MG PO TABS: 20.0000 mg | ORAL_TABLET | Freq: Every day | ORAL | 3 refills | Status: DC

## 2017-11-26 NOTE — Progress Notes (Signed)
Reviewed & agree thx Karsten Fells

## 2017-11-26 NOTE — Addendum Note (Signed)
Addended by: Forde Dandy on: 11/26/2017 04:40 PM   Modules accepted: Level of Service

## 2017-11-26 NOTE — Progress Notes (Signed)
Anticoagulation Management Breanna Wilkins is a 57 y.o. female who reports to the clinic for monitoring of warfarin treatment.    Indication: DVT, PE and long term use of anticoagulants  Duration: indefinite Supervising physician: Murriel Hopper  Anticoagulation Clinic Visit History: Patient does report signs/symptoms of bleeding - bruising due to hitting her left arm on a piece of wood. She does not report bruise getting bigger or darker.  Anticoagulation Episode Summary    Current INR goal:   2.5-3.5  TTR:   57.7 % (6.6 y)  Next INR check:   12/28/2017  INR from last check:   3.1 (11/26/2017)  Weekly max warfarin dose:     Target end date:     INR check location:   Anticoagulation Clinic  Preferred lab:     Send INR reminders to:      Indications   Pulmonary embolus (HCC) (Resolved) [I26.99] Chronic deep vein thrombosis (DVT) of both popliteal veins (HCC) [I82.533] Chronic pulmonary embolism (HCC) [I27.82] Long term (current) use of anticoagulants [Z79.01]       Comments:           No Known Allergies Prior to Admission medications   Medication Sig Start Date End Date Taking? Authorizing Provider  aspirin 81 MG chewable tablet Chew 1 tablet (81 mg total) by mouth daily. 10/08/12   Hosie Poisson, MD  carvedilol (COREG) 6.25 MG tablet Take 1 tablet (6.25 mg total) by mouth 2 (two) times daily with a meal. 11/24/17   Lucious Groves, DO  diclofenac sodium (VOLTAREN) 1 % GEL Apply 2-4 g topically 4 (four) times daily as needed (pain). Apply 4 g to knees, 2 grams to shoulders as needed. 11/26/17   Lucious Groves, DO  furosemide (LASIX) 40 MG tablet Take 1 tablet (40 mg total) by mouth daily. 11/26/17   Lucious Groves, DO  HYDROcodone-acetaminophen (NORCO) 10-325 MG tablet Take 1 tablet by mouth every 6 (six) hours as needed. 11/25/17   Lucious Groves, DO  lidocaine (XYLOCAINE) 5 % ointment Apply 1 application topically 2 (two) times daily as needed. 09/10/17   Lucious Groves, DO   lisinopril (PRINIVIL,ZESTRIL) 20 MG tablet Take 1 tablet (20 mg total) by mouth daily. 11/26/17 11/26/18  Lucious Groves, DO  pantoprazole (PROTONIX) 40 MG tablet TAKE 1 TABLET BY MOUTH 30 MINUTES BEFORE LUNCH OR DINNER 04/21/17   Lucious Groves, DO  polyethylene glycol (MIRALAX / GLYCOLAX) packet Take 17 g by mouth daily.    [provider]  rosuvastatin (CRESTOR) 5 MG tablet TAKE ONE TABLET BY MOUTH AT BEDTIME 05/29/17   Lucious Groves, DO  warfarin (COUMADIN) 5 MG tablet Take one tablet by mouth once daily except on Mondays & Thursdays- take one-half tablet on Mondays & Thursdays 02/04/17   Bartholomew Crews, MD   Past Medical History:  Diagnosis Date  . Abdominal hernia   . Arthritis   . CAD (coronary artery disease)    Perioperative non-STEMI March, 2014  //   cardiac catheterization at time of non-STEMI, March, 2014, minor nonobstructive coronary disease, vigorous LV function, possibly vasospastic event versus stress cardiomyopathy. No further workup of coronary disease  . CHF (congestive heart failure) ([place])   . Diverticulosis of colon 10/17/2017   Noted on colonoscopy 10/14/17  . Ejection fraction    65-70%, vigorous function, echo, March, 2011  . Ejection fraction    Vigorous function at time of catheterization October 06, 2012,  . History of  colonic polyps 10/17/2017   Colonoscopy 10/14/17  . Hyperlipidemia   . Hypertension   . Internal hemorrhoids without complication 01/04/736   Noted on colonoscopy 10/14/17  . Lung nodule 12/2009   Very small, left upper lobe by CT June, 2011, one-year followup was stable  . Myocardial infarction ([place]) 09/2013  . OSA (obstructive sleep apnea)    CPAP  . Overweight(278.02)   . Pulmonary embolus ([place]) 01/16/10   on Coumadin indefinitely  . Shortness of breath   . Shoulder pain   . Sleep apnea   . Warfarin anticoagulation    Social History   Socioeconomic History  . Marital status: Single    Spouse name: Not on file  . Number of  children: Not on file  . Years of education: Not on file  . Highest education level: Not on file  Occupational History  . Not on file  Social Needs  . Financial resource strain: Not on file  . Food insecurity:    Worry: Not on file    Inability: Not on file  . Transportation needs:    Medical: Not on file    Non-medical: Not on file  Tobacco Use  . Smoking status: Former Research scientist (life sciences)  . Smokeless tobacco: Never Used  . Tobacco comment: quit 12 years ago  Substance and Sexual Activity  . Alcohol use: Yes    Alcohol/week: 3.6 oz    Types: 6 Cans of beer per week    Comment: Beer sometimes.  . Drug use: Yes    Types: Marijuana    Comment: smoked joint 2 weeks ago  . Sexual activity: Not on file  Lifestyle  . Physical activity:    Days per week: Not on file    Minutes per session: Not on file  . Stress: Not on file  Relationships  . Social connections:    Talks on phone: Not on file    Gets together: Not on file    Attends religious service: Not on file    Active member of club or organization: Not on file    Attends meetings of clubs or organizations: Not on file    Relationship status: Not on file  Other Topics Concern  . Not on file  Social History Narrative   Financial assistance approved for 100% discount at [facility] and has [facility] card per Bonna Gains   12/25/2009   Family History  Problem Relation Age of Onset  . Diabetes Father   . Diabetes Sister   . Colon polyps Sister   . Diabetes Mother   . Ovarian cancer Mother   . Deep vein thrombosis Sister   . Bone cancer Maternal Grandmother   . Breast cancer Neg Hx   . Colon cancer Neg Hx   . Esophageal cancer Neg Hx   . Rectal cancer Neg Hx   . Stomach cancer Neg Hx     ASSESSMENT Recent Results: The most recent result is correlated with Take one (1) tablet of warfarin on Mondays, Wednesdays, Fridays and Saturdays; all other days, take only 1/2 tablet.  Lab Results  Component Value Date   INR 3.1 11/26/2017   INR  3.7 10/30/2017   INR 2.50 10/05/2017    Anticoagulation Dosing: Description   Take one (1) tablet of warfarin on Mondays, Wednesdays, Fridays and Saturdays; all other days, take only 1/2 tablet.      INR today: Therapeutic  PLAN Weekly dose was unchanged   There are no Patient Instructions on file  for this visit. Patient advised to contact clinic or seek medical attention if signs/symptoms of bleeding or thromboembolism occur.  Patient verbalized understanding by repeating back information and was advised to contact me if further medication-related questions arise. Patient was also provided an information handout.  Follow-up Return in about 1 month (around 12/28/2017).  Breanna Lull  Ghalia Wilkins  15 minutes spent face-to-face with the patient during the encounter. 50% of time spent on education. 50% of time was spent on conducting INR.

## 2017-12-01 NOTE — Assessment & Plan Note (Signed)
HPI: still has not started routinely using CPAP, working more, has money to pay outstanding bill, reports she will do this and restart CPAP use  A: OSA  P:Resume CPAP

## 2017-12-01 NOTE — Assessment & Plan Note (Signed)
HPI: BMI 48, has lost a few lbs, healthier eating and working more hours has lead to the loss.  A: Morbid obesity  P: Encouraged to continue weight loss

## 2017-12-01 NOTE — Assessment & Plan Note (Signed)
HPI: No complaints, taking lisinopril 10mg  daily.  A: Essential HTN above goal  P: Increase lisinopril to 20mg  daily Continue Lasix 40mg  daily. Continue Coreg 6.25mg  BID

## 2017-12-01 NOTE — Assessment & Plan Note (Signed)
Since our last visit she was seen here for BRBPR followed up with GI, had colonoscopy which revealed mild diverticulosis, internal hemorrhoids and sessile polyps largest measuring 36mm.  She has not noticed any further bleeding since that time.

## 2017-12-01 NOTE — Progress Notes (Signed)
[facility] Subjective:  HPI: [name] is a 57 y.o. female who presents for chronic pain and HTN follow up.  Please see Assessment and Plan below for the status of her chronic medical problems.  Review of Systems: Review of Systems  Constitutional: Negative for fever.  Respiratory: Negative for cough.   Cardiovascular: Negative for chest pain.  Musculoskeletal: Positive for joint pain. Negative for falls.  Neurological: Negative for dizziness.  Endo/Heme/Allergies: Negative for polydipsia.  Psychiatric/Behavioral: Negative for depression.    Objective:  Physical Exam: Vitals:   11/26/17 0955  BP: (!) 156/85  Pulse: 73  Temp: 97.9 F (36.6 C)  TempSrc: Oral  SpO2: 100%  Weight: (!) 309 lb 8 oz (140.4 kg)  Height: 5\' 7"  (1.702 m)  Physical Exam  Constitutional: No distress.  obese  Cardiovascular: Normal rate, regular rhythm and normal heart sounds.  Pulmonary/Chest: Effort normal and breath sounds normal. No respiratory distress. She has no wheezes.  Abdominal: Soft. Bowel sounds are normal.  Musculoskeletal: She exhibits no edema.       Left shoulder: She exhibits decreased range of motion and tenderness.       Right knee: She exhibits normal range of motion and no effusion. Tenderness found. Medial joint line and lateral joint line tenderness noted.       Left knee: She exhibits normal range of motion.  Nursing note and vitals reviewed.  Assessment & Plan:  Essential hypertension, benign HPI: No complaints, taking lisinopril 10mg  daily.  A: Essential HTN above goal  P: Increase lisinopril to 20mg  daily Continue Lasix 40mg  daily. Continue Coreg 6.25mg  BID  OBSTRUCTIVE SLEEP APNEA HPI: still has not started routinely using CPAP, working more, has money to pay outstanding bill, reports she will do this and restart CPAP use  A: OSA  P:Resume CPAP  BMI 45.0-49.9, adult ([place]) HPI: BMI 48, has lost a few lbs, healthier eating  and working more hours has lead to the loss.  A: Morbid obesity  P: Encouraged to continue weight loss  Chronic pain HPI: She continues to use Hyrocodone 10-325 for chronic knee and shoulder pain.  She has been taking #95 per month allowing her to take 3 pill most days with a few extra pills for bad days where she needs 4.  Otherwise she has had no red flag behavior.  She reports the Hydrocodone-APAP does not seem to be as effective as it once was, she asks if she could potentially change the medication. She does report use of the adjunctive medications of Lidocaine ointment, voltaren gel.  A: Chronic pain treated with opiate analgesic  P:She recently refilled HydrocodoneAPAP 10-325mg  #95 2 days prior to our visit. Database is appropriate.  We did discuss we could try to change to Oxycodone-APAP 10-325 on her next fill date, this change in opioid therapy could provide additional pain relief.  She also reports that she will be staying more in [place], [place].  She will fill her medications at the [facility] there. I reviewed the [place]RS and it was appropraite. Going forward will need to make sure reviewed [place] as well as [place]. Continue Miralax for constipation. Continue adjunctive Lidocaine and Voltaren.  Steroid injections PRN.  BRBPR (bright red blood per rectum) Since our last visit she was seen here for BRBPR followed up with GI, had colonoscopy which revealed mild diverticulosis, internal hemorrhoids and sessile polyps largest measuring 51mm.  She has not noticed any further bleeding since that time.  Long  term (current) use of opiate analgesic Check UTox   Medications Ordered Meds ordered this encounter  Medications  . lisinopril (PRINIVIL,ZESTRIL) 20 MG tablet    Sig: Take 1 tablet (20 mg total) by mouth daily.    Dispense:  90 tablet    Refill:  3  . furosemide (LASIX) 40 MG tablet    Sig: Take 1 tablet (40 mg total) by mouth daily.    Dispense:  90 tablet    Refill:  3  .  diclofenac sodium (VOLTAREN) 1 % GEL    Sig: Apply 2-4 g topically 4 (four) times daily as needed (pain). Apply 4 g to knees, 2 grams to shoulders as needed.    Dispense:  200 g    Refill:  5   Other Orders Orders Placed This Encounter  Procedures  . ToxAssure Select,+Antidepr,UR   Follow Up: Return in about 3 months (around 02/26/2018).

## 2017-12-01 NOTE — Assessment & Plan Note (Signed)
HPI: She continues to use Hyrocodone 10-325 for chronic knee and shoulder pain.  She has been taking #95 per month allowing her to take 3 pill most days with a few extra pills for bad days where she needs 4.  Otherwise she has had no red flag behavior.  She reports the Hydrocodone-APAP does not seem to be as effective as it once was, she asks if she could potentially change the medication. She does report use of the adjunctive medications of Lidocaine ointment, voltaren gel.  A: Chronic pain treated with opiate analgesic  P:She recently refilled HydrocodoneAPAP 10-325mg  #95 2 days prior to our visit. Database is appropriate.  We did discuss we could try to change to Oxycodone-APAP 10-325 on her next fill date, this change in opioid therapy could provide additional pain relief.  She also reports that she will be staying more in [place], [place].  She will fill her medications at the [facility] there. I reviewed the NCSRS and it was appropraite. Going forward will need to make sure reviewed [place] as well as [place]. Continue Miralax for constipation. Continue adjunctive Lidocaine and Voltaren.  Steroid injections PRN.

## 2017-12-01 NOTE — Assessment & Plan Note (Signed)
Check UTox

## 2017-12-07 ENCOUNTER — Telehealth: Payer: Self-pay

## 2017-12-07 LAB — TOXASSURE SELECT,+ANTIDEPR,UR

## 2017-12-07 NOTE — Telephone Encounter (Signed)
Requesting to speak with a nurse about getting a muscle relaxer med. Please call pt back.

## 2017-12-07 NOTE — Telephone Encounter (Signed)
Pt returning phone call ° °

## 2017-12-07 NOTE — Telephone Encounter (Signed)
Rtc, lm for rtc 

## 2017-12-07 NOTE — Telephone Encounter (Signed)
Pt states her L shoulder and down her arm has been hurting "down to the bone" lately, states she has been trying to "play it off" but it is getting more pronounced. States dr [name] [place] gave her a shot in her shoulder/ arm one time and it helped, she thinks muscle relaxers might help. She is ask to come in for an appt for eval and states she will have to wait for awhile. She wants triage to ask dr [name] [place] if he will just give her some muscle relaxers.  She is cautioned if she has chest pain, short of breath, weakness, dizziness, vision changes, changes in speech to call 911 and come to ED Please advise

## 2017-12-08 MED ORDER — CYCLOBENZAPRINE HCL 7.5 MG PO TABS: 7.5000 mg | ORAL_TABLET | Freq: Three times a day (TID) | ORAL | 0 refills | Status: DC | PRN

## 2017-12-08 NOTE — Telephone Encounter (Signed)
Called patient, sent in Rx of Flexeril 7.5mg  TIDPRN (she will likely take only at night, pain is worse at night) discussed caution when using it with other medications especially her hydrocodone.  Avoid flexeril before driving.

## 2017-12-24 ENCOUNTER — Other Ambulatory Visit: Payer: Self-pay | Admitting: Internal Medicine

## 2017-12-24 MED ORDER — OXYCODONE-ACETAMINOPHEN 10-325 MG PO TABS: 1.0000 | ORAL_TABLET | Freq: Four times a day (QID) | ORAL | 0 refills | Status: AC | PRN

## 2017-12-24 NOTE — Telephone Encounter (Signed)
Dr. Norlene Campbell August schedule is not in [place].  Patient can call the first of July to get an appointment scheduled for August.  Sending to back to triage.

## 2017-12-24 NOTE — Telephone Encounter (Signed)
Changed Hydrocodone to Oxycodone, as we discussed at the last visit, I also discussed this with Breanna Wilkins today over the phone, we discussed this may be a little stronger and provide her with additional relief but she will need to be careful trying the new medicaiton.  She will call with any issues.  Rx sent to [place], [place].

## 2017-12-24 NOTE — Telephone Encounter (Signed)
NEEDS REFILL ON PAIN MEDS, TO [facility], [place], [place].

## 2017-12-28 ENCOUNTER — Ambulatory Visit: Payer: Medicare Other

## 2018-01-25 ENCOUNTER — Other Ambulatory Visit: Payer: Self-pay

## 2018-01-25 ENCOUNTER — Ambulatory Visit (INDEPENDENT_AMBULATORY_CARE_PROVIDER_SITE_OTHER): Payer: Medicare Other | Admitting: Pharmacist

## 2018-01-25 ENCOUNTER — Ambulatory Visit (INDEPENDENT_AMBULATORY_CARE_PROVIDER_SITE_OTHER): Payer: Medicare Other | Admitting: Internal Medicine

## 2018-01-25 VITALS — BP 140/70 | HR 73 | Temp 97.7°F | Ht 67.0 in | Wt 318.9 lb

## 2018-01-25 DIAGNOSIS — M1711 Unilateral primary osteoarthritis, right knee: Secondary | ICD-10-CM | POA: Diagnosis not present

## 2018-01-25 DIAGNOSIS — I2609 Other pulmonary embolism with acute cor pulmonale: Secondary | ICD-10-CM

## 2018-01-25 DIAGNOSIS — Z5181 Encounter for therapeutic drug level monitoring: Secondary | ICD-10-CM | POA: Diagnosis not present

## 2018-01-25 DIAGNOSIS — G8929 Other chronic pain: Secondary | ICD-10-CM

## 2018-01-25 DIAGNOSIS — I2782 Chronic pulmonary embolism: Secondary | ICD-10-CM

## 2018-01-25 DIAGNOSIS — Z79891 Long term (current) use of opiate analgesic: Secondary | ICD-10-CM

## 2018-01-25 DIAGNOSIS — I82439 Acute embolism and thrombosis of unspecified popliteal vein: Secondary | ICD-10-CM

## 2018-01-25 DIAGNOSIS — Z7901 Long term (current) use of anticoagulants: Secondary | ICD-10-CM

## 2018-01-25 DIAGNOSIS — I82533 Chronic embolism and thrombosis of popliteal vein, bilateral: Secondary | ICD-10-CM

## 2018-01-25 LAB — POCT INR: INR: 2.9 (ref 2.0–3.0)

## 2018-01-25 MED ORDER — WARFARIN SODIUM 5 MG PO TABS: ORAL_TABLET | ORAL | 2 refills | Status: DC

## 2018-01-25 NOTE — Assessment & Plan Note (Addendum)
Assessment:  Patient here with 3-4 day history of acute worsening of her chronic right knee pain felt to be secondary to OA. She denies any recent trauma but does think she "over did it" at work last week. She requests pain medication refill and also referral to orthopedic surgery (historically has declined this). Exam limited by body habitus but did have TTP BL joint lines and crepitus with movement. Ambulates with cane in clinic.      Plan: Rx for Percocet sent to her pharmacy in [place] by PCP 7/6; patient yet to pick this up but will today. Considered repeat joint injection today but INR 2.9 and would prefer this closer to 2 for a non-emergent procedure. Will order repeat X-rays as last imaging is from 2016. Referral placed for orthopedic surgery.

## 2018-01-25 NOTE — Patient Instructions (Addendum)
It was nice meeting you today, Ms. Mcelhiney.   Today we talked about your knee pain. I've ordered an x-ray given your change in symptoms.   Dr. [name] [place] sent your pain medication to your pharmacy in [place]. This is waiting for you to pick-up.   I've also referred you to orthopedics for evaluation.   You have an appointment next month with Dr. [name] [place], please be sure to keep this appointment.

## 2018-01-25 NOTE — Progress Notes (Signed)
   CC: Worsened right knee pain  HPI:  Breanna Wilkins is a 57 y.o. F with medical history as outlined below who presents today with acute worsening of her chronic right knee pain.   For details regarding today's visit and the status of their chronic medical issues, please refer to the assessment and plan. She has no other acute complaints.   Past Medical History:  Diagnosis Date  . Abdominal hernia   . Arthritis   . CAD (coronary artery disease)    Perioperative non-STEMI March, 2014  //   cardiac catheterization at time of non-STEMI, March, 2014, minor nonobstructive coronary disease, vigorous LV function, possibly vasospastic event versus stress cardiomyopathy. No further workup of coronary disease  . CHF (congestive heart failure) ([place])   . Diverticulosis of colon 10/17/2017   Noted on colonoscopy 10/14/17  . Ejection fraction    65-70%, vigorous function, echo, March, 2011  . Ejection fraction    Vigorous function at time of catheterization October 06, 2012,  . History of colonic polyps 10/17/2017   Colonoscopy 10/14/17  . Hyperlipidemia   . Hypertension   . Internal hemorrhoids without complication 2/42/3536   Noted on colonoscopy 10/14/17  . Lung nodule 12/2009   Very small, left upper lobe by CT June, 2011, one-year followup was stable  . Myocardial infarction ([place]) 09/2013  . OSA (obstructive sleep apnea)    CPAP  . Overweight(278.02)   . Pulmonary embolus ([place]) 01/16/10   on Coumadin indefinitely  . Shortness of breath   . Shoulder pain   . Sleep apnea   . Warfarin anticoagulation    Review of Systems:   General: Admits to weight gain. Denies fevers, chills, weight loss, recent infection HEENT: Denies acute changes in vision, sore throat, headache Cardiac: Denies CP, SOB Pulmonary: Denies cough, wheezing, PND or orthopnea Abd: Denies abdominal pain, abdominal fullness, changes in bowels Extremities: Admits to stable BL LE edema. Admits to BL knee pain R>>L. Denies  falls or weakness.   Physical Exam: General: Alert, in no acute distress. Pleasant and conversant HEENT: No icterus, injection or ptosis. No hoarseness or dysarthria  Cardiac: RRR, no MGR. No JVD apparent.  Pulmonary: CTA BL with normal WOB on RA. Able to speak in complete sentences. Not hypoxic on RA. Abd: Obese abdomen. Soft, non-tender. Extremities: TTP medial and lateral joint lines of right knee. Some increased warmth but no erythema or obvious effusion. No rash. Crepitus with joint movement. Ambulates with cane. Warm, perfused. No pitting edema.   Vitals:   01/25/18 1116  BP: 140/70  Pulse: 73  Temp: 97.7 F (36.5 C)  TempSrc: Oral  SpO2: 100%  Weight: (!) 318 lb 14.4 oz (144.7 kg)  Height: 5\' 7"  (1.702 m)   Body mass index is 49.95 kg/m.  Assessment & Plan:   See Encounters Tab for problem based charting.  Patient discussed with Dr. Daryll Drown

## 2018-01-25 NOTE — Patient Instructions (Signed)
Patient instructed to take medications as defined in the Anti-coagulation Track section of this encounter.  Patient instructed to take today's dose.  Patient instructed to take one (1) tablet of warfarin on Mondays, Wednesdays, Fridays and Saturdays; all other days, take only 1/2 tablet.  Patient verbalized understanding of these instructions.

## 2018-01-25 NOTE — Progress Notes (Signed)
Anticoagulation Management Breanna Wilkins is a 57 y.o. female who reports to the clinic for monitoring of warfarin treatment.    Indication: PE, history of (resolved), Chronic DVT of both popliteal veins; chronic anticoagulation.   Duration: indefinite Supervising physician: Aldine Contes  Anticoagulation Clinic Visit History: Patient does not report signs/symptoms of bleeding or thromboembolism  Other recent changes: No diet, medications, lifestyle changes endorsed.  Anticoagulation Episode Summary    Current INR goal:   2.5-3.5  TTR:   58.7 % (6.8 y)  Next INR check:   02/22/2018  INR from last check:   2.9 (01/25/2018)  Weekly max warfarin dose:     Target end date:     INR check location:   Anticoagulation Clinic  Preferred lab:     Send INR reminders to:      Indications   Pulmonary embolus (HCC) (Resolved) [I26.99] Chronic deep vein thrombosis (DVT) of both popliteal veins (HCC) [I82.533] Chronic pulmonary embolism (HCC) [I27.82] Long term (current) use of anticoagulants [Z79.01]       Comments:           No Known Allergies Prior to Admission medications   Medication Sig Start Date End Date Taking? Authorizing Provider  aspirin 81 MG chewable tablet Chew 1 tablet (81 mg total) by mouth daily. 10/08/12  Yes Hosie Poisson, MD  carvedilol (COREG) 6.25 MG tablet Take 1 tablet (6.25 mg total) by mouth 2 (two) times daily with a meal. 11/24/17  Yes Lucious Groves, DO  cyclobenzaprine (FEXMID) 7.5 MG tablet Take 1 tablet (7.5 mg total) by mouth 3 (three) times daily as needed for muscle spasms. 12/08/17  Yes Lucious Groves, DO  diclofenac sodium (VOLTAREN) 1 % GEL Apply 2-4 g topically 4 (four) times daily as needed (pain). Apply 4 g to knees, 2 grams to shoulders as needed. 11/26/17  Yes Lucious Groves, DO  furosemide (LASIX) 40 MG tablet Take 1 tablet (40 mg total) by mouth daily. 11/26/17  Yes Lucious Groves, DO  HYDROcodone-acetaminophen (NORCO) 10-325 MG tablet Take 1  tablet by mouth every 6 (six) hours as needed. 11/25/17  Yes Lucious Groves, DO  lidocaine (XYLOCAINE) 5 % ointment Apply 1 application topically 2 (two) times daily as needed. 09/10/17  Yes Lucious Groves, DO  lisinopril (PRINIVIL,ZESTRIL) 20 MG tablet Take 1 tablet (20 mg total) by mouth daily. 11/26/17 11/26/18 Yes Lucious Groves, DO  oxyCODONE-acetaminophen (PERCOCET) 10-325 MG tablet Take 1 tablet by mouth every 6 (six) hours as needed for pain. 01/23/18 02/22/18 Yes Lucious Groves, DO  pantoprazole (PROTONIX) 40 MG tablet TAKE 1 TABLET BY MOUTH 30 MINUTES BEFORE LUNCH OR DINNER 04/21/17  Yes Joni Reining C, DO  polyethylene glycol (MIRALAX / GLYCOLAX) packet Take 17 g by mouth daily.   Yes [provider]  rosuvastatin (CRESTOR) 5 MG tablet TAKE ONE TABLET BY MOUTH AT BEDTIME 05/29/17  Yes Lucious Groves, DO  warfarin (COUMADIN) 5 MG tablet Take one tablet by mouth once daily except on Tuesdays, Thursdays and Sundays, take one-half tablet. 01/25/18  Yes Pennie Banter, RPH-CPP   Past Medical History:  Diagnosis Date  . Abdominal hernia   . Arthritis   . CAD (coronary artery disease)    Perioperative non-STEMI March, 2014  //   cardiac catheterization at time of non-STEMI, March, 2014, minor nonobstructive coronary disease, vigorous LV function, possibly vasospastic event versus stress cardiomyopathy. No further workup of coronary disease  . CHF (congestive  heart failure) ([place])   . Diverticulosis of colon 10/17/2017   Noted on colonoscopy 10/14/17  . Ejection fraction    65-70%, vigorous function, echo, March, 2011  . Ejection fraction    Vigorous function at time of catheterization October 06, 2012,  . History of colonic polyps 10/17/2017   Colonoscopy 10/14/17  . Hyperlipidemia   . Hypertension   . Internal hemorrhoids without complication 1/88/4166   Noted on colonoscopy 10/14/17  . Lung nodule 12/2009   Very small, left upper lobe by CT June, 2011, one-year followup was stable  .  Myocardial infarction ([place]) 09/2013  . OSA (obstructive sleep apnea)    CPAP  . Overweight(278.02)   . Pulmonary embolus ([place]) 01/16/10   on Coumadin indefinitely  . Shortness of breath   . Shoulder pain   . Sleep apnea   . Warfarin anticoagulation    Social History   Socioeconomic History  . Marital status: Single    Spouse name: Not on file  . Number of children: Not on file  . Years of education: Not on file  . Highest education level: Not on file  Occupational History  . Not on file  Social Needs  . Financial resource strain: Not on file  . Food insecurity:    Worry: Not on file    Inability: Not on file  . Transportation needs:    Medical: Not on file    Non-medical: Not on file  Tobacco Use  . Smoking status: Former Research scientist (life sciences)  . Smokeless tobacco: Never Used  . Tobacco comment: quit 12 years ago  Substance and Sexual Activity  . Alcohol use: Yes    Alcohol/week: 3.6 oz    Types: 6 Cans of beer per week    Comment: Beer sometimes.  . Drug use: Yes    Types: Marijuana    Comment: smoked joint 2 weeks ago  . Sexual activity: Not on file  Lifestyle  . Physical activity:    Days per week: Not on file    Minutes per session: Not on file  . Stress: Not on file  Relationships  . Social connections:    Talks on phone: Not on file    Gets together: Not on file    Attends religious service: Not on file    Active member of club or organization: Not on file    Attends meetings of clubs or organizations: Not on file    Relationship status: Not on file  Other Topics Concern  . Not on file  Social History Narrative   Financial assistance approved for 100% discount at [facility] and has [facility] card per Bonna Gains   12/25/2009   Family History  Problem Relation Age of Onset  . Diabetes Father   . Diabetes Sister   . Colon polyps Sister   . Diabetes Mother   . Ovarian cancer Mother   . Deep vein thrombosis Sister   . Bone cancer Maternal Grandmother   . Breast cancer Neg  Hx   . Colon cancer Neg Hx   . Esophageal cancer Neg Hx   . Rectal cancer Neg Hx   . Stomach cancer Neg Hx     ASSESSMENT Recent Results: The most recent result is correlated with 27.5 mg per week: Lab Results  Component Value Date   INR 2.9 01/25/2018   INR 3.1 11/26/2017   INR 3.7 10/30/2017    Anticoagulation Dosing: Description   Take one (1) tablet of warfarin on Mondays, Wednesdays,  Fridays and Saturdays; all other days, take only 1/2 tablet.      INR today: Therapeutic  PLAN Weekly dose was unchanged.   Patient Instructions  Patient instructed to take medications as defined in the Anti-coagulation Track section of this encounter.  Patient instructed to take today's dose.  Patient instructed to take one (1) tablet of warfarin on Mondays, Wednesdays, Fridays and Saturdays; all other days, take only 1/2 tablet.  Patient verbalized understanding of these instructions.     Patient advised to contact clinic or seek medical attention if signs/symptoms of bleeding or thromboembolism occur.  Patient verbalized understanding by repeating back information and was advised to contact me if further medication-related questions arise. Patient was also provided an information handout.  Follow-up Return in 1 month (on 02/22/2018) for Follow up INR a 1045h.  Pennie Banter , PharmD, CACP, CPP 15 minutes spent face-to-face with the patient during the encounter. 50% of time spent on education. 50% of time was spent on fingerstick point of care INR sample collection, processing, results determination and documentation in [email or web address].

## 2018-01-25 NOTE — Progress Notes (Signed)
INTERNAL MEDICINE TEACHING ATTENDING ADDENDUM - Hadrian Yarbrough M.D  Duration- indefinite, Indication- PE, DVT, INR- therapeutic. Agree with pharmacy recommendations as outlined in their note.      

## 2018-01-26 ENCOUNTER — Other Ambulatory Visit: Payer: Self-pay | Admitting: Internal Medicine

## 2018-01-26 NOTE — Telephone Encounter (Signed)
Pt came yesterday the nurse told her pain medicine will be at the [facility] in [place], pt @ the pharmacy there is no medicine, pt contact # [phone].

## 2018-01-26 NOTE — Telephone Encounter (Signed)
Call made to [facility] have a refill on file for the percocet and will fill today.  Pt aware.Despina Hidden Cassady7/9/20192:37 PM

## 2018-01-29 NOTE — Progress Notes (Signed)
Internal Medicine Clinic Attending  Case discussed with Dr. Molt at the time of the visit.  We reviewed the resident's history and exam and pertinent patient test results.  I agree with the assessment, diagnosis, and plan of care documented in the resident's note. 

## 2018-02-02 DIAGNOSIS — M1711 Unilateral primary osteoarthritis, right knee: Secondary | ICD-10-CM | POA: Diagnosis not present

## 2018-02-02 DIAGNOSIS — M25561 Pain in right knee: Secondary | ICD-10-CM | POA: Diagnosis not present

## 2018-02-12 ENCOUNTER — Ambulatory Visit (INDEPENDENT_AMBULATORY_CARE_PROVIDER_SITE_OTHER): Payer: Self-pay | Admitting: Orthopaedic Surgery

## 2018-02-22 ENCOUNTER — Ambulatory Visit: Payer: Medicare Other

## 2018-02-25 ENCOUNTER — Ambulatory Visit (INDEPENDENT_AMBULATORY_CARE_PROVIDER_SITE_OTHER): Payer: Medicare Other | Admitting: Internal Medicine

## 2018-02-25 ENCOUNTER — Other Ambulatory Visit: Payer: Self-pay

## 2018-02-25 ENCOUNTER — Encounter: Payer: Self-pay | Admitting: Internal Medicine

## 2018-02-25 VITALS — BP 117/68 | HR 86 | Temp 97.9°F | Ht 64.0 in | Wt 308.5 lb

## 2018-02-25 DIAGNOSIS — G8929 Other chronic pain: Secondary | ICD-10-CM | POA: Diagnosis not present

## 2018-02-25 DIAGNOSIS — E559 Vitamin D deficiency, unspecified: Secondary | ICD-10-CM | POA: Diagnosis not present

## 2018-02-25 DIAGNOSIS — M79662 Pain in left lower leg: Secondary | ICD-10-CM

## 2018-02-25 DIAGNOSIS — G4733 Obstructive sleep apnea (adult) (pediatric): Secondary | ICD-10-CM

## 2018-02-25 DIAGNOSIS — Z79891 Long term (current) use of opiate analgesic: Secondary | ICD-10-CM

## 2018-02-25 DIAGNOSIS — Z9119 Patient's noncompliance with other medical treatment and regimen: Secondary | ICD-10-CM

## 2018-02-25 DIAGNOSIS — M25512 Pain in left shoulder: Secondary | ICD-10-CM

## 2018-02-25 DIAGNOSIS — Z6841 Body Mass Index (BMI) 40.0 and over, adult: Secondary | ICD-10-CM

## 2018-02-25 DIAGNOSIS — M1711 Unilateral primary osteoarthritis, right knee: Secondary | ICD-10-CM

## 2018-02-25 DIAGNOSIS — Z86711 Personal history of pulmonary embolism: Secondary | ICD-10-CM

## 2018-02-25 DIAGNOSIS — Z86718 Personal history of other venous thrombosis and embolism: Secondary | ICD-10-CM

## 2018-02-25 DIAGNOSIS — Z79899 Other long term (current) drug therapy: Secondary | ICD-10-CM

## 2018-02-25 DIAGNOSIS — Z7901 Long term (current) use of anticoagulants: Secondary | ICD-10-CM | POA: Diagnosis not present

## 2018-02-25 LAB — D-DIMER, QUANTITATIVE: D-Dimer, Quant: <0.27 ug/mL-FEU (ref 0.00–0.50)

## 2018-02-25 LAB — POCT INR: INR: 2.5 (ref 2.0–3.0)

## 2018-02-25 MED ORDER — OXYCODONE-ACETAMINOPHEN 10-325 MG PO TABS: 1.0000 | ORAL_TABLET | Freq: Four times a day (QID) | ORAL | 0 refills | Status: DC | PRN

## 2018-02-25 MED ORDER — OXYCODONE-ACETAMINOPHEN 10-325 MG PO TABS: 1.0000 | ORAL_TABLET | Freq: Four times a day (QID) | ORAL | 0 refills | Status: AC | PRN

## 2018-02-25 NOTE — Progress Notes (Signed)
Subjective:  HPI: [name] is a 57 y.o. female who presents for follow up of chronic knee and shoulder pain.  Please see Assessment and Plan below for the status of her chronic medical problems.  Review of Systems: Review of Systems  Constitutional: Negative for fever.  Respiratory: Negative for cough and hemoptysis.   Cardiovascular: Negative for chest pain, orthopnea and leg swelling.  Gastrointestinal: Negative for constipation.  Musculoskeletal: Positive for joint pain (right knee, left shoulder).    Objective:  Physical Exam: Vitals:   02/25/18 1516  BP: 117/68  Pulse: 86  Temp: 97.9 F (36.6 C)  TempSrc: Oral  SpO2: 100%  Weight: (!) 308 lb 8 oz (139.9 kg)  Height: 5\' 4"  (1.626 m)   Physical Exam  Constitutional: No distress.  obese  HENT:  Head: Normocephalic and atraumatic.  Eyes: Conjunctivae are normal.  Cardiovascular: Normal rate, regular rhythm, normal heart sounds and intact distal pulses.  No murmur heard. Pulmonary/Chest: Effort normal and breath sounds normal. No respiratory distress. She has no wheezes. She has no rales.  Abdominal: Soft. Bowel sounds are normal. She exhibits no distension. There is no tenderness.  Musculoskeletal: She exhibits no edema.       Right knee: She exhibits no swelling and no effusion. Tenderness found. Medial joint line and lateral joint line tenderness noted.       Left knee: She exhibits no swelling and no effusion. Tenderness found. Medial joint line tenderness noted. No lateral joint line tenderness noted.       Right lower leg: She exhibits no tenderness, no swelling and no edema.       Left lower leg: She exhibits tenderness. She exhibits no swelling and no edema.  Skin: Skin is warm and dry. She is not diaphoretic.  Psychiatric: Judgment normal.  Nursing note and vitals reviewed.  Assessment & Plan:  Pain of left calf HPI: Reports about 1 week of increased left calf pain. No trauma, no recent  immobility. No redness or swelling.  Has history of DVT and PE, concerned about DVT.  Has been adherent to warfarin therapy.  A: Left calf pain  P: has history of this, she does have risk factors for DVT with history and some pain on palpation however I feel it may be more likely to be electrolyte disturbance.  Will check CMP, mag and vit d.  Wells DVT score of 0 so will obtain D dimer to rule out DVT  Primary osteoarthritis of right knee HPI: Continues to have left knee pain, for which we have been doing steroid injections, recently was seen in our clinic and referred to ortho.  Plan for TKR when she can get weight below 250.  A: OA of right knee  P: Now following with ortho, continue steroid injections and wegiht loss  OBSTRUCTIVE SLEEP APNEA Still not using CPAP, she has again assured me that she will contact advance home care and get the supplies she needs.  I have encouraged this and related this may help with her goal of weight loss  BMI 45.0-49.9, adult ([place]) Needs to get weight less than 250 for knee replacement surgery.  Chronic pain HPI: Reports pain is better controlled after switch to oxycodone from hydrocondone.  Specifically shoulder is more responsive.  Knee has still been an issue, she continues to work out at [employer] and reports she is more serious about weight loss. She did have some increased back pain after a long drive to [place] but  successfully use application of heat therapy. No constipation.   A: Chronic pain, predominately left shoulder and right knee  P:  No red flags.  [facility] [place] [facility])- appropriate. - Continue oxycodone-apap 10-325 q6prn #100 per month   Medications Ordered Meds ordered this encounter  Medications  . oxyCODONE-acetaminophen (PERCOCET) 10-325 MG tablet    Sig: Take 1 tablet by mouth every 6 (six) hours as needed for pain.    Dispense:  100 tablet    Refill:  0    Rx 1/3  .  oxyCODONE-acetaminophen (PERCOCET) 10-325 MG tablet    Sig: Take 1 tablet by mouth every 6 (six) hours as needed for pain.    Dispense:  100 tablet    Refill:  0    Rx 2/3  . oxyCODONE-acetaminophen (PERCOCET) 10-325 MG tablet    Sig: Take 1 tablet by mouth every 6 (six) hours as needed for pain.    Dispense:  100 tablet    Refill:  0    Rx 3/3   Other Orders Orders Placed This Encounter  Procedures  . CMP14 + Anion Gap  . Magnesium  . Vitamin D (25 hydroxy)  . D-dimer, quantitative (not at [facility]  . POCT INR   Follow Up: Return in about 3 months (around 05/28/2018).

## 2018-02-25 NOTE — Assessment & Plan Note (Signed)
HPI: Reports about 1 week of increased left calf pain. No trauma, no recent immobility. No redness or swelling.  Has history of DVT and PE, concerned about DVT.  Has been adherent to warfarin therapy.  A: Left calf pain  P: has history of this, she does have risk factors for DVT with history and some pain on palpation however I feel it may be more likely to be electrolyte disturbance.  Will check CMP, mag and vit d.  Wells DVT score of 0 so will obtain D dimer to rule out DVT

## 2018-02-25 NOTE — Patient Instructions (Signed)
I want your to continue your current dose of lisinopril, if it is 10mg  you can continue that.  I will call if your d-dimer is elevated as we may need to get the ultrasound of your leg.  However your INR is in the appropriate range currently.  I will call with the results of your other bloodwork.

## 2018-02-25 NOTE — Assessment & Plan Note (Signed)
HPI: Continues to have left knee pain, for which we have been doing steroid injections, recently was seen in our clinic and referred to ortho.  Plan for TKR when she can get weight below 250.  A: OA of right knee  P: Now following with ortho, continue steroid injections and wegiht loss

## 2018-02-26 LAB — CMP14 + ANION GAP
ALT: 15 IU/L (ref 0–32)
ANION GAP: 18 mmol/L (ref 10.0–18.0)
AST: 17 IU/L (ref 0–40)
Albumin/Globulin Ratio: 1.3 (ref 1.2–2.2)
Albumin: 4.2 g/dL (ref 3.5–5.5)
Alkaline Phosphatase: 66 IU/L (ref 39–117)
BUN/Creatinine Ratio: 16 (ref 9–23)
BUN: 13 mg/dL (ref 6–24)
Bilirubin Total: 0.3 mg/dL (ref 0.0–1.2)
CALCIUM: 9.7 mg/dL (ref 8.7–10.2)
CO2: 23 mmol/L (ref 20–29)
CREATININE: 0.81 mg/dL (ref 0.57–1.00)
Chloride: 102 mmol/L (ref 96–106)
GFR calc Af Amer: 93 mL/min/{1.73_m2} (ref >59)
GFR, EST NON AFRICAN AMERICAN: 80 mL/min/{1.73_m2} (ref >59)
GLOBULIN, TOTAL: 3.3 g/dL (ref 1.5–4.5)
GLUCOSE: 98 mg/dL (ref 65–99)
Potassium: 4.1 mmol/L (ref 3.5–5.2)
Sodium: 143 mmol/L (ref 134–144)
Total Protein: 7.5 g/dL (ref 6.0–8.5)

## 2018-02-26 LAB — VITAMIN D 25 HYDROXY (VIT D DEFICIENCY, FRACTURES): Vit D, 25-Hydroxy: 22.8 ng/mL — ABNORMAL LOW (ref 30.0–100.0)

## 2018-02-26 LAB — MAGNESIUM: MAGNESIUM: 2.1 mg/dL (ref 1.6–2.3)

## 2018-03-02 NOTE — Assessment & Plan Note (Signed)
HPI: Reports pain is better controlled after switch to oxycodone from hydrocondone.  Specifically shoulder is more responsive.  Knee has still been an issue, she continues to work out at [employer] and reports she is more serious about weight loss. She did have some increased back pain after a long drive to [place] but successfully use application of heat therapy. No constipation.   A: Chronic pain, predominately left shoulder and right knee  P:  No red flags.  [facility] [place] [facility])- appropriate. - Continue oxycodone-apap 10-325 q6prn #100 per month

## 2018-03-02 NOTE — Assessment & Plan Note (Signed)
Needs to get weight less than 250 for knee replacement surgery.

## 2018-03-02 NOTE — Assessment & Plan Note (Signed)
Still not using CPAP, she has again assured me that she will contact advance home care and get the supplies she needs.  I have encouraged this and related this may help with her goal of weight loss

## 2018-04-20 ENCOUNTER — Other Ambulatory Visit: Payer: Self-pay | Admitting: *Deleted

## 2018-04-20 MED ORDER — PANTOPRAZOLE SODIUM 40 MG PO TBEC: DELAYED_RELEASE_TABLET | ORAL | 3 refills | Status: DC

## 2018-05-03 ENCOUNTER — Encounter: Payer: Self-pay | Admitting: Internal Medicine

## 2018-05-03 ENCOUNTER — Ambulatory Visit (INDEPENDENT_AMBULATORY_CARE_PROVIDER_SITE_OTHER): Payer: Medicare Other

## 2018-05-03 ENCOUNTER — Ambulatory Visit (INDEPENDENT_AMBULATORY_CARE_PROVIDER_SITE_OTHER): Payer: Medicare Other | Admitting: Internal Medicine

## 2018-05-03 ENCOUNTER — Other Ambulatory Visit: Payer: Self-pay

## 2018-05-03 DIAGNOSIS — I2782 Chronic pulmonary embolism: Secondary | ICD-10-CM

## 2018-05-03 DIAGNOSIS — M1711 Unilateral primary osteoarthritis, right knee: Secondary | ICD-10-CM | POA: Diagnosis not present

## 2018-05-03 DIAGNOSIS — Z7901 Long term (current) use of anticoagulants: Secondary | ICD-10-CM

## 2018-05-03 DIAGNOSIS — G8929 Other chronic pain: Secondary | ICD-10-CM

## 2018-05-03 DIAGNOSIS — Z5181 Encounter for therapeutic drug level monitoring: Secondary | ICD-10-CM | POA: Diagnosis not present

## 2018-05-03 DIAGNOSIS — I2609 Other pulmonary embolism with acute cor pulmonale: Secondary | ICD-10-CM

## 2018-05-03 DIAGNOSIS — I82533 Chronic embolism and thrombosis of popliteal vein, bilateral: Secondary | ICD-10-CM | POA: Diagnosis not present

## 2018-05-03 LAB — POCT INR: INR: 2.4 (ref 2.0–3.0)

## 2018-05-03 MED ORDER — RIVAROXABAN 20 MG PO TABS
20.0000 mg | ORAL_TABLET | Freq: Every day | ORAL | 3 refills | Status: DC
Start: 2018-05-03 — End: 2019-06-06

## 2018-05-03 NOTE — Progress Notes (Signed)
CLINICAL PHARMACY ANTICOAGULATION TRANSITION NOTE Breanna Wilkins is a 57 y.o. female who is currently on an anti-coagulation regimen.  RECENT RESULTS: Lab Results  Component Value Date   INR 2.4 05/03/2018   INR 2.5 02/25/2018   INR 2.9 01/25/2018   ANTI-COAG DOSE: Description   Stop taking warfarin and start taking Xarelto 20mg  by mouth with food once daily     ANTICOAG SUMMARY: Anticoagulation Episode Summary    Current INR goal:   2.5-3.5  TTR:   57.7 % (7 y)  Next INR check:   02/22/2018  INR from last check:   2.4! (05/03/2018)  Weekly max warfarin dose:     Target end date:     INR check location:   Anticoagulation Clinic  Preferred lab:     Send INR reminders to:      Indications   Pulmonary embolus (HCC) (Resolved) [I26.99] Chronic deep vein thrombosis (DVT) of both popliteal veins (HCC) [I82.533] Chronic pulmonary embolism ([place]) [I27.82] Long term (current) use of anticoagulants [Z79.01]       Comments:          ANTICOAG TODAY: Anticoagulation Summary  As of 05/03/2018   INR goal:   2.5-3.5  TTR:   57.7 % (7 y)  INR used for dosing:   2.4! (05/03/2018)  Warfarin maintenance plan:   No maintenance plan  Plan last modified:   Rae Mar, RPH (05/03/2018)  Next INR check:     Target end date:      Indications   Pulmonary embolus ([place]) (Resolved) [I26.99] Chronic deep vein thrombosis (DVT) of both popliteal veins (HCC) [I82.533] Chronic pulmonary embolism ([place]) [I27.82] Long term (current) use of anticoagulants [Z79.01]        Anticoagulation Episode Summary    INR check location:   Anticoagulation Clinic   Preferred lab:      Send INR reminders to:      Comments:         ASSESSMENT Indication(s): PE and chronic DVT of both popliteal veins Duration: indefinite  Labs:    Component Value Date/Time   AST 17 02/25/2018 1604   ALT 15 02/25/2018 1604   NA 143 02/25/2018 1604   K 4.1 02/25/2018 1604   CL 102 02/25/2018 1604   CO2 23  02/25/2018 1604   GLUCOSE 98 02/25/2018 1604   GLUCOSE 141 (H) 10/30/2014 1406   HGBA1C 5.9 12/22/2016 1025   BUN 13 02/25/2018 1604   CREATININE 0.81 02/25/2018 1604   CREATININE 0.77 10/30/2014 1406   CALCIUM 9.7 02/25/2018 1604   GFRNONAA 80 02/25/2018 1604   GFRNONAA 87 10/30/2014 1406   GFRAA 93 02/25/2018 1604   GFRAA >89 10/30/2014 1406   WBC 7.5 08/25/2017 1131   WBC 6.5 12/20/2012 1656   HGB 12.5 08/25/2017 1131   HCT 39.0 08/25/2017 1131   PLT 329 08/25/2017 1131    The following has been considered to transition the patient to a direct oral anticoagulant:   [x]  Contraindications to direct oral anticoagulants [x]  Renal dysfunction for dose adjustments [x]  Hepatic dysfunction for dose adjustments [x]  Drug-drug interactions [x]  Drug-disease interactions [x]  Financial barriers    Adherence: Patient has no known adherence challenges but is not adherent to INR follow-up appointments.  Safety: Patient reports no recent signs or symptoms of bleeding. Patient reports no signs of symptoms of thrombosis.       Recommendations:   Warfarin will be discontinued today, as INR is < 3.     Transition  to new oral anticoagulant rivaroxaban (Xarelto) 20mg  by mouth once daily with food.  Monitoring: PRN, CMP and CBC   PLAN Implement changes as recommended above: stop taking warfarin and start taking Xarelto 20mg  by mouth once daily with food. Patient has received samples from the clinic.     Follow-up Will call patient to ensure is doing well on medication change and is able to get from pharmacy. Follow-up appointments will be made as indicated.   Rae Mar  Clinical Pharmacist 05/03/2018, 12:25 PM

## 2018-05-03 NOTE — Assessment & Plan Note (Signed)
INR today 2.4. She has not always been compliant with her appointments with pharmacy and is interested in trying another medication where she will not have to come in to have her INR checked. She will f/u with pharmacy to discuss possible alternative options but does not want to look into changing any medications today.

## 2018-05-03 NOTE — Progress Notes (Signed)
   CC: right knee pain  HPI:  [name] is a 57 y.o. with PMH as below presenting with chronic right knee pain.   Please see A&P for assessment of the patient's chronic medical conditions.   Past Medical History:  Diagnosis Date  . Abdominal hernia   . Arthritis   . CAD (coronary artery disease)    Perioperative non-STEMI March, 2014  //   cardiac catheterization at time of non-STEMI, March, 2014, minor nonobstructive coronary disease, vigorous LV function, possibly vasospastic event versus stress cardiomyopathy. No further workup of coronary disease  . CHF (congestive heart failure) ([place])   . Diverticulosis of colon 10/17/2017   Noted on colonoscopy 10/14/17  . Ejection fraction    65-70%, vigorous function, echo, March, 2011  . Ejection fraction    Vigorous function at time of catheterization October 06, 2012,  . History of colonic polyps 10/17/2017   Colonoscopy 10/14/17  . Hyperlipidemia   . Hypertension   . Internal hemorrhoids without complication 08/30/4707   Noted on colonoscopy 10/14/17  . Lung nodule 12/2009   Very small, left upper lobe by CT June, 2011, one-year followup was stable  . Myocardial infarction ([place]) 09/2013  . OSA (obstructive sleep apnea)    CPAP  . Overweight(278.02)   . Pulmonary embolus ([place]) 01/16/10   on Coumadin indefinitely  . Shortness of breath   . Shoulder pain   . Sleep apnea   . Warfarin anticoagulation    Review of Systems:   Review of Systems  Musculoskeletal: Positive for joint pain. Negative for back pain, falls and myalgias.  Neurological: Negative for tingling, tremors and sensory change.  All other systems reviewed and are negative.   Physical Exam:  Constitution: NAD, obese MSK: TTP right knee and patella, mild swelling superiorly, negative erythema Neuro: A&Ox3, cooperative Skin: c/d/i   Vitals:   05/03/18 0941  BP: (!) 155/67  Pulse: 63  Temp: 97.9 F (36.6 C)  TempSrc: Oral  SpO2: 100%  Weight: (!) 317 lb  (143.8 kg)  Height: 5\' 7"  (1.702 m)     Assessment & Plan:   See Encounters Tab for problem based charting.  Patient seen with Dr. Daryll Drown

## 2018-05-03 NOTE — Patient Instructions (Signed)
Patient instructed to take medications as defined in the Anti-coagulation Track section of this encounter.  Patient instructed to stop taking warfarin and start taking Xarelto 20mg  by mouth once daily with food starting today  Patient verbalized understanding of these instructions.

## 2018-05-03 NOTE — Patient Instructions (Signed)
Thank you for allowing [place] to provide your care today. Today we discussed your right knee pain and osteoarthritis  Please follow-up on the afternoon of November 5th to meet with Dr. [name] [place].    Should you have any questions or concerns please call the internal medicine clinic at [phone].

## 2018-05-03 NOTE — Assessment & Plan Note (Addendum)
She states her right knee has been hurting more and more recently and requests to have a steroid injection from Dr. [name] [place]. She thought he would be here today and would like to defer her injection until she can schedule with him. She has gained several lbs in the past month and realizes this is likely contributing to her pain. She previously saw ortho for her knee but does not want to have a knee replacement as she feels she is too old for this.  We discussed diet and while she did not want to meet with Butch Penny, RD, today she will consider this in the future. We discussed eating more vegetables, especially unflavored frozen as this is convenient, and decreasing starches, fats and suguars. She said she would like to try this and will hold off on further medication management.   - reschedule f/u to see Dr. [name]  11/5 - discuss change in diet

## 2018-05-04 ENCOUNTER — Other Ambulatory Visit: Payer: Self-pay | Admitting: Internal Medicine

## 2018-05-04 DIAGNOSIS — Z1231 Encounter for screening mammogram for malignant neoplasm of breast: Secondary | ICD-10-CM

## 2018-05-06 NOTE — Progress Notes (Addendum)
Patient was seen in clinic with Juanell Fairly, PharmD, PGY1 pharmacy resident. I agree with the assessment and plan of care documented.

## 2018-05-11 NOTE — Progress Notes (Signed)
Internal Medicine Clinic Attending  I saw and evaluated the patient.  I personally confirmed the key portions of the history and exam documented by Dr. Seawell and I reviewed pertinent patient test results.  The assessment, diagnosis, and plan were formulated together and I agree with the documentation in the resident's note.     

## 2018-05-24 ENCOUNTER — Other Ambulatory Visit: Payer: Self-pay | Admitting: Internal Medicine

## 2018-05-24 NOTE — Progress Notes (Signed)
Cardiology Office Note   Date:  05/25/2018   ID:  Breanna Wilkins, DOB 10-25-1960, MRN [id number]  PCP:  Lucious Groves, DO  Cardiologist:  Dr. Tamala Julian     Chief Complaint  Patient presents with  . Shortness of Breath    chronic diastolic HF      History of Present Illness: Breanna Wilkins is a 57 y.o. female who presents for chronic diastolic HF.    She has a hx of morbid obesity, obstructive sleep apnea, history of recurrent pulmonary emboli was on chronic Coumadin therapy now on xarelto, chronic diastolic heart failure, and known nonobstructive coronary disease by cath in 2014 at the time of non-ST elevation.  Today she has no chest pain, but increased DOE that has come with her wt gain.  We discussed wt loss and exercise.  She does have arthritis and this causes problems with exercise.  [facility]. discussed [employer] and [facility].   She has not been wearing her CPAP and discussed importance of wearing.  She is agreeable to resume.      Past Medical History:  Diagnosis Date  . Abdominal hernia   . Arthritis   . CAD (coronary artery disease)    Perioperative non-STEMI March, 2014  //   cardiac catheterization at time of non-STEMI, March, 2014, minor nonobstructive coronary disease, vigorous LV function, possibly vasospastic event versus stress cardiomyopathy. No further workup of coronary disease  . CHF (congestive heart failure) ([place])   . Diverticulosis of colon 10/17/2017   Noted on colonoscopy 10/14/17  . Ejection fraction    65-70%, vigorous function, echo, March, 2011  . Ejection fraction    Vigorous function at time of catheterization October 06, 2012,  . History of colonic polyps 10/17/2017   Colonoscopy 10/14/17  . Hyperlipidemia   . Hypertension   . Internal hemorrhoids without complication 2/35/5732   Noted on colonoscopy 10/14/17  . Lung nodule 12/2009   Very small, left upper lobe by CT June, 2011, one-year  followup was stable  . Myocardial infarction ([place]) 09/2013  . OSA (obstructive sleep apnea)    CPAP  . Overweight(278.02)   . Pulmonary embolus ([place]) 01/16/10   on Coumadin indefinitely  . Shortness of breath   . Shoulder pain   . Sleep apnea   . Warfarin anticoagulation     Past Surgical History:  Procedure Laterality Date  . ABDOMINAL HYSTERECTOMY  07/2002   laparoscopic assisted vaginal hysterectomy for menorrhagia, dysmenorrhea, anemia, fibroids  . COLOSTOMY    . INCISIONAL HERNIA REPAIR N/A 09/27/2012   Procedure: LAPAROSCOPIC INCISIONAL HERNIA possible open;  Surgeon: Adin Hector, MD;  Location: [facility];  Service: General;  Laterality: N/A;  . INSERTION OF MESH N/A 09/27/2012   Procedure: INSERTION OF MESH;  Surgeon: Adin Hector, MD;  Location: [facility];  Service: General;  Laterality: N/A;  . KNEE ARTHROSCOPY Left   . LEFT HEART CATHETERIZATION WITH CORONARY ANGIOGRAM N/A 10/06/2012   Procedure: LEFT HEART CATHETERIZATION WITH CORONARY ANGIOGRAM;  Surgeon: Sherren Mocha, MD;  Location: [facility] CATH LAB;  Service: Cardiovascular;  Laterality: N/A;     Current Outpatient Medications  Medication Sig Dispense Refill  . aspirin 81 MG chewable tablet Chew 1 tablet (81 mg total) by mouth daily. 30 tablet 1  . carvedilol (COREG) 6.25 MG tablet Take 1 tablet (6.25 mg total) by mouth 2 (two) times daily with a meal. 180 tablet  1  . cyclobenzaprine (FEXMID) 7.5 MG tablet Take 1 tablet (7.5 mg total) by mouth 3 (three) times daily as needed for muscle spasms. 15 tablet 0  . diclofenac sodium (VOLTAREN) 1 % GEL Apply 2-4 g topically 4 (four) times daily as needed (pain). Apply 4 g to knees, 2 grams to shoulders as needed. 200 g 5  . furosemide (LASIX) 40 MG tablet Take 1 tablet (40 mg total) by mouth daily. 90 tablet 3  . lidocaine (XYLOCAINE) 5 % ointment Apply 1 application topically 2 (two) times daily as needed. 150 g 1  . lisinopril (PRINIVIL,ZESTRIL) 20 MG tablet Take 1 tablet (20 mg  total) by mouth daily. 90 tablet 3  . oxyCODONE-acetaminophen (PERCOCET) 10-325 MG tablet Take 1 tablet by mouth every 6 (six) hours as needed for pain. 100 tablet 0  . pantoprazole (PROTONIX) 40 MG tablet TAKE 1 TABLET BY MOUTH 30 MINUTES BEFORE LUNCH OR DINNER 90 tablet 3  . polyethylene glycol (MIRALAX / GLYCOLAX) packet Take 17 g by mouth daily.    . rivaroxaban (XARELTO) 20 MG TABS tablet Take 1 tablet (20 mg total) by mouth daily with supper. 90 tablet 3  . rosuvastatin (CRESTOR) 5 MG tablet TAKE ONE TABLET BY MOUTH AT BEDTIME 90 tablet 3   Current Facility-Administered Medications  Medication Dose Route Frequency Provider Last Rate Last Dose  . 0.9 %  sodium chloride infusion  500 mL Intravenous Once Pyrtle, Lajuan Lines, MD        Allergies:   Patient has no known allergies.    Social History:  The patient  reports that she quit smoking about 30 years ago. She has never used smokeless tobacco. She reports that she drinks about 6.0 standard drinks of alcohol per week. She reports that she has current or past drug history. Drug: Marijuana.   Family History:  The patient's family history includes Bone cancer in her maternal grandmother; Colon polyps in her sister; Deep vein thrombosis in her sister; Diabetes in her father, mother, and sister; Ovarian cancer in her mother.    ROS:  General:no colds or fevers, + weight gain Skin:no rashes or ulcers HEENT:no blurred vision, no congestion CV:see HPI PUL:see HPI GI:no diarrhea constipation or melena, no indigestion GU:no hematuria, no dysuria MS:no joint pain, no claudication Neuro:no syncope, no lightheadedness Endo:no diabetes, no thyroid disease  Wt Readings from Last 3 Encounters:  05/25/18 (!) 318 lb (144.2 kg)  05/03/18 (!) 317 lb (143.8 kg)  02/25/18 (!) 308 lb 8 oz (139.9 kg)     PHYSICAL EXAM: VS:  BP 124/72   Pulse 76   Ht 5\' 7"  (1.702 m)   Wt (!) 318 lb (144.2 kg)   SpO2 93%   BMI 49.81 kg/m  , BMI Body mass index is  49.81 kg/m. General:Pleasant affect, NAD Skin:Warm and dry, brisk capillary refill HEENT:normocephalic, sclera clear, mucus membranes moist Neck:supple, no JVD, no bruits  Heart:S1S2 RRR without murmur, gallup, rub or click Lungs:clear without rales, rhonchi, or wheezes WVP:XTGG, non tender, + BS, do not palpate liver spleen or masses Ext:no lower ext edema, 2+ pedal pulses, 2+ radial pulses Neuro:alert and oriented X 3, MAE, follows commands, + facial symmetry    EKG:  EKG is ordered today. The ekg ordered today demonstrates SR no changes from last visit.   Recent Labs: 08/25/2017: Hemoglobin 12.5; Platelets 329 02/25/2018: ALT 15; BUN 13; Creatinine, Ser 0.81; Magnesium 2.1; Potassium 4.1; Sodium 143    Lipid Panel  Component Value Date/Time   CHOL 159 09/21/2015 1625   TRIG 127 09/21/2015 1625   HDL 48 09/21/2015 1625   CHOLHDL 3.3 09/21/2015 1625   CHOLHDL 3.9 10/17/2013 1016   VLDL 20 10/17/2013 1016   LDLCALC 86 09/21/2015 1625       Other studies Reviewed: Additional studies/ records that were reviewed today include: . Echocardiogram performed November 2017: Study Conclusions  - Left ventricle: The cavity size was normal. Wall thickness was increased in a pattern of mild LVH. Systolic function was normal. The estimated ejection fraction was in the range of 60% to 65%. Wall motion was normal; there were no regional wall motion abnormalities. Doppler parameters are consistent with abnormal left ventricular relaxation (grade 1 diastolic dysfunction). - Pulmonary arteries: Systolic pressure was mildly increased. PA peak pressure: 32 mm Hg (S). - Pericardium, extracardiac: A small pericardial effusion was identified.   ASSESSMENT AND PLAN:  1. Chronic diastolic HF, euvolemic today continue meds + DOE, stable.  follow up with Dr. Tamala Julian  2.  Non obst CAD no angina.    3.  OSA had not been using CPAP, discussed importance  4.  Morbid obesity  discussed diet and exercise. She is feeling depressed with gaining wt and would like to lose.    5.  HLD, she did eat today, will ask PCP to check lipids  6.  Hx PE, DVT on  xeralto.   Current medicines are reviewed with the patient today.  The patient Has no concerns regarding medicines.  The following changes have been made:  See above Labs/ tests ordered today include:see above  Disposition:   FU:  see above  Signed, Breanna Kicks, NP  05/25/2018 10:40 AM    [facility] [place], [place], [place], [place] Phone: [phone]; Fax: [phone]

## 2018-05-24 NOTE — Telephone Encounter (Signed)
Refill Request forcarvedilol (COREG) 6.25 MG tablet .  Pt states she call the pharmacy @ the [facility] in [place] on 05/20/2017.  Patient also needing her   .oxyCODONE-acetaminophen (PERCOCET) 10-325 MG tablet   [facility] [place], [place]

## 2018-05-25 ENCOUNTER — Ambulatory Visit
Admission: RE | Admit: 2018-05-25 | Discharge: 2018-05-25 | Disposition: A | Discharge status: Home / Self Care | Payer: Medicare Other | Source: Ambulatory Visit | Attending: Internal Medicine | Admitting: Internal Medicine

## 2018-05-25 ENCOUNTER — Ambulatory Visit (INDEPENDENT_AMBULATORY_CARE_PROVIDER_SITE_OTHER): Payer: Medicare Other | Admitting: Cardiology

## 2018-05-25 ENCOUNTER — Ambulatory Visit: Payer: Medicare Other

## 2018-05-25 ENCOUNTER — Encounter: Payer: Self-pay | Admitting: Cardiology

## 2018-05-25 VITALS — BP 124/72 | HR 76 | Ht 67.0 in | Wt 318.0 lb

## 2018-05-25 DIAGNOSIS — R0609 Other forms of dyspnea: Secondary | ICD-10-CM

## 2018-05-25 DIAGNOSIS — I10 Essential (primary) hypertension: Secondary | ICD-10-CM | POA: Diagnosis not present

## 2018-05-25 DIAGNOSIS — Z1231 Encounter for screening mammogram for malignant neoplasm of breast: Secondary | ICD-10-CM

## 2018-05-25 MED ORDER — CARVEDILOL 6.25 MG PO TABS: 6.2500 mg | ORAL_TABLET | Freq: Two times a day (BID) | ORAL | 3 refills | Status: DC

## 2018-05-25 NOTE — Patient Instructions (Signed)
Medication Instructions:  Your physician recommends that you continue on your current medications as directed. Please refer to the Current Medication list given to you today.  If you need a refill on your cardiac medications before your next appointment, please call your pharmacy.   Lab work: None ordered  If you have labs (blood work) drawn today and your tests are completely normal, you will receive your results only by: Marland Kitchen MyChart Message (if you have MyChart) OR . A paper copy in the mail If you have any lab test that is abnormal or we need to change your treatment, we will call you to review the results.  Testing/Procedures: None ordered  Follow-Up: At [facility], you and your health needs are our priority.  As part of our continuing mission to provide you with exceptional heart care, we have created designated Provider Care Teams.  These Care Teams include your primary Cardiologist (physician) and Advanced Practice Providers (APPs -  Physician Assistants and Nurse Practitioners) who all work together to provide you with the care you need, when you need it. You will need a follow up appointment in 1 years.  Please call our office 2 months in advance to schedule this appointment.  You may see No primary care provider on file. or one of the following Advanced Practice Providers on your designated Care Team:   Truitt Merle, NP Cecilie Kicks, NP . Kathyrn Drown, NP  Any Other Special Instructions Will Be Listed Below (If Applicable). [place] Weight # [phone] Or you can participate in Weight Watchers.

## 2018-05-26 NOTE — Telephone Encounter (Signed)
oxyCODONE-acetaminophen (PERCOCET) 10-325 MG tablet   , REFILL REQUEST @  [facility] [place], [place] [phone] (Phone) [phone] (Fax)

## 2018-05-27 MED ORDER — OXYCODONE-ACETAMINOPHEN 10-325 MG PO TABS: 1.0000 | ORAL_TABLET | Freq: Four times a day (QID) | ORAL | 0 refills | Status: DC | PRN

## 2018-05-27 NOTE — Telephone Encounter (Signed)
Received phone call from pt, she is asking for a refill for her oxycodone.  Informed pt she needs to have an appointment with pcp for additional refills.  Appointment w/ pcp made for 12/19. [place], RN

## 2018-05-27 NOTE — Telephone Encounter (Signed)
Refilled Oxycodone to [place], [place] PMP database (including [place])- appropriate

## 2018-06-10 ENCOUNTER — Ambulatory Visit: Payer: Medicare Other

## 2018-06-11 ENCOUNTER — Other Ambulatory Visit: Payer: Self-pay | Admitting: *Deleted

## 2018-06-11 MED ORDER — ROSUVASTATIN CALCIUM 5 MG PO TABS: 5.0000 mg | ORAL_TABLET | Freq: Every day | ORAL | 3 refills | Status: DC

## 2018-06-30 ENCOUNTER — Other Ambulatory Visit: Payer: Self-pay

## 2018-06-30 NOTE — Telephone Encounter (Signed)
diclofenac sodium (VOLTAREN) 1 % GEL,  oxyCODONE-acetaminophen (PERCOCET) 10-325 MG tablet, REFILL REQUEST @  [facility] [address], [place] [phone] (Phone) [phone] (Fax)

## 2018-07-02 MED ORDER — OXYCODONE-ACETAMINOPHEN 10-325 MG PO TABS: 1.0000 | ORAL_TABLET | Freq: Four times a day (QID) | ORAL | 0 refills | Status: AC | PRN

## 2018-07-02 MED ORDER — DICLOFENAC SODIUM 1 % TD GEL: 2.0000 g | Freq: Four times a day (QID) | TRANSDERMAL | 5 refills | Status: DC | PRN

## 2018-07-08 ENCOUNTER — Other Ambulatory Visit: Payer: Self-pay

## 2018-07-08 ENCOUNTER — Ambulatory Visit (INDEPENDENT_AMBULATORY_CARE_PROVIDER_SITE_OTHER): Payer: Medicare Other | Admitting: Internal Medicine

## 2018-07-08 ENCOUNTER — Encounter: Payer: Self-pay | Admitting: Internal Medicine

## 2018-07-08 VITALS — BP 128/69 | HR 71 | Ht 67.0 in | Wt 320.8 lb

## 2018-07-08 DIAGNOSIS — G894 Chronic pain syndrome: Secondary | ICD-10-CM

## 2018-07-08 DIAGNOSIS — I10 Essential (primary) hypertension: Secondary | ICD-10-CM

## 2018-07-08 DIAGNOSIS — G4733 Obstructive sleep apnea (adult) (pediatric): Secondary | ICD-10-CM | POA: Diagnosis not present

## 2018-07-08 DIAGNOSIS — Z79899 Other long term (current) drug therapy: Secondary | ICD-10-CM

## 2018-07-08 DIAGNOSIS — Z6841 Body Mass Index (BMI) 40.0 and over, adult: Secondary | ICD-10-CM

## 2018-07-08 DIAGNOSIS — G8929 Other chronic pain: Secondary | ICD-10-CM

## 2018-07-08 DIAGNOSIS — M1711 Unilateral primary osteoarthritis, right knee: Secondary | ICD-10-CM

## 2018-07-08 DIAGNOSIS — Z79891 Long term (current) use of opiate analgesic: Secondary | ICD-10-CM

## 2018-07-08 MED ORDER — LIRAGLUTIDE -WEIGHT MANAGEMENT 18 MG/3ML ~~LOC~~ SOPN: PEN_INJECTOR | SUBCUTANEOUS | 1 refills | Status: DC

## 2018-07-08 MED ORDER — HYDROCODONE-ACETAMINOPHEN 10-325 MG PO TABS: 1.0000 | ORAL_TABLET | Freq: Four times a day (QID) | ORAL | 0 refills | Status: DC | PRN

## 2018-07-08 MED ORDER — INSULIN PEN NEEDLE 32G X 6 MM MISC: 1.0000 [IU] | Freq: Every day | 1 refills | Status: DC

## 2018-07-08 MED ORDER — HYDROCODONE-ACETAMINOPHEN 10-325 MG PO TABS: 1.0000 | ORAL_TABLET | Freq: Four times a day (QID) | ORAL | 0 refills | Status: AC | PRN

## 2018-07-08 NOTE — Patient Instructions (Signed)
Call with any questions about saxenda  Please get back on your CPAP!

## 2018-07-08 NOTE — Assessment & Plan Note (Signed)
She has a history of severe obstructive sleep apnea with AHI of 133 I cannot see our specific CPAP titration in the EMR.  She does not know her settings.  She reports that her mask does not fit well and this is part of the reason for her nonadherence to her CPAP therapy.  With her morbid obesity I think it is very important for her to be on CPAP therapy.  I would like her to obtain a new mask and any supplies that are needed.

## 2018-07-08 NOTE — Assessment & Plan Note (Signed)
HPI: She continues to have chronic pain predominantly in her left shoulder and right knee.  She has seen orthopedic surgery for her knee however she needs to lose a significant amount of weight before a knee replacement would even be considered and she remains very apprehensive about surgical intervention.  She reports the oxycodone 10-3 25 mg pills are helping her to remain functional and ambulate.  However she wonders if they are less effective than her previous hydrocodone.  100 pills continues to last her 1 month.  Assessment chronic pain syndrome on chronic opioid therapy.  Plan -Recent UDS appropriate, [place] and [place] controlled substance database checked and appropriate, no other red flags -I will change her back to hydrocodone APAP 10-3 25 same number of pills to be filled on her current prescription of oxycodone finishes.  She may have some additional analgesic benefit from switching opioid medications.  Also working on weight loss as noted

## 2018-07-08 NOTE — Assessment & Plan Note (Signed)
Well-controlled continue lisinopril 20 mg daily carvedilol 6.25 mg twice daily and Lasix 40 mg daily

## 2018-07-08 NOTE — Progress Notes (Signed)
Subjective:  HPI: [name] is a 57 y.o. female who presents for chronic pain, obesity  Please see Assessment and Plan below for the status of her chronic medical problems.  Review of Systems: Review of Systems  Constitutional: Positive for malaise/fatigue.  Cardiovascular: Negative for chest pain and orthopnea.  Musculoskeletal: Positive for back pain and joint pain. Negative for myalgias.    Objective:  Physical Exam: Vitals:   07/08/18 1038  BP: 128/69  Pulse: 71  SpO2: 100%  Weight: (!) 320 lb 12.8 oz (145.5 kg)  Height: 5\' 7"  (1.702 m)   Body mass index is 50.24 kg/m. Physical Exam Vitals signs and nursing note reviewed.  Constitutional:      Appearance: Normal appearance. She is obese.  Neck:     Musculoskeletal: Normal range of motion and neck supple.  Cardiovascular:     Rate and Rhythm: Normal rate and regular rhythm.  Musculoskeletal:     Right knee: She exhibits no effusion. Tenderness found. Medial joint line and lateral joint line tenderness noted.     Left knee: She exhibits no effusion.  Neurological:     Mental Status: She is alert.    Assessment & Plan:  Morbid obesity with BMI of 50.0-59.9, adult (HCC) Wt Readings from Last 5 Encounters:  07/08/18 (!) 320 lb 12.8 oz (145.5 kg)  05/25/18 (!) 318 lb (144.2 kg)  05/03/18 (!) 317 lb (143.8 kg)  02/25/18 (!) 308 lb 8 oz (139.9 kg)  01/25/18 (!) 318 lb 14.4 oz (144.7 kg)   HPI: Again she has had steady weight gain.  She reports that she has been trying to diet and has been frustrated with her lack of results.  However in discussing her diet she frequently snaps on peanut butter crackers and may be doing some things that are less beneficial.  I discussed avoidance of carbohydrates and eating low calorie meals.  Assessment morbid obesity with multiple obesity related medical conditions.  Including hypertension, hyperlipidemia, osteoarthritis  Plan I discussed with her I think that we should  try medical therapy to assist. I provided her with a sample as well as a pharmacy benefit card for Saxenda.  I discussed the dosing of this to start at 0.6 mg and titrate up on a weekly basis as directed. Additionally I discussed with her the importance of getting back to regular CPAP use as this will help with her weight loss.  Chronic pain HPI: She continues to have chronic pain predominantly in her left shoulder and right knee.  She has seen orthopedic surgery for her knee however she needs to lose a significant amount of weight before a knee replacement would even be considered and she remains very apprehensive about surgical intervention.  She reports the oxycodone 10-3 25 mg pills are helping her to remain functional and ambulate.  However she wonders if they are less effective than her previous hydrocodone.  100 pills continues to last her 1 month.  Assessment chronic pain syndrome on chronic opioid therapy.  Plan -Recent UDS appropriate, [place] and [place] controlled substance database checked and appropriate, no other red flags -I will change her back to hydrocodone APAP 10-3 25 same number of pills to be filled on her current prescription of oxycodone finishes.  She may have some additional analgesic benefit from switching opioid medications.  Also working on weight loss as noted  Severe obstructive sleep apnea She has a history of severe obstructive sleep apnea with AHI of 133 I cannot  see our specific CPAP titration in the EMR.  She does not know her settings.  She reports that her mask does not fit well and this is part of the reason for her nonadherence to her CPAP therapy.  With her morbid obesity I think it is very important for her to be on CPAP therapy.  I would like her to obtain a new mask and any supplies that are needed.  Essential hypertension, benign Well-controlled continue lisinopril 20 mg daily carvedilol 6.25 mg twice daily and Lasix 40 mg daily   Medications  Ordered Meds ordered this encounter  Medications  . Liraglutide -Weight Management (SAXENDA) 18 MG/3ML SOPN    Sig: Inject 1.8 mg into the skin daily for 4 days, THEN 2.4 mg daily for 7 days, THEN 3 mg daily.    Dispense:  5 pen    Refill:  1  . Insulin Pen Needle (NOVOFINE) 32G X 6 MM MISC    Sig: 1 Units by Does not apply route daily.    Dispense:  100 each    Refill:  1    For use with Saxenda  . HYDROcodone-acetaminophen (NORCO) 10-325 MG tablet    Sig: Take 1 tablet by mouth every 6 (six) hours as needed.    Dispense:  100 tablet    Refill:  0    Change in therapy from Oxycodone to Hydrocodone. Rx 1/2  . HYDROcodone-acetaminophen (NORCO) 10-325 MG tablet    Sig: Take 1 tablet by mouth every 6 (six) hours as needed.    Dispense:  100 tablet    Refill:  0    Rx 2/2   Other Orders Orders Placed This Encounter  Procedures  . PR DME SUPPLY OR ACCESSORY, NOS    CPAP mask reassessment. Patient needs supplies, difficulty with nasal mask   Follow Up: Return in about 3 months (around 10/07/2018).

## 2018-07-08 NOTE — Assessment & Plan Note (Signed)
Wt Readings from Last 5 Encounters:  07/08/18 (!) 320 lb 12.8 oz (145.5 kg)  05/25/18 (!) 318 lb (144.2 kg)  05/03/18 (!) 317 lb (143.8 kg)  02/25/18 (!) 308 lb 8 oz (139.9 kg)  01/25/18 (!) 318 lb 14.4 oz (144.7 kg)   HPI: Again she has had steady weight gain.  She reports that she has been trying to diet and has been frustrated with her lack of results.  However in discussing her diet she frequently snaps on peanut butter crackers and may be doing some things that are less beneficial.  I discussed avoidance of carbohydrates and eating low calorie meals.  Assessment morbid obesity with multiple obesity related medical conditions.  Including hypertension, hyperlipidemia, osteoarthritis  Plan I discussed with her I think that we should try medical therapy to assist. I provided her with a sample as well as a pharmacy benefit card for Saxenda.  I discussed the dosing of this to start at 0.6 mg and titrate up on a weekly basis as directed. Additionally I discussed with her the importance of getting back to regular CPAP use as this will help with her weight loss.

## 2018-07-23 ENCOUNTER — Telehealth: Payer: Self-pay | Admitting: *Deleted

## 2018-07-23 NOTE — Telephone Encounter (Signed)
Call made to [facility] placed order for CPAP supplies and CPAP mask reassessment, per [facility], pt will have to call them directly and speak to the respiratory dept and let them know what's going on with her mask and order supplies.  Pt was contacted and is agreeable to contacting [facility] (ph# [phone]), also stressed the importance of using CPAP machine.    Pt will contact [facility] next week and contact me directly if she has any problems.Despina Hidden Cassady1/3/20204:26 PM

## 2018-07-26 NOTE — Telephone Encounter (Signed)
Thank you Ulis Rias

## 2018-08-11 ENCOUNTER — Telehealth: Payer: Self-pay

## 2018-08-11 NOTE — Telephone Encounter (Signed)
Liraglutide -Weight Management (SAXENDA) 18 MG/3ML SOPN, is not covered by the insurance. Please call pt back.

## 2018-08-12 ENCOUNTER — Telehealth: Payer: Self-pay | Admitting: *Deleted

## 2018-08-12 NOTE — Telephone Encounter (Signed)
PA was done.

## 2018-08-12 NOTE — Telephone Encounter (Addendum)
Information was sent for PA for Saxenda.  Awaiting decision withon 72 hours.  Sander Nephew, RN 08/12/2018.  Sander Nephew, RN 08/12/2018 11:07 AM. Letter from [employer] does not cover weight loss medications.  Message sent to Dr. Angelia Mould.  Sander Nephew, RN 08/19/2018 9:06 AM

## 2018-08-19 NOTE — Telephone Encounter (Signed)
Hi Dr. [name] [place], would you be ok with me trying Victoza for weight loss? I have called the manufacturer and confirmed it is the exact same ingredient as Saxenda. Another option is to give patient Saxenda samples (thank you for your help keeping it in stock). Please let me know your preference.  Thank you!

## 2018-08-19 NOTE — Telephone Encounter (Signed)
That is unfortunate, ill add Dr Maudie Mercury to see if she has any additional thoughts

## 2018-08-20 ENCOUNTER — Telehealth: Payer: Self-pay | Admitting: Internal Medicine

## 2018-08-20 NOTE — Telephone Encounter (Signed)
Patient called back requesting sample of saxenda (Victoza) be given to her sister Berta Minor as she has appt today in clinic. Reviewed Dr. Julianne Rice note from yesterday in phone encounter from 08/12/2018. Will route to PCP. Hubbard Hartshorn, RN, BSN

## 2018-08-20 NOTE — Telephone Encounter (Signed)
I did give her one pen sample that we had for saxenda, although I worry that wont be sustainable.

## 2018-08-20 NOTE — Telephone Encounter (Signed)
Discussed with PCP. He is fine with giving sample and he handed sample to patient's sister, Berta Minor. Patient notified and is very Patent attorney. Hubbard Hartshorn, RN, BSN   1 box Saxenda 18 mg/3 mL Lot # [id number] 11/2019 [place] [id number]

## 2018-08-20 NOTE — Telephone Encounter (Signed)
Pt would like to know if Dr [name] [place] have another sample of Liraglutide -Weight Management ([place]) 18 MG/3ML SOPN(Expired), pt  Contact [phone]

## 2018-08-23 ENCOUNTER — Other Ambulatory Visit: Payer: Self-pay | Admitting: Pharmacist

## 2018-08-23 DIAGNOSIS — Z6841 Body Mass Index (BMI) 40.0 and over, adult: Principal | ICD-10-CM

## 2018-08-23 MED ORDER — SEMAGLUTIDE(0.25 OR 0.5MG/DOS) 2 MG/1.5ML ~~LOC~~ SOPN: 0.2500 mg | PEN_INJECTOR | SUBCUTANEOUS | 0 refills | Status: DC

## 2018-08-23 NOTE — Progress Notes (Signed)
Spoke to PCP for approval and contacted [facility] to confirm Semaglutide coverage, pharmacy quoted $8 copay. Notified patient to switch to semaglutide once she has finished her supply of Saxenda, schedule follow up appointment with PCP. Patient repeated back for confirmation.

## 2018-09-17 DIAGNOSIS — Z7902 Long term (current) use of antithrombotics/antiplatelets: Secondary | ICD-10-CM | POA: Diagnosis not present

## 2018-09-17 DIAGNOSIS — Z86718 Personal history of other venous thrombosis and embolism: Secondary | ICD-10-CM | POA: Diagnosis not present

## 2018-09-17 DIAGNOSIS — R0602 Shortness of breath: Secondary | ICD-10-CM | POA: Diagnosis not present

## 2018-09-17 DIAGNOSIS — Z86711 Personal history of pulmonary embolism: Secondary | ICD-10-CM | POA: Diagnosis not present

## 2018-09-17 DIAGNOSIS — Z7982 Long term (current) use of aspirin: Secondary | ICD-10-CM | POA: Diagnosis not present

## 2018-09-17 DIAGNOSIS — M79605 Pain in left leg: Secondary | ICD-10-CM | POA: Diagnosis not present

## 2018-09-17 DIAGNOSIS — I509 Heart failure, unspecified: Secondary | ICD-10-CM | POA: Diagnosis not present

## 2018-09-17 DIAGNOSIS — R0789 Other chest pain: Secondary | ICD-10-CM | POA: Diagnosis not present

## 2018-09-17 DIAGNOSIS — Z79899 Other long term (current) drug therapy: Secondary | ICD-10-CM | POA: Diagnosis not present

## 2018-09-20 DIAGNOSIS — M79605 Pain in left leg: Secondary | ICD-10-CM | POA: Diagnosis not present

## 2018-09-20 DIAGNOSIS — M79604 Pain in right leg: Secondary | ICD-10-CM | POA: Diagnosis not present

## 2018-09-20 DIAGNOSIS — M7989 Other specified soft tissue disorders: Secondary | ICD-10-CM | POA: Diagnosis not present

## 2018-09-28 ENCOUNTER — Other Ambulatory Visit: Payer: Self-pay

## 2018-09-28 MED ORDER — HYDROCODONE-ACETAMINOPHEN 10-325 MG PO TABS: 1.0000 | ORAL_TABLET | Freq: Four times a day (QID) | ORAL | 0 refills | Status: DC | PRN

## 2018-09-28 NOTE — Telephone Encounter (Signed)
HYDROcodone-acetaminophen (NORCO) 10-325 MG tablet, REFILL REQUEST @  [facility] [place], [place] [phone] (Phone) [phone] (Fax)

## 2018-09-30 ENCOUNTER — Other Ambulatory Visit: Payer: Self-pay

## 2018-09-30 ENCOUNTER — Ambulatory Visit (INDEPENDENT_AMBULATORY_CARE_PROVIDER_SITE_OTHER): Payer: Medicare Other | Admitting: Internal Medicine

## 2018-09-30 ENCOUNTER — Encounter: Payer: Self-pay | Admitting: Internal Medicine

## 2018-09-30 ENCOUNTER — Encounter (INDEPENDENT_AMBULATORY_CARE_PROVIDER_SITE_OTHER): Payer: Self-pay

## 2018-09-30 VITALS — BP 166/77 | HR 64 | Temp 97.6°F | Ht 67.0 in | Wt 320.7 lb

## 2018-09-30 DIAGNOSIS — I110 Hypertensive heart disease with heart failure: Secondary | ICD-10-CM

## 2018-09-30 DIAGNOSIS — R7303 Prediabetes: Secondary | ICD-10-CM | POA: Diagnosis not present

## 2018-09-30 DIAGNOSIS — I5042 Chronic combined systolic (congestive) and diastolic (congestive) heart failure: Secondary | ICD-10-CM

## 2018-09-30 DIAGNOSIS — I10 Essential (primary) hypertension: Secondary | ICD-10-CM | POA: Diagnosis not present

## 2018-09-30 DIAGNOSIS — Z79899 Other long term (current) drug therapy: Secondary | ICD-10-CM

## 2018-09-30 DIAGNOSIS — M1711 Unilateral primary osteoarthritis, right knee: Secondary | ICD-10-CM | POA: Diagnosis not present

## 2018-09-30 DIAGNOSIS — Z79891 Long term (current) use of opiate analgesic: Secondary | ICD-10-CM

## 2018-09-30 DIAGNOSIS — G8929 Other chronic pain: Secondary | ICD-10-CM

## 2018-09-30 DIAGNOSIS — E7849 Other hyperlipidemia: Secondary | ICD-10-CM

## 2018-09-30 DIAGNOSIS — Z6841 Body Mass Index (BMI) 40.0 and over, adult: Secondary | ICD-10-CM

## 2018-09-30 DIAGNOSIS — I5022 Chronic systolic (congestive) heart failure: Secondary | ICD-10-CM

## 2018-09-30 DIAGNOSIS — R5383 Other fatigue: Secondary | ICD-10-CM

## 2018-09-30 DIAGNOSIS — G4733 Obstructive sleep apnea (adult) (pediatric): Secondary | ICD-10-CM

## 2018-09-30 DIAGNOSIS — R5382 Chronic fatigue, unspecified: Secondary | ICD-10-CM

## 2018-10-01 DIAGNOSIS — R5382 Chronic fatigue, unspecified: Secondary | ICD-10-CM | POA: Insufficient documentation

## 2018-10-01 LAB — LIPID PANEL
CHOLESTEROL TOTAL: 155 mg/dL (ref 100–199)
Chol/HDL Ratio: 3.6 ratio (ref 0.0–4.4)
HDL: 43 mg/dL (ref >39)
LDL Calculated: 95 mg/dL (ref 0–99)
Triglycerides: 83 mg/dL (ref 0–149)
VLDL Cholesterol Cal: 17 mg/dL (ref 5–40)

## 2018-10-01 LAB — HEMOGLOBIN A1C
Est. average glucose Bld gHb Est-mCnc: 128 mg/dL
Hgb A1c MFr Bld: 6.1 % — ABNORMAL HIGH (ref 4.8–5.6)

## 2018-10-01 LAB — TSH: TSH: 0.798 u[IU]/mL (ref 0.450–4.500)

## 2018-10-01 MED ORDER — HYDROCODONE-ACETAMINOPHEN 10-325 MG PO TABS: 1.0000 | ORAL_TABLET | Freq: Four times a day (QID) | ORAL | 0 refills | Status: DC | PRN

## 2018-10-01 MED ORDER — LISINOPRIL 40 MG PO TABS: 40.0000 mg | ORAL_TABLET | Freq: Every day | ORAL | 3 refills | Status: DC

## 2018-10-01 NOTE — Progress Notes (Signed)
Subjective:  HPI: [name] is a 58 y.o. female who presents for chronic pain, obesity  Please see Assessment and Plan below for the status of her chronic medical problems.  Review of Systems: Review of Systems  Constitutional: Positive for malaise/fatigue.  Cardiovascular: Negative for chest pain and orthopnea.  Musculoskeletal: Positive for back pain and joint pain. Negative for myalgias.    Objective:  Physical Exam: Vitals:   09/30/18 1009  BP: (!) 166/77  Pulse: 64  Temp: 97.6 F (36.4 C)  TempSrc: Oral  SpO2: 92%  Weight: (!) 320 lb 11.2 oz (145.5 kg)  Height: 5\' 7"  (1.702 m)   Body mass index is 50.23 kg/m. Physical Exam Vitals signs and nursing note reviewed.  Constitutional:      Appearance: Normal appearance. She is obese.  Neck:     Musculoskeletal: Normal range of motion and neck supple.  Cardiovascular:     Rate and Rhythm: Normal rate and regular rhythm.  Musculoskeletal:     Right knee: She exhibits no effusion. Tenderness found. Medial joint line and lateral joint line tenderness noted.     Left knee: She exhibits no effusion.  Neurological:     Mental Status: She is alert.    Assessment & Plan:  Essential hypertension, benign HPI: Did not take blood pressure medications yet today takes them with me not has not yet eaten.  Otherwise denies any current issues  Assessment essential hypertension above goal  Plan Increase lisinopril from 20 mg to 40 mg daily. Continue carvedilol 6.25 mg twice daily Continue Lasix 40 mg daily  CHF NYHA class II (symptoms with moderately strenuous activities) HPI: Has gained weight recently, however no increased shortness of breath no increased orthopnea no increased lower extremity edema.  Assessment chronic combined congestive heart failure  Plan  Continue Lasix continue monitoring weight.  Working on better blood pressure control better CPAP compliance.   Severe obstructive sleep apnea Patient  reports she has been using her CPAP with more regularity.  At times like she still has an outstanding balance to [facility] but has been using her a friend's mask.  I discussed with her expect her to use a mask scheduled for her and that I think OSA is a large contributor to her multiple medical problems.  Primary osteoarthritis of right knee HPI Patient requesting steroid injection today for her right knee pain.  She reports last steroid injection helped for at least 4 months.  Assessment right primary osteoarthritis  Plan Steroid Injection today    Morbid obesity with BMI of 50.0-59.9, adult (HCC) HPI: Has been taking Ozempic once weekly for weight loss.  Has had to use samples as insurance does not cover this medication.  Initially there was some improvement but recently weight has gone up.  She has been intermittently adherent sometimes she forgets to take medication and will skip a week.  Assessment morbid obesity  Plan We will try to continue Ozempic weight loss remains paramount for her and unfortunately she is too fearful of bariatric surgery.  I discussed importance of treatment of OSA today.  Long term (current) use of opiate analgesic HPI: She continues to take hydrocodone acetaminophen for her chronic right shoulder right knee and low back pain.  Overall she reports this continues to help she has had no adverse effects no constipation.  She notes that overall it helps decrease pain and up to be somewhat functional however she never has complete pain relief.  Assessment long-term  use of opioid analgesics for chronic shoulder back and knee pain related to osteoarthritis/morbid obesity  Plan Refilled hydrocodone for 2 additional months we will have her back in about 3 months when she should be due.  We will continue with adjunctive therapy with steroid injections.  We may try to do some physical therapy in the near future to see if she can get some additional benefit from  this.  Prediabetes Recheck A1c>> remains in prediabetic range at 6.1%  Chronic fatigue Continues to complain of fatigue I suspect this is most related to her obstructive sleep apnea and morbid obesity and address these issues today.  She is requesting another TSH check last was 1 year ago.  Have done this along with an A1c   Medications Ordered Meds ordered this encounter  Medications  . lisinopril (PRINIVIL,ZESTRIL) 40 MG tablet    Sig: Take 1 tablet (40 mg total) by mouth daily.    Dispense:  90 tablet    Refill:  3    Dose change, place on file  . HYDROcodone-acetaminophen (NORCO) 10-325 MG tablet    Sig: Take 1 tablet by mouth every 6 (six) hours as needed for up to 30 days for severe pain.    Dispense:  100 tablet    Refill:  0    Rx 1/2  . HYDROcodone-acetaminophen (NORCO) 10-325 MG tablet    Sig: Take 1 tablet by mouth every 6 (six) hours as needed for up to 30 days for severe pain.    Dispense:  100 tablet    Refill:  0    Rx 2/2   Other Orders Orders Placed This Encounter  Procedures  . Hemoglobin A1c  . Lipid Profile  . TSH   Follow Up: Return in about 3 months (around 12/31/2018).  PROCEDURE NOTE  PROCEDURE: right knee joint steroid injection.  PREOPERATIVE DIAGNOSIS: Osteoarthritis of the right knee.  POSTOPERATIVE DIAGNOSIS: Osteoarthritis of the right knee.  PROCEDURE: The patient was apprised of the risks and the benefits of the procedure and informed consent was obtained, as witnessed by Lela. Time-out procedure was performed, with confirmation of the patient's name, date of birth, and correct identification of the right knee to be injected. The patient's knee was then marked at the appropriate site for injection placement. The knee was sterilely prepped with Betadine. A 40 mg (1 milliliter) solution of Kenalog was drawn up into a 5 mL syringe with a 2 mL of 1% lidocaine. The patient was injected with a 25-gauge needle at the medial aspect of her right  flexed knee. There were no complications. The patient tolerated the procedure well. There was minimal bleeding. The patient was instructed to ice her knee upon leaving clinic and refrain from overuse over the next 3 days. The patient was instructed to go to the emergency room with any usual pain, swelling, or redness occurred in the injected area. The patient was given a followup appointment to evaluate response to the injection to his increased range of motion and reduction of pain.

## 2018-10-01 NOTE — Assessment & Plan Note (Signed)
Recheck A1c>> remains in prediabetic range at 6.1%

## 2018-10-01 NOTE — Assessment & Plan Note (Signed)
HPI Patient requesting steroid injection today for her right knee pain.  She reports last steroid injection helped for at least 4 months.  Assessment right primary osteoarthritis  Plan Steroid Injection today

## 2018-10-01 NOTE — Assessment & Plan Note (Signed)
HPI: Did not take blood pressure medications yet today takes them with me not has not yet eaten.  Otherwise denies any current issues  Assessment essential hypertension above goal  Plan Increase lisinopril from 20 mg to 40 mg daily. Continue carvedilol 6.25 mg twice daily Continue Lasix 40 mg daily

## 2018-10-01 NOTE — Assessment & Plan Note (Signed)
Patient reports she has been using her CPAP with more regularity.  At times like she still has an outstanding balance to [facility] but has been using her a friend's mask.  I discussed with her expect her to use a mask scheduled for her and that I think OSA is a large contributor to her multiple medical problems.

## 2018-10-01 NOTE — Assessment & Plan Note (Signed)
HPI: Has gained weight recently, however no increased shortness of breath no increased orthopnea no increased lower extremity edema.  Assessment chronic combined congestive heart failure  Plan  Continue Lasix continue monitoring weight.  Working on better blood pressure control better CPAP compliance.

## 2018-10-01 NOTE — Assessment & Plan Note (Signed)
HPI: Has been taking Ozempic once weekly for weight loss.  Has had to use samples as insurance does not cover this medication.  Initially there was some improvement but recently weight has gone up.  She has been intermittently adherent sometimes she forgets to take medication and will skip a week.  Assessment morbid obesity  Plan We will try to continue Ozempic weight loss remains paramount for her and unfortunately she is too fearful of bariatric surgery.  I discussed importance of treatment of OSA today.

## 2018-10-01 NOTE — Assessment & Plan Note (Signed)
Continues to complain of fatigue I suspect this is most related to her obstructive sleep apnea and morbid obesity and address these issues today.  She is requesting another TSH check last was 1 year ago.  Have done this along with an A1c

## 2018-10-01 NOTE — Assessment & Plan Note (Signed)
HPI: She continues to take hydrocodone acetaminophen for her chronic right shoulder right knee and low back pain.  Overall she reports this continues to help she has had no adverse effects no constipation.  She notes that overall it helps decrease pain and up to be somewhat functional however she never has complete pain relief.  Assessment long-term use of opioid analgesics for chronic shoulder back and knee pain related to osteoarthritis/morbid obesity  Plan Refilled hydrocodone for 2 additional months we will have her back in about 3 months when she should be due.  We will continue with adjunctive therapy with steroid injections.  We may try to do some physical therapy in the near future to see if she can get some additional benefit from this.

## 2018-10-07 ENCOUNTER — Encounter: Payer: Medicare Other | Admitting: Internal Medicine

## 2018-10-19 ENCOUNTER — Telehealth: Payer: Self-pay | Admitting: Internal Medicine

## 2018-10-19 NOTE — Telephone Encounter (Signed)
Pt c/o left leg pain-hx DVT-was seen in ED in [place] about 3-4 weeks ago and per pt,they r/o DVT. Pt asking to have her "potassium checked" pain 8/10 sometimes a 10, no swelling, just hurts to get up at times. Will send to pcp to see if this warrants a telehealth visit or an appt for lab only.  Please advise.Despina Hidden Cassady3/31/20204:12 PM

## 2018-10-19 NOTE — Telephone Encounter (Signed)
Pt requesting a nurse to callback [phone]

## 2018-10-20 ENCOUNTER — Ambulatory Visit (INDEPENDENT_AMBULATORY_CARE_PROVIDER_SITE_OTHER): Payer: Medicare Other | Admitting: Internal Medicine

## 2018-10-20 ENCOUNTER — Encounter: Payer: Self-pay | Admitting: Internal Medicine

## 2018-10-20 ENCOUNTER — Other Ambulatory Visit: Payer: Self-pay

## 2018-10-20 DIAGNOSIS — G5712 Meralgia paresthetica, left lower limb: Secondary | ICD-10-CM | POA: Diagnosis not present

## 2018-10-20 DIAGNOSIS — G5710 Meralgia paresthetica, unspecified lower limb: Secondary | ICD-10-CM | POA: Insufficient documentation

## 2018-10-20 MED ORDER — GABAPENTIN 100 MG PO CAPS: ORAL_CAPSULE | ORAL | 0 refills | Status: DC

## 2018-10-20 NOTE — Progress Notes (Signed)
.     [facility] Telephone Encounter  Reason for call:   This telephone encounter was created for [name] on 10/20/2018 for the following purpose/cc meralgia paresthetica.   Pertinent Data:   Recent evaluation at [facility] ED with neg CT scan and neg duplex DVT r/o 3wks ago  On Xarelto lifelong for DVT's PE    Assessment / Plan / Recommendations:   A: Meralgia Parasthetica: Over one month has had pain on anterior thigh from the middle of the thigh and up the thigh.  She denies redness, asymettry or any visual change in her leg whatsoever.  Can't walk well at times due to pain.  Checked for DVT and had a CT scan at [facility] which were negative was about 3-4 weeks ago.  Describes the pain as a dullness and numbness at times.  Hurts in certain positions when seated if she adjusts right the pain will go away.  Mainly hurts when stretched out far or scrunched up in chair. No pain or numbness in feet no reason to suspect poor blood flow.  Symptoms not consistent with claudication.  Her symptoms and clinical history are consistent most with meralgia paresthetica  P: Encouraged weight loss, loosefitting clothing, no belts  Ordered a trial of gabapentin she can titrate up discussed this titration over the phone and ordered a ramp-up prescription.    As always, pt is advised that if symptoms worsen or new symptoms arise, they should go to an urgent care facility or to to ER for further evaluation.   Consent and Medical Decision Making:   Patient discussed with Dr. Lynnae January  This is a telephone encounter between Breanna Wilkins and Breanna Wilkins on 10/20/2018 for meralgia paresthetica. The visit was conducted with the patient located at home and Breanna Wilkins at [facility]. The patient's identity was confirmed using their DOB and current address. The patient has consented to being evaluated through a telephone encounter and understands the associated risks (an  examination cannot be done and the patient may need to come in for an appointment) / benefits (allows the patient to remain at home, decreasing exposure to coronavirus). I personally spent 20 minutes on medical discussion.

## 2018-10-20 NOTE — Assessment & Plan Note (Signed)
   A: Meralgia Parasthetica: Over one month has had pain on anterior thigh from the middle of the thigh and up the thigh.  She denies redness, asymettry or any visual change in her leg whatsoever.  Can't walk well at times due to pain.  Checked for DVT and had a CT scan at [facility] which were negative was about 3-4 weeks ago.  Describes the pain as a dullness and numbness at times.  Hurts in certain positions when seated if she adjusts right the pain will go away.  Mainly hurts when stretched out far or scrunched up in chair. No pain or numbness in feet no reason to suspect poor blood flow.  Symptoms not consistent with claudication.  Her symptoms and clinical history are consistent most with meralgia paresthetica  P: Encouraged weight loss, loosefitting clothing, no belts  Ordered a trial of gabapentin she can titrate up discussed this titration over the phone and ordered a ramp-up prescription.

## 2018-10-20 NOTE — Telephone Encounter (Signed)
telehealth visit would be fine given the pain seems pretty severe.

## 2018-10-22 NOTE — Progress Notes (Signed)
Internal Medicine Clinic Attending  Case discussed with Dr. Winfrey  at the time of the visit.  We reviewed the resident's history and exam and pertinent patient test results.  I agree with the assessment, diagnosis, and plan of care documented in the resident's note.  

## 2018-10-26 ENCOUNTER — Telehealth: Payer: Self-pay

## 2018-10-26 NOTE — Telephone Encounter (Signed)
She can increase the dosage ahead of time if she wishes.  Thanks!

## 2018-10-26 NOTE — Telephone Encounter (Signed)
Pt states she is supposed to increase the gabapentin on the 9th but would like to go ahead and move up today, is this acceptable? You may call her at [phone] or advise and triage will call her

## 2018-10-26 NOTE — Telephone Encounter (Signed)
Called pt, gave her dr winfrey's message

## 2018-10-26 NOTE — Telephone Encounter (Signed)
Requesting to speak with a nurse about gabapentin (NEURONTIN) 100 MG capsule. Please call back.

## 2018-11-09 ENCOUNTER — Telehealth: Payer: Self-pay

## 2018-11-09 NOTE — Telephone Encounter (Signed)
I gave Breanna Wilkins a call, she has been taking 5x 100mg  gabapentin caps TID for the last few days, not really helping, she does have some thigh pain but this sounds like it is more below the knee and with walking, I am more concerned with possible pes anserine burisitis versus inflammation of the tibial turburcity, I dont think I can eval this further over the phone, she could come by for an appointment tomorrow. If it is scheduled late in the day 2-4pm I could see her or co-see her with a resident down in the clinic and possibly give a steroid injection.  Bonnita Nasuti could you call her an see about setting up an appointment in the afternoon tomorrow.

## 2018-11-09 NOTE — Telephone Encounter (Signed)
Pt states gabapentin (NEURONTIN) 100 MG capsule is not working, still having leg pain. Please call back.

## 2018-11-09 NOTE — Telephone Encounter (Signed)
Called pt, she can come in tomorrow between 2 and 4, will send to [place]oriss. For scheduling and call pt by 1130 so she can make trip from [place].

## 2018-11-10 ENCOUNTER — Ambulatory Visit
Admission: RE | Admit: 2018-11-10 | Discharge: 2018-11-10 | Disposition: A | Discharge status: Home / Self Care | Payer: Medicare Other | Source: Ambulatory Visit | Attending: Oncology | Admitting: Oncology

## 2018-11-10 ENCOUNTER — Ambulatory Visit (INDEPENDENT_AMBULATORY_CARE_PROVIDER_SITE_OTHER): Payer: Medicare Other | Admitting: Internal Medicine

## 2018-11-10 ENCOUNTER — Other Ambulatory Visit: Payer: Self-pay

## 2018-11-10 VITALS — BP 126/66 | HR 75 | Temp 97.0°F | Wt 323.3 lb

## 2018-11-10 DIAGNOSIS — M5432 Sciatica, left side: Secondary | ICD-10-CM | POA: Diagnosis not present

## 2018-11-10 DIAGNOSIS — G8929 Other chronic pain: Secondary | ICD-10-CM | POA: Diagnosis not present

## 2018-11-10 DIAGNOSIS — Z79891 Long term (current) use of opiate analgesic: Secondary | ICD-10-CM | POA: Diagnosis not present

## 2018-11-10 MED ORDER — HYDROCODONE-ACETAMINOPHEN 10-325 MG PO TABS: 1.0000 | ORAL_TABLET | Freq: Four times a day (QID) | ORAL | 0 refills | Status: AC | PRN

## 2018-11-10 MED ORDER — GABAPENTIN 300 MG PO CAPS: ORAL_CAPSULE | ORAL | 1 refills | Status: DC

## 2018-11-10 MED ORDER — HYDROCODONE-ACETAMINOPHEN 10-325 MG PO TABS: 1.0000 | ORAL_TABLET | Freq: Four times a day (QID) | ORAL | 0 refills | Status: DC | PRN

## 2018-11-10 NOTE — Progress Notes (Signed)
Medicine attending: Medical history, presenting problems, physical findings, and medications, reviewed with resident physician Dr Ina Homes on the day of the patient visit and I concur with his evaluation and management plan. Morbidly obese woman. 1 month hx persistent left leg pain, radicular sxs without focal neuro deficit. On gabapentin 500 mg TID.  We will get plain X-Rays of L/S spine. MRI if indicated.

## 2018-11-10 NOTE — Assessment & Plan Note (Signed)
HPI:  Continues to take hydrocodone for chronic pain including the right shoulder, knee, and lower back. Overall helps her to function.   Review of the drug database with her last prescription filled on 4/9. Spoke with her PCP.  A/P: - Two additional months of hydrocodone send out. She will follow-up with her PCP in three months.

## 2018-11-10 NOTE — Progress Notes (Signed)
   CC: Left leg pain  HPI:  [name] is a 58 y.o. female with PMHx listed below presenting for left leg pain. Please see the A&P for the status of the patient's chronic medical problems.  Past Medical History:  Diagnosis Date  . Abdominal hernia   . Arthritis   . CAD (coronary artery disease)    Perioperative non-STEMI March, 2014  //   cardiac catheterization at time of non-STEMI, March, 2014, minor nonobstructive coronary disease, vigorous LV function, possibly vasospastic event versus stress cardiomyopathy. No further workup of coronary disease  . CHF (congestive heart failure) ([place])   . Diverticulosis of colon 10/17/2017   Noted on colonoscopy 10/14/17  . Ejection fraction    65-70%, vigorous function, echo, March, 2011  . Ejection fraction    Vigorous function at time of catheterization October 06, 2012,  . History of colonic polyps 10/17/2017   Colonoscopy 10/14/17  . Hyperlipidemia   . Hypertension   . Internal hemorrhoids without complication 9/93/7169   Noted on colonoscopy 10/14/17  . Lung nodule 12/2009   Very small, left upper lobe by CT June, 2011, one-year followup was stable  . Myocardial infarction ([place]) 09/2013  . OSA (obstructive sleep apnea)    CPAP  . Overweight(278.02)   . Pulmonary embolus ([place]) 01/16/10   on Coumadin indefinitely  . Shortness of breath   . Shoulder pain   . Sleep apnea   . Warfarin anticoagulation    Review of Systems:  Performed and all others negative.  Physical Exam: Vitals:   11/10/18 1432  BP: 126/66  Pulse: 75  Temp: (!) 97 F (36.1 C)  TempSrc: Oral  SpO2: 99%  Weight: (!) 323 lb 4.8 oz (146.6 kg)   General: Morbidly obese female in no acute distress Extremities: No tenderness to palpation of the LLE, no discomfort with internal/external rotation of the femoral head, no discomfort with active or passive range of motion of the left knee. Neuro: Alert and oriented x 3, unable to assess LE reflexes but patient gait is  normal, strength 5/5 in the LEs, negative straight leg test   Assessment & Plan:   See Encounters Tab for problem based charting.  Patient discussed with Dr. Beryle Beams

## 2018-11-10 NOTE — Assessment & Plan Note (Signed)
HPI:  Patient sending with three weeks of left leg pain. States that it starts out on the lateral anterior thigh radiating down to the top of the foot. She was recently started on gabapentin and is now taking 500 mg three times a day. This helps the pain a little bit. She has not noticed any weakness. She denies trauma to the leg or the back. She describes the pain as a burning sensation that waxes and wanes. She is not had any fevers, chills, or other signs of infection. She is up-to-date on all cancer screening. She is not had any changes in bladder or bowel function.  A/P: - Signs and symptoms consistent with sciatica - No red flag symptoms aside from age therefore we will pursue lumbar films. - Increase gabapentin to 600 mg in the morning and at lunch and 900 mg at night. Can increase to 900 mg three times daily if pain persists.

## 2018-11-10 NOTE — Telephone Encounter (Signed)
Per charsetta pt to be at clinic at 1430, called pt she will be to clinic at 1430

## 2018-11-10 NOTE — Patient Instructions (Signed)
Thank you for allowing [place] to provide your care. Today we are increasing your gabapentin. I would like you to take it as followed.  Take 600 mg (2 tablets) in the morning and at lunch. Take 900 mg (3 tablets) at bedtime.  Continue this for the next 1 to 2 weeks and if her pain does not improve please call us back. You can take up to 900 mg (3 tablets) three times daily.

## 2018-11-19 ENCOUNTER — Other Ambulatory Visit: Payer: Self-pay | Admitting: Internal Medicine

## 2018-11-19 ENCOUNTER — Other Ambulatory Visit: Payer: Self-pay

## 2018-11-19 ENCOUNTER — Other Ambulatory Visit: Payer: Self-pay | Admitting: *Deleted

## 2018-11-19 DIAGNOSIS — Z6841 Body Mass Index (BMI) 40.0 and over, adult: Principal | ICD-10-CM

## 2018-11-19 NOTE — Patient Outreach (Signed)
[facility]) Care Management  11/19/2018  IZAMAR LINDEN [birth year] [id number]   Medication Adherence call to Mrs. Janece Canterbury [facility] [employer] spoke with patient she is still using Ozempic and needs some patient ask if we can call doctors office to refill her prescription and send it to [facility] a message at [employer] office for them to send in a new prescription to [facility] doctors office will get in contact with patient. Mrs. Antrobus is showing past due under [place].   [place] Management Direct Dial [phone]  Fax [phone] [email or web address]

## 2018-11-19 NOTE — Telephone Encounter (Signed)
Per [facility] [phone]; requesting refill Ozempic injection   [place] request that the patient be notified [phone]

## 2018-11-19 NOTE — Telephone Encounter (Signed)
Request in new encounter

## 2018-11-23 ENCOUNTER — Telehealth: Payer: Self-pay | Admitting: Internal Medicine

## 2018-11-23 ENCOUNTER — Other Ambulatory Visit: Payer: Self-pay | Admitting: Internal Medicine

## 2018-11-23 MED ORDER — SEMAGLUTIDE(0.25 OR 0.5MG/DOS) 2 MG/1.5ML ~~LOC~~ SOPN: 0.2500 mg | PEN_INJECTOR | SUBCUTANEOUS | 3 refills | Status: DC

## 2018-11-23 NOTE — Telephone Encounter (Signed)
Refill Request- patient unsure of the name of the weight loss medication shot she takes.   Breanna Wilkins, [name]

## 2018-11-23 NOTE — Telephone Encounter (Signed)
Refill Request-Pt can not afford the medication that was prescribed as it was $250.00  [address]  Semaglutide,0.25 or 0.5MG /DOS, (OZEMPIC, 0.25 OR 0.5 MG/DOSE,) 2 MG/1.5ML SOPN

## 2018-11-23 NOTE — Telephone Encounter (Signed)
Called pt, gave her the name of the med and when it was sent to [facility]

## 2018-11-26 DIAGNOSIS — G4733 Obstructive sleep apnea (adult) (pediatric): Secondary | ICD-10-CM | POA: Diagnosis not present

## 2018-12-01 ENCOUNTER — Telehealth: Payer: Self-pay | Admitting: Internal Medicine

## 2018-12-01 NOTE — Telephone Encounter (Signed)
Return pt's call - no answer; informed pt to give [place] a call back.

## 2018-12-01 NOTE — Telephone Encounter (Signed)
Pt requesting to a call; pt contact [phone]

## 2018-12-09 ENCOUNTER — Telehealth: Payer: Self-pay | Admitting: Internal Medicine

## 2018-12-09 DIAGNOSIS — M5432 Sciatica, left side: Secondary | ICD-10-CM

## 2018-12-09 NOTE — Telephone Encounter (Signed)
Refill Request  gabapentin (NEURONTIN) 300 MG capsule    [facility] [place], [place]

## 2018-12-09 NOTE — Telephone Encounter (Signed)
Called pt - informed she should have 1 refill left. Stated she has been taking 900 mg three times a day instead 600 mg twice a day/900 mg at night. And 900 mg working better and does not make her sleepy.Please send new rx if appropriate. Thanks

## 2018-12-09 NOTE — Telephone Encounter (Signed)
Pt would like a call back from Breanna Wilkins about her shots and the cost  Semaglutide,0.25 or 0.5MG /DOS, (OZEMPIC, 0.25 OR 0.5 MG/DOSE,) 2 MG/1.5ML SOPN

## 2018-12-14 MED ORDER — GABAPENTIN 300 MG PO CAPS: 900.0000 mg | ORAL_CAPSULE | Freq: Three times a day (TID) | ORAL | 5 refills | Status: DC

## 2018-12-14 NOTE — Telephone Encounter (Signed)
Updated Rx to 900mg  TID.

## 2018-12-15 MED ORDER — GABAPENTIN 300 MG PO CAPS: 900.0000 mg | ORAL_CAPSULE | Freq: Three times a day (TID) | ORAL | 5 refills | Status: DC

## 2018-12-15 NOTE — Telephone Encounter (Addendum)
Fax form from pt's pharmacy-need for clarification   Notes to prescriber: Rx for gabapentin has 2 sets of directions. Which is correct?   Please send in new rx

## 2018-12-15 NOTE — Addendum Note (Signed)
Addended by: Lucious Groves on: 12/15/2018 04:16 PM   Modules accepted: Orders

## 2018-12-15 NOTE — Telephone Encounter (Signed)
I have corrected the sig and resent

## 2018-12-24 ENCOUNTER — Other Ambulatory Visit: Payer: Self-pay | Admitting: Internal Medicine

## 2018-12-24 DIAGNOSIS — Z79891 Long term (current) use of opiate analgesic: Secondary | ICD-10-CM

## 2018-12-24 MED ORDER — HYDROCODONE-ACETAMINOPHEN 10-325 MG PO TABS: 1.0000 | ORAL_TABLET | Freq: Four times a day (QID) | ORAL | 0 refills | Status: DC | PRN

## 2018-12-24 NOTE — Telephone Encounter (Signed)
Needs refill on HYDROcodone-acetaminophen (NORCO) 10-325 MG tablet  ;pt contact [place], [place]

## 2019-01-03 ENCOUNTER — Encounter: Payer: Self-pay | Admitting: Pharmacist

## 2019-01-03 NOTE — Progress Notes (Signed)
Medication Samples have been provided to the patient.  Drug: Trulicity Strength: 1.5 mg/0.5 mL Qty: 2 boxes LOT: [id number] D Exp.Date: July 2021 Dosing instructions: Inject 1.5 mg weekly  The patient has been instructed regarding the correct time, dose, and frequency of taking this medication, including desired effects and most common side effects. Patient advised to contact clinic if any questions arise and verbalized understanding by repeat back.  [name] 6:35 PM 01/03/2019

## 2019-01-13 ENCOUNTER — Ambulatory Visit (INDEPENDENT_AMBULATORY_CARE_PROVIDER_SITE_OTHER): Payer: Medicare Other | Admitting: Internal Medicine

## 2019-01-13 ENCOUNTER — Encounter: Payer: Self-pay | Admitting: Internal Medicine

## 2019-01-13 ENCOUNTER — Other Ambulatory Visit: Payer: Self-pay

## 2019-01-13 ENCOUNTER — Telehealth: Payer: Self-pay

## 2019-01-13 VITALS — BP 155/88 | HR 79 | Temp 98.7°F | Ht 67.0 in | Wt 325.2 lb

## 2019-01-13 DIAGNOSIS — G8929 Other chronic pain: Secondary | ICD-10-CM

## 2019-01-13 DIAGNOSIS — I10 Essential (primary) hypertension: Secondary | ICD-10-CM

## 2019-01-13 DIAGNOSIS — Z86711 Personal history of pulmonary embolism: Secondary | ICD-10-CM

## 2019-01-13 DIAGNOSIS — M1711 Unilateral primary osteoarthritis, right knee: Secondary | ICD-10-CM

## 2019-01-13 DIAGNOSIS — Z86718 Personal history of other venous thrombosis and embolism: Secondary | ICD-10-CM

## 2019-01-13 DIAGNOSIS — M5432 Sciatica, left side: Secondary | ICD-10-CM

## 2019-01-13 DIAGNOSIS — I509 Heart failure, unspecified: Secondary | ICD-10-CM

## 2019-01-13 DIAGNOSIS — Z7901 Long term (current) use of anticoagulants: Secondary | ICD-10-CM

## 2019-01-13 DIAGNOSIS — Z6841 Body Mass Index (BMI) 40.0 and over, adult: Secondary | ICD-10-CM

## 2019-01-13 DIAGNOSIS — Z79891 Long term (current) use of opiate analgesic: Secondary | ICD-10-CM

## 2019-01-13 DIAGNOSIS — Z79899 Other long term (current) drug therapy: Secondary | ICD-10-CM

## 2019-01-13 MED ORDER — HYDROCODONE-ACETAMINOPHEN 10-325 MG PO TABS: 1.0000 | ORAL_TABLET | Freq: Four times a day (QID) | ORAL | 0 refills | Status: DC | PRN

## 2019-01-13 MED ORDER — HYDROCODONE-ACETAMINOPHEN 10-325 MG PO TABS: 1.0000 | ORAL_TABLET | Freq: Four times a day (QID) | ORAL | 0 refills | Status: AC | PRN

## 2019-01-13 MED ORDER — GABAPENTIN 300 MG PO CAPS: 900.0000 mg | ORAL_CAPSULE | Freq: Three times a day (TID) | ORAL | 5 refills | Status: DC

## 2019-01-13 MED ORDER — DICLOFENAC SODIUM 1 % TD GEL: 2.0000 g | Freq: Four times a day (QID) | TRANSDERMAL | 5 refills | Status: DC | PRN

## 2019-01-13 MED ORDER — FUROSEMIDE 40 MG PO TABS: 40.0000 mg | ORAL_TABLET | Freq: Every day | ORAL | 3 refills | Status: DC

## 2019-01-13 MED ORDER — PANTOPRAZOLE SODIUM 40 MG PO TBEC: DELAYED_RELEASE_TABLET | ORAL | 3 refills | Status: DC

## 2019-01-13 NOTE — Assessment & Plan Note (Signed)
HPI: No complaints, taking medications as directed  Assessment essential hypertension above goal  Plan Continue lisinopril 40 mg daily. Continue carvedilol 6.25 mg twice daily Continue Lasix 40 mg daily

## 2019-01-13 NOTE — Progress Notes (Addendum)
Subjective:  HPI: [name] is a 58 y.o. female who presents for f/u knee pain  Please see Assessment and Plan below for the status of her chronic medical problems.  Review of Systems: Review of Systems  Constitutional: Negative for malaise/fatigue and weight loss.  Cardiovascular: Negative for palpitations and leg swelling.  Genitourinary: Negative for dysuria.  Musculoskeletal: Positive for back pain and joint pain. Negative for falls.  Neurological: Negative for dizziness.  Psychiatric/Behavioral: Negative for depression.    Objective:  Physical Exam: Vitals:   01/13/19 0833  BP: (!) 155/88  Pulse: 79  Temp: 98.7 F (37.1 C)  TempSrc: Oral  SpO2: 97%  Weight: (!) 325 lb 3.2 oz (147.5 kg)  Height: 5\' 7"  (1.702 m)   Body mass index is 50.93 kg/m. Physical Exam Constitutional:      Appearance: She is obese.  Cardiovascular:     Rate and Rhythm: Normal rate and regular rhythm.  Pulmonary:     Effort: Pulmonary effort is normal.     Breath sounds: Normal breath sounds.  Musculoskeletal:     Right knee: She exhibits no effusion, no LCL laxity, normal patellar mobility, normal meniscus and no MCL laxity. Tenderness found. Medial joint line tenderness noted. No lateral joint line tenderness noted.     Left knee: She exhibits no effusion. No medial joint line and no lateral joint line tenderness noted.     Right lower leg: No edema.     Left lower leg: No edema.  Skin:    General: Skin is warm and dry.    Assessment & Plan:  Essential hypertension, benign HPI: No complaints, taking medications as directed  Assessment essential hypertension above goal  Plan Continue lisinopril 40 mg daily. Continue carvedilol 6.25 mg twice daily Continue Lasix 40 mg daily  Primary osteoarthritis of right knee HPI: Breanna Wilkins reports today that she has increased pain in her right knee.  It is making it difficult to ambulate.  She also trust this new class and feels like it  is going to give out.  She has seen orthopedics before she needs to lose a significant amount of weight before she could get knee replacement surgery.  She has however gotten great relief from intermittent steroid injections with relief of at least 3 months.  She requests another steroid injection today.  She is on Xarelto for her history of DVT and PEs.  We have done knee injections with this in the past without issue.  Assessment primary osteoarthritis of the right knee  Plan Continue to work on weight loss we are trying to get a more stable supply of Ozempic she has been using samples and trying to adjust her diet. I performed a right knee steroid injection on her today.  Chronic pain HPI: She continues to take hydrocodone acetaminophen 10-3 25 number 100 pills/month for chronic pain her pain is predominantly in her right knee low back and left shoulder.  She has not had any red flag behavior with this pain medication.  She denies any constipation.  Pain has helped her to remain mobile she continues to try to implement a walking program however her knee is currently bothering her.  Assessment chronic pain, long-term use of opioid analgesic  Plan Reviewed controlled substance database appropriate We will refill 24-month supply of Norco 10-3 25 number 100 pills/month.  She will follow-up with me in 3 months.  Morbid obesity with BMI of 50.0-59.9, adult ([place]) Continue to readdress the importance of weight  loss for her chronic pain as well as her overall health.  Currently having some difficulty getting weight loss medications approved by her insurance.  We discussed calorie counting and increasing exercise walking.  We are going to get her a rolling walker to help with balance especially with her knee pain.  PROCEDURE NOTE  PROCEDURE: right knee joint steroid injection.  PREOPERATIVE DIAGNOSIS: Osteoarthritis of the right knee.  POSTOPERATIVE DIAGNOSIS: Osteoarthritis of the right knee.   PROCEDURE: The patient was apprised of the risks and the benefits of the procedure.  Time-out procedure was performed, with confirmation of the patient's name, date of birth, and correct identification of the right knee to be injected. The patient's knee was then marked at the appropriate site for injection placement. The knee was sterilely prepped with Betadine. A 40 mg (1 milliliter) solution of Kenalog was drawn up into a 5 mL syringe with a 2 mL of 1% lidocaine. The patient was injected with a 25-gauge needle at the anterior medial aspect of her right flexed knee. There were no complications. The patient tolerated the procedure well. There was minimal bleeding. The patient was instructed to ice her knee upon leaving clinic and refrain from overuse over the next 3 days. The patient was instructed to go to the emergency room with any usual pain, swelling, or redness occurred in the injected area. The patient was given a followup appointment to evaluate response to the injection to his increased range of motion and reduction of pain.   Medications Ordered Meds ordered this encounter  Medications  . pantoprazole (PROTONIX) 40 MG tablet    Sig: TAKE 1 TABLET BY MOUTH 30 MINUTES BEFORE LUNCH OR DINNER    Dispense:  90 tablet    Refill:  3  . gabapentin (NEURONTIN) 300 MG capsule    Sig: Take 3 capsules (900 mg total) by mouth 3 (three) times daily.    Dispense:  270 capsule    Refill:  5  . furosemide (LASIX) 40 MG tablet    Sig: Take 1 tablet (40 mg total) by mouth daily.    Dispense:  90 tablet    Refill:  3  . diclofenac sodium (VOLTAREN) 1 % GEL    Sig: Apply 2-4 g topically 4 (four) times daily as needed (pain). Apply 4 g to knees, 2 grams to shoulders as needed.    Dispense:  200 g    Refill:  5  . HYDROcodone-acetaminophen (NORCO) 10-325 MG tablet    Sig: Take 1 tablet by mouth every 6 (six) hours as needed for up to 30 days for severe pain.    Dispense:  100 tablet    Refill:  0     Refill 30 days after last Rx.  Marland Kitchen HYDROcodone-acetaminophen (NORCO) 10-325 MG tablet    Sig: Take 1 tablet by mouth every 6 (six) hours as needed for up to 30 days for severe pain.    Dispense:  100 tablet    Refill:  0    Rx 2/3  . HYDROcodone-acetaminophen (NORCO) 10-325 MG tablet    Sig: Take 1 tablet by mouth every 6 (six) hours as needed for up to 30 days for severe pain.    Dispense:  100 tablet    Refill:  0    Rx 3/3   Other Orders Orders Placed This Encounter  Procedures  . For home use only DME 4 wheeled rolling walker with seat ([id number])    Length of need 99 months  Bariatric model needed    Order Specific Question:   Patient needs a walker to treat with the following condition    Answer:   Osteoarthritis of right knee [342621]   Follow Up: Return in about 3 months (around 04/15/2019).

## 2019-01-13 NOTE — Assessment & Plan Note (Signed)
HPI: She continues to take hydrocodone acetaminophen 10-3 25 number 100 pills/month for chronic pain her pain is predominantly in her right knee low back and left shoulder.  She has not had any red flag behavior with this pain medication.  She denies any constipation.  Pain has helped her to remain mobile she continues to try to implement a walking program however her knee is currently bothering her.  Assessment chronic pain, long-term use of opioid analgesic  Plan Reviewed controlled substance database appropriate We will refill 61-month supply of Norco 10-3 25 number 100 pills/month.  She will follow-up with me in 3 months.

## 2019-01-13 NOTE — Addendum Note (Signed)
Addended by: Lucious Groves on: 01/13/2019 01:39 PM   Modules accepted: Orders

## 2019-01-13 NOTE — Assessment & Plan Note (Signed)
HPI: Breanna Wilkins reports today that she has increased pain in her right knee.  It is making it difficult to ambulate.  She also trust this new class and feels like it is going to give out.  She has seen orthopedics before she needs to lose a significant amount of weight before she could get knee replacement surgery.  She has however gotten great relief from intermittent steroid injections with relief of at least 3 months.  She requests another steroid injection today.  She is on Xarelto for her history of DVT and PEs.  We have done knee injections with this in the past without issue.  Assessment primary osteoarthritis of the right knee  Plan Continue to work on weight loss we are trying to get a more stable supply of Ozempic she has been using samples and trying to adjust her diet. I performed a right knee steroid injection on her today.

## 2019-01-13 NOTE — Telephone Encounter (Signed)
Information sent to [employer] for walker with seat. Patient was seen today [place], [place] C6/25/20203:07 PM

## 2019-01-13 NOTE — Assessment & Plan Note (Signed)
Continue to readdress the importance of weight loss for her chronic pain as well as her overall health.  Currently having some difficulty getting weight loss medications approved by her insurance.  We discussed calorie counting and increasing exercise walking.  We are going to get her a rolling walker to help with balance especially with her knee pain.

## 2019-01-13 NOTE — Addendum Note (Signed)
Addended by: Lucious Groves on: 01/13/2019 04:42 PM   Modules accepted: Orders

## 2019-01-17 DIAGNOSIS — G5712 Meralgia paresthetica, left lower limb: Secondary | ICD-10-CM | POA: Diagnosis not present

## 2019-01-17 DIAGNOSIS — G8929 Other chronic pain: Secondary | ICD-10-CM | POA: Diagnosis not present

## 2019-01-17 DIAGNOSIS — R5382 Chronic fatigue, unspecified: Secondary | ICD-10-CM | POA: Diagnosis not present

## 2019-01-17 DIAGNOSIS — M5432 Sciatica, left side: Secondary | ICD-10-CM | POA: Diagnosis not present

## 2019-01-17 DIAGNOSIS — G4733 Obstructive sleep apnea (adult) (pediatric): Secondary | ICD-10-CM | POA: Diagnosis not present

## 2019-01-31 ENCOUNTER — Telehealth: Payer: Self-pay | Admitting: Internal Medicine

## 2019-01-31 NOTE — Telephone Encounter (Signed)
pls contact pt [phone]; pt unable to afford medicine

## 2019-02-02 NOTE — Telephone Encounter (Signed)
Hi Lauren, we can follow up with patient. Thank you.

## 2019-02-07 ENCOUNTER — Other Ambulatory Visit: Payer: Self-pay | Admitting: Pharmacist

## 2019-02-07 ENCOUNTER — Ambulatory Visit: Payer: Medicare Other | Admitting: Pharmacist

## 2019-02-07 DIAGNOSIS — Z6841 Body Mass Index (BMI) 40.0 and over, adult: Secondary | ICD-10-CM

## 2019-02-07 DIAGNOSIS — Z7901 Long term (current) use of anticoagulants: Secondary | ICD-10-CM

## 2019-02-07 NOTE — Progress Notes (Signed)
Note documented in separate encounter

## 2019-02-07 NOTE — Progress Notes (Signed)
Medication Samples have been provided to the patient. Patient was also referred to [facility] and applied for Medicare Extra Help Program.  Drug: Xarelto (rivaroxaban) Strength: 20 mg Qty: 4 bottles LOT: [id number] Exp.Date: 07/2020 Dosing instructions: Take 1 tablet by mouth daily with a meal Drug: Ozempic (semaglutide) Strength: 2 mg/1.109mL Qty: 1 LOT: [id number] Exp.Date: 04/2021 Dosing instructions: Inject 0.25 mg once weekly  The patient has been instructed regarding the correct time, dose, and frequency of taking this medication, including desired effects and most common side effects. Patient advised to contact clinic if any questions arise and verbalized understanding by repeat back.  Breanna Wilkins 2:52 PM 02/07/2019

## 2019-02-08 ENCOUNTER — Other Ambulatory Visit: Payer: Self-pay | Admitting: *Deleted

## 2019-02-08 NOTE — Patient Outreach (Signed)
[place] [facility]) Care Management  02/08/2019  MARGENE CHERIAN 04-Jan-1961 [id number]   Telephone Screen  Referral Date: 02/07/19 Referral Source: MD referral  Referral Reason: Patient assistance program for Ozempic and [place]: united health care medicare     Outreach attempt # 1 successful outreach Patient is able to verify HIPAA Reviewed and addressed referral to [facility] with patient  Social: Ms Stopher is single and lives alone She has assist of her mother, sister and family She is independent in her care needs and drives to her medical appointments   Conditions: HTN, CHF class II, MI, internal hemorrhoids, severe OSA, GERD, BRBPR, diverticulosis of colon, carpal tunnel syndrome, sciatica of left side, left meralgia paraesthetica, right foot plantar fasciitis, hx of DVT, hx Pulmonary embolism, hx of colonic polyps, chronic pain, pre diabetes HgA1c = 6.1, pain in left calf, long term use of opiate analgesic, morbid obesity HLD nipple dermatitis  DME: cbg monitor   Medications: Pt had to ask [facility] RN CM the purpose of ozempic  She reports she has been informed she has to meet her deductible She reports xarelto was $300 for a 90 day supply and she states she can not afford this cost She reports asking her MD for generics and at this time she has not received generics. She reports she was on coumadin for 4-5 years but feels that xarelto "works better" she reports having only 5 or 6 more pills left in her xarelto botttle She denies assistance from [place] or an patient assistance program for medications  She states she does not have medication concerns for any of her other medications She reports her MD did provide an oxzempic sample does not prefer to get the flu shot    Appointments:  03/24/19 1045 Dr [name] [place]-  Last seen on 01/13/19   Advance Directives: She states she has advance directive forms but has not completed them and is interested in getting assist with  them    Consent: [facility] RN CM reviewed [facility] services with patient. Patient gave verbal consent for services [facility] telephonic RN CM, [facility] SW.   Plan: [facility] RN CM will refer Ms Mccomas to [place] for assistance with Xarelto and Ozempic [facility] RN CM will refer Ms Brat[place] to [facility] SW for assistance with advance directive [facility] RN CM will refer Ms [place] to [facility] health coach as she voiced she wanted to continue to manage her medical conditions and work on losing weight  Routed note to MD   Deuel. [name], RN, BSN, [place] Coordinator Office number [phone] Mobile number [phone]  Main [facility] number [phone] Fax number [phone]

## 2019-02-09 ENCOUNTER — Other Ambulatory Visit: Payer: Self-pay | Admitting: Pharmacist

## 2019-02-09 ENCOUNTER — Other Ambulatory Visit: Payer: Self-pay | Admitting: Pharmacy Technician

## 2019-02-09 NOTE — Patient Outreach (Signed)
[place] [facility]) Care Management  02/09/2019  Breanna Wilkins 1961/05/25 [id number]                          Medication Assistance Referral  Referral From: [name] / [name] Patient application portion:  Education officer, museum portion: Faxed  to Dr. [name]  Medication/Company: [name] Patient application portion:  Mailed Provider application portion: Faxed  to Dr. [name] [place]  Follow up:  Will follow up with patient in 7-10 business days to confirm application(s) have been received.  [name] [name] Certified Pharmacy Technician [place] Management Direct [phone]

## 2019-02-09 NOTE — Patient Outreach (Signed)
[place] [facility]) Care Management  [place]   02/09/2019  Breanna Wilkins 04/20/1961 [id number]  Reason for referral: Medication Assistance with Xarelto and Ozempic  Referral source: Dr. [name] [place] Current insurance: [facility]  PMHx includes but not limited to:  HTN, HLD, Hx PE / DVT on chronic anticoagulation, severe OSA, CHF, GERD, plantar fasciitis  Outreach:  Successful telephone call with Breanna Wilkins.  HIPAA identifiers verified.   Subjective: Patient reports she is now in the coverage gap and cannot afford the Xarelto.  She states pharmacy co-pay is > $300.  She reports not being concerned about missing the Ozempic but mainly wants to have help for Xarelto.  She denies having issues with other medication costs.    Objective: The ASCVD Risk score Mikey Bussing DC Jr., et al., 2013) failed to calculate for the following reasons:   The patient has a prior MI or stroke diagnosis  Lab Results  Component Value Date   CREATININE 0.81 02/25/2018   CREATININE 0.82 07/09/2017   CREATININE 0.72 05/01/2016    Lab Results  Component Value Date   HGBA1C 6.1 (H) 09/30/2018    Lipid Panel     Component Value Date/Time   CHOL 155 09/30/2018 0935   TRIG 83 09/30/2018 0935   HDL 43 09/30/2018 0935   CHOLHDL 3.6 09/30/2018 0935   CHOLHDL 3.9 10/17/2013 1016   VLDL 20 10/17/2013 1016   LDLCALC 95 09/30/2018 0935    BP Readings from Last 3 Encounters:  01/13/19 (!) 155/88  11/10/18 126/66  09/30/18 (!) 166/77    No Known Allergies  Medications Reviewed Today    Reviewed by Barbaraann Faster, RN (Registered Nurse) on 02/08/19 at 2027  Med List Status: <None>  Medication Order Taking? Sig Documenting Provider Last Dose Status Informant  aspirin 81 MG chewable tablet [id number] No Chew 1 tablet (81 mg total) by mouth daily. Hosie Poisson, MD Taking Active Self  carvedilol (COREG) 6.25 MG tablet [id number]  Take 1 tablet (6.25 mg total) by mouth 2 (two)  times daily with a meal. Isaiah Serge, NP  Active   cyclobenzaprine (FEXMID) 7.5 MG tablet [id number] No Take 1 tablet (7.5 mg total) by mouth 3 (three) times daily as needed for muscle spasms. Lucious Groves, DO Taking Active   diclofenac sodium (VOLTAREN) 1 % GEL [id number]  Apply 2-4 g topically 4 (four) times daily as needed (pain). Apply 4 g to knees, 2 grams to shoulders as needed. Lucious Groves, DO  Active   furosemide (LASIX) 40 MG tablet [id number]  Take 1 tablet (40 mg total) by mouth daily. Lucious Groves, DO  Active   gabapentin (NEURONTIN) 300 MG capsule [id number]  Take 3 capsules (900 mg total) by mouth 3 (three) times daily. Lucious Groves, DO  Active   HYDROcodone-acetaminophen (NORCO) 10-325 MG tablet [id number]  Take 1 tablet by mouth every 6 (six) hours as needed for up to 30 days for severe pain. Lucious Groves, DO  Active   HYDROcodone-acetaminophen (NORCO) 10-325 MG tablet [id number]  Take 1 tablet by mouth every 6 (six) hours as needed for up to 30 days for severe pain. Lucious Groves, DO  Active   HYDROcodone-acetaminophen (NORCO) 10-325 MG tablet [id number]  Take 1 tablet by mouth every 6 (six) hours as needed for up to 30 days for severe pain. Lucious Groves, DO  Active   Insulin Pen Needle (NOVOFINE) 32G X 6 MM MISC [id number]  1 Units by Does not apply route daily. Lucious Groves, DO  Active   lidocaine (XYLOCAINE) 5 % ointment [id number] No Apply 1 application topically 2 (two) times daily as needed. Lucious Groves, DO Taking Active   lisinopril (PRINIVIL,ZESTRIL) 40 MG tablet [id number]  Take 1 tablet (40 mg total) by mouth daily. Lucious Groves, DO  Active   pantoprazole (PROTONIX) 40 MG tablet [id number]  TAKE 1 TABLET BY MOUTH 30 MINUTES BEFORE LUNCH OR DINNER Lucious Groves, DO  Active   polyethylene glycol (MIRALAX / GLYCOLAX) packet [id number] No Take 17 g by mouth daily. [provider] Taking Active   rivaroxaban (XARELTO) 20 MG TABS tablet [id number] No  Take 1 tablet (20 mg total) by mouth daily with supper. Sid Falcon, MD Taking Active   rosuvastatin (CRESTOR) 5 MG tablet [id number]  Take 1 tablet (5 mg total) by mouth at bedtime. Lucious Groves, DO  Active   Semaglutide,0.25 or 0.5MG/DOS, (OZEMPIC, 0.25 OR 0.5 MG/DOSE,) 2 MG/1.5ML SOPN [id number]  Inject 0.25 mg into the skin once a week. Lucious Groves, DO  Active           Assessment: Drugs sorted by system:  Neurologic/Psychologic:gabapentin  Hematologic: aspirin 85m, rivaroxaban  Cardiovascular: carvedilol, furosemide, lisinopril, rosuvastatin  Gastrointestinal:pantoprazole  Endocrine:semaglutide  Topical:lidocaine oint, diclofenac gel  Pain:cyclobenzaprine, hydrocodone-acetaminohpen  Medication Review Findings:  . Aspirin + rivaroxaban: monitor closely for bleeding, no issues currently per patient  Medication Assistance Findings:  Medication assistance needs identified: Xarelto and Ozempic  Extra Help:  Not eligible for Extra Help Low Income Subsidy based on reported income and assets  Patient Assistance Programs: Xarelto made by JDelta Air Linesand JDuncomberequirement met: Yes o Out-of-pocket prescription expenditure met:   No (4% household income)) - Patient has not met application requirements to apply for this program at this time.  - Reviewed program requirements with patient.   - There is an appeals process that we can try to apply for patient.  Patient aware, willing to try to apply.    Ozempic made by [place]requirement met: Yes o Out-of-pocket prescription expenditure met:   Not Applicable - Patient has met application requirements to apply for this program.  - Reviewed program requirements with patient.    Plan: . I will route patient assistance letter to TOcean Bluff-Brant Rocktechnician who will coordinate patient assistance program application process for medications listed above.  [facility] technician will assist with obtaining all  required documents from both patient and provider(s) and submit application(s) once completed.  . Will contact PCP office regarding Xarelto samples.   CRalene Bathe PharmD, [phone]

## 2019-02-10 ENCOUNTER — Other Ambulatory Visit: Payer: Self-pay | Admitting: *Deleted

## 2019-02-10 ENCOUNTER — Encounter: Payer: Self-pay | Admitting: *Deleted

## 2019-02-10 NOTE — Patient Outreach (Signed)
[facility]) Care Management  02/10/2019  Breanna Wilkins Dec 02, 1960 [id number]   CSW was able to make initial contact with patient today to perform phone assessment, as well as assess and assist with social work needs and services.  CSW introduced self, explained role and types of services provided through [place] Management ([place] Management).  CSW further explained to patient that CSW works with patient's RNCM, also with [place] Management, Breanna Wilkins. CSW then explained the reason for the call, indicating that Mrs. Breanna Wilkins thought that patient would benefit from social work services and resources to assist with completion of Advanced Directives.  CSW obtained two HIPAA compliant identifiers from patient, which included patient's name and date of birth.  Patient admitted that she would really like to go ahead and complete her Advanced Directives (both Living Will and [place] documents), requesting that CSW mail her an Garment/textile technologist, as well as assist with completion.  CSW voiced understanding, agreeing to mail patient an Advanced Directives packet today, along with EMMI information regarding Advanced Directives completion.  Patient denies needing assistance with obtaining transportation, food, housing, medications, etc., reporting that she is currently able to obtain all of these services and resources for herself.  Patient further indicated that she is able to perform all activities of daily living independently and has a great support system.  CSW agreed to follow-up with patient on Tuesday, February 15, 2019, around 11:00AM, to ensure that she received the packet of resource information mailed to her home by CSW today.  The packet of information will include all of the following:   [place] Consent for Treatment Advanced Directives packet EMMI information pertaining specifically to Advanced Directives  completion  Nat Christen, BSW, MSW, LCSW  Licensed Clinical Social Worker  [place]  Mailing [place] [address] [place], [place] [address] Physical Address-[address], [place], [place] [address] Toll Free Main # [phone] Fax # [phone] Cell # [phone]  Office # [phone] [email or web address]

## 2019-02-11 ENCOUNTER — Other Ambulatory Visit: Payer: Self-pay | Admitting: Pharmacist

## 2019-02-11 NOTE — Patient Outreach (Signed)
[place] [facility]) Care Management  [place] 02/11/2019  Breanna Wilkins [birth year] [id number]  Reason for call: update on Xarelto  Per pharmacy technician, for appeals process for Xarelto, patient will need to submit a letter of hardship stating she has not met out-of-pocket expenditure with application.    Unsuccessful call to patient today to update.  HIPAA compliant voicemail left.   Plan: Will outreach patient again next week  Ralene Bathe, PharmD, [phone]

## 2019-02-15 ENCOUNTER — Other Ambulatory Visit: Payer: Self-pay | Admitting: Pharmacist

## 2019-02-15 ENCOUNTER — Ambulatory Visit: Payer: Self-pay | Admitting: Pharmacist

## 2019-02-15 ENCOUNTER — Other Ambulatory Visit: Payer: Self-pay | Admitting: *Deleted

## 2019-02-15 NOTE — Patient Outreach (Signed)
[facility] [facility]) Care Management  [place] 02/15/2019  Breanna Wilkins 02-02-1961 [id number]  Reason for call: update on Xarelto  Successful call to Breanna Wilkins today.  Updated patient on need for letter of financial hardship for Xarelto application.  Patient voiced understanding.  Applications have not yet arrived but should be at her home this week.  She will complete applications and include the letter of financial hardship when she mails the documents back to [facility].   Ralene Bathe, PharmD, [place] [phone]

## 2019-02-15 NOTE — Patient Outreach (Signed)
[place] [facility]) Care Management  02/15/2019  TIYONA DESOUZA [birth year] [id number]   CSW made an attempt to try and contact patient today to ensure that patient received the Advanced Directives packet (Living Will and [place] documents) mailed to her home by CSW, as well as assist with completion; however, patient was unavailable at the time of CSW's call.  CSW left a HIPAA compliant message on voicemail for patient and is currently awaiting a return call.  CSW will make a second outreach attempt within the next 3-4 business days, if a return call is not received from patient in the meantime.  Nat Christen, BSW, MSW, LCSW  Licensed Education officer, environmental Health System  Mailing [place] [address] [place], [place] [address] Physical Address-[address], [place], [place] [address] Toll Free Main # [phone] Fax # [phone] Cell # [phone]  Office # [phone] [email or web address]

## 2019-02-21 ENCOUNTER — Encounter: Payer: Self-pay | Admitting: *Deleted

## 2019-02-21 ENCOUNTER — Other Ambulatory Visit: Payer: Self-pay | Admitting: *Deleted

## 2019-02-21 NOTE — Patient Outreach (Signed)
[name] Care Management  02/21/2019  Breanna Wilkins 1960/12/03 [id number]   CSW was able to make contact with patient today to ensure that she received the Advanced Directives (Living Will and [place] documents) mailed to her by CSW, as well as offer to assist with completion.  Patient admitted to receiving the documents, requesting that her best friend, Arnette Norris be able to assist her with completion.  Patient denied having any specific questions, other than where she is able to take the documents to have them notarized, once completed.  CSW provided patient with a list of [place] affiliated facilities, as well as local banks that offer notary services free of charge.  Patient was encouraged to keep the original copy, provide her appointed healthcare agent(s) with a copy and provide her Primary Care Physician, Dr. Joni Reining a copy, during her next scheduled appointment.  Patient voiced understanding and was agreeable to this plan.  CSW will perform a case closure on patient, as all goals of treatment have been met from social work standpoint and no additional social work needs have been identified at this time.  CSW will notify patient's [place], patient's Pharmacist, Ralene Bathe and patient's Pharmacy Technician, Etter Sjogren, all with [facility], of CSW's plans to close patient's case.  CSW will fax an update to patient's Primary Care Physician, Dr. Joni Reining to ensure that they are aware of CSW's involvement with patient's plan of care.  CSW was able to confirm that patient has the correct contact information for CSW, encouraging patient to contact CSW directly if she has questions or needs additional social work services in the near future.    Nat Christen, BSW, MSW, LCSW  Licensed Education officer, environmental Health System  Mailing  [place] [address], [place], [place] [address] Physical Address-[address], [place], [place] [address] Toll Free Main # [phone] Fax # [phone] Cell # [phone]  Office # [phone] [email or web address]

## 2019-02-22 ENCOUNTER — Other Ambulatory Visit: Payer: Self-pay | Admitting: *Deleted

## 2019-02-22 ENCOUNTER — Other Ambulatory Visit: Payer: Self-pay | Admitting: Pharmacy Technician

## 2019-02-22 NOTE — Patient Outreach (Signed)
[place] [facility]) Care Management  02/22/2019  Breanna Wilkins 07-16-1961 [id number]    Successful call placed to patient regarding patient assistance application(s) for Xarelto and Ozempic , HIPAA identifiers verified. [name] confirms that she received [place] and [employer] patient assistance applications. She states that someone is helping her fill applications out and that she will mail back in tomorrow.  Follow up:  Will follow up with patient in 2-3 weeks if documents have not been received  [name]. Chana Bode [place] Certified Pharmacy Technician [place] [facility] Direct Dial:[phone]

## 2019-02-22 NOTE — Patient Outreach (Addendum)
[place] [facility] [facility]) Care Management  02/22/2019   TELIYAH Wilkins 1961/02/18 [id number]  RN Health Coach telephone call to patient.  Hipaa compliance verified. Per patient she does not check her blood pressure. Patient does not have a blood pressure monitor. Patient stated she is taking medications as per ordered. Per patient she is not on any special diet. Per patient she knows that she she should be on one. Patient has pain in knees and back. Patient uses a cane to ambulate. Patient is having some intermittent swelling in legs and feet. Patient has agreed to follow up outreach calls.  Objective: Current Medications:  Current Outpatient Medications  Medication Sig Dispense Refill  . aspirin 81 MG chewable tablet Chew 1 tablet (81 mg total) by mouth daily. 30 tablet 1  . carvedilol (COREG) 6.25 MG tablet Take 1 tablet (6.25 mg total) by mouth 2 (two) times daily with a meal. 180 tablet 3  . cyclobenzaprine (FEXMID) 7.5 MG tablet Take 1 tablet (7.5 mg total) by mouth 3 (three) times daily as needed for muscle spasms. 15 tablet 0  . diclofenac sodium (VOLTAREN) 1 % GEL Apply 2-4 g topically 4 (four) times daily as needed (pain). Apply 4 g to knees, 2 grams to shoulders as needed. 200 g 5  . furosemide (LASIX) 40 MG tablet Take 1 tablet (40 mg total) by mouth daily. 90 tablet 3  . gabapentin (NEURONTIN) 300 MG capsule Take 3 capsules (900 mg total) by mouth 3 (three) times daily. 270 capsule 5  . HYDROcodone-acetaminophen (NORCO) 10-325 MG tablet Take 1 tablet by mouth every 6 (six) hours as needed for up to 30 days for severe pain. 100 tablet 0  . [START ON 02/23/2019] HYDROcodone-acetaminophen (NORCO) 10-325 MG tablet Take 1 tablet by mouth every 6 (six) hours as needed for up to 30 days for severe pain. 100 tablet 0  . [START ON 03/25/2019] HYDROcodone-acetaminophen (NORCO) 10-325 MG tablet Take 1 tablet by mouth every 6 (six) hours as needed for up to 30 days for severe pain. 100 tablet  0  . Insulin Pen Needle (NOVOFINE) 32G X 6 MM MISC 1 Units by Does not apply route daily. 100 each 1  . lidocaine (XYLOCAINE) 5 % ointment Apply 1 application topically 2 (two) times daily as needed. 150 g 1  . lisinopril (PRINIVIL,ZESTRIL) 40 MG tablet Take 1 tablet (40 mg total) by mouth daily. 90 tablet 3  . pantoprazole (PROTONIX) 40 MG tablet TAKE 1 TABLET BY MOUTH 30 MINUTES BEFORE LUNCH OR DINNER 90 tablet 3  . polyethylene glycol (MIRALAX / GLYCOLAX) packet Take 17 g by mouth daily.    . rivaroxaban (XARELTO) 20 MG TABS tablet Take 1 tablet (20 mg total) by mouth daily with supper. 90 tablet 3  . rosuvastatin (CRESTOR) 5 MG tablet Take 1 tablet (5 mg total) by mouth at bedtime. 90 tablet 3  . Semaglutide,0.25 or 0.5MG /DOS, (OZEMPIC, 0.25 OR 0.5 MG/DOSE,) 2 MG/1.5ML SOPN Inject 0.25 mg into the skin once a week. 1 pen 3   No current facility-administered medications for this visit.     Functional Status:  In your present state of health, do you have any difficulty performing the following activities: 02/22/2019 02/10/2019  Hearing? N N  Vision? N N  Comment - -  Difficulty concentrating or making decisions? N N  Walking or climbing stairs? N N  Comment - -  Dressing or bathing? N N  Doing errands, shopping? N [employer]  and eating ? N N  Using the Toilet? N N  In the past six months, have you accidently leaked urine? N N  Do you have problems with loss of bowel control? N N  Managing your Medications? N N  Managing your Finances? - N  Housekeeping or managing your Housekeeping? - N  Some recent data might be hidden    Fall/Depression Screening: Fall Risk  02/22/2019 02/10/2019 01/13/2019  Falls in the past year? 0 0 0  Number falls in past yr: - 0 -  Injury with Fall? - 0 -  Risk for fall due to : - - -  Risk for fall due to: Comment - - -   PHQ 2/9 Scores 02/22/2019 02/10/2019 02/08/2019 09/30/2018 07/08/2018 05/03/2018 02/25/2018  PHQ - 2 Score 0 0 0 0 0 0 0  PHQ- 9 Score  - - - 1 0 1 -   THN CM Care Plan Problem One     Most Recent Value  Care Plan Problem One  Knowledge Deficit in Self Management of Hypertension  Role Documenting the Problem One  [place] for Problem One  Active  THN Long Term Goal   Patient will be able to do self monitoring at home within the next 90 days.RN sent a matter of choice blood pressure control. RN will follow up with further discussion  THN Long Term Goal Start Date  02/22/19  Interventions for Problem One Long Term Goal  Patient will purchase a monitor and start checking self blood pressures. RN sent educational material on how to read blood pressure monitor.  THN CM Short Term Goal #1   Patient will verbalize receiving a chart on high and low sodium foods within the 30 days  THN CM Short Term Goal #1 Start Date  02/22/19  Interventions for Short Term Goal #1  RN discussed foods and high and low in sodium. RN sent a picture chart. RN will follow up with further discussion  THN CM Short Term Goal #2   Patient will verbalize understanding labels on  [facility] CM Short Term Goal #2 Start Date  02/22/19  Interventions for Short Term Goal #2  RN discussed with patient how to read labels.RN sent a sheet on reading food labels. RN will follow up with further discussion.      Assessment:  Patient is not monitoring blood pressure Patient is not on a special diet. Patient will benefit from Health Coach telephonic outreach for education and support for hypertension self management..  Plan:  RN sent A Matter of Choice Blood pressure control booklet RN discussed low sodium diet RN discussed reading labels on food RN sent a picture sheet on foods high and low in sodium RN sent a sheet on reading labels RN discussed monitoring blood pressure RN sent a sheet on How to read blood pressure RN sent barriers and assessment letter to physician RN will follow up with in the month of October RN sent [facility] catalog Patient will order a  monitor  [facility] [phone]

## 2019-03-04 ENCOUNTER — Other Ambulatory Visit: Payer: Self-pay | Admitting: Pharmacy Technician

## 2019-03-04 NOTE — Patient Outreach (Addendum)
[place] [facility]) Care Management  03/04/2019  Breanna Wilkins [birth year] [id number]   Received patient portion(s) of patient assistance application for Xarelto and Ozempic.   Breanna Wilkins included letter of hardship, however did not include her household Proof of Income.  Unsuccessful call #1 placed to patient regarding patient assistance missing document, HIPAA compliant voicemail left.   Follow up:  Will make 2nd call attempt in 2-3 business days if call has not been returned.  [name] Chana Bode [place] Certified Pharmacy Technician [place] [facility] Direct [phone]

## 2019-03-09 ENCOUNTER — Other Ambulatory Visit: Payer: Self-pay | Admitting: Pharmacy Technician

## 2019-03-09 NOTE — Patient Outreach (Signed)
[facility]) Care Management  03/09/2019  Breanna Wilkins 06-17-1961 [id number]    Unsuccessful call #2 placed to patient regarding patient assistance application(s) for Xarelto and Ozempic , HIPAA compliant voicemail left. 2nd call placed to patient to inform about missing proof of income documents that are required for patient assistance applications.  Follow up:  Will follow make 3rd call attempt in 3-5 business days if call has not been returned.  [name] Chana Bode [employer] Certified Pharmacy Technician [place] [facility] Direct Dial:[phone]

## 2019-03-17 ENCOUNTER — Ambulatory Visit: Payer: Self-pay | Admitting: Pharmacist

## 2019-03-17 ENCOUNTER — Other Ambulatory Visit: Payer: Self-pay | Admitting: Pharmacist

## 2019-03-17 NOTE — Patient Outreach (Signed)
[place]) Care Management  [place] 03/17/2019  PAW CARBINE [birth year] [id number]  Reason for call: missing proof of income for patient assistance program applications  Successful call to Ms. [place].  Updated patient that [facility] received completed applications but did not have proof of income.  Unable to submit applications for Xarelto and Ozempic until this is received.  Patient requests that [facility] send her another return envelope and she will look for necessary documents.    Plan: [facility] pharmacy technician will mail patient return envelope and will f/u again with patient per workflow  Ralene Bathe, PharmD, [place] [phone]    s

## 2019-03-24 ENCOUNTER — Encounter: Payer: Self-pay | Admitting: Internal Medicine

## 2019-03-24 ENCOUNTER — Other Ambulatory Visit: Payer: Self-pay

## 2019-03-24 ENCOUNTER — Ambulatory Visit (INDEPENDENT_AMBULATORY_CARE_PROVIDER_SITE_OTHER): Payer: Medicare Other | Admitting: Internal Medicine

## 2019-03-24 VITALS — BP 127/48 | HR 72 | Temp 98.0°F | Ht 67.0 in | Wt 322.7 lb

## 2019-03-24 DIAGNOSIS — M1711 Unilateral primary osteoarthritis, right knee: Secondary | ICD-10-CM

## 2019-03-24 DIAGNOSIS — I509 Heart failure, unspecified: Secondary | ICD-10-CM

## 2019-03-24 DIAGNOSIS — I110 Hypertensive heart disease with heart failure: Secondary | ICD-10-CM

## 2019-03-24 DIAGNOSIS — I10 Essential (primary) hypertension: Secondary | ICD-10-CM

## 2019-03-24 DIAGNOSIS — Z79899 Other long term (current) drug therapy: Secondary | ICD-10-CM

## 2019-03-24 DIAGNOSIS — G8929 Other chronic pain: Secondary | ICD-10-CM

## 2019-03-24 DIAGNOSIS — M25512 Pain in left shoulder: Secondary | ICD-10-CM | POA: Diagnosis not present

## 2019-03-24 DIAGNOSIS — Z91120 Patient's intentional underdosing of medication regimen due to financial hardship: Secondary | ICD-10-CM

## 2019-03-24 DIAGNOSIS — Z79891 Long term (current) use of opiate analgesic: Secondary | ICD-10-CM

## 2019-03-24 DIAGNOSIS — G4733 Obstructive sleep apnea (adult) (pediatric): Secondary | ICD-10-CM

## 2019-03-24 DIAGNOSIS — Z23 Encounter for immunization: Secondary | ICD-10-CM

## 2019-03-24 DIAGNOSIS — Z7901 Long term (current) use of anticoagulants: Secondary | ICD-10-CM

## 2019-03-24 DIAGNOSIS — Z6841 Body Mass Index (BMI) 40.0 and over, adult: Secondary | ICD-10-CM

## 2019-03-24 DIAGNOSIS — M545 Low back pain: Secondary | ICD-10-CM | POA: Diagnosis not present

## 2019-03-24 MED ORDER — HYDROCODONE-ACETAMINOPHEN 10-325 MG PO TABS: 1.0000 | ORAL_TABLET | Freq: Four times a day (QID) | ORAL | 0 refills | Status: DC | PRN

## 2019-03-24 MED ORDER — HYDROCODONE-ACETAMINOPHEN 10-325 MG PO TABS: 1.0000 | ORAL_TABLET | Freq: Four times a day (QID) | ORAL | 0 refills | Status: AC | PRN

## 2019-03-24 NOTE — Assessment & Plan Note (Signed)
Check CBC, no active or recent bleeding or melena.

## 2019-03-24 NOTE — Assessment & Plan Note (Signed)
HPI: She reports that her right knee is getting more problematic, notes that pain is worse with walking and activities.  She is requesting another steroid injection she reports these are greatly beneficial.  She has been using more of her hydrocodone recently causing her to run out a little early.  However she feels that 100 pills a month is enough to treat her chronic back, knee and shoulder pain to allow functionality if she is able to have the steroid injection.  Assessment primary osteoarthritis of the right knee  Plan: Right knee steroid injection completed today.

## 2019-03-24 NOTE — Progress Notes (Signed)
Subjective:  HPI: [name] is a 58 y.o. female who presents for f/u HTN, obesity and chronic pain  Please see Assessment and Plan below for the status of her chronic medical problems.  Review of Systems: Review of Systems  Constitutional: Negative for fever.  Respiratory: Negative for shortness of breath.   Cardiovascular: Negative for chest pain.  Musculoskeletal: Positive for back pain and joint pain. Negative for falls.  Neurological: Negative for dizziness.  Endo/Heme/Allergies: Negative for polydipsia.    Objective:  Physical Exam: Vitals:   03/24/19 1026  BP: (!) 127/48  Pulse: 72  Temp: 98 F (36.7 C)  TempSrc: Oral  SpO2: 97%  Weight: (!) 322 lb 11.2 oz (146.4 kg)  Height: 5\' 7"  (1.702 m)   Body mass index is 50.54 kg/m. Physical Exam Vitals signs and nursing note reviewed.  Constitutional:      General: She is not in acute distress.    Appearance: Normal appearance. She is obese.  Cardiovascular:     Rate and Rhythm: Normal rate and regular rhythm.     Pulses: Normal pulses.     Heart sounds: Normal heart sounds.  Pulmonary:     Effort: Pulmonary effort is normal.     Breath sounds: Normal breath sounds.  Musculoskeletal:     Right knee: She exhibits no LCL laxity, normal meniscus and no MCL laxity. Tenderness found. Medial joint line tenderness noted.       Legs:    Assessment & Plan:  See Encounters Tab for problem based charting.  Medications Ordered Meds ordered this encounter  Medications  . HYDROcodone-acetaminophen (NORCO) 10-325 MG tablet    Sig: Take 1 tablet by mouth every 6 (six) hours as needed for severe pain.    Dispense:  100 tablet    Refill:  0    Rx 2/3  . HYDROcodone-acetaminophen (NORCO) 10-325 MG tablet    Sig: Take 1 tablet by mouth every 6 (six) hours as needed for severe pain.    Dispense:  100 tablet    Refill:  0    Rx 3/3  . HYDROcodone-acetaminophen (NORCO) 10-325 MG tablet    Sig: Take 1 tablet by  mouth every 6 (six) hours as needed for severe pain.    Dispense:  100 tablet    Refill:  0    Rx 1/3   Other Orders Orders Placed This Encounter  Procedures  . Flu Vaccine QUAD 36+ mos IM  . BMP8+Anion Gap  . CBC with Diff   Follow Up: Return in about 3 months (around 06/23/2019).  PROCEDURE NOTE  PROCEDURE: right knee joint steroid injection.  PREOPERATIVE DIAGNOSIS: Osteoarthritis of the right knee.  POSTOPERATIVE DIAGNOSIS: Osteoarthritis of the right knee.  PROCEDURE: The patient was apprised of the risks and the benefits of the procedure and informed consent was obtained, as witnessed by [facility]. Time-out procedure was performed, with confirmation of the patient's name, date of birth, and correct identification of the right knee to be injected. The patient's knee was then marked at the appropriate site for injection placement. The knee was sterilely prepped with Betadine. A 40 mg (1 milliliter) solution of Kenalog was drawn up into a 5 mL syringe with a 2 mL of 1% lidocaine. The patient was injected with a 25-gauge needle at the anterior-medial aspect of her right flexed knee. There were no complications. The patient tolerated the procedure well. There was minimal bleeding. The patient was instructed to ice her knee upon leaving clinic  and refrain from overuse over the next 3 days. The patient was instructed to go to the emergency room with any usual pain, swelling, or redness occurred in the injected area. The patient was given a followup appointment to evaluate response to the injection to his increased range of motion and reduction of pain.  Marland Kitchen

## 2019-03-24 NOTE — Assessment & Plan Note (Addendum)
HPI: She has been taking her antihypertensives without any adverse effects.  THN has been involved with additional education about low-salt diet.  She is currently feeling very well has no acute issues in relation to her hypertension.  Assessment essential hypertension well-controlled  Plan Continue lisinopril 40 mg daily Continue carvedilol 6.25 mg twice daily Check BMP and CBC

## 2019-03-24 NOTE — Assessment & Plan Note (Signed)
She is still been struggling to lose weight.  She has been trying to drink diet drinks without sugar but has not been able to lose any significant amount of weight.  We had tried prescribing Ozempic for weight loss however she has been unable to afford this medication due to insurance not covering medication for weight loss.  Her morbid obesity is a contributor to multiple of her chronic ailments and I do think that this is an important issue to continue to address.  I did discuss again diet strategies and discussed with her there was some evidence for intermittent fasting.  We discussed the 16-8 diet and I printed her out some materials about this.

## 2019-03-24 NOTE — Assessment & Plan Note (Signed)
HPI: She continues to take hydrocodone acetaminophen for her chronic pain chronic pain generator is low back pain, left shoulder pain and right knee pain.  Lately her right knee has been bothering her more she is about 3 months out from her last knee injection.  This is caused her to use a little bit more of her pain medication and she has been running out for having to try to conserve her medication to last the whole month.  She usually takes medication 3 times a day but occasionally needs 4 pills on bad days.  Assessment chronic pain with long-term use of opioid analgesic  Plan No red flags I feel that continuation of opioid therapy is reasonable.  We will continue to have ongoing monitoring of her use of this but overall she seems to have appropriate use.  Continue to utilize alternative therapy including injections to reduce need for opioid medications.  And ongoing emphasis on weight loss which I do think will help a lot of her musculoskeletal complaints.

## 2019-03-25 LAB — CBC WITH DIFFERENTIAL/PLATELET
Basophils Absolute: 0.1 10*3/uL (ref 0.0–0.2)
Basos: 1 %
EOS (ABSOLUTE): 0.2 10*3/uL (ref 0.0–0.4)
Eos: 2 %
Hematocrit: 39.2 % (ref 34.0–46.6)
Hemoglobin: 12.6 g/dL (ref 11.1–15.9)
Immature Grans (Abs): 0 10*3/uL (ref 0.0–0.1)
Immature Granulocytes: 0 %
Lymphocytes Absolute: 1.4 10*3/uL (ref 0.7–3.1)
Lymphs: 19 %
MCH: 28.5 pg (ref 26.6–33.0)
MCHC: 32.1 g/dL (ref 31.5–35.7)
MCV: 89 fL (ref 79–97)
Monocytes Absolute: 0.9 10*3/uL (ref 0.1–0.9)
Monocytes: 12 %
Neutrophils Absolute: 5 10*3/uL (ref 1.4–7.0)
Neutrophils: 66 %
Platelets: 289 10*3/uL (ref 150–450)
RBC: 4.42 x10E6/uL (ref 3.77–5.28)
RDW: 14.4 % (ref 11.7–15.4)
WBC: 7.5 10*3/uL (ref 3.4–10.8)

## 2019-03-25 LAB — BMP8+ANION GAP
Anion Gap: 15 mmol/L (ref 10.0–18.0)
BUN/Creatinine Ratio: 20 (ref 9–23)
BUN: 21 mg/dL (ref 6–24)
CO2: 24 mmol/L (ref 20–29)
Calcium: 10.1 mg/dL (ref 8.7–10.2)
Chloride: 103 mmol/L (ref 96–106)
Creatinine, Ser: 1.06 mg/dL — ABNORMAL HIGH (ref 0.57–1.00)
GFR calc Af Amer: 66 mL/min/{1.73_m2} (ref >59)
GFR calc non Af Amer: 58 mL/min/{1.73_m2} — ABNORMAL LOW (ref >59)
Glucose: 93 mg/dL (ref 65–99)
Potassium: 4.4 mmol/L (ref 3.5–5.2)
Sodium: 142 mmol/L (ref 134–144)

## 2019-04-13 ENCOUNTER — Other Ambulatory Visit: Payer: Self-pay | Admitting: Pharmacist

## 2019-04-13 NOTE — Patient Outreach (Signed)
[place] [facility]) Care Management [place]  04/13/2019  Breanna Wilkins 12-23-1960 [id number]   F/u on patient assistance program application for Ozempic and Xarelto.  Patient does not qualify for Xarelto due to not having spent the 4% of annual income in out of pocket expenditures on medications.  She does qualify for Ozempic and returned patient portion of application but omitted proof of income.  Another return envelope was mailed to patient on 8/27 per her request so she could return proof of income but documents have not yet been received at [place] office.   Successful call to patient today.  Patient reports again that she needs assistance with Ozempic and Xarelto.  Reviewed that she does not qualify for Xarelto but can still apply for Ozempic.  She will need to send in proof of income.  Patient voiced understanding and requested another envelope be mailed to her house.  She will try to obtain a bank statement and return to [facility].    Plan: -Will mail patient 3rd return envelope.  -Will close case for now until patient able to return of proof of income.  Once documents received, THN will be happy to assist with submitting to patient assistance program for Ozempic.   Ralene Bathe, PharmD, [place] [phone]

## 2019-04-15 ENCOUNTER — Ambulatory Visit: Payer: Medicare Other | Admitting: Pharmacist

## 2019-04-15 DIAGNOSIS — G4733 Obstructive sleep apnea (adult) (pediatric): Secondary | ICD-10-CM | POA: Diagnosis not present

## 2019-04-15 NOTE — Progress Notes (Signed)
Medication Samples have been provided to the patient.  Drug name: Xarelto       Strength: 20 mg        Qty: 4  LOT: [id number]  Exp.Date: 5/22  Dosing instructions: 1 tablet daily  The patient has been instructed regarding the correct time, dose, and frequency of taking this medication, including desired effects and most common side effects.   [name] 2:27 PM 04/15/2019

## 2019-04-15 NOTE — Progress Notes (Signed)
Medication Samples have been provided to the patient.  Drug name: Xarelto       Strength: 20 mg       Qty: 4 bottles LOT: [id number]  Exp.Date: 11/2020  Dosing instructions: Take one tablet once daily with food.   Samples picked up by patient's sister, DOB verified.   The patient has been instructed regarding the correct time, dose, and frequency of taking this medication, including desired effects and most common side effects.   Brenton Grills 1:08 PM 04/15/2019

## 2019-04-25 ENCOUNTER — Telehealth: Payer: Self-pay | Admitting: *Deleted

## 2019-04-25 ENCOUNTER — Ambulatory Visit: Payer: Medicare Other | Admitting: *Deleted

## 2019-04-25 NOTE — Telephone Encounter (Signed)
[facility] provider for housecalls saw pt this weekend, A1C 6.2

## 2019-04-26 ENCOUNTER — Other Ambulatory Visit: Payer: Self-pay | Admitting: *Deleted

## 2019-04-26 NOTE — Patient Outreach (Signed)
[place] [place] [facility]) Care Management  04/26/2019   Breanna Wilkins 13-Nov-1960 [id number]  [place] Management [phone]  RN Health Coach attempted follow up outreach call to patient.  Patient was unavailable. HIPPA compliance voicemail message left with return callback number.  RN Health Coach received  telephone call from patient.  Hipaa compliance verified. Per patient she has not purchased her blood pressure machine. Patient has not been monitoring her blood pressure. Patient reported receiving the information sent by health coach. RN and patient went over the items and pages to order the scale and blood pressure monitor in the catalog. RN discussed portion control since the patient is trying to loose weight. RN discussed healthy eating. Patient has agreed to follow up outreach calls.   Current Medications:  Current Outpatient Medications  Medication Sig Dispense Refill  . aspirin 81 MG chewable tablet Chew 1 tablet (81 mg total) by mouth daily. 30 tablet 1  . carvedilol (COREG) 6.25 MG tablet Take 1 tablet (6.25 mg total) by mouth 2 (two) times daily with a meal. 180 tablet 3  . cyclobenzaprine (FEXMID) 7.5 MG tablet Take 1 tablet (7.5 mg total) by mouth 3 (three) times daily as needed for muscle spasms. 15 tablet 0  . diclofenac sodium (VOLTAREN) 1 % GEL Apply 2-4 g topically 4 (four) times daily as needed (pain). Apply 4 g to knees, 2 grams to shoulders as needed. 200 g 5  . furosemide (LASIX) 40 MG tablet Take 1 tablet (40 mg total) by mouth daily. 90 tablet 3  . gabapentin (NEURONTIN) 300 MG capsule Take 3 capsules (900 mg total) by mouth 3 (three) times daily. 270 capsule 5  . [START ON 04/27/2019] HYDROcodone-acetaminophen (NORCO) 10-325 MG tablet Take 1 tablet by mouth every 6 (six) hours as needed for severe pain. 100 tablet 0  . [START ON 05/27/2019] HYDROcodone-acetaminophen (NORCO) 10-325 MG tablet Take 1 tablet by mouth every  6 (six) hours as needed for severe pain. 100 tablet 0  . HYDROcodone-acetaminophen (NORCO) 10-325 MG tablet Take 1 tablet by mouth every 6 (six) hours as needed for severe pain. 100 tablet 0  . Insulin Pen Needle (NOVOFINE) 32G X 6 MM MISC 1 Units by Does not apply route daily. 100 each 1  . lidocaine (XYLOCAINE) 5 % ointment Apply 1 application topically 2 (two) times daily as needed. 150 g 1  . lisinopril (PRINIVIL,ZESTRIL) 40 MG tablet Take 1 tablet (40 mg total) by mouth daily. 90 tablet 3  . pantoprazole (PROTONIX) 40 MG tablet TAKE 1 TABLET BY MOUTH 30 MINUTES BEFORE LUNCH OR DINNER 90 tablet 3  . polyethylene glycol (MIRALAX / GLYCOLAX) packet Take 17 g by mouth daily.    . rivaroxaban (XARELTO) 20 MG TABS tablet Take 1 tablet (20 mg total) by mouth daily with supper. 90 tablet 3  . rosuvastatin (CRESTOR) 5 MG tablet Take 1 tablet (5 mg total) by mouth at bedtime. 90 tablet 3  . Semaglutide,0.25 or 0.5MG /DOS, (OZEMPIC, 0.25 OR 0.5 MG/DOSE,) 2 MG/1.5ML SOPN Inject 0.25 mg into the skin once a week. 1 pen 3   No current facility-administered medications for this visit.     Functional Status:  In your present state of health, do you have any difficulty performing the following activities: 03/24/2019 02/22/2019  Hearing? N N  Vision? N N  Difficulty concentrating or making decisions? N N  Walking or climbing stairs? Y N  Comment - -  Dressing or bathing?  N N  Doing errands, shopping? N N  Preparing Food and eating ? - N  Using the Toilet? - N  In the past six months, have you accidently leaked urine? - N  Do you have problems with loss of bowel control? - N  Managing your Medications? - N  Managing your Finances? - -  Housekeeping or managing your Housekeeping? - -  Some recent data might be hidden    Fall/Depression Screening: Fall Risk  04/26/2019 03/24/2019 02/22/2019  Falls in the past year? 0 0 0  Number falls in past yr: 0 - -  Injury with Fall? 0 - -  Risk for fall due to :  Impaired balance/gait;Impaired mobility - -  Risk for fall due to: Comment - - -  Follow up Falls prevention discussed Falls prevention discussed -   PHQ 2/9 Scores 04/26/2019 03/24/2019 02/22/2019 02/10/2019 02/08/2019 09/30/2018 07/08/2018  PHQ - 2 Score 0 0 0 0 0 0 0  PHQ- 9 Score - - - - - 1 0    Assessment:  Patient has not ordered blood pressure supplies Patient reported receiving educational material Patient has received flu shot [id number] Patient will benefit from [place] telephonic outreach for education and support for hypertension self management.  Plan: RN discussed monitoring blood pressure RN discussed weighing  RN discussed ordering from [place] showed patient the page for BP monitor and scale RN sent a 2020 Calendar book  Clinical key education My plate from [place] EMMI education on Serving sizes RN will follow up within the month of December  [place] Care Management [phone]

## 2019-04-28 ENCOUNTER — Other Ambulatory Visit: Payer: Self-pay | Admitting: Internal Medicine

## 2019-04-28 DIAGNOSIS — Z1231 Encounter for screening mammogram for malignant neoplasm of breast: Secondary | ICD-10-CM

## 2019-05-05 ENCOUNTER — Telehealth: Payer: Self-pay | Admitting: Internal Medicine

## 2019-05-05 DIAGNOSIS — G4733 Obstructive sleep apnea (adult) (pediatric): Secondary | ICD-10-CM

## 2019-05-05 NOTE — Telephone Encounter (Signed)
Pt requesting her CPAP Machine.  Pt states she has the supplies but not the machine.  Please call patient back.

## 2019-05-09 NOTE — Telephone Encounter (Signed)
dme order placed

## 2019-05-09 NOTE — Telephone Encounter (Signed)
Returned phone call to patient, she has her supplies but does not have the machine. I have an odl one but it does not work. I have sent an order to her PCP for this [place], [place] C10/19/20201:31 PM

## 2019-05-10 ENCOUNTER — Telehealth: Payer: Self-pay | Admitting: Internal Medicine

## 2019-05-10 NOTE — Assessment & Plan Note (Signed)
S: CPAP machine is broken, working with [facility] to see if she can qualify for new machine.  A: Severe OSA with AHI of 133  P: Will see if she can obtain a new CPAP machine would prefer autopap pressure setting given her last study was from 2010.  Resumition of CPAP therapy is greatly important given her severe OSA and its effect of her comorbid conditions of CHF, Morbid Obesity, Hypertension.  If she were to have a new machine I am sure she will resume appropriate adherence.

## 2019-05-10 NOTE — Telephone Encounter (Signed)
I do prefer auto-pap setting for her. I have added a/p addendum to September note.

## 2019-05-10 NOTE — Telephone Encounter (Signed)
Pt would like to talk some medications, pls contact [phone]

## 2019-05-10 NOTE — Progress Notes (Signed)
Patient requested help with Xarelto access, she did not qualify for the patient assistance program. Will provide samples tomorrow.

## 2019-05-10 NOTE — Telephone Encounter (Signed)
Received: Today Message Contents  Loni Beckwith, Marcial Pacas, [place], [place]; [place], [place]; [place], Germain Osgood, Orvis Brill, RN        In reviewing the paperwork while I was pulling it, we are needing the following in order to move forward:   -Rx for Replacement CPAP w/pressure setting. Default auto settings are 4-20, but can be any range in between  -Current office visit notes that speak of the patients usage and benefit of PAP therapy. Neither the notes from September or July contain this documentation.   Please advise when this documentation is available for me to pull.  Thank you.

## 2019-05-11 ENCOUNTER — Encounter: Payer: Self-pay | Admitting: Pharmacist

## 2019-05-11 NOTE — Telephone Encounter (Signed)
[name], Breanna Wilkins, [place]; [place], [place]; Herbert Moors, Germain Osgood, Orvis Brill, RN        Got it.   Previous Messages  ----- Message -----  From: Judge Stall, [place]  Sent: 05/11/2019  8:52 AM EDT  To: Darlina Guys, Elon Alas, *  Subject: updated september note              Information added to note,on patient [name], Breanna C10/21/20208:55 AM

## 2019-05-11 NOTE — Progress Notes (Signed)
Medication Samples have been provided to the patient.  Drug name: Xarelto       Strength: 10 mg        Qty: 4  LOT: [id number]  Exp.Date: 12/22  Dosing instructions: take 2 tablets daily with a meal  The patient has been instructed regarding the correct time, dose, and frequency of taking this medication, including desired effects and most common side effects.   Breanna Wilkins 11:45 AM 05/11/2019

## 2019-05-13 ENCOUNTER — Telehealth: Payer: Self-pay | Admitting: Internal Medicine

## 2019-05-13 NOTE — Telephone Encounter (Signed)
Pt is looking from some samples from [employer]; pls cotnact [phone]

## 2019-05-17 ENCOUNTER — Other Ambulatory Visit: Payer: Self-pay | Admitting: Cardiology

## 2019-05-18 NOTE — Telephone Encounter (Signed)
Advised patient to pick up samples 05/11/2019 but patient never showed. Will try again another day.

## 2019-05-23 ENCOUNTER — Telehealth: Payer: Self-pay | Admitting: Internal Medicine

## 2019-05-23 DIAGNOSIS — Z7901 Long term (current) use of anticoagulants: Secondary | ICD-10-CM

## 2019-05-23 DIAGNOSIS — I82533 Chronic embolism and thrombosis of popliteal vein, bilateral: Secondary | ICD-10-CM

## 2019-05-23 DIAGNOSIS — I2609 Other pulmonary embolism with acute cor pulmonale: Secondary | ICD-10-CM

## 2019-05-23 NOTE — Telephone Encounter (Signed)
Refill Request- Pt requesting a Sample- Pt states she was to contact Mannie Stabile about getting a sample before running out. Pt also requesting a call back.  rivaroxaban (XARELTO) 20 MG TABS tablet

## 2019-05-26 NOTE — Telephone Encounter (Signed)
Spoke to pt, gave her dr [employer] plan, she will call for issues, she is agreeable

## 2019-05-27 NOTE — Telephone Encounter (Addendum)
Call from pt requesting samples-will be out on Monday-unable to afford (greater than $300 for 90day supply). Of note, sample room does not have any xarelto 10 or 20mg  tabs on hand. Please advise.Reg[place] Skeeter, Curlee Bogan Cassady11/6/202011:56 AM

## 2019-05-27 NOTE — Addendum Note (Signed)
Addended by: Marcelino Duster on: 05/27/2019 11:59 AM   Modules accepted: Orders

## 2019-05-30 NOTE — Telephone Encounter (Signed)
Received TC from pt, states she has not heard anything from [facility] regarding samples of Xarelto and will be taking her last dose of Xarelto today.  Per telephone encounter on 11/5, triage spoke with pt about Dr. Julianne Rice plan, pt states she hasn't heard from anyone.  Will forward to Dr. Maudie Mercury and triage. SChaplin, RN,BSN

## 2019-05-31 ENCOUNTER — Telehealth: Payer: Self-pay | Admitting: *Deleted

## 2019-05-31 MED FILL — XARELTO 20 MG TABLET: 20 | 30 days supply | Qty: 30 | Fill #0

## 2019-05-31 NOTE — Telephone Encounter (Signed)
Spoke w/ dr Maudie Mercury, called pharmacy spoke to [facility] and pt will get 1 month supply of xarelto thru [facility], will work on another path 12/3 at her appt.

## 2019-05-31 NOTE — Telephone Encounter (Signed)
Pt called this morning requesting Xarelto samples; we only have 15 mg tablets. Stated she will take her last pill this afternoon. Talked to helen, stated she will send a message to Dr Maudie Mercury.

## 2019-06-02 NOTE — Telephone Encounter (Signed)
RE: CPAP Received: Today Message Contents  Horald Chestnut, CMA; Greer Pickerel, Rulon Abide, this Replacement CPAP order is being worked on by another team and they have advised that they need an Rx with the pressure setting on it. I looked at the orders in Epic and all it states is auto titration. The patient advised that her pressure setting is 7. If the provider wants her to be on an auto setting, he needs to advise the pressure range we are to use. The default on a home CPAP is 4-20, but can be any range in between.  Please advise when a revised Rx is available.

## 2019-06-02 NOTE — Telephone Encounter (Signed)
CPAP Received: Today Message Contents  Marcelino Duster, [place], [place] [facility]  Female, 58 y.o., [birth year]  MRN: [id number]     Anderson Malta,  Can you review cpap request for the above patient and let me know if any additional information is needed.  I had contacted patient about medications and she asked that I follow up on her cpap order. My colleagues had already started the process, but when I called this morning-the [employer] Rep did not see an order. Can you please look into this for me? I think the order was placed on 05/09/19 for a cpap replacement. Thanks in advance for your help.    Teea Ducey "Kaye" [place]  [phone]

## 2019-06-06 ENCOUNTER — Other Ambulatory Visit: Payer: Self-pay | Admitting: Internal Medicine

## 2019-06-06 DIAGNOSIS — I82533 Chronic embolism and thrombosis of popliteal vein, bilateral: Secondary | ICD-10-CM

## 2019-06-06 DIAGNOSIS — Z7901 Long term (current) use of anticoagulants: Secondary | ICD-10-CM

## 2019-06-06 DIAGNOSIS — I2609 Other pulmonary embolism with acute cor pulmonale: Secondary | ICD-10-CM

## 2019-06-06 MED ORDER — RIVAROXABAN 20 MG PO TABS: 20.0000 mg | ORAL_TABLET | Freq: Every day | ORAL | 3 refills | Status: DC

## 2019-06-06 MED ORDER — OZEMPIC (0.25 OR 0.5 MG/DOSE) 2 MG/1.5ML ~~LOC~~ SOPN: 0.2500 mg | PEN_INJECTOR | SUBCUTANEOUS | 3 refills | Status: DC

## 2019-06-06 NOTE — Telephone Encounter (Signed)
Needs refill on rivaroxaban (XARELTO) 20 MG TABS tablet  Semaglutide,0.25 or 0.5MG /DOS, (OZEMPIC, 0.25 OR 0.5 MG/DOSE,) 2 MG/1.5ML SOPN (if this is the shot for weight loss)  ;pt contact [place], [place].

## 2019-06-12 ENCOUNTER — Other Ambulatory Visit: Payer: Self-pay | Admitting: Cardiology

## 2019-06-14 ENCOUNTER — Other Ambulatory Visit: Payer: Self-pay | Admitting: *Deleted

## 2019-06-14 MED ORDER — ROSUVASTATIN CALCIUM 5 MG PO TABS: 5.0000 mg | ORAL_TABLET | Freq: Every day | ORAL | 3 refills | Status: DC

## 2019-06-23 ENCOUNTER — Ambulatory Visit
Admission: RE | Admit: 2019-06-23 | Discharge: 2019-06-23 | Disposition: A | Discharge status: Home / Self Care | Payer: Medicare Other | Source: Ambulatory Visit | Attending: Internal Medicine | Admitting: Internal Medicine

## 2019-06-23 ENCOUNTER — Encounter: Payer: Self-pay | Admitting: Internal Medicine

## 2019-06-23 ENCOUNTER — Other Ambulatory Visit: Payer: Self-pay

## 2019-06-23 ENCOUNTER — Ambulatory Visit (INDEPENDENT_AMBULATORY_CARE_PROVIDER_SITE_OTHER): Payer: Medicare Other | Admitting: Internal Medicine

## 2019-06-23 VITALS — BP 141/85 | HR 64 | Temp 97.7°F | Ht 67.0 in | Wt 313.0 lb

## 2019-06-23 DIAGNOSIS — M25519 Pain in unspecified shoulder: Secondary | ICD-10-CM

## 2019-06-23 DIAGNOSIS — I10 Essential (primary) hypertension: Secondary | ICD-10-CM

## 2019-06-23 DIAGNOSIS — Z79899 Other long term (current) drug therapy: Secondary | ICD-10-CM

## 2019-06-23 DIAGNOSIS — M1711 Unilateral primary osteoarthritis, right knee: Secondary | ICD-10-CM

## 2019-06-23 DIAGNOSIS — Z79891 Long term (current) use of opiate analgesic: Secondary | ICD-10-CM

## 2019-06-23 DIAGNOSIS — G4733 Obstructive sleep apnea (adult) (pediatric): Secondary | ICD-10-CM

## 2019-06-23 DIAGNOSIS — M25461 Effusion, right knee: Secondary | ICD-10-CM | POA: Diagnosis not present

## 2019-06-23 DIAGNOSIS — G8929 Other chronic pain: Secondary | ICD-10-CM | POA: Diagnosis not present

## 2019-06-23 DIAGNOSIS — Z6841 Body Mass Index (BMI) 40.0 and over, adult: Secondary | ICD-10-CM

## 2019-06-23 DIAGNOSIS — M549 Dorsalgia, unspecified: Secondary | ICD-10-CM | POA: Diagnosis not present

## 2019-06-23 DIAGNOSIS — Z1231 Encounter for screening mammogram for malignant neoplasm of breast: Secondary | ICD-10-CM | POA: Diagnosis not present

## 2019-06-23 MED ORDER — HYDROCODONE-ACETAMINOPHEN 10-325 MG PO TABS: 1.0000 | ORAL_TABLET | Freq: Four times a day (QID) | ORAL | 0 refills | Status: AC | PRN

## 2019-06-23 MED ORDER — HYDROCODONE-ACETAMINOPHEN 10-325 MG PO TABS: 1.0000 | ORAL_TABLET | Freq: Four times a day (QID) | ORAL | 0 refills | Status: DC | PRN

## 2019-06-23 NOTE — Telephone Encounter (Signed)
(  Pt seen by pcp today)-please provide new dme cpap order with pressure settings if pt still requires cpap machine.Despina Hidden Cassady12/3/20203:28 PM

## 2019-06-24 NOTE — Assessment & Plan Note (Signed)
HPI: She remains on hydrocodone acetaminophen 10-3 25 for the treatment of her chronic pain including right knee back and shoulder pain.  We are trying to manage this in a multimodal fashion.  Continue to stress weight loss as being the #1 focus is most of this pain is related to osteoarthritis.  She does continue to get some benefit from hydrocodone as well as gabapentin prescriptions.  Assessment chronic pain on opioid analgesics  Plan repeat UDS testing today.  I reviewed database and is appropriate.  Refilled 3 months of prescription.

## 2019-06-24 NOTE — Assessment & Plan Note (Signed)
Patient has a history of severe OSA with AHI of 133 back in 2010.  She has not been on CPAP therapy in some time it is previously reported to me that her pressure setting when she last use it was 7 mm of water.  We are in the process of regaining CPAP therapy for her.  Assessment of severe obstructive sleep apnea  Plan Reorder CPAP I cannot find her original study with CPAP titration regardless this would have been over a decade ago I think it is most prudent to use the auto titration feature with pressure range of 4 to 20 mm water

## 2019-06-24 NOTE — Assessment & Plan Note (Signed)
-  Continue semaglutide for weight loss.

## 2019-06-24 NOTE — Progress Notes (Signed)
Subjective:  HPI: [name] is a 58 y.o. female who presents for f/u sleep apnea, HTN, chronic pain  Please see Assessment and Plan below for the status of her chronic medical problems.  Review of Systems: Review of Systems  Constitutional: Negative for fever and malaise/fatigue.  HENT: Negative for hearing loss.   Eyes: Negative for blurred vision.  Respiratory: Negative for cough and shortness of breath.   Cardiovascular: Negative for chest pain.  Gastrointestinal: Negative for abdominal pain and constipation.  Genitourinary: Negative for dysuria.  Musculoskeletal: Positive for back pain and joint pain. Negative for falls and myalgias.  Neurological: Negative for dizziness.  Endo/Heme/Allergies: Negative for polydipsia.    Objective:  Physical Exam: Vitals:   06/23/19 0859  BP: (!) 141/85  Pulse: 64  Temp: 97.7 F (36.5 C)  TempSrc: Oral  SpO2: 100%  Weight: (!) 313 lb (142 kg)  Height: 5\' 7"  (1.702 m)   Body mass index is 49.02 kg/m. Physical Exam Constitutional:      Appearance: Normal appearance. She is obese.  Cardiovascular:     Rate and Rhythm: Normal rate and regular rhythm.  Pulmonary:     Effort: Pulmonary effort is normal.     Breath sounds: Normal breath sounds.  Musculoskeletal:     Right knee: She exhibits effusion (mild). Tenderness found. Medial joint line and lateral joint line tenderness noted.     Left knee: She exhibits no effusion. Tenderness found. Medial joint line tenderness noted. No lateral joint line tenderness noted.     Lumbar back: She exhibits tenderness.     Right lower leg: No edema.     Left lower leg: No edema.  Neurological:     Mental Status: She is alert.  Psychiatric:        Mood and Affect: Mood normal.        Behavior: Behavior normal.        Thought Content: Thought content normal.    Assessment & Plan:  See Encounters Tab for problem based charting.  Medications Ordered Meds ordered this encounter   Medications  . HYDROcodone-acetaminophen (NORCO) 10-325 MG tablet    Sig: Take 1 tablet by mouth every 6 (six) hours as needed for severe pain.    Dispense:  100 tablet    Refill:  0    Rx 3/3  . HYDROcodone-acetaminophen (NORCO) 10-325 MG tablet    Sig: Take 1 tablet by mouth every 6 (six) hours as needed for severe pain.    Dispense:  100 tablet    Refill:  0    Rx 2/3  . HYDROcodone-acetaminophen (NORCO) 10-325 MG tablet    Sig: Take 1 tablet by mouth every 6 (six) hours as needed for severe pain.    Dispense:  100 tablet    Refill:  0    Rx 1/3   Other Orders Orders Placed This Encounter  Procedures  . ToxAssure Select,+Antidepr,UR   Follow Up: Return in about 3 months (around 09/21/2019).  PROCEDURE NOTE  PROCEDURE: right knee joint steroid injection.  PREOPERATIVE DIAGNOSIS: Osteoarthritis of the right knee.  POSTOPERATIVE DIAGNOSIS: Osteoarthritis of the right knee.  PROCEDURE: The patient was apprised of the risks and the benefits of the procedure and informed consent was obtained. Time-out procedure was performed, with confirmation of the patient's name, date of birth, and correct identification of the right knee to be injected. The patient's knee was then marked at the appropriate site for injection placement. The knee was sterilely prepped  with Betadine. A 40 mg (1 milliliter) solution of Kenalog was drawn up into a 5 mL syringe with a 2 mL of 1% lidocaine. The patient was injected with a 25-gauge needle at the anterio medial aspect of her right flexed knee. There were no complications. The patient tolerated the procedure well. There was minimal bleeding. The patient was instructed to ice her knee upon leaving clinic and refrain from overuse over the next 3 days. The patient was instructed to go to the emergency room with any usual pain, swelling, or redness occurred in the injected area. The patient was given a followup appointment to evaluate response to the injection to  his increased range of motion and reduction of pain.

## 2019-06-24 NOTE — Telephone Encounter (Signed)
Community message sent to [employer] that new order was placed.Marland KitchenMarland KitchenRegenia Skeeter, Darlene Cassady12/4/202011:13 AM

## 2019-06-24 NOTE — Assessment & Plan Note (Signed)
HPI: She is requesting a repeat steroid injection in her right knee today.  She has had a lot of crepitus and increased pain in the right knee.  She has plans to eventually get a knee replacement however needs to lose weight we are working on this and she has recently been able to obtain semaglutide again.  A: Right knee OA  P: Steroid injection performed today.

## 2019-06-27 ENCOUNTER — Other Ambulatory Visit: Payer: Self-pay | Admitting: *Deleted

## 2019-06-27 NOTE — Patient Outreach (Signed)
[place] [facility]) Care Management  06/27/2019  Breanna Wilkins 17-Dec-1960 [id number]   RN Health Coach telephone call to patient.  Hipaa compliance verified. Per patient she has not ordered her bood pressure monitor and scale. Patient stated she attempted to order and was told she couldn't but had not used any of her points . Patient requested I sent her another catalog and call back end of next week to see if she attempted to order one.  Plan: RN will follow up within 10 business days.   [place] Care Management [phone]

## 2019-06-29 LAB — TOXASSURE SELECT,+ANTIDEPR,UR

## 2019-07-05 DIAGNOSIS — G4733 Obstructive sleep apnea (adult) (pediatric): Secondary | ICD-10-CM | POA: Diagnosis not present

## 2019-07-08 ENCOUNTER — Other Ambulatory Visit: Payer: Self-pay | Admitting: *Deleted

## 2019-07-08 NOTE — Patient Outreach (Signed)
[place] [facility]) Care Management  07/08/2019  JENEE RAZ 12-22-1960 [id number]  RN Health Coach attempted follow up outreach call to patient.  Patient was unavailable. HIPPA compliance voicemail message left with return callback number.  Plan: RN will call patient again within 30 days.  [place] Management [phone] .

## 2019-07-10 ENCOUNTER — Other Ambulatory Visit: Payer: Self-pay | Admitting: Cardiology

## 2019-07-19 ENCOUNTER — Telehealth: Payer: Self-pay | Admitting: *Deleted

## 2019-07-19 NOTE — Telephone Encounter (Signed)
The following medication records are no longer available for ordering. Place a new order with a different medication record. diclofenac sodium (VOLTAREN) 1 % GEL  Pt is requesting a refill on this medication. Thanks

## 2019-07-20 MED ORDER — DICLOFENAC SODIUM 1 % EX GEL: 4.0000 g | Freq: Four times a day (QID) | CUTANEOUS | 5 refills | Status: DC

## 2019-08-04 DIAGNOSIS — G4733 Obstructive sleep apnea (adult) (pediatric): Secondary | ICD-10-CM | POA: Diagnosis not present

## 2019-08-05 DIAGNOSIS — G4733 Obstructive sleep apnea (adult) (pediatric): Secondary | ICD-10-CM | POA: Diagnosis not present

## 2019-08-08 DIAGNOSIS — G4733 Obstructive sleep apnea (adult) (pediatric): Secondary | ICD-10-CM | POA: Diagnosis not present

## 2019-08-11 ENCOUNTER — Other Ambulatory Visit: Payer: Self-pay | Admitting: *Deleted

## 2019-08-16 NOTE — Patient Outreach (Signed)
[place] [facility]) Care Management  08/11/2019 Late entry  Breanna Wilkins 01-18-1961 [id number]   RN Health Coach telephone call to patient.  Hipaa compliance verified. Per patient she has not gotten a blood pressure or Scale. Patient has not been monitoring her blood pressure. Patient stated she has tried to order her blood pressure monitor and scale and having difficulty getting the order to go through. Per patient she will go and try to purchase a scale before next outreach.  Per patient she has been taking her medications. Patient has agreed to follow up outreach calls.  Plan: RN will send patient a blood pressure monitor RN sent a sheet on how to read blood pressure RN sent a reminder to schedule eye exam RN will follow up within the month of February for compliance  [place] Management [phone]

## 2019-08-18 NOTE — Progress Notes (Signed)
Cardiology Office Note:    Date:  08/19/2019   ID:  DOROTHYMAE Wilkins, DOB 04/02/1961, MRN [id number]  PCP:  Lucious Groves, DO  Cardiologist:  Sinclair Grooms, MD   Referring MD: Lucious Groves, DO   Chief Complaint  Patient presents with  . Coronary Artery Disease    History of Present Illness:    Breanna Wilkins is a 59 y.o. female with a hx of morbid obesity, obstructive sleep apnea, history of recurrent pulmonary emboli requiring chronic Coumadin therapy, chronic diastolic heart failure, and known nonobstructive coronary disease by cath in 2014 at the time of non-ST elevation.  She is doing well.  She denies chest pain, orthopnea, PND, lower extremity swelling, syncope,  Past Medical History:  Diagnosis Date  . Abdominal hernia   . Arthritis   . CAD (coronary artery disease)    Perioperative non-STEMI March, 2014  //   cardiac catheterization at time of non-STEMI, March, 2014, minor nonobstructive coronary disease, vigorous LV function, possibly vasospastic event versus stress cardiomyopathy. No further workup of coronary disease  . CHF (congestive heart failure) ([place])   . Diverticulosis of colon 10/17/2017   Noted on colonoscopy 10/14/17  . Ejection fraction    65-70%, vigorous function, echo, March, 2011  . Ejection fraction    Vigorous function at time of catheterization October 06, 2012,  . History of colonic polyps 10/17/2017   Colonoscopy 10/14/17  . Hyperlipidemia   . Hypertension   . Internal hemorrhoids without complication 123456   Noted on colonoscopy 10/14/17  . Lung nodule 12/2009   Very small, left upper lobe by CT June, 2011, one-year followup was stable  . Myocardial infarction ([place]) 09/2013  . OSA (obstructive sleep apnea)    CPAP  . Overweight(278.02)   . Pulmonary embolus ([place]) 01/16/10   on Coumadin indefinitely  . Shortness of breath   . Shoulder pain   . Sleep apnea   . Warfarin anticoagulation     Past Surgical History:  Procedure  Laterality Date  . ABDOMINAL HYSTERECTOMY  07/2002   laparoscopic assisted vaginal hysterectomy for menorrhagia, dysmenorrhea, anemia, fibroids  . COLOSTOMY    . INCISIONAL HERNIA REPAIR N/A 09/27/2012   Procedure: LAPAROSCOPIC INCISIONAL HERNIA possible open;  Surgeon: Adin Hector, MD;  Location: [facility];  Service: General;  Laterality: N/A;  . INSERTION OF MESH N/A 09/27/2012   Procedure: INSERTION OF MESH;  Surgeon: Adin Hector, MD;  Location: [facility];  Service: General;  Laterality: N/A;  . KNEE ARTHROSCOPY Left   . LEFT HEART CATHETERIZATION WITH CORONARY ANGIOGRAM N/A 10/06/2012   Procedure: LEFT HEART CATHETERIZATION WITH CORONARY ANGIOGRAM;  Surgeon: Sherren Mocha, MD;  Location: [facility] C[facility] LAB;  Service: Cardiovascular;  Laterality: N/A;    Current Medications: Current Meds  Medication Sig  . aspirin 81 MG chewable tablet Chew 1 tablet (81 mg total) by mouth daily.  . carvedilol (COREG) 6.25 MG tablet Take 1 tablet (6.25 mg total) by mouth 2 (two) times daily with a meal.  . diclofenac sodium (VOLTAREN) 1 % GEL Apply 2-4 g topically 4 (four) times daily as needed (pain). Apply 4 g to knees, 2 grams to shoulders as needed.  . furosemide (LASIX) 40 MG tablet Take 1 tablet (40 mg total) by mouth daily.  Marland Kitchen gabapentin (NEURONTIN) 300 MG capsule Take 3 capsules (900 mg total) by mouth 3 (three) times daily.  Derrill Memo ON 08/26/2019] HYDROcodone-acetaminophen (NORCO) 10-325 MG tablet Take 1 tablet  by mouth every 6 (six) hours as needed for severe pain.  Marland Kitchen lidocaine (XYLOCAINE) 5 % ointment Apply 1 application topically 2 (two) times daily as needed.  Marland Kitchen lisinopril (PRINIVIL,ZESTRIL) 40 MG tablet Take 1 tablet (40 mg total) by mouth daily.  . pantoprazole (PROTONIX) 40 MG tablet TAKE 1 TABLET BY MOUTH 30 MINUTES BEFORE LUNCH OR DINNER  . polyethylene glycol (MIRALAX / GLYCOLAX) packet Take 17 g by mouth daily.  . rivaroxaban (XARELTO) 20 MG TABS tablet Take 1 tablet (20 mg total) by mouth  daily with supper.  . rosuvastatin (CRESTOR) 5 MG tablet Take 1 tablet (5 mg total) by mouth at bedtime.  . Semaglutide,0.25 or 0.5MG /DOS, (OZEMPIC, 0.25 OR 0.5 MG/DOSE,) 2 MG/1.5ML SOPN Inject 0.25 mg into the skin once a week.     Allergies:   Patient has no known allergies.   Social History   Socioeconomic History  . Marital status: Single    Spouse name: Not on file  . Number of children: 0  . Years of education: 23  . Highest education level: High school graduate  Occupational History  . Occupation: Disabled  Tobacco Use  . Smoking status: Former Smoker    Quit date: 05/03/1988    Years since quitting: 31.3  . Smokeless tobacco: Never Used  Substance and Sexual Activity  . Alcohol use: Yes    Alcohol/week: 6.0 standard drinks    Types: 6 Cans of beer per week    Comment: Beer sometimes.  . Drug use: Never  . Sexual activity: Not Currently  Other Topics Concern  . Not on file  Social History Narrative   Financial assistance approved for 100% discount at [facility] and has [facility] card per Bonna Gains   12/25/2009   Social Determinants of Health   Financial Resource Strain: Low Risk   . Difficulty of Paying Living Expenses: Not hard at all  Food Insecurity: Unknown  . Worried About Charity fundraiser in the Last Year: Patient refused  . Ran Out of Food in the Last Year: Patient refused  Transportation Needs: Unknown  . Lack of Transportation (Medical): Patient refused  . Lack of Transportation (Non-Medical): Patient refused  Physical Activity: Unknown  . Days of Exercise per Week: Patient refused  . Minutes of Exercise per Session: Patient refused  Stress: No Stress Concern Present  . Feeling of Stress : Not at all  Social Connections: Unknown  . Frequency of Communication with Friends and Family: More than three times a week  . Frequency of Social Gatherings with Friends and Family: Three times a week  . Attends Religious Services: Never  . Active Member of Clubs or  Organizations: No  . Attends Archivist Meetings: Never  . Marital Status: Patient refused     Family History: The patient's family history includes Bone cancer in her maternal grandmother; Colon polyps in her sister; Deep vein thrombosis in her sister; Diabetes in her father, mother, and sister; Ovarian cancer in her mother. There is no history of Breast cancer, Colon cancer, Esophageal cancer, Rectal cancer, or Stomach cancer.  ROS:   Please see the history of present illness.    Not wearing CPAP.  She awakens short of breath.  She is switching out the mask for nasal cannula.  She has not gotten the cannula yet.  I advised her to resume mask until the cannula arrives and to call advanced home care to follow-up.  All other systems reviewed and are negative.  EKGs/Labs/Other Studies Reviewed:    The following studies were reviewed today: No new functional or imaging data  EKG:  EKG normal sinus rhythm with normal appearance.  Recent Labs: 09/30/2018: TSH 0.798 03/24/2019: BUN 21; Creatinine, Ser 1.06; Hemoglobin 12.6; Platelets 289; Potassium 4.4; Sodium 142  Recent Lipid Panel    Component Value Date/Time   CHOL 155 09/30/2018 0935   TRIG 83 09/30/2018 0935   HDL 43 09/30/2018 0935   CHOLHDL 3.6 09/30/2018 0935   CHOLHDL 3.9 10/17/2013 1016   VLDL 20 10/17/2013 1016   LDLCALC 95 09/30/2018 0935    Physical Exam:    VS:  BP 138/74   Pulse 65   Ht 5\' 7"  (1.702 m)   Wt (!) 309 lb (140.2 kg)   SpO2 100%   BMI 48.40 kg/m     Wt Readings from Last 3 Encounters:  08/19/19 (!) 309 lb (140.2 kg)  06/23/19 (!) 313 lb (142 kg)  03/24/19 (!) 322 lb 11.2 oz (146.4 kg)     GEN: Morbid obesity. No acute distress HEENT: Normal NECK: No JVD. LYMPHATICS: No lymphadenopathy CARDIAC: RRR without murmur, gallop, or edema. VASCULAR:  Normal Pulses. No bruits. RESPIRATORY:  Clear to auscultation without rales, wheezing or rhonchi  ABDOMEN: Soft, non-tender, non-distended, No  pulsatile mass, MUSCULOSKELETAL: No deformity  SKIN: Warm and dry NEUROLOGIC:  Alert and oriented x 3 PSYCHIATRIC:  Normal affect   ASSESSMENT:    1. Chronic diastolic HF (heart failure) ([place])   2. Essential hypertension, benign   3. Severe obstructive sleep apnea   4. Other hyperlipidemia   5. Long term (current) use of anticoagulants   6. History of DVT (deep vein thrombosis)   7. Morbid obesity with BMI of 50.0-59.9, adult ([place])   8. Prediabetes   9. History of pulmonary embolism   10. Educated about COVID-19 virus infection    PLAN:    In order of problems listed above:  1. No evidence of volume overload on diuretic therapy. 2. Well-controlled blood pressure 3. Resume facemask CPAP until nasal cannula arrives. 4. LDL target less than 70 is being followed by primary care, Dr. [name] [place] 5. Continue Xarelto.  No current bleeding issues. 6. Not discussed 7. Attempting to lose weight 8. Not discussed and followed by primary care 9. No recurrence 10. COVID-19 vaccine is endorsed by the patient and 3W's is being practiced.  There is no ongoing cardiac issue currently.  She is stable on the medical regimen.  We will convert follow-up to as needed.   Medication Adjustments/Labs and Tests Ordered: Current medicines are reviewed at length with the patient today.  Concerns regarding medicines are outlined above.  Orders Placed This Encounter  Procedures  . EKG 12-Lead   No orders of the defined types were placed in this encounter.   Patient Instructions  Medication Instructions:  Your physician recommends that you continue on your current medications as directed. Please refer to the Current Medication list given to you today.  *If you need a refill on your cardiac medications before your next appointment, please call your pharmacy*  Lab Work: None If you have labs (blood work) drawn today and your tests are completely normal, you will receive your results only  by: Marland Kitchen MyChart Message (if you have MyChart) OR . A paper copy in the mail If you have any lab test that is abnormal or we need to change your treatment, we will call you to review the results.  Testing/Procedures: None  Follow-Up: At [facility], you and your health needs are our priority.  As part of our continuing mission to provide you with exceptional heart care, we have created designated Provider Care Teams.  These Care Teams include your primary Cardiologist (physician) and Advanced Practice Providers (APPs -  Physician Assistants and Nurse Practitioners) who all work together to provide you with the care you need, when you need it.  Your next appointment:   As needed   The format for your next appointment:   In Person  Provider:   You may see Sinclair Grooms, MD or one of the following Advanced Practice Providers on your designated Care Team:    Truitt Merle, NP  Cecilie Kicks, NP  Kathyrn Drown, NP   Other Instructions      Signed, Sinclair Grooms, MD  08/19/2019 9:20 AM    [place]

## 2019-08-19 ENCOUNTER — Encounter (INDEPENDENT_AMBULATORY_CARE_PROVIDER_SITE_OTHER): Payer: Self-pay

## 2019-08-19 ENCOUNTER — Ambulatory Visit (INDEPENDENT_AMBULATORY_CARE_PROVIDER_SITE_OTHER): Payer: Medicare Other | Admitting: Interventional Cardiology

## 2019-08-19 ENCOUNTER — Other Ambulatory Visit: Payer: Self-pay

## 2019-08-19 ENCOUNTER — Encounter: Payer: Self-pay | Admitting: Interventional Cardiology

## 2019-08-19 VITALS — BP 138/74 | HR 65 | Ht 67.0 in | Wt 309.0 lb

## 2019-08-19 DIAGNOSIS — Z86718 Personal history of other venous thrombosis and embolism: Secondary | ICD-10-CM

## 2019-08-19 DIAGNOSIS — I5032 Chronic diastolic (congestive) heart failure: Secondary | ICD-10-CM | POA: Diagnosis not present

## 2019-08-19 DIAGNOSIS — E7849 Other hyperlipidemia: Secondary | ICD-10-CM

## 2019-08-19 DIAGNOSIS — G4733 Obstructive sleep apnea (adult) (pediatric): Secondary | ICD-10-CM

## 2019-08-19 DIAGNOSIS — I10 Essential (primary) hypertension: Secondary | ICD-10-CM

## 2019-08-19 DIAGNOSIS — Z7901 Long term (current) use of anticoagulants: Secondary | ICD-10-CM | POA: Diagnosis not present

## 2019-08-19 DIAGNOSIS — Z6841 Body Mass Index (BMI) 40.0 and over, adult: Secondary | ICD-10-CM

## 2019-08-19 DIAGNOSIS — Z7189 Other specified counseling: Secondary | ICD-10-CM

## 2019-08-19 DIAGNOSIS — R7303 Prediabetes: Secondary | ICD-10-CM

## 2019-08-19 DIAGNOSIS — Z86711 Personal history of pulmonary embolism: Secondary | ICD-10-CM

## 2019-08-19 NOTE — Patient Instructions (Signed)
Medication Instructions:  Your physician recommends that you continue on your current medications as directed. Please refer to the Current Medication list given to you today.  *If you need a refill on your cardiac medications before your next appointment, please call your pharmacy*  Lab Work: None If you have labs (blood work) drawn today and your tests are completely normal, you will receive your results only by: . MyChart Message (if you have MyChart) OR . A paper copy in the mail If you have any lab test that is abnormal or we need to change your treatment, we will call you to review the results.  Testing/Procedures: None  Follow-Up: At [facility], you and your health needs are our priority.  As part of our continuing mission to provide you with exceptional heart care, we have created designated Provider Care Teams.  These Care Teams include your primary Cardiologist (physician) and Advanced Practice Providers (APPs -  Physician Assistants and Nurse Practitioners) who all work together to provide you with the care you need, when you need it.  Your next appointment:   As needed   The format for your next appointment:   In Person  Provider:   You may see Henry W Smith III, MD or one of the following Advanced Practice Providers on your designated Care Team:    Lori Gerhardt, NP  Laura Ingold, NP  Jill McDaniel, NP   Other Instructions   

## 2019-08-26 ENCOUNTER — Other Ambulatory Visit: Payer: Self-pay

## 2019-08-26 DIAGNOSIS — M5432 Sciatica, left side: Secondary | ICD-10-CM

## 2019-08-26 MED ORDER — GABAPENTIN 300 MG PO CAPS: 900.0000 mg | ORAL_CAPSULE | Freq: Three times a day (TID) | ORAL | 5 refills | Status: DC

## 2019-09-05 DIAGNOSIS — G4733 Obstructive sleep apnea (adult) (pediatric): Secondary | ICD-10-CM | POA: Diagnosis not present

## 2019-09-06 ENCOUNTER — Ambulatory Visit (INDEPENDENT_AMBULATORY_CARE_PROVIDER_SITE_OTHER): Payer: Medicare Other | Admitting: Internal Medicine

## 2019-09-06 ENCOUNTER — Other Ambulatory Visit: Payer: Self-pay

## 2019-09-06 VITALS — BP 129/63 | HR 72 | Temp 98.0°F | Ht 67.0 in | Wt 309.8 lb

## 2019-09-06 DIAGNOSIS — X509XXA Other and unspecified overexertion or strenuous movements or postures, initial encounter: Secondary | ICD-10-CM | POA: Diagnosis not present

## 2019-09-06 DIAGNOSIS — S39011A Strain of muscle, fascia and tendon of abdomen, initial encounter: Secondary | ICD-10-CM

## 2019-09-06 DIAGNOSIS — Z79891 Long term (current) use of opiate analgesic: Secondary | ICD-10-CM | POA: Diagnosis not present

## 2019-09-06 MED ORDER — HYDROCODONE-ACETAMINOPHEN 10-325 MG PO TABS: 1.0000 | ORAL_TABLET | Freq: Four times a day (QID) | ORAL | 0 refills | Status: DC | PRN

## 2019-09-06 NOTE — Progress Notes (Signed)
   CC: Lower abdominal pain  HPI:  Breanna Wilkins is a 59 y.o. year-old female with PMH listed below who presents to clinic for lower abdominal pain. Please see problem based assessment and plan for further details.   Past Medical History:  Diagnosis Date  . Abdominal hernia   . Arthritis   . CAD (coronary artery disease)    Perioperative non-STEMI March, 2014  //   cardiac catheterization at time of non-STEMI, March, 2014, minor nonobstructive coronary disease, vigorous LV function, possibly vasospastic event versus stress cardiomyopathy. No further workup of coronary disease  . CHF (congestive heart failure) ([place])   . Diverticulosis of colon 10/17/2017   Noted on colonoscopy 10/14/17  . Ejection fraction    65-70%, vigorous function, echo, March, 2011  . Ejection fraction    Vigorous function at time of catheterization October 06, 2012,  . History of colonic polyps 10/17/2017   Colonoscopy 10/14/17  . Hyperlipidemia   . Hypertension   . Internal hemorrhoids without complication 123456   Noted on colonoscopy 10/14/17  . Lung nodule 12/2009   Very small, left upper lobe by CT June, 2011, one-year followup was stable  . Myocardial infarction ([place]) 09/2013  . OSA (obstructive sleep apnea)    CPAP  . Overweight(278.02)   . Pulmonary embolus ([place]) 01/16/10   on Coumadin indefinitely  . Shortness of breath   . Shoulder pain   . Sleep apnea   . Warfarin anticoagulation    Review of Systems:   Review of Systems  Constitutional: Negative for chills, fever and malaise/fatigue.  Gastrointestinal: Negative for abdominal pain, blood in stool, constipation, diarrhea, melena, nausea and vomiting.  Genitourinary: Negative for dysuria, frequency, hematuria and urgency.    Physical Exam:  Vitals:   09/06/19 0920  BP: 129/63  Pulse: 72  Temp: 98 F (36.7 C)  TempSrc: Oral  SpO2: 100%  Weight: (!) 309 lb 12.8 oz (140.5 kg)  Height: 5\' 7"  (1.702 m)    General: Well-appearing  female in no acute distress Abd: Soft, nontender nondistended, normoactive bowel sounds MSK:   No tenderness to palpation over pubic symphysis   Assessment & Plan:   See Encounters Tab for problem based charting.  Patient discussed with Dr. Angelia Mould

## 2019-09-06 NOTE — Progress Notes (Signed)
Internal Medicine Clinic Attending  Case discussed with Dr. Santos-Sanchez at the time of the visit.  We reviewed the resident's history and exam and pertinent patient test results.  I agree with the assessment, diagnosis, and plan of care documented in the resident's note.    

## 2019-09-06 NOTE — Addendum Note (Signed)
Addended by: Lucious Groves on: 09/06/2019 02:27 PM   Modules accepted: Orders

## 2019-09-06 NOTE — Patient Instructions (Addendum)
Breanna Wilkins,   The pain in your abdomen is due to a rectus muscle strain.  This is one of the muscles in your belly.  This will continue to improve with time but expect to be sore for the next few days.  I spoke to Dr. [name] [place] about your pain medicine and he will refill when you are due for one.  Call us if you have any questions or concerns.  -Dr. Frederico Hamman

## 2019-09-06 NOTE — Assessment & Plan Note (Signed)
Patient presents for evaluation of lower abdominal pain that started 5 days ago.  She was reaching for her phone from the bed and felt a sudden sharp pain in her lower abdomen right above her pelvis.The pain lasted a few minutes but she describes it as very intense.  She denies other GI and urinary symptoms.  Since then she has been experiencing pain whenever she stands up or stretches.  The pain resolves when she bends over.  She is on chronic opiate therapy and took 2 extra tablets of norco on Thursday and Friday to help with the pain.  Today she reports the pain is almost completely resolved and she is feeling back to her baseline.  I suspect patient she had a rectus muscle strain that is improving.  Provided reassurance.

## 2019-09-13 DIAGNOSIS — G4733 Obstructive sleep apnea (adult) (pediatric): Secondary | ICD-10-CM | POA: Diagnosis not present

## 2019-09-14 ENCOUNTER — Other Ambulatory Visit: Payer: Self-pay | Admitting: *Deleted

## 2019-09-14 NOTE — Patient Outreach (Signed)
[facility]) Care Management  [place]  09/14/2019   Breanna Wilkins [birth year] [id number]   RN Health Coach telephone call to patient.  [employer] compliance verified. Patient has received blood pressure monitor that [place] had sent. Patient is having some problems with CPAP. RN trouble shoot issues and helped patient to resolve. Patient will be taking COVID vaccine when available for her age group  After speaking with her physician. Patient is trying to eat low sodium foods.  Patient has not purchased a scale. Per patient she will purchase one this weekend. Patient has agreed to further outreach calls.    Encounter Medications:  Outpatient Encounter Medications as of 09/14/2019  Medication Sig  . aspirin 81 MG chewable tablet Chew 1 tablet (81 mg total) by mouth daily.  . carvedilol (COREG) 6.25 MG tablet Take 1 tablet (6.25 mg total) by mouth 2 (two) times daily with a meal.  . diclofenac sodium (VOLTAREN) 1 % GEL Apply 2-4 g topically 4 (four) times daily as needed (pain). Apply 4 g to knees, 2 grams to shoulders as needed.  . furosemide (LASIX) 40 MG tablet Take 1 tablet (40 mg total) by mouth daily.  Marland Kitchen gabapentin (NEURONTIN) 300 MG capsule Take 3 capsules (900 mg total) by mouth 3 (three) times daily.  Derrill Memo ON 09/26/2019] HYDROcodone-acetaminophen (NORCO) 10-325 MG tablet Take 1 tablet by mouth every 6 (six) hours as needed for severe pain.  . Insulin Pen Needle (NOVOFINE) 32G X 6 MM MISC 1 Units by Does not apply route daily.  Marland Kitchen lidocaine (XYLOCAINE) 5 % ointment Apply 1 application topically 2 (two) times daily as needed.  Marland Kitchen lisinopril (PRINIVIL,ZESTRIL) 40 MG tablet Take 1 tablet (40 mg total) by mouth daily.  . pantoprazole (PROTONIX) 40 MG tablet TAKE 1 TABLET BY MOUTH 30 MINUTES BEFORE LUNCH OR DINNER  . polyethylene glycol (MIRALAX / GLYCOLAX) packet Take 17 g by mouth daily.  . rivaroxaban (XARELTO) 20 MG TABS tablet Take 1 tablet (20 mg total)  by mouth daily with supper.  . rosuvastatin (CRESTOR) 5 MG tablet Take 1 tablet (5 mg total) by mouth at bedtime.  . Semaglutide,0.25 or 0.5MG /DOS, (OZEMPIC, 0.25 OR 0.5 MG/DOSE,) 2 MG/1.5ML SOPN Inject 0.25 mg into the skin once a week. (Patient not taking: Reported on 09/14/2019)   No facility-administered encounter medications on file as of 09/14/2019.    Functional Status:  In your present state of health, do you have any difficulty performing the following activities: 09/14/2019 09/06/2019  Hearing? N N  Vision? N N  Difficulty concentrating or making decisions? N N  Walking or climbing stairs? Y Y  Comment Obesity, knee pain -  Dressing or bathing? N N  Doing errands, shopping? N N  Preparing Food and eating ? N -  Using the Toilet? N -  In the past six months, have you accidently leaked urine? N -  Do you have problems with loss of bowel control? N -  Managing your Medications? N -  Managing your Finances? N -  Housekeeping or managing your Housekeeping? N -  Some recent data might be hidden    Fall/Depression Screening: Fall Risk  09/14/2019 09/06/2019 06/23/2019  Falls in the past year? 0 0 0  Number falls in past yr: 0 - -  Injury with Fall? 0 - -  Risk for fall due to : - - -  Risk for fall due to: Comment - - -  Follow up Falls  evaluation completed Falls prevention discussed Falls prevention discussed   PHQ 2/9 Scores 09/14/2019 09/06/2019 06/23/2019 04/26/2019 03/24/2019 02/22/2019 02/10/2019  PHQ - 2 Score 0 0 0 0 0 0 0  PHQ- 9 Score - - - - - - -   THN CM Care Plan Problem One     Most Recent Value  Care Plan Problem One  Knowledge Deficit in Self Management of Hypertension  Role Documenting the Problem One  [place] for Problem One  Active  THN Long Term Goal   Patient will be able to do self monitoring at home within the next 90 days.RN sent a matter of choice blood pressure control. RN will follow up with further discussion  THN Long Term Goal Start Date   09/14/19  Interventions for Problem One Long Term Goal  Patient has now received the blood pressure monitor that the [place] has sent. Patient has calendar book and will start monitoring blood pressures and documenting. RN will follow up for compliance  THN CM Short Term Goal #1   Patient will verbalize receiving a chart on high and low sodium foods within the 30 days  THN CM Short Term Goal #2   Patient will verbalize understanding labels on  THN CM Short Term Goal #2 Start Date  09/14/19  Interventions for Short Term Goal #2  RN reiterated the importance of monitoring the sodium and rading the labels. Patient needs to get use to reading them and calculating how much she is taking in. RN will follow up on progress.  THN CM Short Term Goal #3  patient will voce a better understanding of portion control within the next 30 days  THN CM Short Term Goal #3 Start Date  09/14/19  Interventions for Short Tern Goal #3  RN reiterates using portion control to help with weight loss. RN will follow up with monitoring progress      Assessment:  Patient has received BP monitor Patient will purchase Scale this weekend Patient will contact physician regarding medication she has stopped Patient has scheduled eye exam for April 1st Patient will continue benefit from [place] telephonic outreach for education and support for hypertension  self management. Plan: RN discussed monitoring blood pressure Patient has calendar book to monitor blood pressure in Patient will monitor sodium intake in diet RN sent EMMI education Hypertension and health problems RN sent EMMI education Controlling your blood pressure through lifestyle RN will follow up within the month of April to see if patient has all equipment and monitoring wt and BP   Echo Management [phone]

## 2019-10-17 ENCOUNTER — Telehealth: Payer: Self-pay

## 2019-10-17 MED ORDER — CYCLOBENZAPRINE HCL 7.5 MG PO TABS: 7.5000 mg | ORAL_TABLET | Freq: Three times a day (TID) | ORAL | 1 refills | Status: DC | PRN

## 2019-10-17 NOTE — Telephone Encounter (Signed)
Requesting a refill on muscle relaxer @  [facility], [place]. [phone] (Phone) [phone] (Fax)

## 2019-10-20 ENCOUNTER — Ambulatory Visit (INDEPENDENT_AMBULATORY_CARE_PROVIDER_SITE_OTHER): Payer: Medicare Other | Admitting: Internal Medicine

## 2019-10-20 ENCOUNTER — Encounter: Payer: Self-pay | Admitting: Internal Medicine

## 2019-10-20 ENCOUNTER — Ambulatory Visit: Payer: Medicare Other

## 2019-10-20 VITALS — BP 168/92 | HR 68 | Temp 97.6°F | Ht 67.0 in | Wt 310.1 lb

## 2019-10-20 DIAGNOSIS — G8929 Other chronic pain: Secondary | ICD-10-CM | POA: Diagnosis not present

## 2019-10-20 DIAGNOSIS — Z79899 Other long term (current) drug therapy: Secondary | ICD-10-CM | POA: Diagnosis not present

## 2019-10-20 DIAGNOSIS — H0102A Squamous blepharitis right eye, upper and lower eyelids: Secondary | ICD-10-CM | POA: Diagnosis not present

## 2019-10-20 DIAGNOSIS — I10 Essential (primary) hypertension: Secondary | ICD-10-CM

## 2019-10-20 DIAGNOSIS — M1711 Unilateral primary osteoarthritis, right knee: Secondary | ICD-10-CM | POA: Diagnosis not present

## 2019-10-20 DIAGNOSIS — H538 Other visual disturbances: Secondary | ICD-10-CM | POA: Diagnosis not present

## 2019-10-20 DIAGNOSIS — Z79891 Long term (current) use of opiate analgesic: Secondary | ICD-10-CM | POA: Diagnosis not present

## 2019-10-20 DIAGNOSIS — H0520 Unspecified exophthalmos: Secondary | ICD-10-CM | POA: Diagnosis not present

## 2019-10-20 DIAGNOSIS — H04123 Dry eye syndrome of bilateral lacrimal glands: Secondary | ICD-10-CM | POA: Diagnosis not present

## 2019-10-20 DIAGNOSIS — Q078 Other specified congenital malformations of nervous system: Secondary | ICD-10-CM | POA: Diagnosis not present

## 2019-10-20 MED ORDER — HYDROCODONE-ACETAMINOPHEN 10-325 MG PO TABS: 1.0000 | ORAL_TABLET | Freq: Four times a day (QID) | ORAL | 0 refills | Status: AC | PRN

## 2019-10-20 MED ORDER — HYDROCODONE-ACETAMINOPHEN 10-325 MG PO TABS: 1.0000 | ORAL_TABLET | Freq: Four times a day (QID) | ORAL | 0 refills | Status: DC | PRN

## 2019-10-20 NOTE — Assessment & Plan Note (Signed)
HPI: She continues to use hydrocodone acetaminophen for the chronic pain of her right knee right shoulder low back.  She needs to feel that this is beneficial for her to continue working as well as caring for self.  She does report to me that she is in the process of trying to move back to [place] to spend more time with her sisters and to make less strain on commuting.  She has had no adverse events from her opioid therapy.  Assessment chronic pain long-term use of opioid analgesics  Plan Continue hydrocodone acetaminophen 10-3 25 number 100 pills/month. We also recently added back Flexeril 7.5 mg to use for her back spasms.  Discussed caution with using these medications as well as her gabapentin.

## 2019-10-20 NOTE — Progress Notes (Signed)
Subjective:  HPI: [name] is a 59 y.o. female who presents for f/u OA, HTN, Chronic pain  Please see Assessment and Plan below for the status of her chronic medical problems.  Review of Systems: Review of Systems  Constitutional: Negative for fever.  Respiratory: Negative for cough and shortness of breath.   Cardiovascular: Negative for chest pain.  Genitourinary: Negative for dysuria.  Musculoskeletal: Positive for back pain, joint pain and neck pain. Negative for falls and myalgias.  Neurological: Negative for dizziness.  Endo/Heme/Allergies: Negative for polydipsia.    Objective:  Physical Exam: Vitals:   10/20/19 0926  BP: (!) 168/92  Pulse: 68  Temp: 97.6 F (36.4 C)  TempSrc: Oral  SpO2: 100%  Weight: (!) 310 lb 1.6 oz (140.7 kg)  Height: 5\' 7"  (1.702 m)   Body mass index is 48.57 kg/m. Physical Exam Constitutional:      Appearance: She is obese.  HENT:     Head: Normocephalic and atraumatic.  Cardiovascular:     Rate and Rhythm: Normal rate and regular rhythm.  Pulmonary:     Effort: Pulmonary effort is normal.     Breath sounds: Normal breath sounds. No wheezing.  Musculoskeletal:     Right knee: Effusion present. Tenderness present over the medial joint line and lateral joint line. No LCL laxity or MCL laxity. Normal meniscus.     Left knee: No tenderness. No LCL laxity or MCL laxity.    Right lower leg: No edema.     Left lower leg: No edema.  Psychiatric:        Mood and Affect: Mood normal.        Behavior: Behavior normal.    Assessment & Plan:  See Encounters Tab for problem based charting.  Medications Ordered Meds ordered this encounter  Medications  . HYDROcodone-acetaminophen (NORCO) 10-325 MG tablet    Sig: Take 1 tablet by mouth every 6 (six) hours as needed for severe pain.    Dispense:  100 tablet    Refill:  0    Rx 1/3  . HYDROcodone-acetaminophen (NORCO) 10-325 MG tablet    Sig: Take 1 tablet by mouth every 6 (six)  hours as needed for severe pain.    Dispense:  100 tablet    Refill:  0    Rx 2/3  . HYDROcodone-acetaminophen (NORCO) 10-325 MG tablet    Sig: Take 1 tablet by mouth every 6 (six) hours as needed for severe pain.    Dispense:  100 tablet    Refill:  0    Rx 3/3   I also spent some time discussing the coronavirus vaccines she is about to take a trip to [facility].  I was able to assist her in signing up for a vaccination later today.  I discussed that she will need to continue safety precautions.  Other Orders No orders of the defined types were placed in this encounter.  Follow Up: Return in about 3 months (around 01/19/2020).  PROCEDURE NOTE  PROCEDURE: right knee joint steroid injection.  PREOPERATIVE DIAGNOSIS: Osteoarthritis of the right knee.  POSTOPERATIVE DIAGNOSIS: Osteoarthritis of the right knee.  PROCEDURE: The patient was apprised of the risks and the benefits of the procedure and informed consent was obtained, as witnessed by [facility]. Time-out procedure was performed, with confirmation of the patient's name, date of birth, and correct identification of the right knee to be injected. The patient's knee was then marked at the appropriate site for injection placement. The  knee was sterilely prepped with Betadine. A 49 mg (1 milliliter) solution of Kenalog was drawn up into a 5 mL syringe with a 2 mL of 1% lidocaine. The patient was injected with a 25-gauge needle at the medial aspect of her right flexed knee. There were no complications. The patient tolerated the procedure well. There was minimal bleeding. The patient was instructed to ice her knee upon leaving clinic and refrain from overuse over the next 3 days. The patient was instructed to go to the emergency room with any usual pain, swelling, or redness occurred in the injected area. The patient was given a followup appointment to evaluate response to the injection to his increased range of motion and reduction of pain.

## 2019-10-20 NOTE — Assessment & Plan Note (Signed)
HPI: She reports she continues to take her lisinopril 40 mg daily as well as the carvedilol 6.25 twice daily.  Blood pressure has been well controlled at home she feels like it is old with more elevated today due to rushing in for appointment.  Assessment essential hypertension above goal  Plan Discussed the importance of blood pressure control she will check her blood pressure at the pharmacy and call me sooner than her next appointment if it remains elevated as we will need to adjust her antihypertensives.

## 2019-10-20 NOTE — Assessment & Plan Note (Signed)
HPI: She is requesting a knee injection for her right knee pain.  Pain has been worse over the past 2 months or so and previous knee injections have been very beneficial.  Assessment osteoarthritis of the right knee  Plan Performed steroid injection to the right knee.

## 2019-10-28 DIAGNOSIS — H0520 Unspecified exophthalmos: Secondary | ICD-10-CM | POA: Diagnosis not present

## 2019-10-28 DIAGNOSIS — Q078 Other specified congenital malformations of nervous system: Secondary | ICD-10-CM | POA: Diagnosis not present

## 2019-10-28 DIAGNOSIS — H04123 Dry eye syndrome of bilateral lacrimal glands: Secondary | ICD-10-CM | POA: Diagnosis not present

## 2019-11-10 ENCOUNTER — Other Ambulatory Visit: Payer: Self-pay | Admitting: *Deleted

## 2019-11-11 NOTE — Patient Outreach (Signed)
[facility]) Care Management  [place]  11/11/2019   Breanna Wilkins 01/15/1961 [id number]  RN Health Coach telephone call to patient.  [employer] compliance verified.  Per patient she is working part time. Patient has not been checking her blood pressure as we had discussed. RN pointed out to patient that her blood pressure was elevated at the Dr office. Patient has verified that she has received her monitor. Patient has not been adhering to diet. Per patient she has not gotten a scale. Patient has agreed to follow up outreach calls.   Encounter Medications:  Outpatient Encounter Medications as of 11/10/2019  Medication Sig  . aspirin 81 MG chewable tablet Chew 1 tablet (81 mg total) by mouth daily.  . carvedilol (COREG) 6.25 MG tablet Take 1 tablet (6.25 mg total) by mouth 2 (two) times daily with a meal.  . cyclobenzaprine (FEXMID) 7.5 MG tablet Take 1 tablet (7.5 mg total) by mouth 3 (three) times daily as needed for muscle spasms.  . diclofenac sodium (VOLTAREN) 1 % GEL Apply 2-4 g topically 4 (four) times daily as needed (pain). Apply 4 g to knees, 2 grams to shoulders as needed.  . furosemide (LASIX) 40 MG tablet Take 1 tablet (40 mg total) by mouth daily.  Marland Kitchen gabapentin (NEURONTIN) 300 MG capsule Take 3 capsules (900 mg total) by mouth 3 (three) times daily.  Marland Kitchen HYDROcodone-acetaminophen (NORCO) 10-325 MG tablet Take 1 tablet by mouth every 6 (six) hours as needed for severe pain.  Derrill Memo ON 11/22/2019] HYDROcodone-acetaminophen (NORCO) 10-325 MG tablet Take 1 tablet by mouth every 6 (six) hours as needed for severe pain.  Derrill Memo ON 12/22/2019] HYDROcodone-acetaminophen (NORCO) 10-325 MG tablet Take 1 tablet by mouth every 6 (six) hours as needed for severe pain.  . Insulin Pen Needle (NOVOFINE) 32G X 6 MM MISC 1 Units by Does not apply route daily.  Marland Kitchen lidocaine (XYLOCAINE) 5 % ointment Apply 1 application topically 2 (two) times daily as needed.  Marland Kitchen lisinopril  (PRINIVIL,ZESTRIL) 40 MG tablet Take 1 tablet (40 mg total) by mouth daily.  . pantoprazole (PROTONIX) 40 MG tablet TAKE 1 TABLET BY MOUTH 30 MINUTES BEFORE LUNCH OR DINNER  . polyethylene glycol (MIRALAX / GLYCOLAX) packet Take 17 g by mouth daily.  . rivaroxaban (XARELTO) 20 MG TABS tablet Take 1 tablet (20 mg total) by mouth daily with supper.  . rosuvastatin (CRESTOR) 5 MG tablet Take 1 tablet (5 mg total) by mouth at bedtime.  . Semaglutide,0.25 or 0.5MG /DOS, (OZEMPIC, 0.25 OR 0.5 MG/DOSE,) 2 MG/1.5ML SOPN Inject 0.25 mg into the skin once a week. (Patient not taking: Reported on 09/14/2019)   No facility-administered encounter medications on file as of 11/10/2019.    Functional Status:  In your present state of health, do you have any difficulty performing the following activities: 10/20/2019 09/14/2019  Hearing? N N  Vision? N N  Difficulty concentrating or making decisions? N N  Walking or climbing stairs? Y Y  Comment - Obesity, knee pain  Dressing or bathing? N N  Doing errands, shopping? N N  Preparing Food and eating ? - N  Using the Toilet? - N  In the past six months, have you accidently leaked urine? - N  Do you have problems with loss of bowel control? - N  Managing your Medications? - N  Managing your Finances? - N  Housekeeping or managing your Housekeeping? - N  Some recent data might be hidden  Fall/Depression Screening: Fall Risk  10/20/2019 09/14/2019 09/06/2019  Falls in the past year? 0 0 0  Number falls in past yr: 0 0 -  Injury with Fall? 0 0 -  Risk for fall due to : - - -  Risk for fall due to: Comment - - -  Follow up - Falls evaluation completed Falls prevention discussed   PHQ 2/9 Scores 10/20/2019 09/14/2019 09/06/2019 06/23/2019 04/26/2019 03/24/2019 02/22/2019  PHQ - 2 Score 0 0 0 0 0 0 0  PHQ- 9 Score 0 - - - - - -   THN CM Care Plan Problem One     Most Recent Value  Care Plan Problem One  Knowledge Deficit in Self Management of Hypertension  Role  Documenting the Problem One  [place] for Problem One  Active  THN Long Term Goal   Patient will be able to do self monitoring at home within the next 90 days.RN sent a matter of choice blood pressure control. RN will follow up with further discussion  Interventions for Problem One Long Term Goal  Patient has received the blood pressure monitor. Patient is not checking the blood pressures and needed. RN reiterated checking and documenting. RN will follow up with further discussion and monitoring compliance.  THN CM Short Term Goal #1   Pateitn will verbalize receiving the scale within the next 30 days  THN CM Short Term Goal #1 Start Date  11/11/19  Interventions for Short Term Goal #1  RN discussed with patient about weighing and documenting. Patient has not gotten a scale. RN Health Coach sent patient a scale. RN will follow up for receiving.  THN CM Short Term Goal #2   Patient will verbalize understanding labels on  Interventions for Short Term Goal #2  RN discussed the complication of hypertension. . RN discussed how monitoring that sodium intakes helps the blood pressure and decreases retaining fluid. RN will follow up for further discussion.  THN CM Short Term Goal #3  patient will voice having a better understanding of portion control within the next 30 days  Interventions for Short Tern Goal #3  RN discussed with patient about using portion control. RN sent educational material on tips for weight loss that included portion control. Rn sent the patient a scale so we can monitor weight loss. RN will follow up with futher discussion  THN CM Short Term Goal #4  Patient will verbalize documenting weights and blood pressure within the next 30 days  THN CM Short Term Goal #4 Start Date  11/11/19  Interventions for Short Term Goal #4  Patient has received the blood pressure cuff but has not started monitoring and documenting blood pressures.Patient has not went and gotten a scale.RN  Health Coach sent patient a scale. RN Health Coach will follow up next outreach for patient weighing and checking blood pressures with documentation.      Assessment:  Patient has not purchased a scale Patient is not monitoring blood pressure at home Patient is not adhering to her diet Patient has not had any recent falls Plan:  RN discussed the importance of monitoring blood pressure and documenting RN sent patient a scale RN sent patient EMMI education on weight loss tips RN discussed weight loss diet plan RN will follow up within the month of May  Breanna Wilkins [place] [facility] [phone]

## 2019-11-22 ENCOUNTER — Other Ambulatory Visit: Payer: Self-pay | Admitting: *Deleted

## 2019-11-22 MED ORDER — LISINOPRIL 40 MG PO TABS: 40.0000 mg | ORAL_TABLET | Freq: Every day | ORAL | 3 refills | Status: DC

## 2019-11-25 ENCOUNTER — Other Ambulatory Visit: Payer: Self-pay | Admitting: *Deleted

## 2019-11-25 DIAGNOSIS — Z7901 Long term (current) use of anticoagulants: Secondary | ICD-10-CM

## 2019-11-25 DIAGNOSIS — I2609 Other pulmonary embolism with acute cor pulmonale: Secondary | ICD-10-CM

## 2019-11-25 DIAGNOSIS — I82533 Chronic embolism and thrombosis of popliteal vein, bilateral: Secondary | ICD-10-CM

## 2019-11-25 MED ORDER — RIVAROXABAN 20 MG PO TABS: 20.0000 mg | ORAL_TABLET | Freq: Every day | ORAL | 0 refills | Status: DC

## 2019-11-25 MED ORDER — HYDROCODONE-ACETAMINOPHEN 10-325 MG PO TABS: 1.0000 | ORAL_TABLET | Freq: Four times a day (QID) | ORAL | 0 refills | Status: DC | PRN

## 2019-12-13 ENCOUNTER — Other Ambulatory Visit: Payer: Self-pay | Admitting: *Deleted

## 2019-12-13 NOTE — Patient Outreach (Signed)
[facility]) Care Management  [place]  12/13/2019   Breanna Wilkins 1960/10/25 [id number]  RN Health Coach telephone call to patient.  Hip[employer] compliance verified. Per patient she has received her scale and blood pressure monitor  RN health Coach provided.  RN discussed with patient about monitoring  weight and blood pressure and documenting at least once a week in the calendar book that was provided. Patient is taking her medications as prescribed. Patient is still working part time and has not gotten into an exercise routine. Patient does not have a medical alert system. Patient has agreed to further out reach calls.   Encounter Medications:  Outpatient Encounter Medications as of 12/13/2019  Medication Sig  . aspirin 81 MG chewable tablet Chew 1 tablet (81 mg total) by mouth daily.  . carvedilol (COREG) 6.25 MG tablet Take 1 tablet (6.25 mg total) by mouth 2 (two) times daily with a meal.  . cyclobenzaprine (FEXMID) 7.5 MG tablet Take 1 tablet (7.5 mg total) by mouth 3 (three) times daily as needed for muscle spasms.  . diclofenac sodium (VOLTAREN) 1 % GEL Apply 2-4 g topically 4 (four) times daily as needed (pain). Apply 4 g to knees, 2 grams to shoulders as needed.  . furosemide (LASIX) 40 MG tablet Take 1 tablet (40 mg total) by mouth daily.  Marland Kitchen gabapentin (NEURONTIN) 300 MG capsule Take 3 capsules (900 mg total) by mouth 3 (three) times daily.  Marland Kitchen HYDROcodone-acetaminophen (NORCO) 10-325 MG tablet Take 1 tablet by mouth every 6 (six) hours as needed for severe pain.  Derrill Memo ON 12/22/2019] HYDROcodone-acetaminophen (NORCO) 10-325 MG tablet Take 1 tablet by mouth every 6 (six) hours as needed for severe pain.  . Insulin Pen Needle (NOVOFINE) 32G X 6 MM MISC 1 Units by Does not apply route daily.  Marland Kitchen lidocaine (XYLOCAINE) 5 % ointment Apply 1 application topically 2 (two) times daily as needed.  Marland Kitchen lisinopril (ZESTRIL) 40 MG tablet Take 1 tablet (40 mg total) by  mouth daily.  . pantoprazole (PROTONIX) 40 MG tablet TAKE 1 TABLET BY MOUTH 30 MINUTES BEFORE LUNCH OR DINNER  . polyethylene glycol (MIRALAX / GLYCOLAX) packet Take 17 g by mouth daily.  . rivaroxaban (XARELTO) 20 MG TABS tablet Take 1 tablet (20 mg total) by mouth daily with supper.  . rosuvastatin (CRESTOR) 5 MG tablet Take 1 tablet (5 mg total) by mouth at bedtime.  . Semaglutide,0.25 or 0.5MG/DOS, (OZEMPIC, 0.25 OR 0.5 MG/DOSE,) 2 MG/1.5ML SOPN Inject 0.25 mg into the skin once a week. (Patient not taking: Reported on 09/14/2019)   No facility-administered encounter medications on file as of 12/13/2019.    Functional Status:  In your present state of health, do you have any difficulty performing the following activities: 10/20/2019 09/14/2019  Hearing? N N  Vision? N N  Difficulty concentrating or making decisions? N N  Walking or climbing stairs? Y Y  Comment - Obesity, knee pain  Dressing or bathing? N N  Doing errands, shopping? N N  Preparing Food and eating ? - N  Using the Toilet? - N  In the past six months, have you accidently leaked urine? - N  Do you have problems with loss of bowel control? - N  Managing your Medications? - N  Managing your Finances? - N  Housekeeping or managing your Housekeeping? - N  Some recent data might be hidden    Fall/Depression Screening: Fall Risk  10/20/2019 09/14/2019 09/06/2019  Falls in  the past year? 0 0 0  Number falls in past yr: 0 0 -  Injury with Fall? 0 0 -  Risk for fall due to : - - -  Risk for fall due to: Comment - - -  Follow up - Falls evaluation completed Falls prevention discussed   PHQ 2/9 Scores 10/20/2019 09/14/2019 09/06/2019 06/23/2019 04/26/2019 03/24/2019 02/22/2019  PHQ - 2 Score 0 0 0 0 0 0 0  PHQ- 9 Score 0 - - - - - -   THN CM Care Plan Problem One     Most Recent Value  Care Plan Problem One  Knowledge Deficit in Self Management of Hypertension  Role Documenting the Problem One  [place] for Problem  One  Active  THN Long Term Goal   Patient will monitor blood pressure at home at least on a weekly basis and document within the next 30 days  THN Long Term Goal Start Date  12/13/19  Interventions for Problem One Long Term Goal  Rn discussed with patient about monitoring blood pressure at least once a week and document. RN Health coach has provided a monitor. RN will follow up outreach for further discussion  THN CM Short Term Goal #1 Met Date  12/13/19  [facility] CM Short Term Goal #2   Patient will verbalize understanding labels on  Interventions for Short Term Goal #2  Patient understands monitoring sodium, but needs to follow through with looking at labels and decreasing the sodium intake. RN will follow up with further discussion and encouraging patient to adhere.  THN CM Short Term Goal #3  patient will voice having a better understanding of portion control within the next 30 days  Interventions for Short Tern Goal #3  RN reiterated portion control. RN sent educational material on healthy meal planning and a scale to help monitor weight loss and progress.  THN CM Short Term Goal #4  Patient will verbalize documenting weights and blood pressure within the next 30 days  Interventions for Short Term Goal #4  Patient has received scale and blood pressure monitor. Patient has a 2021 Calendar book to document results in. RN will follow up for compliance.       Assessment:  Patient has received blood pressure monitor and scale Patient does not have a medical alert system Patient does not have an exercise routine Patient will continue to  benefit from Health Coach telephonic outreach for education and support for hypertension self management. Plan:  Patient will monitor blood pressure and weight and document in calendar book Patient was given [facility] number to inquire about medical alert system RN discussed starting an exercise routine RN sent Exercise Program activity booklet RN sent Planning healthy  meals RN will follow up within the month of July [facility] [phone]

## 2019-12-21 ENCOUNTER — Ambulatory Visit (INDEPENDENT_AMBULATORY_CARE_PROVIDER_SITE_OTHER): Payer: Medicare Other | Admitting: Internal Medicine

## 2019-12-21 ENCOUNTER — Other Ambulatory Visit: Payer: Self-pay

## 2019-12-21 VITALS — BP 129/69 | HR 71 | Temp 97.8°F | Ht 67.0 in | Wt 313.2 lb

## 2019-12-21 DIAGNOSIS — I5022 Chronic systolic (congestive) heart failure: Secondary | ICD-10-CM | POA: Diagnosis not present

## 2019-12-21 DIAGNOSIS — Z86718 Personal history of other venous thrombosis and embolism: Secondary | ICD-10-CM

## 2019-12-21 DIAGNOSIS — M79605 Pain in left leg: Secondary | ICD-10-CM

## 2019-12-21 DIAGNOSIS — M1711 Unilateral primary osteoarthritis, right knee: Secondary | ICD-10-CM | POA: Diagnosis not present

## 2019-12-21 DIAGNOSIS — M79662 Pain in left lower leg: Secondary | ICD-10-CM

## 2019-12-21 LAB — BASIC METABOLIC PANEL
Anion gap: 8 (ref 5–15)
BUN: 28 mg/dL — ABNORMAL HIGH (ref 6–20)
CO2: 30 mmol/L (ref 22–32)
Calcium: 9.8 mg/dL (ref 8.9–10.3)
Chloride: 104 mmol/L (ref 98–111)
Creatinine, Ser: 0.9 mg/dL (ref 0.44–1.00)
GFR calc Af Amer: >60 mL/min (ref >60)
GFR calc non Af Amer: >60 mL/min (ref >60)
Glucose, Bld: 80 mg/dL (ref 70–99)
Potassium: 3.9 mmol/L (ref 3.5–5.1)
Sodium: 142 mmol/L (ref 135–145)

## 2019-12-21 LAB — D-DIMER, QUANTITATIVE: D-Dimer, Quant: 0.42 ug/mL-FEU (ref 0.00–0.50)

## 2019-12-21 MED ORDER — HYDROCODONE-ACETAMINOPHEN 10-325 MG PO TABS: 1.0000 | ORAL_TABLET | Freq: Four times a day (QID) | ORAL | 0 refills | Status: AC | PRN

## 2019-12-21 NOTE — Progress Notes (Signed)
CC: Follow-up of right knee pain, left leg cramps  HPI:  [name] is a 59 y.o. female with PMHx as documented below, presented for follow-up of right knee pain.  He was seen in clinic 2 months ago for right knee pain and received intra-articular steroid injection. She also reports some left leg cramps and posterior aspect of left calf.  Please refer to problem based charting for further details and assessment and plan of current problem and chronic medical conditions.   Past Medical History:  Diagnosis Date  . Abdominal hernia   . Arthritis   . CAD (coronary artery disease)    Perioperative non-STEMI March, 2014  //   cardiac catheterization at time of non-STEMI, March, 2014, minor nonobstructive coronary disease, vigorous LV function, possibly vasospastic event versus stress cardiomyopathy. No further workup of coronary disease  . CHF (congestive heart failure) ([place])   . Diverticulosis of colon 10/17/2017   Noted on colonoscopy 10/14/17  . Ejection fraction    65-70%, vigorous function, echo, March, 2011  . Ejection fraction    Vigorous function at time of catheterization October 06, 2012,  . History of colonic polyps 10/17/2017   Colonoscopy 10/14/17  . Hyperlipidemia   . Hypertension   . Internal hemorrhoids without complication [id number]   Noted on colonoscopy 10/14/17  . Lung nodule 12/2009   Very small, left upper lobe by CT June, 2011, one-year followup was stable  . Myocardial infarction ([place]) 09/2013  . OSA (obstructive sleep apnea)    CPAP  . Overweight(278.02)   . Pulmonary embolus ([place]) 01/16/10   on Coumadin indefinitely  . Shortness of breath   . Shoulder pain   . Sleep apnea   . Warfarin anticoagulation    Review of Systems:   Constitutional: Negative for chills and fever.  Respiratory: Negative for shortness of breath.   Cardiovascular: Negative for chest pain and leg swelling.  Gastrointestinal: Negative for abdominal pain, nausea and vomiting.    Neurological: Negative for dizziness and headaches. MSK: Positive for right knee pain.  Positive for right knee and left calf pain.  Negative for unilateral leg swelling.    Physical Exam:  Vitals:   12/21/19 1006  BP: 129/69  Pulse: 71  Temp: 97.8 F (36.6 C)  TempSrc: Oral  SpO2: 99%  Weight: (!) 313 lb 3.2 oz (142.1 kg)  Height: 5\' 7"  (1.702 m)   Constitutional: Obviously obese female. No acute distress.  Head: Normocephalic and atraumatic.  Eyes: Conjunctivae are normal, EOM nl Cardiovascular: RRR, nl S1S2, no murmur, trace bilateral pitting edema on ankles, no JVD Respiratory: Effort normal and breath sounds normal. No respiratory distress. No wheezes.  GI: Soft. Bowel sounds are normal. No distension. There is no tenderness.  Neurological: Is alert and oriented x 3  Skin: Not diaphoretic. No erythema.  Psychiatric: Normal mood and affect. Behavior is normal. Judgment and thought content normal. MSK: Right knee tenderness at medial joint line, mild swelling.  No erythema.  Full range of motion with mild associated pain. Left leg: No unilateral swelling.  No erythema.  Has tenderness at left calf   Assessment & Plan:   See Encounters Tab for problem based charting. PROCEDURE NOTE  PROCEDURE: right knee joint steroid injection.  PREOPERATIVE DIAGNOSIS: Osteoarthritis of the right knee.  POSTOPERATIVE DIAGNOSIS: Osteoarthritis of the right knee.  PROCEDURE: The patient was apprised of the risks and the benefits of the procedure and informed consent was obtained, as witnessed by [facility]. Time-out  procedure was performed, with confirmation of the patient's name, date of birth, and correct identification of the right knee to be injected. The patient's knee was then marked at the appropriate site for injection placement. The knee was sterilely prepped with Betadine. A 49 mg (1 milliliter) solution of Kenalog was drawn up into a 5 mL syringe with a 2 mL of 1% lidocaine. The  patient was injected with a 25-gauge needle at the medial aspect of her right flexed knee. There were no complications. The patient tolerated the procedure well. There was minimal bleeding. The patient was instructed to ice her knee upon leaving clinic and refrain from overuse over the next 3 days. The patient was instructed to go to the emergency room with any usual pain, swelling, or redness occurred in the injected area. The patient was given a followup appointment to evaluate response to the injection to his increased range of motion and reduction of pain. Patient seen with Dr. [name] [place]

## 2019-12-21 NOTE — Assessment & Plan Note (Addendum)
Patient reports intermittent left calf crampy pain for past few weeks. Denies any swelling, redness at the area. On exam, there is no unilateral calf swelling.  No erythema.  But she has tenderness at posterior aspect of her left calf. She has history of DVT and PE and has been on anticoagulation.  She reports compliance to Xarelto. Being on anticoagulation, there is low test probability of DVT so we can proceed with checking D-dimer to evaluate for any clot. We will also check BMP to consider metabolic causes such as hypokalemia as a cause of leg cramps. -D-dimer -BMP  Addendum: D-dimer is normal and BMP is unremarkable with normal sodium and potassium and creatinine.

## 2019-12-21 NOTE — Patient Instructions (Addendum)
Thank you for allowing [place] to provide your care today. Today we discussed your left leg pain. We did some blood work (Such as potassium level and also D Dimer that can help [place] to know if you have any clot). I will call if any are abnormal.    Please follow-up with [place] in 1 to 2 weeks if no improvement. If had worsening of symptoms, severe leg pain, shortness of breath, fever chills or any concerning symptoms, please give [place] a call and go to emergency room.  For your right knee pain, we performed another steroid injection today. You tolerated that well. Please give [place] a call if you developed bleeding or sever pain, fever or sever swelling or redness.  Should you have any questions or concerns please call the internal medicine clinic at [phone].

## 2019-12-21 NOTE — Assessment & Plan Note (Deleted)
Patient reports some crampy pain at left calf.

## 2019-12-21 NOTE — Assessment & Plan Note (Addendum)
Patient presented for follow-up of right knee pain secondary to osteoarthritis.  She reports some improvement after last steroid injection 2 months ago but pain has now come back. She has tenderness on medial joint line and slight swelling around the knee. She asked for another steroid injection. Given she showed improvement with prior steroid injection 2 months ago and current continued symptoms, it is reasonable to repeat steroid injection today. -Perform a steroid injection to right knee today.  Patient tolerated that well.  Please refer to procedure note on clinic note. -Received refill for Norco for pain control by primary care Dr. [name] Dundalk.

## 2019-12-26 NOTE — Progress Notes (Signed)
Internal Medicine Clinic Attending  I saw and evaluated the patient.  I personally confirmed the key portions of the history and exam documented by Dr. Myrtie Hawk and I reviewed pertinent patient test results.  The assessment, diagnosis, and plan were formulated together and I agree with the documentation in the resident's note.   I was present for the entire procedure.

## 2020-01-08 ENCOUNTER — Other Ambulatory Visit: Payer: Self-pay | Admitting: Cardiology

## 2020-01-12 DIAGNOSIS — G4733 Obstructive sleep apnea (adult) (pediatric): Secondary | ICD-10-CM | POA: Diagnosis not present

## 2020-01-17 ENCOUNTER — Other Ambulatory Visit: Payer: Self-pay | Admitting: *Deleted

## 2020-01-17 MED ORDER — OZEMPIC (0.25 OR 0.5 MG/DOSE) 2 MG/1.5ML ~~LOC~~ SOPN: 0.5000 mg | PEN_INJECTOR | SUBCUTANEOUS | 1 refills | Status: DC

## 2020-01-24 ENCOUNTER — Telehealth: Payer: Self-pay | Admitting: *Deleted

## 2020-01-24 MED ORDER — DICLOFENAC SODIUM 1 % EX GEL: CUTANEOUS | 3 refills | Status: DC

## 2020-01-24 NOTE — Telephone Encounter (Signed)
New order placed, it may no longer be covered by insurance as it is now OTC.

## 2020-01-26 ENCOUNTER — Other Ambulatory Visit: Payer: Self-pay

## 2020-01-26 ENCOUNTER — Ambulatory Visit: Payer: Medicare Other | Admitting: Internal Medicine

## 2020-01-26 ENCOUNTER — Encounter: Payer: Self-pay | Admitting: Internal Medicine

## 2020-01-26 VITALS — BP 117/58 | HR 76 | Temp 97.9°F | Ht 67.0 in | Wt 311.7 lb

## 2020-01-26 DIAGNOSIS — Z79891 Long term (current) use of opiate analgesic: Secondary | ICD-10-CM

## 2020-01-26 DIAGNOSIS — Z6841 Body Mass Index (BMI) 40.0 and over, adult: Secondary | ICD-10-CM

## 2020-01-26 DIAGNOSIS — I5022 Chronic systolic (congestive) heart failure: Secondary | ICD-10-CM

## 2020-01-26 DIAGNOSIS — G8929 Other chronic pain: Secondary | ICD-10-CM

## 2020-01-26 NOTE — Progress Notes (Signed)
Knee high TEDS x 2 pairs place in the mail.

## 2020-01-26 NOTE — Patient Instructions (Addendum)
You can try taking the lasix (furosemide) 40mg  twice a day for 3 days.  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  I want you to go ahead and get the [place] COVID-19 vaccine   Walk-in COVID-19 vaccine clinics will be held from July 7- July 30 at the following locations: [facility], [address] [place], [place] [address]: July 9, 12, & 16: Administering [place] from 9:00 AM - 5:00 PM July 14, 21, & 28: Administering [place] from 12:00 PM - 7:00 PM  July 19, 23, & 30: Administering [place], [facility], and [place] from 9:00 AM - 5:00 PM

## 2020-01-27 NOTE — Progress Notes (Signed)
  Subjective:  HPI: Breanna Wilkins is a 59 y.o. female who presents for f/u chronic pain, LE edema  Please see Assessment and Plan below for the status of her chronic medical problems.  Objective:  Physical Exam: Vitals:   01/26/20 0856  BP: (!) 117/58  Pulse: 76  Temp: 97.9 F (36.6 C)  TempSrc: Oral  SpO2: 100%  Weight: (!) 311 lb 11.2 oz (141.4 kg)  Height: 5\' 7"  (1.702 m)   Body mass index is 48.82 kg/m. Physical Exam Vitals and nursing note reviewed.  Constitutional:      Appearance: Normal appearance. She is obese.  Cardiovascular:     Rate and Rhythm: Normal rate and regular rhythm.  Pulmonary:     Effort: Pulmonary effort is normal.     Breath sounds: Normal breath sounds.  Musculoskeletal:     Right lower leg: Edema present.     Left lower leg: Edema present.  Skin:    General: Skin is warm and dry.     Capillary Refill: Capillary refill takes less than 2 seconds.  Neurological:     Mental Status: She is alert.  Psychiatric:        Mood and Affect: Mood normal.        Behavior: Behavior normal.    Assessment & Plan:  See Encounters Tab for problem based charting.  Medications Ordered Meds ordered this encounter  Medications  . HYDROcodone-acetaminophen (NORCO) 10-325 MG tablet    Sig: Take 1 tablet by mouth every 6 (six) hours as needed for severe pain.    Dispense:  100 tablet    Refill:  0    Rx 1/3  . HYDROcodone-acetaminophen (NORCO) 10-325 MG tablet    Sig: Take 1 tablet by mouth every 6 (six) hours as needed for severe pain.    Dispense:  100 tablet    Refill:  0    Rx 2/3  . HYDROcodone-acetaminophen (NORCO) 10-325 MG tablet    Sig: Take 1 tablet by mouth every 6 (six) hours as needed for severe pain.    Dispense:  100 tablet    Refill:  0    Rx3/3   Other Orders No orders of the defined types were placed in this encounter.  Follow Up: Return in about 3 months (around 04/27/2020).

## 2020-01-30 MED ORDER — HYDROCODONE-ACETAMINOPHEN 10-325 MG PO TABS: 1.0000 | ORAL_TABLET | Freq: Four times a day (QID) | ORAL | 0 refills | Status: DC | PRN

## 2020-01-30 NOTE — Assessment & Plan Note (Signed)
She recently has increased Ozempic to the 0.5 mg weekly dose so far she is tolerating this dose well.  She may continue to increase dose to 1 mg weekly as her monitor for weight loss.

## 2020-01-30 NOTE — Assessment & Plan Note (Addendum)
HPI: Symptoms remain NYHA class 2.  She reports adherence to her medications.  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 Continue carvedilol.  Continue lisinopril.

## 2020-01-30 NOTE — Assessment & Plan Note (Signed)
Continues to have pain in her shoulders and knees that limit activity.  She continues to do well with hydrocodone acetaminophen.  Review of the database is appropriate.  She has had no red flag symptoms.  We have intermittently used steroid injections to assist with pain control.  Continue hydrocodone acetaminophen 10-3 25 every 6 as needed number 100/month.  Continue gabapentin, intermittent steroid injections muscular related pains.

## 2020-02-08 ENCOUNTER — Other Ambulatory Visit: Payer: Self-pay | Admitting: *Deleted

## 2020-02-08 MED ORDER — DICLOFENAC SODIUM 1 % EX GEL: CUTANEOUS | 3 refills | Status: DC

## 2020-02-09 ENCOUNTER — Other Ambulatory Visit: Payer: Self-pay | Admitting: *Deleted

## 2020-02-09 NOTE — Patient Outreach (Signed)
[facility]) Care Management  [place]  02/09/2020   Breanna Wilkins 11-25-1960 [id number]  RN Health Coach received telephone call from patient.  Hipaa compliance verified. Patient blood pressure readings are better. Last reading is 117/58. Patient has edema bilateral lower extremities. Patient has started wearing compression hose. Per patient she is taking diuretic. Patient is having pain in shoulder and knees. Patient has not had any recent falls since last outreach. Patient has agreed to further outreach calls.  Encounter Medications:  Outpatient Encounter Medications as of 02/09/2020  Medication Sig  . aspirin 81 MG chewable tablet Chew 1 tablet (81 mg total) by mouth daily.  . carvedilol (COREG) 6.25 MG tablet TAKE 1 TABLET BY MOUTH TWICE DAILY WITH MEALS  . cyclobenzaprine (FEXMID) 7.5 MG tablet Take 1 tablet (7.5 mg total) by mouth 3 (three) times daily as needed for muscle spasms.  . diclofenac Sodium (VOLTAREN) 1 % GEL Apply 4g to knee four times a day as needed for pain, apply 2g to shoulder as needed for pain  . furosemide (LASIX) 40 MG tablet Take 1 tablet (40 mg total) by mouth daily.  Marland Kitchen gabapentin (NEURONTIN) 300 MG capsule Take 3 capsules (900 mg total) by mouth 3 (three) times daily.  Derrill Memo ON 02/19/2020] HYDROcodone-acetaminophen (NORCO) 10-325 MG tablet Take 1 tablet by mouth every 6 (six) hours as needed for severe pain.  Derrill Memo ON 03/20/2020] HYDROcodone-acetaminophen (NORCO) 10-325 MG tablet Take 1 tablet by mouth every 6 (six) hours as needed for severe pain.  Derrill Memo ON 04/19/2020] HYDROcodone-acetaminophen (NORCO) 10-325 MG tablet Take 1 tablet by mouth every 6 (six) hours as needed for severe pain.  . Insulin Pen Needle (NOVOFINE) 32G X 6 MM MISC 1 Units by Does not apply route daily.  Marland Kitchen lidocaine (XYLOCAINE) 5 % ointment Apply 1 application topically 2 (two) times daily as needed.  Marland Kitchen lisinopril (ZESTRIL) 40 MG tablet Take 1 tablet (40  mg total) by mouth daily.  . pantoprazole (PROTONIX) 40 MG tablet TAKE 1 TABLET BY MOUTH 30 MINUTES BEFORE LUNCH OR DINNER  . polyethylene glycol (MIRALAX / GLYCOLAX) packet Take 17 g by mouth daily.  . rivaroxaban (XARELTO) 20 MG TABS tablet Take 1 tablet (20 mg total) by mouth daily with supper.  . rosuvastatin (CRESTOR) 5 MG tablet Take 1 tablet (5 mg total) by mouth at bedtime.  . Semaglutide,0.25 or 0.5MG/DOS, (OZEMPIC, 0.25 OR 0.5 MG/DOSE,) 2 MG/1.5ML SOPN Inject 0.375 mLs (0.5 mg total) into the skin once a week.   No facility-administered encounter medications on file as of 02/09/2020.    Functional Status:  In your present state of health, do you have any difficulty performing the following activities: 01/26/2020 10/20/2019  Hearing? N N  Vision? N N  Difficulty concentrating or making decisions? N N  Walking or climbing stairs? Y Y  Comment - -  Dressing or bathing? N N  Doing errands, shopping? N N  Preparing Food and eating ? - -  Using the Toilet? - -  In the past six months, have you accidently leaked urine? - -  Do you have problems with loss of bowel control? - -  Managing your Medications? - -  Managing your Finances? - -  Housekeeping or managing your Housekeeping? - -  Some recent data might be hidden    Fall/Depression Screening: Fall Risk  02/09/2020 01/26/2020 12/21/2019  Falls in the past year? 1 1 0  Number falls in past yr:  0 0 -  Injury with Fall? 1 1 -  Risk for fall due to : Other (Comment);History of fall(s) Other (Comment) Impaired mobility  Risk for fall due to: Comment - Felled off porch. -  Follow up Falls prevention discussed Falls prevention discussed Falls prevention discussed   PHQ 2/9 Scores 01/26/2020 12/21/2019 10/20/2019 09/14/2019 09/06/2019 06/23/2019 04/26/2019  PHQ - 2 Score 0 0 0 0 0 0 0  PHQ- 9 Score - 0 0 - - - -   Goals Addressed            This Visit's Progress   . Blood Pressure < 140/90       CARE PLAN ENTRY (see longtitudinal plan of  care for additional care plan information)  Objective:  . Last practice recorded BP readings:  BP Readings from Last 3 Encounters:  01/26/20 (!) 117/58  12/21/19 129/69  10/20/19 (!) 168/92 .   Marland Kitchen Most recent eGFR/CrCl: No results found for: EGFR  No components found for: CRCL  Current Barriers:  Marland Kitchen Knowledge Deficits related to understanding of medications prescribed for management of hypertension . Knowledge deficit related to self care management of hypertension . Knowledge deficit related to dangers of uncontrolled hypertension . Difficulty obtaining medications . Behavior modification  Case Manager Clinical Goal(s):  Marland Kitchen Over the next 90 days, patient will verbalize understanding of plan for hypertension management . Over the next 90 days, patient will attend all scheduled medical appointments: PCP and cardiologist . Over the next 90 days, patient will demonstrate improved adherence to prescribed treatment plan for hypertension as evidenced by taking all medications as prescribed, monitoring and recording blood pressure as directed, adhering to low sodium/DASH diet . Over the next 90 days, patient will demonstrate improved health management independence as evidenced by checking blood pressure as directed and notifying PCP if SBP>180 or DBP > 110, taking all medications as prescribe, and adhering to a low sodium diet as discussed. . Over the next 90 days, patient will verbalize basic understanding of hypertension disease process and self health management plan as evidenced by blood pressure <140/90 and weight loss. . Over the next 90 days, patient will utilize portion control . Over the next 90 days patient will try to start an exercise routine  Interventions:  . Evaluation of current treatment plan related to hypertension self management and patient's adherence to plan as established by provider. . Reviewed medications with patient and discussed importance of compliance . Discussed  plans with patient for ongoing care management follow up and provided patient with direct contact information for care management team . Advised patient, providing education and rationale, to monitor blood pressure daily and record, calling PCP for findings outside established parameters.  . Reviewed scheduled/upcoming provider appointments including:  . Provided education regarding s/s of stroke and stroke prevention . Provided education regarding s/s of heart attack . Provided education regarding s/s of DASH diet/Low salt diet . Provided education regarding complications of uncontrolled blood pressure . Provided education regarding increasing physical activity . Provided educational material: Exercise Program Activity Book . Provided educational material:  EMMI (Weight Loss Tips, Weight Loss Diet, Chair Exercises, Relaxation Techniques, Low Sodium Diet, DASH Diet, Controlling your Blood Pressure through Lifestyle, High Blood Pressure Health Problems, High Blood Pressure Taking Your Blood Pressure, Lowering Your Rick of High Blood Pressure, Stress, etc)  Patient Self Care Activities:  . Self administers medications as prescribed . Attends all scheduled provider appointments . Calls provider office for new concerns, questions,  or BP outside discussed parameters . Monitors BP and records as discussed . Adheres to a low sodium diet/DASH diet . Increase physical activity as tolerated . Verbalize where to go to receive medical care . Get a medical alert system RN will follow up outreach within the month of October        Assessment:  Patient weight has increased Patient is wearing compression hose Lower extremity edema Patient is not monitoring blood pressure and weight daily  Plan:  Patient will monitor portions of food Patient will start an exercise routine RN will call for medical alert system Patient will monitor bp and weight daily Patient will keep appointment and follow up  visits  [facility] [phone]

## 2020-02-09 NOTE — Patient Outreach (Signed)
[facility]) Care Management  02/09/2020  Breanna Wilkins 24-May-1961 [id number]   RN Health Coach attempted follow up outreach call to patient.  Patient was unavailable. Per voicemail message stated call back please.  Plan: RN will call patient again within 30 days.  [facility] [phone]

## 2020-02-13 ENCOUNTER — Other Ambulatory Visit: Payer: Self-pay | Admitting: *Deleted

## 2020-02-13 NOTE — Patient Outreach (Signed)
[name]) Care Management  02/13/2020  Breanna Wilkins 28-Jan-1961 [id number]   RN Health Coach telephone call to patient.  Hipaa compliance verified. Patient had not received a rollator walker or call from [facility]. RN called medical supply after seeing the note that they would not receive equipment until October and was unable to reach patient.  [employer] had incorrect patient phone number, the cell phone was husband and no longer valid. RN discussed patient needing this equipment. Per [employer] the rollator walkers just came in today and they would call patient this evening and let her know it was there.   [name] [phone]

## 2020-02-16 ENCOUNTER — Other Ambulatory Visit: Payer: Self-pay | Admitting: Internal Medicine

## 2020-02-16 DIAGNOSIS — I5022 Chronic systolic (congestive) heart failure: Secondary | ICD-10-CM

## 2020-02-16 DIAGNOSIS — Z86718 Personal history of other venous thrombosis and embolism: Secondary | ICD-10-CM

## 2020-02-16 NOTE — Progress Notes (Signed)
Still having some swelling at the ankles with TED hose, but feels compression overall is appropriate does not think she can tolerate increased pressure.  Therefore will mail a DME Rx for 15-30mm Hg knee high compression stockings.

## 2020-02-21 ENCOUNTER — Other Ambulatory Visit: Payer: Self-pay | Admitting: *Deleted

## 2020-02-21 DIAGNOSIS — I509 Heart failure, unspecified: Secondary | ICD-10-CM

## 2020-02-21 MED ORDER — FUROSEMIDE 40 MG PO TABS: 40.0000 mg | ORAL_TABLET | Freq: Every day | ORAL | 3 refills | Status: DC

## 2020-03-01 ENCOUNTER — Encounter: Payer: Medicare Other | Admitting: Internal Medicine

## 2020-03-28 ENCOUNTER — Other Ambulatory Visit: Payer: Self-pay | Admitting: *Deleted

## 2020-03-28 DIAGNOSIS — M5432 Sciatica, left side: Secondary | ICD-10-CM

## 2020-03-28 MED ORDER — GABAPENTIN 300 MG PO CAPS: 900.0000 mg | ORAL_CAPSULE | Freq: Three times a day (TID) | ORAL | 5 refills | Status: DC

## 2020-04-05 ENCOUNTER — Other Ambulatory Visit: Payer: Self-pay | Admitting: *Deleted

## 2020-04-05 MED ORDER — PANTOPRAZOLE SODIUM 40 MG PO TBEC
DELAYED_RELEASE_TABLET | ORAL | 3 refills | Status: DC
Start: 2020-04-05 — End: 2021-02-13

## 2020-04-11 ENCOUNTER — Other Ambulatory Visit: Payer: Self-pay

## 2020-04-11 MED ORDER — CYCLOBENZAPRINE HCL 7.5 MG PO TABS
7.5000 mg | ORAL_TABLET | Freq: Three times a day (TID) | ORAL | 1 refills | Status: DC | PRN
Start: 2020-04-11 — End: 2021-02-13

## 2020-04-12 DIAGNOSIS — G4733 Obstructive sleep apnea (adult) (pediatric): Secondary | ICD-10-CM | POA: Diagnosis not present

## 2020-05-11 ENCOUNTER — Other Ambulatory Visit: Payer: Self-pay | Admitting: *Deleted

## 2020-05-11 NOTE — Patient Instructions (Signed)
Goals Addressed            This Visit's Progress   . (THN)Eat Healthy       Follow Up Date [id number]   - change to whole grain breads, cereal, pasta - set goal weight - drink 6 to 8 glasses of water each day - fill half of plate with vegetables - limit fast food meals to no more than 1 per week - manage portion size - set a realistic goal    Why is this important?   When you are ready to manage your nutrition or weight, having a plan and setting goals will help.  Taking small steps to change how you eat and exercise is a good place to start.    Notes:     . (THN)Learn More About My Health       Follow Up Date [id number]   - make a list of questions - ask questions - bring a list of my medicines to the visit - speak up when I don't understand    Why is this important?   The best way to learn about your health and care is by talking to the doctor and nurse.  They will answer your questions and give you information in the way that you like best.    Notes:     . (THN)Lifestyle Change       Follow Up Date [id number]   - ask questions to understand - learn about high blood pressure    Why is this important?   The changes that you are asked to make may be hard to do.  This is especially true when the changes are life-long.  Knowing why it is important to you is the first step.  Working on the change with your family or support person helps you not feel alone.  Reward yourself and family or support person when goals are met. This can be an activity you choose like bowling, hiking, biking, swimming or shooting hoops.     Notes:     . [facility] and Keep All Appointments       Follow Up Date [id number]   - call to cancel if needed - keep a calendar with prescription refill dates - keep a calendar with appointment dates    Why is this important?   Part of staying healthy is seeing the doctor for follow-up care.  If you forget your appointments, there are some things you can  do to stay on track.    Notes:     . (THN)Manage My Cholesterol       Follow Up Date [id number]   - eat smaller or less servings of red meat - read food labels for fat and fiber    Why is this important?   Changing cholesterol starts with eating heart-healthy foods.  Other steps may be to increase your activity and to quit if you smoke.    Notes:     . (THN)Stop or Cut Down Having Caffeine       Follow Up Date [id number]   - reduce caffeine intake a little at a time    Why is this important?   If you want to decrease the amount of caffeine there are steps you can take to do it.  First, learn to read labels to see if a food or drink contains caffeine. Then, take steps to cut back or stop.    Notes:     . (THN)Track and Manage  My Blood Pressure       Follow Up Date [id number]   - check blood pressure daily - check blood pressure weekly - choose a place to take my blood pressure (home, clinic or office, retail store) - write blood pressure results in a log or diary    Why is this important?   You won't feel high blood pressure, but it can still hurt your blood vessels.  High blood pressure can cause heart or kidney problems. It can also cause a stroke.  Making lifestyle changes like losing a little weight or eating less salt will help.  Checking your blood pressure at home and at different times of the day can help to control blood pressure.  If the doctor prescribes medicine remember to take it the way the doctor ordered.  Call the office if you cannot afford the medicine or if there are questions about it.     Notes:

## 2020-05-11 NOTE — Patient Outreach (Signed)
[facility]) Care Management  [place]  05/11/2020   Breanna Wilkins 07-25-1960 [id number]  RN Health Coach telephone call to patient.  [employer] compliance verified. Patient stated her blood pressure is better. Patient has been taking her medications as per ordered. Per patient she has seen some decrease in her weight.  Patient stated that she has went from a size 52 to 49 in waist. Patient does not want to weigh until November. Patient has received medical alert system. Patient got an exercise bike that she has put together and will start a regular exercise routine. Patient uses a CPAP machine but has not been using regularly. RN trouble shoot with patient about issues. Patient will follow up if additional problems. Patient has agreed to follow up outreach calls.  Encounter Medications:  Outpatient Encounter Medications as of 05/11/2020  Medication Sig   aspirin 81 MG chewable tablet Chew 1 tablet (81 mg total) by mouth daily.   carvedilol (COREG) 6.25 MG tablet TAKE 1 TABLET BY MOUTH TWICE DAILY WITH MEALS   cyclobenzaprine (FEXMID) 7.5 MG tablet Take 1 tablet (7.5 mg total) by mouth 3 (three) times daily as needed for muscle spasms.   diclofenac Sodium (VOLTAREN) 1 % GEL Apply 4g to knee four times a day as needed for pain, apply 2g to shoulder as needed for pain   furosemide (LASIX) 40 MG tablet Take 1 tablet (40 mg total) by mouth daily.   gabapentin (NEURONTIN) 300 MG capsule Take 3 capsules (900 mg total) by mouth 3 (three) times daily.   HYDROcodone-acetaminophen (NORCO) 10-325 MG tablet Take 1 tablet by mouth every 6 (six) hours as needed for severe pain.   HYDROcodone-acetaminophen (NORCO) 10-325 MG tablet Take 1 tablet by mouth every 6 (six) hours as needed for severe pain.   HYDROcodone-acetaminophen (NORCO) 10-325 MG tablet Take 1 tablet by mouth every 6 (six) hours as needed for severe pain.   Insulin Pen Needle (NOVOFINE) 32G X 6 MM MISC 1  Units by Does not apply route daily.   lidocaine (XYLOCAINE) 5 % ointment Apply 1 application topically 2 (two) times daily as needed.   lisinopril (ZESTRIL) 40 MG tablet Take 1 tablet (40 mg total) by mouth daily.   pantoprazole (PROTONIX) 40 MG tablet TAKE 1 TABLET BY MOUTH 30 MINUTES BEFORE LUNCH OR DINNER   polyethylene glycol (MIRALAX / GLYCOLAX) packet Take 17 g by mouth daily.   rivaroxaban (XARELTO) 20 MG TABS tablet Take 1 tablet (20 mg total) by mouth daily with supper.   rosuvastatin (CRESTOR) 5 MG tablet Take 1 tablet (5 mg total) by mouth at bedtime.   Semaglutide,0.25 or 0.5MG/DOS, (OZEMPIC, 0.25 OR 0.5 MG/DOSE,) 2 MG/1.5ML SOPN Inject 0.375 mLs (0.5 mg total) into the skin once a week.   No facility-administered encounter medications on file as of 05/11/2020.    Functional Status:  In your present state of health, do you have any difficulty performing the following activities: 01/26/2020 10/20/2019  Hearing? N N  Vision? N N  Difficulty concentrating or making decisions? N N  Walking or climbing stairs? Y Y  Comment - -  Dressing or bathing? N N  Doing errands, shopping? N N  Preparing Food and eating ? - -  Using the Toilet? - -  In the past six months, have you accidently leaked urine? - -  Do you have problems with loss of bowel control? - -  Managing your Medications? - -  Managing your Finances? - -  Housekeeping or managing your Housekeeping? - -  Some recent data might be hidden    Fall/Depression Screening: Fall Risk  05/11/2020 02/09/2020 01/26/2020  Falls in the past year? _0 Number falls in past yr: 0 0 0  Injury with Fall? _1 Risk for fall due to : History of fall(s);Impaired balance/gait;Impaired mobility Other (Comment);History of fall(s) Other (Comment)  Risk for fall due to: Comment - - Felled off porch.  Follow up Falls evaluation completed Falls prevention discussed Falls prevention discussed   PHQ 2/9 Scores 01/26/2020 12/21/2019 10/20/2019  09/14/2019 09/06/2019 06/23/2019 04/26/2019  PHQ - 2 Score 0 0 0 0 0 0 0  PHQ- 9 Score - 0 0 - - - -    Assessment:  Goals Addressed            This Visit's Progress    (THN)Eat Healthy       Follow Up Date [id number]   - change to whole grain breads, cereal, pasta - set goal weight - drink 6 to 8 glasses of water each day - fill half of plate with vegetables - limit fast food meals to no more than 1 per week - manage portion size - set a realistic goal    Why is this important?   When you are ready to manage your nutrition or weight, having a plan and setting goals will help.  Taking small steps to change how you eat and exercise is a good place to start.    Notes:      (THN)Learn More About My Health       Follow Up Date [id number]   - make a list of questions - ask questions - bring a list of my medicines to the visit - speak up when I don't understand    Why is this important?   The best way to learn about your health and care is by talking to the doctor and nurse.  They will answer your questions and give you information in the way that you like best.    Notes:      (THN)Lifestyle Change       Follow Up Date [id number]   - ask questions to understand - learn about high blood pressure    Why is this important?   The changes that you are asked to make may be hard to do.  This is especially true when the changes are life-long.  Knowing why it is important to you is the first step.  Working on the change with your family or support person helps you not feel alone.  Reward yourself and family or support person when goals are met. This can be an activity you choose like bowling, hiking, biking, swimming or shooting hoops.     Notes:      [facility] and Keep All Appointments       Follow Up Date [id number]   - call to cancel if needed - keep a calendar with prescription refill dates - keep a calendar with appointment dates    Why is this important?   Part of  staying healthy is seeing the doctor for follow-up care.  If you forget your appointments, there are some things you can do to stay on track.    Notes:      (THN)Manage My Cholesterol       Follow Up Date [id number]   - eat smaller or less servings of red meat - read food labels  for fat and fiber    Why is this important?   Changing cholesterol starts with eating heart-healthy foods.  Other steps may be to increase your activity and to quit if you smoke.    Notes:      (THN)Stop or Cut Down Having Caffeine       Follow Up Date [id number]   - reduce caffeine intake a little at a time    Why is this important?   If you want to decrease the amount of caffeine there are steps you can take to do it.  First, learn to read labels to see if a food or drink contains caffeine. Then, take steps to cut back or stop.    Notes:      (THN)Track and Manage My Blood Pressure       Follow Up Date [id number]   - check blood pressure daily - check blood pressure weekly - choose a place to take my blood pressure (home, clinic or office, retail store) - write blood pressure results in a log or diary    Why is this important?   You won't feel high blood pressure, but it can still hurt your blood vessels.  High blood pressure can cause heart or kidney problems. It can also cause a stroke.  Making lifestyle changes like losing a little weight or eating less salt will help.  Checking your blood pressure at home and at different times of the day can help to control blood pressure.  If the doctor prescribes medicine remember to take it the way the doctor ordered.  Call the office if you cannot afford the medicine or if there are questions about it.     Notes:        Plan:  Discusses healthy eating RN discussed CPAP issues Patient will follow up if further issues with CPAP RN encouraged progress with weight loss RN discussed exercise routine with new exercise bike Patient will set up her new  medical alert system Patient will follow up on appointments Discussed getting the COVID vaccine RN will follow up within the month of Decemebr RN sent update assessment to PCP  [place] [facility] [phone]

## 2020-05-17 ENCOUNTER — Telehealth: Payer: Self-pay

## 2020-05-17 MED ORDER — HYDROCODONE-ACETAMINOPHEN 10-325 MG PO TABS
1.0000 | ORAL_TABLET | Freq: Four times a day (QID) | ORAL | 0 refills | Status: DC | PRN
Start: 2020-05-21 — End: 2020-06-21

## 2020-05-17 NOTE — Telephone Encounter (Signed)
HYDROcodone-acetaminophen (NORCO) 10-325 MG tablet, REFILL REQUEST @  [place], [place]. Phone:  [phone]  Fax:  [phone]

## 2020-05-17 NOTE — Telephone Encounter (Signed)
Sent refill in for pickup on 05/21/20.

## 2020-05-17 NOTE — Telephone Encounter (Signed)
3 Rxs for hydrocodone were sent to [facility] on 02/19/2020. Call placed to John at pharmacy. Patient picked up 100 tabs on 04/21/2020 but this was from refill sent on 01/30/2020 by PCP. States they did not receive any Rxs from 02/19/2020. Hubbard Hartshorn, BSN, RN-BC

## 2020-05-18 ENCOUNTER — Other Ambulatory Visit: Payer: Self-pay | Admitting: Internal Medicine

## 2020-05-18 ENCOUNTER — Encounter: Payer: Self-pay | Admitting: *Deleted

## 2020-05-18 DIAGNOSIS — Z1231 Encounter for screening mammogram for malignant neoplasm of breast: Secondary | ICD-10-CM

## 2020-05-21 NOTE — Progress Notes (Signed)
   CC: ear/neck pain  HPI:  [name] is a 59 y.o. female with history as below presenting for ear/neck pain. Please refer to problem based charting for further details of assessment and plan of current problem and chronic medical conditions.   Past Medical History:  Diagnosis Date  . Abdominal hernia   . Arthritis   . CAD (coronary artery disease)    Perioperative non-STEMI March, 2014  //   cardiac catheterization at time of non-STEMI, March, 2014, minor nonobstructive coronary disease, vigorous LV function, possibly vasospastic event versus stress cardiomyopathy. No further workup of coronary disease  . CHF (congestive heart failure) ([place])   . Diverticulosis of colon 10/17/2017   Noted on colonoscopy 10/14/17  . Ejection fraction    65-70%, vigorous function, echo, March, 2011  . Ejection fraction    Vigorous function at time of catheterization October 06, 2012,  . History of colonic polyps 10/17/2017   Colonoscopy 10/14/17  . Hyperlipidemia   . Hypertension   . Internal hemorrhoids without complication 09/09/2540   Noted on colonoscopy 10/14/17  . Lung nodule 12/2009   Very small, left upper lobe by CT June, 2011, one-year followup was stable  . Myocardial infarction ([place]) 09/2013  . OSA (obstructive sleep apnea)    CPAP  . Overweight(278.02)   . Pulmonary embolus ([place]) 01/16/10   on Coumadin indefinitely  . Shortness of breath   . Shoulder pain   . Sleep apnea   . Warfarin anticoagulation    Review of Systems:   Review of Systems  Constitutional: Negative for chills and fever.  HENT: Positive for ear pain. Negative for congestion, ear discharge, hearing loss and tinnitus.   Respiratory: Negative for cough and shortness of breath.   Cardiovascular: Positive for chest pain. Negative for palpitations.  Gastrointestinal: Negative for abdominal pain, diarrhea, nausea and vomiting.  Musculoskeletal: Positive for joint pain, myalgias and neck pain.  Skin: Negative for  rash.  Neurological: Negative for dizziness and loss of consciousness.  All other systems reviewed and are negative.    Physical Exam: Vitals:   05/22/20 1039  BP: (!) 145/76  Pulse: 67  Temp: (!) 97.4 F (36.3 C)  TempSrc: Oral  SpO2: 100%  Weight: (!) 310 lb 1.6 oz (140.7 kg)  Height: 5\' 7"  (1.702 m)   Constitutional: no acute distress Head: atraumatic ENT: external ears normal, some cerumen bilaterally and ear drums are normal, no lymphadenoapthy Cardiovascular: regular rate and rhythm, normal heart sounds Pulmonary: effort normal, normal breath sounds bilaterally Abdominal: flat, nontender, no rebound tenderness, bowel sounds normal Musculoskeletal: tenderness to R SCM muscle belly Skin: warm and dry Neurological: alert, no focal deficit Psychiatric: normal mood and affect  Assessment & Plan:   See Encounters Tab for problem based charting.  Patient seen with Dr. [name] [place]

## 2020-05-22 ENCOUNTER — Other Ambulatory Visit: Payer: Self-pay

## 2020-05-22 ENCOUNTER — Encounter: Payer: Self-pay | Admitting: Student

## 2020-05-22 ENCOUNTER — Ambulatory Visit (INDEPENDENT_AMBULATORY_CARE_PROVIDER_SITE_OTHER): Payer: Medicare Other | Admitting: Student

## 2020-05-22 DIAGNOSIS — R0789 Other chest pain: Secondary | ICD-10-CM | POA: Diagnosis not present

## 2020-05-22 DIAGNOSIS — M542 Cervicalgia: Secondary | ICD-10-CM

## 2020-05-22 NOTE — Patient Instructions (Signed)
Thank you for allowing [place] to be a part of your care today, it was a pleasure seeing you. We discussed your ear/neck pain and chest pain.  Your exam was reassuring, and it seems that your pain is musculoskeletal, due to your positioning and straining to see over the seat. Try and change your posture at work, and we can write a work note if if would be helpful.  For your chest pain, it does not have concerning features but with your history, I recommend that you call your cardiologist and move up your appointment.   Please follow up at your next scheduled appointment.   Thank you, and please call the Internal Medicine Clinic at [phone] if you have any questions.  Best, Dr. Bridgett Larsson

## 2020-05-22 NOTE — Assessment & Plan Note (Addendum)
She notes pain from her bilateral ears and going down to her neck on both sides. 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 States her pain resolved over the weekend and recurs during the week. Physical exam with tenderness at the R SCM muscle belly. Tried her home cyclobenzaprine which helped yesterday.  Denies fever, hearing change, tinnitus, dizziness, confusion. Normal inner ear exam with some cerumen. No lymphadenopathy.  -change positioning at work -may use already prescribed cyclobenzaprine sparingly

## 2020-05-22 NOTE — Assessment & Plan Note (Signed)
Complaining of chest pressure intermittently which started about 1 week ago, worse when laying down at night. Last episode was 3 days ago. No exertional. Not associated with  Denies SOB, diaphoresis, nausea. Sees her cardiologist annually, last was in January.  -patient will call her cardiologist and move up her appointment

## 2020-05-24 ENCOUNTER — Other Ambulatory Visit: Payer: Self-pay | Admitting: *Deleted

## 2020-05-24 DIAGNOSIS — I2609 Other pulmonary embolism with acute cor pulmonale: Secondary | ICD-10-CM

## 2020-05-24 DIAGNOSIS — I82533 Chronic embolism and thrombosis of popliteal vein, bilateral: Secondary | ICD-10-CM

## 2020-05-24 DIAGNOSIS — Z7901 Long term (current) use of anticoagulants: Secondary | ICD-10-CM

## 2020-05-24 NOTE — Telephone Encounter (Signed)
Next appt scheduled 12/16 with PCP.

## 2020-05-25 MED ORDER — RIVAROXABAN 20 MG PO TABS: 20.0000 mg | ORAL_TABLET | Freq: Every day | ORAL | 3 refills | Status: DC

## 2020-05-25 NOTE — Telephone Encounter (Signed)
Patient called in for refill on Xarelto. States she only has 2 tabs left. Also, requesting Rx be sent with refills so she doesn't have to worry about running out. Hubbard Hartshorn, BSN, RN-BC

## 2020-05-25 NOTE — Progress Notes (Signed)
Internal Medicine Clinic Attending  I saw and evaluated the patient.  I personally confirmed the key portions of the history and exam documented by Dr. Chen and I reviewed pertinent patient test results.  The assessment, diagnosis, and plan were formulated together and I agree with the documentation in the resident's note.  

## 2020-06-04 ENCOUNTER — Other Ambulatory Visit: Payer: Self-pay | Admitting: *Deleted

## 2020-06-04 MED ORDER — ROSUVASTATIN CALCIUM 5 MG PO TABS
5.0000 mg | ORAL_TABLET | Freq: Every day | ORAL | 3 refills | Status: DC
Start: 2020-06-04 — End: 2021-05-23

## 2020-06-04 NOTE — Telephone Encounter (Signed)
LeeLee with [facility] called requesting refill on Rosuvastatin 5 mg to [facility] in [place]. Hubbard Hartshorn, BSN, RN-BC

## 2020-06-21 ENCOUNTER — Other Ambulatory Visit: Payer: Self-pay

## 2020-06-21 NOTE — Telephone Encounter (Signed)
HYDROcodone-acetaminophen (NORCO) 10-325 MG tablet, REFILL REQUEST @  [place], [place]. Phone:  [phone]  Fax:  [phone]

## 2020-06-22 MED ORDER — HYDROCODONE-ACETAMINOPHEN 10-325 MG PO TABS
1.0000 | ORAL_TABLET | Freq: Four times a day (QID) | ORAL | 0 refills | Status: DC | PRN
Start: 2020-06-22 — End: 2020-07-05

## 2020-06-26 ENCOUNTER — Other Ambulatory Visit: Payer: Self-pay | Admitting: *Deleted

## 2020-06-28 ENCOUNTER — Ambulatory Visit
Admission: RE | Admit: 2020-06-28 | Discharge: 2020-06-28 | Disposition: A | Discharge status: Home / Self Care | Payer: Medicare Other | Source: Ambulatory Visit | Attending: Internal Medicine | Admitting: Internal Medicine

## 2020-06-28 ENCOUNTER — Other Ambulatory Visit: Payer: Self-pay

## 2020-06-28 DIAGNOSIS — Z1231 Encounter for screening mammogram for malignant neoplasm of breast: Secondary | ICD-10-CM | POA: Diagnosis not present

## 2020-07-05 ENCOUNTER — Other Ambulatory Visit: Payer: Self-pay | Admitting: Cardiology

## 2020-07-05 ENCOUNTER — Encounter: Payer: Self-pay | Admitting: Internal Medicine

## 2020-07-05 ENCOUNTER — Other Ambulatory Visit: Payer: Self-pay

## 2020-07-05 ENCOUNTER — Ambulatory Visit (INDEPENDENT_AMBULATORY_CARE_PROVIDER_SITE_OTHER): Payer: Medicare Other | Admitting: Internal Medicine

## 2020-07-05 VITALS — BP 166/92 | HR 79 | Temp 97.7°F | Ht 67.0 in | Wt 303.9 lb

## 2020-07-05 DIAGNOSIS — I10 Essential (primary) hypertension: Secondary | ICD-10-CM | POA: Diagnosis not present

## 2020-07-05 DIAGNOSIS — Z79891 Long term (current) use of opiate analgesic: Secondary | ICD-10-CM | POA: Diagnosis not present

## 2020-07-05 DIAGNOSIS — M1711 Unilateral primary osteoarthritis, right knee: Secondary | ICD-10-CM

## 2020-07-05 DIAGNOSIS — Z6841 Body Mass Index (BMI) 40.0 and over, adult: Secondary | ICD-10-CM

## 2020-07-05 MED ORDER — HYDROCODONE-ACETAMINOPHEN 10-325 MG PO TABS: 1.0000 | ORAL_TABLET | Freq: Four times a day (QID) | ORAL | 0 refills | Status: DC | PRN

## 2020-07-05 MED ORDER — OLMESARTAN MEDOXOMIL 40 MG PO TABS: 40.0000 mg | ORAL_TABLET | Freq: Every day | ORAL | 3 refills | Status: DC

## 2020-07-05 MED ORDER — SEMAGLUTIDE (1 MG/DOSE) 4 MG/3ML ~~LOC~~ SOPN: 1.0000 mg | PEN_INJECTOR | SUBCUTANEOUS | 1 refills | Status: DC

## 2020-07-09 NOTE — Progress Notes (Signed)
Subjective:  HPI: [name] is a 59 y.o. female who presents for f/u right knee pain, HTN  Please see Assessment and Plan below for the status of her chronic medical problems.  Objective:  Physical Exam: Vitals:   07/05/20 1005 07/05/20 1008  BP: (!) 148/87 (!) 166/92  Pulse: 78 79  Temp: 97.7 F (36.5 C)   TempSrc: Oral   SpO2: 100%   Weight: (!) 303 lb 14.4 oz (137.8 kg)   Height: 5\' 7"  (1.702 m)    Body mass index is 47.6 kg/m. Physical Exam Nursing note reviewed.  Constitutional:      Appearance: Normal appearance. She is obese. She is not ill-appearing.  Cardiovascular:     Rate and Rhythm: Normal rate and regular rhythm.  Pulmonary:     Effort: Pulmonary effort is normal.     Breath sounds: Normal breath sounds.  Musculoskeletal:        General: Tenderness (medial joint line right knee) present. No swelling (no effusion right knee).     Right lower leg: No edema.     Left lower leg: No edema.  Neurological:     Mental Status: She is alert.    Assessment & Plan:  See Encounters Tab for problem based charting.  Medications Ordered Meds ordered this encounter  Medications   Semaglutide, 1 MG/DOSE, 4 MG/3ML SOPN    Sig: Inject 1 mg into the skin once a week.    Dispense:  9 mL    Refill:  1   olmesartan (BENICAR) 40 MG tablet    Sig: Take 1 tablet (40 mg total) by mouth daily.    Dispense:  90 tablet    Refill:  3    Discontinue Lisinopril   HYDROcodone-acetaminophen (NORCO) 10-325 MG tablet    Sig: Take 1 tablet by mouth every 6 (six) hours as needed for severe pain.    Dispense:  100 tablet    Refill:  0    Rx 1/3   HYDROcodone-acetaminophen (NORCO) 10-325 MG tablet    Sig: Take 1 tablet by mouth every 6 (six) hours as needed for severe pain.    Dispense:  100 tablet    Refill:  0    Rx 2/3   HYDROcodone-acetaminophen (NORCO) 10-325 MG tablet    Sig: Take 1 tablet by mouth every 6 (six) hours as needed for severe pain.    Dispense:   100 tablet    Refill:  0    Rx 3/3   Other Orders Orders Placed This Encounter  Procedures   ToxAssure Select,+Antidepr,UR   Follow Up: Return in about 3 months (around 10/03/2020).  PROCEDURE NOTE  PROCEDURE: right knee joint steroid injection.  PREOPERATIVE DIAGNOSIS: Osteoarthritis of the right knee.  POSTOPERATIVE DIAGNOSIS: Osteoarthritis of the right knee.  PROCEDURE: The patient was apprised of the risks and the benefits of the procedure and informed consent was obtained. Time-out procedure was performed, with confirmation of the patient's name, date of birth, and correct identification of the right knee to be injected. The patient's knee was then marked at the appropriate site for injection placement. The knee was sterilely prepped with Betadine. A 40 mg (1 milliliter) solution of Kenalog was drawn up into a 5 mL syringe with a 2 mL of 1% lidocaine. The patient was injected with a 25-gauge needle at the anterior medial aspect of her right flexed knee. There were no complications. The patient tolerated the procedure well. There was minimal bleeding. The patient  was instructed to ice her knee upon leaving clinic and refrain from overuse over the next 3 days. The patient was instructed to go to the emergency room with any usual pain, swelling, or redness occurred in the injected area. The patient was given a followup appointment to evaluate response to the injection to his increased range of motion and reduction of pain.

## 2020-07-09 NOTE — Assessment & Plan Note (Signed)
No red flags, check urine tox screen

## 2020-07-09 NOTE — Assessment & Plan Note (Signed)
HPI: She has been taking some appetite for weight loss not yet had significant weight loss but was on very low-dose.  Assessment morbid obesity  Plan Increase semaglitde to 1 mg/week

## 2020-07-09 NOTE — Assessment & Plan Note (Signed)
HPI: Asymptomatic, taking lisinopril as directed.  Assessment essential hypertension uncontrolled  Plan Change lisinopril to olmesartan 40 mg daily Continue carvedilol 6.25 twice daily Continue Lasix as needed.

## 2020-07-09 NOTE — Assessment & Plan Note (Addendum)
HPI: Right knee has been bothering her more lately she is requesting a repeat steroid injection.  Hydrocodone continues to help with right knee pain as well as back and shoulder pain however she notes that she is having some increased difficulty walking for prolonged periods with the right knee.  Assessment primary osteoarthritis of the right knee  Plan Right knee steroid injection performed today Continue hydrocodone, for her chronic pain of knee, shoulder and back.

## 2020-07-13 DIAGNOSIS — G4733 Obstructive sleep apnea (adult) (pediatric): Secondary | ICD-10-CM | POA: Diagnosis not present

## 2020-07-13 LAB — TOXASSURE SELECT,+ANTIDEPR,UR

## 2020-07-19 ENCOUNTER — Other Ambulatory Visit: Payer: Self-pay | Admitting: *Deleted

## 2020-07-19 NOTE — Patient Outreach (Signed)
[facility]) Care Management  [place]  07/19/2020   Breanna Wilkins 01-07-1961 [id number]  RN Health Coach telephone call to patient.  Hipaa compliance verified. Per patient she has not been checking her blood pressure at home. She is aware that it was a little high in the Dr office. Patient stated that she would start checking at home. Patient has not received the COVID or flu shot. Patient will discussed with PCP about receiving the vaccine. Patient has followed up with eye exam. Patient has agreed to follow up outreach calls.    Encounter Medications:  Outpatient Encounter Medications as of 07/19/2020  Medication Sig  . aspirin 81 MG chewable tablet Chew 1 tablet (81 mg total) by mouth daily.  . carvedilol (COREG) 6.25 MG tablet TAKE 1 TABLET BY MOUTH TWICE DAILY WITH MEALS  . cyclobenzaprine (FEXMID) 7.5 MG tablet Take 1 tablet (7.5 mg total) by mouth 3 (three) times daily as needed for muscle spasms.  . diclofenac Sodium (VOLTAREN) 1 % GEL Apply 4g to knee four times a day as needed for pain, apply 2g to shoulder as needed for pain  . furosemide (LASIX) 40 MG tablet Take 1 tablet (40 mg total) by mouth daily.  Derrill Memo ON 07/22/2020] HYDROcodone-acetaminophen (NORCO) 10-325 MG tablet Take 1 tablet by mouth every 6 (six) hours as needed for severe pain.  Derrill Memo ON 08/21/2020] HYDROcodone-acetaminophen (NORCO) 10-325 MG tablet Take 1 tablet by mouth every 6 (six) hours as needed for severe pain.  Derrill Memo ON 09/20/2020] HYDROcodone-acetaminophen (NORCO) 10-325 MG tablet Take 1 tablet by mouth every 6 (six) hours as needed for severe pain.  . Insulin Pen Needle (NOVOFINE) 32G X 6 MM MISC 1 Units by Does not apply route daily.  Marland Kitchen lidocaine (XYLOCAINE) 5 % ointment Apply 1 application topically 2 (two) times daily as needed.  Marland Kitchen olmesartan (BENICAR) 40 MG tablet Take 1 tablet (40 mg total) by mouth daily.  . pantoprazole (PROTONIX) 40 MG tablet TAKE 1 TABLET BY  MOUTH 30 MINUTES BEFORE LUNCH OR DINNER  . polyethylene glycol (MIRALAX / GLYCOLAX) packet Take 17 g by mouth daily.  . rivaroxaban (XARELTO) 20 MG TABS tablet Take 1 tablet (20 mg total) by mouth daily with supper.  . rosuvastatin (CRESTOR) 5 MG tablet Take 1 tablet (5 mg total) by mouth at bedtime.  . Semaglutide, 1 MG/DOSE, 4 MG/3ML SOPN Inject 1 mg into the skin once a week.   No facility-administered encounter medications on file as of 07/19/2020.    Functional Status:  In your present state of health, do you have any difficulty performing the following activities: 07/05/2020 05/22/2020  Hearing? N N  Vision? Y N  Comment pt stated that she is noticing a little blury in her vision -  Difficulty concentrating or making decisions? N N  Walking or climbing stairs? N N  Comment - -  Dressing or bathing? N N  Doing errands, shopping? N N  Preparing Food and eating ? - -  Using the Toilet? - -  In the past six months, have you accidently leaked urine? - -  Do you have problems with loss of bowel control? - -  Managing your Medications? - -  Managing your Finances? - -  Housekeeping or managing your Housekeeping? - -  Some recent data might be hidden    Fall/Depression Screening: Fall Risk  07/19/2020 07/05/2020 05/22/2020  Falls in the past year? 0 0 1  Number falls  in past yr: 0 - 0  Injury with Fall? 1 - 0  Risk for fall due to : No Fall Risks No Fall Risks Impaired balance/gait;History of fall(s)  Risk for fall due to: Comment - - -  Follow up Falls evaluation completed - -   PHQ 2/9 Scores 07/05/2020 05/22/2020 01/26/2020 12/21/2019 10/20/2019 09/14/2019 09/06/2019  PHQ - 2 Score 0 0 0 0 0 0 0  PHQ- 9 Score 0 - - 0 0 - -    Assessment:  Goals Addressed            This Visit's Progress   . (THN)Eat Healthy   Not on track    Timeframe:  Long-Range Goal Priority:  High Start Date:       [id number]                      Expected End Date:  19379024                    Follow Up  Date [id number]   - change to whole grain breads, cereal, pasta - set goal weight - drink 6 to 8 glasses of water each day - fill half of plate with vegetables - limit fast food meals to no more than 1 per week - manage portion size - set a realistic goal    Why is this important?   When you are ready to manage your nutrition or weight, having a plan and setting goals will help.  Taking small steps to change how you eat and exercise is a good place to start.    Notes:  RN discussed healthy meals and snacks    . (THN)Learn More About My Health   On track    Timeframe:  Long-Range Goal Priority:  Medium Start Date:  [id number]                             Expected End Date:  19622297                     Follow Up Date 98921194   - make a list of questions - ask questions - bring a list of my medicines to the visit - speak up when I don't understand    Why is this important?   The best way to learn about your health and care is by talking to the doctor and nurse.  They will answer your questions and give you information in the way that you like best.    Notes:  Patient is going to ask Dr if ok for her to get the COVID vaccines    . (THN)Lifestyle Change       Timeframe:  Long-Range Goal Priority:  High Start Date:    17408144                         Expected End Date: 81856314                     Follow Up Date 97026378   - ask questions to understand - learn about high blood pressure    Why is this important?   The changes that you are asked to make may be hard to do.  This is especially true when the changes are life-long.  Knowing why it is important to you is the first step.  Working on the change with your family or support person helps you not feel alone.  Reward yourself and family or support person when goals are met. This can be an activity you choose like bowling, hiking, biking, swimming or shooting hoops.     Notes:  Patient understands she has to change her  dietary eating habits    . (THN)Make and Keep All Appointments   On track    Timeframe:  Long-Range Goal Priority:  Medium Start Date:       [id number]                      Expected End Date:    38466599                  Follow Up Date 35701779  - call to cancel if needed - keep a calendar with prescription refill dates - keep a calendar with appointment dates    Why is this important?   Part of staying healthy is seeing the doctor for follow-up care.  If you forget your appointments, there are some things you can do to stay on track.    Notes:     . [facility] My Cholesterol       Timeframe:  Long-Range Goal Priority:  Medium Start Date:     39030092                        Expected End Date:    33007622                  Follow Up Date 63335456  - eat smaller or less servings of red meat - read food labels for fat and fiber    Why is this important?   Changing cholesterol starts with eating heart-healthy foods.  Other steps may be to increase your activity and to quit if you smoke.    Notes:  Patient needs to change her eating habits that will help improve her cholesterol    . (THN)Stop or Cut Down Having Caffeine       Timeframe:  Long-Range Goal Priority:  Medium Start Date:  25638937                           Expected End Date:  34287681                    Follow Up Date 15726203  - reduce caffeine intake a little at a time    Why is this important?   If you want to decrease the amount of caffeine there are steps you can take to do it.  First, learn to read labels to see if a food or drink contains caffeine. Then, take steps to cut back or stop.    Notes: Patient will have to reduce caffeine such as tea a little at a time and drink more water     . (THN)Track and Manage My Blood Pressure   Not on track    ,Timeframe:  Long-Range Goal Priority:  High Start Date:     55974163                        Expected End Date:  84536468                    Follow Up Date  03212248   -  check blood pressure daily - check blood pressure weekly - choose a place to take my blood pressure (home, clinic or office, retail store) - write blood pressure results in a log or diary    Why is this important?   You won't feel high blood pressure, but it can still hurt your blood vessels.  High blood pressure can cause heart or kidney problems. It can also cause a stroke.  Making lifestyle changes like losing a little weight or eating less salt will help.  Checking your blood pressure at home and at different times of the day can help to control blood pressure.  If the doctor prescribes medicine remember to take it the way the doctor ordered.  Call the office if you cannot afford the medicine or if there are questions about it.     Notes:  RN discussed the findings in the Dr office and the importance of taking her blood pressure at home and documenting.       Plan:  Follow-up:  Patient agrees to Care Plan and Follow-up. Patient will talk with Dr about receiving COVID booster Patient will schedule flu shot RN provided calendar book for documentation Patient will monitor blood pressures and document  Patient will eat a healthy snack between meals RN will follow up outreach within the month of March RN sent update assessment to PCP  [place] [facility] [phone]

## 2020-07-19 NOTE — Patient Instructions (Signed)
Goals Addressed            This Visit's Progress   . (THN)Eat Healthy       Timeframe:  Long-Range Goal Priority:  High Start Date:       [id number]                      Expected End Date:  80034917                    Follow Up Date 91505697   - change to whole grain breads, cereal, pasta - set goal weight - drink 6 to 8 glasses of water each day - fill half of plate with vegetables - limit fast food meals to no more than 1 per week - manage portion size - set a realistic goal    Why is this important?   When you are ready to manage your nutrition or weight, having a plan and setting goals will help.  Taking small steps to change how you eat and exercise is a good place to start.    Notes:  RN discussed healthy meals and snacks    . (THN)Learn More About My Health       Timeframe:  Long-Range Goal Priority:  Medium Start Date:  94801655                             Expected End Date:  37482707                     Follow Up Date 86754492   - make a list of questions - ask questions - bring a list of my medicines to the visit - speak up when I don't understand    Why is this important?   The best way to learn about your health and care is by talking to the doctor and nurse.  They will answer your questions and give you information in the way that you like best.    Notes:  Patient is going to ask Dr if ok for her to get the COVID vaccines    . (THN)Lifestyle Change       Timeframe:  Long-Range Goal Priority:  High Start Date:    01007121                         Expected End Date: 97588325                     Follow Up Date 49826415   - ask questions to understand - learn about high blood pressure    Why is this important?   The changes that you are asked to make may be hard to do.  This is especially true when the changes are life-long.  Knowing why it is important to you is the first step.  Working on the change with your family or support person helps you not  feel alone.  Reward yourself and family or support person when goals are met. This can be an activity you choose like bowling, hiking, biking, swimming or shooting hoops.     Notes:  Patient understands she has to change her dietary eating habits    . (THN)Make and Keep All Appointments   On track    Timeframe:  Long-Range Goal Priority:  Medium Start Date:       83094076  Expected End Date:    50932671                  Follow Up Date 24580998  - call to cancel if needed - keep a calendar with prescription refill dates - keep a calendar with appointment dates    Why is this important?   Part of staying healthy is seeing the doctor for follow-up care.  If you forget your appointments, there are some things you can do to stay on track.    Notes:     . [facility] My Cholesterol       Timeframe:  Long-Range Goal Priority:  Medium Start Date:     33825053                        Expected End Date:    97673419                  Follow Up Date 37902409  - eat smaller or less servings of red meat - read food labels for fat and fiber    Why is this important?   Changing cholesterol starts with eating heart-healthy foods.  Other steps may be to increase your activity and to quit if you smoke.    Notes:  Patient needs to change her eating habits that will help improve her cholesterol    . (THN)Stop or Cut Down Having Caffeine       Timeframe:  Long-Range Goal Priority:  Medium Start Date:  73532992                           Expected End Date:  42683419                    Follow Up Date 62229798  - reduce caffeine intake a little at a time    Why is this important?   If you want to decrease the amount of caffeine there are steps you can take to do it.  First, learn to read labels to see if a food or drink contains caffeine. Then, take steps to cut back or stop.    Notes: Patient will have to reduce caffeine such as tea a little at a time and drink more water      . (THN)Track and Manage My Blood Pressure       ,Timeframe:  Long-Range Goal Priority:  High Start Date:     92119417                        Expected End Date:  40814481                    Follow Up Date 85631497   - check blood pressure daily - check blood pressure weekly - choose a place to take my blood pressure (home, clinic or office, retail store) - write blood pressure results in a log or diary    Why is this important?   You won't feel high blood pressure, but it can still hurt your blood vessels.  High blood pressure can cause heart or kidney problems. It can also cause a stroke.  Making lifestyle changes like losing a little weight or eating less salt will help.  Checking your blood pressure at home and at different times of the day can help to control blood pressure.  If the doctor prescribes medicine  remember to take it the way the doctor ordered.  Call the office if you cannot afford the medicine or if there are questions about it.     Notes:  RN discussed the findings in the Dr office and the importance of taking her blood pressure at home and documenting.

## 2020-07-23 ENCOUNTER — Telehealth: Payer: Self-pay | Admitting: Interventional Cardiology

## 2020-07-23 ENCOUNTER — Other Ambulatory Visit: Payer: Self-pay | Admitting: *Deleted

## 2020-07-23 NOTE — Telephone Encounter (Signed)
We are recommending the COVID-19 vaccine to all of our patients. Cardiac medications (including blood thinners) should not deter anyone from being vaccinated and there is no need to hold any of those medications prior to vaccine administration.     Currently, there is a hotline to call (active 07/29/19) to schedule vaccination appointments as no walk-ins will be accepted.   Number: [phone].    If an appointment is not available please go to [email or web address] to sign up for notification when additional vaccine appointments are available.   If you have further questions or concerns about the vaccine process, please visit [email or web address] or contact your primary care physician.   

## 2020-07-23 NOTE — Patient Outreach (Signed)
[facility]) Care Management  07/23/2020  EMINA RIBAUDO [birth year] [id number]  RN Health Coach received  telephone call from patient.  Hipaa compliance verified. Per patient she is needing assistance to schedule COVID vaccine.  Plan: RN scheduled vaccine for Saturday at the [facility]. Patient will receive confirmation e-mail .Marland Kitchen  Gean Maidens BSN RN [facility] Care Management [phone]

## 2020-07-28 ENCOUNTER — Ambulatory Visit: Payer: Medicare Other

## 2020-08-20 ENCOUNTER — Other Ambulatory Visit: Payer: Self-pay | Admitting: *Deleted

## 2020-08-20 NOTE — Patient Outreach (Signed)
[facility]) Care Management  08/20/2020  Breanna Wilkins 10/11/1960 [id number]   [place] eceived telephone call from patient.  Hipaa compliance verified.Patient needed assistance getting signed up for Covid vaccine and to receive COVID government testing kits.   Plan: Patient scheduled for 12:45 COVID vaccine at [place] 08/25/2020  [place] test kits have been ordered  [place] Care Management [phone]

## 2020-08-22 ENCOUNTER — Encounter: Payer: Self-pay | Admitting: *Deleted

## 2020-08-22 NOTE — Progress Notes (Signed)

## 2020-08-27 ENCOUNTER — Other Ambulatory Visit: Payer: Self-pay | Admitting: *Deleted

## 2020-08-27 MED ORDER — DICLOFENAC SODIUM 1 % EX GEL: CUTANEOUS | 3 refills | Status: DC

## 2020-08-29 NOTE — Progress Notes (Signed)
Things That May Be Affecting Your Health:  Alcohol  Hearing loss  Pain    Depression  Home Safety  Sexual Health   Diabetes x Lack of physical activity  Stress   Difficulty with daily activities  Loneliness  Tiredness   Drug use  Medicines  Tobacco use   Falls  Motor Vehicle Safety x Weight   Food choices  Oral Health  Other    YOUR PERSONALIZED HEALTH PLAN : 1. Schedule your next subsequent Medicare Wellness visit in one year 2. Attend all of your regular appointments to address your medical issues 3. Complete the preventative screenings and services   Annual Wellness Visit   Medicare Covered Preventative Screenings and [place] Men and Women Who How Often Need? Date of Last Service Action  Abdominal Aortic Aneurysm Adults with AAA risk factors Once y  May offer if has smoked total of >100 cigarettes in lifetime  Alcohol Misuse and Counseling All Adults Screening once a year if no alcohol misuse. Counseling up to 4 face to face sessions. y  Laverle Hobby to drink beer, historically no problematic drinking  Bone Density Measurement  Adults at risk for osteoporosis Once every 2 yrs n    Lipid Panel Z13.6 All adults without CV disease Once every 5 yrs n    Colorectal Cancer   Stool sample or  Colonoscopy All adults 31 and older   Once every year  Every 10 years y  Due for repeat in march  Depression All Adults Once a year  Today   Diabetes Screening Blood glucose, post glucose load, or GTT Z13.1  All adults at risk  Pre-diabetics  Once per year  Twice per year n    Diabetes  Self-Management Training All adults Diabetics 10 hrs first year; 2 hours subsequent years. Requires Copay n    Glaucoma  Diabetics  Family history of glaucoma  African Americans 57 yrs +  Hispanic Americans 67 yrs + Annually - requires coppay y    Hepatitis C Z72.89 or F19.20  High Risk for HCV  Born between 1945 and 1965  Annually  Once n 2018   HIV Z11.4 All adults  based on risk  Annually btw ages 38 & 37 regardless of risk  Annually > 65 yrs if at increased risk n 2011   Lung Cancer Screening Asymptomatic adults aged 51-77 with 30 pack yr history and current smoker OR quit within the last 15 yrs Annually Must have counseling and shared decision making documentation before first screen n    Medical Nutrition Therapy Adults with   Diabetes  Renal disease  Kidney transplant within past 3 yrs 3 hours first year; 2 hours subsequent years n    Obesity and Counseling All adults Screening once a year Counseling if BMI 30 or higher y Today   Tobacco Use Counseling Adults who use tobacco  Up to 8 visits in one year     Vaccines Z23  Hepatitis B  Influenza   Pneumonia  Adults   Once  Once every flu season  Two different vaccines separated by one year   COVID mRNA vaccine (if not already done)  Next Annual Wellness Visit People with Medicare Every year  Today     Services & Screenings Women Who How Often Need  Date of Last Service Action  Mammogram  Z12.31 Women over 59 One baseline ages 53-39. Annually ager 40 yrs+ n 2021   Pap tests All women Annually if  high risk. Every 2 yrs for normal risk women n    Screening for cervical cancer with   Pap (Z01.419 nl or Z01.411abnl) &  HPV Z11.51 Women aged 58 to 29 Once every 5 yrs n    Screening pelvic and breast exams All women Annually if high risk. Every 2 yrs for normal risk women n    Sexually Transmitted Diseases  Chlamydia  Gonorrhea  Syphilis All at risk adults Annually for non pregnant females at increased risk n        Services & Screenings Men Who How Ofter Need  Date of Last Service Action  Prostate Cancer - DRE & PSA Men over 50 Annually.  DRE might require a copay.     Sexually Transmitted Diseases  Syphilis All at risk adults Annually for men at increased risk

## 2020-09-20 ENCOUNTER — Encounter: Payer: Self-pay | Admitting: Internal Medicine

## 2020-09-20 ENCOUNTER — Other Ambulatory Visit: Payer: Self-pay

## 2020-09-20 ENCOUNTER — Ambulatory Visit (INDEPENDENT_AMBULATORY_CARE_PROVIDER_SITE_OTHER): Payer: Medicare Other | Admitting: Internal Medicine

## 2020-09-20 VITALS — BP 116/67 | HR 78 | Temp 97.8°F | Ht 67.0 in | Wt 286.8 lb

## 2020-09-20 DIAGNOSIS — M1711 Unilateral primary osteoarthritis, right knee: Secondary | ICD-10-CM

## 2020-09-20 DIAGNOSIS — Z79891 Long term (current) use of opiate analgesic: Secondary | ICD-10-CM | POA: Diagnosis not present

## 2020-09-20 DIAGNOSIS — G8929 Other chronic pain: Secondary | ICD-10-CM | POA: Diagnosis not present

## 2020-09-20 DIAGNOSIS — Z6841 Body Mass Index (BMI) 40.0 and over, adult: Secondary | ICD-10-CM

## 2020-09-20 MED ORDER — HYDROCODONE-ACETAMINOPHEN 10-325 MG PO TABS: 1.0000 | ORAL_TABLET | Freq: Four times a day (QID) | ORAL | 0 refills | Status: DC | PRN

## 2020-09-20 NOTE — Patient Instructions (Signed)
COVID-19 Vaccine Information can be found at: [email or web address] For questions related to vaccine distribution or appointments, please email [email or web address] or call [phone].    

## 2020-09-21 NOTE — Assessment & Plan Note (Signed)
HPI: She is requesting a repeat steroid injection in her right knee historically this has provided her great relief and reduction of pain medication.  I am also very impressed that she is lost almost 40 pounds in the past year I encouraged her to keep up the good work.  Assessment OA of right knee  Plan Steroid injection today Continue pain medication use.

## 2020-09-21 NOTE — Assessment & Plan Note (Signed)
HPI: She remains on Ozempic she has had excellent weight loss.  Almost 40 pounds in the last year.  I encouraged her to progress and advised her to continue the course.  Assessment morbid obesity  Plan Continue Ozempic

## 2020-09-21 NOTE — Progress Notes (Signed)
  Subjective:  HPI: [name] is a 60 y.o. female who presents for obesity, chronic pain/ OA  Please see Assessment and Plan below for the status of her chronic medical problems.  Objective:  Physical Exam: Vitals:   09/20/20 1026  BP: 116/67  Pulse: 78  Temp: 97.8 F (36.6 C)  TempSrc: Oral  SpO2: 99%  Weight: 286 lb 12.8 oz (130.1 kg)  Height: 5\' 7"  (1.702 m)   Body mass index is 44.92 kg/m. Physical Exam Vitals and nursing note reviewed.  Constitutional:      Appearance: Normal appearance. She is obese.  Cardiovascular:     Rate and Rhythm: Normal rate and regular rhythm.  Pulmonary:     Effort: Pulmonary effort is normal.     Breath sounds: Normal breath sounds.  Musculoskeletal:     Right knee: Bony tenderness and crepitus present. No effusion. Tenderness present over the medial joint line.     Left knee: Bony tenderness present. No effusion.  Neurological:     Mental Status: She is alert.    Assessment & Plan:  See Encounters Tab for problem based charting.  Medications Ordered Meds ordered this encounter  Medications  . HYDROcodone-acetaminophen (NORCO) 10-325 MG tablet    Sig: Take 1 tablet by mouth every 6 (six) hours as needed for severe pain.    Dispense:  100 tablet    Refill:  0    Rx 1/3  . HYDROcodone-acetaminophen (NORCO) 10-325 MG tablet    Sig: Take 1 tablet by mouth every 6 (six) hours as needed for severe pain.    Dispense:  100 tablet    Refill:  0    Rx 2/3  . HYDROcodone-acetaminophen (NORCO) 10-325 MG tablet    Sig: Take 1 tablet by mouth every 6 (six) hours as needed for severe pain.    Dispense:  100 tablet    Refill:  0    Rx 3/3   Other Orders No orders of the defined types were placed in this encounter.  Follow Up: Return in about 3 months (around 12/21/2020).  PROCEDURE NOTE  PROCEDURE: right knee joint steroid injection.  PREOPERATIVE DIAGNOSIS: Osteoarthritis of the right knee.  POSTOPERATIVE DIAGNOSIS:  Osteoarthritis of the right knee.  PROCEDURE: The patient was apprised of the risks and the benefits of the procedure and informed consent was obtained, as witnessed by [facility]. Time-out procedure was performed, with confirmation of the patient's name, date of birth, and correct identification of the right knee to be injected. The patient's knee was then marked at the appropriate site for injection placement. The knee was sterilely prepped with Betadine. A 40 mg (1 milliliter) solution of Kenalog was drawn up into a 5 mL syringe with a 2 mL of 1% lidocaine. The patient was injected with a 25-gauge needle at the anterior medial aspect of her right flexed knee. There were no complications. The patient tolerated the procedure well. There was minimal bleeding. The patient was instructed to ice her knee upon leaving clinic and refrain from overuse over the next 3 days. The patient was instructed to go to the emergency room with any usual pain, swelling, or redness occurred in the injected area. The patient was given a followup appointment to evaluate response to the injection to his increased range of motion and reduction of pain.

## 2020-09-21 NOTE — Assessment & Plan Note (Signed)
As previously noted she continues to have pain in her shoulders and knees.  She is unable/unwilling to go forward with any orthopedic procedures that may require sedation due to previous adverse event during elective surgery.  We have used steroid injections and opioid pain medications with good success and she remains very functional.  We have also tried to work on weight loss which she is doing very well with a reduction of almost 40 pounds in the past year.  Reviewed database appropriate no red flag behavior.  We will have her continue her current dose of hydrocodone acetaminophen  100 pills/month she will continue gabapentin as well.

## 2020-10-01 ENCOUNTER — Other Ambulatory Visit: Payer: Self-pay

## 2020-10-01 ENCOUNTER — Other Ambulatory Visit: Payer: Self-pay | Admitting: Interventional Cardiology

## 2020-10-01 DIAGNOSIS — I509 Heart failure, unspecified: Secondary | ICD-10-CM

## 2020-10-01 NOTE — Telephone Encounter (Signed)
Requesting to speak with a nurse about  furosemide (LASIX) 40 MG tablet. Please call pt back.

## 2020-10-01 NOTE — Telephone Encounter (Signed)
Return call from pt- Requests dose change for lasix.   States it's causing her to urinate more frequently (pt denies fever, abd pain, dysuria, urgency)  Taking semaglutide and losing weight-requests trial lower dose. Will send request to pcp for review-please advise

## 2020-10-03 MED ORDER — FUROSEMIDE 20 MG PO TABS
20.0000 mg | ORAL_TABLET | Freq: Every day | ORAL | 1 refills | Status: DC
Start: 2020-10-03 — End: 2021-04-02

## 2020-10-03 NOTE — Telephone Encounter (Signed)
Called, no fever or dysuria.  Will trial reduced dose of 20mg ,  Also asked if she needed to stay on protonix, not having any heartburn symptoms. Discussed would be fine to have a trial off protonix.

## 2020-10-03 NOTE — Telephone Encounter (Signed)
Pt is waiting a phone call from the nurse about decrease on furosemide (LASIX) 40 MG tablet. Please call pt back.

## 2020-10-11 DIAGNOSIS — G4733 Obstructive sleep apnea (adult) (pediatric): Secondary | ICD-10-CM | POA: Diagnosis not present

## 2020-10-16 ENCOUNTER — Other Ambulatory Visit: Payer: Self-pay | Admitting: *Deleted

## 2020-10-16 NOTE — Patient Instructions (Signed)
Goals Addressed            This Visit's Progress   . (THN)Eat Healthy   On track    Timeframe:  Long-Range Goal Priority:  High Start Date:       [id number]                      Expected End Date:  92924462                  Follow Up Date 86381771   - change to whole grain breads, cereal, pasta - set goal weight - drink 6 to 8 glasses of water each day - fill half of plate with vegetables - limit fast food meals to no more than 1 per week - manage portion size - set a realistic goal    Why is this important?   When you are ready to manage your nutrition or weight, having a plan and setting goals will help.  Taking small steps to change how you eat and exercise is a good place to start.    Notes:  RN discussed healthy meals and snacks    . (THN)Learn More About My Health   On track    Timeframe:  Long-Range Goal Priority:  Medium Start Date:  16579038                             Expected End Date:  33383291                   Follow Up Date 91660600   - make a list of questions - ask questions - bring a list of my medicines to the visit - speak up when I don't understand    Why is this important?   The best way to learn about your health and care is by talking to the doctor and nurse.  They will answer your questions and give you information in the way that you like best.    Notes:  Patient is going to ask Dr if ok for her to get the COVID vaccines    . (THN)Lifestyle Change       Timeframe:  Long-Range Goal Priority:  High Start Date:    45997741                         Expected End Date: 42395320                  Follow Up Date 23343568   - ask questions to understand - learn about high blood pressure    Why is this important?   The changes that you are asked to make may be hard to do.  This is especially true when the changes are life-long.  Knowing why it is important to you is the first step.  Working on the change with your family or support person  helps you not feel alone.  Reward yourself and family or support person when goals are met. This can be an activity you choose like bowling, hiking, biking, swimming or shooting hoops.     Notes:  Patient understands she has to change her dietary eating habits    . (THN)Make and Keep All Appointments   On track    Timeframe:  Long-Range Goal Priority:  Medium Start Date:       61683729  Expected End Date:    29924268               Follow Up Date  [id number] - call to cancel if needed - keep a calendar with prescription refill dates - keep a calendar with appointment dates    Why is this important?   Part of staying healthy is seeing the doctor for follow-up care.  If you forget your appointments, there are some things you can do to stay on track.    Notes:     . [facility] My Cholesterol       Timeframe:  Long-Range Goal Priority:  Medium Start Date:     97989211                        Expected End Date:    94174081               Follow Up Date [id number] - eat smaller or less servings of red meat - read food labels for fat and fiber    Why is this important?   Changing cholesterol starts with eating heart-healthy foods.  Other steps may be to increase your activity and to quit if you smoke.    Notes:  Patient needs to change her eating habits that will help improve her cholesterol    . (THN)Stop or Cut Down Having Caffeine       Timeframe:  Long-Range Goal Priority:  Medium Start Date:  14970263                           Expected End Date:  78588502                  Follow Up Date 77412878 - reduce caffeine intake a little at a time    Why is this important?   If you want to decrease the amount of caffeine there are steps you can take to do it.  First, learn to read labels to see if a food or drink contains caffeine. Then, take steps to cut back or stop.    Notes: Patient will have to reduce caffeine such as tea a little at a time and drink more  water     . (THN)Track and Manage My Blood Pressure   On track    ,Timeframe:  Long-Range Goal Priority:  High Start Date:     67672094                        Expected End Date:  70962836              Follow Up Date 62947654   - check blood pressure daily - check blood pressure weekly - choose a place to take my blood pressure (home, clinic or office, retail store) - write blood pressure results in a log or diary    Why is this important?   You won't feel high blood pressure, but it can still hurt your blood vessels.  High blood pressure can cause heart or kidney problems. It can also cause a stroke.  Making lifestyle changes like losing a little weight or eating less salt will help.  Checking your blood pressure at home and at different times of the day can help to control blood pressure.  If the doctor prescribes medicine remember to take it the way the doctor ordered.  Call the office if you  cannot afford the medicine or if there are questions about it.     Notes:  RN discussed the findings in the Dr office and the importance of taking her blood pressure at home and documenting.

## 2020-10-16 NOTE — Patient Outreach (Signed)
[facility]) Care Management  [place]  10/16/2020   Breanna Wilkins 12/17/1960 [id number]  RN Health Coach telephone call to patient.  [employer] compliance verified. Per patient she is doing good. Her blood pressure is better controlled. Patient stated her diuretic was decreased. Her weight was decreased from 303 pounds on 12/16 to 286 pounds on 03/03.   Patient has not received her COVID shots. Patient has agreed to further outreach calls.   Encounter Medications:  Outpatient Encounter Medications as of 10/16/2020  Medication Sig  . aspirin 81 MG chewable tablet Chew 1 tablet (81 mg total) by mouth daily.  . carvedilol (COREG) 6.25 MG tablet TAKE 1 TABLET BY MOUTH TWICE DAILY WITH MEALS  . cyclobenzaprine (FEXMID) 7.5 MG tablet Take 1 tablet (7.5 mg total) by mouth 3 (three) times daily as needed for muscle spasms.  . diclofenac Sodium (VOLTAREN) 1 % GEL Apply 4g to knee four times a day as needed for pain, apply 2g to shoulder as needed for pain  . furosemide (LASIX) 20 MG tablet Take 1 tablet (20 mg total) by mouth daily.  Marland Kitchen HYDROcodone-acetaminophen (NORCO) 10-325 MG tablet Take 1 tablet by mouth every 6 (six) hours as needed for severe pain.  Derrill Memo ON 10/20/2020] HYDROcodone-acetaminophen (NORCO) 10-325 MG tablet Take 1 tablet by mouth every 6 (six) hours as needed for severe pain.  Derrill Memo ON 11/19/2020] HYDROcodone-acetaminophen (NORCO) 10-325 MG tablet Take 1 tablet by mouth every 6 (six) hours as needed for severe pain.  . Insulin Pen Needle (NOVOFINE) 32G X 6 MM MISC 1 Units by Does not apply route daily.  Marland Kitchen lidocaine (XYLOCAINE) 5 % ointment Apply 1 application topically 2 (two) times daily as needed.  Marland Kitchen olmesartan (BENICAR) 40 MG tablet Take 1 tablet (40 mg total) by mouth daily.  . pantoprazole (PROTONIX) 40 MG tablet TAKE 1 TABLET BY MOUTH 30 MINUTES BEFORE LUNCH OR DINNER  . polyethylene glycol (MIRALAX / GLYCOLAX) packet Take 17 g by mouth daily.  .  rivaroxaban (XARELTO) 20 MG TABS tablet Take 1 tablet (20 mg total) by mouth daily with supper.  . rosuvastatin (CRESTOR) 5 MG tablet Take 1 tablet (5 mg total) by mouth at bedtime.  . Semaglutide, 1 MG/DOSE, 4 MG/3ML SOPN Inject 1 mg into the skin once a week.   No facility-administered encounter medications on file as of 10/16/2020.    Functional Status:  In your present state of health, do you have any difficulty performing the following activities: 09/20/2020 07/05/2020  Hearing? N N  Vision? N Y  Comment - pt stated that she is noticing a little blury in her vision  Difficulty concentrating or making decisions? N N  Walking or climbing stairs? Y N  Dressing or bathing? N N  Doing errands, shopping? N N  Some recent data might be hidden    Fall/Depression Screening: Fall Risk  10/16/2020 09/20/2020 07/19/2020  Falls in the past year? 0 0 0  Number falls in past yr: 0 - 0  Injury with Fall? 0 - 1  Risk for fall due to : Impaired balance/gait;Impaired mobility No Fall Risks No Fall Risks  Risk for fall due to: Comment - - -  Follow up Falls evaluation completed - Falls evaluation completed   PHQ 2/9 Scores 09/20/2020 07/05/2020 05/22/2020 01/26/2020 12/21/2019 10/20/2019 09/14/2019  PHQ - 2 Score 0 0 0 0 0 0 0  PHQ- 9 Score - 0 - - 0 0 -  Assessment:  Goals Addressed            This Visit's Progress   . (THN)Eat Healthy   On track    Timeframe:  Long-Range Goal Priority:  High Start Date:       [id number]                      Expected End Date:  62863817                  Follow Up Date 71165790   - change to whole grain breads, cereal, pasta - set goal weight - drink 6 to 8 glasses of water each day - fill half of plate with vegetables - limit fast food meals to no more than 1 per week - manage portion size - set a realistic goal    Why is this important?   When you are ready to manage your nutrition or weight, having a plan and setting goals will help.  Taking small steps  to change how you eat and exercise is a good place to start.    Notes:  RN discussed healthy meals and snacks    . (THN)Learn More About My Health   On track    Timeframe:  Long-Range Goal Priority:  Medium Start Date:  38333832                             Expected End Date:  91916606                   Follow Up Date 00459977   - make a list of questions - ask questions - bring a list of my medicines to the visit - speak up when I don't understand    Why is this important?   The best way to learn about your health and care is by talking to the doctor and nurse.  They will answer your questions and give you information in the way that you like best.    Notes:  Patient is going to ask Dr if ok for her to get the COVID vaccines    . (THN)Lifestyle Change       Timeframe:  Long-Range Goal Priority:  High Start Date:    41423953                         Expected End Date: 20233435                  Follow Up Date 68616837   - ask questions to understand - learn about high blood pressure    Why is this important?   The changes that you are asked to make may be hard to do.  This is especially true when the changes are life-long.  Knowing why it is important to you is the first step.  Working on the change with your family or support person helps you not feel alone.  Reward yourself and family or support person when goals are met. This can be an activity you choose like bowling, hiking, biking, swimming or shooting hoops.     Notes:  Patient understands she has to change her dietary eating habits    . (THN)Make and Keep All Appointments   On track    Timeframe:  Long-Range Goal Priority:  Medium Start Date:       29021115  Expected End Date:    42595638               Follow Up Date  75643329 - call to cancel if needed - keep a calendar with prescription refill dates - keep a calendar with appointment dates    Why is this important?   Part of staying  healthy is seeing the doctor for follow-up care.  If you forget your appointments, there are some things you can do to stay on track.    Notes:     . [facility] My Cholesterol       Timeframe:  Long-Range Goal Priority:  Medium Start Date:     51884166                        Expected End Date:    06301601               Follow Up Date 09323557 - eat smaller or less servings of red meat - read food labels for fat and fiber    Why is this important?   Changing cholesterol starts with eating heart-healthy foods.  Other steps may be to increase your activity and to quit if you smoke.    Notes:  Patient needs to change her eating habits that will help improve her cholesterol    . (THN)Stop or Cut Down Having Caffeine       Timeframe:  Long-Range Goal Priority:  Medium Start Date:  32202542                           Expected End Date:  70623762                  Follow Up Date 83151761 - reduce caffeine intake a little at a time    Why is this important?   If you want to decrease the amount of caffeine there are steps you can take to do it.  First, learn to read labels to see if a food or drink contains caffeine. Then, take steps to cut back or stop.    Notes: Patient will have to reduce caffeine such as tea a little at a time and drink more water     . (THN)Track and Manage My Blood Pressure   On track    ,Timeframe:  Long-Range Goal Priority:  High Start Date:     60737106                        Expected End Date:  26948546              Follow Up Date 27035009   - check blood pressure daily - check blood pressure weekly - choose a place to take my blood pressure (home, clinic or office, retail store) - write blood pressure results in a log or diary    Why is this important?   You won't feel high blood pressure, but it can still hurt your blood vessels.  High blood pressure can cause heart or kidney problems. It can also cause a stroke.  Making lifestyle changes like  losing a little weight or eating less salt will help.  Checking your blood pressure at home and at different times of the day can help to control blood pressure.  If the doctor prescribes medicine remember to take it the way the doctor ordered.  Call the office if  you cannot afford the medicine or if there are questions about it.     Notes:  RN discussed the findings in the Dr office and the importance of taking her blood pressure at home and documenting.       Plan:  Follow-up:  Patient agrees to Care Plan and Follow-up. RN ordered free government  COVID vaccine kits Patient will continue with weight loss Patient will monitor blood pressure at home RN will follow up within the month of June RN sent assessment update to PCP  [place] [facility] [phone]

## 2020-11-28 ENCOUNTER — Other Ambulatory Visit: Payer: Self-pay | Admitting: *Deleted

## 2020-11-28 MED ORDER — SEMAGLUTIDE (1 MG/DOSE) 4 MG/3ML ~~LOC~~ SOPN
1.0000 mg | PEN_INJECTOR | SUBCUTANEOUS | 1 refills | Status: DC
Start: 2020-11-28 — End: 2021-03-22

## 2021-01-02 ENCOUNTER — Other Ambulatory Visit: Payer: Self-pay

## 2021-01-02 MED ORDER — DICLOFENAC SODIUM 1 % EX GEL: CUTANEOUS | 3 refills | Status: DC

## 2021-01-15 ENCOUNTER — Other Ambulatory Visit: Payer: Self-pay | Admitting: *Deleted

## 2021-01-15 ENCOUNTER — Encounter: Payer: Self-pay | Admitting: Nurse Practitioner

## 2021-01-15 ENCOUNTER — Other Ambulatory Visit: Payer: Self-pay | Admitting: Internal Medicine

## 2021-01-15 MED ORDER — HYDROCODONE-ACETAMINOPHEN 10-325 MG PO TABS: 1.0000 | ORAL_TABLET | Freq: Four times a day (QID) | ORAL | 0 refills | Status: DC | PRN

## 2021-01-16 NOTE — Patient Instructions (Signed)
Goals Addressed             This Visit's Progress    (THN)Eat Healthy   On track    Timeframe:  Long-Range Goal Priority:  High Start Date:       16109604                      Expected End Date:  54098119                  Follow Up Date 14782956   - change to whole grain breads, cereal, pasta - set goal weight - drink 6 to 8 glasses of water each day - fill half of plate with vegetables - limit fast food meals to no more than 1 per week - manage portion size - set a realistic goal    Why is this important?   When you are ready to manage your nutrition or weight, having a plan and setting goals will help.  Taking small steps to change how you eat and exercise is a good place to start.    Notes:  RN discussed healthy meals and snacks [id number] She is learning to adhere to her diet.      (THN)Learn More About My Health   On track    Timeframe:  Long-Range Goal Priority:  Medium Start Date:  84696295                             Expected End Date:  28413244                   Follow Up Date 01027253   - make a list of questions - ask questions - bring a list of my medicines to the visit - speak up when I don't understand    Why is this important?   The best way to learn about your health and care is by talking to the doctor and nurse.  They will answer your questions and give you information in the way that you like best.    Notes:  Patient is going to ask Dr if ok for her to get the [place] vaccines [id number] patient is learning about health maintenance to make her health better.      (THN)Lifestyle Change   On track    Timeframe:  Long-Range Goal Priority:  High Start Date:    42595638                         Expected End Date: 75643329                  Follow Up Date 51884166   - ask questions to understand - learn about high blood pressure    Why is this important?   The changes that you are asked to make may be hard to do.  This is especially true when the  changes are life-long.  Knowing why it is important to you is the first step.  Working on the change with your family or support person helps you not feel alone.  Reward yourself and family or support person when goals are met. This can be an activity you choose like bowling, hiking, biking, swimming or shooting hoops.     Notes:  Patient understands she has to change her dietary eating habits 06301601 She is making dietary changes and is showing some weight loss.  She will need to work more on exercise routine      [facility] and Keep All Appointments   On track    Timeframe:  Long-Range Goal Priority:  Medium Start Date:       26333545                      Expected End Date:    62563893               Follow Up Date  73428768 - call to cancel if needed - keep a calendar with prescription refill dates - keep a calendar with appointment dates    Why is this important?   Part of staying healthy is seeing the doctor for follow-up care.  If you forget your appointments, there are some things you can do to stay on track.    Notes:  11572620 Patient has scheduled dental appointment for 01/28/2021 She will schedule eye exam      [facility] My Cholesterol   On track    Timeframe:  Long-Range Goal Priority:  Medium Start Date:     35597416                        Expected End Date:    38453646               Follow Up Date 80321224 - eat smaller or less servings of red meat - read food labels for fat and fiber    Why is this important?   Changing cholesterol starts with eating heart-healthy foods.  Other steps may be to increase your activity and to quit if you smoke.    Notes:  Patient needs to change her eating habits that will help improve her cholesterol 82500370 Patient is doing better with her diet.       (THN)Stop or Cut Down Having Caffeine   On track    Timeframe:  Long-Range Goal Priority:  Medium Start Date:  48889169                           Expected End Date:   45038882                  Follow Up Date 80034917 - reduce caffeine intake a little at a time    Why is this important?   If you want to decrease the amount of caffeine there are steps you can take to do it.  First, learn to read labels to see if a food or drink contains caffeine. Then, take steps to cut back or stop.    Notes: Patient will have to reduce caffeine such as tea a little at a time and drink more water  91505697 Patient is drinking more water      (THN)Track and Manage My Blood Pressure   On track    ,Timeframe:  Long-Range Goal Priority:  High Start Date:     94801655                        Expected End Date:  37482707              Follow Up Date 86754492   - check blood pressure daily - check blood pressure weekly - choose a place to take my blood pressure (home, clinic or office, retail store) - write blood pressure results in a log or  diary    Why is this important?   You won't feel high blood pressure, but it can still hurt your blood vessels.  High blood pressure can cause heart or kidney problems. It can also cause a stroke.  Making lifestyle changes like losing a little weight or eating less salt will help.  Checking your blood pressure at home and at different times of the day can help to control blood pressure.  If the doctor prescribes medicine remember to take it the way the doctor ordered.  Call the office if you cannot afford the medicine or if there are questions about it.     Notes:  RN discussed the findings in the Dr office and the importance of taking her blood pressure at home and documenting. 50277412 Blood pressure readings are better

## 2021-01-16 NOTE — Patient Outreach (Signed)
[facility]) Care Management  [place]  01/16/2021   Breanna Wilkins 1960/10/19 [phone] RN Health Coach telephone call to patient.  [employer] compliance verified. Per patient her blood pressure readings are better. She is loosing weight. She has scheduled appointment for dental care. She still needs to schedule eye exam, She is having some numbness in hand from carpal tunnel. She has bought a brace for hand. RN discussed wearing it and for patient to discuss with Dr about analgesic creams for discomfort. Patient has agreed to follow up outreach calls.    Encounter Medications:  Outpatient Encounter Medications as of 01/15/2021  Medication Sig   aspirin 81 MG chewable tablet Chew 1 tablet (81 mg total) by mouth daily.   carvedilol (COREG) 6.25 MG tablet TAKE 1 TABLET BY MOUTH TWICE DAILY WITH MEALS   cyclobenzaprine (FEXMID) 7.5 MG tablet Take 1 tablet (7.5 mg total) by mouth 3 (three) times daily as needed for muscle spasms.   diclofenac Sodium (VOLTAREN) 1 % GEL Apply 4g to knee four times a day as needed for pain, apply 2g to shoulder as needed for pain   furosemide (LASIX) 20 MG tablet Take 1 tablet (20 mg total) by mouth daily.   [START ON 01/18/2021] HYDROcodone-acetaminophen (NORCO) 10-325 MG tablet Take 1 tablet by mouth every 6 (six) hours as needed for severe pain.   Insulin Pen Needle (NOVOFINE) 32G X 6 MM MISC 1 Units by Does not apply route daily.   lidocaine (XYLOCAINE) 5 % ointment Apply 1 application topically 2 (two) times daily as needed.   olmesartan (BENICAR) 40 MG tablet Take 1 tablet (40 mg total) by mouth daily.   pantoprazole (PROTONIX) 40 MG tablet TAKE 1 TABLET BY MOUTH 30 MINUTES BEFORE LUNCH OR DINNER   polyethylene glycol (MIRALAX / GLYCOLAX) packet Take 17 g by mouth daily.   rivaroxaban (XARELTO) 20 MG TABS tablet Take 1 tablet (20 mg total) by mouth daily with supper.   rosuvastatin (CRESTOR) 5 MG tablet Take 1 tablet (5 mg total) by  mouth at bedtime.   Semaglutide, 1 MG/DOSE, 4 MG/3ML SOPN Inject 1 mg into the skin once a week.   No facility-administered encounter medications on file as of 01/15/2021.    Functional Status:  In your present state of health, do you have any difficulty performing the following activities: 09/20/2020 07/05/2020  Hearing? N N  Vision? N Y  Comment - pt stated that she is noticing a little blury in her vision  Difficulty concentrating or making decisions? N N  Walking or climbing stairs? Y N  Dressing or bathing? N N  Doing errands, shopping? N N  Some recent data might be hidden    Fall/Depression Screening: Fall Risk  01/15/2021 10/16/2020 09/20/2020  Falls in the past year? 0 0 0  Number falls in past yr: - 0 -  Injury with Fall? 0 0 -  Risk for fall due to : Impaired balance/gait;Impaired mobility Impaired balance/gait;Impaired mobility No Fall Risks  Risk for fall due to: Comment - - -  Follow up Falls evaluation completed Falls evaluation completed -   PHQ 2/9 Scores 09/20/2020 07/05/2020 05/22/2020 01/26/2020 12/21/2019 10/20/2019 09/14/2019  PHQ - 2 Score 0 0 0 0 0 0 0  PHQ- 9 Score - 0 - - 0 0 -    Assessment:   Care Plan Care Plan : Hypertension (Adult)  Updates made by Verlin Grills, RN since 01/16/2021 12:00 AM  Problem: Hypertension (Hypertension)   Priority: Medium  Onset Date: 07/19/2020     Long-Range Goal: Hypertension Monitored   Start Date: 07/19/2020  Expected End Date: 07/19/2021  This Visit's Progress: On track  Priority: High  Note:   Evidence-based guidance:  Promote initial use of ambulatory blood pressure measurements (for 3 days) to rule out "white-coat" effect; identify masked hypertension and presence or absence of nocturnal "dipping" of blood pressure.   Encourage continued use of home blood pressure monitoring and recording in blood pressure log; include symptoms of hypotension or potential medication side effects in log.  Review blood  pressure measurements taken inside and outside of the provider office; establish baseline and monitor trends; compare to target ranges or patient goal.  Share overall cardiovascular risk with patient; encourage changes to lifestyle risk factors, including alcohol consumption, smoking, inadequate exercise, poor dietary habits and stress.   Notes:     Task: Identify and Monitor Blood Pressure Elevation   Due Date: 07/19/2021  Note:   Care Management Activities:    - home or ambulatory blood pressure monitoring encouraged    Notes:  [id number] Patient is monitoring blood pressure and is seeing the readings are getting better    Problem: Disease Progression (Hypertension)   Priority: Medium  Onset Date: 07/19/2020     Long-Range Goal: (THN)Disease Progression Prevented or Minimized   Start Date: 07/19/2020  Expected End Date: 07/19/2021  This Visit's Progress: On track  Priority: Medium  Note:   Evidence-based guidance:  Tailor lifestyle advice to individual; review progress regularly; give frequent encouragement and respond positively to incremental successes.  Assess for and promote awareness of worsening disease or development of comorbidity.  Prepare patient for laboratory and diagnostic exams based on risk and presentation.  Prepare patient for use of pharmacologic therapy that may include diuretic, beta-blocker, beta-blocker/thiazide combination, angiotensin-converting enzyme inhibitor, renin-angiotensin blocker or calcium-channel blocker.  Expect periodic adjustments to pharmacologic therapy; manage side effects.  Promote a healthy diet that includes primarily plant-based foods, such as fruits, vegetables, whole grains, beans and legumes, low-fat dairy and lean meats.   Consider moderate reduction in sodium intake by avoiding the addition of salt to prepared foods and limiting processed meats, canned soup, frozen meals and salty snacks.   Promote a regular, daily exercise goal of  150 minutes per week of moderate exercise based on tolerance, ability and patient choice; consider referral to physical therapist, community wellness and/or activity program.  Encourage the avoidance of no more than 2 hours per day of sedentary activity, such as recreational screen time.  Review sources of stress; explore current coping strategies and encourage use of mindfulness, yoga, meditation or exercise to manage stress.   Notes:     Task: Alleviate Barriers to Hypertension Treatment   Due Date: 07/19/2021  Note:   Care Management Activities:    - healthy diet promoted - quality of sleep assessed - reduction in sedentary activities encouraged - sleep hygiene techniques encouraged    Notes:  [id number] Patient is doing well with weight loss    Problem: Resistant Hypertension (Hypertension)   Priority: Medium  Onset Date: 07/19/2020     Long-Range Goal: Response to Treatment Maximized   Start Date: 07/19/2020  Expected End Date: 07/19/2021  This Visit's Progress: On track  Priority: Medium  Note:   Evidence-based guidance:  Assess patient response to treatment, including presence or absence of medication side effects, degree of blood pressure control and patient satisfaction.  Assess technique (including cuff  size and placement), measurement times, condition and calibration of blood pressure cuff set (both at-home and in-office equipment).  Assess factors that may influence response to treatment, including nonadherence to pharmacologic treatment plan, diet or activity changes and/or presence of pain, stress or sleep disturbance.  Screen for signs and symptoms of depression; if present, refer for or complete a comprehensive assessment.  Evaluate social and economic barriers that may affect adherence to treatment plan  Address pharmacologic nonadherence by simplifying dosing regimen, counseling or support by pharmacist, financial assistance, self-monitoring of blood pressure, use  of motivational interviewing, voice or text messages.  Encourage behavioral adherence strategies, like habit-based interventions that link medication taking with existing daily routines.  Assess barriers to regular, daily physical activity; support family or support person-oriented activity changes and utilization of community activity or sports program.  Address barriers to dietary changes, especially sodium restriction, with referrals to community programs, like cooking classes, meal services or intensive education when available.  Refer to community-based peer support program or nurse home-visiting program.  Assess for chronic pain; when present add additional goals (Chronic Pain Care Plan Guide) as needed.  Provide frequent follow-up by telephone, telemonitoring, patient-practice portal or with home visit.  Review alcohol use screen; address using brief intervention beginning with risk that interferes with blood pressure control; refer for treatment when excessive alcohol use is noted.  Screen for obstructive sleep apnea; prepare patient for polysomnography based on risk and presentation and use of noninvasive ventilation to relieve obstructive sleep apnea when present.   Notes:     Task: Facilitate Adherence to Lifestyle Change   Due Date: 07/19/2021  Note:   Care Management Activities:    - home blood pressure monitoring technique reviewed - medication adherence assessment completed - success praised - support and encouragement provided    Notes:  Patient has received blood pressure monitor health coach sent Patient received the instructions on how to use the monitor [id number] Patient is doing better with diet and is having productive weight loss      Goals Addressed             This Visit's Progress    (THN)Eat Healthy   On track    Timeframe:  Long-Range Goal Priority:  High Start Date:       [id number]                      Expected End Date:  [id number]                   Follow Up Date [id number]   - change to whole grain breads, cereal, pasta - set goal weight - drink 6 to 8 glasses of water each day - fill half of plate with vegetables - limit fast food meals to no more than 1 per week - manage portion size - set a realistic goal    Why is this important?   When you are ready to manage your nutrition or weight, having a plan and setting goals will help.  Taking small steps to change how you eat and exercise is a good place to start.    Notes:  RN discussed healthy meals and snacks [id number] She is learning to adhere to her diet.      (THN)Learn More About My Health   On track    Timeframe:  Long-Range Goal Priority:  Medium Start Date:  [id number]  Expected End Date:  57322025                   Follow Up Date [id number]   - make a list of questions - ask questions - bring a list of my medicines to the visit - speak up when I don't understand    Why is this important?   The best way to learn about your health and care is by talking to the doctor and nurse.  They will answer your questions and give you information in the way that you like best.    Notes:  Patient is going to ask Dr if ok for her to get the [place] vaccines [id number] patient is learning about health maintenance to make her health better.      (THN)Lifestyle Change   On track    Timeframe:  Long-Range Goal Priority:  High Start Date:    [id number]                         Expected End Date: 06269485                  Follow Up Date [id number]   - ask questions to understand - learn about high blood pressure    Why is this important?   The changes that you are asked to make may be hard to do.  This is especially true when the changes are life-long.  Knowing why it is important to you is the first step.  Working on the change with your family or support person helps you not feel alone.  Reward yourself and family or support person when goals are met.  This can be an activity you choose like bowling, hiking, biking, swimming or shooting hoops.     Notes:  Patient understands she has to change her dietary eating habits [id number] She is making dietary changes and is showing some weight loss. She will need to work more on exercise routine      [facility] and Keep All Appointments   On track    Timeframe:  Long-Range Goal Priority:  Medium Start Date:       [id number]                      Expected End Date:    89381017               Follow Up Date  [id number] - call to cancel if needed - keep a calendar with prescription refill dates - keep a calendar with appointment dates    Why is this important?   Part of staying healthy is seeing the doctor for follow-up care.  If you forget your appointments, there are some things you can do to stay on track.    Notes:  [id number] Patient has scheduled dental appointment for 01/28/2021 She will schedule eye exam      [facility] My Cholesterol   On track    Timeframe:  Long-Range Goal Priority:  Medium Start Date:     36144315                        Expected End Date:    40086761               Follow Up Date 95093267 - eat smaller or less servings of red meat - read food labels for fat and fiber    Why is  this important?   Changing cholesterol starts with eating heart-healthy foods.  Other steps may be to increase your activity and to quit if you smoke.    Notes:  Patient needs to change her eating habits that will help improve her cholesterol [id number] Patient is doing better with her diet.       (THN)Stop or Cut Down Having Caffeine   On track    Timeframe:  Long-Range Goal Priority:  Medium Start Date:  16010932                           Expected End Date:  35573220                  Follow Up Date [id number] - reduce caffeine intake a little at a time    Why is this important?   If you want to decrease the amount of caffeine there are steps you can take to do it.  First, learn to  read labels to see if a food or drink contains caffeine. Then, take steps to cut back or stop.    Notes: Patient will have to reduce caffeine such as tea a little at a time and drink more water  [id number] Patient is drinking more water      (THN)Track and Manage My Blood Pressure   On track    ,Timeframe:  Long-Range Goal Priority:  High Start Date:     17616073                        Expected End Date:  71062694              Follow Up Date 85462703   - check blood pressure daily - check blood pressure weekly - choose a place to take my blood pressure (home, clinic or office, retail store) - write blood pressure results in a log or diary    Why is this important?   You won't feel high blood pressure, but it can still hurt your blood vessels.  High blood pressure can cause heart or kidney problems. It can also cause a stroke.  Making lifestyle changes like losing a little weight or eating less salt will help.  Checking your blood pressure at home and at different times of the day can help to control blood pressure.  If the doctor prescribes medicine remember to take it the way the doctor ordered.  Call the office if you cannot afford the medicine or if there are questions about it.     Notes:  RN discussed the findings in the Dr office and the importance of taking her blood pressure at home and documenting. [id number] Blood pressure readings are better         Plan:  Follow-up: Patient agrees to Care Plan and Follow-up. Patient will discuss with Dr on analgesic creams for carpal tunnel syndrome Patient will keep dental appointment July 11 th Patient will schedule eye exam RN will follow up within the month of September RN will send PCP update assessment

## 2021-01-17 ENCOUNTER — Encounter: Payer: Medicare Other | Admitting: Internal Medicine

## 2021-02-11 ENCOUNTER — Ambulatory Visit (INDEPENDENT_AMBULATORY_CARE_PROVIDER_SITE_OTHER): Payer: Medicare Other | Admitting: Pharmacist

## 2021-02-11 ENCOUNTER — Encounter: Payer: Self-pay | Admitting: Pharmacist

## 2021-02-11 ENCOUNTER — Other Ambulatory Visit: Payer: Self-pay

## 2021-02-11 VITALS — Ht 67.0 in | Wt 280.0 lb

## 2021-02-11 DIAGNOSIS — Z0000 Encounter for general adult medical examination without abnormal findings: Secondary | ICD-10-CM

## 2021-02-11 NOTE — Progress Notes (Signed)
This AWV is being conducted by [place] only. The patient was located at home and I was located in [facility]. The patient's identity was confirmed using their DOB and current address. The patient or his/her legal guardian has consented to being evaluated through a telephone encounter and understands the associated risks (an examination cannot be done and the patient may need to come in for an appointment) / benefits (allows the patient to remain at home, decreasing exposure to coronavirus). I personally spent 39 minutes conducting the AWV.  Subjective:   Breanna Wilkins is a 60 y.o. female who presents for a Medicare Annual Wellness Visit.  The following items have been reviewed and updated today in the appropriate area in the EMR.   Health Risk Assessment  Height, weight, BMI, and BP Visual acuity if needed Depression screen Fall risk / safety level Advance directive discussion Medical and family history were reviewed and updated Updating list of other providers & suppliers Medication reconciliation, including over the counter medicines Cognitive screen Written screening schedule Risk Factor list Personalized health advice, risky behaviors, and treatment advice  Social History   Social History Narrative   Financial assistance approved for 100% discount at [facility] and has [facility] card per [place]6/01/2010      Current Social History 02/11/2021        Patient lives with sister in a home  that is 1 story. There are 4 steps up to the entrance the patient uses. There is a railing.       Patient's method of transportation is personal car.      The highest level of education was some high school.      The patient currently is employed as aid on school bus.      Identified important Relationships are "my sister"       Pets : 0       Interests / Fun: "I like to fish and I love my job"       Current Stressors: "none"       Religious / Personal Beliefs: "I don't know"          Cardiac Risk Factors include: obesity (BMI >30kg/m2);sedentary lifestyle;hypertension    Objective:    Vitals: Ht 5' 7"  (1.702 m)   Wt 280 lb (127 kg)   BMI 43.85 kg/m  Vitals are patient reported  Activities of Daily Living In your present state of health, do you have any difficulty performing the following activities: 02/11/2021 09/20/2020  Hearing? N N  Vision? Y N  Comment - -  Difficulty concentrating or making decisions? N N  Walking or climbing stairs? Y Y  Dressing or bathing? N N  Doing errands, shopping? N N  Preparing Food and eating ? N -  Using the Toilet? N -  In the past six months, have you accidently leaked urine? N -  Do you have problems with loss of bowel control? N -  Managing your Medications? N -  Managing your Finances? N -  Housekeeping or managing your Housekeeping? N -  Some recent data might be hidden    Goals  Goals      (THN)Eat Healthy     Timeframe:  Long-Range Goal Priority:  High Start Date:       [id number]                      Expected End Date:  [id number]  Follow Up Date [id number]   - change to whole grain breads, cereal, pasta - set goal weight - drink 6 to 8 glasses of water each day - fill half of plate with vegetables - limit fast food meals to no more than 1 per week - manage portion size - set a realistic goal    Why is this important?   When you are ready to manage your nutrition or weight, having a plan and setting goals will help.  Taking small steps to change how you eat and exercise is a good place to start.    Notes:  RN discussed healthy meals and snacks [id number] She is learning to adhere to her diet.     (THN)Learn More About My Health     Timeframe:  Long-Range Goal Priority:  Medium Start Date:  [id number]                             Expected End Date:  [id number]                   Follow Up Date [id number]   - make a list of questions - ask questions - bring a list of my medicines to the  visit - speak up when I don't understand    Why is this important?   The best way to learn about your health and care is by talking to the doctor and nurse.  They will answer your questions and give you information in the way that you like best.    Notes:  Patient is going to ask Dr if ok for her to get the [place] vaccines [id number] patient is learning about health maintenance to make her health better.     (THN)Lifestyle Change     Timeframe:  Long-Range Goal Priority:  High Start Date:    25053976                         Expected End Date: [id number]                  Follow Up Date [id number]   - ask questions to understand - learn about high blood pressure    Why is this important?   The changes that you are asked to make may be hard to do.  This is especially true when the changes are life-long.  Knowing why it is important to you is the first step.  Working on the change with your family or support person helps you not feel alone.  Reward yourself and family or support person when goals are met. This can be an activity you choose like bowling, hiking, biking, swimming or shooting hoops.     Notes:  Patient understands she has to change her dietary eating habits [id number] She is making dietary changes and is showing some weight loss. She will need to work more on exercise routine     [facility] and Keep All Appointments     Timeframe:  Long-Range Goal Priority:  Medium Start Date:       [id number]                      Expected End Date:    29798921               Follow Up Date  [id number] - call to cancel if needed - keep a calendar with  prescription refill dates - keep a calendar with appointment dates    Why is this important?   Part of staying healthy is seeing the doctor for follow-up care.  If you forget your appointments, there are some things you can do to stay on track.    Notes:  [id number] Patient has scheduled dental appointment for 01/28/2021 She will schedule eye  exam     [facility] My Cholesterol     Timeframe:  Long-Range Goal Priority:  Medium Start Date:     [id number]                        Expected End Date:    49675916               Follow Up Date [id number] - eat smaller or less servings of red meat - read food labels for fat and fiber    Why is this important?   Changing cholesterol starts with eating heart-healthy foods.  Other steps may be to increase your activity and to quit if you smoke.    Notes:  Patient needs to change her eating habits that will help improve her cholesterol [id number] Patient is doing better with her diet.      (THN)Stop or Cut Down Having Caffeine     Timeframe:  Long-Range Goal Priority:  Medium Start Date:  39030092                           Expected End Date:  33007622                  Follow Up Date [id number] - reduce caffeine intake a little at a time    Why is this important?   If you want to decrease the amount of caffeine there are steps you can take to do it.  First, learn to read labels to see if a food or drink contains caffeine. Then, take steps to cut back or stop.    Notes: Patient will have to reduce caffeine such as tea a little at a time and drink more water  [id number] Patient is drinking more water     (THN)Track and Manage My Blood Pressure     ,Timeframe:  Long-Range Goal Priority:  High Start Date:     [id number]                        Expected End Date:  15726203              Follow Up Date [id number]   - check blood pressure daily - check blood pressure weekly - choose a place to take my blood pressure (home, clinic or office, retail store) - write blood pressure results in a log or diary    Why is this important?   You won't feel high blood pressure, but it can still hurt your blood vessels.  High blood pressure can cause heart or kidney problems. It can also cause a stroke.  Making lifestyle changes like losing a little weight or eating less salt will help.  Checking your  blood pressure at home and at different times of the day can help to control blood pressure.  If the doctor prescribes medicine remember to take it the way the doctor ordered.  Call the office if you cannot afford the medicine or if there are questions about it.  Notes:  RN discussed the findings in the Dr office and the importance of taking her blood pressure at home and documenting. [id number] Blood pressure readings are better     Blood Pressure < 140/90     CARE PLAN ENTRY (see longtitudinal plan of care for additional care plan information)  Objective:  Last practice recorded BP readings:  BP Readings from Last 3 Encounters:  01/26/20 (!) 117/58  12/21/19 129/69  10/20/19 (!) 168/92   Most recent eGFR/CrCl: No results found for: EGFR  No components found for: CRCL  Current Barriers:  Knowledge Deficits related to understanding of medications prescribed for management of hypertension Knowledge deficit related to self care management of hypertension Knowledge deficit related to dangers of uncontrolled hypertension Difficulty obtaining medications Behavior modification  Case Manager Clinical Goal(s):  Over the next 90 days, patient will verbalize understanding of plan for hypertension management Over the next 90 days, patient will attend all scheduled medical appointments: PCP and cardiologist Over the next 90 days, patient will demonstrate improved adherence to prescribed treatment plan for hypertension as evidenced by taking all medications as prescribed, monitoring and recording blood pressure as directed, adhering to low sodium/DASH diet Over the next 90 days, patient will demonstrate improved health management independence as evidenced by checking blood pressure as directed and notifying PCP if SBP>180 or DBP > 110, taking all medications as prescribe, and adhering to a low sodium diet as discussed. Over the next 90 days, patient will verbalize basic understanding of  hypertension disease process and self health management plan as evidenced by blood pressure <140/90 and weight loss. Over the next 90 days, patient will utilize portion control Over the next 90 days patient will try to start an exercise routine  Interventions:  Evaluation of current treatment plan related to hypertension self management and patient's adherence to plan as established by provider. Reviewed medications with patient and discussed importance of compliance Discussed plans with patient for ongoing care management follow up and provided patient with direct contact information for care management team Advised patient, providing education and rationale, to monitor blood pressure daily and record, calling PCP for findings outside established parameters.  Reviewed scheduled/upcoming provider appointments including:  Provided education regarding s/s of stroke and stroke prevention Provided education regarding s/s of heart attack Provided education regarding s/s of DASH diet/Low salt diet Provided education regarding complications of uncontrolled blood pressure Provided education regarding increasing physical activity Provided educational material: Exercise Program Activity Book Provided educational material:  EMMI (Weight Loss Tips, Weight Loss Diet, Chair Exercises, Relaxation Techniques, Low Sodium Diet, DASH Diet, Controlling your Blood Pressure through Lifestyle, High Blood Pressure Health Problems, High Blood Pressure Taking Your Blood Pressure, Lowering Your Rick of High Blood Pressure, Stress, etc)  Patient Self Care Activities:  Self administers medications as prescribed Attends all scheduled provider appointments Calls provider office for new concerns, questions, or BP outside discussed parameters Monitors BP and records as discussed Adheres to a low sodium diet/DASH diet Increase physical activity as tolerated Verbalize where to go to receive medical care Get a medical alert  system RN will follow up outreach within the month of October      Exercise 3x per week (30 min per time)     "I would love to be able to walk and start exercising"     LDL CALC < 100     Weight < 308 lb (139.708 kg)        Fall Risk Fall Risk  02/11/2021  01/15/2021 10/16/2020 09/20/2020 07/19/2020  Falls in the past year? 0 0 0 0 0  Number falls in past yr: 0 - 0 - 0  Injury with Fall? 0 0 0 - 1  Risk for fall due to : No Fall Risks Impaired balance/gait;Impaired mobility Impaired balance/gait;Impaired mobility No Fall Risks No Fall Risks  Risk for fall due to: Comment - - - - -  Follow up Falls evaluation completed;Falls prevention discussed Falls evaluation completed Falls evaluation completed - Falls evaluation completed    Depression Screen PHQ 2/9 Scores 02/11/2021 09/20/2020 07/05/2020 05/22/2020  PHQ - 2 Score 0 0 0 0  PHQ- 9 Score - - 0 -     Cognitive Testing Six-Item Cognitive Screener   "I would like to ask you some questions that ask you to use your memory. I am going to name three objects. Please wait until I say all three words, then repeat them. Remember what they are  because I am going to ask you to name them again in a few minutes. Please repeat these words for me: APPLE--TABLE--PENNY." (Interviewer may repeat names 3 times if necessary but repetition not scored.)  Did patient correctly repeat all three words? Yes - may proceed with screen  What year is this? Correct What month is this? Correct What day of the week is this? Correct  What were the three objects I asked you to remember? Apple Correct Table Correct Penny Correct  Score one point for each incorrect answer.  A score of 2 or more points warrants additional investigation.  Patient's score 0     Assessment and Plan:    During the course of the visit the patient was educated and counseled about appropriate screening and preventive services as documented in the assessment and plan.  Recommended  patient work towards goal of 150 minutes of moderate intensity exercise weekly.   Recommended patient schedule appointment for coloscopy.  Recommended patient receive the COVID-19 and Shingrix vaccinations. Patient is willing to think about it and is accepting of information provided.  The printed AVS was given to the patient and included an updated screening schedule, a list of risk factors, and personalized health advice.        Hughes Better, RPH-CPP  02/11/2021

## 2021-02-11 NOTE — Progress Notes (Signed)
I discussed the AWV findings with Dr. Georgina Peer who conducted the visit. I was present in the office suite and immediately available to provide assistance and direction throughout the time the service was provided.

## 2021-02-11 NOTE — Patient Instructions (Addendum)
Things That May Be Affecting Your Health:   Alcohol   Hearing loss   Pain    Depression   Home Safety   Sexual Health    Diabetes x Lack of physical activity   Stress    Difficulty with daily activities   Loneliness   Tiredness    Drug use   Medicines   Tobacco use    Falls   Motor Vehicle Safety x Weight    Food choices   Oral Health   Other      YOUR PERSONALIZED HEALTH PLAN : 1. Schedule your next subsequent Medicare Wellness visit in one year 2. Attend all of your regular appointments to address your medical issues 3. Complete the preventative screenings and services 4. Please consider receiving the vaccinations we discussed. I am included information in this packet regarding the vaccines. 5. Please schedule a colonscopy     Annual Wellness Visit                       Medicare Covered Preventative Screenings and [place] Men and Women Who How Often Need? Date of Last Service Action  Abdominal Aortic Aneurysm Adults with AAA risk factors Once y   May offer if has smoked total of >100 cigarettes in lifetime  Alcohol Misuse and Counseling All Adults Screening once a year if no alcohol misuse. Counseling up to 4 face to face sessions. y   Laverle Hobby to drink beer, historically no problematic drinking  Bone Density Measurement Adults at risk for osteoporosis Once every 2 yrs n      Lipid Panel Z13.6 All adults without CV disease Once every 5 yrs n      Colorectal Cancer Stool sample or Colonoscopy All adults 74 and older   Once every year Every 10 years y   Due for repeat in march  Depression All Adults Once a year   Today    Diabetes Screening Blood glucose, post glucose load, or GTT Z13.1 All adults at risk Pre-diabetics Once per year Twice per year n      Diabetes  Self-Management Training All adults Diabetics 10 hrs first year; 2 hours subsequent years. Requires Copay n      Glaucoma Diabetics Family history of glaucoma African Americans 47 yrs  + Hispanic Americans 43 yrs + Annually - requires coppay y      Hepatitis C Z72.89 or F19.20 High Risk for HCV Born between 1945 and 1965 Annually Once n 2018    HIV Z11.4 All adults based on risk Annually btw ages 26 & 11 regardless of risk Annually > 65 yrs if at increased risk n 2011    Lung Cancer Screening Asymptomatic adults aged 12-77 with 30 pack yr history and current smoker OR quit within the last 15 yrs Annually Must have counseling and shared decision making documentation before first screen n      Medical Nutrition Therapy Adults with Diabetes Renal disease Kidney transplant within past 3 yrs 3 hours first year; 2 hours subsequent years n      Obesity and Counseling All adults Screening once a year Counseling if BMI 30 or higher y Today    Tobacco Use Counseling Adults who use tobacco Up to 8 visits in one year        Vaccines Z23 Hepatitis B Influenza Pneumonia Adults   Once Once every flu season Two different vaccines separated by one year  COVID mRNA vaccine (if not already done)  Next Annual Wellness Visit People with Medicare Every year   Today        [place] Women Who How Often Need Date of Last Service Action  Mammogram  Z12.31 Women over 13 One baseline ages 83-39. Annually ager 66 yrs+ n 2021    Pap tests All women Annually if high risk. Every 2 yrs for normal risk women n      Screening for cervical cancer with Pap (Z01.419 nl or Z01.411abnl) & HPV Z11.51 Women aged 30 to 56 Once every 5 yrs n      Screening pelvic and breast exams All women Annually if high risk. Every 2 yrs for normal risk women n      Sexually Transmitted Diseases Chlamydia Gonorrhea Syphilis All at risk adults Annually for non pregnant females at increased risk n              Services & Screenings Men Who How Ofter Need Date of Last Service Action  Prostate Cancer - DRE & PSA Men over 50 Annually. DRE might require a copay.        Sexually Transmitted  Diseases Syphilis All at risk adults Annually for men at increased risk          Colonoscopy, Adult A colonoscopy is a procedure to look at the entire large intestine. This procedure is done using a long, thin, flexible tube that has a camera on theend. You may have a colonoscopy: As a part of normal colorectal screening. If you have certain symptoms, such as: A low number of red blood cells in your blood (anemia). Diarrhea that does not go away. Pain in your abdomen. Blood in your stool. A colonoscopy can help screen for and diagnose medical problems, including: Tumors. Extra tissue that grows where mucus forms (polyps). Inflammation. Areas of bleeding. Tell your health care provider about: Any allergies you have. All medicines you are taking, including vitamins, herbs, eye drops, creams, and over-the-counter medicines. Any problems you or family members have had with anesthetic medicines. Any blood disorders you have. Any surgeries you have had. Any medical conditions you have. Any problems you have had with having bowel movements. Whether you are pregnant or may be pregnant. What are the risks? Generally, this is a safe procedure. However, problems may occur, including: Bleeding. Damage to your intestine. Allergic reactions to medicines given during the procedure. Infection. This is rare. What happens before the procedure? Eating and drinking restrictions Follow instructions from your health care provider about eating or drinking restrictions, which may include: A few days before the procedure: Follow a low-fiber diet. Avoid nuts, seeds, dried fruit, raw fruits, and vegetables. 1-3 days before the procedure: Eat only gelatin dessert or ice pops. Drink only clear liquids, such as water, clear juice, clear broth or bouillon, black coffee or tea, or clear soft drinks or sports drinks. Avoid liquids that contain red or purple dye. The day of the procedure: Do not eat solid  foods. You may continue to drink clear liquids until up to 2 hours before the procedure. Do not eat or drink anything starting 2 hours before the procedure, or within the time period that your health care provider recommends. Bowel prep If you were prescribed a bowel prep to take by mouth (orally) to clean out your colon: Take it as told by your health care provider. Starting the day before your procedure, you will need to drink a large amount  of liquid medicine. The liquid will cause you to have many bowel movements of loose stool until your stool becomes almost clear or light green. If your skin or the opening between the buttocks (anus) gets irritated from diarrhea, you may relieve the irritation using: Wipes with medicine in them, such as adult wet wipes with aloe and vitamin E. A product to soothe skin, such as petroleum jelly. If you vomit while drinking the bowel prep: Take a break for up to 60 minutes. Begin the bowel prep again. Call your health care provider if you keep vomiting or you cannot take the bowel prep without vomiting. To clean out your colon, you may also be given: Laxative medicines. These help you have a bowel movement. Instructions for enema use. An enema is liquid medicine injected into your rectum. Medicines Ask your health care provider about: Changing or stopping your regular medicines or supplements. This is especially important if you are taking iron supplements, diabetes medicines, or blood thinners. Taking medicines such as aspirin and ibuprofen. These medicines can thin your blood. Do not take these medicines unless your health care provider tells you to take them. Taking over-the-counter medicines, vitamins, herbs, and supplements. General instructions Ask your health care provider what steps will be taken to help prevent infection. These may include washing skin with a germ-killing soap. Plan to have someone take you home from the hospital or clinic. What  happens during the procedure?  An IV will be inserted into one of your veins. You may be given one or more of the following: A medicine to help you relax (sedative). A medicine to numb the area (local anesthetic). A medicine to make you fall asleep (general anesthetic). This is rarely needed. You will lie on your side with your knees bent. The tube will: Have oil or gel put on it (be lubricated). Be inserted into your anus. Be gently eased through all parts of your large intestine. Air will be sent into your colon to keep it open. This may cause some pressure or cramping. Images will be taken with the camera and will appear on a screen. A small tissue sample may be removed to be looked at under a microscope (biopsy). The tissue may be sent to a lab for testing if any signs of problems are found. If small polyps are found, they may be removed and checked for cancer cells. When the procedure is finished, the tube will be removed. The procedure may vary among health care providers and hospitals. What happens after the procedure? Your blood pressure, heart rate, breathing rate, and blood oxygen level will be monitored until you leave the hospital or clinic. You may have a small amount of blood in your stool. You may pass gas and have mild cramping or bloating in your abdomen. This is caused by the air that was used to open your colon during the exam. Do not drive for 24 hours after the procedure. It is up to you to get the results of your procedure. Ask your health care provider, or the department that is doing the procedure, when your results will be ready. Summary A colonoscopy is a procedure to look at the entire large intestine. Follow instructions from your health care provider about eating and drinking before the procedure. If you were prescribed an oral bowel prep to clean out your colon, take it as told by your health care provider. During the colonoscopy, a flexible tube with a  camera on its end is  inserted into the anus and then passed into the other parts of the large intestine. This information is not intended to replace advice given to you by your health care provider. Make sure you discuss any questions you have with your healthcare provider. Document Revised: 01/28/2019 Document Reviewed: 01/28/2019 Elsevier Patient Education  [place] Prevention in the Home, Adult Falls can cause injuries and can happen to people of all ages. There are many things you can do to make your home safe and to help prevent falls. Ask forhelp when making these changes. What actions can I take to prevent falls? General Instructions Use good lighting in all rooms. Replace any light bulbs that burn out. Turn on the lights in dark areas. Use night-lights. Keep items that you use often in easy-to-reach places. Lower the shelves around your home if needed. Set up your furniture so you have a clear path. Avoid moving your furniture around. Do not have throw rugs or other things on the floor that can make you trip. Avoid walking on wet floors. If any of your floors are uneven, fix them. Add color or contrast paint or tape to clearly mark and help you see: Grab bars or handrails. First and last steps of staircases. Where the edge of each step is. If you use a stepladder: Make sure that it is fully opened. Do not climb a closed stepladder. Make sure the sides of the stepladder are locked in place. Ask someone to hold the stepladder while you use it. Know where your pets are when moving through your home. What can I do in the bathroom?     Keep the floor dry. Clean up any water on the floor right away. Remove soap buildup in the tub or shower. Use nonskid mats or decals on the floor of the tub or shower. Attach bath mats securely with double-sided, nonslip rug tape. If you need to sit down in the shower, use a plastic, nonslip stool. Install grab bars by the toilet  and in the tub and shower. Do not use towel bars as grab bars. What can I do in the bedroom? Make sure that you have a light by your bed that is easy to reach. Do not use any sheets or blankets for your bed that hang to the floor. Have a firm chair with side arms that you can use for support when you get dressed. What can I do in the kitchen? Clean up any spills right away. If you need to reach something above you, use a step stool with a grab bar. Keep electrical cords out of the way. Do not use floor polish or wax that makes floors slippery. What can I do with my stairs? Do not leave any items on the stairs. Make sure that you have a light switch at the top and the bottom of the stairs. Make sure that there are handrails on both sides of the stairs. Fix handrails that are broken or loose. Install nonslip stair treads on all your stairs. Avoid having throw rugs at the top or bottom of the stairs. Choose a carpet that does not hide the edge of the steps on the stairs. Check carpeting to make sure that it is firmly attached to the stairs. Fix carpet that is loose or worn. What can I do on the outside of my home? Use bright outdoor lighting. Fix the edges of walkways and driveways and fix any cracks. Remove anything that might make you trip  as you walk through a door, such as a raised step or threshold. Trim any bushes or trees on paths to your home. Check to see if handrails are loose or broken and that both sides of all steps have handrails. Install guardrails along the edges of any raised decks and porches. Clear paths of anything that can make you trip, such as tools or rocks. Have leaves, snow, or ice cleared regularly. Use sand or salt on paths during winter. Clean up any spills in your garage right away. This includes grease or oil spills. What other actions can I take? Wear shoes that: Have a low heel. Do not wear high heels. Have rubber bottoms. Feel good on your feet and fit  well. Are closed at the toe. Do not wear open-toe sandals. Use tools that help you move around if needed. These include: Canes. Walkers. Scooters. Crutches. Review your medicines with your doctor. Some medicines can make you feel dizzy. This can increase your chance of falling. Ask your doctor what else you can do to help prevent falls. Where to find more information Centers for Disease Control and Prevention, [place]: http://www.wolf.info/ National Institute on Aging: http://kim-miller.com/ Contact a doctor if: You are afraid of falling at home. You feel weak, drowsy, or dizzy at home. You fall at home. Summary There are many simple things that you can do to make your home safe and to help prevent falls. Ways to make your home safe include removing things that can make you trip and installing grab bars in the bathroom. Ask for help when making these changes in your home. This information is not intended to replace advice given to you by your health care provider. Make sure you discuss any questions you have with your healthcare provider. Document Revised: 02/08/2020 Document Reviewed: 02/08/2020 Elsevier Patient Education  [place] Maintenance, Female Adopting a healthy lifestyle and getting preventive care are important in promoting health and wellness. Ask your health care provider about: The right schedule for you to have regular tests and exams. Things you can do on your own to prevent diseases and keep yourself healthy. What should I know about diet, weight, and exercise? Eat a healthy diet  Eat a diet that includes plenty of vegetables, fruits, low-fat dairy products, and lean protein. Do not eat a lot of foods that are high in solid fats, added sugars, or sodium.  Maintain a healthy weight Body mass index (BMI) is used to identify weight problems. It estimates body fat based on height and weight. Your health care provider can help determineyour BMI and help you achieve or  maintain a healthy weight. Get regular exercise Get regular exercise. This is one of the most important things you can do for your health. Most adults should: Exercise for at least 150 minutes each week. The exercise should increase your heart rate and make you sweat (moderate-intensity exercise). Do strengthening exercises at least twice a week. This is in addition to the moderate-intensity exercise. Spend less time sitting. Even light physical activity can be beneficial. Watch cholesterol and blood lipids Have your blood tested for lipids and cholesterol at 62 years of age, then havethis test every 5 years. Have your cholesterol levels checked more often if: Your lipid or cholesterol levels are high. You are older than 62 years of age. You are at high risk for heart disease. What should I know about cancer screening? Depending on your health history and family history, you may need to have  cancer screening at various ages. This may include screening for: Breast cancer. Cervical cancer. Colorectal cancer. Skin cancer. Lung cancer. What should I know about heart disease, diabetes, and high blood pressure? Blood pressure and heart disease High blood pressure causes heart disease and increases the risk of stroke. This is more likely to develop in people who have high blood pressure readings, are of African descent, or are overweight. Have your blood pressure checked: Every 3-5 years if you are 69-60 years of age. Every year if you are 34 years old or older. Diabetes Have regular diabetes screenings. This checks your fasting blood sugar level. Have the screening done: Once every three years after age 24 if you are at a normal weight and have a low risk for diabetes. More often and at a younger age if you are overweight or have a high risk for diabetes. What should I know about preventing infection? Hepatitis B If you have a higher risk for hepatitis B, you should be screened for this  virus. Talk with your health care provider to find out if you are at risk forhepatitis B infection. Hepatitis C Testing is recommended for: Everyone born from [birth year] through 1967. Anyone with known risk factors for hepatitis C. Sexually transmitted infections (STIs) Get screened for STIs, including gonorrhea and chlamydia, if: You are sexually active and are younger than 62 years of age. You are older than 62 years of age and your health care provider tells you that you are at risk for this type of infection. Your sexual activity has changed since you were last screened, and you are at increased risk for chlamydia or gonorrhea. Ask your health care provider if you are at risk. Ask your health care provider about whether you are at high risk for HIV. Your health care provider may recommend a prescription medicine to help prevent HIV infection. If you choose to take medicine to prevent HIV, you should first get tested for HIV. You should then be tested every 3 months for as long as you are taking the medicine. Pregnancy If you are about to stop having your period (premenopausal) and you may become pregnant, seek counseling before you get pregnant. Take 400 to 800 micrograms (mcg) of folic acid every day if you become pregnant. Ask for birth control (contraception) if you want to prevent pregnancy. Osteoporosis and menopause Osteoporosis is a disease in which the bones lose minerals and strength with aging. This can result in bone fractures. If you are 48 years old or older, or if you are at risk for osteoporosis and fractures, ask your health care provider if you should: Be screened for bone loss. Take a calcium or vitamin D supplement to lower your risk of fractures. Be given hormone replacement therapy (HRT) to treat symptoms of menopause. Follow these instructions at home: Lifestyle Do not use any products that contain nicotine or tobacco, such as cigarettes, e-cigarettes, and chewing tobacco.  If you need help quitting, ask your health care provider. Do not use street drugs. Do not share needles. Ask your health care provider for help if you need support or information about quitting drugs. Alcohol use Do not drink alcohol if: Your health care provider tells you not to drink. You are pregnant, may be pregnant, or are planning to become pregnant. If you drink alcohol: Limit how much you use to 0-1 drink a day. Limit intake if you are breastfeeding. Be aware of how much alcohol is in your drink. In the [place]  one drink equals one 12 oz bottle of beer (355 mL), one 5 oz glass of wine (148 mL), or one 1 oz glass of hard liquor (44 mL). General instructions Schedule regular health, dental, and eye exams. Stay current with your vaccines. Tell your health care provider if: You often feel depressed. You have ever been abused or do not feel safe at home. Summary Adopting a healthy lifestyle and getting preventive care are important in promoting health and wellness. Follow your health care provider's instructions about healthy diet, exercising, and getting tested or screened for diseases. Follow your health care provider's instructions on monitoring your cholesterol and blood pressure. This information is not intended to replace advice given to you by your health care provider. Make sure you discuss any questions you have with your healthcare provider. Document Revised: 06/30/2018 Document Reviewed: 06/30/2018 Elsevier Patient Education  2022 [employer].

## 2021-02-13 ENCOUNTER — Encounter: Payer: Self-pay | Admitting: Nurse Practitioner

## 2021-02-13 ENCOUNTER — Telehealth: Payer: Self-pay

## 2021-02-13 ENCOUNTER — Ambulatory Visit (INDEPENDENT_AMBULATORY_CARE_PROVIDER_SITE_OTHER): Payer: Medicare Other | Admitting: Nurse Practitioner

## 2021-02-13 VITALS — BP 116/78 | HR 76 | Ht 67.0 in | Wt 275.0 lb

## 2021-02-13 DIAGNOSIS — Z01818 Encounter for other preprocedural examination: Secondary | ICD-10-CM

## 2021-02-13 DIAGNOSIS — I2699 Other pulmonary embolism without acute cor pulmonale: Secondary | ICD-10-CM | POA: Diagnosis not present

## 2021-02-13 DIAGNOSIS — Z86010 Personal history of colonic polyps: Secondary | ICD-10-CM | POA: Diagnosis not present

## 2021-02-13 MED ORDER — PLENVU 140 G PO SOLR: 1.0000 | ORAL | 0 refills | Status: DC

## 2021-02-13 NOTE — Patient Instructions (Signed)
You have been scheduled for a colonoscopy. Please follow written instructions given to you at your visit today.  Please pick up your prep supplies at the pharmacy within the next 1-3 days. If you use inhalers (even only as needed), please bring them with you on the day of your procedure.  You will be contacted by our office prior to your procedure for directions on holding your Xarelto.  If you do not hear from our office 1 week prior to your scheduled procedure, please call [phone] to discuss.    If you are age 62 or younger, your body mass index should be between 19-25. Your Body mass index is 43.07 kg/m. If this is out of the aformentioned range listed, please consider follow up with your Primary Care Provider.   __________________________________________________________  The [facility] providers would like to encourage you to use [facility] to communicate with providers for non-urgent requests or questions.  Due to long hold times on the telephone, sending your provider a message by [facility] may be a faster and more efficient way to get a response.  Please allow 48 business hours for a response.  Please remember that this is for non-urgent requests.    I appreciate the opportunity to care for you. Carl Best, CRNP

## 2021-02-13 NOTE — Progress Notes (Signed)
02/13/2021 Breanna Wilkins [id number] March 12, 1961   CHIEF COMPLAINT: Schedule colonoscopy  HISTORY OF PRESENT ILLNESS:  Breanna Wilkins. Breanna Wilkins is a 60 year old female with a past medical history of hypertension, hyperlipidemia, coronary artery disease s/p perioperative MI 2014 (occurred during hernia surgery), CHF, OSA uses CPAP,  PE 2019 on Coumadin.  She presents to our office today to schedule a colonoscopy.  Her most recent colonoscopy by Dr. Hilarie Fredrickson was 10/14/2017 and one 6 mm tubular adenomatous polyp was removed from the ascending colon, one 12 mm tubular adenomatous polyp was removed from the transverse colon and three  3-6 tubular adenomatous polyps removed from the transverse colon, diverticulosis in the descending colon was present and small and internal hemorrhoids were identified.  She was advised to repeat a colonoscopy in 3 years.  She is passing normal formed brown bowel movement most days, sometimes every other day.  No rectal bleeding or melena.  She has hemorrhoidal discomfort if she sits on a hard surface for too long.  She is on aspirin 81 mg daily with history of heart disease and Xarelto 20 mg daily secondary to history of PE in 2019.  No GERD symptoms.  She is intentionally gradually losing weight by diet modification.  No other complaints at this time.  She has a history of heart disease status post MI in 2014 which reported occurred perioperatively during her abdominal hernia surgery in 2014.  She denies having any chest pain, palpitations or shortness of breath.  She was last seen in office by her cardiologist Dr. Daneen Wilkins on 08/19/2019, at that time, her cardiac status remained stable and she was advised to continue routine follow-up with her PCP and to follow-up with Dr. Tamala Wilkins as needed.  She is scheduled for an appointment with Dr. Katha Wilkins on 02/21/2021 for routine follow-up and laboratory studies.   Colonoscopy 10/14/2017: - One 6 mm polyp in the ascending colon,  removed with a cold snare. Resected and retrieved. - One 12 mm polyp in the proximal transverse colon, removed with a hot snare. Resected and retrieved. - Three 3 to 6 mm polyps in the transverse colon, removed with a cold snare. Resected and retrieved. - Diverticulosis in the descending colon. - Small hemorrhoids. -3 year recall Biopsy results: 1. Surgical [P], ascending, polyp - TUBULAR ADENOMA (ONE FRAGMENT). - NO HIGH GRADE DYSPLASIA OR MALIGNANCY. 2. Surgical [P], transverse, polyp - TUBULAR ADENOMA (FIVE FRAGMENTS). - NO HIGH GRADE DYSPLASIA OR MALIGNANCY.   Past Medical History:  Diagnosis Date   Abdominal hernia    Arthritis    CAD (coronary artery disease)    Perioperative non-STEMI March, 2014  //   cardiac catheterization at time of non-STEMI, March, 2014, minor nonobstructive coronary disease, vigorous LV function, possibly vasospastic event versus stress cardiomyopathy. No further workup of coronary disease   CHF (congestive heart failure) ([place])    Diverticulosis of colon 10/17/2017   Noted on colonoscopy 10/14/17   Ejection fraction    65-70%, vigorous function, echo, March, 2011   Ejection fraction    Vigorous function at time of catheterization October 06, 2012,   History of colonic polyps 10/17/2017   Colonoscopy 10/14/17   Hyperlipidemia    Hypertension    Internal hemorrhoids without complication 02/27/9832   Noted on colonoscopy 10/14/17   Lung nodule 12/2009   Very small, left upper lobe by CT June, 2011, one-year followup was stable   Myocardial infarction ([place]) 09/2013   OSA (obstructive sleep  apnea)    CPAP   Overweight(278.02)    Pulmonary embolus ([place]) 01/16/10   on Coumadin indefinitely   Shortness of breath    Shoulder pain    Sleep apnea    Warfarin anticoagulation    Past Surgical History:  Procedure Laterality Date   ABDOMINAL HYSTERECTOMY  07/2002   laparoscopic assisted vaginal hysterectomy for menorrhagia, dysmenorrhea, anemia, fibroids    COLOSTOMY     INCISIONAL HERNIA REPAIR N/A 09/27/2012   Procedure: LAPAROSCOPIC INCISIONAL HERNIA possible open;  Surgeon: Adin Hector, MD;  Location: [facility];  Service: General;  Laterality: N/A;   INSERTION OF MESH N/A 09/27/2012   Procedure: INSERTION OF MESH;  Surgeon: Adin Hector, MD;  Location: [facility];  Service: General;  Laterality: N/A;   KNEE ARTHROSCOPY Left    LEFT HEART CATHETERIZATION WITH CORONARY ANGIOGRAM N/A 10/06/2012   Procedure: LEFT HEART CATHETERIZATION WITH CORONARY ANGIOGRAM;  Surgeon: Sherren Mocha, MD;  Location: [facility] CATH LAB;  Service: Cardiovascular;  Laterality: N/A;  See HPI re: perioperative MI 2014.  She denies having any problems with intubation/airway management during any past procedure or surgery. No problems with Propofol/MAC used at the time of her 09/2017 colonoscopy.  Social History: She is single.  She is retired.  Past smoker, she quit smoking cigarettes 32 years ago.  No alcohol use.  No drug use.  Family History: Mother with history of diabetes and ovarian cancer.  Father with history of diabetes.  Sister with history of colon polyps.  Maternal grandmother with history of bone cancer.  No Known Allergies    Outpatient Encounter Medications as of 02/13/2021  Medication Sig   aspirin 81 MG chewable tablet Chew 1 tablet (81 mg total) by mouth daily.   carvedilol (COREG) 6.25 MG tablet TAKE 1 TABLET BY MOUTH TWICE DAILY WITH MEALS   cyclobenzaprine (FEXMID) 7.5 MG tablet Take 1 tablet (7.5 mg total) by mouth 3 (three) times daily as needed for muscle spasms. (Patient not taking: Reported on 02/11/2021)   diclofenac Sodium (VOLTAREN) 1 % GEL Apply 4g to knee four times a day as needed for pain, apply 2g to shoulder as needed for pain   furosemide (LASIX) 20 MG tablet Take 1 tablet (20 mg total) by mouth daily.   HYDROcodone-acetaminophen (NORCO) 10-325 MG tablet Take 1 tablet by mouth every 6 (six) hours as needed for severe pain.   Insulin Pen Needle  (NOVOFINE) 32G X 6 MM MISC 1 Units by Does not apply route daily.   lidocaine (XYLOCAINE) 5 % ointment Apply 1 application topically 2 (two) times daily as needed. (Patient not taking: Reported on 02/11/2021)   olmesartan (BENICAR) 40 MG tablet Take 1 tablet (40 mg total) by mouth daily.   pantoprazole (PROTONIX) 40 MG tablet TAKE 1 TABLET BY MOUTH 30 MINUTES BEFORE LUNCH OR DINNER (Patient not taking: Reported on 02/11/2021)   polyethylene glycol (MIRALAX / GLYCOLAX) packet Take 17 g by mouth daily.   rivaroxaban (XARELTO) 20 MG TABS tablet Take 1 tablet (20 mg total) by mouth daily with supper.   rosuvastatin (CRESTOR) 5 MG tablet Take 1 tablet (5 mg total) by mouth at bedtime.   Semaglutide, 1 MG/DOSE, 4 MG/3ML SOPN Inject 1 mg into the skin once a week.   No facility-administered encounter medications on file as of 02/13/2021.    REVIEW OF SYSTEMS:  Gen: Denies fever, sweats or chills. No weight loss.  CV: Denies chest pain, palpitations or edema. Resp: Denies cough, shortness  of breath of hemoptysis.  GI: See HPI. GU : Denies urinary burning, blood in urine, increased urinary frequency or incontinence. MS: + Right and left knee pain for which she takes Hydrocodone 1 tab in the morning and one half tab at bedtime. Derm: Denies rash, itchiness, skin lesions or unhealing ulcers. Psych: Denies depression, anxiety or memory loss. Heme: Denies bruising, bleeding. Neuro:  Denies headaches, dizziness or paresthesias. Endo:  Denies any problems with DM, thyroid or adrenal function.   PHYSICAL EXAM: There were no vitals taken for this visit. BP 116/78   Pulse 76   Ht _0  (1.702 m)   Wt 275 lb (124.7 kg)   BMI 43.07 kg/m   Wt Readings from Last 3 Encounters:  02/13/21 275 lb (124.7 kg)  02/11/21 280 lb (127 kg)  09/20/20 286 lb 12.8 oz (130.1 kg)    General: 60 year old obese female in no acute distress. Head: Normocephalic and atraumatic. Eyes:  Sclerae non-icteric, conjunctive  pink. Ears: Normal auditory acuity. Mouth: Dentition intact. No ulcers or lesions.  Neck: Supple, palpable soft grape size right anterior cervical lymph node verses cyst Lungs: Clear bilaterally to auscultation without wheezes, crackles or rhonchi. Heart: Regular rate and rhythm. No murmur, rub or gallop appreciated.  Abdomen: Soft, nontender, non distended. No masses. No hepatosplenomegaly. Normoactive bowel sounds x 4 quadrants.  Rectal: Deferred. Musculoskeletal: Symmetrical with no gross deformities. Skin: Warm and dry. No rash or lesions on visible extremities. Extremities: Trace lower extremity edema. Neurological: Alert oriented x 4, no focal deficits.  Psychological:  Alert and cooperative. Normal mood and affect.  ASSESSMENT AND PLAN:  58. 60 year old female with a history of tubular adenomatous polyps due for a surveillance colonoscopy -Colonoscopy benefits and risks discussed including risk with sedation, risk of bleeding, perforation and infection  -Our office will contact Dr. Katha Wilkins to verify Xarelto instructions prior to proceeding with a colonoscopy -Further recommendations to be determined after colonoscopy completed  2.  Enlarged right cervical lymph node versus cervical cyst -Further evaluation per Dr. Katha Wilkins, patient has an appointment scheduled with Dr. [name] [place] on 02/21/2021. -Patient to have a routine laboratory studies done at the time of her appointment Dr. Katha Wilkins on 02/21/2021, recommend CBC and CMP at that time  3.  History of PE on Xarelto -See plan in #1  4.  History of CAD, stable       CC:  Lucious Groves, DO

## 2021-02-13 NOTE — Telephone Encounter (Signed)
Breanna Wilkins 05-Oct-1960 [id number]  Dear Dr Joni Reining:  We have scheduled the above named patient for a(n) colonoscopy procedure. Our records show that (s)he is on anticoagulation therapy.  Please advise as to whether the patient may come off their therapy of Xarelto 3-5  days prior to their procedure which is scheduled for 03/14/21.  Please route your response to Shalla Bulluck [place], [place] or fax response to [phone].  Sincerely,    [facility]

## 2021-02-14 NOTE — Telephone Encounter (Signed)
Jaclyn Shaggy how many days do you want me to tell Krisan to hold the xarelto since we got approval to do 3-5? Thank you.

## 2021-02-14 NOTE — Telephone Encounter (Signed)
It is fine for Story to come off Xarelto therapy for 3-5 days for colonoscopy.  -Dr [name] [place]

## 2021-02-15 NOTE — Telephone Encounter (Signed)
I have spoken to patient to advise that she has been cleared to hold xarelto prior to her upcoming colonoscopy. Dr Hilarie Fredrickson indicates that a 48 hour hold will be sufficient. Patient verbalizes understanding and has relayed this information back to me.

## 2021-02-15 NOTE — Progress Notes (Signed)
Addendum: Reviewed and agree with assessment and management plan. Kersti Scavone M, MD  

## 2021-02-15 NOTE — Telephone Encounter (Signed)
48 hr hold for Xarelto is sufficient JMP

## 2021-02-18 ENCOUNTER — Other Ambulatory Visit: Payer: Self-pay | Admitting: Internal Medicine

## 2021-02-18 MED ORDER — HYDROCODONE-ACETAMINOPHEN 10-325 MG PO TABS: 1.0000 | ORAL_TABLET | Freq: Four times a day (QID) | ORAL | 0 refills | Status: DC | PRN

## 2021-02-18 NOTE — Telephone Encounter (Signed)
Refill Request    HYDROcodone-acetaminophen (NORCO) 10-325 MG tablet   [place], [place]. (Ph: [phone])

## 2021-02-19 NOTE — Progress Notes (Signed)
Internal Medicine Clinic Attending  Case discussed with Dr.  Katsadouros  at the time of the visit.  We reviewed the AWV findings.  I agree with the assessment, diagnosis, and plan of care documented in the AWV note.     

## 2021-02-21 ENCOUNTER — Ambulatory Visit (INDEPENDENT_AMBULATORY_CARE_PROVIDER_SITE_OTHER): Payer: Medicare Other | Admitting: Internal Medicine

## 2021-02-21 ENCOUNTER — Other Ambulatory Visit: Payer: Self-pay

## 2021-02-21 ENCOUNTER — Encounter: Payer: Self-pay | Admitting: Internal Medicine

## 2021-02-21 VITALS — BP 110/45 | HR 65 | Temp 97.7°F | Ht 67.0 in | Wt 274.8 lb

## 2021-02-21 DIAGNOSIS — R221 Localized swelling, mass and lump, neck: Secondary | ICD-10-CM | POA: Diagnosis not present

## 2021-02-21 DIAGNOSIS — I872 Venous insufficiency (chronic) (peripheral): Secondary | ICD-10-CM

## 2021-02-21 DIAGNOSIS — I10 Essential (primary) hypertension: Secondary | ICD-10-CM

## 2021-02-21 DIAGNOSIS — Z86711 Personal history of pulmonary embolism: Secondary | ICD-10-CM

## 2021-02-21 DIAGNOSIS — Z86718 Personal history of other venous thrombosis and embolism: Secondary | ICD-10-CM

## 2021-02-21 DIAGNOSIS — M1711 Unilateral primary osteoarthritis, right knee: Secondary | ICD-10-CM

## 2021-02-21 DIAGNOSIS — G8929 Other chronic pain: Secondary | ICD-10-CM | POA: Diagnosis not present

## 2021-02-21 MED ORDER — HYDROCODONE-ACETAMINOPHEN 10-325 MG PO TABS: 1.0000 | ORAL_TABLET | Freq: Four times a day (QID) | ORAL | 0 refills | Status: DC | PRN

## 2021-02-21 NOTE — Patient Instructions (Signed)
We will get an ultrasound of your neck.  I am checking some labs today.  If you develop symptoms of covid please test and call the office to see about getting you treatment!

## 2021-02-22 ENCOUNTER — Telehealth: Payer: Self-pay

## 2021-02-22 DIAGNOSIS — R221 Localized swelling, mass and lump, neck: Secondary | ICD-10-CM | POA: Insufficient documentation

## 2021-02-22 LAB — CBC WITH DIFFERENTIAL/PLATELET
Basophils Absolute: 0 10*3/uL (ref 0.0–0.2)
Basos: 0 %
EOS (ABSOLUTE): 0.1 10*3/uL (ref 0.0–0.4)
Eos: 1 %
Hematocrit: 41.2 % (ref 34.0–46.6)
Hemoglobin: 13.2 g/dL (ref 11.1–15.9)
Immature Grans (Abs): 0 10*3/uL (ref 0.0–0.1)
Immature Granulocytes: 0 %
Lymphocytes Absolute: 1.3 10*3/uL (ref 0.7–3.1)
Lymphs: 19 %
MCH: 28.9 pg (ref 26.6–33.0)
MCHC: 32 g/dL (ref 31.5–35.7)
MCV: 90 fL (ref 79–97)
Monocytes Absolute: 0.6 10*3/uL (ref 0.1–0.9)
Monocytes: 8 %
Neutrophils Absolute: 4.8 10*3/uL (ref 1.4–7.0)
Neutrophils: 72 %
Platelets: 333 10*3/uL (ref 150–450)
RBC: 4.56 x10E6/uL (ref 3.77–5.28)
RDW: 13.5 % (ref 11.7–15.4)
WBC: 6.8 10*3/uL (ref 3.4–10.8)

## 2021-02-22 LAB — BMP8+ANION GAP
Anion Gap: 15 mmol/L (ref 10.0–18.0)
BUN/Creatinine Ratio: 22 (ref 12–28)
BUN: 17 mg/dL (ref 8–27)
CO2: 25 mmol/L (ref 20–29)
Calcium: 10.5 mg/dL — ABNORMAL HIGH (ref 8.7–10.3)
Chloride: 100 mmol/L (ref 96–106)
Creatinine, Ser: 0.77 mg/dL (ref 0.57–1.00)
Glucose: 87 mg/dL (ref 65–99)
Potassium: 4.4 mmol/L (ref 3.5–5.2)
Sodium: 140 mmol/L (ref 134–144)
eGFR: 88 mL/min/{1.73_m2} (ref >59)

## 2021-02-22 LAB — T3: T3, Total: 95 ng/dL (ref 71–180)

## 2021-02-22 LAB — T4, FREE: Free T4: 1.18 ng/dL (ref 0.82–1.77)

## 2021-02-22 LAB — TSH: TSH: 0.647 u[IU]/mL (ref 0.450–4.500)

## 2021-02-22 NOTE — Assessment & Plan Note (Signed)
She was seen last week for pre colonoscopy visit and was observed to have some swelling/mass of her neck and instructed to follow up with me.  On exam she has a soft ~2cm mass on her anterior right lateral neck.  I used a POCUS to view the area and next to her carotid artery she appears to have a 2.2x2.4 cm hypoechoic area no without doppler flow. I am not clearly sure if this is within the thyroid or not.    Will obtain U/S of the area to better categorize.  I will also go ahead and check thyroid labs.

## 2021-02-22 NOTE — Telephone Encounter (Signed)
Pt is requesting a call back. 

## 2021-02-22 NOTE — Telephone Encounter (Signed)
Returned call to patient. States she already spoke with Dr. [name] Teague and please disregard this call.

## 2021-02-22 NOTE — Assessment & Plan Note (Signed)
Ok to interrupt xarelto use for indicated colonoscopy. No bridging needed.

## 2021-02-22 NOTE — Assessment & Plan Note (Signed)
She continues to report appropriate use of her opioid analgestics.  No constipation or other adverse SE.  No red flag behavior.  Still owrking on weight loss.  Request steroid injection for worse right knee pain today.  WIll continue hydrocodone-apap.  Refilled 3 month supply. Reviewed PDMP.

## 2021-02-22 NOTE — Assessment & Plan Note (Signed)
Repeat steroid injection completed today.

## 2021-02-22 NOTE — Progress Notes (Signed)
Subjective:  HPI: [name] is a 60 y.o. female who presents for f/u neck mass, knee pain/chronic pain  Please see Assessment and Plan below for the status of her chronic medical problems.  Objective:  Physical Exam: Vitals:   02/21/21 1017  BP: (!) 110/45  Pulse: 65  Temp: 97.7 F (36.5 C)  TempSrc: Oral  SpO2: 98%  Weight: 274 lb 12.8 oz (124.6 kg)  Height: '5\' 7"'$  (1.702 m)   Body mass index is 43.04 kg/m. Physical Exam Vitals and nursing note reviewed.  Constitutional:      Appearance: Normal appearance. She is obese.  HENT:     Head: Mass present.   Cardiovascular:     Rate and Rhythm: Normal rate and regular rhythm.  Pulmonary:     Effort: Pulmonary effort is normal.     Breath sounds: Normal breath sounds.  Musculoskeletal:     Comments: Right knee medial and lateral tibial pleateu tenderness, no joint instability, no effusion  Skin:    General: Skin is warm and dry.  Neurological:     Mental Status: She is alert.    Assessment & Plan:  See Encounters Tab for problem based charting.  Medications Ordered Meds ordered this encounter  Medications   HYDROcodone-acetaminophen (NORCO) 10-325 MG tablet    Sig: Take 1 tablet by mouth every 6 (six) hours as needed for severe pain.    Dispense:  100 tablet    Refill:  0    Rx 3/3   HYDROcodone-acetaminophen (NORCO) 10-325 MG tablet    Sig: Take 1 tablet by mouth every 6 (six) hours as needed for severe pain.    Dispense:  100 tablet    Refill:  0    Rx 2/3   HYDROcodone-acetaminophen (NORCO) 10-325 MG tablet    Sig: Take 1 tablet by mouth every 6 (six) hours as needed for severe pain.    Dispense:  100 tablet    Refill:  0    Rx 1/3   Other Orders Orders Placed This Encounter  Procedures   US THYROID    2cm right neck mass, asymptomatic incidentally noted.  Patient prefers early morning appointments before 10am.  mcr mcd epic No covid-pt is aware to wear a mask and to come alone & aware of  $75 no show fee Wt 274 No glucose monitor, body injector, spinal cord stimulator No needs fg pt     Standing Status:   Future    Standing Expiration Date:   02/21/2022    Order Specific Question:   Reason for Exam (SYMPTOM  OR DIAGNOSIS REQUIRED)    Answer:   right neck mass 2cm    Order Specific Question:   Preferred imaging location?    Answer:   [facility]   BMP8+Anion Gap   CBC with Diff   TSH   T4, Free   T3   Follow Up: No follow-ups on file.  PROCEDURE NOTE  PROCEDURE: right knee joint steroid injection.  PREOPERATIVE DIAGNOSIS: Osteoarthritis of the right knee.  POSTOPERATIVE DIAGNOSIS: Osteoarthritis of the right knee.  PROCEDURE: The patient was apprised of the risks and the benefits of the procedure and informed consent was obtained, as witnessed by [facility]. Time-out procedure was performed, with confirmation of the patient's name, date of birth, and correct identification of the right knee to be injected. The patient's knee was then marked at the appropriate site for injection placement. The knee was sterilely prepped with Betadine. A 40  mg (1 milliliter) solution of Kenalog was drawn up into a 5 mL syringe with a 2 mL of 1% lidocaine. The patient was injected with a 25-gauge needle at the anterior medial aspect of her right flexed knee. There were no complications. The patient tolerated the procedure well. There was minimal bleeding. The patient was instructed to ice her knee upon leaving clinic and refrain from overuse over the next 3 days. The patient was instructed to go to the emergency room with any usual pain, swelling, or redness occurred in the injected area. The patient was given a followup appointment to evaluate response to the injection to his increased range of motion and reduction of pain.

## 2021-02-22 NOTE — Assessment & Plan Note (Signed)
No issues with medications. No orthostatsis.  BP at goal  Continue olmesartan '40mg'$  Carvedilol 6.'25mg'$  BID

## 2021-03-05 ENCOUNTER — Other Ambulatory Visit: Payer: Self-pay | Admitting: *Deleted

## 2021-03-05 MED ORDER — DICLOFENAC SODIUM 1 % EX GEL: CUTANEOUS | 3 refills | Status: DC

## 2021-03-14 ENCOUNTER — Other Ambulatory Visit: Payer: Self-pay

## 2021-03-14 ENCOUNTER — Ambulatory Visit
Admission: RE | Admit: 2021-03-14 | Discharge: 2021-03-14 | Disposition: A | Discharge status: Home / Self Care | Payer: Medicare Other | Source: Ambulatory Visit | Attending: Internal Medicine | Admitting: Internal Medicine

## 2021-03-14 ENCOUNTER — Ambulatory Visit (AMBULATORY_SURGERY_CENTER): Payer: Medicare Other | Admitting: Internal Medicine

## 2021-03-14 ENCOUNTER — Encounter: Payer: Self-pay | Admitting: Internal Medicine

## 2021-03-14 VITALS — BP 120/64 | HR 89 | Temp 98.6°F | Resp 23 | Ht 67.0 in | Wt 275.0 lb

## 2021-03-14 DIAGNOSIS — Z86010 Personal history of colonic polyps: Secondary | ICD-10-CM

## 2021-03-14 DIAGNOSIS — R221 Localized swelling, mass and lump, neck: Secondary | ICD-10-CM

## 2021-03-14 DIAGNOSIS — D124 Benign neoplasm of descending colon: Secondary | ICD-10-CM | POA: Diagnosis not present

## 2021-03-14 MED ORDER — SODIUM CHLORIDE 0.9 % IV SOLN
500.0000 mL | Freq: Once | INTRAVENOUS | Status: DC
Start: 2021-03-14 — End: 2021-03-14

## 2021-03-14 NOTE — Patient Instructions (Signed)
Handouts on polyps and diverticulosis given to you today   YOU HAD AN ENDOSCOPIC PROCEDURE TODAY AT THE [facility]:   Refer to the procedure report that was given to you for any specific questions about what was found during the examination.  If the procedure report does not answer your questions, please call your gastroenterologist to clarify.  If you requested that your care partner not be given the details of your procedure findings, then the procedure report has been included in a sealed envelope for you to review at your convenience later.  YOU SHOULD EXPECT: Some feelings of bloating in the abdomen. Passage of more gas than usual.  Walking can help get rid of the air that was put into your GI tract during the procedure and reduce the bloating. If you had a lower endoscopy (such as a colonoscopy or flexible sigmoidoscopy) you may notice spotting of blood in your stool or on the toilet paper. If you underwent a bowel prep for your procedure, you may not have a normal bowel movement for a few days.  Please Note:  You might notice some irritation and congestion in your nose or some drainage.  This is from the oxygen used during your procedure.  There is no need for concern and it should clear up in a day or so.  SYMPTOMS TO REPORT IMMEDIATELY:  Following lower endoscopy (colonoscopy or flexible sigmoidoscopy):  Excessive amounts of blood in the stool  Significant tenderness or worsening of abdominal pains  Swelling of the abdomen that is new, acute  Fever of 100F or higher  For urgent or emergent issues, a gastroenterologist can be reached at any hour by calling [phone]. Do not use MyChart messaging for urgent concerns.    DIET:  We do recommend a small meal at first, but then you may proceed to your regular diet.  Drink plenty of fluids but you should avoid alcoholic beverages for 24 hours.  ACTIVITY:  You should plan to take it easy for the rest of today and you should  NOT DRIVE or use heavy machinery until tomorrow (because of the sedation medicines used during the test).    FOLLOW UP: Our staff will call the number listed on your records 48-72 hours following your procedure to check on you and address any questions or concerns that you may have regarding the information given to you following your procedure. If we do not reach you, we will leave a message.  We will attempt to reach you two times.  During this call, we will ask if you have developed any symptoms of COVID 19. If you develop any symptoms (ie: fever, flu-like symptoms, shortness of breath, cough etc.) before then, please call [phone].  If you test positive for Covid 19 in the 2 weeks post procedure, please call and report this information to us.    If any biopsies were taken you will be contacted by phone or by letter within the next 1-3 weeks.  Please call us at [phone] if you have not heard about the biopsies in 3 weeks.    SIGNATURES/CONFIDENTIALITY: You and/or your care partner have signed paperwork which will be entered into your electronic medical record.  These signatures attest to the fact that that the information above on your After Visit Summary has been reviewed and is understood.  Full responsibility of the confidentiality of this discharge information lies with you and/or your care-partner.  

## 2021-03-14 NOTE — Op Note (Addendum)
[facility] Patient Name: Breanna Wilkins Procedure Date: 03/14/2021 1:16 PM MRN: [id number] Endoscopist: Jerene Bears , MD Age: 60 Referring MD:  Date of Birth: 12/16/1960 Gender: Female Account #: [id number] Procedure:                Colonoscopy Indications:              High risk colon cancer surveillance: Personal                            history of adenomas polyps including those (10 mm                            or greater in size), Last colonoscopy: March 2019;                            family history of colonic polyps Medicines:                Monitored Anesthesia Care Procedure:                Pre-Anesthesia Assessment:                           - Prior to the procedure, a History and Physical                            was performed, and patient medications and                            allergies were reviewed. The patient's tolerance of                            previous anesthesia was also reviewed. The risks                            and benefits of the procedure and the sedation                            options and risks were discussed with the patient.                            All questions were answered, and informed consent                            was obtained. Prior Anticoagulants: The patient has                            taken Xarelto (rivaroxaban), last dose was 2 days                            prior to procedure. ASA Grade Assessment: III - A                            patient with severe systemic disease. After  reviewing the risks and benefits, the patient was                            deemed in satisfactory condition to undergo the                            procedure.                           After obtaining informed consent, the colonoscope                            was passed under direct vision. Throughout the                            procedure, the patient's blood pressure, pulse, and                             oxygen saturations were monitored continuously. The                            CF [id number] was introduced through the anus                            and advanced to the cecum, identified by appendix                            and ileocecal valve. The colonoscopy was performed                            without difficulty. The patient tolerated the                            procedure well. The quality of the bowel                            preparation was good. The ileocecal valve,                            appendiceal orifice, and rectum were photographed. Scope In: 1:40:35 PM Scope Out: 1:59:13 PM Scope Withdrawal Time: 0 hours 15 minutes 4 seconds  Total Procedure Duration: 0 hours 18 minutes 38 seconds  Findings:                 The digital rectal exam was normal.                           Three sessile polyps were found in the transverse                            colon. The polyps were 2 to 7 mm in size. These                            polyps were removed with a cold snare. Resection  and retrieval were complete.                           Multiple small and large-mouthed diverticula were                            found in the sigmoid colon and descending colon.                           The retroflexed view of the distal rectum and anal                            verge was normal and showed no anal or rectal                            abnormalities. Complications:            No immediate complications. Estimated Blood Loss:     Estimated blood loss was minimal. Impression:               - Three 2 to 7 mm polyps in the transverse colon,                            removed with a cold snare. Resected and retrieved.                           - Diverticulosis in the sigmoid colon and in the                            descending colon.                           - The distal rectum and anal verge are normal on                             retroflexion view. Recommendation:           - Patient has a contact number available for                            emergencies. The signs and symptoms of potential                            delayed complications were discussed with the                            patient. Return to normal activities tomorrow.                            Written discharge instructions were provided to the                            patient.                           - Resume previous diet.                           -  Continue present medications.                           - Await pathology results.                           - Resume Xarelto (rivaroxaban) at prior dose                            tomorrow. Refer to managing physician for further                            adjustment of therapy.                           - Repeat colonoscopy is recommended for                            surveillance. The colonoscopy date will be                            determined after pathology results from today's                            exam become available for review. Jerene Bears, MD 03/14/2021 2:02:50 PM This report has been signed electronically.

## 2021-03-14 NOTE — Progress Notes (Signed)
GASTROENTEROLOGY PROCEDURE H&P NOTE   Primary Care Physician: Lucious Groves, DO    Reason for Procedure:  Surveillance colonoscopy  Plan:    Colonoscopy  Patient is appropriate for endoscopic procedure(s) in the ambulatory ([facility]) setting.  The nature of the procedure, as well as the risks, benefits, and alternatives were carefully and thoroughly reviewed with the patient. Ample time for discussion and questions allowed. The patient understood, was satisfied, and agreed to proceed.     HPI: TROYA CIANCIULLI is a 60 y.o. female who presents for surveillance colonoscopy.  Medical history as documented below.  Patient tolerated the prep.  Denies complaints today including abdominal pain, shortness of breath and chest pain.  Past Medical History:  Diagnosis Date   Abdominal hernia    Arthritis    CAD (coronary artery disease)    Perioperative non-STEMI March, 2014  //   cardiac catheterization at time of non-STEMI, March, 2014, minor nonobstructive coronary disease, vigorous LV function, possibly vasospastic event versus stress cardiomyopathy. No further workup of coronary disease   CHF (congestive heart failure) ([place])    Diverticulosis of colon 10/17/2017   Noted on colonoscopy 10/14/17   Ejection fraction    65-70%, vigorous function, echo, March, 2011   Ejection fraction    Vigorous function at time of catheterization October 06, 2012,   History of colonic polyps 10/17/2017   Colonoscopy 10/14/17   Hyperlipidemia    Hypertension    Internal hemorrhoids without complication [id number]   Noted on colonoscopy 10/14/17   Lung nodule 12/2009   Very small, left upper lobe by CT June, 2011, one-year followup was stable   Myocardial infarction ([place]) 09/2013   OSA (obstructive sleep apnea)    CPAP   Overweight(278.02)    Pulmonary embolus ([place]) 01/16/10   on Coumadin indefinitely   Shortness of breath    Shoulder pain    Sleep apnea    Warfarin anticoagulation     Past Surgical  History:  Procedure Laterality Date   ABDOMINAL HYSTERECTOMY  07/2002   laparoscopic assisted vaginal hysterectomy for menorrhagia, dysmenorrhea, anemia, fibroids   COLOSTOMY     INCISIONAL HERNIA REPAIR N/A 09/27/2012   Procedure: LAPAROSCOPIC INCISIONAL HERNIA possible open;  Surgeon: Adin Hector, MD;  Location: [facility];  Service: General;  Laterality: N/A;   INSERTION OF MESH N/A 09/27/2012   Procedure: INSERTION OF MESH;  Surgeon: Adin Hector, MD;  Location: [facility];  Service: General;  Laterality: N/A;   KNEE ARTHROSCOPY Left    LEFT HEART CATHETERIZATION WITH CORONARY ANGIOGRAM N/A 10/06/2012   Procedure: LEFT HEART CATHETERIZATION WITH CORONARY ANGIOGRAM;  Surgeon: Sherren Mocha, MD;  Location: [facility] CATH LAB;  Service: Cardiovascular;  Laterality: N/A;    Prior to Admission medications   Medication Sig Start Date End Date Taking? Authorizing Provider  aspirin 81 MG chewable tablet Chew 1 tablet (81 mg total) by mouth daily. 10/08/12  Yes Hosie Poisson, MD  carvedilol (COREG) 6.25 MG tablet TAKE 1 TABLET BY MOUTH TWICE DAILY WITH MEALS 10/02/20  Yes Belva Crome, MD  Cholecalciferol (VITAMIN D) 50 MCG (2000 UT) CAPS Take 1 capsule by mouth daily.   Yes [provider]  diclofenac Sodium (VOLTAREN) 1 % GEL Apply 4g to knee four times a day as needed for pain, apply 2g to shoulder as needed for pain 03/05/21  Yes Lucious Groves, DO  furosemide (LASIX) 20 MG tablet Take 1 tablet (20 mg total) by mouth daily. 10/03/20  Yes Lucious Groves, DO  HYDROcodone-acetaminophen (NORCO) 10-325 MG tablet Take 1 tablet by mouth every 6 (six) hours as needed for severe pain. 04/20/21  Yes Lucious Groves, DO  Insulin Pen Needle (NOVOFINE) 32G X 6 MM MISC 1 Units by Does not apply route daily. 07/08/18  Yes Lucious Groves, DO  lidocaine (XYLOCAINE) 5 % ointment Apply 1 application topically 2 (two) times daily as needed. 09/10/17  Yes Lucious Groves, DO  olmesartan (BENICAR) 40 MG tablet Take 1  tablet (40 mg total) by mouth daily. 07/05/20 07/05/21 Yes Lucious Groves, DO  polyethylene glycol (MIRALAX / GLYCOLAX) packet Take 17 g by mouth daily.   Yes [provider]  rosuvastatin (CRESTOR) 5 MG tablet Take 1 tablet (5 mg total) by mouth at bedtime. 06/04/20  Yes Lucious Groves, DO  Semaglutide, 1 MG/DOSE, 4 MG/3ML SOPN Inject 1 mg into the skin once a week. 11/28/20  Yes Lucious Groves, DO  rivaroxaban (XARELTO) 20 MG TABS tablet Take 1 tablet (20 mg total) by mouth daily with supper. 05/25/20   Lucious Groves, DO    Current Outpatient Medications  Medication Sig Dispense Refill   aspirin 81 MG chewable tablet Chew 1 tablet (81 mg total) by mouth daily. 30 tablet 1   carvedilol (COREG) 6.25 MG tablet TAKE 1 TABLET BY MOUTH TWICE DAILY WITH MEALS 180 tablet 3   Cholecalciferol (VITAMIN D) 50 MCG (2000 UT) CAPS Take 1 capsule by mouth daily.     diclofenac Sodium (VOLTAREN) 1 % GEL Apply 4g to knee four times a day as needed for pain, apply 2g to shoulder as needed for pain 350 g 3   furosemide (LASIX) 20 MG tablet Take 1 tablet (20 mg total) by mouth daily. 90 tablet 1   [START ON 04/20/2021] HYDROcodone-acetaminophen (NORCO) 10-325 MG tablet Take 1 tablet by mouth every 6 (six) hours as needed for severe pain. 100 tablet 0   Insulin Pen Needle (NOVOFINE) 32G X 6 MM MISC 1 Units by Does not apply route daily. 100 each 1   lidocaine (XYLOCAINE) 5 % ointment Apply 1 application topically 2 (two) times daily as needed. 150 g 1   olmesartan (BENICAR) 40 MG tablet Take 1 tablet (40 mg total) by mouth daily. 90 tablet 3   polyethylene glycol (MIRALAX / GLYCOLAX) packet Take 17 g by mouth daily.     rosuvastatin (CRESTOR) 5 MG tablet Take 1 tablet (5 mg total) by mouth at bedtime. 90 tablet 3   Semaglutide, 1 MG/DOSE, 4 MG/3ML SOPN Inject 1 mg into the skin once a week. 9 mL 1   rivaroxaban (XARELTO) 20 MG TABS tablet Take 1 tablet (20 mg total) by mouth daily with supper. 90 tablet  3   Current Facility-Administered Medications  Medication Dose Route Frequency Provider Last Rate Last Admin   0.9 %  sodium chloride infusion  500 mL Intravenous Once Dasja Brase, Lajuan Lines, MD        Allergies as of 03/14/2021   (No Known Allergies)    Family History  Problem Relation Age of Onset   Diabetes Mother    Ovarian cancer Mother    Diabetes Father    Diabetes Sister    Colon polyps Sister    Bone cancer Maternal Grandmother    Breast cancer Neg Hx    Colon cancer Neg Hx    Esophageal cancer Neg Hx    Rectal cancer Neg Hx  Stomach cancer Neg Hx     Social History   Socioeconomic History   Marital status: Single    Spouse name: Not on file   Number of children: 0   Years of education: 12   Highest education level: 10th grade  Occupational History   Occupation: Disabled  Tobacco Use   Smoking status: Former    Types: Cigarettes    Quit date: 05/03/1988    Years since quitting: 32.8   Smokeless tobacco: Never  Vaping Use   Vaping Use: Never used  Substance and Sexual Activity   Alcohol use: Yes    Alcohol/week: 1.0 standard drink    Types: 1 Cans of beer per week    Comment: Beer sometimes.   Drug use: Never   Sexual activity: Not Currently  Other Topics Concern   Not on file  Social History Narrative   Financial assistance approved for 100% discount at [facility] and has [facility] card per [place]6/01/2010      Current Social History 02/11/2021        Patient lives with sister in a home  that is 1 story. There are 4 steps up to the entrance the patient uses. There is a railing.       Patient's method of transportation is personal car.      The highest level of education was some high school.      The patient currently is employed as aid on school bus.      Identified important Relationships are "my sister"       Pets : 0       Interests / Fun: "I like to fish and I love my job"       Current Stressors: "none"       Religious / Personal Beliefs: "I  don't know"        Social Determinants of Radio broadcast assistant Strain: Not on file  Food Insecurity: No Food Insecurity   Worried About Charity fundraiser in the Last Year: Never true   [place] in the Last Year: Never true  Transportation Needs: No Transportation Needs   Lack of Transportation (Medical): No   Lack of Transportation (Non-Medical): No  Physical Activity: Not on file  Stress: No Stress Concern Present   Feeling of Stress : Not at all  Social Connections: Not on file  Intimate Partner Violence: Not on file    Physical Exam: Vital signs in last 24 hours: '@BP'$  133/69   Pulse 83   Temp 98.6 F (37 C)   Resp 18   Ht '5\' 7"'$  (1.702 m)   Wt 275 lb (124.7 kg)   SpO2 100%   BMI 43.07 kg/m  GEN: NAD EYE: Sclerae anicteric ENT: MMM CV: Non-tachycardic Pulm: CTA b/l GI: Soft, NT/ND NEURO:  Alert & Oriented x 3   Zenovia Jarred, MD [facility]  03/14/2021 1:36 PM

## 2021-03-14 NOTE — Progress Notes (Signed)
A and O x3. Report to RN. Tolerated MAC anesthesia well. 

## 2021-03-14 NOTE — Progress Notes (Signed)
Called to room to assist during endoscopic procedure.  Patient ID and intended procedure confirmed with present staff. Received instructions for my participation in the procedure from the performing physician.  

## 2021-03-18 ENCOUNTER — Telehealth: Payer: Self-pay

## 2021-03-18 NOTE — Telephone Encounter (Signed)
  Follow up Call-  Call back number 03/14/2021  Post procedure Call Back phone  # [phone]  Permission to leave phone message Yes  Some recent data might be hidden     Patient questions:  Do you have a fever, pain , or abdominal swelling? No. Pain Score  0 *  Have you tolerated food without any problems? Yes.    Have you been able to return to your normal activities? Yes.    Do you have any questions about your discharge instructions: Diet   No. Medications  No. Follow up visit  No.  Do you have questions or concerns about your Care? No.  Actions: * If pain score is 4 or above: No action needed, pain <4.

## 2021-03-21 ENCOUNTER — Encounter: Payer: Self-pay | Admitting: Internal Medicine

## 2021-03-22 ENCOUNTER — Other Ambulatory Visit: Payer: Self-pay | Admitting: *Deleted

## 2021-03-22 MED ORDER — SEMAGLUTIDE (1 MG/DOSE) 4 MG/3ML ~~LOC~~ SOPN: 1.0000 mg | PEN_INJECTOR | SUBCUTANEOUS | 1 refills | Status: DC

## 2021-04-02 ENCOUNTER — Other Ambulatory Visit: Payer: Self-pay | Admitting: *Deleted

## 2021-04-02 DIAGNOSIS — I509 Heart failure, unspecified: Secondary | ICD-10-CM

## 2021-04-02 MED ORDER — FUROSEMIDE 20 MG PO TABS: 20.0000 mg | ORAL_TABLET | Freq: Every day | ORAL | 1 refills | Status: DC

## 2021-04-08 ENCOUNTER — Other Ambulatory Visit: Payer: Self-pay | Admitting: *Deleted

## 2021-04-25 NOTE — Patient Instructions (Signed)
Goals Addressed             This Visit's Progress    (THN)Eat Healthy   On track    Timeframe:  Long-Range Goal Priority:  High Start Date:       38101751                      Expected End Date:  02585277                  Follow Up Date 82423536   - change to whole grain breads, cereal, pasta - set goal weight - drink 6 to 8 glasses of water each day - fill half of plate with vegetables - limit fast food meals to no more than 1 per week - manage portion size - set a realistic goal    Why is this important?   When you are ready to manage your nutrition or weight, having a plan and setting goals will help.  Taking small steps to change how you eat and exercise is a good place to start.    Notes:  RN discussed healthy meals and snacks [id number] She is learning to adhere to her diet. 08676195 Patient is is doing better with adhering to her diet. She has lost 12 pounds since last outreach     (THN)Learn More About My Health   On track    Timeframe:  Long-Range Goal Priority:  Medium Start Date:  09326712                             Expected End Date:  45809983               Follow Up Date 38250539   - make a list of questions - ask questions - bring a list of my medicines to the visit - speak up when I don't understand    Why is this important?   The best way to learn about your health and care is by talking to the doctor and nurse.  They will answer your questions and give you information in the way that you like best.    Notes:  Patient is going to ask Dr if ok for her to get the [place] vaccines [id number] patient is learning about health maintenance to make her health better. [id number] RN provided additional education on hypertension      (THN)Lifestyle Change   On track    Timeframe:  Long-Range Goal Priority:  High Start Date:    35329924                         Expected End Date: 26834196                  Follow Up Date 22297989   - ask questions to  understand - learn about high blood pressure    Why is this important?   The changes that you are asked to make may be hard to do.  This is especially true when the changes are life-long.  Knowing why it is important to you is the first step.  Working on the change with your family or support person helps you not feel alone.  Reward yourself and family or support person when goals are met. This can be an activity you choose like bowling, hiking, biking, swimming or shooting hoops.     Notes:  Patient  understands she has to change her dietary eating habits 80321224 She is making dietary changes and is showing some weight loss. She will need to work more on exercise routine 82500370 Patient is making dietary changes. She is keeping up with her health maintenance     [facility] and Keep All Appointments   On track    Timeframe:  Long-Range Goal Priority:  Medium Start Date:       48889169                      Expected End Date:    45038882               Follow Up Date  80034917 - call to cancel if needed - keep a calendar with prescription refill dates - keep a calendar with appointment dates    Why is this important?   Part of staying healthy is seeing the doctor for follow-up care.  If you forget your appointments, there are some things you can do to stay on track.    Notes:  91505697 Patient has scheduled dental appointment for 01/28/2021 She will schedule eye exam [id number] Patient has scheduled her dental appointment in December Patient has scheduled flu shot for November 17      [facility] My Cholesterol   On track    Timeframe:  Long-Range Goal Priority:  Medium Start Date:     37482707                        Expected End Date:    86754492             Follow Up Date 01007121 - eat smaller or less servings of red meat - read food labels for fat and fiber    Why is this important?   Changing cholesterol starts with eating heart-healthy foods.  Other steps may be to  increase your activity and to quit if you smoke.    Notes:  Patient needs to change her eating habits that will help improve her cholesterol 97588325 Patient is doing better with her diet.  49826415 Patient is trying to cook her food healthier and monitor portion control     (THN)Stop or Cut Down Having Caffeine   On track    Timeframe:  Long-Range Goal Priority:  Medium Start Date:  83094076                           Expected End Date:  80881103                 Follow Up Date 15945859 - reduce caffeine intake a little at a time    Why is this important?   If you want to decrease the amount of caffeine there are steps you can take to do it.  First, learn to read labels to see if a food or drink contains caffeine. Then, take steps to cut back or stop.    Notes: Patient will have to reduce caffeine such as tea a little at a time and drink more water  29244628 Patient is drinking more water 63817711 Patient is encourage to cut out soda in her diet.     (THN)Track and Manage My Blood Pressure   On track    ,Timeframe:  Long-Range Goal Priority:  High Start Date:     65790383  Expected End Date:  15872761              Follow Up Date 84859276   - check blood pressure daily - check blood pressure weekly - choose a place to take my blood pressure (home, clinic or office, retail store) - write blood pressure results in a log or diary    Why is this important?   You won't feel high blood pressure, but it can still hurt your blood vessels.  High blood pressure can cause heart or kidney problems. It can also cause a stroke.  Making lifestyle changes like losing a little weight or eating less salt will help.  Checking your blood pressure at home and at different times of the day can help to control blood pressure.  If the doctor prescribes medicine remember to take it the way the doctor ordered.  Call the office if you cannot afford the medicine or if there are  questions about it.     Notes:  RN discussed the findings in the Dr office and the importance of taking her blood pressure at home and documenting. 39432003 Blood pressure readings are better 79444619 BP reading today is 110/45.

## 2021-04-25 NOTE — Patient Outreach (Signed)
[place] [facility]) Care Management  [place]  04/25/2021 Late entry   Breanna Wilkins 1961-06-22 [id number]   RN Health Coach telephone call to patient.  [employer] compliance verified. Per patient she is doing good. Her blood pressure is 110/45.  Patient has loss 12 pounds since last outreach. Patient has scheduled her flu vaccine and a dental exam.  She has not started an exercise routine program yet. RN discussed the importance of exercise especially with weight loss. Patient has agreed to follow up outreach call.   Encounter Medications:  Outpatient Encounter Medications as of 04/08/2021  Medication Sig   aspirin 81 MG chewable tablet Chew 1 tablet (81 mg total) by mouth daily.   carvedilol (COREG) 6.25 MG tablet TAKE 1 TABLET BY MOUTH TWICE DAILY WITH MEALS   Cholecalciferol (VITAMIN D) 50 MCG (2000 UT) CAPS Take 1 capsule by mouth daily.   diclofenac Sodium (VOLTAREN) 1 % GEL Apply 4g to knee four times a day as needed for pain, apply 2g to shoulder as needed for pain   furosemide (LASIX) 20 MG tablet Take 1 tablet (20 mg total) by mouth daily.   HYDROcodone-acetaminophen (NORCO) 10-325 MG tablet Take 1 tablet by mouth every 6 (six) hours as needed for severe pain.   Insulin Pen Needle (NOVOFINE) 32G X 6 MM MISC 1 Units by Does not apply route daily.   lidocaine (XYLOCAINE) 5 % ointment Apply 1 application topically 2 (two) times daily as needed.   olmesartan (BENICAR) 40 MG tablet Take 1 tablet (40 mg total) by mouth daily.   polyethylene glycol (MIRALAX / GLYCOLAX) packet Take 17 g by mouth daily.   rivaroxaban (XARELTO) 20 MG TABS tablet Take 1 tablet (20 mg total) by mouth daily with supper.   rosuvastatin (CRESTOR) 5 MG tablet Take 1 tablet (5 mg total) by mouth at bedtime.   Semaglutide, 1 MG/DOSE, 4 MG/3ML SOPN Inject 1 mg into the skin once a week.   No facility-administered encounter medications on file as of 04/08/2021.    Functional Status:  In  your present state of health, do you have any difficulty performing the following activities: 02/21/2021 02/11/2021  Hearing? N N  Vision? Y Y  Comment - -  Difficulty concentrating or making decisions? N N  Walking or climbing stairs? Y Y  Dressing or bathing? N N  Doing errands, shopping? N N  Preparing Food and eating ? - N  Using the Toilet? - N  In the past six months, have you accidently leaked urine? - N  Do you have problems with loss of bowel control? - N  Managing your Medications? - N  Managing your Finances? - N  Housekeeping or managing your Housekeeping? - N  Some recent data might be hidden    Fall/Depression Screening: Fall Risk  04/25/2021 02/21/2021 02/11/2021  Falls in the past year? 0 1 0  Number falls in past yr: 0 0 0  Injury with Fall? 0 0 0  Risk for fall due to : History of fall(s);Impaired balance/gait;Impaired mobility No Fall Risks No Fall Risks  Risk for fall due to: Comment - - -  Follow up Falls evaluation completed Falls prevention discussed Falls evaluation completed;Falls prevention discussed   PHQ 2/9 Scores 02/21/2021 02/11/2021 09/20/2020 07/05/2020 05/22/2020 01/26/2020 12/21/2019  PHQ - 2 Score 0 0 0 0 0 0 0  PHQ- 9 Score - - - 0 - - 0    Assessment:   Care Plan Care  Plan : Hypertension (Adult)  Updates made by Verlin Grills, RN since 04/25/2021 12:00 AM     Problem: Hypertension (Hypertension)   Priority: Medium  Onset Date: 07/19/2020     Long-Range Goal: Hypertension Monitored   Start Date: 07/19/2020  Expected End Date: 07/19/2022  This Visit's Progress: On track  Recent Progress: On track  Priority: High  Note:   Evidence-based guidance:  Promote initial use of ambulatory blood pressure measurements (for 3 days) to rule out "white-coat" effect; identify masked hypertension and presence or absence of nocturnal "dipping" of blood pressure.   Encourage continued use of home blood pressure monitoring and recording in blood pressure log;  include symptoms of hypotension or potential medication side effects in log.  Review blood pressure measurements taken inside and outside of the provider office; establish baseline and monitor trends; compare to target ranges or patient goal.  Share overall cardiovascular risk with patient; encourage changes to lifestyle risk factors, including alcohol consumption, smoking, inadequate exercise, poor dietary habits and stress.   Notes:  [id number] BP 110/45 patient is monitoring closely    Task: Identify and Monitor Blood Pressure Elevation   Due Date: 07/19/2021  Note:   Care Management Activities:    - home or ambulatory blood pressure monitoring encouraged    Notes:  [id number] Patient is monitoring blood pressure and is seeing the readings are getting better [id number] BP 110/45    Problem: Disease Progression (Hypertension)   Priority: Medium  Onset Date: 07/19/2020     Long-Range Goal: (THN)Disease Progression Prevented or Minimized   Start Date: 07/19/2020  Expected End Date: 07/19/2022  This Visit's Progress: On track  Recent Progress: On track  Priority: Medium  Note:   Evidence-based guidance:  Tailor lifestyle advice to individual; review progress regularly; give frequent encouragement and respond positively to incremental successes.  Assess for and promote awareness of worsening disease or development of comorbidity.  Prepare patient for laboratory and diagnostic exams based on risk and presentation.  Prepare patient for use of pharmacologic therapy that may include diuretic, beta-blocker, beta-blocker/thiazide combination, angiotensin-converting enzyme inhibitor, renin-angiotensin blocker or calcium-channel blocker.  Expect periodic adjustments to pharmacologic therapy; manage side effects.  Promote a healthy diet that includes primarily plant-based foods, such as fruits, vegetables, whole grains, beans and legumes, low-fat dairy and lean meats.   Consider moderate  reduction in sodium intake by avoiding the addition of salt to prepared foods and limiting processed meats, canned soup, frozen meals and salty snacks.   Promote a regular, daily exercise goal of 150 minutes per week of moderate exercise based on tolerance, ability and patient choice; consider referral to physical therapist, community wellness and/or activity program.  Encourage the avoidance of no more than 2 hours per day of sedentary activity, such as recreational screen time.  Review sources of stress; explore current coping strategies and encourage use of mindfulness, yoga, meditation or exercise to manage stress.   Notes:  [id number] Patient has lost 12 pounds since last outreach    Task: Alleviate Barriers to Hypertension Treatment   Due Date: 07/19/2022  Note:   Care Management Activities:    - healthy diet promoted - quality of sleep assessed - reduction in sedentary activities encouraged - sleep hygiene techniques encouraged    Notes:  [id number] Patient is doing well with weight loss    Problem: Resistant Hypertension (Hypertension)   Priority: Medium  Onset Date: 07/19/2020     Long-Range Goal: Response to Treatment Maximized   Start  Date: 07/19/2020  Expected End Date: 07/19/2022  This Visit's Progress: On track  Recent Progress: On track  Priority: Medium  Note:   Evidence-based guidance:  Assess patient response to treatment, including presence or absence of medication side effects, degree of blood pressure control and patient satisfaction.  Assess technique (including cuff size and placement), measurement times, condition and calibration of blood pressure cuff set (both at-home and in-office equipment).  Assess factors that may influence response to treatment, including nonadherence to pharmacologic treatment plan, diet or activity changes and/or presence of pain, stress or sleep disturbance.  Screen for signs and symptoms of depression; if present, refer for or  complete a comprehensive assessment.  Evaluate social and economic barriers that may affect adherence to treatment plan  Address pharmacologic nonadherence by simplifying dosing regimen, counseling or support by pharmacist, financial assistance, self-monitoring of blood pressure, use of motivational interviewing, voice or text messages.  Encourage behavioral adherence strategies, like habit-based interventions that link medication taking with existing daily routines.  Assess barriers to regular, daily physical activity; support family or support person-oriented activity changes and utilization of community activity or sports program.  Address barriers to dietary changes, especially sodium restriction, with referrals to community programs, like cooking classes, meal services or intensive education when available.  Refer to community-based peer support program or nurse home-visiting program.  Assess for chronic pain; when present add additional goals (Chronic Pain Care Plan Guide) as needed.  Provide frequent follow-up by telephone, telemonitoring, patient-practice portal or with home visit.  Review alcohol use screen; address using brief intervention beginning with risk that interferes with blood pressure control; refer for treatment when excessive alcohol use is noted.  Screen for obstructive sleep apnea; prepare patient for polysomnography based on risk and presentation and use of noninvasive ventilation to relieve obstructive sleep apnea when present.   Notes:  [id number] Patient is monitoring blood pressure     Task: Facilitate Adherence to Lifestyle Change   Due Date: 07/19/2022  Note:   Care Management Activities:    - home blood pressure monitoring technique reviewed - medication adherence assessment completed - success praised - support and encouragement provided    Notes:  Patient has received blood pressure monitor health coach sent Patient received the instructions on how to use  the monitor [id number] Patient is doing better with diet and is having productive weight loss [id number] Patient has changed lifestyle. She has loss 12 pounds/ monitoring blood pressure. She has scheduled appointments in December and eye exam.      Goals Addressed             This Visit's Progress    (THN)Eat Healthy   On track    Timeframe:  Long-Range Goal Priority:  High Start Date:       82641583                      Expected End Date:  09407680                  Follow Up Date 88110315   - change to whole grain breads, cereal, pasta - set goal weight - drink 6 to 8 glasses of water each day - fill half of plate with vegetables - limit fast food meals to no more than 1 per week - manage portion size - set a realistic goal    Why is this important?   When you are ready to manage your nutrition or weight, having a  plan and setting goals will help.  Taking small steps to change how you eat and exercise is a good place to start.    Notes:  RN discussed healthy meals and snacks [id number] She is learning to adhere to her diet. [id number] Patient is is doing better with adhering to her diet. She has lost 12 pounds since last outreach     (THN)Learn More About My Health   On track    Timeframe:  Long-Range Goal Priority:  Medium Start Date:  11941740                             Expected End Date:  81448185               Follow Up Date 63149702   - make a list of questions - ask questions - bring a list of my medicines to the visit - speak up when I don't understand    Why is this important?   The best way to learn about your health and care is by talking to the doctor and nurse.  They will answer your questions and give you information in the way that you like best.    Notes:  Patient is going to ask Dr if ok for her to get the [place] vaccines [id number] patient is learning about health maintenance to make her health better. [id number] RN provided additional education on  hypertension      (THN)Lifestyle Change   On track    Timeframe:  Long-Range Goal Priority:  High Start Date:    78676720                         Expected End Date: 94709628                  Follow Up Date 36629476   - ask questions to understand - learn about high blood pressure    Why is this important?   The changes that you are asked to make may be hard to do.  This is especially true when the changes are life-long.  Knowing why it is important to you is the first step.  Working on the change with your family or support person helps you not feel alone.  Reward yourself and family or support person when goals are met. This can be an activity you choose like bowling, hiking, biking, swimming or shooting hoops.     Notes:  Patient understands she has to change her dietary eating habits [id number] She is making dietary changes and is showing some weight loss. She will need to work more on exercise routine 65681275 Patient is making dietary changes. She is keeping up with her health maintenance     [facility] and Keep All Appointments   On track    Timeframe:  Long-Range Goal Priority:  Medium Start Date:       17001749                      Expected End Date:    44967591               Follow Up Date  [id number] - call to cancel if needed - keep a calendar with prescription refill dates - keep a calendar with appointment dates    Why is this important?   Part of staying healthy is seeing the doctor for follow-up care.  If you forget your appointments, there are some things you can do to stay on track.    Notes:  95621308 Patient has scheduled dental appointment for 01/28/2021 She will schedule eye exam [id number] Patient has scheduled her dental appointment in December Patient has scheduled flu shot for November 17      [facility] My Cholesterol   On track    Timeframe:  Long-Range Goal Priority:  Medium Start Date:     29528413                        Expected End Date:     24401027             Follow Up Date 25366440 - eat smaller or less servings of red meat - read food labels for fat and fiber    Why is this important?   Changing cholesterol starts with eating heart-healthy foods.  Other steps may be to increase your activity and to quit if you smoke.    Notes:  Patient needs to change her eating habits that will help improve her cholesterol 34742595 Patient is doing better with her diet.  63875643 Patient is trying to cook her food healthier and monitor portion control     (THN)Stop or Cut Down Having Caffeine   On track    Timeframe:  Long-Range Goal Priority:  Medium Start Date:  32951884                           Expected End Date:  16606301                 Follow Up Date 60109323 - reduce caffeine intake a little at a time    Why is this important?   If you want to decrease the amount of caffeine there are steps you can take to do it.  First, learn to read labels to see if a food or drink contains caffeine. Then, take steps to cut back or stop.    Notes: Patient will have to reduce caffeine such as tea a little at a time and drink more water  [id number] Patient is drinking more water 54270623 Patient is encourage to cut out soda in her diet.     (THN)Track and Manage My Blood Pressure   On track    ,Timeframe:  Long-Range Goal Priority:  High Start Date:     76283151                        Expected End Date:  76160737              Follow Up Date 10626948   - check blood pressure daily - check blood pressure weekly - choose a place to take my blood pressure (home, clinic or office, retail store) - write blood pressure results in a log or diary    Why is this important?   You won't feel high blood pressure, but it can still hurt your blood vessels.  High blood pressure can cause heart or kidney problems. It can also cause a stroke.  Making lifestyle changes like losing a little weight or eating less salt will help.  Checking your blood  pressure at home and at different times of the day can help to control blood pressure.  If the doctor prescribes medicine remember to take it the way the doctor ordered.  Call the office  if you cannot afford the medicine or if there are questions about it.     Notes:  RN discussed the findings in the Dr office and the importance of taking her blood pressure at home and documenting. [id number] Blood pressure readings are better 54768915 BP reading today is 110/45.         Plan:  Follow-up: Follow-up in 3 month(s) RN sent educational information on hypertension RN sent information on How to monitor your blood pressure RN sent update assessment to PCP Patient will follow up with Flu vaccine Patient will follow up with Dental appointment Patient will schedule eye exam  [place] [facility] [phone]

## 2021-05-17 ENCOUNTER — Other Ambulatory Visit: Payer: Self-pay | Admitting: Internal Medicine

## 2021-05-17 NOTE — Telephone Encounter (Signed)
Last rx written 04/20/21. Last OV 02/21/21. Next OV 06/06/21. UDS 07/05/20.

## 2021-05-17 NOTE — Telephone Encounter (Signed)
Refill Request   HYDROcodone-acetaminophen (NORCO) 10-325 MG tablet   [place], [place]. (Ph: [phone])

## 2021-05-21 MED ORDER — HYDROCODONE-ACETAMINOPHEN 10-325 MG PO TABS: 1.0000 | ORAL_TABLET | Freq: Four times a day (QID) | ORAL | 0 refills | Status: DC | PRN

## 2021-05-23 ENCOUNTER — Other Ambulatory Visit: Payer: Self-pay | Admitting: *Deleted

## 2021-05-23 DIAGNOSIS — I2609 Other pulmonary embolism with acute cor pulmonale: Secondary | ICD-10-CM

## 2021-05-23 DIAGNOSIS — I82533 Chronic embolism and thrombosis of popliteal vein, bilateral: Secondary | ICD-10-CM

## 2021-05-23 DIAGNOSIS — Z7901 Long term (current) use of anticoagulants: Secondary | ICD-10-CM

## 2021-05-23 NOTE — Telephone Encounter (Signed)
Next appt scheduled 06/06/21 with PCP.

## 2021-05-24 MED ORDER — RIVAROXABAN 20 MG PO TABS: 20.0000 mg | ORAL_TABLET | Freq: Every day | ORAL | 3 refills | Status: DC

## 2021-05-24 MED ORDER — ROSUVASTATIN CALCIUM 5 MG PO TABS: 5.0000 mg | ORAL_TABLET | Freq: Every day | ORAL | 3 refills | Status: DC

## 2021-06-06 ENCOUNTER — Other Ambulatory Visit: Payer: Self-pay

## 2021-06-06 ENCOUNTER — Ambulatory Visit (INDEPENDENT_AMBULATORY_CARE_PROVIDER_SITE_OTHER): Payer: Medicare Other | Admitting: Internal Medicine

## 2021-06-06 ENCOUNTER — Encounter: Payer: Self-pay | Admitting: Internal Medicine

## 2021-06-06 VITALS — BP 164/72 | HR 75 | Temp 97.5°F | Ht 67.0 in | Wt 280.6 lb

## 2021-06-06 DIAGNOSIS — G8929 Other chronic pain: Secondary | ICD-10-CM | POA: Diagnosis not present

## 2021-06-06 DIAGNOSIS — Z23 Encounter for immunization: Secondary | ICD-10-CM

## 2021-06-06 DIAGNOSIS — I10 Essential (primary) hypertension: Secondary | ICD-10-CM

## 2021-06-06 DIAGNOSIS — M1711 Unilateral primary osteoarthritis, right knee: Secondary | ICD-10-CM | POA: Diagnosis not present

## 2021-06-06 DIAGNOSIS — G4733 Obstructive sleep apnea (adult) (pediatric): Secondary | ICD-10-CM

## 2021-06-06 DIAGNOSIS — I5022 Chronic systolic (congestive) heart failure: Secondary | ICD-10-CM

## 2021-06-06 DIAGNOSIS — Z6841 Body Mass Index (BMI) 40.0 and over, adult: Secondary | ICD-10-CM

## 2021-06-06 MED ORDER — SEMAGLUTIDE (2 MG/DOSE) 8 MG/3ML ~~LOC~~ SOPN: 2.0000 mg | PEN_INJECTOR | SUBCUTANEOUS | 3 refills | Status: DC

## 2021-06-07 ENCOUNTER — Other Ambulatory Visit: Payer: Self-pay | Admitting: Internal Medicine

## 2021-06-07 DIAGNOSIS — Z1231 Encounter for screening mammogram for malignant neoplasm of breast: Secondary | ICD-10-CM

## 2021-06-07 MED ORDER — HYDROCODONE-ACETAMINOPHEN 10-325 MG PO TABS: 1.0000 | ORAL_TABLET | Freq: Four times a day (QID) | ORAL | 0 refills | Status: DC | PRN

## 2021-06-07 NOTE — Progress Notes (Signed)
  Subjective:  HPI: Breanna Wilkins is a 60 y.o. female who presents for f/u knee pain. obesity  Please see Assessment and Plan below for the status of her chronic medical problems.  Objective:  Physical Exam: Vitals:   06/06/21 1008 06/06/21 1108  BP: (!) 142/66 (!) 164/72  Pulse: 75   Temp: (!) 97.5 F (36.4 C)   TempSrc: Oral   SpO2: 100%   Weight: 280 lb 9.6 oz (127.3 kg)   Height: 5\' 7"  (1.702 m)    Body mass index is 43.95 kg/m. Physical Exam Constitutional:      Appearance: Normal appearance. She is obese.  Cardiovascular:     Rate and Rhythm: Normal rate and regular rhythm.  Pulmonary:     Effort: Pulmonary effort is normal.     Breath sounds: Normal breath sounds.  Musculoskeletal:     Right knee: No swelling or effusion. Tenderness present over the medial joint line and lateral joint line. Normal alignment.     Left knee: No swelling or effusion. Tenderness present over the medial joint line. No lateral joint line tenderness. Normal alignment.  Neurological:     Mental Status: She is alert.   Assessment & Plan:  See Encounters Tab for problem based charting.  Medications Ordered Meds ordered this encounter  Medications   Semaglutide, 2 MG/DOSE, 8 MG/3ML SOPN    Sig: Inject 2 mg into the skin once a week.    Dispense:  9 mL    Refill:  3    Dose increase to 2mg    Other Orders Orders Placed This Encounter  Procedures   Flu Vaccine QUAD 31mo+IM (Fluarix, Fluzone & Alfiuria Quad PF)   Follow Up: Return in about 3 months (around 09/06/2021).  PROCEDURE NOTE  PROCEDURE: right knee joint steroid injection.  PREOPERATIVE DIAGNOSIS: Osteoarthritis of the right knee.  POSTOPERATIVE DIAGNOSIS: Osteoarthritis of the right knee.  PROCEDURE: The patient was apprised of the risks and the benefits of the procedure and informed consent was obtained, as witnessed by [facility]. Time-out procedure was performed, with confirmation of the patient's name, date of  birth, and correct identification of the right knee to be injected. The patient's knee was then marked at the appropriate site for injection placement. The knee was sterilely prepped with Betadine. A 40 mg (1 milliliter) solution of Kenalog was drawn up into a 5 mL syringe with a 2 mL of 1% lidocaine. The patient was injected with a 25-gauge needle at the anterior-medial aspect of her right flexed knee. There were no complications. The patient tolerated the procedure well. There was minimal bleeding. The patient was instructed to ice her knee upon leaving clinic and refrain from overuse over the next 3 days. The patient was instructed to go to the emergency room with any usual pain, swelling, or redness occurred in the injected area. The patient was given a followup appointment to evaluate response to the injection to his increased range of motion and reduction of pain.

## 2021-06-07 NOTE — Assessment & Plan Note (Signed)
She does get short of breath with shortness activity, her obesity is likely contributory I do not see any signs of volume overload and she appears euvolemic and well compensated.

## 2021-06-07 NOTE — Assessment & Plan Note (Signed)
She has been having some increased pain in her right knee and request repeat steroid injection she also notes that below the knee medially she is having some pain on palpitation she is having some Pez anserine bursitis.  I discussed the nature of this and this results from changes in walking mechanics due to osteoarthritis.  Overall I have commended her on her weight loss with Ozempic she will need to keep this up.  We hope to get her below 250 pounds she has been told before that she will need a knee replacement with orthopedics but for now steroid injections have been beneficial.

## 2021-06-07 NOTE — Assessment & Plan Note (Signed)
I congratulated her on her weight loss she is now down to 280 pounds from a height of about 325.  We discussed her goal of continued weight loss.  She is tolerating 1 mg of Ozempic weekly very well.  I discussed with her if we can get 2 mg weekly approved she can tolerate that they may enhance her weight loss she is agreeable to try the 2 mg weekly dose.

## 2021-06-07 NOTE — Assessment & Plan Note (Signed)
Recently has had excellent blood pressure control blood pressure is slightly elevated today.  I will not make any changes but we will follow closely overall I think she has been doing well partially due to her weight loss she is down from 320 pounds last year to 280 today.

## 2021-06-07 NOTE — Assessment & Plan Note (Signed)
She continues to have chronic pain of her knees and shoulder overall she is doing okay with her long-term use of opioid analgesics.  We discussed the plans for continued weight loss and potential need for knee replacement in the future however we have focused on the weight loss for now.  She has had no red flags with her controlled substance medications and overall is doing well.  I will provide her with continued refills.

## 2021-06-17 ENCOUNTER — Telehealth: Payer: Self-pay | Admitting: Internal Medicine

## 2021-06-17 NOTE — Telephone Encounter (Signed)
Pt states she was seen on 06/06/2021 and forgot to mention her CPAP machine.  Pt states she is consistently using her machine but, she is needing a new machine and need a New Sleep Study before she can get a new one.  Please advise if an order can be placed or if she requires another OV.

## 2021-06-17 NOTE — Telephone Encounter (Signed)
I addended the vitsit on the 17th to place split night study

## 2021-06-17 NOTE — Addendum Note (Signed)
Addended by: Lucious Groves on: 06/17/2021 01:33 PM   Modules accepted: Orders

## 2021-06-17 NOTE — Assessment & Plan Note (Signed)
Was not able to get a new CPAP device without repeating sleep study, she has lost weight and last sleep study was 12 years ago.  Will order split night study.

## 2021-06-20 ENCOUNTER — Other Ambulatory Visit: Payer: Self-pay

## 2021-06-20 MED ORDER — OLMESARTAN MEDOXOMIL 40 MG PO TABS: 40.0000 mg | ORAL_TABLET | Freq: Every day | ORAL | 3 refills | Status: DC

## 2021-06-27 ENCOUNTER — Other Ambulatory Visit: Payer: Self-pay | Admitting: *Deleted

## 2021-06-27 MED ORDER — HYDROCODONE-ACETAMINOPHEN 10-325 MG PO TABS: 1.0000 | ORAL_TABLET | Freq: Four times a day (QID) | ORAL | 0 refills | Status: DC | PRN

## 2021-06-27 NOTE — Telephone Encounter (Signed)
Call from pt stating [facility] on [address] in [place] does have Hydrocodone. Pt called the [facility] in [place] on [address]; and they do have the medication. Requesting her doctor to send new rx to this [facility] and the pharmacy in [place] will cancel the one they have. Thanks

## 2021-06-27 NOTE — Telephone Encounter (Signed)
Called pt about Hydrocodone rx - no answer; left message of refill and  to call the pharmacy

## 2021-07-02 ENCOUNTER — Other Ambulatory Visit: Payer: Self-pay | Admitting: *Deleted

## 2021-07-04 NOTE — Patient Outreach (Signed)
[place] [facility]) Care Management [place] Note   07/04/2021 Name:  Breanna Wilkins MRN:  [id number] DOB:  [birth year]  Summary: Per patient she has noted that when she goes to the Dr office her BP is elevated.  Patient stated that her blood pressure is better when she checks it at home. RN discussed with patient about taking her BP  log to her Dr appointment.  Patient has received to steroid injections in knee. Patient has not had any recent falls. .  Recommendations/Changes made from today's visit: Take blood pressure at home and take log to Dr appointments Monitor sodium in diet    Subjective: Breanna Wilkins is an 60 y.o. year old female who is a primary patient of Lucious Groves, DO. The care management team was consulted for assistance with care management and/or care coordination needs.    RN Health Coach completed Telephone Visit today.   Objective:  Medications Reviewed Today     Reviewed by Jerene Bears, MD (Physician) on 03/14/21 at 1337  Med List Status: <None>   Medication Order Taking? Sig Documenting Provider Last Dose Status Informant  0.9 %  sodium chloride infusion [id number]   Pyrtle, Lajuan Lines, MD  Active   aspirin 81 MG chewable tablet [id number] Yes Chew 1 tablet (81 mg total) by mouth daily. Hosie Poisson, MD Past Week Active Self  carvedilol (COREG) 6.25 MG tablet [id number] Yes TAKE 1 TABLET BY MOUTH TWICE DAILY WITH MEALS Belva Crome, MD Past Week Active   Cholecalciferol (VITAMIN D) 50 MCG (2000 UT) CAPS [id number] Yes Take 1 capsule by mouth daily. [provider] Past Week Active   diclofenac Sodium (VOLTAREN) 1 % GEL [id number] Yes Apply 4g to knee four times a day as needed for pain, apply 2g to shoulder as needed for pain Lucious Groves, DO 03/14/2021 Active   furosemide (LASIX) 20 MG tablet [id number] Yes Take 1 tablet (20 mg total) by mouth daily. Lucious Groves, DO Past Week Active   HYDROcodone-acetaminophen (NORCO) 10-325  MG tablet [id number] Yes Take 1 tablet by mouth every 6 (six) hours as needed for severe pain. Lucious Groves, DO Past Week Active   Insulin Pen Needle (NOVOFINE) 32G X 6 MM MISC [id number] Yes 1 Units by Does not apply route daily. Lucious Groves, DO Past Week Active   lidocaine (XYLOCAINE) 5 % ointment [id number] Yes Apply 1 application topically 2 (two) times daily as needed. Lucious Groves, DO Past Week Active   olmesartan (BENICAR) 40 MG tablet [id number] Yes Take 1 tablet (40 mg total) by mouth daily. Lucious Groves, DO Past Week Active   polyethylene glycol Merril Abbe / GLYCOLAX) packet [id number] Yes Take 17 g by mouth daily. [provider] 03/13/2021 Active   rivaroxaban (XARELTO) 20 MG TABS tablet [id number] No Take 1 tablet (20 mg total) by mouth daily with supper. Lucious Groves, DO 03/11/2021 Active   rosuvastatin (CRESTOR) 5 MG tablet [id number] Yes Take 1 tablet (5 mg total) by mouth at bedtime. Lucious Groves, DO Past Week Active   Semaglutide, 1 MG/DOSE, 4 MG/3ML SOPN [id number] Yes Inject 1 mg into the skin once a week. Lucious Groves, DO Past Week Active              SDOH:  (Social Determinants of Health) assessments and interventions performed:  SDOH Interventions    Flowsheet Row Most Recent Value  SDOH Interventions   Food Insecurity Interventions  Intervention Not Indicated  Housing Interventions Intervention Not Indicated  Intimate Partner Violence Interventions Intervention Not Indicated  Transportation Interventions Intervention Not Indicated       Care Plan  Review of patient past medical history, allergies, medications, health status, including review of consultants reports, laboratory and other test data, was performed as part of comprehensive evaluation for care management services.   Care Plan : General Plan of Care (Adult)  Updates made by Brynlynn Walko, Eppie Gibson, RN since 07/04/2021 12:00 AM     Problem: Therapeutic Alliance (General Plan of Care)  Resolved 07/02/2021  Priority: Medium  Note:   Resolving due to duplicate goal     Long-Range Goal: Therapeutic Alliance Established Completed 07/04/2021  Start Date: 05/11/2020  Expected End Date: 07/19/2021  Priority: Medium  Note:   Evidence-based guidance:  Avoid value judgments; convey acceptance.  Encourage collaboration with the treatment team.  Establish rapport; develop trust relationship.  Therapist, art.  Provide emotional support; encourage patient to share feelings of anger, fear and anxiety.  Promote self-reliance and autonomy based on age and ability; discourage overprotection.  Use empathy and nonjudgmental, participatory manner.   Notes:     Task: Develop Relationship to Effect Behavior Change Completed 07/04/2021  Due Date: 07/19/2021  Note:   Care Management Activities:    - acceptance conveyed - care explained - choices provided - emotional support provided - questions answered - questions encouraged - rapport fostered - reassurance provided - verbalization of feelings encouraged    Notes:     Problem: Health Promotion or Disease Self-Management (General Plan of Care) Resolved 07/02/2021  Priority: Medium  Onset Date: 07/19/2020  Note:   Resolving due to duplicate goal     Long-Range Goal: Self-Management Plan Developed Completed 07/02/2021  Start Date: 05/11/2020  Expected End Date: 07/19/2021  Recent Progress: On track  Priority: Medium  Note:   Evidence-based guidance:  Review biopsychosocial determinants of health screens.  Determine level of modifiable health risk.  Assess level of patient activation, level of readiness, importance and confidence to make changes.  Evoke change talk using open-ended questions, pros and cons, as well as looking forward.  Identify areas where behavior change may lead to improved health.  Partner with patient to develop a robust self-management plan that includes lifestyle factors, such as  weight loss, exercise and healthy nutrition, as well as goals specific to disease risks.  Support patient and family/caregiver active participation in decision-making and self-management plan.  Implement additional goals and interventions based on identified risk factors to reduce health risk.  Facilitate advance care planning.  Review need for preventive screening based on age, sex, family history and health history.   Notes:  [id number] Resolving due to duplicate goal     Task: Mutually Develop and Royce Macadamia Achievement of Patient Goals Completed 07/04/2021  Due Date: 07/19/2021  Note:   Care Management Activities:    - choices provided - decision-making supported - health risks reviewed - questions answered - reassurance provided - verbalization of feelings encouraged    Notes:     Problem: Coping Skills (General Plan of Care) Resolved 07/02/2021  Note:   Resolving due to duplicate goal     Long-Range Goal: Coping Skills Enhanced Completed 07/04/2021  Start Date: 05/11/2020  Expected End Date: 01/17/2021  Priority: Medium  Note:   Evidence-based guidance:  Acknowledge, normalize and validate difficulty of making life-long lifestyle changes.  Identify current effective and ineffective coping strategies.  Encourage patient and caregiver participation in care  to increase self-esteem, confidence and feelings of control.  Consider alternative and complementary therapy approaches such as meditation, mindfulness or yoga.  Encourage participation in cognitive behavioral therapy to foster a positive identity, increase self-awareness, as well as bolster self-esteem, confidence and self-efficacy.  Discuss spirituality; be present as concerns are identified; encourage journaling, prayer, worship services, meditation or pastoral counseling.  Encourage participation in pleasurable group activities such as hobbies, singing, sports or volunteering).  Encourage the use of mindfulness; refer  for training or intensive intervention.  Consider the use of meditative movement therapy such as tai chi, yoga or qigong.  Promote a regular daily exercise program based on tolerance, ability and patient choice to support positive thinking about disease or aging.   Notes:     Task: Support Psychosocial Response to Risk or Actual Health Condition Completed 07/04/2021  Due Date: 01/17/2021  Note:   Care Management Activities:    - active listening utilized - decision-making supported - healthy lifestyle promoted - verbalization of feelings encouraged    Notes:     Problem: Quality of Life (General Plan of Care) Resolved 07/02/2021  Priority: Medium  Onset Date: 05/11/2020  Note:   Resolving due to duplicate goal     Long-Range Goal: Quality of Life Maintained Completed 07/04/2021  Start Date: 07/19/2020  Expected End Date: 07/19/2021  Recent Progress: On track  Priority: Medium  Note:   Evidence-based guidance:  Assess patient's thoughts about quality of life, goals and expectations, and dissatisfaction or desire to improve.  Identify issues of primary importance such as mental health, illness, exercise tolerance, pain, sexual function and intimacy, cognitive change, social isolation, finances and relationships.  Assess and monitor for signs/symptoms of psychosocial concerns, especially depression or ideations regarding harm to others or self; provide or refer for mental health services as needed.  Identify sensory issues that impact quality of life such as hearing loss, vision deficit; strategize ways to maintain or improve hearing, vision.  Promote access to services in the community to support independence such as support groups, home visiting programs, financial assistance, handicapped parking tags, durable medical equipment and emergency responder.  Promote activities to decrease social isolation such as group support or social, leisure and recreational activities,  employment, use of social media; consider safety concerns about being out of home for activities.  Provide patient an opportunity to share by storytelling or a "life review" to give positive meaning to life and to assist with coping and negative experiences.  Encourage patient to tap into hope to improve sense of self.  Counsel based on prognosis and as early as possible about end-of-life and palliative care; consider referral to palliative care provider.  Advocate for the development of palliative care plan that may include avoidance of unnecessary testing and intervention, symptom control, discontinuation of medications, hospice and organ donation.  Counsel as early as possible those with life-limiting chronic disease about palliative care; consider referral to palliative care provider.  Advocate for the development of palliative care plan.   Notes:     Task: Support and Maintain Acceptable Degree of Health, Comfort and Happiness Completed 07/04/2021  Due Date: 07/19/2021  Note:   Care Management Activities:    - patient strengths promoted - self-expression encouraged - sleep hygiene techniques encouraged - wellness behaviors promoted    Notes:     Care Plan : Wellness (Adult)  Updates made by Verlin Grills, RN since 07/04/2021 12:00 AM     Problem: Healthy Nutrition (Wellness) Resolved 07/02/2021  Priority: High  Onset Date: 07/19/2020  Note:   Resolving due to duplicate goal     Long-Range Goal: Healthy Nutrition Achieved Completed 07/04/2021  Start Date: 07/19/2020  Expected End Date: 07/19/2021  Recent Progress: On track  Priority: Medium  Note:   Evidence-based guidance:  Assess patient perspective on healthy weight, weight loss or weight gain, motivation and readiness for change.  Recommend or set healthy weight goal based on body mass index.  Review current dietary intake and exercise levels.  Encourage small steps toward making change to eating and  exercising.  Provide individualized medical nutrition therapy.   Notes:     Task: Alleviate Barriers to Healthy Eating Completed 07/04/2021  Due Date: 07/19/2021  Note:   Care Management Activities:    - incremental change encouraged - making healthy food choices encouraged - patient perspective of healthy weight considered - praise for success provided - realistic weight (loss or gain) goal mutually set    Notes:     Care Plan : Hypertension (Adult)  Updates made by Verlin Grills, RN since 07/04/2021 12:00 AM     Problem: Hypertension (Hypertension) Resolved 07/02/2021  Priority: Medium  Onset Date: 07/19/2020  Note:   Resolving due to duplicate goal     Long-Range Goal: Hypertension Monitored Completed 07/04/2021  Start Date: 07/19/2020  Expected End Date: 07/19/2022  Recent Progress: On track  Priority: High  Note:   Evidence-based guidance:  Promote initial use of ambulatory blood pressure measurements (for 3 days) to rule out "white-coat" effect; identify masked hypertension and presence or absence of nocturnal "dipping" of blood pressure.   Encourage continued use of home blood pressure monitoring and recording in blood pressure log; include symptoms of hypotension or potential medication side effects in log.  Review blood pressure measurements taken inside and outside of the provider office; establish baseline and monitor trends; compare to target ranges or patient goal.  Share overall cardiovascular risk with patient; encourage changes to lifestyle risk factors, including alcohol consumption, smoking, inadequate exercise, poor dietary habits and stress.   Notes:  [id number] BP 110/45 patient is monitoring closely    Task: Identify and Monitor Blood Pressure Elevation Completed 07/04/2021  Due Date: 07/19/2021  Note:   Care Management Activities:    - home or ambulatory blood pressure monitoring encouraged    Notes:  [id number] Patient is monitoring  blood pressure and is seeing the readings are getting better [id number] BP 110/45    Problem: Disease Progression (Hypertension) Resolved 07/02/2021  Priority: Medium  Onset Date: 07/19/2020  Note:   Resolving due to duplicate goal     Long-Range Goal: (THN)Disease Progression Prevented or Minimized Completed 07/04/2021  Start Date: 07/19/2020  Expected End Date: 07/19/2022  Recent Progress: On track  Priority: Medium  Note:   Evidence-based guidance:  Tailor lifestyle advice to individual; review progress regularly; give frequent encouragement and respond positively to incremental successes.  Assess for and promote awareness of worsening disease or development of comorbidity.  Prepare patient for laboratory and diagnostic exams based on risk and presentation.  Prepare patient for use of pharmacologic therapy that may include diuretic, beta-blocker, beta-blocker/thiazide combination, angiotensin-converting enzyme inhibitor, renin-angiotensin blocker or calcium-channel blocker.  Expect periodic adjustments to pharmacologic therapy; manage side effects.  Promote a healthy diet that includes primarily plant-based foods, such as fruits, vegetables, whole grains, beans and legumes, low-fat dairy and lean meats.   Consider moderate reduction in sodium intake by avoiding the addition of salt to prepared foods  and limiting processed meats, canned soup, frozen meals and salty snacks.   Promote a regular, daily exercise goal of 150 minutes per week of moderate exercise based on tolerance, ability and patient choice; consider referral to physical therapist, community wellness and/or activity program.  Encourage the avoidance of no more than 2 hours per day of sedentary activity, such as recreational screen time.  Review sources of stress; explore current coping strategies and encourage use of mindfulness, yoga, meditation or exercise to manage stress.   Notes:  [id number] Patient has lost 12 pounds  since last outreach    Task: Alleviate Barriers to Hypertension Treatment Completed 07/04/2021  Due Date: 07/19/2022  Note:   Care Management Activities:    - healthy diet promoted - quality of sleep assessed - reduction in sedentary activities encouraged - sleep hygiene techniques encouraged    Notes:  [id number] Patient is doing well with weight loss    Problem: Resistant Hypertension (Hypertension) Resolved 07/02/2021  Priority: Medium  Onset Date: 07/19/2020  Note:   Resolving due to duplicate goal     Long-Range Goal: Response to Treatment Maximized Completed 07/04/2021  Start Date: 07/19/2020  Expected End Date: 07/19/2022  Recent Progress: On track  Priority: Medium  Note:   Evidence-based guidance:  Assess patient response to treatment, including presence or absence of medication side effects, degree of blood pressure control and patient satisfaction.  Assess technique (including cuff size and placement), measurement times, condition and calibration of blood pressure cuff set (both at-home and in-office equipment).  Assess factors that may influence response to treatment, including nonadherence to pharmacologic treatment plan, diet or activity changes and/or presence of pain, stress or sleep disturbance.  Screen for signs and symptoms of depression; if present, refer for or complete a comprehensive assessment.  Evaluate social and economic barriers that may affect adherence to treatment plan  Address pharmacologic nonadherence by simplifying dosing regimen, counseling or support by pharmacist, financial assistance, self-monitoring of blood pressure, use of motivational interviewing, voice or text messages.  Encourage behavioral adherence strategies, like habit-based interventions that link medication taking with existing daily routines.  Assess barriers to regular, daily physical activity; support family or support person-oriented activity changes and utilization of  community activity or sports program.  Address barriers to dietary changes, especially sodium restriction, with referrals to community programs, like cooking classes, meal services or intensive education when available.  Refer to community-based peer support program or nurse home-visiting program.  Assess for chronic pain; when present add additional goals (Chronic Pain Care Plan Guide) as needed.  Provide frequent follow-up by telephone, telemonitoring, patient-practice portal or with home visit.  Review alcohol use screen; address using brief intervention beginning with risk that interferes with blood pressure control; refer for treatment when excessive alcohol use is noted.  Screen for obstructive sleep apnea; prepare patient for polysomnography based on risk and presentation and use of noninvasive ventilation to relieve obstructive sleep apnea when present.   Notes:  [id number] Patient is monitoring blood pressure     Task: Facilitate Adherence to Lifestyle Change Completed 07/04/2021  Due Date: 07/19/2022  Note:   Care Management Activities:    - home blood pressure monitoring technique reviewed - medication adherence assessment completed - success praised - support and encouragement provided    Notes:  Patient has received blood pressure monitor health coach sent Patient received the instructions on how to use the monitor [id number] Patient is doing better with diet and is having productive weight loss [id number] Patient  has changed lifestyle. She has loss 12 pounds/ monitoring blood pressure. She has scheduled appointments in December and eye exam.    Care Plan : [place] of Care  Updates made by Albert Hersch, Eppie Gibson, RN since 07/04/2021 12:00 AM     Problem: Knowledge Deficit  Related to Hypertension and Care Coordination Needs   Priority: High     Long-Range Goal: Development  Plan Of Care for Management of Hypertension   Start Date: 07/02/2021  Expected End Date:  07/19/2022  Priority: High  Note:   Current Barriers:  Knowledge Deficits related to plan of care for management of HTN   RNCM Clinical Goal(s):  Patient will verbalize understanding of plan for management of HTN as evidenced by Continuation of monitoring blood pressure and adhering to 2300 mg sodium diet  through collaboration with RN Care manager, provider, and care team.   Interventions: Inter-disciplinary care team collaboration (see longitudinal plan of care) Evaluation of current treatment plan related to  self management and patient's adherence to plan as established by provider    Patient Goals/Self-Care Activities: Take medications as prescribed   Attend all scheduled provider appointments Call pharmacy for medication refills 3-7 days in advance of running out of medications Attend church or other social activities Perform all self care activities independently  Perform IADL's (shopping, preparing meals, housekeeping, managing finances) independently Call provider office for new concerns or questions  check blood pressure daily choose a place to take my blood pressure (home, clinic or office, retail store) learn about high blood pressure take blood pressure log to all doctor appointments call doctor for signs and symptoms of high blood pressure develop an action plan for high blood pressure keep all doctor appointments take medications for blood pressure exactly as prescribed begin an exercise program report new symptoms to your doctor limit salt intake to 2300 mg/day        Plan: Telephone follow up appointment with care management team member scheduled for:  March 2023 The patient has been provided with contact information for the care management team and has been advised to call with any health related questions or concerns.   [place] Care Management [phone]

## 2021-07-04 NOTE — Patient Instructions (Signed)
Visit Information  Thank you for taking time to visit with me today. Please don't hesitate to contact me if I can be of assistance to you before our next scheduled telephone appointment.  Following are the goals we discussed today:  Current Barriers:  Knowledge Deficits related to plan of care for management of HTN   RNCM Clinical Goal(s):  Patient will verbalize understanding of plan for management of HTN as evidenced by Continuation of monitoring blood pressure and adhering to 2300 mg sodium diet through collaboration with RN Care manager, provider, and care team.   Interventions: Inter-disciplinary care team collaboration (see longitudinal plan of care) Evaluation of current treatment plan related to  self management and patient's adherence to plan as established by provider    Patient Goals/Self-Care Activities: Take medications as prescribed   Attend all scheduled provider appointments Call pharmacy for medication refills 3-7 days in advance of running out of medications Attend church or other social activities Perform all self care activities independently  Perform IADL's (shopping, preparing meals, housekeeping, managing finances) independently Call provider office for new concerns or questions  check blood pressure daily choose a place to take my blood pressure (home, clinic or office, retail store) learn about high blood pressure take blood pressure log to all doctor appointments call doctor for signs and symptoms of high blood pressure develop an action plan for high blood pressure keep all doctor appointments take medications for blood pressure exactly as prescribed begin an exercise program report new symptoms to your doctor limit salt intake to 2300 mg/day    Our next appointment is by telephone on March 2023  Please call the [facility] at [phone]  if you need to cancel or reschedule your appointment.   Please call the Suicide and Crisis Lifeline: 988  if you are experiencing a Mental Health or [place] or need someone to talk to.  The patient verbalized understanding of instructions, educational materials, and care plan provided today and agreed to receive a mailed copy of patient instructions, educational materials, and care plan.   Telephone follow up appointment with care management team member scheduled for: The patient has been provided with contact information for the care management team and has been advised to call with any health related questions or concerns.   [place] Care Management [phone]

## 2021-07-18 ENCOUNTER — Ambulatory Visit
Admission: RE | Admit: 2021-07-18 | Discharge: 2021-07-18 | Disposition: A | Discharge status: Home / Self Care | Payer: Medicare Other | Source: Ambulatory Visit | Attending: Internal Medicine | Admitting: Internal Medicine

## 2021-07-18 DIAGNOSIS — Z1231 Encounter for screening mammogram for malignant neoplasm of breast: Secondary | ICD-10-CM

## 2021-07-19 ENCOUNTER — Other Ambulatory Visit: Payer: Self-pay | Admitting: Internal Medicine

## 2021-07-19 DIAGNOSIS — R928 Other abnormal and inconclusive findings on diagnostic imaging of breast: Secondary | ICD-10-CM

## 2021-07-29 ENCOUNTER — Other Ambulatory Visit: Payer: Self-pay

## 2021-07-29 ENCOUNTER — Other Ambulatory Visit: Payer: Self-pay | Admitting: *Deleted

## 2021-07-29 MED ORDER — HYDROCODONE-ACETAMINOPHEN 10-325 MG PO TABS: 1.0000 | ORAL_TABLET | Freq: Four times a day (QID) | ORAL | 0 refills | Status: DC | PRN

## 2021-07-29 NOTE — Telephone Encounter (Signed)
Patient called in stating [facility] in [place] does not have hydrocodone and do not know when they will get any in. She is requesting Rx be resent to [facility] in [place]. 3 Rxs dated 06/07/21 cancelled with Leanne at [facility].

## 2021-07-30 MED ORDER — DICLOFENAC SODIUM 1 % EX GEL: CUTANEOUS | 3 refills | Status: DC

## 2021-08-15 ENCOUNTER — Other Ambulatory Visit: Payer: Self-pay | Admitting: Internal Medicine

## 2021-08-15 ENCOUNTER — Ambulatory Visit
Admission: RE | Admit: 2021-08-15 | Discharge: 2021-08-15 | Disposition: A | Discharge status: Home / Self Care | Payer: Medicare Other | Source: Ambulatory Visit | Attending: Internal Medicine | Admitting: Internal Medicine

## 2021-08-15 DIAGNOSIS — N6320 Unspecified lump in the left breast, unspecified quadrant: Secondary | ICD-10-CM

## 2021-08-15 DIAGNOSIS — R928 Other abnormal and inconclusive findings on diagnostic imaging of breast: Secondary | ICD-10-CM

## 2021-08-18 ENCOUNTER — Other Ambulatory Visit: Payer: Self-pay

## 2021-08-18 ENCOUNTER — Ambulatory Visit (HOSPITAL_BASED_OUTPATIENT_CLINIC_OR_DEPARTMENT_OTHER): Payer: Medicare Other | Attending: Internal Medicine | Admitting: Internal Medicine

## 2021-08-18 VITALS — Ht 67.0 in | Wt 275.0 lb

## 2021-08-18 DIAGNOSIS — G4733 Obstructive sleep apnea (adult) (pediatric): Secondary | ICD-10-CM | POA: Insufficient documentation

## 2021-08-18 DIAGNOSIS — Z6841 Body Mass Index (BMI) 40.0 and over, adult: Secondary | ICD-10-CM | POA: Diagnosis not present

## 2021-08-24 DIAGNOSIS — G4733 Obstructive sleep apnea (adult) (pediatric): Secondary | ICD-10-CM | POA: Diagnosis not present

## 2021-08-24 NOTE — Procedures (Signed)
° °  Patient Name: Breanna Wilkins, Breanna Wilkins Date: 08/18/2021 Gender: Female D.O.B: 1960-09-10 Age (years): 58 Referring Provider: Lemar Lofty DO Height (inches): 57 Interpreting Physician: Baird Lyons MD, ABSM Weight (lbs): 275 RPSGT: Jacolyn Reedy BMI: 60 MRN: [id number] Neck Size: 15.00  CLINICAL INFORMATION The patient is referred for a BiPAP titration to treat sleep apnea.   SLEEP STUDY TECHNIQUE As per the AASM Manual for the Scoring of Sleep and Associated Events v2.3 (April 2016) with a hypopnea requiring 4% desaturations.  The channels recorded and monitored were frontal, central and occipital EEG, electrooculogram (EOG), submentalis EMG (chin), nasal and oral airflow, thoracic and abdominal wall motion, anterior tibialis EMG, snore microphone, electrocardiogram, and pulse oximetry. Bilevel positive airway pressure (BPAP) was initiated at the beginning of the study and titrated to treat sleep-disordered breathing.  MEDICATIONS Medications self-administered by patient taken the night of the study : none reported  RESPIRATORY PARAMETERS Optimal IPAP Pressure (cm): 13 AHI at Optimal Pressure (/hr) 7 Optimal EPAP Pressure (cm): 9   Overall Minimal O2 (%): 75.0 Minimal O2 at Optimal Pressure (%): 90.0 SLEEP ARCHITECTURE Start Time: 10:20:52 PM Stop Time: 5:11:01 AM Total Time (min): 410.2 Total Sleep Time (min): 286.5 Sleep Latency (min): 16.4 Sleep Efficiency (%): 69.9% REM Latency (min): 116.0 WASO (min): 107.3 Stage N1 (%): 2.8% Stage N2 (%): 86.9% Stage N3 (%): 0.0% Stage R (%): 10.3 Supine (%): 100.00 Arousal Index (/hr): 14.0   CARDIAC DATA The 2 lead EKG demonstrated sinus rhythm. The mean heart rate was 67.2 beats per minute. Other EKG findings include: None.  LEG MOVEMENT DATA The total Periodic Limb Movements of Sleep (PLMS) were 0. The PLMS index was 0.0. A PLMS index of <15 is considered normal in adults.  IMPRESSIONS - Severe obstructive sleep apnea,  AHI 83/ hr. - CPAP titration did not provide adequate control at tolerated presures and wass converted to Bilevel (BIPAP) titration to 13/9. - Central sleep apnea was not noted during this titration (CAI = 4.2/h). - Severe oxygen desaturations were observed during this titration (min O2 = 75.0%). Minimum O2 saturation on BIPAP 13/9 was 90%. - The patient snored with moderate snoring volume. - No cardiac abnormalities were observed during this study. - Clinically significant periodic limb movements were not noted during this study. Arousals associated with PLMs were rare.  DIAGNOSIS - Obstructive Sleep Apnea (G47.33)  RECOMMENDATIONS - Trial of BiPAP therapy on 13/9 cm H2O. Patient used a Small size Fisher&Paykel Full Face Mask Simplus mask and heated humidification. - Be careful with alcohol, sedatives and other CNS depressants that may worsen sleep apnea and disrupt normal sleep architecture. - Sleep hygiene should be reviewed to assess factors that may improve sleep quality. - Weight management and regular exercise should be initiated or continued.  [Electronically signed] 08/24/2021 12:42 PM  Baird Lyons MD, [place], American Board of Sleep Medicine   NPI: [id number]                          [place], [place] [facility]  ELECTRONICALLY SIGNED ON:  08/24/2021, 12:36 PM [place] PH: [phone]   FX: [phone] [place]

## 2021-08-27 ENCOUNTER — Other Ambulatory Visit: Payer: Self-pay

## 2021-08-27 MED ORDER — HYDROCODONE-ACETAMINOPHEN 10-325 MG PO TABS: 1.0000 | ORAL_TABLET | Freq: Four times a day (QID) | ORAL | 0 refills | Status: DC | PRN

## 2021-08-28 ENCOUNTER — Other Ambulatory Visit: Payer: Medicare Other

## 2021-08-30 ENCOUNTER — Other Ambulatory Visit: Payer: Self-pay | Admitting: Internal Medicine

## 2021-08-30 DIAGNOSIS — G4733 Obstructive sleep apnea (adult) (pediatric): Secondary | ICD-10-CM

## 2021-08-30 NOTE — Progress Notes (Signed)
Reviewed sleep study, severe OSA, recommending Bipap 13/9 placed orders

## 2021-09-03 ENCOUNTER — Ambulatory Visit
Admission: RE | Admit: 2021-09-03 | Discharge: 2021-09-03 | Disposition: A | Discharge status: Home / Self Care | Payer: Medicare Other | Source: Ambulatory Visit | Attending: Internal Medicine | Admitting: Internal Medicine

## 2021-09-03 DIAGNOSIS — N6320 Unspecified lump in the left breast, unspecified quadrant: Secondary | ICD-10-CM

## 2021-09-03 HISTORY — PX: BREAST BIOPSY: SHX20

## 2021-09-04 ENCOUNTER — Telehealth: Payer: Self-pay | Admitting: *Deleted

## 2021-09-04 ENCOUNTER — Telehealth: Payer: Self-pay | Admitting: Hematology and Oncology

## 2021-09-04 NOTE — Telephone Encounter (Signed)
Spoke with patient to confirm morning clinic appointment for 2/22, packet will be mailed

## 2021-09-04 NOTE — Telephone Encounter (Signed)
Call from patient stated that a small lump was found in her left breast she heard on TV that you should not take Ozempic.  Is going to stop taking the Ozempic and is awaiting an appointment for follow up of the breast lump.

## 2021-09-05 NOTE — Telephone Encounter (Signed)
Called Breanna Wilkins, we discussed her biopsy which revealed breast cancer, she has an appointment with oncology.  For her ozempic question I relayed that to my knowledge it is not linked with breast cancer but it would be fine with me if she wants to hold off taking it until she sees oncology.

## 2021-09-06 ENCOUNTER — Encounter: Payer: Self-pay | Admitting: *Deleted

## 2021-09-09 NOTE — Progress Notes (Signed)
Radiation Oncology         [phone] ________________________________  Name: Breanna Wilkins        MRN: [id number]  Date of Service: 09/11/2021 DOB: 1960/10/10  FB:PZWCHEN, Rachel Moulds, DO  [name], MD     REFERRING PHYSICIAN: [name], MD   DIAGNOSIS: The encounter diagnosis was Malignant neoplasm of upper-outer quadrant of left breast in female, estrogen receptor positive ([place]).   HISTORY OF PRESENT ILLNESS: Breanna Wilkins is a 61 y.o. female seen in the multidisciplinary breast clinic for a new diagnosis of right breast cancer. The patient was noted to have a screening detected mass in the left breast. The mass was in the upper outer quadrant of the breast at 10:00 measuring up to 1.3 cm. Her axilla was negative for adenopathy. Biopsies on 09/03/21 of the mass showed a grade 1-2 invasive ductal carcinoma that was ER/PR positive, HER2 negative with a Ki 67 of <5%. She's seen today to discuss treatment of her cancer.     PREVIOUS RADIATION THERAPY: No   PAST MEDICAL HISTORY:  Past Medical History:  Diagnosis Date   Abdominal hernia    Arthritis    CAD (coronary artery disease)    Perioperative non-STEMI March, 2014  //   cardiac catheterization at time of non-STEMI, March, 2014, minor nonobstructive coronary disease, vigorous LV function, possibly vasospastic event versus stress cardiomyopathy. No further workup of coronary disease   CHF (congestive heart failure) ([place])    Diverticulosis of colon 10/17/2017   Noted on colonoscopy 10/14/17   Ejection fraction    65-70%, vigorous function, echo, March, 2011   Ejection fraction    Vigorous function at time of catheterization October 06, 2012,   History of colonic polyps 10/17/2017   Colonoscopy 10/14/17   Hyperlipidemia    Hypertension    Internal hemorrhoids without complication 2/77/8242   Noted on colonoscopy 10/14/17   Lung nodule 12/2009   Very small, left upper lobe by CT June, 2011, one-year followup was stable    Myocardial infarction ([place]) 09/2013   OSA (obstructive sleep apnea)    CPAP   Overweight(278.02)    Pulmonary embolus ([place]) 01/16/10   on Coumadin indefinitely   Shortness of breath    Shoulder pain    Sleep apnea    Warfarin anticoagulation        PAST SURGICAL HISTORY: Past Surgical History:  Procedure Laterality Date   ABDOMINAL HYSTERECTOMY  07/2002   laparoscopic assisted vaginal hysterectomy for menorrhagia, dysmenorrhea, anemia, fibroids   BREAST BIOPSY Left 09/03/2021   COLOSTOMY     INCISIONAL HERNIA REPAIR N/A 09/27/2012   Procedure: LAPAROSCOPIC INCISIONAL HERNIA possible open;  Surgeon: Adin Hector, MD;  Location: [facility];  Service: General;  Laterality: N/A;   INSERTION OF MESH N/A 09/27/2012   Procedure: INSERTION OF MESH;  Surgeon: Adin Hector, MD;  Location: [facility];  Service: General;  Laterality: N/A;   KNEE ARTHROSCOPY Left    LEFT HEART CATHETERIZATION WITH CORONARY ANGIOGRAM N/A 10/06/2012   Procedure: LEFT HEART CATHETERIZATION WITH CORONARY ANGIOGRAM;  Surgeon: Sherren Mocha, MD;  Location: [facility];  Service: Cardiovascular;  Laterality: N/A;     FAMILY HISTORY:  Family History  Problem Relation Age of Onset   Diabetes Mother    Ovarian cancer Mother    Diabetes Father    Diabetes Sister    Colon polyps Sister    Bone cancer Maternal Grandmother    Breast cancer Neg Hx  Colon cancer Neg Hx    Esophageal cancer Neg Hx    Rectal cancer Neg Hx    Stomach cancer Neg Hx      SOCIAL HISTORY:  reports that she quit smoking about 33 years ago. Her smoking use included cigarettes. She has never used smokeless tobacco. She reports current alcohol use of about 1.0 standard drink per week. She reports that she does not use drugs.  The patient is single and lives in [place] but spends a lot of time in [place], [place]. She's accompanied by her friend Alonza Smoker.   ALLERGIES: Patient has no known allergies.   MEDICATIONS:   Current Outpatient Medications  Medication Sig Dispense Refill   aspirin 81 MG chewable tablet Chew 1 tablet (81 mg total) by mouth daily. 30 tablet 1   carvedilol (COREG) 6.25 MG tablet TAKE 1 TABLET BY MOUTH TWICE DAILY WITH MEALS 180 tablet 3   Cholecalciferol (VITAMIN D) 50 MCG (2000 UT) CAPS Take 1 capsule by mouth daily.     diclofenac Sodium (VOLTAREN) 1 % GEL Apply 4g to knee four times a day as needed for pain, apply 2g to shoulder as needed for pain 350 g 3   furosemide (LASIX) 20 MG tablet Take 1 tablet (20 mg total) by mouth daily. 90 tablet 1   HYDROcodone-acetaminophen (NORCO) 10-325 MG tablet Take 1 tablet by mouth every 6 (six) hours as needed for severe pain. 100 tablet 0   Insulin Pen Needle (NOVOFINE) 32G X 6 MM MISC 1 Units by Does not apply route daily. 100 each 1   lidocaine (XYLOCAINE) 5 % ointment Apply 1 application topically 2 (two) times daily as needed. 150 g 1   olmesartan (BENICAR) 40 MG tablet Take 1 tablet (40 mg total) by mouth daily. 90 tablet 3   polyethylene glycol (MIRALAX / GLYCOLAX) packet Take 17 g by mouth daily.     rivaroxaban (XARELTO) 20 MG TABS tablet Take 1 tablet (20 mg total) by mouth daily with supper. 90 tablet 3   rosuvastatin (CRESTOR) 5 MG tablet Take 1 tablet (5 mg total) by mouth at bedtime. 90 tablet 3   Semaglutide, 2 MG/DOSE, 8 MG/3ML SOPN Inject 2 mg into the skin once a week. 9 mL 3   No current facility-administered medications for this encounter.     REVIEW OF SYSTEMS: On review of systems, the patient reports that she is doing well overall. She's had breast bruising since her biopsy. She reports occasional palpitations but no chest pain. She has complaints of arthritis in her knees. No other complaints are verbalized.      PHYSICAL EXAM:  Wt Readings from Last 3 Encounters:  08/18/21 275 lb (124.7 kg)  06/06/21 280 lb 9.6 oz (127.3 kg)  03/14/21 275 lb (124.7 kg)   Temp Readings from Last 3 Encounters:  06/06/21 (!) 97.5  F (36.4 C) (Oral)  03/14/21 98.6 F (37 C)  02/21/21 97.7 F (36.5 C) (Oral)   BP Readings from Last 3 Encounters:  06/06/21 (!) 164/72  03/14/21 120/64  02/21/21 (!) 110/45   Pulse Readings from Last 3 Encounters:  06/06/21 75  03/14/21 89  02/21/21 65    In general this is a well appearing African American female in no acute distress. She's alert and oriented x4 and appropriate throughout the examination. Cardiopulmonary assessment is negative for acute distress and she exhibits normal effort. Bilateral breast exam is deferred.    ECOG = 1  0 - Asymptomatic (  Fully active, able to carry on all predisease activities without restriction)  1 - Symptomatic but completely ambulatory (Restricted in physically strenuous activity but ambulatory and able to carry out work of a light or sedentary nature. For example, light housework, office work)  2 - Symptomatic, <50% in bed during the day (Ambulatory and capable of all self care but unable to carry out any work activities. Up and about more than 50% of waking hours)  3 - Symptomatic, >50% in bed, but not bedbound (Capable of only limited self-care, confined to bed or chair 50% or more of waking hours)  4 - Bedbound (Completely disabled. Cannot carry on any self-care. Totally confined to bed or chair)  5 - Death   Eustace Pen MM, Creech RH, Tormey DC, et al. [phone]). "Toxicity and response criteria of the [facility] Group". [place] Oncol. 5 (6): 649-55    LABORATORY DATA:  Lab Results  Component Value Date   WBC 6.8 02/21/2021   HGB 13.2 02/21/2021   HCT 41.2 02/21/2021   MCV 90 02/21/2021   PLT 333 02/21/2021   Lab Results  Component Value Date   NA 140 02/21/2021   K 4.4 02/21/2021   CL 100 02/21/2021   CO2 25 02/21/2021   Lab Results  Component Value Date   ALT 15 02/25/2018   AST 17 02/25/2018   ALKPHOS 66 02/25/2018   BILITOT 0.3 02/25/2018      RADIOGRAPHY: US BREAST LTD UNI LEFT INC  AXILLA  Result Date: 08/15/2021 CLINICAL DATA:  Patient recalled from screening for left breast mass. EXAM: DIGITAL DIAGNOSTIC UNILATERAL LEFT MAMMOGRAM WITH TOMOSYNTHESIS AND CAD; ULTRASOUND LEFT BREAST LIMITED TECHNIQUE: Left digital diagnostic mammography and breast tomosynthesis was performed. The images were evaluated with computer-aided detection.; Targeted ultrasound examination of the left breast was performed. COMPARISON:  Previous exam(s). ACR Breast Density Category c: The breast tissue is heterogeneously dense, which may obscure small masses. FINDINGS: Within the upper-outer left breast posterior depth there is a persistent irregular mass, further evaluated with spot compression views. Targeted ultrasound is performed, showing a 13 x 12 x 13 mm irregular hypoechoic mass left breast 2 o'clock position 10 cm from nipple. No left axillary adenopathy. IMPRESSION: Suspicious left breast mass. RECOMMENDATION: Ultrasound-guided core needle biopsy left breast mass. I have discussed the findings and recommendations with the patient. If applicable, a reminder letter will be sent to the patient regarding the next appointment. BI-RADS CATEGORY  4: Suspicious. Electronically Signed   By: Lovey Newcomer M.D.   On: 08/15/2021 11:01  MM DIAG BREAST TOMO UNI LEFT  Result Date: 08/15/2021 CLINICAL DATA:  Patient recalled from screening for left breast mass. EXAM: DIGITAL DIAGNOSTIC UNILATERAL LEFT MAMMOGRAM WITH TOMOSYNTHESIS AND CAD; ULTRASOUND LEFT BREAST LIMITED TECHNIQUE: Left digital diagnostic mammography and breast tomosynthesis was performed. The images were evaluated with computer-aided detection.; Targeted ultrasound examination of the left breast was performed. COMPARISON:  Previous exam(s). ACR Breast Density Category c: The breast tissue is heterogeneously dense, which may obscure small masses. FINDINGS: Within the upper-outer left breast posterior depth there is a persistent irregular mass, further  evaluated with spot compression views. Targeted ultrasound is performed, showing a 13 x 12 x 13 mm irregular hypoechoic mass left breast 2 o'clock position 10 cm from nipple. No left axillary adenopathy. IMPRESSION: Suspicious left breast mass. RECOMMENDATION: Ultrasound-guided core needle biopsy left breast mass. I have discussed the findings and recommendations with the patient. If applicable, a reminder letter will be  sent to the patient regarding the next appointment. BI-RADS CATEGORY  4: Suspicious. Electronically Signed   By: Lovey Newcomer M.D.   On: 08/15/2021 11:01  MM CLIP PLACEMENT LEFT  Result Date: 09/03/2021 CLINICAL DATA:  Evaluate biopsy marker EXAM: 3D DIAGNOSTIC LEFT MAMMOGRAM POST ULTRASOUND BIOPSY COMPARISON:  Previous exam(s). FINDINGS: 3D Mammographic images were obtained following ultrasound guided biopsy of a left breast mass. The biopsy marking clip is in expected position at the site of biopsy. IMPRESSION: Appropriate positioning of the ribbon shaped biopsy marking clip at the site of biopsy in the biopsied left breast mass. Final Assessment: Post Procedure Mammograms for Marker Placement Electronically Signed   By: Dorise Bullion III M.D.   On: 09/03/2021 11:20  [place] LT BREAST BX W LOC DEV 1ST LESION IMG BX SPEC US GUIDE  Addendum Date: 09/04/2021   ADDENDUM REPORT: 09/04/2021 19:42 ADDENDUM: Pathology revealed GRADE I-II INVASIVE MAMMARY CARCINOMA of the LEFT beast, 2 o'clock, 10cmfn, (ribbon clip). This was found to be concordant by Dr. Dorise Bullion. Pathology results were discussed with the patient by telephone. The patient reported doing well after the biopsy with tenderness at the site. Post biopsy instructions and care were reviewed and questions were answered. The patient was encouraged to call [facility] for any additional concerns. My direct phone number was provided. The patient was referred to [facility]  at [facility] on September 11, 2021. Pathology results reported by Terie Purser, RN on 09/04/2021. Electronically Signed   By: Dorise Bullion III M.D.   On: 09/04/2021 19:42   Result Date: 09/04/2021 CLINICAL DATA:  Ultrasound-guided biopsy of a left breast mass. EXAM: ULTRASOUND GUIDED LEFT BREAST CORE NEEDLE BIOPSY COMPARISON:  Previous exam(s). PROCEDURE: I met with the patient and we discussed the procedure of ultrasound-guided biopsy, including benefits and alternatives. We discussed the high likelihood of a successful procedure. We discussed the risks of the procedure, including infection, bleeding, tissue injury, clip migration, and inadequate sampling. Informed written consent was given. The usual time-out protocol was performed immediately prior to the procedure. Lesion quadrant: Upper outer quadrant Using sterile technique and 1% Lidocaine as local anesthetic, under direct ultrasound visualization, a 12 gauge spring-loaded device was used to perform biopsy of a 2 o'clock left breast mass using a lateral approach. At the conclusion of the procedure a ribbon shaped tissue marker clip was deployed into the biopsy cavity. Follow up 2 view mammogram was performed and dictated separately. IMPRESSION: Ultrasound guided biopsy of a 2 o'clock left breast mass. No apparent complications. Electronically Signed: By: Dorise Bullion III M.D. On: 09/03/2021 11:22  SLEEP STUDY DOCUMENTS  Result Date: 08/20/2021 Ordered by an unspecified provider.      IMPRESSION/PLAN: 1. Stage IA, [id number] grade 2, ER/PR positive invasive ductal carcinoma of the left breast. Dr. Lisbeth Renshaw discusses the pathology findings and reviews the nature of early stage breast disease. The consensus from the breast conference includes breast conservation with lumpectomy with sentinel node biopsy. Dr. Lisbeth Renshaw recommends adjuvant external radiotherapy to the breast  to reduce risks of local recurrence followed by  antiestrogen therapy. We discussed the risks, benefits, short, and long term effects of radiotherapy, as well as the curative intent, and the patient is interested in proceeding. Dr. Lisbeth Renshaw discusses the delivery and logistics of radiotherapy and anticipates a course of 4 or up to 6 1/2 weeks of radiotherapy to the left breast with deep inspiration breath hold  technique. We will see her back a few weeks after surgery to discuss the simulation process and anticipate we starting radiotherapy about 4-6 weeks after surgery.  2. Possible genetic predisposition to malignancy. The patient is a candidate for genetic testing given her personal and family history. She was offered referral and will meet with genetic counseling today.   In a visit lasting 60 minutes, greater than 50% of the time was spent face to face reviewing her case, as well as in preparation of, discussing, and coordinating the patient's care.  The above documentation reflects my direct findings during this shared patient visit. Please see the separate note by Dr. Lisbeth Renshaw on this date for the remainder of the patient's plan of care.    Carola Rhine, [facility]    **Disclaimer: This note was dictated with voice recognition software. Similar sounding words can inadvertently be transcribed and this note may contain transcription errors which may not have been corrected upon publication of note.**

## 2021-09-10 ENCOUNTER — Other Ambulatory Visit: Payer: Self-pay | Admitting: General Surgery

## 2021-09-10 ENCOUNTER — Other Ambulatory Visit: Payer: Self-pay | Admitting: *Deleted

## 2021-09-10 DIAGNOSIS — C50412 Malignant neoplasm of upper-outer quadrant of left female breast: Secondary | ICD-10-CM

## 2021-09-10 DIAGNOSIS — Z170 Estrogen receptor positive status [ER+]: Secondary | ICD-10-CM

## 2021-09-10 NOTE — Progress Notes (Signed)
[place] NOTE  Patient Care Team: Lucious Groves, DO as PCP - General (Internal Medicine) Belva Crome, MD as PCP - Cardiology (Cardiology) Pleasant, Eppie Gibson, RN as [place] Management [name], MD as Consulting Physician (General Surgery) Benay Pike, MD as Consulting Physician (Hematology and Oncology) Kyung Rudd, MD as Consulting Physician (Radiation Oncology) Rockwell Germany, RN as Oncology Nurse Navigator Mauro Kaufmann, RN as Oncology Nurse Navigator  CHIEF COMPLAINTS/PURPOSE OF CONSULTATION:  Newly diagnosed breast cancer  HISTORY OF PRESENTING ILLNESS:  Breanna Wilkins 61 y.o. female is here because of recent diagnosis of right breast IDC  07/18/2021 screening mammogram showed possible mass in the left breast seen in the upper left breast, no suspicious findings in the right breast. 08/15/2021, unilateral left diagnostic mammogram showed left breast mass measuring 13 x 12 x 13 mm, irregular hypoechoic mass 10 cm from the nipple, no left axillary adenopathy. Left breast biopsy showed invasive mammary carcinoma grade 1 through 2 of 3 measuring 1.9 cm in greatest linear extent prognostics show ER 100% positive, moderate staining intensity, PR 100% positive, strong staining intensity, KI of less than 5%.  E-cadherin is positive supporting a ductal origin. She was referred to breast [place] for additional recommendations. I reviewed her records extensively and collaborated the history with the patient.  SUMMARY OF ONCOLOGIC HISTORY: Oncology History   No history exists.     MEDICAL HISTORY:  Past Medical History:  Diagnosis Date   Abdominal hernia    Arthritis    Breast cancer ([place])    CAD (coronary artery disease)    Perioperative non-STEMI March, 2014  //   cardiac catheterization at time of non-STEMI, March, 2014, minor nonobstructive coronary disease, vigorous LV function, possibly vasospastic event versus stress  cardiomyopathy. No further workup of coronary disease   CHF (congestive heart failure) ([place])    Diverticulosis of colon 10/17/2017   Noted on colonoscopy 10/14/17   Ejection fraction    65-70%, vigorous function, echo, March, 2011   Ejection fraction    Vigorous function at time of catheterization October 06, 2012,   History of colonic polyps 10/17/2017   Colonoscopy 10/14/17   Hyperlipidemia    Hypertension    Internal hemorrhoids without complication 47/65/4650   Noted on colonoscopy 10/14/17   Lung nodule 12/2009   Very small, left upper lobe by CT June, 2011, one-year followup was stable   Myocardial infarction ([place]) 09/2013   OSA (obstructive sleep apnea)    CPAP   Overweight(278.02)    Pulmonary embolus ([place]) 01/16/2010   on Coumadin indefinitely   Shortness of breath    Shoulder pain    Sleep apnea    Warfarin anticoagulation     SURGICAL HISTORY: Past Surgical History:  Procedure Laterality Date   ABDOMINAL HYSTERECTOMY  07/2002   laparoscopic assisted vaginal hysterectomy for menorrhagia, dysmenorrhea, anemia, fibroids   BREAST BIOPSY Left 09/03/2021   COLOSTOMY     INCISIONAL HERNIA REPAIR N/A 09/27/2012   Procedure: LAPAROSCOPIC INCISIONAL HERNIA possible open;  Surgeon: Adin Hector, MD;  Location: [facility];  Service: General;  Laterality: N/A;   INSERTION OF MESH N/A 09/27/2012   Procedure: INSERTION OF MESH;  Surgeon: Adin Hector, MD;  Location: [facility];  Service: General;  Laterality: N/A;   KNEE ARTHROSCOPY Left    LEFT HEART CATHETERIZATION WITH CORONARY ANGIOGRAM N/A 10/06/2012   Procedure: LEFT HEART CATHETERIZATION WITH CORONARY ANGIOGRAM;  Surgeon: Sherren Mocha, MD;  Location: [facility]ATH [facility];  Service: Cardiovascular;  Laterality: N/A;    SOCIAL HISTORY: Social History   Socioeconomic History   Marital status: Single    Spouse name: Not on file   Number of children: 0   Years of education: 12   Highest education level: 10th grade  Occupational  History   Occupation: Disabled  Tobacco Use   Smoking status: Former    Types: Cigarettes    Quit date: 05/03/1988    Years since quitting: 33.3    Passive exposure: Never   Smokeless tobacco: Never  Vaping Use   Vaping Use: Never used  Substance and Sexual Activity   Alcohol use: Yes    Alcohol/week: 1.0 standard drink    Types: 1 Cans of beer per week    Comment: Beer sometimes.   Drug use: Never   Sexual activity: Not Currently  Other Topics Concern   Not on file  Social History Narrative   Financial assistance approved for 100% discount at [facility] and has [facility] card per [place]6/01/2010      Current Social History 02/11/2021        Patient lives with sister in a home  that is 1 story. There are 4 steps up to the entrance the patient uses. There is a railing.       Patient's method of transportation is personal car.      The highest level of education was some high school.      The patient currently is employed as aid on school bus.      Identified important Relationships are "my sister"       Pets : 0       Interests / Fun: "I like to fish and I love my job"       Current Stressors: "none"       Religious / Personal Beliefs: "I don't know"        Social Determinants of Radio broadcast assistant Strain: Not on file  Food Insecurity: No Food Insecurity   Worried About Charity fundraiser in the Last Year: Never true   [place] in the Last Year: Never true  Transportation Needs: No Transportation Needs   Lack of Transportation (Medical): No   Lack of Transportation (Non-Medical): No  Physical Activity: Not on file  Stress: No Stress Concern Present   Feeling of Stress : Not at all  Social Connections: Not on file  Intimate Partner Violence: Not At Risk   Fear of Current or Ex-Partner: No   Emotionally Abused: No   Physically Abused: No   Sexually Abused: No    FAMILY HISTORY: Family History  Problem Relation Age of Onset   Diabetes Mother     Ovarian cancer Mother    Diabetes Father    Diabetes Sister    Colon polyps Sister    Bone cancer Maternal Grandmother    Breast cancer Neg Hx    Colon cancer Neg Hx    Esophageal cancer Neg Hx    Rectal cancer Neg Hx    Stomach cancer Neg Hx     ALLERGIES:  has No Known Allergies.  MEDICATIONS:  Current Outpatient Medications  Medication Sig Dispense Refill   aspirin 81 MG chewable tablet Chew 1 tablet (81 mg total) by mouth daily. 30 tablet 1   carvedilol (COREG) 6.25 MG tablet TAKE 1 TABLET BY MOUTH TWICE DAILY WITH MEALS 180 tablet 3   Cholecalciferol (VITAMIN D)  50 MCG (2000 UT) CAPS Take 1 capsule by mouth daily.     diclofenac Sodium (VOLTAREN) 1 % GEL Apply 4g to knee four times a day as needed for pain, apply 2g to shoulder as needed for pain 350 g 3   furosemide (LASIX) 20 MG tablet Take 1 tablet (20 mg total) by mouth daily. 90 tablet 1   HYDROcodone-acetaminophen (NORCO) 10-325 MG tablet Take 1 tablet by mouth every 6 (six) hours as needed for severe pain. 100 tablet 0   Insulin Pen Needle (NOVOFINE) 32G X 6 MM MISC 1 Units by Does not apply route daily. 100 each 1   lidocaine (XYLOCAINE) 5 % ointment Apply 1 application topically 2 (two) times daily as needed. 150 g 1   olmesartan (BENICAR) 40 MG tablet Take 1 tablet (40 mg total) by mouth daily. 90 tablet 3   polyethylene glycol (MIRALAX / GLYCOLAX) packet Take 17 g by mouth daily.     rivaroxaban (XARELTO) 20 MG TABS tablet Take 1 tablet (20 mg total) by mouth daily with supper. 90 tablet 3   rosuvastatin (CRESTOR) 5 MG tablet Take 1 tablet (5 mg total) by mouth at bedtime. 90 tablet 3   Semaglutide, 2 MG/DOSE, 8 MG/3ML SOPN Inject 2 mg into the skin once a week. 9 mL 3   No current facility-administered medications for this visit.    REVIEW OF SYSTEMS:   Constitutional: Denies fevers, chills or abnormal night sweats Eyes: Denies blurriness of vision, double vision or watery eyes Ears, nose, mouth, throat, and  face: Denies mucositis or sore throat Respiratory: Denies cough, dyspnea or wheezes Cardiovascular: Denies palpitation, chest discomfort or lower extremity swelling Gastrointestinal:  Denies nausea, heartburn or change in bowel habits Skin: Denies abnormal skin rashes Lymphatics: Denies new lymphadenopathy or easy bruising Neurological:Denies numbness, tingling or new weaknesses Behavioral/Psych: Mood is stable, no new changes  Breast: Palpable left breast lump-ecchymosis reported. All other systems were reviewed with the patient and are negative.  PHYSICAL EXAMINATION: ECOG PERFORMANCE STATUS: 0 - Asymptomatic  Vitals:   09/11/21 0912  BP: (!) 167/74  Pulse: 75  Resp: 18  Temp: 97.7 F (36.5 C)  SpO2: 100%   Filed Weights   09/11/21 0912  Weight: 276 lb 4.8 oz (125.3 kg)    GENERAL:alert, no distress and comfortable, obese SKIN: skin color, texture, turgor are normal, no rashes or significant lesions EYES: normal, conjunctiva are pink and non-injected, sclera clear OROPHARYNX:no exudate, no erythema and lips, buccal mucosa, and tongue normal  NECK: supple, thyroid normal size, non-tender, without nodularity LYMPH:  no palpable lymphadenopathy in the cervical, axillary or inguinal LUNGS: clear to auscultation and percussion with normal breathing effort HEART: regular rate & rhythm and no murmurs and no lower extremity edema ABDOMEN:abdomen soft, non-tender and normal bowel sounds Musculoskeletal:no cyanosis of digits and no clubbing  PSYCH: alert & oriented x 3 with fluent speech NEURO: no focal motor/sensory deficits BREAST: Palpable left breast mass in the upper outer quadrant measuring under 2 cm on the largest dimension.  No palpable left axillary mass.  There is a small soft lump in the right cervical region which according to the patient has been evaluated in the past.  This did not appear pathologic  LABORATORY DATA:  I have reviewed the data as listed Lab Results   Component Value Date   WBC 6.3 09/11/2021   HGB 13.1 09/11/2021   HCT 40.8 09/11/2021   MCV 92.3 09/11/2021   PLT 264 09/11/2021  Lab Results  Component Value Date   NA 142 09/11/2021   K 4.1 09/11/2021   CL 105 09/11/2021   CO2 32 09/11/2021    RADIOGRAPHIC STUDIES: I have personally reviewed the radiological reports and agreed with the findings in the report.  ASSESSMENT AND PLAN:  Malignant neoplasm of upper-outer quadrant of left breast in female, estrogen receptor positive ([place]) This is a very pleasant 61 year old female patient with T1 N0 M0 grade 1-2, ER/PR positive, HER2 negative, KI less than 5% invasive ductal carcinoma referred to breast [place] for additional recommendations. We have discussed that given the small size of the tumor diagnosis, we will proceed with upfront surgery.  We have discussed about Oncotype as mentioned below.  We have discussed about Oncotype Dx score which is a well validated prognostic scoring system which can predict outcome with endocrine therapy alone and whether chemotherapy reduces recurrence.  Typically in patients with ER positive cancers that are node negative if the RS score is high typically greater than or equal to 26, chemotherapy is recommended.  In women with intermediate recurrence score younger than 71, there can still be some role for chemotherapy in addition to endocrine therapy especially if the recurrence score is between 21-25. If chemotherapy is needed, this will precede radiation and then after radiation she will continue on antiestrogen therapy.  We can consider Oncotype testing based on the final pathology.  It is very less likely that she may need adjuvant chemotherapy based on the prognostics reported so far. We have also discussed about antiestrogen therapy as mentioned below.  We have discussed options for antiestrogen therapy today  With regards to Tamoxifen, we discussed that this is a SERM, selective estrogen  receptor modulator. We discussed mechanism of action of Tamoxifen, adverse effects on Tamoxifen including but not limited to post menopausal symptoms, increased risk of DVT/PE, increased risk of endometrial cancer, questionable cataracts with long term use and increased risk of cardiovascular events in the study which was not statistically significant. A benefit from Tamoxifen would be improvement in bone density. With regards to aromatase inhibitors, we discussed mechanism of action, adverse effects including but not limited to post menopausal symptoms, arthralgias, myalgias, increased risk of cardiovascular events and bone loss.   She will return to clinic after surgery to discuss final pathology and any additional recommendations.  She is agreeable to all the recommendations as mentioned above.  I do not believe the right cervical swelling is related to the breast cancer, this feels are nonpathologic on exam.    All questions were answered. The patient knows to call the clinic with any problems, questions or concerns.    Benay Pike, MD 09/11/21

## 2021-09-11 ENCOUNTER — Encounter: Payer: Self-pay | Admitting: Physical Therapy

## 2021-09-11 ENCOUNTER — Other Ambulatory Visit: Payer: Self-pay | Admitting: General Surgery

## 2021-09-11 ENCOUNTER — Other Ambulatory Visit: Payer: Self-pay

## 2021-09-11 ENCOUNTER — Ambulatory Visit
Admission: RE | Admit: 2021-09-11 | Discharge: 2021-09-11 | Disposition: A | Discharge status: Home / Self Care | Payer: Medicare Other | Source: Ambulatory Visit | Attending: Radiation Oncology | Admitting: Radiation Oncology

## 2021-09-11 ENCOUNTER — Encounter: Payer: Self-pay | Admitting: Hematology and Oncology

## 2021-09-11 ENCOUNTER — Inpatient Hospital Stay: Payer: Medicare Other | Attending: Hematology and Oncology

## 2021-09-11 ENCOUNTER — Inpatient Hospital Stay (HOSPITAL_BASED_OUTPATIENT_CLINIC_OR_DEPARTMENT_OTHER): Payer: Medicare Other | Admitting: Hematology and Oncology

## 2021-09-11 ENCOUNTER — Inpatient Hospital Stay: Payer: Medicare Other | Admitting: Licensed Clinical Social Worker

## 2021-09-11 ENCOUNTER — Ambulatory Visit (HOSPITAL_BASED_OUTPATIENT_CLINIC_OR_DEPARTMENT_OTHER): Payer: Medicare Other | Admitting: Genetic Counselor

## 2021-09-11 ENCOUNTER — Telehealth: Payer: Self-pay | Admitting: *Deleted

## 2021-09-11 ENCOUNTER — Ambulatory Visit: Payer: Medicare Other | Attending: General Surgery | Admitting: Physical Therapy

## 2021-09-11 DIAGNOSIS — R293 Abnormal posture: Secondary | ICD-10-CM | POA: Diagnosis not present

## 2021-09-11 DIAGNOSIS — M25512 Pain in left shoulder: Secondary | ICD-10-CM

## 2021-09-11 DIAGNOSIS — C50212 Malignant neoplasm of upper-inner quadrant of left female breast: Secondary | ICD-10-CM | POA: Diagnosis not present

## 2021-09-11 DIAGNOSIS — C50412 Malignant neoplasm of upper-outer quadrant of left female breast: Secondary | ICD-10-CM

## 2021-09-11 DIAGNOSIS — Z170 Estrogen receptor positive status [ER+]: Secondary | ICD-10-CM

## 2021-09-11 DIAGNOSIS — Z8041 Family history of malignant neoplasm of ovary: Secondary | ICD-10-CM

## 2021-09-11 DIAGNOSIS — Z87891 Personal history of nicotine dependence: Secondary | ICD-10-CM | POA: Diagnosis not present

## 2021-09-11 DIAGNOSIS — G8929 Other chronic pain: Secondary | ICD-10-CM

## 2021-09-11 LAB — CBC WITH DIFFERENTIAL (CANCER CENTER ONLY)
Abs Immature Granulocytes: 0.02 10*3/uL (ref 0.00–0.07)
Basophils Absolute: 0 10*3/uL (ref 0.0–0.1)
Basophils Relative: 1 %
Eosinophils Absolute: 0.1 10*3/uL (ref 0.0–0.5)
Eosinophils Relative: 1 %
HCT: 40.8 % (ref 36.0–46.0)
Hemoglobin: 13.1 g/dL (ref 12.0–15.0)
Immature Granulocytes: 0 %
Lymphocytes Relative: 21 %
Lymphs Abs: 1.3 10*3/uL (ref 0.7–4.0)
MCH: 29.6 pg (ref 26.0–34.0)
MCHC: 32.1 g/dL (ref 30.0–36.0)
MCV: 92.3 fL (ref 80.0–100.0)
Monocytes Absolute: 0.6 10*3/uL (ref 0.1–1.0)
Monocytes Relative: 10 %
Neutro Abs: 4.3 10*3/uL (ref 1.7–7.7)
Neutrophils Relative %: 67 %
Platelet Count: 264 10*3/uL (ref 150–400)
RBC: 4.42 MIL/uL (ref 3.87–5.11)
RDW: 15.3 % (ref 11.5–15.5)
WBC Count: 6.3 10*3/uL (ref 4.0–10.5)
nRBC: 0 % (ref 0.0–0.2)

## 2021-09-11 LAB — CMP (CANCER CENTER ONLY)
ALT: 12 U/L (ref 0–44)
AST: 13 U/L — ABNORMAL LOW (ref 15–41)
Albumin: 4 g/dL (ref 3.5–5.0)
Alkaline Phosphatase: 49 U/L (ref 38–126)
Anion gap: 5 (ref 5–15)
BUN: 27 mg/dL — ABNORMAL HIGH (ref 8–23)
CO2: 32 mmol/L (ref 22–32)
Calcium: 10.1 mg/dL (ref 8.9–10.3)
Chloride: 105 mmol/L (ref 98–111)
Creatinine: 0.77 mg/dL (ref 0.44–1.00)
GFR, Estimated: >60 mL/min (ref >60)
Glucose, Bld: 87 mg/dL (ref 70–99)
Potassium: 4.1 mmol/L (ref 3.5–5.1)
Sodium: 142 mmol/L (ref 135–145)
Total Bilirubin: 0.4 mg/dL (ref 0.3–1.2)
Total Protein: 7.6 g/dL (ref 6.5–8.1)

## 2021-09-11 LAB — GENETIC SCREENING ORDER

## 2021-09-11 NOTE — Telephone Encounter (Addendum)
° °  Name: Breanna Wilkins  DOB: 1960-09-09  MRN: [id number]  Primary Cardiologist: Sinclair Grooms, MD  Chart reviewed as part of pre-operative protocol coverage. Because of Beyounce Dickens Moxey's past medical history and time since last visit, she will require a follow-up visit in order to better assess preoperative cardiovascular risk.  Last OV was >1 year ago in 2021.  Pre-op covering staff: - Please schedule appointment and call patient to inform them. - Please contact requesting surgeon's office via preferred method (i.e, phone, fax) to inform them of need for appointment prior to surgery. Please let surgeon know we do not prescribe her Xarelto. She is not on this for cardiac reasons so would recommend to reach out to prescribing provider for recommendations on holding. It appears she is on this for history of recurrent PE and hx of DVT with last prescriber Joni Reining.  Charlie Pitter, PA-C  09/11/2021, 3:52 PM

## 2021-09-11 NOTE — Assessment & Plan Note (Addendum)
This is a very pleasant 61 year old female patient with T1 N0 M0 grade 1-2, ER/PR positive, HER2 negative, KI less than 5% invasive ductal carcinoma referred to breast [facility] for additional recommendations. We have discussed that given the small size of the tumor diagnosis, we will proceed with upfront surgery.  We have discussed about Oncotype as mentioned below.  We have discussed about Oncotype Dx score which is a well validated prognostic scoring system which can predict outcome with endocrine therapy alone and whether chemotherapy reduces recurrence.  Typically in patients with ER positive cancers that are node negative if the RS score is high typically greater than or equal to 26, chemotherapy is recommended.  In women with intermediate recurrence score younger than 77, there can still be some role for chemotherapy in addition to endocrine therapy especially if the recurrence score is between 21-25. If chemotherapy is needed, this will precede radiation and then after radiation she will continue on antiestrogen therapy.  We can consider Oncotype testing based on the final pathology.  It is very less likely that she may need adjuvant chemotherapy based on the prognostics reported so far. We have also discussed about antiestrogen therapy as mentioned below.  We have discussed options for antiestrogen therapy today  With regards to Tamoxifen, we discussed that this is a SERM, selective estrogen receptor modulator. We discussed mechanism of action of Tamoxifen, adverse effects on Tamoxifen including but not limited to post menopausal symptoms, increased risk of DVT/PE, increased risk of endometrial cancer, questionable cataracts with long term use and increased risk of cardiovascular events in the study which was not statistically significant. A benefit from Tamoxifen would be improvement in bone density. With regards to aromatase inhibitors, we discussed mechanism of action, adverse effects including  but not limited to post menopausal symptoms, arthralgias, myalgias, increased risk of cardiovascular events and bone loss.   She will return to clinic after surgery to discuss final pathology and any additional recommendations.  She is agreeable to all the recommendations as mentioned above.  I do not believe the right cervical swelling is related to the breast cancer, this feels are nonpathologic on exam.

## 2021-09-11 NOTE — Therapy (Signed)
OUTPATIENT PHYSICAL THERAPY BREAST CANCER BASELINE EVALUATION   Patient Name: Breanna Wilkins MRN: [id number] DOB:Jul 05, 1961, 61 y.o., female Today's Date: 09/11/2021   PT End of Session - 09/11/21 1057     Visit Number 1    Number of Visits 2    Date for PT Re-Evaluation 11/06/21    PT Start Time 1012    PT Stop Time 1036    PT Time Calculation (min) 24 min    Activity Tolerance Patient tolerated treatment well    Behavior During Therapy [facility] for tasks assessed/performed             Past Medical History:  Diagnosis Date   Abdominal hernia    Arthritis    Breast cancer ([place])    CAD (coronary artery disease)    Perioperative non-STEMI March, 2014  //   cardiac catheterization at time of non-STEMI, March, 2014, minor nonobstructive coronary disease, vigorous LV function, possibly vasospastic event versus stress cardiomyopathy. No further workup of coronary disease   CHF (congestive heart failure) ([place])    Diverticulosis of colon 10/17/2017   Noted on colonoscopy 10/14/17   Ejection fraction    65-70%, vigorous function, echo, March, 2011   Ejection fraction    Vigorous function at time of catheterization October 06, 2012,   History of colonic polyps 10/17/2017   Colonoscopy 10/14/17   Hyperlipidemia    Hypertension    Internal hemorrhoids without complication 27/74/1287   Noted on colonoscopy 10/14/17   Lung nodule 12/2009   Very small, left upper lobe by CT June, 2011, one-year followup was stable   Myocardial infarction ([place]) 09/2013   OSA (obstructive sleep apnea)    CPAP   Overweight(278.02)    Pulmonary embolus ([place]) 01/16/2010   on Coumadin indefinitely   Shortness of breath    Shoulder pain    Sleep apnea    Warfarin anticoagulation    Past Surgical History:  Procedure Laterality Date   ABDOMINAL HYSTERECTOMY  07/2002   laparoscopic assisted vaginal hysterectomy for menorrhagia, dysmenorrhea, anemia, fibroids   BREAST BIOPSY Left 09/03/2021   COLOSTOMY      INCISIONAL HERNIA REPAIR N/A 09/27/2012   Procedure: LAPAROSCOPIC INCISIONAL HERNIA possible open;  Surgeon: Adin Hector, MD;  Location: [place];  Service: General;  Laterality: N/A;   INSERTION OF MESH N/A 09/27/2012   Procedure: INSERTION OF MESH;  Surgeon: Adin Hector, MD;  Location: [place];  Service: General;  Laterality: N/A;   KNEE ARTHROSCOPY Left    LEFT HEART CATHETERIZATION WITH CORONARY ANGIOGRAM N/A 10/06/2012   Procedure: LEFT HEART CATHETERIZATION WITH CORONARY ANGIOGRAM;  Surgeon: Sherren Mocha, MD;  Location: [facility] C[facility] LAB;  Service: Cardiovascular;  Laterality: N/A;   Patient Active Problem List   Diagnosis Date Noted   Malignant neoplasm of upper-outer quadrant of left breast in female, estrogen receptor positive ([place]) 09/10/2021   Neck mass 02/22/2021   Musculoskeletal neck pain 05/22/2020   Leg pain, posterior, left 12/21/2019   Strain of rectus abdominis muscle 09/06/2019   Sciatica of left side 11/10/2018   Meralgia paraesthetica, left 10/20/2018   Chronic fatigue 10/01/2018   Diverticulosis of colon 10/17/2017   Internal hemorrhoids without complication 86/76/7209   History of colonic polyps 10/17/2017   BRBPR (bright red blood per rectum) 08/25/2017   Chronic pain 07/10/2017   Long term (current) use of opiate analgesic 09/17/2016   Vitamin D deficiency 09/17/2016   Primary osteoarthritis of right knee 03/04/2016   Nipple dermatitis 11/20/2015  Prediabetes 06/07/2015   Morbid obesity with BMI of 40.0-44.9, adult ([place]) 06/07/2015   GERD (gastroesophageal reflux disease) 05/29/2014   Atypical chest pain 02/23/2014   Plantar fasciitis of right foot 02/07/2014   Carpal tunnel syndrome of right wrist 12/05/2013   Pain of left calf 11/03/2013   Preventative health care 05/12/2013   CHF NYHA class II (symptoms with moderately strenuous activities) ([place]) 10/11/2012   Hyperlipidemia 08/31/2012   History of DVT (deep vein thrombosis) 03/20/2011   Long  term (current) use of anticoagulants 08/09/2010   Supraspinatus tendon tear 02/11/2010   History of pulmonary embolism 01/16/2010   Severe obstructive sleep apnea 06/29/2009   Essential hypertension, benign 06/04/2009    PCP: Lucious Groves, DO  REFERRING PROVIDER: [name], MD  REFERRING DIAG: Left breast cancer  THERAPY DIAG:  Malignant neoplasm of upper-inner quadrant of left breast in female, estrogen receptor positive ([place])  Abnormal posture  Chronic left shoulder pain  ONSET DATE: 07/18/2021  SUBJECTIVE                                                                                                                                                                                           SUBJECTIVE STATEMENT: Patient reports she is here today to be seen by her medical team for her newly diagnosed left breast cancer.   PERTINENT HISTORY:  Patient was diagnosed on 07/18/2022 with left grade I-II invasive ductal carcinoma breast cancer. It measures 1.3 cm and is located in the upper inner quadrant. It is ER/PR positive and HER2 negative with a Ki67 of < 5%.   PATIENT GOALS   reduce lymphedema risk and learn post op HEP.   PAIN:  Are you having pain? No  PRECAUTIONS: Active CA   HAND DOMINANCE: right  WEIGHT BEARING RESTRICTIONS No  FALLS:  Has patient fallen in last 6 months? 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  LEISURE: She does not exercise  PRIOR LEVEL OF FUNCTION: Independent with community mobility with device   OBJECTIVE  COGNITION:  Overall cognitive status: Within functional limits for tasks assessed    POSTURE:  Forward head and rounded shoulders posture  UPPER EXTREMITY AROM/PROM:  A/PROM RIGHT  09/11/2021    Shoulder extension 53  Shoulder flexion 138  Shoulder abduction 144  Shoulder internal rotation 68  Shoulder external rotation 70    (Blank rows = not tested)  A/PROM LEFT  09/11/2021  Shoulder extension 51  Shoulder flexion 138  Shoulder abduction 127 and painful  Shoulder internal rotation 71  Shoulder external rotation 75    (Blank rows = not tested)   CERVICAL AROM: All within normal limits   UPPER EXTREMITY STRENGTH: WNL   LYMPHEDEMA ASSESSMENTS:   LANDMARK RIGHT  09/11/2021  10 cm proximal to olecranon process 38.8  Olecranon process 29.2  10 cm proximal to ulnar styloid process 23.7  Just proximal to ulnar styloid process 16.4  Across hand at thumb web space 19.5  At base of 2nd digit 6.7  (Blank rows = not tested)  LANDMARK LEFT  09/11/2021  10 cm proximal to olecranon process 37.7  Olecranon process 28.1  10 cm proximal to ulnar styloid process 22.4  Just proximal to ulnar styloid process 16  Across hand at thumb web space 19.2  At base of 2nd digit 6.3  (Blank rows = not tested)   L-DEX LYMPHEDEMA SCREENING:  The patient was assessed using the L-Dex machine today to produce a lymphedema index baseline score. The patient will be reassessed on a regular basis (typically every 3 months) to obtain new L-Dex scores. If the score is > 6.5 points away from his/her baseline score indicating onset of subclinical lymphedema, it will be recommended to wear a compression garment for 4 weeks, 12 hours per day and then be reassessed. If the score continues to be > 6.5 points from baseline at reassessment, we will initiate lymphedema treatment. Assessing in this manner has a 95% rate of preventing clinically significant lymphedema.   L-DEX FLOWSHEETS - 09/11/21 1000       L-DEX LYMPHEDEMA SCREENING   Measurement Type Unilateral    L-DEX MEASUREMENT EXTREMITY Upper Extremity    POSITION  Standing    DOMINANT SIDE Right    At Risk Side Left    BASELINE SCORE  (UNILATERAL) 6              QUICK DASH SURVEY:  Katina Dung - 09/11/21 0001     Open a tight or new jar No difficulty    Do heavy household chores (wash walls, wash floors) Severe difficulty    Carry a shopping bag or briefcase No difficulty    Wash your back Unable    Use a knife to cut food No difficulty    Recreational activities in which you take some force or impact through your arm, shoulder, or hand (golf, hammering, tennis) Unable    During the past week, to what extent has your arm, shoulder or hand problem interfered with your normal social activities with family, friends, neighbors, or groups? Quite a bit    During the past week, to what extent has your arm, shoulder or hand problem limited your work or other regular daily activities Not at all    Arm, shoulder, or hand pain. Mild    Tingling (pins and needles) in your arm, shoulder, or hand Severe    Difficulty Sleeping No difficulty    DASH Score 40.91 %              PATIENT EDUCATION:  Education details: Lymphedema risk reduction and post op shoulder/posture HEP Person educated: Patient Education method: Explanation, Demonstration, Handout Education comprehension: Patient verbalized understanding and returned demonstration   HOME EXERCISE PROGRAM: Patient was instructed today in a home exercise program today for post op shoulder range of motion. These included active assist shoulder flexion in sitting, scapular retraction, wall walking with shoulder  abduction, and hands behind head external rotation.  She was encouraged to do these twice a day, holding 3 seconds and repeating 5 times when permitted by her physician.   ASSESSMENT:  CLINICAL IMPRESSION: Patient was diagnosed on 07/18/2022 with left grade I-II invasive ductal carcinoma breast cancer. It measures 1.3 cm and is located in the upper inner quadrant. It is ER/PR positive and HER2 negative with a Ki67 of < 5%. Her multidisciplinary medical team met  prior to her assessments to determine a recommended treatment plan. She is planning to have a left lumpectomy and sentinel node biopsy followed by possible Oncotype testing, radiation, and anti-estrogen therapy. She will benefit from a post op PT reassessment to determine needs and from L-Dex screens every 3 months for 2 years to detect subclinical lymphedema.  Pt will benefit from skilled therapeutic intervention to improve on the following deficits: Decreased knowledge of precautions, impaired UE functional use, pain, decreased ROM, postural dysfunction.   PT treatment/interventions: ADL/self-care home management, pt/family education, therapeutic exercise  REHAB POTENTIAL: Good  CLINICAL DECISION MAKING: Stable/uncomplicated  EVALUATION COMPLEXITY: Low   GOALS: Goals reviewed with patient? YES  LONG TERM GOALS: (STG=LTG)   Name Target Date Goal status  1 Pt will be able to verbalize understanding of pertinent lymphedema risk reduction practices relevant to her dx specifically related to skin care.  Baseline:  No knowledge 09/11/2021 Achieved at eval  2 Pt will be able to return demo and/or verbalize understanding of the post op HEP related to regaining shoulder ROM. Baseline:  No knowledge 09/11/2021 Achieved at eval  3 Pt will be able to verbalize understanding of the importance of attending the post op After Breast CA Class for further lymphedema risk reduction education and therapeutic exercise.  Baseline:  No knowledge 09/11/2021 Achieved at eval  4 Pt will demo she has regained full shoulder ROM and function post operatively compared to baselines.  Baseline: See objective measurements taken today. 11/06/2021      PLAN: PT FREQUENCY/DURATION: EVAL and 1 follow up appointment.   PLAN FOR NEXT SESSION: will reassess 3-4 weeks post op to determine needs.   Patient will follow up at outpatient cancer rehab 3-4 weeks following surgery.  If the patient requires physical therapy at that  time, a specific plan will be dictated and sent to the referring physician for approval. The patient was educated today on appropriate basic range of motion exercises to begin post operatively and the importance of attending the After Breast Cancer class following surgery.  Patient was educated today on lymphedema risk reduction practices as it pertains to recommendations that will benefit the patient immediately following surgery.  She verbalized good understanding.    Physical Therapy Information for After Breast Cancer Surgery/Treatment:  Lymphedema is a swelling condition that you may be at risk for in your arm if you have lymph nodes removed from the armpit area.  After a sentinel node biopsy, the risk is approximately 5-9% and is higher after an axillary node dissection.  There is treatment available for this condition and it is not life-threatening.  Contact your physician or physical therapist with concerns. You may begin the 4 shoulder/posture exercises (see additional sheet) when permitted by your physician (typically a week after surgery).  If you have drains, you may need to wait until those are removed before beginning range of motion exercises.  A general recommendation is to not lift your arms above shoulder height until drains are removed.  These exercises should be  done to your tolerance and gently.  This is not a "no pain/no gain" type of recovery so listen to your body and stretch into the range of motion that you can tolerate, stopping if you have pain.  If you are having immediate reconstruction, ask your plastic surgeon about doing exercises as he or she may want you to wait. We encourage you to attend the free one time ABC (After Breast Cancer) class offered by [facility].  You will learn information related to lymphedema risk, prevention and treatment and additional exercises to regain mobility following surgery.  You can call [phone] for more information.   This is offered the 1st and 3rd Monday of each month.  You only attend the class one time. While undergoing any medical procedure or treatment, try to avoid blood pressure being taken or needle sticks from occurring on the arm on the side of cancer.   This recommendation begins after surgery and continues for the rest of your life.  This may help reduce your risk of getting lymphedema (swelling in your arm). An excellent resource for those seeking information on lymphedema is the National Lymphedema Network's web site. It can be accessed at [place] If you notice swelling in your hand, arm or breast at any time following surgery (even if it is many years from now), please contact your doctor or physical therapist to discuss this.  Lymphedema can be treated at any time but it is easier for you if it is treated early on.  If you feel like your shoulder motion is not returning to normal in a reasonable amount of time, please contact your surgeon or physical therapist.  [facility] [phone]. [address], [address], [place] [address]  ABC CLASS After Breast Cancer Class  After Breast Cancer Class is a specially designed exercise class to assist you in a safe recover after having breast cancer surgery.  In this class you will learn how to get back to full function whether your drains were just removed or if you had surgery a month ago.  This one-time class is held the 1st and 3rd Monday of every month from 11:00 a.m. until 12:00 noon virtually.  This class is FREE and space is limited. For more information or to register for the next available class, call [phone].  Class Goals  Understand specific stretches to improve the flexibility of you chest and shoulder. Learn ways to safely strengthen your upper body and improve your posture. Understand the warning signs of infection and why you may be at risk for an arm infection. Learn about Lymphedema  and prevention.  ** You do not attend this class until after surgery.  Drains must be removed to participate  Patient was instructed today in a home exercise program today for post op shoulder range of motion. These included active assist shoulder flexion in sitting, scapular retraction, wall walking with shoulder abduction, and hands behind head external rotation.  She was encouraged to do these twice a day, holding 3 seconds and repeating 5 times when permitted by her physician.  Annia Friendly, [place] 09/11/21 11:03 AM

## 2021-09-11 NOTE — Progress Notes (Signed)
[place] Work  Initial Assessment   Breanna Wilkins "Breanna Wilkins" is a 61 y.o. year old female accompanied by friend. Clinical Social Work was referred by  [facility]  for assessment of psychosocial needs.   SDOH (Social Determinants of Health) assessments performed: Yes SDOH Interventions    Flowsheet Row Most Recent Value  SDOH Interventions   Food Insecurity Interventions Intervention Not Indicated  Housing Interventions Intervention Not Indicated  Transportation Interventions Intervention Not Indicated       Distress Screen completed: Yes ONCBCN DISTRESS SCREENING 09/11/2021  Screening Type Initial Screening  Distress experienced in past week (1-10) 0      Family/Social Information:  Housing Arrangement: patient lives alone Family members/support persons in your life? 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 Income source: Employment Financial concerns: No Type of concern: None Food access concerns: no Services Currently in place:  n/a  Coping/ Adjustment to diagnosis: Patient understands treatment plan and what happens next? yes, feels calm with knowing that it was caught early and that she has good support from the team, her friends, family, and work. She is planning to do her radiation closer to home in [place] Concerns about diagnosis and/or treatment: I'm not especially worried about anything Patient reported stressors:  none Patient enjoys  her work Current coping skills/ strengths: Active sense of humor , Capable of independent living , Motivation for treatment/growth , and Supportive family/friends     SUMMARY: Current SDOH Barriers:  No significant SDOH concerns noted today  Clinical Social Work Clinical Goal(s):  None at this time  Interventions: Discussed common feeling and emotions when being diagnosed with cancer, and the importance of support during  treatment Informed patient of the support team roles and support services at [facility] Provided CSW contact information and encouraged patient to call with any questions or concerns   Follow Up Plan: Patient will contact CSW with any support or resource needs Patient verbalizes understanding of plan: Yes    Celvin Taney E Maisee Vollman, LCSW

## 2021-09-11 NOTE — Telephone Encounter (Signed)
° °  Pre-operative Risk Assessment    Patient Name: Breanna Wilkins  DOB: August 06, 1960 MRN: [id number]      Request for Surgical Clearance    Procedure:   LEFT BREAST LUMPECTOMY  Date of Surgery:  Clearance TBD                                 Surgeon:  DR. [name] Surgeon's Group or Practice Name:  [facility] Phone number:  [phone] Fax number:  [phone] ATTN: Emeline Gins, CMA   Type of Clearance Requested:   - Medical  - Pharmacy:  Hold Rivaroxaban (Xarelto)     Type of Anesthesia:  General    Additional requests/questions:    Jiles Prows   09/11/2021, 2:35 PM

## 2021-09-12 ENCOUNTER — Encounter: Payer: Self-pay | Admitting: *Deleted

## 2021-09-12 ENCOUNTER — Encounter: Payer: Self-pay | Admitting: Genetic Counselor

## 2021-09-12 DIAGNOSIS — Z8041 Family history of malignant neoplasm of ovary: Secondary | ICD-10-CM | POA: Insufficient documentation

## 2021-09-12 NOTE — Progress Notes (Signed)
REFERRING PROVIDER: Staci Acosta, MD [place], [place] [address]  PRIMARY PROVIDER:  Lucious Groves, DO  PRIMARY REASON FOR VISIT:  1. Malignant neoplasm of upper-outer quadrant of left breast in female, estrogen receptor positive ([place])   2. Family history of ovarian cancer     HISTORY OF PRESENT ILLNESS:   Breanna Wilkins, a 61 y.o. female, was seen for a [place] cancer genetics consultation during the breast multidisciplinary clinic at the request of Dr. Chryl Heck due to a personal and family history of cancer.  Breanna Wilkins presents to clinic today to discuss the possibility of a hereditary predisposition to cancer, to discuss genetic testing, and to further clarify her future cancer risks, as well as potential cancer risks for family members.   In February 2023, at the age of 64, Breanna Wilkins was diagnosed with invasive ductal carcinoma of the left breast.  CANCER HISTORY:  Oncology History   No history exists.   RISK FACTORS:  Menarche was at age 53.  First live birth at age 72.  OCP use for approximately 0 years.  Ovaries intact: no, believes her fallopian tubes are intact   Uterus intact: no.  Menopausal status: postmenopausal.  HRT use: 0 years. Colonoscopy: yes; 2 months ago. Mammogram within the last year: yes.  Past Medical History:  Diagnosis Date   Abdominal hernia    Arthritis    Breast cancer ([place])    CAD (coronary artery disease)    Perioperative non-STEMI March, 2014  //   cardiac catheterization at time of non-STEMI, March, 2014, minor nonobstructive coronary disease, vigorous LV function, possibly vasospastic event versus stress cardiomyopathy. No further workup of coronary disease   CHF (congestive heart failure) ([place])    Diverticulosis of colon 10/17/2017   Noted on colonoscopy 10/14/17   Ejection fraction    65-70%, vigorous function, echo, March, 2011   Ejection fraction    Vigorous function at time of catheterization October 06, 2012,   History of colonic polyps 10/17/2017   Colonoscopy 10/14/17   Hyperlipidemia    Hypertension    Internal hemorrhoids without complication 40/98/1191   Noted on colonoscopy 10/14/17   Lung nodule 12/2009   Very small, left upper lobe by CT June, 2011, one-year followup was stable   Myocardial infarction ([place]) 09/2013   OSA (obstructive sleep apnea)    CPAP   Overweight(278.02)    Pulmonary embolus ([place]) 01/16/2010   on Coumadin indefinitely   Shortness of breath    Shoulder pain    Sleep apnea    Warfarin anticoagulation     Past Surgical History:  Procedure Laterality Date   ABDOMINAL HYSTERECTOMY  07/2002   laparoscopic assisted vaginal hysterectomy for menorrhagia, dysmenorrhea, anemia, fibroids   BREAST BIOPSY Left 09/03/2021   COLOSTOMY     INCISIONAL HERNIA REPAIR N/A 09/27/2012   Procedure: LAPAROSCOPIC INCISIONAL HERNIA possible open;  Surgeon: Adin Hector, MD;  Location: [facility];  Service: General;  Laterality: N/A;   INSERTION OF MESH N/A 09/27/2012   Procedure: INSERTION OF MESH;  Surgeon: Adin Hector, MD;  Location: [facility];  Service: General;  Laterality: N/A;   KNEE ARTHROSCOPY Left    LEFT HEART CATHETERIZATION WITH CORONARY ANGIOGRAM N/A 10/06/2012   Procedure: LEFT HEART CATHETERIZATION WITH CORONARY ANGIOGRAM;  Surgeon: Sherren Mocha, MD;  Location: [facility] CATH LAB;  Service: Cardiovascular;  Laterality: N/A;    Social History   Socioeconomic History   Marital status: Single  Spouse name: Not on file   Number of children: 0   Years of education: 52   Highest education level: 10th grade  Occupational History   Occupation: Disabled  Tobacco Use   Smoking status: Former    Types: Cigarettes    Quit date: 05/03/1988    Years since quitting: 33.3    Passive exposure: Never   Smokeless tobacco: Never  Vaping Use   Vaping Use: Never used  Substance and Sexual Activity   Alcohol use: Yes    Alcohol/week: 1.0 standard drink    Types: 1 Cans  of beer per week    Comment: Beer sometimes.   Drug use: Never   Sexual activity: Not Currently  Other Topics Concern   Not on file  Social History Narrative   Financial assistance approved for 100% discount at [facility] and has [facility] card per [place]6/01/2010      Current Social History 02/11/2021        Patient lives with sister in a home  that is 1 story. There are 4 steps up to the entrance the patient uses. There is a railing.       Patient's method of transportation is personal car.      The highest level of education was some high school.      The patient currently is employed as aid on school bus.      Identified important Relationships are "my sister"       Pets : 0       Interests / Fun: "I like to fish and I love my job"       Current Stressors: "none"       Religious / Personal Beliefs: "I don't know"        Social Determinants of Radio broadcast assistant Strain: Not on file  Food Insecurity: No Food Insecurity   Worried About Charity fundraiser in the Last Year: Never true   [place] in the Last Year: Never true  Transportation Needs: No Transportation Needs   Lack of Transportation (Medical): No   Lack of Transportation (Non-Medical): No  Physical Activity: Not on file  Stress: No Stress Concern Present   Feeling of Stress : Not at all  Social Connections: Not on file     FAMILY HISTORY:  We obtained a detailed, 4-generation family history.  Significant diagnoses are listed below: Family History  Problem Relation Age of Onset   Diabetes Mother    Ovarian cancer Mother 31   Diabetes Father    Diabetes Sister    Colon polyps Sister    Brain cancer Maternal Aunt        unsure if it was a brain primary or cancer that metastasized to the brain   Cancer Paternal Uncle        unknown type   Bone cancer Maternal Grandmother        unsure if it was a bone primary or cancer that metastasized to the bones   Breast cancer Neg Hx    Colon cancer  Neg Hx    Esophageal cancer Neg Hx    Rectal cancer Neg Hx    Stomach cancer Neg Hx      Breanna Wilkins mother was diagnosed with ovarian cancer at age 81 and died at age 20. Her maternal aunt was diagnosed with brain cancer, she is unsure if the cancer was a brain primary or cancer that metastasized to the brain, she  is deceased. Her maternal grandmother was diagnosed with bone cancer, she is unsure if the cancer was a bone primary or cancer that metastasized to the bone, she is deceased. Breanna Wilkins paternal aunt was diagnosed with an unknown cancer, she is deceased. Breanna Wilkins is unaware of previous family history of genetic testing for hereditary cancer risks. There is no reported Ashkenazi Jewish ancestry.  GENETIC COUNSELING ASSESSMENT: Breanna Wilkins is a 61 y.o. female with a personal and family history of cancer which is somewhat suggestive of a hereditary cancer syndrome and predisposition to cancer. We, therefore, discussed and recommended the following at today's visit.   DISCUSSION: We discussed that 5 - 10% of cancer is hereditary, with most cases of hereditary breast cancer associated with mutations in BRCA1/2.  There are other genes that can be associated with hereditary breast and ovarian cancer syndromes. Type of cancer risk and level of risk are gene-specific. We discussed that testing is beneficial for several reasons including knowing how to follow individuals after completing their treatment, identifying whether potential treatment options would be beneficial, and understanding if other family members could be at risk for cancer and allowing them to undergo genetic testing.   We reviewed the characteristics, features and inheritance patterns of hereditary cancer syndromes. We also discussed genetic testing, including the appropriate family members to test, the process of testing, insurance coverage and turn-around-time for results. We discussed the implications of a negative,  positive and/or variant of uncertain significant result. In order to get genetic test results in a timely manner so that Breanna Wilkins can use these genetic test results for surgical decisions, we recommended Breanna Wilkins pursue genetic testing for the BRCAplus. Once complete, we recommend Breanna Wilkins pursue reflex genetic testing to a more comprehensive gene panel.   Breanna Wilkins  elected to have [facility] CancerNext-Expanded Panel+RNA. The CancerNext-Expanded gene panel offered by [facility] and includes sequencing, rearrangement, and RNA analysis for the following 77 genes: AIP, ALK, APC, ATM, AXIN2, BAP1, BARD1, BLM, BMPR1A, BRCA1, BRCA2, BRIP1, CDC73, CDH1, CDK4, CDKN1B, CDKN2A, CHEK2, CTNNA1, DICER1, FANCC, FH, FLCN, GALNT12, KIF1B, LZTR1, MAX, MEN1, MET, MLH1, MSH2, MSH3, MSH6, MUTYH, NBN, NF1, NF2, NTHL1, PALB2, PHOX2B, PMS2, POT1, PRKAR1A, PTCH1, PTEN, RAD51C, RAD51D, RB1, RECQL, RET, SDHA, SDHAF2, SDHB, SDHC, SDHD, SMAD4, SMARCA4, SMARCB1, SMARCE1, STK11, SUFU, TMEM127, TP53, TSC1, TSC2, VHL and XRCC2 (sequencing and deletion/duplication); EGFR, EGLN1, HOXB13, KIT, MITF, PDGFRA, POLD1, and POLE (sequencing only); EPCAM and GREM1 (deletion/duplication only).   Based on Breanna Wilkins personal and family history of cancer, she meets medical criteria for genetic testing. Despite that she meets criteria, she may still have an out of pocket cost. We discussed that if her out of pocket cost for testing is over $100, the laboratory should contact them to discuss self-pay prices, patient pay assistance programs, if applicable, and other billing options.   PLAN: After considering the risks, benefits, and limitations, Breanna Wilkins provided informed consent to pursue genetic testing and the blood sample was sent to [facility] for analysis of the CancerNext-Expanded Panel. Results should be available within approximately 1-2 weeks' time, at which point they will be disclosed by telephone to Breanna Wilkins, as  will any additional recommendations warranted by these results. Breanna Wilkins will receive a summary of her genetic counseling visit and a copy of her results once available. This information will also be available in Epic.   Breanna Wilkins questions were answered to her satisfaction today. Our contact information was provided should additional questions  or concerns arise. Thank you for the referral and allowing [place] to share in the care of your patient.   Lucille Passy, MS, [facility] Genetic Counselor [email or web address] (P) [phone]  The patient was seen for a total of 20 minutes in face-to-face genetic counseling.  The patient brought her friend. Daisy.  Drs. Lindi Adie and/or Burr Medico were available to discuss this case as needed.  _______________________________________________________________________ For Office Staff:  Number of people involved in session: 2 Was an Intern/ student involved with case: no

## 2021-09-12 NOTE — Telephone Encounter (Signed)
I s/w the pt and she has been scheduled for an appt for pre op clearance. Pt specifically stated she needed a time of 10:30-11 am. In accommodating the pt and finding an appt I ended up with March 27th @ 10:30 with Laurann Montana, NP at the [facility] location. Pt has been given the address and phone # for [place] location. I will forward notes to NP for upcoming appt. Will send FYI to requesting office pt has appt.

## 2021-09-13 NOTE — Telephone Encounter (Signed)
Patient on Xarelto due to history of DVT which is managed by primary care provider Dr. Joni Reining. Will route to Dr. Marlowe Aschoff office to make them aware that request to hold will need to be addressed by PCP.   Breanna Dubonnet, NP

## 2021-09-16 NOTE — Telephone Encounter (Signed)
Xarelto is prescribed for a history of recurrent VTE, she has not had any recent VTE events in the last few years.  Xarelto may be held 2 days prior to surgery and resumed 1 day post op if no significant bleeding.

## 2021-09-19 ENCOUNTER — Telehealth: Payer: Self-pay | Admitting: *Deleted

## 2021-09-19 ENCOUNTER — Encounter: Payer: Self-pay | Admitting: *Deleted

## 2021-09-19 NOTE — Telephone Encounter (Signed)
Spoke to pt concerning [place] from 2.22.23. Denies questions or concerns regarding dx or treatment care plan. Encourage pt to call with needs. Received verbal understanding. ?

## 2021-09-23 ENCOUNTER — Other Ambulatory Visit: Payer: Self-pay | Admitting: General Surgery

## 2021-09-23 DIAGNOSIS — Z170 Estrogen receptor positive status [ER+]: Secondary | ICD-10-CM

## 2021-09-23 DIAGNOSIS — C50412 Malignant neoplasm of upper-outer quadrant of left female breast: Secondary | ICD-10-CM

## 2021-09-24 ENCOUNTER — Encounter: Payer: Self-pay | Admitting: *Deleted

## 2021-09-24 ENCOUNTER — Other Ambulatory Visit: Payer: Self-pay

## 2021-09-24 DIAGNOSIS — C50412 Malignant neoplasm of upper-outer quadrant of left female breast: Secondary | ICD-10-CM

## 2021-09-24 DIAGNOSIS — Z170 Estrogen receptor positive status [ER+]: Secondary | ICD-10-CM

## 2021-09-25 ENCOUNTER — Telehealth: Payer: Self-pay | Admitting: Radiation Oncology

## 2021-09-25 NOTE — Progress Notes (Addendum)
Surgical Instructions ? ? ? Your procedure is scheduled on 10/03/21. ? Report to [facility] Main Entrance "A" at 11:30 A.M., then check in with the Admitting office. ? Call this number if you have problems the morning of surgery: ? [phone] ? ? If you have any questions prior to your surgery date call [phone]: Open Monday-Friday 8am-4pm ? ? ? Remember: ? Do not eat after midnight the night before your surgery ? ?You may drink clear liquids until 10:30 the morning of your surgery.   ?Clear liquids allowed are: Water, Non-Citrus Juices (without pulp), Carbonated Beverages, Clear Tea, Black Coffee ONLY (NO MILK, CREAM OR POWDERED CREAMER of any kind), and Gatorade ? ?Please complete your PRE-SURGERY ENSURE that was provided to you by 10:30 the morning of surgery.  Please, if able, drink it in one setting. DO NOT SIP.  ?  ? Take these medicines the morning of surgery with A SIP OF WATER:  ?carvedilol (COREG)  ?HYDROcodone-acetaminophen [facility]) - as needed ? ?Follow your surgeon's instructions on when to stop Xarelto.  If no instructions were given by your surgeon then you will need to call the office to get those instructions.    ? ? ?As of today, STOP taking any Aspirin (unless otherwise instructed by your surgeon) Aleve, Naproxen, Ibuprofen, Motrin, Advil, Goody's, BC's, all herbal medications, fish oil, and all vitamins. ? ?         ?Do not wear jewelry or makeup ?Do not wear lotions, powders, perfumes or deodorant. ?Do not shave 48 hours prior to surgery.   ?Do not bring valuables to the hospital. ?Do not wear nail polish, gel polish, artificial nails, or any other type of covering on natural nails (fingers and toes) ?If you have artificial nails or gel coating that need to be removed by a nail salon, please have this removed prior to surgery. Artificial nails or gel coating may interfere with anesthesia's ability to adequately monitor your vital signs. ? ?[facility] is not responsible for any belongings or  valuables. .  ? ?Do NOT Smoke (Tobacco/Vaping)  24 hours prior to your procedure ? ?If you use a CPAP at night, you may bring your mask for your overnight stay. ?  ?Contacts, glasses, hearing aids, dentures or partials may not be worn into surgery, please bring cases for these belongings ?  ?For patients admitted to the hospital, discharge time will be determined by your treatment team. ?  ?Patients discharged the day of surgery will not be allowed to drive home, and someone needs to stay with them for 24 hours. ? ?NO VISITORS WILL BE ALLOWED IN PRE-OP WHERE PATIENTS ARE PREPPED FOR SURGERY.  ONLY 1 SUPPORT PERSON MAY BE PRESENT IN THE WAITING ROOM WHILE YOU ARE IN SURGERY.  IF YOU ARE TO BE ADMITTED, ONCE YOU ARE IN YOUR ROOM YOU WILL BE ALLOWED TWO (2) VISITORS. 1 (ONE) VISITOR MAY STAY OVERNIGHT BUT MUST ARRIVE TO THE ROOM BY 8pm.  Minor children may have two parents present. Special consideration for safety and communication needs will be reviewed on a case by case basis. ? ?Special instructions:   ? ?Oral Hygiene is also important to reduce your risk of infection.  Remember - BRUSH YOUR TEETH THE MORNING OF SURGERY WITH YOUR REGULAR TOOTHPASTE ? ? ?[facility]- Preparing For Surgery ? ?Before surgery, you can play an important role. Because skin is not sterile, your skin needs to be as free of germs as possible. You can reduce the number  of germs on your skin by washing with CHG (chlorahexidine gluconate) Soap before surgery.  CHG is an antiseptic cleaner which kills germs and bonds with the skin to continue killing germs even after washing.   ? ? ?Please do not use if you have an allergy to CHG or antibacterial soaps. If your skin becomes reddened/irritated stop using the CHG.  ?Do not shave (including legs and underarms) for at least 48 hours prior to first CHG shower. It is OK to shave your face. ? ?Please follow these instructions carefully. ?  ? ? Shower the NIGHT BEFORE SURGERY and the MORNING OF SURGERY  with CHG Soap.  ? If you chose to wash your hair, wash your hair first as usual with your normal shampoo. After you shampoo, rinse your hair and body thoroughly to remove the shampoo.  Then [employer] and genitals (private parts) with your normal soap and rinse thoroughly to remove soap. ? ?After that Use CHG Soap as you would any other liquid soap. You can apply CHG directly to the skin and wash gently with a scrungie or a clean washcloth.  ? ?Apply the CHG Soap to your body ONLY FROM THE NECK DOWN.  Do not use on open wounds or open sores. Avoid contact with your eyes, ears, mouth and genitals (private parts). Wash Face and genitals (private parts)  with your normal soap.  ? ?Wash thoroughly, paying special attention to the area where your surgery will be performed. ? ?Thoroughly rinse your body with warm water from the neck down. ? ?DO NOT shower/wash with your normal soap after using and rinsing off the CHG Soap. ? ?Pat yourself dry with a CLEAN TOWEL. ? ?Wear CLEAN PAJAMAS to bed the night before surgery ? ?Place CLEAN SHEETS on your bed the night before your surgery ? ?DO NOT SLEEP WITH PETS. ? ? ?Day of Surgery: ? ?Take a shower with CHG soap. ?Wear Clean/Comfortable clothing the morning of surgery ?Do not apply any deodorants/lotions.   ?Remember to brush your teeth WITH YOUR REGULAR TOOTHPASTE. ? ? ? ? ?  ?Please read over the following fact sheets that you were given.   ?

## 2021-09-25 NOTE — Telephone Encounter (Signed)
Called patient to schedule a consultation w. Alison. No answer, LVM for a return call.  

## 2021-09-26 ENCOUNTER — Other Ambulatory Visit: Payer: Self-pay

## 2021-09-26 ENCOUNTER — Ambulatory Visit (INDEPENDENT_AMBULATORY_CARE_PROVIDER_SITE_OTHER): Payer: Medicare Other | Admitting: Internal Medicine

## 2021-09-26 ENCOUNTER — Inpatient Hospital Stay (HOSPITAL_COMMUNITY)
Admission: RE | Admit: 2021-09-26 | Discharge: 2021-09-26 | Disposition: A | Discharge status: Home / Self Care | Payer: Medicare Other | Source: Ambulatory Visit

## 2021-09-26 ENCOUNTER — Encounter: Payer: Self-pay | Admitting: Internal Medicine

## 2021-09-26 VITALS — BP 150/70 | HR 74 | Temp 97.6°F | Ht 67.0 in | Wt 279.7 lb

## 2021-09-26 DIAGNOSIS — Z7901 Long term (current) use of anticoagulants: Secondary | ICD-10-CM

## 2021-09-26 DIAGNOSIS — Z6841 Body Mass Index (BMI) 40.0 and over, adult: Secondary | ICD-10-CM

## 2021-09-26 DIAGNOSIS — I5022 Chronic systolic (congestive) heart failure: Secondary | ICD-10-CM

## 2021-09-26 DIAGNOSIS — I110 Hypertensive heart disease with heart failure: Secondary | ICD-10-CM | POA: Diagnosis not present

## 2021-09-26 DIAGNOSIS — Z79891 Long term (current) use of opiate analgesic: Secondary | ICD-10-CM

## 2021-09-26 DIAGNOSIS — M1711 Unilateral primary osteoarthritis, right knee: Secondary | ICD-10-CM

## 2021-09-26 DIAGNOSIS — Z01818 Encounter for other preprocedural examination: Secondary | ICD-10-CM

## 2021-09-26 DIAGNOSIS — I10 Essential (primary) hypertension: Secondary | ICD-10-CM

## 2021-09-26 DIAGNOSIS — G4733 Obstructive sleep apnea (adult) (pediatric): Secondary | ICD-10-CM

## 2021-09-26 DIAGNOSIS — G5711 Meralgia paresthetica, right lower limb: Secondary | ICD-10-CM

## 2021-09-26 MED ORDER — HYDROCODONE-ACETAMINOPHEN 10-325 MG PO TABS: 1.0000 | ORAL_TABLET | Freq: Four times a day (QID) | ORAL | 0 refills | Status: DC | PRN

## 2021-09-27 ENCOUNTER — Encounter: Payer: Self-pay | Admitting: Genetic Counselor

## 2021-09-27 ENCOUNTER — Telehealth: Payer: Self-pay | Admitting: Genetic Counselor

## 2021-09-27 DIAGNOSIS — Z1379 Encounter for other screening for genetic and chromosomal anomalies: Secondary | ICD-10-CM | POA: Insufficient documentation

## 2021-09-27 NOTE — Telephone Encounter (Signed)
I contacted Breanna Wilkins to discuss her genetic testing results. No pathogenic variants were identified in the 8 genes analyzed. Of note, a variant of uncertain significance was identified in the BRCA1 gene. We are still waiting on additional gene analysis and will contact her when they are available. Detailed clinic note to follow. ? ?The test report has been scanned into EPIC and is located under the Molecular Pathology section of the Results Review tab.  A portion of the result report is included below for reference.  ? ?Breanna Passy, MS, [place] ?Genetic Counselor ?[email or web address] ?(P) [phone] ? ? ?

## 2021-09-30 ENCOUNTER — Encounter (HOSPITAL_COMMUNITY)
Admission: RE | Admit: 2021-09-30 | Discharge: 2021-09-30 | Disposition: A | Discharge status: Home / Self Care | Payer: Medicare Other | Source: Ambulatory Visit | Attending: General Surgery | Admitting: General Surgery

## 2021-09-30 ENCOUNTER — Other Ambulatory Visit: Payer: Self-pay

## 2021-09-30 ENCOUNTER — Encounter (HOSPITAL_COMMUNITY): Payer: Self-pay

## 2021-09-30 ENCOUNTER — Encounter: Payer: Self-pay | Admitting: *Deleted

## 2021-09-30 DIAGNOSIS — G4733 Obstructive sleep apnea (adult) (pediatric): Secondary | ICD-10-CM | POA: Insufficient documentation

## 2021-09-30 DIAGNOSIS — Z01818 Encounter for other preprocedural examination: Secondary | ICD-10-CM | POA: Diagnosis present

## 2021-09-30 DIAGNOSIS — Z7901 Long term (current) use of anticoagulants: Secondary | ICD-10-CM | POA: Diagnosis not present

## 2021-09-30 DIAGNOSIS — Z86718 Personal history of other venous thrombosis and embolism: Secondary | ICD-10-CM | POA: Insufficient documentation

## 2021-09-30 DIAGNOSIS — I5032 Chronic diastolic (congestive) heart failure: Secondary | ICD-10-CM | POA: Diagnosis not present

## 2021-09-30 DIAGNOSIS — C50912 Malignant neoplasm of unspecified site of left female breast: Secondary | ICD-10-CM | POA: Diagnosis not present

## 2021-09-30 DIAGNOSIS — I2510 Atherosclerotic heart disease of native coronary artery without angina pectoris: Secondary | ICD-10-CM | POA: Diagnosis not present

## 2021-09-30 DIAGNOSIS — I110 Hypertensive heart disease with heart failure: Secondary | ICD-10-CM | POA: Insufficient documentation

## 2021-09-30 DIAGNOSIS — Z86711 Personal history of pulmonary embolism: Secondary | ICD-10-CM | POA: Diagnosis not present

## 2021-09-30 HISTORY — DX: Gastro-esophageal reflux disease without esophagitis: K21.9

## 2021-09-30 NOTE — Progress Notes (Signed)
PCP - Dr. Jerelene Redden ?Cardiologist - Dr. Daneen Schick ? ?PPM/ICD - n/a ?Device Orders - n/a ?Rep Notified - n/a ? ?Chest x-ray - n/a ?EKG - 09/30/21 ?Stress Test - denies ?ECHO - 05/21/16 ?Cardiac Cath - 09/26/12 ? ?Sleep Study - +OSA ?CPAP - Wears nightly. Pressure setting 7. ? ?Fasting Blood Sugar - n/a ?Checks Blood Sugar _____ times a day- n/a ? ?Blood Thinner Instructions: Patient states she was instructed to stop Xarelto 2 day prior to surgery.  ?Aspirin Instructions: As of today stop taking Aspirin unless otherwise instructed by your surgeon.  ? ?ERAS Protcol - Yes ?PRE-SURGERY Ensure or G2- Ensure ? ?COVID TEST- Ambulatory Surgery ? ? ?Anesthesia review: Yes. Cardiac History. Spoke to [place] at Dr. Marlowe Aschoff office who stated clearance has been received by Dr. Tamala Julian.  ? ?Patient denies shortness of breath, fever, cough and chest pain at PAT appointment ? ? ?All instructions explained to the patient, with a verbal understanding of the material. Patient agrees to go over the instructions while at home for a better understanding. The opportunity to ask questions was provided. ?  ?

## 2021-10-01 ENCOUNTER — Encounter: Payer: Self-pay | Admitting: *Deleted

## 2021-10-01 ENCOUNTER — Ambulatory Visit
Admission: RE | Admit: 2021-10-01 | Discharge: 2021-10-01 | Disposition: A | Discharge status: Home / Self Care | Payer: Medicare Other | Source: Ambulatory Visit | Attending: General Surgery | Admitting: General Surgery

## 2021-10-01 DIAGNOSIS — C50412 Malignant neoplasm of upper-outer quadrant of left female breast: Secondary | ICD-10-CM

## 2021-10-01 NOTE — Progress Notes (Signed)
Anesthesia Chart Review: ? ? Case: [id number] Date/Time: 10/03/21 1015  ? Procedure: LEFT BREAST LUMPECTOMY WITH RADIOACTIVE SEED AND SENTINEL LYMPH NODE BIOPSY (Left: Breast)  ? Anesthesia type: General  ? Pre-op diagnosis: LEFT BREAST CANCER  ? Location: [facility] ROOM 02 / [facility]  ? Surgeons: [name], MD  ? ?  ? ? ?DISCUSSION: ?Pt is 61 years old with hx CAD (mild, nonobstructive by 2014 cath), chronic diastolic CHF, HTN, PE (4010, on lifelong anticoagulation), DVT, OSA ? ?Pt to hold xarelto 2 days before surgery ? ? ?VS: BP (!) 144/75   Pulse 78   Temp 36.8 ?C (Oral)   Resp 17   Ht '5\' 7"'$  (1.702 m)   Wt 126.3 kg   SpO2 100%   BMI 43.62 kg/m?  ? ?PROVIDERS: ?- PCP is Lucious Groves, DO ?- Cardiologist is Daneen Schick, MD. Last office visit 2021.  ? ?LABS: Labs reviewed: Acceptable for surgery. ?- CBC w/diff 09/11/21: normal ?- CMP 09/11/21: BUN 27 ? ? ?EKG 09/30/21: NSR. Possible Left atrial enlargement ?Cannot rule out Anterior infarct, age undetermined.  ? ?CV: ?Echo 2017 ?- Left ventricle: The cavity size was normal. Wall thickness was increased in a pattern of mild LVH. Systolic function was normal. The estimated ejection fraction was in the range of 60% to 65%. Wall motion was normal; there were no regional wall motion abnormalities. Doppler parameters are consistent with abnormal left ventricular relaxation (grade 1 diastolic dysfunction).  ?- Pulmonary arteries: Systolic pressure was mildly increased. PA peak pressure: 32 mm Hg (S).  ?- Pericardium, extracardiac: A small pericardial effusion was identified. ? ?Cardiac cath 10/06/12:  ?- LM: widely patent, no obstructive disease ?- LCx: No significant stenosis ?- RCA: Large, dominant vessel with moderate tortuosity. Mild 20-30% stenosis in the mid vessel. PDA and PLA branches patent. ? ? ?Past Medical History:  ?Diagnosis Date  ? Abdominal hernia   ? Arthritis   ? Breast cancer ([place])   ? CAD (coronary artery disease)   ? Perioperative non-STEMI March, 2014   //   cardiac catheterization at time of non-STEMI, March, 2014, minor nonobstructive coronary disease, vigorous LV function, possibly vasospastic event versus stress cardiomyopathy. No further workup of coronary disease  ? CHF (congestive heart failure) ([place])   ? Diverticulosis of colon 10/17/2017  ? Noted on colonoscopy 10/14/17  ? Ejection fraction   ? 65-70%, vigorous function, echo, March, 2011  ? Ejection fraction   ? Vigorous function at time of catheterization October 06, 2012,  ? GERD (gastroesophageal reflux disease)   ? History of colonic polyps 10/17/2017  ? Colonoscopy 10/14/17  ? Hyperlipidemia   ? Hypertension   ? Internal hemorrhoids without complication 27/25/3664  ? Noted on colonoscopy 10/14/17  ? Lung nodule 12/2009  ? Very small, left upper lobe by CT June, 2011, one-year followup was stable  ? Myocardial infarction [facility]) 09/2013  ? OSA (obstructive sleep apnea)   ? CPAP  ? Overweight(278.02)   ? Pulmonary embolus ([place]) 01/16/2010  ? on Coumadin indefinitely  ? Shortness of breath   ? Shoulder pain   ? Sleep apnea   ? Warfarin anticoagulation   ? ? ?Past Surgical History:  ?Procedure Laterality Date  ? ABDOMINAL HYSTERECTOMY  07/2002  ? laparoscopic assisted vaginal hysterectomy for menorrhagia, dysmenorrhea, anemia, fibroids  ? BREAST BIOPSY Left 09/03/2021  ? COLOSTOMY    ? INCISIONAL HERNIA REPAIR N/A 09/27/2012  ? Procedure: LAPAROSCOPIC INCISIONAL HERNIA possible open;  Surgeon: Edsel Petrin  Dalbert Batman, MD;  Location: [place];  Service: General;  Laterality: N/A;  ? INSERTION OF MESH N/A 09/27/2012  ? Procedure: INSERTION OF MESH;  Surgeon: Adin Hector, MD;  Location: [place];  Service: General;  Laterality: N/A;  ? KNEE ARTHROSCOPY Left   ? LEFT HEART CATHETERIZATION WITH CORONARY ANGIOGRAM N/A 10/06/2012  ? Procedure: LEFT HEART CATHETERIZATION WITH CORONARY ANGIOGRAM;  Surgeon: Sherren Mocha, MD;  Location: [facility] CATH LAB;  Service: Cardiovascular;  Laterality: N/A;  ? ? ?MEDICATIONS: ? aspirin 81 MG  chewable tablet  ? carvedilol (COREG) 6.25 MG tablet  ? Cholecalciferol (VITAMIN D) 50 MCG (2000 UT) CAPS  ? diclofenac Sodium (VOLTAREN) 1 % GEL  ? furosemide (LASIX) 20 MG tablet  ? HYDROcodone-acetaminophen (NORCO) 10-325 MG tablet  ? [START ON 10/26/2021] HYDROcodone-acetaminophen (NORCO) 10-325 MG tablet  ? [START ON 11/25/2021] HYDROcodone-acetaminophen (NORCO) 10-325 MG tablet  ? Insulin Pen Needle (NOVOFINE) 32G X 6 MM MISC  ? lidocaine (XYLOCAINE) 5 % ointment  ? olmesartan (BENICAR) 40 MG tablet  ? polyethylene glycol (MIRALAX / GLYCOLAX) packet  ? rivaroxaban (XARELTO) 20 MG TABS tablet  ? rosuvastatin (CRESTOR) 5 MG tablet  ? Semaglutide, 2 MG/DOSE, 8 MG/3ML SOPN  ? ?No current facility-administered medications for this encounter.  ? ?- Pt to hold xarelto 2 days before surgery ? ? ?If no changes, I anticipate pt can proceed with surgery as scheduled.  ? ?Willeen Cass, PhD, FNP-BC ?[facility]/Anesthesiology ?Phone: [phone] ?10/01/2021 12:05 PM ? ? ? ? ?

## 2021-10-01 NOTE — Anesthesia Preprocedure Evaluation (Addendum)
Anesthesia Evaluation  ?Patient identified by MRN, date of birth, ID band ?Patient awake ? ? ? ?Reviewed: ?Allergy & Precautions, NPO status , Patient's Chart, lab work & pertinent test results ? ?Airway ?Mallampati: III ? ?TM Distance: >3 FB ?Neck ROM: Full ? ? ? Dental ?no notable dental hx. ? ?  ?Pulmonary ?shortness of breath, sleep apnea , former smoker,  ?  ?Pulmonary exam normal ?breath sounds clear to auscultation ? ? ? ? ? ? Cardiovascular ?Exercise Tolerance: Poor ?hypertension, + CAD, + Past MI and +CHF  ?Normal cardiovascular exam ?Rhythm:Regular Rate:Normal ? ?Normal sinus rhythm ?Possible Left atrial enlargement ?Cannot rule out Anterior infarct , age undetermined ?Abnormal ECG ?When compared with ECG of 23-Feb-2014 16:10, ?PREVIOUS ECG IS PRESENT ?Since last tracing Left axis deviation is new ?Confirmed by Larae Grooms [phone]) on 09/30/2021 10:27:34 PM ?  ?Neuro/Psych ?Seizures: numbness bilateral hands.   Neuromuscular disease negative psych ROS  ? GI/Hepatic ?Neg liver ROS, GERD  ,  ?Endo/Other  ?Morbid obesity ? Renal/GU ?negative Renal ROS  ?negative genitourinary ?  ?Musculoskeletal ? ?(+) Arthritis , Sciatica ?  ? Abdominal ?  ?Peds ?negative pediatric ROS ?(+)  Hematology ?negative hematology ROS ?(+)   ?Anesthesia Other Findings ? ? Reproductive/Obstetrics ?negative OB ROS ? ?  ? ? ? ? ? ? ? ? ? ? ? ? ? ?  ?  ? ? ? ? ? ? ? ?Anesthesia Physical ?Anesthesia Plan ? ?ASA: 3 ? ?Anesthesia Plan: General  ? ?Post-op Pain Management: Regional block*, Tylenol PO (pre-op)* and Minimal or no pain anticipated  ? ?Induction: Intravenous ? ?PONV Risk Score and Plan: 3 and Treatment may vary due to age or medical condition, Dexamethasone and Ondansetron ? ?Airway Management Planned: LMA ? ?Additional Equipment: None ? ?Intra-op Plan:  ? ?Post-operative Plan: Extubation in OR ? ?Informed Consent: I have reviewed the patients History and Physical, chart, labs and  discussed the procedure including the risks, benefits and alternatives for the proposed anesthesia with the patient or authorized representative who has indicated his/her understanding and acceptance.  ? ? ? ?Dental advisory given ? ?Plan Discussed with: CRNA, Anesthesiologist and Surgeon ? ?Anesthesia Plan Comments: (See APP note by Durel Salts, FNP )  ? ? ? ? ? ?Anesthesia Quick Evaluation ? ?

## 2021-10-02 DIAGNOSIS — Z01818 Encounter for other preprocedural examination: Secondary | ICD-10-CM | POA: Insufficient documentation

## 2021-10-02 NOTE — Assessment & Plan Note (Signed)
Her right knee has been bothering her a bit more also having some meralgia paresthetica symptoms.  We have been managing her chronic osteoarthritis of her right knee with intermittent steroid injections and opioid analgesics.  She is requesting a steroid injection today which I performed. ?

## 2021-10-02 NOTE — H&P (Signed)
?REFERRING PHYSICIAN: Dorise Bullion, MD ? ?PROVIDER: Georgianne Fick, MD ? ?Care Team: Patient Care Team: ?Lucious Groves, DO as PCP - General (Internal Medicine) ?Georgianne Fick, MD as Consulting Provider (Surgical Oncology) ?Marye Round, MD (Radiation Oncology) ?Iruku, Flo Shanks, MD (Hematology and Oncology)  ? ?MRN: [id number] ?DOB: [birth year] ?DATE OF ENCOUNTER: 09/11/2021 ? ?Subjective  ? ?Chief Complaint: Breast Cancer ? ? ?History of Present Illness: ?Breanna Wilkins is a 61 y.o. female who is seen today as an office consultation at the request of Dr. [name] [place] for evaluation of Breast Cancer ?Marland Kitchen  ?Pt has a new diagnosis of left breast cancer 08/2021. She presented with a screening detected left breast mass. Diagnostic imaging confirmed this and showed a 1.3 cm mass at 10 o'clock. Core needle biopsy was performed. This showed a grade 1-2 invasive ductal carcinoma, ER/PR positive, her 2 negative, Ki 67 5%. She hasn't had a personal history of cancer, but her mother had ovarian cancer. Menarche was age 58. She isn't sure when menopause was since she had a hysterectomy. She is nulliparous.  ? ?Of note, she has a history of a NSTEMI following a hernia repair in 2014. Cath showed non obstructive CAD. See Dr. Thompson Caul notes in [facility] epic (available in care everywhere). She is on xarelto for h/o PE.  ? ?Diagnostic mammogram/us :08/15/21 ?  ?ACR Breast Density Category c: The breast tissue is heterogeneously ?dense, which may obscure small masses. ?  ?FINDINGS: ?Within the upper-outer left breast posterior depth there is a ?persistent irregular mass, further evaluated with spot compression ?views. ?  ?Targeted ultrasound is performed, showing a 13 x 12 x 13 mm ?irregular hypoechoic mass left breast 2 o'clock position 10 cm from ?nipple. No left axillary adenopathy. ?  ?IMPRESSION: ?Suspicious left breast mass. ?  ?RECOMMENDATION: ?Ultrasound-guided core needle biopsy left breast mass. ?   ?I have discussed the findings and recommendations with the patient. ?If applicable, a reminder letter will be sent to the patient ?regarding the next appointment. ?  ?BI-RADS CATEGORY 4: Suspicious. ? ?Pathology core needle biopsy: 09/03/21 ?Breast, left, needle core biopsy, 2 o'clock, 10cmfn ?- INVASIVE MAMMARY CARCINOMA ?. Based on the biopsy, the carcinoma appears Nottingham grade 1-2 of 3 and measures 0.9 cm in greatest linear extent. ?E-cadherin is POSITIVE supporting a ductal origin. ? ?Receptors: ?Estrogen Receptor: 100%, POSITIVE, MODERATE STAINING INTENSITY ?Progesterone Receptor: 100%, POSITIVE, STRONG STAINING INTENSITY ?Proliferation Marker Ki67: <5% ?GROUP 5: HER2 **NEGATIVE** ? ?Review of Systems: ?A complete review of systems was obtained from the patient. I have reviewed this information and discussed as appropriate with the patient. See HPI as well for other ROS. ? ?Review of Systems  ?Cardiovascular: Positive for palpitations, orthopnea and leg swelling (occasional foot swelling).  ?Musculoskeletal: Positive for joint pain (right knee pain).  ?Endo/Heme/Allergies: Bruises/bleeds easily.  ?All other systems reviewed and are negative. ? ? ?Medical History: ?Past Medical History:  ?Diagnosis Date  ? Arthritis  ? CHF (congestive heart failure) (CMS-HCC)  ? DVT (deep venous thrombosis) (CMS-HCC)  ? History of cancer  ? Hyperlipidemia  ? Sleep apnea  ? ?Patient Active Problem List  ?Diagnosis  ? Malignant neoplasm of upper-outer quadrant of left breast in female, estrogen receptor positive (CMS-HCC)  ? Blood in urine  ? History of cancer  ? Family history of ovarian cancer  ? On continuous oral anticoagulation  ? ?Past Surgical History:  ?Procedure Laterality Date  ? HC BREAST BX LOC DEV W/STEREO GUIDE ADDL 09/03/2021  ?  HERNIA REPAIR  ? HYSTERECTOMY  ? ? ?No Known Allergies ? ?Current Outpatient Medications on File Prior to Visit  ?Medication Sig Dispense Refill  ? carvediloL (COREG) 6.25 MG tablet  Take 1 tablet by mouth 2 (two) times daily with meals  ? FUROsemide (LASIX) 20 MG tablet Take 20 mg by mouth once daily  ? HYDROcodone-acetaminophen (NORCO) 10-325 mg tablet Take by mouth  ? rosuvastatin (CRESTOR) 5 MG tablet rosuvastatin 5 mg tablet  ? olmesartan (BENICAR) 40 MG tablet Take 40 mg by mouth once daily  ? XARELTO 20 mg tablet Take 20 mg by mouth daily with dinner  ? ?No current facility-administered medications on file prior to visit.  ? ?Family History  ?Problem Relation Age of Onset  ? Diabetes Mother  ? Diabetes Father  ? Diabetes Sister  ? ? ?Social History  ? ?Tobacco Use  ?Smoking Status Former  ? Types: Cigarettes  ? Quit date: 1989  ? Years since quitting: 34.1  ?Smokeless Tobacco Never  ? ? ?Social History  ? ?Socioeconomic History  ? Marital status: Unknown  ?Tobacco Use  ? Smoking status: Former  ?Types: Cigarettes  ?Quit date: 1989  ?Years since quitting: 34.1  ? Smokeless tobacco: Never  ?Vaping Use  ? Vaping Use: Never used  ?Substance and Sexual Activity  ? Alcohol use: Yes  ?Comment: 1 glass per week  ? Drug use: Never  ? ?Objective:  ? ?Vitals:  ?09/11/21 1215  ?BP: (!) 167/76  ?Pulse: 75  ?Resp: 18  ?Temp: 36.5 ?C (97.7 ?F)  ?Weight: (!) 125.3 kg (276 lb 4.8 oz)  ?Height: 170.2 cm (5' 7" )  ? ?Body mass index is 43.27 kg/m?. ? ?Gen: No acute distress. Well nourished and well groomed.  ?Neurological: Alert and oriented to person, place, and time. Coordination normal.  ?Head: Normocephalic and atraumatic.  ?Eyes: Conjunctivae are normal. Pupils are equal, round, and reactive to light. No scleral icterus.  ?Neck: Normal range of motion. Neck supple. No tracheal deviation or thyromegaly present.  ?Cardiovascular: Normal rate, regular rhythm, normal heart sounds and intact distal pulses. Exam reveals no gallop and no friction rub. No murmur heard. ?Breast: faint bruising left breast. Ptotic bilaterally. No palpable masses. No nipple retraction or nipple discharge. No skin dimpling. No  palpable LAD. Right breast normal. Reasonably symmetric.  ?Respiratory: Effort normal. No respiratory distress. No chest wall tenderness. Breath sounds normal. No wheezes, rales or rhonchi.  ?GI: Soft. Bowel sounds are normal. The abdomen is soft and nontender. There is no rebound and no guarding.  ?Musculoskeletal: Normal range of motion. Extremities are nontender.  ?Lymphadenopathy: No cervical, preauricular, postauricular or axillary adenopathy is present ?Skin: Skin is warm and dry. No rash noted. No diaphoresis. No erythema. No pallor. No clubbing, cyanosis, or edema.  ?Psychiatric: Normal mood and affect. Behavior is normal. Judgment and thought content normal.  ? ?Labs ?CBC and CMET essentially normal 09/11/2021 ? ?Assessment and Plan:  ? ?Malignant neoplasm of upper-outer quadrant of left breast in female, estrogen receptor positive (CMS-HCC) ?Pt has a new diagnosis of a cT1cN0 left breast cancer that is ER +. She is a candidate for breast conservation with seed localized lumpectomy and sentinel node biopsy. ? ?This will be followed by possible oncotype/mammaprint, radiation, and antihormonal tx. ? ?The surgical procedure was described to the patient. I discussed the incision type and location and that we would need radiology involved on with a wire or seed marker and/or sentinel node.  ? ?The risks and  benefits of the procedure were described to the patient and she wishes to proceed.  ? ?We discussed the risks bleeding, infection, damage to other structures, need for further procedures/surgeries. We discussed the risk of seroma. The patient was advised if the area in the breast in cancer, we may need to go back to surgery for additional tissue to obtain negative margins or for a lymph node biopsy. The patient was advised that these are the most common complications, but that others can occur as well. They were advised against taking aspirin or other anti-inflammatory agents/blood thinners the week before  surgery.  ? ?Family history of ovarian cancer ?Pt getting genetic counseling and testing today.  ? ?On continuous oral anticoagulation ?I have contacted cardiology (Dr. Tamala Julian) regarding logistics of holding this

## 2021-10-02 NOTE — Assessment & Plan Note (Signed)
Stable and doing well on CPAP. ?

## 2021-10-02 NOTE — Assessment & Plan Note (Signed)
BMI today is 43 weight is stable since last time.  She was previously doing very well with Ozempic and ha had lost over 40 pounds.  Now with her new diagnosis of breast cancer she was somewhat concerned that Victoria may be related.  I discussed with her that there has not been any clear evidence linking Ozempic to breast cancer but that I understand her concern and it is certainly fine to remain off this. ?

## 2021-10-02 NOTE — Assessment & Plan Note (Signed)
Having symptoms of numbness and tingling in the right lateral thigh.  I discussed with her that the symptoms are consistent with meralgia paresthetica on the right and likely to resolve with time advised no tight fitting clothing around the waist and continued weight loss. ?

## 2021-10-02 NOTE — Assessment & Plan Note (Signed)
She has a history of multiple DVT/PE.  She has not had any recent VTE events in the last few years and has been stable on Xarelto.  She has upcoming lumpectomy with need for holding her anticoagulants.  I have given clearance to do this we will need to hold Xarelto 2 days prior and resume 1 day after. ?

## 2021-10-02 NOTE — Progress Notes (Signed)
?Subjective:  ?HPI: ?[name] is a 61 y.o. female who presents for f/u breast cancer, obesity, hx VTE ? ?Please see Assessment and Plan below for the status of her chronic medical problems. ? ?Objective:  ?Physical Exam: ?Vitals:  ? 09/26/21 1100  ?BP: (!) 150/70  ?Pulse: 74  ?Temp: 97.6 ?F (36.4 ?C)  ?TempSrc: Oral  ?SpO2: 100%  ?Weight: 279 lb 11.2 oz (126.9 kg)  ?Height: '5\' 7"'$  (1.702 m)  ? ?Body mass index is 43.81 kg/m?Marland Kitchen ?Physical Exam ?Vitals and nursing note reviewed.  ?Constitutional:   ?   Appearance: Normal appearance. She is obese.  ?Cardiovascular:  ?   Rate and Rhythm: Normal rate and regular rhythm.  ?   Pulses: Normal pulses.  ?   Heart sounds: Normal heart sounds.  ?Pulmonary:  ?   Effort: Pulmonary effort is normal.  ?   Breath sounds: Normal breath sounds. No wheezing, rhonchi or rales.  ?Musculoskeletal:  ?   Right lower leg: Edema (1+) present.  ?   Left lower leg: Edema (1+) present.  ?Neurological:  ?   Mental Status: She is alert.  ?Psychiatric:     ?   Mood and Affect: Mood normal.     ?   Behavior: Behavior normal.  ? ?Assessment & Plan:  ?See Encounters Tab for problem based charting. ? ?Medications Ordered ?Meds ordered this encounter  ?Medications  ? HYDROcodone-acetaminophen (NORCO) 10-325 MG tablet  ?  Sig: Take 1 tablet by mouth every 6 (six) hours as needed for severe pain.  ?  Dispense:  100 tablet  ?  Refill:  0  ?  Rx 1/3  ? HYDROcodone-acetaminophen (NORCO) 10-325 MG tablet  ?  Sig: Take 1 tablet by mouth every 6 (six) hours as needed for severe pain.  ?  Dispense:  100 tablet  ?  Refill:  0  ?  Rx 2/3  ? HYDROcodone-acetaminophen (NORCO) 10-325 MG tablet  ?  Sig: Take 1 tablet by mouth every 6 (six) hours as needed for severe pain.  ?  Dispense:  100 tablet  ?  Refill:  0  ?  Rx 3/3  ? ?Other Orders ?No orders of the defined types were placed in this encounter. ? ?Follow Up: ?Return in about 3 months (around 12/27/2021). ? ?PROCEDURE NOTE ? ?PROCEDURE: right knee joint  steroid injection. ? ?PREOPERATIVE DIAGNOSIS: Osteoarthritis of the right knee. ? ?POSTOPERATIVE DIAGNOSIS: Osteoarthritis of the right knee. ? ?PROCEDURE: The patient was apprised of the risks and the benefits of the procedure and informed consent was obtained, as witnessed by [facility]. Time-out procedure was performed, with confirmation of the patient's name, date of birth, and correct identification of the right knee to be injected. The patient's knee was then marked at the appropriate site for injection placement. The knee was sterilely prepped with Betadine. A 40 mg (1 milliliter) solution of Kenalog was drawn up into a 5 mL syringe with a 2 mL of 1% lidocaine. The patient was injected with a 25-gauge needle at the anterior medial aspect of her right flexed knee. There were no complications. The patient tolerated the procedure well. There was minimal bleeding. The patient was instructed to ice her knee upon leaving clinic and refrain from overuse over the next 3 days. The patient was instructed to go to the emergency room with any usual pain, swelling, or redness occurred in the injected area. The patient was given a followup appointment to evaluate response to the injection to his  increased range of motion and reduction of pain. ? ? ?

## 2021-10-02 NOTE — Assessment & Plan Note (Signed)
Blood pressure mildly elevated at 150/70 today.  She is adherent to carvedilol 6.25 mg twice daily and olmesartan 40 mg daily.  Weight is stable at 280.  No changes in current medications. ?

## 2021-10-02 NOTE — Assessment & Plan Note (Signed)
She remains on opioid analgesics for chronic pain of bilateral knees back and shoulders.  She has been on stable dose with no red flags.  She has an upcoming lumpectomy and may need some additional pain control after the surgery. ?

## 2021-10-02 NOTE — Assessment & Plan Note (Signed)
She is medically optimized and stable for her upcoming lumpectomy.  I have given her instructions to hold Xarelto 2 days prior and resume 1 day after surgery.  Cardiac specific clearance was requested by general surgery to cardiology.  She has no current chest pain and overall appears euvolemic by my exam however is somewhat limited in her activities. ?

## 2021-10-02 NOTE — Assessment & Plan Note (Signed)
Weight remains stable.  Exam she appears overall euvolemic.  Does become shortness of breath with strenuous walking part of this may be due to her morbid obesity as she appears clinically euvolemic.  Preparing for upcoming lumpectomy.  Cardiac clearance was separately requested by general surgery to cardiology. ?

## 2021-10-03 ENCOUNTER — Encounter (HOSPITAL_COMMUNITY): Payer: Self-pay | Admitting: General Surgery

## 2021-10-03 ENCOUNTER — Ambulatory Visit (HOSPITAL_COMMUNITY)
Admission: RE | Admit: 2021-10-03 | Discharge: 2021-10-03 | Disposition: A | Discharge status: Home / Self Care | Payer: Medicare Other | Attending: General Surgery | Admitting: General Surgery

## 2021-10-03 ENCOUNTER — Ambulatory Visit (HOSPITAL_COMMUNITY): Payer: Medicare Other | Admitting: Emergency Medicine

## 2021-10-03 ENCOUNTER — Other Ambulatory Visit: Payer: Self-pay

## 2021-10-03 ENCOUNTER — Ambulatory Visit (HOSPITAL_BASED_OUTPATIENT_CLINIC_OR_DEPARTMENT_OTHER): Payer: Medicare Other | Admitting: Anesthesiology

## 2021-10-03 ENCOUNTER — Ambulatory Visit
Admission: RE | Admit: 2021-10-03 | Discharge: 2021-10-03 | Disposition: A | Discharge status: Home / Self Care | Payer: Medicare Other | Source: Ambulatory Visit | Attending: General Surgery | Admitting: General Surgery

## 2021-10-03 ENCOUNTER — Encounter (HOSPITAL_COMMUNITY)
Admission: RE | Disposition: A | Discharge status: Home / Self Care | Payer: Self-pay | Source: Home / Self Care | Attending: General Surgery

## 2021-10-03 DIAGNOSIS — Z90710 Acquired absence of both cervix and uterus: Secondary | ICD-10-CM | POA: Diagnosis not present

## 2021-10-03 DIAGNOSIS — C50412 Malignant neoplasm of upper-outer quadrant of left female breast: Secondary | ICD-10-CM | POA: Diagnosis present

## 2021-10-03 DIAGNOSIS — I252 Old myocardial infarction: Secondary | ICD-10-CM | POA: Insufficient documentation

## 2021-10-03 DIAGNOSIS — Z86711 Personal history of pulmonary embolism: Secondary | ICD-10-CM | POA: Diagnosis not present

## 2021-10-03 DIAGNOSIS — Z170 Estrogen receptor positive status [ER+]: Secondary | ICD-10-CM | POA: Diagnosis not present

## 2021-10-03 DIAGNOSIS — R569 Unspecified convulsions: Secondary | ICD-10-CM | POA: Diagnosis not present

## 2021-10-03 DIAGNOSIS — I509 Heart failure, unspecified: Secondary | ICD-10-CM | POA: Diagnosis not present

## 2021-10-03 DIAGNOSIS — I2510 Atherosclerotic heart disease of native coronary artery without angina pectoris: Secondary | ICD-10-CM

## 2021-10-03 DIAGNOSIS — K219 Gastro-esophageal reflux disease without esophagitis: Secondary | ICD-10-CM | POA: Diagnosis not present

## 2021-10-03 DIAGNOSIS — Z8041 Family history of malignant neoplasm of ovary: Secondary | ICD-10-CM | POA: Diagnosis not present

## 2021-10-03 DIAGNOSIS — I110 Hypertensive heart disease with heart failure: Secondary | ICD-10-CM | POA: Insufficient documentation

## 2021-10-03 DIAGNOSIS — G4730 Sleep apnea, unspecified: Secondary | ICD-10-CM | POA: Diagnosis not present

## 2021-10-03 DIAGNOSIS — Z7901 Long term (current) use of anticoagulants: Secondary | ICD-10-CM | POA: Insufficient documentation

## 2021-10-03 DIAGNOSIS — Z6841 Body Mass Index (BMI) 40.0 and over, adult: Secondary | ICD-10-CM | POA: Insufficient documentation

## 2021-10-03 DIAGNOSIS — Z87891 Personal history of nicotine dependence: Secondary | ICD-10-CM

## 2021-10-03 HISTORY — PX: BREAST LUMPECTOMY WITH RADIOACTIVE SEED AND SENTINEL LYMPH NODE BIOPSY: SHX6550

## 2021-10-03 SURGERY — BREAST LUMPECTOMY WITH RADIOACTIVE SEED AND SENTINEL LYMPH NODE BIOPSY
Anesthesia: General | Site: Breast | Laterality: Left

## 2021-10-03 MED ORDER — BUPIVACAINE LIPOSOME 1.3 % IJ SUSP
INTRAMUSCULAR | Status: DC | PRN
  Administered 2021-10-03: 10 mL

## 2021-10-03 MED ORDER — OXYCODONE HCL 5 MG/5ML PO SOLN: 5.0000 mg | Freq: Once | ORAL | Status: DC | PRN

## 2021-10-03 MED ORDER — DEXAMETHASONE SODIUM PHOSPHATE 10 MG/ML IJ SOLN
INTRAMUSCULAR | Status: AC
  Filled 2021-10-03: qty 1

## 2021-10-03 MED ORDER — ENSURE PRE-SURGERY PO LIQD: 296.0000 mL | Freq: Once | ORAL | Status: DC

## 2021-10-03 MED ORDER — CEFAZOLIN IN SODIUM CHLORIDE 3-0.9 GM/100ML-% IV SOLN
3.0000 g | INTRAVENOUS | Status: AC
  Administered 2021-10-03: 3 g via INTRAVENOUS
  Filled 2021-10-03: qty 100

## 2021-10-03 MED ORDER — ONDANSETRON HCL 4 MG/2ML IJ SOLN: 4.0000 mg | Freq: Once | INTRAMUSCULAR | Status: DC | PRN

## 2021-10-03 MED ORDER — AMISULPRIDE (ANTIEMETIC) 5 MG/2ML IV SOLN: 10.0000 mg | Freq: Once | INTRAVENOUS | Status: DC | PRN

## 2021-10-03 MED ORDER — ONDANSETRON HCL 4 MG/2ML IJ SOLN
INTRAMUSCULAR | Status: DC | PRN
  Administered 2021-10-03: 4 mg via INTRAVENOUS

## 2021-10-03 MED ORDER — LIDOCAINE HCL 1 % IJ SOLN
INTRAMUSCULAR | Status: DC | PRN
  Administered 2021-10-03: 10 mL via INTRADERMAL

## 2021-10-03 MED ORDER — OXYCODONE HCL 5 MG PO TABS: 5.0000 mg | ORAL_TABLET | Freq: Once | ORAL | Status: DC | PRN

## 2021-10-03 MED ORDER — ACETAMINOPHEN 500 MG PO TABS: 1000.0000 mg | ORAL_TABLET | ORAL | Status: AC

## 2021-10-03 MED ORDER — MIDAZOLAM HCL 2 MG/2ML IJ SOLN: 1.0000 mg | Freq: Once | INTRAMUSCULAR | Status: AC

## 2021-10-03 MED ORDER — FENTANYL CITRATE (PF) 100 MCG/2ML IJ SOLN: 50.0000 ug | Freq: Once | INTRAMUSCULAR | Status: AC

## 2021-10-03 MED ORDER — ONDANSETRON HCL 4 MG/2ML IJ SOLN
INTRAMUSCULAR | Status: AC
  Filled 2021-10-03: qty 2

## 2021-10-03 MED ORDER — CHLORHEXIDINE GLUCONATE 0.12 % MT SOLN: 15.0000 mL | Freq: Once | OROMUCOSAL | Status: AC

## 2021-10-03 MED ORDER — PROPOFOL 10 MG/ML IV BOLUS
INTRAVENOUS | Status: DC | PRN
  Administered 2021-10-03: 180 mg via INTRAVENOUS
  Administered 2021-10-03: 10 mg via INTRAVENOUS

## 2021-10-03 MED ORDER — PHENYLEPHRINE 40 MCG/ML (10ML) SYRINGE FOR IV PUSH (FOR BLOOD PRESSURE SUPPORT)
PREFILLED_SYRINGE | INTRAVENOUS | Status: DC | PRN
  Administered 2021-10-03: 80 ug via INTRAVENOUS

## 2021-10-03 MED ORDER — PROPOFOL 10 MG/ML IV BOLUS
INTRAVENOUS | Status: AC
  Filled 2021-10-03: qty 20

## 2021-10-03 MED ORDER — DEXAMETHASONE SODIUM PHOSPHATE 10 MG/ML IJ SOLN
INTRAMUSCULAR | Status: DC | PRN
  Administered 2021-10-03: 10 mg via INTRAVENOUS

## 2021-10-03 MED ORDER — CHLORHEXIDINE GLUCONATE 0.12 % MT SOLN
OROMUCOSAL | Status: AC
  Administered 2021-10-03: 15 mL via OROMUCOSAL
  Filled 2021-10-03: qty 15

## 2021-10-03 MED ORDER — FENTANYL CITRATE (PF) 250 MCG/5ML IJ SOLN
INTRAMUSCULAR | Status: AC
  Filled 2021-10-03: qty 5

## 2021-10-03 MED ORDER — BUPIVACAINE HCL (PF) 0.5 % IJ SOLN
INTRAMUSCULAR | Status: DC | PRN
  Administered 2021-10-03: 20 mL

## 2021-10-03 MED ORDER — LIDOCAINE HCL (PF) 1 % IJ SOLN
INTRAMUSCULAR | Status: AC
  Filled 2021-10-03: qty 30

## 2021-10-03 MED ORDER — MAGTRACE LYMPHATIC TRACER
INTRAMUSCULAR | Status: DC | PRN
  Administered 2021-10-03: 2 mL via INTRAMUSCULAR

## 2021-10-03 MED ORDER — CHLORHEXIDINE GLUCONATE CLOTH 2 % EX PADS: 6.0000 | MEDICATED_PAD | Freq: Once | CUTANEOUS | Status: DC

## 2021-10-03 MED ORDER — FENTANYL CITRATE (PF) 100 MCG/2ML IJ SOLN
INTRAMUSCULAR | Status: AC
  Administered 2021-10-03: 50 ug via INTRAVENOUS
  Filled 2021-10-03: qty 2

## 2021-10-03 MED ORDER — OXYCODONE HCL 5 MG PO TABS: 5.0000 mg | ORAL_TABLET | Freq: Four times a day (QID) | ORAL | 0 refills | Status: DC | PRN

## 2021-10-03 MED ORDER — LIDOCAINE 2% (20 MG/ML) 5 ML SYRINGE
INTRAMUSCULAR | Status: DC | PRN
  Administered 2021-10-03: 80 mg via INTRAVENOUS
  Administered 2021-10-03: 20 mg via INTRAVENOUS

## 2021-10-03 MED ORDER — FENTANYL CITRATE (PF) 100 MCG/2ML IJ SOLN: 25.0000 ug | INTRAMUSCULAR | Status: DC | PRN

## 2021-10-03 MED ORDER — PHENYLEPHRINE 40 MCG/ML (10ML) SYRINGE FOR IV PUSH (FOR BLOOD PRESSURE SUPPORT)
PREFILLED_SYRINGE | INTRAVENOUS | Status: AC
  Filled 2021-10-03: qty 10

## 2021-10-03 MED ORDER — ACETAMINOPHEN 500 MG PO TABS
ORAL_TABLET | ORAL | Status: AC
  Administered 2021-10-03: 1000 mg via ORAL
  Filled 2021-10-03: qty 2

## 2021-10-03 MED ORDER — ORAL CARE MOUTH RINSE: 15.0000 mL | Freq: Once | OROMUCOSAL | Status: AC

## 2021-10-03 MED ORDER — LACTATED RINGERS IV SOLN: INTRAVENOUS | Status: DC

## 2021-10-03 MED ORDER — LIDOCAINE 2% (20 MG/ML) 5 ML SYRINGE
INTRAMUSCULAR | Status: AC
  Filled 2021-10-03: qty 5

## 2021-10-03 MED ORDER — BUPIVACAINE-EPINEPHRINE (PF) 0.25% -1:200000 IJ SOLN
INTRAMUSCULAR | Status: AC
  Filled 2021-10-03: qty 30

## 2021-10-03 MED ORDER — MIDAZOLAM HCL 2 MG/2ML IJ SOLN
INTRAMUSCULAR | Status: DC | PRN
  Administered 2021-10-03 (×2): 1 mg via INTRAVENOUS

## 2021-10-03 MED ORDER — MIDAZOLAM HCL 2 MG/2ML IJ SOLN
INTRAMUSCULAR | Status: AC
  Filled 2021-10-03: qty 2

## 2021-10-03 MED ORDER — MIDAZOLAM HCL 2 MG/2ML IJ SOLN
INTRAMUSCULAR | Status: AC
  Administered 2021-10-03: 1 mg via INTRAVENOUS
  Filled 2021-10-03: qty 2

## 2021-10-03 MED ORDER — CEFAZOLIN SODIUM-DEXTROSE 2-4 GM/100ML-% IV SOLN
INTRAVENOUS | Status: AC
  Filled 2021-10-03: qty 100

## 2021-10-03 SURGICAL SUPPLY — 40 items
BAG COUNTER SPONGE SURGICOUNT (BAG) ×2 IMPLANT
BINDER BREAST 3XL (GAUZE/BANDAGES/DRESSINGS) ×1 IMPLANT
BNDG COHESIVE 4X5 TAN STRL (GAUZE/BANDAGES/DRESSINGS) ×2 IMPLANT
CANISTER SUCT 3000ML PPV (MISCELLANEOUS) ×2 IMPLANT
CHLORAPREP W/TINT 26 (MISCELLANEOUS) ×2 IMPLANT
CLIP TI LARGE 6 (CLIP) ×2 IMPLANT
CLIP TI MEDIUM 24 (CLIP) ×2 IMPLANT
COVER PROBE W GEL 5X96 (DRAPES) ×2 IMPLANT
COVER SURGICAL LIGHT HANDLE (MISCELLANEOUS) ×2 IMPLANT
DERMABOND ADVANCED (GAUZE/BANDAGES/DRESSINGS) ×1
DERMABOND ADVANCED .7 DNX12 (GAUZE/BANDAGES/DRESSINGS) ×1 IMPLANT
DEVICE DUBIN SPECIMEN MAMMOGRA (MISCELLANEOUS) ×1 IMPLANT
DRAPE CHEST BREAST 15X10 FENES (DRAPES) ×2 IMPLANT
DRAPE SURG 17X23 STRL (DRAPES) ×1 IMPLANT
ELECT COATED BLADE 2.86 ST (ELECTRODE) ×2 IMPLANT
ELECT REM PT RETURN 9FT ADLT (ELECTROSURGICAL) ×2
ELECTRODE REM PT RTRN 9FT ADLT (ELECTROSURGICAL) ×1 IMPLANT
GLOVE SURG ENC MOIS LTX SZ6 (GLOVE) ×2 IMPLANT
GLOVE SURG UNDER LTX SZ6.5 (GLOVE) ×2 IMPLANT
GOWN STRL REUS W/ TWL LRG LVL3 (GOWN DISPOSABLE) ×1 IMPLANT
GOWN STRL REUS W/TWL 2XL LVL3 (GOWN DISPOSABLE) ×2 IMPLANT
GOWN STRL REUS W/TWL LRG LVL3 (GOWN DISPOSABLE) ×2
KIT BASIN OR (CUSTOM PROCEDURE TRAY) ×2 IMPLANT
KIT MARKER MARGIN INK (KITS) ×2 IMPLANT
LIGHT WAVEGUIDE WIDE FLAT (MISCELLANEOUS) ×1 IMPLANT
NDL 18GX1X1/2 (RX/OR ONLY) (NEEDLE) IMPLANT
NDL HYPO 25GX1X1/2 BEV (NEEDLE) ×1 IMPLANT
NEEDLE 18GX1X1/2 (RX/OR ONLY) (NEEDLE) ×2 IMPLANT
NEEDLE HYPO 25GX1X1/2 BEV (NEEDLE) ×2 IMPLANT
NS IRRIG 1000ML POUR BTL (IV SOLUTION) ×2 IMPLANT
PACK GENERAL/GYN (CUSTOM PROCEDURE TRAY) ×2 IMPLANT
PACK UNIVERSAL I (CUSTOM PROCEDURE TRAY) ×2 IMPLANT
SPONGE T-LAP 18X18 ~~LOC~~+RFID (SPONGE) ×1 IMPLANT
STOCKINETTE IMPERVIOUS 9X36 MD (GAUZE/BANDAGES/DRESSINGS) ×2 IMPLANT
SUT MNCRL AB 4-0 PS2 18 (SUTURE) ×2 IMPLANT
SUT VIC AB 3-0 SH 8-18 (SUTURE) ×2 IMPLANT
SYR CONTROL 10ML LL (SYRINGE) ×2 IMPLANT
TOWEL GREEN STERILE (TOWEL DISPOSABLE) ×2 IMPLANT
TOWEL GREEN STERILE FF (TOWEL DISPOSABLE) ×2 IMPLANT
TRACER MAGTRACE VIAL (MISCELLANEOUS) ×1 IMPLANT

## 2021-10-03 NOTE — Interval H&P Note (Signed)
History and Physical Interval Note: ? ?10/03/2021 ?9:37 AM ? ?Breanna Wilkins  has presented today for surgery, with the diagnosis of LEFT BREAST CANCER.  The various methods of treatment have been discussed with the patient and family. After consideration of risks, benefits and other options for treatment, the patient has consented to  Procedure(s): ?LEFT BREAST LUMPECTOMY WITH RADIOACTIVE SEED AND SENTINEL LYMPH NODE BIOPSY (Left) as a surgical intervention.  The patient's history has been reviewed, patient examined, no change in status, stable for surgery.  I have reviewed the patient's chart and labs.  Questions were answered to the patient's satisfaction.   ? ? ?[name] ? ? ?

## 2021-10-03 NOTE — Transfer of Care (Signed)
Immediate Anesthesia Transfer of Care Note ? ?Patient: Breanna Wilkins ? ?Procedure(s) Performed: LEFT BREAST LUMPECTOMY WITH RADIOACTIVE SEED AND SENTINEL LYMPH NODE BIOPSY (Left: Breast) ? ?Patient Location: PACU ? ?Anesthesia Type:GA combined with regional for post-op pain ? ?Level of Consciousness: awake, alert  and oriented ? ?Airway & Oxygen Therapy: Patient Spontanous Breathing ? ?Post-op Assessment: Report given to RN, Post -op Vital signs reviewed and stable and Patient moving all extremities X 4 ? ?Post vital signs: Reviewed and stable ? ?Last Vitals:  ?Vitals Value Taken Time  ?BP 161/77 10/03/21 1116  ?Temp    ?Pulse 78 10/03/21 1118  ?Resp 16 10/03/21 1118  ?SpO2 98 % 10/03/21 1118  ?Vitals shown include unvalidated device data. ? ?Last Pain:  ?Vitals:  ? 10/03/21 0812  ?TempSrc: Oral  ?   ? ?  ? ?Complications: No notable events documented. ?

## 2021-10-03 NOTE — Anesthesia Postprocedure Evaluation (Signed)
Anesthesia Post Note ? ?Patient: Breanna Wilkins ? ?Procedure(s) Performed: LEFT BREAST LUMPECTOMY WITH RADIOACTIVE SEED AND SENTINEL LYMPH NODE BIOPSY (Left: Breast) ? ?  ? ?Patient location during evaluation: PACU ?Anesthesia Type: General ?Level of consciousness: awake ?Pain management: pain level controlled ?Vital Signs Assessment: post-procedure vital signs reviewed and stable ?Respiratory status: spontaneous breathing and respiratory function stable ?Cardiovascular status: stable ?Postop Assessment: no apparent nausea or vomiting ?Anesthetic complications: no ? ? ?No notable events documented. ? ?Last Vitals:  ?Vitals:  ? 10/03/21 1130 10/03/21 1145  ?BP: (!) 162/65 (!) 163/65  ?Pulse: 65 (!) 59  ?Resp: 11 13  ?Temp:    ?SpO2: 100% 98%  ?  ?Last Pain:  ?Vitals:  ? 10/03/21 1145  ?TempSrc:   ?PainSc: 0-No pain  ? ? ?  ?  ?  ?  ?  ?  ? ?Merlinda Frederick ? ? ? ? ?

## 2021-10-03 NOTE — Discharge Instructions (Addendum)
[facility] Office Phone Number [phone]  BREAST BIOPSY/ PARTIAL MASTECTOMY: POST OP INSTRUCTIONS  Always review your discharge instruction sheet given to you by the facility where your surgery was performed.  IF YOU HAVE DISABILITY OR FAMILY LEAVE FORMS, YOU MUST BRING THEM TO THE OFFICE FOR PROCESSING.  DO NOT GIVE THEM TO YOUR DOCTOR.  Take 2 tylenol (acetominophen) three times a day for 3 days.  If you still have pain, add ibuprofen with food in between if able to take this (if you have kidney issues or stomach issues, do not take ibuprofen).  If both of those are not enough, add the narcotic pain pill.  If you find you are needing a lot of this overnight after surgery, call the next morning for a refill.    Prescriptions will not be filled after 5pm or on week-ends. Take your usually prescribed medications unless otherwise directed You should eat very light the first 24 hours after surgery, such as soup, crackers, pudding, etc.  Resume your normal diet the day after surgery. Most patients will experience some swelling and bruising in the breast.  Ice packs and a good support bra will help.  Swelling and bruising can take several days to resolve.  It is common to experience some constipation if taking pain medication after surgery.  Increasing fluid intake and taking a stool softener will usually help or prevent this problem from occurring.  A mild laxative (Milk of Magnesia or Miralax) should be taken according to package directions if there are no bowel movements after 48 hours. Unless discharge instructions indicate otherwise, you may remove your bandages 48 hours after surgery, and you may shower at that time.  You may have steri-strips (small skin tapes) in place directly over the incision.  These strips should be left on the skin at least for for 7-10 days.    ACTIVITIES:  You may resume regular daily activities (gradually increasing) beginning the next day.  Wearing a  good support bra or sports bra (or the breast binder) minimizes pain and swelling.  You may have sexual intercourse when it is comfortable. No heavy lifting for 1-2 weeks (not over around 10 pounds).  You may drive when you no longer are taking prescription pain medication, you can comfortably wear a seatbelt, and you can safely maneuver your car and apply brakes. RETURN TO WORK:  __________3-14 days depending on job. _______________ You should see your doctor in the office for a follow-up appointment approximately two weeks after your surgery.  Your doctor's nurse will typically make your follow-up appointment when she calls you with your pathology report.  Expect your pathology report 3-4 business days after your surgery.  You may call to check if you do not hear from us after three days.   WHEN TO CALL YOUR DOCTOR: Fever over 101.0 Nausea and/or vomiting. Extreme swelling or bruising. Continued bleeding from incision. Increased pain, redness, or drainage from the incision.  The clinic staff is available to answer your questions during regular business hours.  Please don't hesitate to call and ask to speak to one of the nurses for clinical concerns.  If you have a medical emergency, go to the nearest emergency room or call 911.  A surgeon from [facility] is always on call at the hospital.  For further questions, please visit [email or web address]   

## 2021-10-03 NOTE — Anesthesia Procedure Notes (Signed)
Anesthesia Regional Block: Pectoralis block  ? ?Pre-Anesthetic Checklist: , timeout performed,  Correct Patient, Correct Site, Correct Laterality,  Correct Procedure, Correct Position, site marked,  Risks and benefits discussed,  Surgical consent,  Pre-op evaluation,  At surgeon's request and post-op pain management ? ?Laterality: Left ? ?Prep: chloraprep     ?  ?Needles:  ?Injection technique: Single-shot ? ?Needle Type: Echogenic Stimulator Needle   ? ? ?Needle Length: 10cm  ?Needle Gauge: 20  ? ? ? ?Additional Needles: ? ? ?Procedures:,,,, ultrasound used (permanent image in chart),,    ?Narrative:  ?Start time: 10/03/2021 9:20 AM ?End time: 10/03/2021 9:25 AM ?Injection made incrementally with aspirations every 5 mL. ? ?Performed by: Personally  ?Anesthesiologist: Merlinda Frederick, MD ? ?Additional Notes: ?Functioning IV was confirmed and monitors were applied.  Sterile prep and drape,hand hygiene and sterile gloves were used. Ultrasound guidance: relevant anatomy identified, needle position confirmed, local anesthetic spread visualized around nerve(s)., vascular puncture avoided. Negative aspiration and negative test dose prior to incremental administration of local anesthetic. The patient tolerated the procedure well. ? ? ? ? ? ? ?

## 2021-10-03 NOTE — Op Note (Signed)
Left Breast Radioactive seed localized lumpectomy and sentinel lymph node biopsy ? ?Indications: This patient presents with history of left breast cancer, upper outer quadrant, grade 1-2 invasive mammary carcinoma (ductal phenotype), [id number], +/+/- ? ?Pre-operative Diagnosis: left breast cancer ? ?Post-operative Diagnosis: Same ? ?Surgeon: [name]  ? ?Anesthesia: General endotracheal anesthesia ? ?ASA Class: 3 ? ?Procedure Details  ?The patient was seen in the Holding Room. The risks, benefits, complications, treatment options, and expected outcomes were discussed with the patient. The possibilities of bleeding, infection, the need for additional procedures, failure to diagnose a condition, and creating a complication requiring transfusion or operation were discussed with the patient. The patient concurred with the proposed plan, giving informed consent.  The site of surgery properly noted/marked. The patient was taken to Operating Room # 2, identified, and the procedure verified as Left Breast Seed localized Lumpectomy with sentinel lymph node biopsy. The left arm, breast, and chest were prepped and draped in standard fashion. A Time Out was held and the above information confirmed. ? ?The lumpectomy was performed by creating an axillary incision near the previously placed radioactive seed.  Dissection was carried down to around the point of maximum signal intensity. The cautery was used to perform the dissection.  Hemostasis was achieved with cautery. The edges of the cavity were marked with large clips, with one each medial, lateral, inferior and superior, and two clips posteriorly.   The specimen was inked with the margin marker paint kit.    Specimen radiography confirmed inclusion of the mammographic lesion, the clip, and the seed.  The background signal in the breast was zero.    ? ?Using a Sentimag probe, left axillary sentinel nodes were identified.  The same axillary incision was used.  Dissection  was carried through the clavipectoral fascia.  Two deep level 2 axillary sentinel nodes were removed.  Counts per second were >9999 for #1 and 2100 for #2.    The background count was 40 cps.  The wound was irrigated.  Hemostasis was achieved with cautery.  The axillary incision was closed with a 3-0 vicryl deep dermal interrupted sutures and a 4-0 monocryl subcuticular closure. ?   ?Sterile dressings were applied. At the end of the operation, all sponge, instrument, and needle counts were correct. ? ?Findings: ?grossly clear surgical margins and no adenopathy, anterior and lateral margins are skin, posterior margin is pectoralis. Superior margin is axilla ? ?Estimated Blood Loss:  min ?        ?Specimens: left breast tissue with seed and two deep axillary sentinel lymph nodes.     ?        ?Complications:  None; patient tolerated the procedure well. ?        ?Disposition: PACU - hemodynamically stable. ?        ?Condition: stable ? ?

## 2021-10-03 NOTE — Anesthesia Procedure Notes (Signed)
Procedure Name: LMA Insertion ?Date/Time: 10/03/2021 10:02 AM ?Performed by: Kyung Rudd, CRNA ?Pre-anesthesia Checklist: Patient identified, Emergency Drugs available, Suction available and Patient being monitored ?Patient Re-evaluated:Patient Re-evaluated prior to induction ?Oxygen Delivery Method: Circle System Utilized ?Preoxygenation: Pre-oxygenation with 100% oxygen ?Induction Type: IV induction ?LMA: LMA inserted ?LMA Size: 4.0 ?Number of attempts: 1 ?Placement Confirmation: positive ETCO2 and breath sounds checked- equal and bilateral ?Tube secured with: Tape ?Dental Injury: Teeth and Oropharynx as per pre-operative assessment  ? ? ? ? ?

## 2021-10-04 ENCOUNTER — Encounter (HOSPITAL_COMMUNITY): Payer: Self-pay | Admitting: General Surgery

## 2021-10-06 ENCOUNTER — Other Ambulatory Visit: Payer: Self-pay | Admitting: Interventional Cardiology

## 2021-10-07 ENCOUNTER — Other Ambulatory Visit: Payer: Self-pay | Admitting: *Deleted

## 2021-10-07 ENCOUNTER — Telehealth: Payer: Self-pay | Admitting: Genetic Counselor

## 2021-10-07 ENCOUNTER — Other Ambulatory Visit (HOSPITAL_COMMUNITY): Payer: Self-pay

## 2021-10-07 ENCOUNTER — Other Ambulatory Visit: Payer: Self-pay

## 2021-10-07 ENCOUNTER — Ambulatory Visit (INDEPENDENT_AMBULATORY_CARE_PROVIDER_SITE_OTHER): Payer: Medicare Other | Admitting: Internal Medicine

## 2021-10-07 ENCOUNTER — Ambulatory Visit: Payer: Self-pay | Admitting: Genetic Counselor

## 2021-10-07 ENCOUNTER — Encounter: Payer: Self-pay | Admitting: Internal Medicine

## 2021-10-07 VITALS — BP 151/56 | HR 60 | Temp 97.8°F | Ht 67.0 in | Wt 281.3 lb

## 2021-10-07 DIAGNOSIS — I509 Heart failure, unspecified: Secondary | ICD-10-CM

## 2021-10-07 DIAGNOSIS — G5711 Meralgia paresthetica, right lower limb: Secondary | ICD-10-CM

## 2021-10-07 DIAGNOSIS — M7061 Trochanteric bursitis, right hip: Secondary | ICD-10-CM

## 2021-10-07 DIAGNOSIS — Z1379 Encounter for other screening for genetic and chromosomal anomalies: Secondary | ICD-10-CM

## 2021-10-07 MED ORDER — FUROSEMIDE 20 MG PO TABS: 20.0000 mg | ORAL_TABLET | Freq: Every day | ORAL | 1 refills | Status: DC

## 2021-10-07 MED ORDER — LIDOCAINE 5 % EX PTCH
1.0000 | MEDICATED_PATCH | CUTANEOUS | 0 refills | Status: DC
  Filled 2021-10-07: qty 15, 15d supply, fill #0

## 2021-10-07 NOTE — Telephone Encounter (Signed)
I contacted Breanna Wilkins to discuss her genetic testing results. No pathogenic variants were identified in the 77 genes analyzed. Of note, a variant of uncertain significance was identified in the BRCA1 gene (p.[id number]). Detailed clinic note to follow. ? ?The test report has been scanned into EPIC and is located under the Molecular Pathology section of the Results Review tab.  A portion of the result report is included below for reference.  ? ?Lucille Passy, MS, [place] ?Genetic Counselor ?[email or web address] ?(P) [phone] ? ? ?

## 2021-10-07 NOTE — Progress Notes (Signed)
? ?CC: F/u for right sided pain  ? ?HPI: ? ?Breanna Wilkins is a 61 y.o. female with a PMHx stated below and presents today for stated above. Please see the Encounters tab for problem-based Assessment & Plan for additional details.  ? ?Past Medical History:  ?Diagnosis Date  ? Abdominal hernia   ? Arthritis   ? Breast cancer ([place])   ? CAD (coronary artery disease)   ? Perioperative non-STEMI March, 2014  //   cardiac catheterization at time of non-STEMI, March, 2014, minor nonobstructive coronary disease, vigorous LV function, possibly vasospastic event versus stress cardiomyopathy. No further workup of coronary disease  ? CHF (congestive heart failure) ([place])   ? Diverticulosis of colon 10/17/2017  ? Noted on colonoscopy 10/14/17  ? Ejection fraction   ? 65-70%, vigorous function, echo, March, 2011  ? Ejection fraction   ? Vigorous function at time of catheterization October 06, 2012,  ? GERD (gastroesophageal reflux disease)   ? History of colonic polyps 10/17/2017  ? Colonoscopy 10/14/17  ? Hyperlipidemia   ? Hypertension   ? Internal hemorrhoids without complication 23/53/6144  ? Noted on colonoscopy 10/14/17  ? Lung nodule 12/2009  ? Very small, left upper lobe by CT June, 2011, one-year followup was stable  ? Myocardial infarction [facility]) 09/2013  ? OSA (obstructive sleep apnea)   ? CPAP  ? Overweight(278.02)   ? Pulmonary embolus ([place]) 01/16/2010  ? on Coumadin indefinitely  ? Shortness of breath   ? Shoulder pain   ? Sleep apnea   ? Warfarin anticoagulation   ? ? ?Current Outpatient Medications on File Prior to Visit  ?Medication Sig Dispense Refill  ? aspirin 81 MG chewable tablet Chew 1 tablet (81 mg total) by mouth daily. 30 tablet 1  ? carvedilol (COREG) 6.25 MG tablet TAKE 1 TABLET BY MOUTH TWICE DAILY WITH MEALS 180 tablet 3  ? Cholecalciferol (VITAMIN D) 50 MCG (2000 UT) CAPS Take 2,000 Units by mouth daily.    ? diclofenac Sodium (VOLTAREN) 1 % GEL Apply 4g to knee four times a day as needed for pain,  apply 2g to shoulder as needed for pain (Patient not taking: Reported on 09/24/2021) 350 g 3  ? furosemide (LASIX) 20 MG tablet Take 1 tablet (20 mg total) by mouth daily. 90 tablet 1  ? HYDROcodone-acetaminophen (NORCO) 10-325 MG tablet Take 1 tablet by mouth every 6 (six) hours as needed for severe pain. 100 tablet 0  ? [START ON 10/26/2021] HYDROcodone-acetaminophen (NORCO) 10-325 MG tablet Take 1 tablet by mouth every 6 (six) hours as needed for severe pain. 100 tablet 0  ? [START ON 11/25/2021] HYDROcodone-acetaminophen (NORCO) 10-325 MG tablet Take 1 tablet by mouth every 6 (six) hours as needed for severe pain. 100 tablet 0  ? Insulin Pen Needle (NOVOFINE) 32G X 6 MM MISC 1 Units by Does not apply route daily. 100 each 1  ? lidocaine (XYLOCAINE) 5 % ointment Apply 1 application topically 2 (two) times daily as needed. (Patient not taking: Reported on 09/24/2021) 150 g 1  ? olmesartan (BENICAR) 40 MG tablet Take 1 tablet (40 mg total) by mouth daily. 90 tablet 3  ? oxyCODONE (OXY IR/ROXICODONE) 5 MG immediate release tablet Take 1 tablet (5 mg total) by mouth every 6 (six) hours as needed for severe pain. 15 tablet 0  ? polyethylene glycol (MIRALAX / GLYCOLAX) packet Take 17 g by mouth daily.    ? rivaroxaban (XARELTO) 20 MG TABS tablet Take 1  tablet (20 mg total) by mouth daily with supper. 90 tablet 3  ? rosuvastatin (CRESTOR) 5 MG tablet Take 1 tablet (5 mg total) by mouth at bedtime. 90 tablet 3  ? ?No current facility-administered medications on file prior to visit.  ? ? ?Family History  ?Problem Relation Age of Onset  ? Diabetes Mother   ? Ovarian cancer Mother 8  ? Diabetes Father   ? Diabetes Sister   ? Colon polyps Sister   ? Brain cancer Maternal Aunt   ?     unsure if it was a brain primary or cancer that metastasized to the brain  ? Cancer Paternal Uncle   ?     unknown type  ? Bone cancer Maternal Grandmother   ?     unsure if it was a bone primary or cancer that metastasized to the bones  ? Breast  cancer Neg Hx   ? Colon cancer Neg Hx   ? Esophageal cancer Neg Hx   ? Rectal cancer Neg Hx   ? Stomach cancer Neg Hx   ? ? ?Social History  ? ?Socioeconomic History  ? Marital status: Single  ?  Spouse name: Not on file  ? Number of children: 0  ? Years of education: 64  ? Highest education level: 10th grade  ?Occupational History  ? Occupation: Disabled  ?Tobacco Use  ? Smoking status: Former  ?  Types: Cigarettes  ?  Quit date: 05/03/1988  ?  Years since quitting: 33.4  ?  Passive exposure: Never  ? Smokeless tobacco: Never  ?Vaping Use  ? Vaping Use: Never used  ?Substance and Sexual Activity  ? Alcohol use: Yes  ?  Alcohol/week: 1.0 standard drink  ?  Types: 1 Cans of beer per week  ?  Comment: Wine sometimes.  ? Drug use: Never  ? Sexual activity: Not Currently  ?Other Topics Concern  ? Not on file  ?Social History Narrative  ? Financial assistance approved for 100% discount at [facility] and has [facility] card per [place]6/01/2010  ?   ? Current Social History 02/11/2021    ?   ? Patient lives with sister in a home  that is 1 story. There are 4 steps up to the entrance the patient uses. There is a railing.   ?   ? Patient's method of transportation is personal car.  ?   ? The highest level of education was some high school.  ?   ? The patient currently is employed as aid on school bus.  ?   ? Identified important Relationships are "my sister"   ?   ? Pets : 0  ?    ? Interests / Fun: "I like to fish and I love my job"   ?   ? Current Stressors: "none"   ?   ? Religious / Personal Beliefs: "I don't know"   ?    ? ?Social Determinants of Health  ? ?Financial Resource Strain: Not on file  ?Food Insecurity: No Food Insecurity  ? Worried About Charity fundraiser in the Last Year: Never true  ? Ran Out of Food in the Last Year: Never true  ?Transportation Needs: No Transportation Needs  ? Lack of Transportation (Medical): No  ? Lack of Transportation (Non-Medical): No  ?Physical Activity: Not on file  ?Stress: No Stress  Concern Present  ? Feeling of Stress : Not at all  ?Social Connections: Not on file  ?Intimate Partner  Violence: Not At Risk  ? Fear of Current or Ex-Partner: No  ? Emotionally Abused: No  ? Physically Abused: No  ? Sexually Abused: No  ? ? ?Review of Systems: ?ROS negative except for what is noted on the assessment and plan. ? ?Vitals:  ? 10/07/21 1005 10/07/21 1018  ?BP: (!) 151/71 (!) 151/56  ?Pulse: 65 60  ?Temp: 97.8 ?F (36.6 ?C)   ?TempSrc: Oral   ?SpO2: 100%   ?Weight: 281 lb 4.8 oz (127.6 kg)   ?Height: '5\' 7"'$  (1.702 m)   ? ? ?Physical Exam: ?Constitutional: alert, well-appearing, in NAD ?HENT: normocephalic, atraumatic, mucous membranes moist ?Eyes: conjunctiva non-erythematous, EOMI ?Cardiovascular: RRR, no m/r/g, 1+ bilateral LE ?Pulmonary/Chest: normal work of breathing on RA, LCTAB ?Abdominal: soft, non-tender to palpation, non-distended ?MSK: Good ROM of bilateral knees. Pain, paresthesia, and mild numbness along right inner and outer thigh, most significant along her right trochanter. Left left exam wnl; she has no pain along outer or inner left thigh.  ?Neurological: A&O x 3 and follows commands  ? ? ?Assessment & Plan:  ? ?See Encounters Tab for problem based charting. ? ?Patient discussed with Dr. Daryll Drown ? ?Lajean Manes, MD  ?Internal Medicine Resident, PGY-1 ?[facility]  ?

## 2021-10-07 NOTE — Progress Notes (Signed)
HPI:   ?Breanna Wilkins was previously seen in the [place] clinic due to a personal and family history of cancer and concerns regarding a hereditary predisposition to cancer. Please refer to our prior cancer genetics clinic note for more information regarding our discussion, assessment and recommendations, at the time. Breanna Wilkins recent genetic test results were disclosed to her, as were recommendations warranted by these results. These results and recommendations are discussed in more detail below. ? ?CANCER HISTORY:  ?Oncology History  ?Malignant neoplasm of upper-outer quadrant of left breast in female, estrogen receptor positive ([place])  ?09/10/2021 Initial Diagnosis  ? Malignant neoplasm of upper-outer quadrant of left breast in female, estrogen receptor positive ([place]) ?  ? Genetic Testing  ? [facility] CancerNext-Expanded panel was Negative. Of note, a variant of uncertain significance was detected in the BRCA1 gene (p.I429T). Report date is 09/30/2021.  ? ?The CancerNext-Expanded gene panel offered by [facility] and includes sequencing, rearrangement, and RNA analysis for the following 77 genes: AIP, ALK, APC, ATM, AXIN2, BAP1, BARD1, BLM, BMPR1A, BRCA1, BRCA2, BRIP1, CDC73, CDH1, CDK4, CDKN1B, CDKN2A, CHEK2, CTNNA1, DICER1, FANCC, FH, FLCN, GALNT12, KIF1B, LZTR1, MAX, MEN1, MET, MLH1, MSH2, MSH3, MSH6, MUTYH, NBN, NF1, NF2, NTHL1, PALB2, PHOX2B, PMS2, POT1, PRKAR1A, PTCH1, PTEN, RAD51C, RAD51D, RB1, RECQL, RET, SDHA, SDHAF2, SDHB, SDHC, SDHD, SMAD4, SMARCA4, SMARCB1, SMARCE1, STK11, SUFU, TMEM127, TP53, TSC1, TSC2, VHL and XRCC2 (sequencing and deletion/duplication); EGFR, EGLN1, HOXB13, KIT, MITF, PDGFRA, POLD1, and POLE (sequencing only); EPCAM and GREM1 (deletion/duplication only).  ?  ? ? ?FAMILY HISTORY:  ?We obtained a detailed, 4-generation family history.  Significant diagnoses are listed below: ?Family History  ?Problem Relation Age of Onset  ? Diabetes Mother   ? Ovarian cancer Mother  41  ? Diabetes Father   ? Diabetes Sister   ? Colon polyps Sister   ? Brain cancer Maternal Aunt   ?     unsure if it was a brain primary or cancer that metastasized to the brain  ? Cancer Paternal Uncle   ?     unknown type  ? Bone cancer Maternal Grandmother   ?     unsure if it was a bone primary or cancer that metastasized to the bones  ? Breast cancer Neg Hx   ? Colon cancer Neg Hx   ? Esophageal cancer Neg Hx   ? Rectal cancer Neg Hx   ? Stomach cancer Neg Hx   ? ? ?    ?  ?  ?Breanna Wilkins's mother was diagnosed with ovarian cancer at age 43 and died at age 13. Her maternal aunt was diagnosed with brain cancer, she is unsure if the cancer was a brain primary or cancer that metastasized to the brain, she is deceased. Her maternal grandmother was diagnosed with bone cancer, she is unsure if the cancer was a bone primary or cancer that metastasized to the bone, she is deceased. Breanna Wilkins's paternal aunt was diagnosed with an unknown cancer, she is deceased. Breanna Wilkins is unaware of previous family history of genetic testing for hereditary cancer risks. There is no reported Ashkenazi Jewish ancestry. ? ?GENETIC TEST RESULTS:  ?The [facility] CancerNext-Expanded Panel found no pathogenic mutations.  ? ?The CancerNext-Expanded gene panel offered by [facility] and includes sequencing, rearrangement, and RNA analysis for the following 77 genes: AIP, ALK, APC, ATM, AXIN2, BAP1, BARD1, BLM, BMPR1A, BRCA1, BRCA2, BRIP1, CDC73, CDH1, CDK4, CDKN1B, CDKN2A, CHEK2, CTNNA1, DICER1, FANCC, FH, FLCN, GALNT12, KIF1B, LZTR1, MAX,  MEN1, MET, MLH1, MSH2, MSH3, MSH6, MUTYH, NBN, NF1, NF2, NTHL1, PALB2, PHOX2B, PMS2, POT1, PRKAR1A, PTCH1, PTEN, RAD51C, RAD51D, RB1, RECQL, RET, SDHA, SDHAF2, SDHB, SDHC, SDHD, SMAD4, SMARCA4, SMARCB1, SMARCE1, STK11, SUFU, TMEM127, TP53, TSC1, TSC2, VHL and XRCC2 (sequencing and deletion/duplication); EGFR, EGLN1, HOXB13, KIT, MITF, PDGFRA, POLD1, and POLE (sequencing only); EPCAM and GREM1  (deletion/duplication only).  ? ?The test report has been scanned into EPIC and is located under the Molecular Pathology section of the Results Review tab.  A portion of the result report is included below for reference. Genetic testing reported out on 09/30/2021.  ? ? ? ? ? ?Genetic testing identified a variant of uncertain significance (VUS) in the BRCA1 gene called p.I429T.  At this time, it is unknown if this variant is associated with an increased risk for cancer or if it is benign, but most uncertain variants are reclassified to benign. It should not be used to make medical management decisions. With time, we suspect the laboratory will determine the significance of this variant, if any. If the laboratory reclassifies this variant, we will attempt to contact Breanna Wilkins to discuss it further.  ? ?Even though a pathogenic variant was not identified, possible explanations for the cancer in the family may include: ?There may be no hereditary risk for cancer in the family. The cancers in Breanna Wilkins and/or her family may be due to other genetic or environmental factors. ?There may be a gene mutation in one of these genes that current testing methods cannot detect, but that chance is small. ?There could be another gene that has not yet been discovered, or that we have not yet tested, that is responsible for the cancer diagnoses in the family.  ?It is also possible there is a hereditary cause for the cancer in the family that Breanna Wilkins did not inherit. ?The variant of uncertain significance detected in the BRCA1 gene may be reclassified as a pathogenic variant in the future. At this time, we do not know if this variant increases the risk for cancer. ? ?Therefore, it is important to remain in touch with cancer genetics in the future so that we can continue to offer Breanna Wilkins the most up to date genetic testing.  ? ?ADDITIONAL GENETIC TESTING:  ?We discussed with Breanna Wilkins that her genetic testing was fairly  extensive.  If there are genes identified to increase cancer risk that can be analyzed in the future, we would be happy to discuss and coordinate this testing at that time.   ? ?CANCER SCREENING RECOMMENDATIONS:  ?Breanna Wilkins test result is considered negative (normal).  This means that we have not identified a hereditary cause for her personal and family history of cancer at this time.  ? ?An individual's cancer risk and medical management are not determined by genetic test results alone. Overall cancer risk assessment incorporates additional factors, including personal medical history, family history, and any available genetic information that may result in a personalized plan for cancer prevention and surveillance. Therefore, it is recommended she continue to follow the cancer management and screening guidelines provided by her oncology and primary healthcare provider. ? ?RECOMMENDATIONS FOR FAMILY MEMBERS:   ?Individuals in this family might be at some increased risk of developing cancer, over the general population risk, due to the family history of cancer. We recommend women in this family have a yearly mammogram beginning at age 37, or 1 years younger than the earliest onset of cancer, an annual clinical breast exam, and perform  monthly breast self-exams.  ?Other members of the family may still carry a pathogenic variant in one of these genes that Breanna Wilkins did not inherit. Based on the family history, we recommend her sisters have genetic counseling and testing.  ?We do not recommend familial testing for the BRCA1 variant of uncertain significance (VUS). ? ?FOLLOW-UP:  ?Cancer genetics is a rapidly advancing field and it is possible that new genetic tests will be appropriate for her and/or her family members in the future. We encouraged her to remain in contact with cancer genetics on an annual basis so we can update her personal and family histories and let her know of advances in cancer genetics that  may benefit this family.  ? ?Our contact number was provided. Breanna Wilkins's questions were answered to her satisfaction, and she knows she is welcome to call us at anytime with additional questions or concerns.  ?

## 2021-10-07 NOTE — Patient Instructions (Addendum)
Please apply lidocaine patch to your right hip as needed  ?Please continue to try and lose weight, as it will help with the symptomatics that you are experiencing.  ?Continue to avoid tight fitting clothing.  ? ?Please give [place] a call if these symptoms do not improve in 2 weeks.  ?

## 2021-10-07 NOTE — Assessment & Plan Note (Addendum)
Evaluated 11 days ago by PCP/Dr [name] [place] for numbness and tingling in the right lateral thigh. S/sxs were consistent with meralgia paresthetica, and she was advised to avoid tight fitting clothing and to continue with weight loss. She presents today complaining of ongoing symptoms with increased pain along her right lateral thigh, most significant along her right trochanter. She reports taking 2 pills of her Oxy 10 this morning with no relief. She reports that she avoided tight fitting clothing, and has not noticed improvement. She is frustrated with the symptoms today, reporting that pain is limiting mobiltiy. She reports that all of these sxs were abrupt in onset, with no preceding incident and no known trauma to the area, and no prior incidence of this. On exam, she has good ROM of bilateral knees. She has pain, tingling, and mild numbness along right inner and outer thigh, most significant along her right trochanter. Left left exam wnl; she has no pain along outer or inner left thigh. Overall, pain is limiting mobility- unable to assess gait. Given that doubled dose of oxy did not provide relief, suspect that this is not visceral pain and more consistent with neuropathic pain. She may also have a component of trochanter bursitis. Will trial conservative management at this time, but she may require glucocorticoid injection if sxs do not improve. Offered lidocaine patches and gabapentin for neuropathic pain, but she is reluctant to starting gabapentin, reporting that she gained weight that last time she took it, and would prefer to simply try lidocaine patches alone at this time.  ? ?-Lidocaine patches prescribed  ?-Discussed activity modification  ?-Continue to avoid tight fitting clothing  ?-Continue with weight loss  ?-May need steroid injection for bursitis if pain does not improve ?-Advanced therapy for MP including surgical release and nerve transection may need to be considered if sxs continue for >1-3  months.  ?

## 2021-10-07 NOTE — Patient Outreach (Signed)
[facility]) Care Management ? ?10/07/2021 ? ?Breanna Wilkins ?1961/06/20 ?[id number] ? ? ?RN Health Coach telephone call to patient.  [employer] compliance verified. ?Per pt she is in the Dr office with severe back pain. ? ?Plan: ?RN will follow up within 10 days. ? ?Johny Shock BSN RN ?[place] [facility] ?[phone] ? ?

## 2021-10-09 ENCOUNTER — Telehealth: Payer: Self-pay | Admitting: *Deleted

## 2021-10-09 ENCOUNTER — Other Ambulatory Visit: Payer: Self-pay | Admitting: *Deleted

## 2021-10-09 ENCOUNTER — Encounter: Payer: Self-pay | Admitting: *Deleted

## 2021-10-09 ENCOUNTER — Telehealth: Payer: Self-pay | Admitting: Internal Medicine

## 2021-10-09 LAB — SURGICAL PATHOLOGY

## 2021-10-09 MED ORDER — PREGABALIN 25 MG PO CAPS
25.0000 mg | ORAL_CAPSULE | Freq: Three times a day (TID) | ORAL | 2 refills | Status: DC
Start: 2021-10-09 — End: 2021-10-16

## 2021-10-09 MED ORDER — CYCLOBENZAPRINE HCL 7.5 MG PO TABS: 7.5000 mg | ORAL_TABLET | Freq: Three times a day (TID) | ORAL | 1 refills | Status: DC | PRN

## 2021-10-09 NOTE — Telephone Encounter (Signed)
Called Breanna Wilkins, lateral hip pain is worse really looking for anything that may help.  She previously did not tolerate gabapentin for neuropathic pain will with salt retention/weight gain.  Will try low dose of lyrica '25mg'$  TID.  Also Ok to use flexeril sparingly as there may be a muscular component.  Cautioned not to drive taking these medications. ? ?Also noted tylenol arthiritis is fine but that her usual pain medication also has '325mg'$  of tylenol and needs to keep total daily dose '3000mg'$  ?

## 2021-10-09 NOTE — Telephone Encounter (Signed)
Received order for oncotype testing. Requisition faxed to pathology and [facility] °

## 2021-10-09 NOTE — Telephone Encounter (Signed)
Pt requesting to leave a message for her PCP to confirm she rec'd her medication today. ?

## 2021-10-09 NOTE — Telephone Encounter (Signed)
Patient called in for nerve pain right thigh to right buttocks. Rates 100/10 at present. States she has taken flexeril in past with relief. Requesting refill at [facility] in [place]. Also, wants to know if she can take Tylenol arthritis, and if there is anything else PCP would recommend.  ?

## 2021-10-14 ENCOUNTER — Ambulatory Visit (HOSPITAL_BASED_OUTPATIENT_CLINIC_OR_DEPARTMENT_OTHER): Payer: Medicare Other | Admitting: Family

## 2021-10-14 NOTE — Progress Notes (Signed)
Internal Medicine Clinic Attending  Case discussed with Dr. Patel at the time of the visit.  We reviewed the resident's history and exam and pertinent patient test results.  I agree with the assessment, diagnosis, and plan of care documented in the resident's note.  

## 2021-10-15 ENCOUNTER — Other Ambulatory Visit: Payer: Self-pay | Admitting: Interventional Cardiology

## 2021-10-15 ENCOUNTER — Telehealth: Payer: Self-pay

## 2021-10-15 ENCOUNTER — Other Ambulatory Visit: Payer: Self-pay | Admitting: Internal Medicine

## 2021-10-15 NOTE — Telephone Encounter (Signed)
Requesting a letter for work. Please call pt back.  

## 2021-10-15 NOTE — Patient Outreach (Signed)
[facility] Care Management ?RN Health Coach Note ? ? ?10/15/2021 ?Name:  Breanna Wilkins MRN:  [id number] DOB:  23-Aug-1960 ? ?Summary: ?Patient blood pressure elevated at Dr office. Patient has not been exercising. Per patient having some knee pain. Per patient she has not lost any weight since the last outreach.  ? ?Recommendations/Changes made from today's visit: ?Start exercise routine ?Adhere to diet ?Monitor portion control ?Medication adherence ? ? ? ?Subjective: ?Breanna Wilkins is an 61 y.o. year old female who is a primary patient of Lucious Groves, DO. The care management team was consulted for assistance with care management and/or care coordination needs.   ? ?RN Health Coach completed Telephone Visit today.  ? ?Objective: ? ?Medications Reviewed Today   ? ? Reviewed by Mabeline Caras, RN (Registered Nurse) on 10/03/21 at [place] List Status: Complete  ? ?Medication Order Taking? Sig Documenting Provider Last Dose Status Informant  ?aspirin 81 MG chewable tablet [id number] Yes Chew 1 tablet (81 mg total) by mouth daily. Hosie Poisson, MD 09/30/2021 Active Self  ?carvedilol (COREG) 6.25 MG tablet [id number] Yes TAKE 1 TABLET BY MOUTH TWICE DAILY WITH MEALS Belva Crome, MD 10/03/2021 [phone] Active Self  ?Cholecalciferol (VITAMIN D) 50 MCG (2000 UT) CAPS [id number] Yes Take 2,000 Units by mouth daily. [provider] 10/02/2021 Active Self  ?diclofenac Sodium (VOLTAREN) 1 % GEL [id number] No Apply 4g to knee four times a day as needed for pain, apply 2g to shoulder as needed for pain  ?Patient not taking: Reported on 09/24/2021  ? Lucious Groves, DO Not Taking Active Self  ?furosemide (LASIX) 20 MG tablet [id number] Yes Take 1 tablet (20 mg total) by mouth daily. Lucious Groves, DO 10/02/2021 Active Self  ?HYDROcodone-acetaminophen (NORCO) 10-325 MG tablet [id number] Yes Take 1 tablet by mouth every 6 (six) hours as needed for severe pain. Lucious Groves, DO 10/03/2021 0160 Active    ?HYDROcodone-acetaminophen (NORCO) 10-325 MG tablet [id number]  Take 1 tablet by mouth every 6 (six) hours as needed for severe pain. Lucious Groves, DO  Active   ?HYDROcodone-acetaminophen (NORCO) 10-325 MG tablet [id number]  Take 1 tablet by mouth every 6 (six) hours as needed for severe pain. Lucious Groves, DO  Active   ?Insulin Pen Needle (NOVOFINE) 32G X 6 MM MISC [id number]  1 Units by Does not apply route daily. Lucious Groves, DO  Consider Medication Status and Discontinue (No longer needed (for PRN medications)) Self  ?lidocaine (XYLOCAINE) 5 % ointment [id number] No Apply 1 application topically 2 (two) times daily as needed.  ?Patient not taking: Reported on 09/24/2021  ? Lucious Groves, DO Not Taking Active Self  ?olmesartan (BENICAR) 40 MG tablet [id number] Yes Take 1 tablet (40 mg total) by mouth daily. Lucious Groves, DO 10/02/2021 Active Self  ?polyethylene glycol Merril Abbe / GLYCOLAX) packet [id number] Yes Take 17 g by mouth daily. [provider] Past Week Active Self  ?rivaroxaban (XARELTO) 20 MG TABS tablet [id number] Yes Take 1 tablet (20 mg total) by mouth daily with supper. Lucious Groves, DO 09/30/2021 Active Self  ?rosuvastatin (CRESTOR) 5 MG tablet [id number] Yes Take 1 tablet (5 mg total) by mouth at bedtime. Lucious Groves, DO 10/02/2021 Active Self  ?         ?Med Note Wilmon Pali, MELISSA R   Tue Sep 24, 2021 12:56 PM)    ?Semaglutide, 2 MG/DOSE, 8 MG/3ML SOPN [id number] No Inject 2 mg  into the skin once a week.  ?Patient not taking: Reported on 09/24/2021  ? Lucious Groves, DO Not Taking Consider Medication Status and Discontinue (No longer needed (for PRN medications)) Self  ?         ?Med Note Wilmon Pali, MELISSA R   Tue Sep 24, 2021 12:56 PM)    ?Med List Note Merceda Elks, CPhT 09/24/21 1258): Pt has CPAP Machine  ? ?  ?  ? ?  ? ? ? ?SDOH:  (Social Determinants of Health) assessments and interventions performed:  ?SDOH Interventions   ? ?Flowsheet Row Most Recent Value  ?SDOH  Interventions   ?Food Insecurity Interventions Intervention Not Indicated  ?Housing Interventions Intervention Not Indicated  ?Transportation Interventions Intervention Not Indicated  ? ?  ? ? ?Care Plan ? ?Review of patient past medical history, allergies, medications, health status, including review of consultants reports, laboratory and other test data, was performed as part of comprehensive evaluation for care management services.  ? ?Care Plan : RN Care Manager Plan of Care  ?Updates made by Cornie Herrington, Eppie Gibson, RN since 10/15/2021 12:00 AM  ?  ? ?Problem: Knowledge Deficit  Related to Hypertension and Care Coordination Needs   ?Priority: High  ?  ? ?Long-Range Goal: Visual merchandiser Of Care for Management of Hypertension   ?Start Date: 07/02/2021  ?Expected End Date: 07/19/2022  ?Priority: High  ?Note:   ?Current Barriers:  ?Knowledge Deficits related to plan of care for management of HTN  ? ?RNCM Clinical Goal(s):  ?Patient will verbalize understanding of plan for management of HTN as evidenced by Continuation of monitoring blood pressure and adhering to 2300 mg sodium diet  through collaboration with RN Care manager, provider, and care team.  ? ?Interventions: ?Inter-disciplinary care team collaboration (see longitudinal plan of care) ?Evaluation of current treatment plan related to  self management and patient's adherence to plan as established by provider ? ? ? ?Patient Goals/Self-Care Activities: ?Take medications as prescribed   ?Attend all scheduled provider appointments ?Call pharmacy for medication refills 3-7 days in advance of running out of medications ?Attend church or other social activities ?Perform all self care activities independently  ?Perform IADL's (shopping, preparing meals, housekeeping, managing finances) independently ?Call provider office for new concerns or questions  ?check blood pressure daily ?choose a place to take my blood pressure (home, clinic or office, retail store) ?learn  about high blood pressure ?take blood pressure log to all doctor appointments ?call doctor for signs and symptoms of high blood pressure ?develop an action plan for high blood pressure ?keep all doctor appointments ?take medications for blood pressure exactly as prescribed ?begin an exercise program ?report new symptoms to your doctor ?limit salt intake to 2300 mg/day ?  ?[id number] Per patient she has not been monitoring her blood pressure. Her readings had been high at the Dr office. Patient had not lost any weight since last outreach. She stated that she had gotten off her diet. She has not had any falls since last outreach. She has been taking her medications as per ordered. RN reiterated the importance of exercising.  ?  ?  ? ?Plan: Telephone follow up appointment with care management team member scheduled for:  December 20, 2021 ?The patient has been provided with contact information for the care management team and has been advised to call with any health related questions or concerns.  ? ?Johny Shock BSN RN ?[place] [facility] ?[phone]; ? ? ?  ?

## 2021-10-15 NOTE — Telephone Encounter (Signed)
Returned call to patient. States she needs a letter to be out of work 3/20 to 3/31. May return to work 4/1 with no restrictions. Patient would like letter faxed to [phone]. ?

## 2021-10-15 NOTE — Patient Instructions (Signed)
Visit Information ? ?Thank you for taking time to visit with me today. Please don't hesitate to contact me if I can be of assistance to you before our next scheduled telephone appointment. ? ?Following are the goals we discussed today:  ?Current Barriers:  ?Knowledge Deficits related to plan of care for management of HTN  ? ?RNCM Clinical Goal(s):  ?Patient will verbalize understanding of plan for management of HTN as evidenced by Continuation of monitoring blood pressure and adhering to 2300 mg sodium diet through collaboration with RN Care manager, provider, and care team.  ? ?Interventions: ?Inter-disciplinary care team collaboration (see longitudinal plan of care) ?Evaluation of current treatment plan related to  self management and patient's adherence to plan as established by provider ? ? ? ?Patient Goals/Self-Care Activities: ?Take medications as prescribed   ?Attend all scheduled provider appointments ?Call pharmacy for medication refills 3-7 days in advance of running out of medications ?Attend church or other social activities ?Perform all self care activities independently  ?Perform IADL's (shopping, preparing meals, housekeeping, managing finances) independently ?Call provider office for new concerns or questions  ?check blood pressure daily ?choose a place to take my blood pressure (home, clinic or office, retail store) ?learn about high blood pressure ?take blood pressure log to all doctor appointments ?call doctor for signs and symptoms of high blood pressure ?develop an action plan for high blood pressure ?keep all doctor appointments ?take medications for blood pressure exactly as prescribed ?begin an exercise program ?report new symptoms to your doctor ?limit salt intake to 2300 mg/day ?  ?[id number] Per patient she has not been monitoring her blood pressure. Her readings had been high at the Dr office. Patient had not lost any weight since last outreach. She stated that she had gotten off her diet.  She has not had any falls since last outreach. She has been taking her medications as per ordered. RN reiterated the importance of exercising.  ? ?Our next appointment is by telephone on December 20, 2021 ? ?Please call Johny Shock [phone] if you need to cancel or reschedule your appointment.  ? ?Please call the Suicide and Crisis Lifeline: 988 if you are experiencing a Mental Health or [place] or need someone to talk to. ? ?The patient verbalized understanding of instructions, educational materials, and care plan provided today and agreed to receive a mailed copy of patient instructions, educational materials, and care plan.  ? ?Telephone follow up appointment with care management team member scheduled for: ?The patient has been provided with contact information for the care management team and has been advised to call with any health related questions or concerns.  ? ?SIGNATURE ? ?Johny Shock BSN RN ?[facility] ?[phone] ? ? ?  ?

## 2021-10-16 ENCOUNTER — Inpatient Hospital Stay: Payer: Medicare Other | Admitting: Hematology and Oncology

## 2021-10-16 ENCOUNTER — Telehealth: Payer: Self-pay

## 2021-10-16 MED ORDER — CARVEDILOL 6.25 MG PO TABS: 6.2500 mg | ORAL_TABLET | Freq: Two times a day (BID) | ORAL | 3 refills | Status: DC

## 2021-10-16 MED ORDER — PREGABALIN 50 MG PO CAPS: 50.0000 mg | ORAL_CAPSULE | Freq: Three times a day (TID) | ORAL | 2 refills | Status: DC

## 2021-10-16 NOTE — Telephone Encounter (Signed)
Pt is requesting a call back .. she is wanting to know if she can increase her ( PREGABALIN  )  ... she is also wanting to know if she can go to PT  ?

## 2021-10-16 NOTE — Telephone Encounter (Signed)
Notes that she is getting benefit from pregabalin, no excessive sleepiness or lethargy, no increased trouble with gait, no increased leg swelling.  Discussed would start with increasing dose at bedtime to '50mg'$  and if not excessively sedating may go to '50mg'$  TID. ? ?Also requested refill of coreg. ? ?We discussed option of PT, would like her to be seen and reevaluated before placing order, will hold off for now. ?

## 2021-10-22 ENCOUNTER — Encounter (HOSPITAL_COMMUNITY): Payer: Self-pay

## 2021-10-23 ENCOUNTER — Encounter (HOSPITAL_COMMUNITY): Payer: Self-pay

## 2021-10-23 ENCOUNTER — Ambulatory Visit (INDEPENDENT_AMBULATORY_CARE_PROVIDER_SITE_OTHER): Payer: Medicare Other | Admitting: Internal Medicine

## 2021-10-23 ENCOUNTER — Other Ambulatory Visit: Payer: Self-pay

## 2021-10-23 ENCOUNTER — Encounter: Payer: Self-pay | Admitting: Internal Medicine

## 2021-10-23 ENCOUNTER — Telehealth: Payer: Self-pay | Admitting: *Deleted

## 2021-10-23 ENCOUNTER — Encounter: Payer: Self-pay | Admitting: *Deleted

## 2021-10-23 VITALS — BP 128/78 | HR 82 | Temp 97.8°F | Ht 67.0 in | Wt 284.6 lb

## 2021-10-23 DIAGNOSIS — G5711 Meralgia paresthetica, right lower limb: Secondary | ICD-10-CM | POA: Diagnosis not present

## 2021-10-23 NOTE — Patient Instructions (Addendum)
[name], ? ?It was a pleasure seeing you in clinic. Today we discussed:  ? ?Meralgia Paresthetica: ?I have placed a referral to physical therapy. We will try to get this in [place], [place].  ?Continue your lyrica and lidocaine patch as needed  ? ?If you have any questions or concerns, please call our clinic at [phone] between 9am-5pm and after hours call [phone] and ask for the internal medicine resident on call. If you feel you are having a medical emergency please call 911.  ? ?Thank you, we look forward to helping you remain healthy! ? ?\ ? ?

## 2021-10-23 NOTE — Progress Notes (Signed)
? ?  CC: meralgia paresthetica f/u ? ?HPI: ? ?Breanna Wilkins is a 61 y.o. female with PMHx as stated presenting for follow up of her meralgia paresthetica. She reports persistent symptoms although slightly improved. Please see problem based charting for complete assessment and pla. ? ?Past Medical History:  ?Diagnosis Date  ? Abdominal hernia   ? Arthritis   ? Breast cancer ([place])   ? CAD (coronary artery disease)   ? Perioperative non-STEMI March, 2014  //   cardiac catheterization at time of non-STEMI, March, 2014, minor nonobstructive coronary disease, vigorous LV function, possibly vasospastic event versus stress cardiomyopathy. No further workup of coronary disease  ? CHF (congestive heart failure) ([place])   ? Diverticulosis of colon 10/17/2017  ? Noted on colonoscopy 10/14/17  ? Ejection fraction   ? 65-70%, vigorous function, echo, March, 2011  ? Ejection fraction   ? Vigorous function at time of catheterization October 06, 2012,  ? GERD (gastroesophageal reflux disease)   ? History of colonic polyps 10/17/2017  ? Colonoscopy 10/14/17  ? Hyperlipidemia   ? Hypertension   ? Internal hemorrhoids without complication 20/25/4270  ? Noted on colonoscopy 10/14/17  ? Lung nodule 12/2009  ? Very small, left upper lobe by CT June, 2011, one-year followup was stable  ? Myocardial infarction [facility]) 09/2013  ? OSA (obstructive sleep apnea)   ? CPAP  ? Overweight(278.02)   ? Pulmonary embolus ([place]) 01/16/2010  ? on Coumadin indefinitely  ? Shortness of breath   ? Shoulder pain   ? Sleep apnea   ? Warfarin anticoagulation   ? ?Review of Systems:  Negative except as stated in HPI. ? ?Physical Exam: ? ?Vitals:  ? 10/23/21 1048  ?BP: 128/78  ?Pulse: 82  ?Temp: 97.8 ?F (36.6 ?C)  ?TempSrc: Oral  ?SpO2: 100%  ?Weight: 284 lb 9.6 oz (129.1 kg)  ?Height: '5\' 7"'$  (1.702 m)  ? ?Physical Exam  ?Constitutional: middle aged obese female; No distress.  ?Cardiovascular: Normal rate, regular rhythm, Distal pulses intact ?Respiratory: Effort  normal on room air  ?Musculoskeletal: Normal bulk and tone.  No peripheral edema noted. TTP along the lateral aspect of right thigh ?Neurological: Is alert and oriented x4, no apparent focal deficits noted. ?Skin: Warm and dry.  No rash, erythema, lesions noted. ?Psychiatric: Normal mood and affect. Behavior is normal. Judgment and thought content normal.  ? ? ?Assessment & Plan:  ? ?See Encounters Tab for problem based charting. ? ?Patient discussed with Dr. [name] Highgrove ? ?

## 2021-10-23 NOTE — Telephone Encounter (Signed)
Received oncotype results of 22/18%. Patient is aware of the results.  Patient would like her xrt in [place].  I will place a referral.  Sent message to have her appts with Dr. Lisbeth Renshaw to be cancelled and notified team. ?

## 2021-10-23 NOTE — Assessment & Plan Note (Signed)
Patient is presenting for follow up of her meralgia paresthetica. She was evaluated for this on 3/20 and was recommended for lidocaine patches, avoiding tight fitting clothing and continued weight loss. She does report some improvement in symptoms; however, does continue to have pain on the right thigh that is limiting mobility.  ?Patient is interested in physical therapy and would also like to discuss options for nerve block. Discussed with patient's PCP; given ongoing symptoms, she would benefit from consideration for nerve block ? ?Plan: ?Referral to neurosurgery for nerve block of lateral femoral cutaneous nerve ?Continue to avoid tight fitting clothing  ?Continue with activity modification and weight loss  ?

## 2021-10-24 NOTE — Progress Notes (Signed)
Internal Medicine Clinic Attending  I saw and evaluated the patient.  I personally confirmed the key portions of the history and exam documented by Dr. Aslam and I reviewed pertinent patient test results.  The assessment, diagnosis, and plan were formulated together and I agree with the documentation in the resident's note.     

## 2021-10-28 ENCOUNTER — Encounter: Payer: Self-pay | Admitting: Physical Therapy

## 2021-10-28 ENCOUNTER — Ambulatory Visit: Payer: Medicare Other | Attending: General Surgery | Admitting: Physical Therapy

## 2021-10-28 DIAGNOSIS — Z170 Estrogen receptor positive status [ER+]: Secondary | ICD-10-CM | POA: Insufficient documentation

## 2021-10-28 DIAGNOSIS — G8929 Other chronic pain: Secondary | ICD-10-CM | POA: Diagnosis present

## 2021-10-28 DIAGNOSIS — R293 Abnormal posture: Secondary | ICD-10-CM | POA: Diagnosis present

## 2021-10-28 DIAGNOSIS — C50212 Malignant neoplasm of upper-inner quadrant of left female breast: Secondary | ICD-10-CM | POA: Insufficient documentation

## 2021-10-28 DIAGNOSIS — M25512 Pain in left shoulder: Secondary | ICD-10-CM | POA: Insufficient documentation

## 2021-10-28 NOTE — Patient Instructions (Signed)
After Breast Cancer Class ?It is recommended you attend the ABC class to be educated on lymphedema risk reduction. This class is free of charge and lasts for 1 hour. It is a 1-time class. You will need to download the Webex app either on your phone or computer. We will send you a link the night before or the morning of the class. You should be able to click on that link to join the class. This is not a confidential class. You don't have to turn your camera on, but other participants may be able to see your email address. You are scheduled for this class for April 17th at 11:00.  ? ?Scar massage ?You can begin gentle scar massage to you incision sites. Gently place one hand on the incision and move the skin (without sliding on the skin) in various directions. Do this for a few minutes and then you can gently massage either coconut oil or vitamin E cream into the scars. ? ?Compression garment ?You should continue wearing your compression bra until you feel like you no longer have swelling. ? ?Home exercise Program ?Continue doing the exercises you were given until you feel like you can do them without feeling any tightness at the end.  ? ?Walking Program ?Studies show that 30 minutes of walking per day (fast enough to elevate your heart rate) can significantly reduce the risk of a cancer recurrence. If you can't walk due to other medical reasons, we encourage you to find another activity you could do (like a stationary bike or water exercise). ? ?Posture ?After breast cancer surgery, people frequently sit with rounded shoulders posture because it puts their incisions on slack and feels better. If you sit like this and scar tissue forms in that position, you can become very tight and have pain sitting or standing with good posture. Try to be aware of your posture and sit and stand up tall to heal properly. ? ?Follow up PT: ?It is recommended you return every 3 months for the first 3 years following surgery to be  assessed on the SOZO machine for an L-Dex score. This helps prevent clinically significant lymphedema in 95% of patients. These follow up screens are 10 minute appointments that you are not billed for. You are scheduled for June 12th at 11:30. ? ?

## 2021-10-28 NOTE — Therapy (Signed)
?OUTPATIENT PHYSICAL THERAPY BREAST CANCER POST OP FOLLOW UP ? ? ?Patient Name: Breanna Wilkins ?MRN: [id number] ?DOB:1960-12-25, 61 y.o., female ?Today's Date: 10/28/2021 ? ? PT End of Session - 10/28/21 1257   ? ? Visit Number 2   ? Number of Visits 2   ? PT Start Time 1258   ? PT Stop Time 4599   ? PT Time Calculation (min) 47 min   ? Activity Tolerance Patient tolerated treatment well   ? Behavior During Therapy [facility] for tasks assessed/performed   ? ?  ?  ? ?  ? ? ?Past Medical History:  ?Diagnosis Date  ? Abdominal hernia   ? Arthritis   ? Breast cancer ([place])   ? CAD (coronary artery disease)   ? Perioperative non-STEMI March, 2014  //   cardiac catheterization at time of non-STEMI, March, 2014, minor nonobstructive coronary disease, vigorous LV function, possibly vasospastic event versus stress cardiomyopathy. No further workup of coronary disease  ? CHF (congestive heart failure) ([place])   ? Diverticulosis of colon 10/17/2017  ? Noted on colonoscopy 10/14/17  ? Ejection fraction   ? 65-70%, vigorous function, echo, March, 2011  ? Ejection fraction   ? Vigorous function at time of catheterization October 06, 2012,  ? GERD (gastroesophageal reflux disease)   ? History of colonic polyps 10/17/2017  ? Colonoscopy 10/14/17  ? Hyperlipidemia   ? Hypertension   ? Internal hemorrhoids without complication 77/41/4239  ? Noted on colonoscopy 10/14/17  ? Lung nodule 12/2009  ? Very small, left upper lobe by CT June, 2011, one-year followup was stable  ? Myocardial infarction [facility]) 09/2013  ? OSA (obstructive sleep apnea)   ? CPAP  ? Overweight(278.02)   ? Pulmonary embolus ([place]) 01/16/2010  ? on Coumadin indefinitely  ? Shortness of breath   ? Shoulder pain   ? Sleep apnea   ? Warfarin anticoagulation   ? ?Past Surgical History:  ?Procedure Laterality Date  ? ABDOMINAL HYSTERECTOMY  07/2002  ? laparoscopic assisted vaginal hysterectomy for menorrhagia, dysmenorrhea, anemia, fibroids  ? BREAST BIOPSY Left 09/03/2021  ? BREAST  LUMPECTOMY WITH RADIOACTIVE SEED AND SENTINEL LYMPH NODE BIOPSY Left 10/03/2021  ? Procedure: LEFT BREAST LUMPECTOMY WITH RADIOACTIVE SEED AND SENTINEL LYMPH NODE BIOPSY;  Surgeon: [name], MD;  Location: [facility];  Service: General;  Laterality: Left;  ? COLOSTOMY    ? INCISIONAL HERNIA REPAIR N/A 09/27/2012  ? Procedure: LAPAROSCOPIC INCISIONAL HERNIA possible open;  Surgeon: Adin Hector, MD;  Location: [place];  Service: General;  Laterality: N/A;  ? INSERTION OF MESH N/A 09/27/2012  ? Procedure: INSERTION OF MESH;  Surgeon: Adin Hector, MD;  Location: [place];  Service: General;  Laterality: N/A;  ? KNEE ARTHROSCOPY Left   ? LEFT HEART CATHETERIZATION WITH CORONARY ANGIOGRAM N/A 10/06/2012  ? Procedure: LEFT HEART CATHETERIZATION WITH CORONARY ANGIOGRAM;  Surgeon: Sherren Mocha, MD;  Location: [facility] CATH LAB;  Service: Cardiovascular;  Laterality: N/A;  ? ?Patient Active Problem List  ? Diagnosis Date Noted  ? Preop examination 10/02/2021  ? Genetic testing 09/27/2021  ? Family history of ovarian cancer 09/12/2021  ? Malignant neoplasm of upper-outer quadrant of left breast in female, estrogen receptor positive ([place]) 09/10/2021  ? Neck mass 02/22/2021  ? Musculoskeletal neck pain 05/22/2020  ? Leg pain, posterior, left 12/21/2019  ? Strain of rectus abdominis muscle 09/06/2019  ? Sciatica of left side 11/10/2018  ? Meralgia paraesthetica 10/20/2018  ? Chronic fatigue 10/01/2018  ?  Diverticulosis of colon 10/17/2017  ? Internal hemorrhoids without complication 76/54/6503  ? History of colonic polyps 10/17/2017  ? BRBPR (bright red blood per rectum) 08/25/2017  ? Chronic pain 07/10/2017  ? Long term (current) use of opiate analgesic 09/17/2016  ? Vitamin D deficiency 09/17/2016  ? Primary osteoarthritis of right knee 03/04/2016  ? Nipple dermatitis 11/20/2015  ? Prediabetes 06/07/2015  ? Morbid obesity with BMI of 40.0-44.9, adult ([place]) 06/07/2015  ? GERD (gastroesophageal reflux disease) 05/29/2014  ?  Atypical chest pain 02/23/2014  ? Plantar fasciitis of right foot 02/07/2014  ? Carpal tunnel syndrome of right wrist 12/05/2013  ? Pain of left calf 11/03/2013  ? Preventative health care 05/12/2013  ? CHF NYHA class II (symptoms with moderately strenuous activities) ([place]) 10/11/2012  ? Hyperlipidemia 08/31/2012  ? History of DVT (deep vein thrombosis) 03/20/2011  ? Long term (current) use of anticoagulants 08/09/2010  ? Supraspinatus tendon tear 02/11/2010  ? History of pulmonary embolism 01/16/2010  ? Severe obstructive sleep apnea 06/29/2009  ? Essential hypertension, benign 06/04/2009  ? ? ?PCP: Lucious Groves, DO ? ?REFERRING PROVIDER: [name], MD ? ?REFERRING DIAG: Left breast cancer ? ?THERAPY DIAG:  ?Malignant neoplasm of upper-inner quadrant of left breast in female, estrogen receptor positive ([place]) ? ?Abnormal posture ? ?Chronic left shoulder pain ? ?ONSET DATE: 10/03/2021 ? ?SUBJECTIVE:                                                                                                                                                                                          ? ?SUBJECTIVE STATEMENT: ?Patient reports she underwent a left lumpectomy and sentinel node biopsy (2 nodes - 1 negative and 1 with isolated tumor cells) on 10/03/2021. Her Oncotype score was 22 so no chemo is needed but she will proceed with radiation and anti-estrogen therapy. ? ?PERTINENT HISTORY:  ?Patient was diagnosed on 07/18/2022 with left grade I-II invasive ductal carcinoma breast cancer. She underwent a left lumpectomy and sentinel node biopsy (2 nodes - 1 negative and 1 with isolated tumor cells) on 10/03/2021. It is ER/PR positive and HER2 negative with a Ki67 of < 5%.  ? ?PATIENT GOALS:  Reassess how my recovery is going related to arm function, pain, and swelling. ? ?PAIN:  ?Are you having pain? No ? ?PRECAUTIONS: Recent Surgery, left UE Lymphedema risk ? ?ACTIVITY LEVEL / LEISURE: She is doing her UE HEP but is unable to  walk due to right leg sciatica. ? ? ?OBJECTIVE:  ? ?PATIENT SURVEYS:  ?QUICK DASH: ? Katina Dung - 10/28/21 0001   ? ? Open a tight or new jar Mild  difficulty   ? Do heavy household chores (wash walls, wash floors) Mild difficulty   ? Carry a shopping bag or briefcase No difficulty   ? Wash your back No difficulty   ? Use a knife to cut food No difficulty   ? Recreational activities in which you take some force or impact through your arm, shoulder, or hand (golf, hammering, tennis) No difficulty   ? During the past week, to what extent has your arm, shoulder or hand problem interfered with your normal social activities with family, friends, neighbors, or groups? Not at all   ? During the past week, to what extent has your arm, shoulder or hand problem limited your work or other regular daily activities Not at all   ? Arm, shoulder, or hand pain. Mild   ? Tingling (pins and needles) in your arm, shoulder, or hand Mild   ? Difficulty Sleeping Mild difficulty   ? DASH Score 11.36 %   ? ?  ?  ? ?  ? ? ? ?OBSERVATIONS: ? Left upper outer breast incision appears to be well healed. Mild scar tissue present. No significant edema present. ? ?POSTURE:  ?Forward head and rounded shoulders posture. ? ? ?UPPER EXTREMITY AROM/PROM: ?  ?A/PROM RIGHT  09/11/2021 ?   ?Shoulder extension 53  ?Shoulder flexion 138  ?Shoulder abduction 144  ?Shoulder internal rotation 68  ?Shoulder external rotation 70  ?                        (Blank rows = not tested) ?  ?A/PROM LEFT  09/11/2021 LEFT 10/28/2021  ?Shoulder extension 51 55  ?Shoulder flexion 138 141  ?Shoulder abduction 127 and painful 136  ?Shoulder internal rotation 71 67  ?Shoulder external rotation 75 86  ?                        (Blank rows = not tested) ?  ?  ?CERVICAL AROM: ?All within normal limits ?  ?  ?UPPER EXTREMITY STRENGTH: WNL ?  ?  ?LYMPHEDEMA ASSESSMENTS:  ?  ?LANDMARK RIGHT  09/11/2021 RIGHT 10/28/2021  ?10 cm proximal to olecranon process 38.8 38.2  ?Olecranon process  29.2 28.9  ?10 cm proximal to ulnar styloid process 23.7 22.5  ?Just proximal to ulnar styloid process 16.4 16.4  ?Across hand at thumb web space 19.5 20.2  ?At base of 2nd digit 6.7 6.5  ?(Blank rows = not te

## 2021-10-30 ENCOUNTER — Encounter: Payer: Self-pay | Admitting: *Deleted

## 2021-10-31 ENCOUNTER — Other Ambulatory Visit: Payer: Self-pay

## 2021-10-31 ENCOUNTER — Encounter: Payer: Self-pay | Admitting: *Deleted

## 2021-11-01 ENCOUNTER — Telehealth: Payer: Self-pay | Admitting: Hematology and Oncology

## 2021-11-01 NOTE — Telephone Encounter (Signed)
Per 4/13 in basket called pt to make an appointment.  Spoke to pt and pt confirmed appointment. ?

## 2021-11-04 ENCOUNTER — Encounter: Payer: Self-pay | Admitting: Genetic Counselor

## 2021-11-05 ENCOUNTER — Ambulatory Visit: Payer: Medicare Other | Admitting: Radiation Oncology

## 2021-11-05 ENCOUNTER — Encounter: Payer: Self-pay | Admitting: Licensed Clinical Social Worker

## 2021-11-05 ENCOUNTER — Ambulatory Visit: Payer: Medicare Other

## 2021-11-05 NOTE — Progress Notes (Signed)
[facility] CSW Progress Note ? ?Clinical Social Worker received TC from pt asking about gas assistance to radiation appts in [place]. These appts are through [facility], so CSW encouraged pt to ask that office about available assistance. CSW did complete and submit application for [employer] with pt.  Informed pt that she can be signed up for assistance when she comes to [facility] for appts. ? ? ? ? ?Christeen Douglas , LCSW ?

## 2021-11-07 ENCOUNTER — Encounter: Payer: Self-pay | Admitting: *Deleted

## 2021-11-19 ENCOUNTER — Telehealth: Payer: Self-pay | Admitting: Hematology and Oncology

## 2021-11-19 NOTE — Telephone Encounter (Signed)
I called [name], discussed about the oncotype result ?I explained that her oncotype category needs to be updated. ?At this score, there may be a small added benefit of chemotherapy. ?She is not very interested in doing chemotherapy if there is no substantial benefit ?I will keep her posted with the updated oncotype results. ? ?Breanna [facility]  ? ? ? ?

## 2021-11-21 ENCOUNTER — Encounter (HOSPITAL_COMMUNITY): Payer: Self-pay

## 2021-11-26 ENCOUNTER — Other Ambulatory Visit: Payer: Self-pay | Admitting: Hematology and Oncology

## 2021-11-26 NOTE — Progress Notes (Signed)
I called patient back. Discussed updated oncotype results. ?She has in the past expressed that she wouldn't want to consider chemotherapy unless there is a large benefit ?We discussed that the benefit will be less than 1% per updated oncotype resulted on 11/21/2021. ?Discussed with our NN team, left a message on Dr Corena Pilgrim phone from rad onc about the updated results last week ?Patient also is very thankful that the updated score suggested no absolute chemo benefit. ? ?Breanna [facility]  ? ? ?

## 2021-12-02 ENCOUNTER — Telehealth: Payer: Self-pay | Admitting: Physical Therapy

## 2021-12-02 NOTE — Telephone Encounter (Signed)
Returned pt's phone call. She has concerns that her hand is swelling. She lives out of town and asked if I could see her on 12/11/2021 when she comes to [facility] for another appointment. She agreed to come to [facility] that day and I can do the SOZO screening and see if she is developing lymphedema. She was agreeable to that. ?Annia Friendly, PT ?12/02/21 2:45 PM ? ?

## 2021-12-11 ENCOUNTER — Ambulatory Visit: Payer: Medicare Other | Attending: General Surgery | Admitting: Physical Therapy

## 2021-12-11 ENCOUNTER — Other Ambulatory Visit: Payer: Self-pay

## 2021-12-11 ENCOUNTER — Encounter: Payer: Self-pay | Admitting: Physical Therapy

## 2021-12-11 DIAGNOSIS — Z483 Aftercare following surgery for neoplasm: Secondary | ICD-10-CM | POA: Insufficient documentation

## 2021-12-11 NOTE — Therapy (Signed)
OUTPATIENT PHYSICAL THERAPY SOZO SCREENING NOTE   Patient Name: Breanna Wilkins MRN: [id number] DOB:[birth year], 61 y.o., female Today's Date: 12/11/2021  PCP: Lucious Groves, DO REFERRING PROVIDER: [name], MD   PT End of Session - 12/11/21 1154     Visit Number 2    Number of Visits 2    PT Start Time 1130    PT Stop Time 2010    PT Time Calculation (min) 15 min    Activity Tolerance Patient tolerated treatment well    Behavior During Therapy [facility] for tasks assessed/performed             Past Medical History:  Diagnosis Date   Abdominal hernia    Arthritis    Breast cancer ([place])    CAD (coronary artery disease)    Perioperative non-STEMI March, 2014  //   cardiac catheterization at time of non-STEMI, March, 2014, minor nonobstructive coronary disease, vigorous LV function, possibly vasospastic event versus stress cardiomyopathy. No further workup of coronary disease   CHF (congestive heart failure) ([place])    Diverticulosis of colon 10/17/2017   Noted on colonoscopy 10/14/17   Ejection fraction    65-70%, vigorous function, echo, March, 2011   Ejection fraction    Vigorous function at time of catheterization October 06, 2012,   GERD (gastroesophageal reflux disease)    History of colonic polyps 10/17/2017   Colonoscopy 10/14/17   Hyperlipidemia    Hypertension    Internal hemorrhoids without complication 01/29/1974   Noted on colonoscopy 10/14/17   Lung nodule 12/2009   Very small, left upper lobe by CT June, 2011, one-year followup was stable   Myocardial infarction ([place]) 09/2013   OSA (obstructive sleep apnea)    CPAP   Overweight(278.02)    Pulmonary embolus ([place]) 01/16/2010   on Coumadin indefinitely   Shortness of breath    Shoulder pain    Sleep apnea    Warfarin anticoagulation    Past Surgical History:  Procedure Laterality Date   ABDOMINAL HYSTERECTOMY  07/2002   laparoscopic assisted vaginal hysterectomy for menorrhagia, dysmenorrhea, anemia,  fibroids   BREAST BIOPSY Left 09/03/2021   BREAST LUMPECTOMY WITH RADIOACTIVE SEED AND SENTINEL LYMPH NODE BIOPSY Left 10/03/2021   Procedure: LEFT BREAST LUMPECTOMY WITH RADIOACTIVE SEED AND SENTINEL LYMPH NODE BIOPSY;  Surgeon: [name], MD;  Location: [facility];  Service: General;  Laterality: Left;   COLOSTOMY     INCISIONAL HERNIA REPAIR N/A 09/27/2012   Procedure: LAPAROSCOPIC INCISIONAL HERNIA possible open;  Surgeon: Adin Hector, MD;  Location: [facility];  Service: General;  Laterality: N/A;   INSERTION OF MESH N/A 09/27/2012   Procedure: INSERTION OF MESH;  Surgeon: Adin Hector, MD;  Location: [facility];  Service: General;  Laterality: N/A;   KNEE ARTHROSCOPY Left    LEFT HEART CATHETERIZATION WITH CORONARY ANGIOGRAM N/A 10/06/2012   Procedure: LEFT HEART CATHETERIZATION WITH CORONARY ANGIOGRAM;  Surgeon: Sherren Mocha, MD;  Location[facility] CATH LAB;  Service: Cardiovascular;  Laterality: N/A;   Patient Active Problem List   Diagnosis Date Noted   Preop examination 10/02/2021   Genetic testing 09/27/2021   Family history of ovarian cancer 09/12/2021   Malignant neoplasm of upper-outer quadrant of left breast in female, estrogen receptor positive ([place]) 09/10/2021   Neck mass 02/22/2021   Musculoskeletal neck pain 05/22/2020   Leg pain, posterior, left 12/21/2019   Strain of rectus abdominis muscle 09/06/2019   Sciatica of left side 11/10/2018   Meralgia paraesthetica  10/20/2018   Chronic fatigue 10/01/2018   Diverticulosis of colon 10/17/2017   Internal hemorrhoids without complication 82/50/0370   History of colonic polyps 10/17/2017   BRBPR (bright red blood per rectum) 08/25/2017   Chronic pain 07/10/2017   Long term (current) use of opiate analgesic 09/17/2016   Vitamin D deficiency 09/17/2016   Primary osteoarthritis of right knee 03/04/2016   Nipple dermatitis 11/20/2015   Prediabetes 06/07/2015   Morbid obesity with BMI of 40.0-44.9, adult ([place]) 06/07/2015   GERD  (gastroesophageal reflux disease) 05/29/2014   Atypical chest pain 02/23/2014   Plantar fasciitis of right foot 02/07/2014   Carpal tunnel syndrome of right wrist 12/05/2013   Pain of left calf 11/03/2013   Preventative health care 05/12/2013   CHF NYHA class II (symptoms with moderately strenuous activities) ([place]) 10/11/2012   Hyperlipidemia 08/31/2012   History of DVT (deep vein thrombosis) 03/20/2011   Long term (current) use of anticoagulants 08/09/2010   Supraspinatus tendon tear 02/11/2010   History of pulmonary embolism 01/16/2010   Severe obstructive sleep apnea 06/29/2009   Essential hypertension, benign 06/04/2009    REFERRING DIAG: left breast cancer at risk for lymphedema  THERAPY DIAG:  Aftercare following surgery for neoplasm  PERTINENT HISTORY: Patient was diagnosed on 07/18/2022 with left grade I-II invasive ductal carcinoma breast cancer. She underwent a left lumpectomy and sentinel node biopsy (2 nodes - 1 negative and 1 with isolated tumor cells) on 10/03/2021. It is ER/PR positive and HER2 negative with a Ki67 of < 5%.     PRECAUTIONS: left UE Lymphedema risk  SUBJECTIVE: Pt at the cancer center today for a SOZO screen. She c/o new bilateral leg edema. With her hx of heart failure, I encouraged her to contact her cardiologist  PAIN:  Are you having pain? Yes - bilateral leg pain which is chronic  SOZO SCREENING: Patient was assessed today using the SOZO machine to determine the lymphedema index score. This was compared to her baseline score. It was determined that she is within the recommended range when compared to her baseline and no further action is needed at this time. She will continue SOZO screenings. These are done every 3 months for 2 years post operatively followed by every 6 months for 2 years, and then annually.   L-DEX FLOWSHEETS - 12/11/21 1100       L-DEX LYMPHEDEMA SCREENING   Measurement Type Unilateral    L-DEX MEASUREMENT EXTREMITY Upper  Extremity    POSITION  Standing    DOMINANT SIDE Right    At Risk Side Left    BASELINE SCORE (UNILATERAL) 6    L-DEX SCORE (UNILATERAL) 5.1    VALUE CHANGE (UNILAT) -0.9             Annia Friendly, PT 12/11/21 11:58 AM

## 2021-12-17 ENCOUNTER — Other Ambulatory Visit: Payer: Self-pay

## 2021-12-18 MED ORDER — HYDROCODONE-ACETAMINOPHEN 10-325 MG PO TABS: 1.0000 | ORAL_TABLET | Freq: Four times a day (QID) | ORAL | 0 refills | Status: DC | PRN

## 2021-12-19 ENCOUNTER — Other Ambulatory Visit: Payer: Self-pay | Admitting: Pain Medicine

## 2021-12-19 DIAGNOSIS — M5416 Radiculopathy, lumbar region: Secondary | ICD-10-CM

## 2021-12-20 ENCOUNTER — Inpatient Hospital Stay: Payer: Medicare Other | Attending: Hematology and Oncology | Admitting: Hematology and Oncology

## 2021-12-20 ENCOUNTER — Telehealth: Payer: Self-pay | Admitting: *Deleted

## 2021-12-20 NOTE — Progress Notes (Incomplete)
[place] NOTE  Patient Care Team: Lucious Groves, DO as PCP - General (Internal Medicine) Belva Crome, MD as PCP - Cardiology (Cardiology) Pleasant, Eppie Gibson, RN as [place] Management [name], MD as Consulting Physician (General Surgery) Benay Pike, MD as Consulting Physician (Hematology and Oncology) Kyung Rudd, MD as Consulting Physician (Radiation Oncology) Rockwell Germany, RN as Oncology Nurse Navigator Mauro Kaufmann, RN as Oncology Nurse Navigator  CHIEF COMPLAINTS/PURPOSE OF CONSULTATION:  Newly diagnosed breast cancer  HISTORY OF PRESENTING ILLNESS:  Breanna Wilkins 61 y.o. female is here because of recent diagnosis of right breast IDC  07/18/2021 screening mammogram showed possible mass in the left breast seen in the upper left breast, no suspicious findings in the right breast. 08/15/2021, unilateral left diagnostic mammogram showed left breast mass measuring 13 x 12 x 13 mm, irregular hypoechoic mass 10 cm from the nipple, no left axillary adenopathy. Left breast biopsy showed invasive mammary carcinoma grade 1 through 2 of 3 measuring 1.9 cm in greatest linear extent prognostics show ER 100% positive, moderate staining intensity, PR 100% positive, strong staining intensity, KI of less than 5%.  E-cadherin is positive supporting a ductal origin. She was referred to breast [place] for additional recommendations. I reviewed her records extensively and collaborated the history with the patient.  SUMMARY OF ONCOLOGIC HISTORY: Oncology History  Malignant neoplasm of upper-outer quadrant of left breast in female, estrogen receptor positive ([place])  09/10/2021 Initial Diagnosis   Malignant neoplasm of upper-outer quadrant of left breast in female, estrogen receptor positive ([place])    Genetic Testing   [facility] CancerNext-Expanded panel was Negative. Of note, a variant of uncertain significance was detected in the BRCA1 gene  (p.[id number]). Report date is 09/30/2021.   The CancerNext-Expanded gene panel offered by [facility] and includes sequencing, rearrangement, and RNA analysis for the following 77 genes: AIP, ALK, APC, ATM, AXIN2, BAP1, BARD1, BLM, BMPR1A, BRCA1, BRCA2, BRIP1, CDC73, CDH1, CDK4, CDKN1B, CDKN2A, CHEK2, CTNNA1, DICER1, FANCC, FH, FLCN, GALNT12, KIF1B, LZTR1, MAX, MEN1, MET, MLH1, MSH2, MSH3, MSH6, MUTYH, NBN, NF1, NF2, NTHL1, PALB2, PHOX2B, PMS2, POT1, PRKAR1A, PTCH1, PTEN, RAD51C, RAD51D, RB1, RECQL, RET, SDHA, SDHAF2, SDHB, SDHC, SDHD, SMAD4, SMARCA4, SMARCB1, SMARCE1, STK11, SUFU, TMEM127, TP53, TSC1, TSC2, VHL and XRCC2 (sequencing and deletion/duplication); EGFR, EGLN1, HOXB13, KIT, MITF, PDGFRA, POLD1, and POLE (sequencing only); EPCAM and GREM1 (deletion/duplication only).       MEDICAL HISTORY:  Past Medical History:  Diagnosis Date   Abdominal hernia    Arthritis    Breast cancer ([place])    CAD (coronary artery disease)    Perioperative non-STEMI March, 2014  //   cardiac catheterization at time of non-STEMI, March, 2014, minor nonobstructive coronary disease, vigorous LV function, possibly vasospastic event versus stress cardiomyopathy. No further workup of coronary disease   CHF (congestive heart failure) ([place])    Diverticulosis of colon 10/17/2017   Noted on colonoscopy 10/14/17   Ejection fraction    65-70%, vigorous function, echo, March, 2011   Ejection fraction    Vigorous function at time of catheterization October 06, 2012,   GERD (gastroesophageal reflux disease)    History of colonic polyps 10/17/2017   Colonoscopy 10/14/17   Hyperlipidemia    Hypertension    Internal hemorrhoids without complication 76/80/8811   Noted on colonoscopy 10/14/17   Lung nodule 12/2009   Very small, left upper lobe by CT June, 2011, one-year followup was stable   Myocardial infarction (  [place]) 09/2013   OSA (obstructive sleep apnea)    CPAP   Overweight(278.02)    Pulmonary embolus ([place])  01/16/2010   on Coumadin indefinitely   Shortness of breath    Shoulder pain    Sleep apnea    Warfarin anticoagulation     SURGICAL HISTORY: Past Surgical History:  Procedure Laterality Date   ABDOMINAL HYSTERECTOMY  07/2002   laparoscopic assisted vaginal hysterectomy for menorrhagia, dysmenorrhea, anemia, fibroids   BREAST BIOPSY Left 09/03/2021   BREAST LUMPECTOMY WITH RADIOACTIVE SEED AND SENTINEL LYMPH NODE BIOPSY Left 10/03/2021   Procedure: LEFT BREAST LUMPECTOMY WITH RADIOACTIVE SEED AND SENTINEL LYMPH NODE BIOPSY;  Surgeon: [name], MD;  Location: [place];  Service: General;  Laterality: Left;   [place] N/A 09/27/2012   Procedure: LAPAROSCOPIC INCISIONAL HERNIA possible open;  Surgeon: Adin Hector, MD;  Location: [place];  Service: General;  Laterality: N/A;   INSERTION OF MESH N/A 09/27/2012   Procedure: INSERTION OF MESH;  Surgeon: Adin Hector, MD;  Location: [place];  Service: General;  Laterality: N/A;   KNEE ARTHROSCOPY Left    LEFT HEART CATHETERIZATION WITH CORONARY ANGIOGRAM N/A 10/06/2012   Procedure: LEFT HEART CATHETERIZATION WITH CORONARY ANGIOGRAM;  Surgeon: Sherren Mocha, MD;  Location: [facility] CATH LAB;  Service: Cardiovascular;  Laterality: N/A;    SOCIAL HISTORY: Social History   Socioeconomic History   Marital status: Single    Spouse name: Not on file   Number of children: 0   Years of education: 12   Highest education level: 10th grade  Occupational History   Occupation: Disabled  Tobacco Use   Smoking status: Former    Types: Cigarettes    Quit date: 05/03/1988    Years since quitting: 33.6    Passive exposure: Never   Smokeless tobacco: Never  Vaping Use   Vaping Use: Never used  Substance and Sexual Activity   Alcohol use: Yes    Alcohol/week: 1.0 standard drink    Types: 1 Cans of beer per week    Comment: Wine sometimes.   Drug use: Never   Sexual activity: Not Currently  Other Topics Concern    Not on file  Social History Narrative   Financial assistance approved for 100% discount at [facility] and has [facility] card per [place]6/01/2010      Current Social History 02/11/2021        Patient lives with sister in a home  that is 1 story. There are 4 steps up to the entrance the patient uses. There is a railing.       Patient's method of transportation is personal car.      The highest level of education was some high school.      The patient currently is employed as aid on school bus.      Identified important Relationships are "my sister"       Pets : 0       Interests / Fun: "I like to fish and I love my job"       Current Stressors: "none"       Religious / Personal Beliefs: "I don't know"        Social Determinants of Health   Financial Resource Strain: Not on file  Food Insecurity: No Food Insecurity   Worried About Charity fundraiser in the Last Year: Never true   [place] in the Last Year: Never true  Transportation Needs: No Data processing manager (Medical): No   Lack of Transportation (Non-Medical): No  Physical Activity: Not on file  Stress: No Stress Concern Present   Feeling of Stress : Not at all  Social Connections: Not on file  Intimate Partner Violence: Not At Risk   Fear of Current or Ex-Partner: No   Emotionally Abused: No   Physically Abused: No   Sexually Abused: No    FAMILY HISTORY: Family History  Problem Relation Age of Onset   Diabetes Mother    Ovarian cancer Mother 47   Diabetes Father    Diabetes Sister    Colon polyps Sister    Brain cancer Maternal Aunt        unsure if it was a brain primary or cancer that metastasized to the brain   Cancer Paternal Uncle        unknown type   Bone cancer Maternal Grandmother        unsure if it was a bone primary or cancer that metastasized to the bones   Breast cancer Neg Hx    Colon cancer Neg Hx    Esophageal cancer Neg Hx    Rectal cancer Neg Hx     Stomach cancer Neg Hx     ALLERGIES:  has No Known Allergies.  MEDICATIONS:  Current Outpatient Medications  Medication Sig Dispense Refill   aspirin 81 MG chewable tablet Chew 1 tablet (81 mg total) by mouth daily. 30 tablet 1   carvedilol (COREG) 6.25 MG tablet Take 1 tablet (6.25 mg total) by mouth 2 (two) times daily with a meal. 180 tablet 3   Cholecalciferol (VITAMIN D) 50 MCG (2000 UT) CAPS Take 2,000 Units by mouth daily.     cyclobenzaprine (FEXMID) 7.5 MG tablet Take 1 tablet (7.5 mg total) by mouth 3 (three) times daily as needed for muscle spasms. 30 tablet 1   diclofenac Sodium (VOLTAREN) 1 % GEL Apply 4g to knee four times a day as needed for pain, apply 2g to shoulder as needed for pain (Patient not taking: Reported on 09/24/2021) 350 g 3   furosemide (LASIX) 20 MG tablet Take 1 tablet (20 mg total) by mouth daily. 90 tablet 1   [START ON 12/25/2021] HYDROcodone-acetaminophen (NORCO) 10-325 MG tablet Take 1 tablet by mouth every 6 (six) hours as needed for severe pain. 100 tablet 0   Insulin Pen Needle (NOVOFINE) 32G X 6 MM MISC 1 Units by Does not apply route daily. 100 each 1   lidocaine (LIDODERM) 5 % Place 1 patch onto the skin daily. Apply patch to right hip as needed 15 patch 0   lidocaine (XYLOCAINE) 5 % ointment Apply 1 application topically 2 (two) times daily as needed. (Patient not taking: Reported on 09/24/2021) 150 g 1   olmesartan (BENICAR) 40 MG tablet Take 1 tablet (40 mg total) by mouth daily. 90 tablet 3   polyethylene glycol (MIRALAX / GLYCOLAX) packet Take 17 g by mouth daily.     pregabalin (LYRICA) 50 MG capsule Take 1 capsule (50 mg total) by mouth 3 (three) times daily. 90 capsule 2   rivaroxaban (XARELTO) 20 MG TABS tablet Take 1 tablet (20 mg total) by mouth daily with supper. 90 tablet 3   rosuvastatin (CRESTOR) 5 MG tablet Take 1 tablet (5 mg total) by mouth at bedtime. 90 tablet 3   No current facility-administered medications for this visit.    REVIEW  OF SYSTEMS:  Constitutional: Denies fevers, chills or abnormal night sweats Eyes: Denies blurriness of vision, double vision or watery eyes Ears, nose, mouth, throat, and face: Denies mucositis or sore throat Respiratory: Denies cough, dyspnea or wheezes Cardiovascular: Denies palpitation, chest discomfort or lower extremity swelling Gastrointestinal:  Denies nausea, heartburn or change in bowel habits Skin: Denies abnormal skin rashes Lymphatics: Denies new lymphadenopathy or easy bruising Neurological:Denies numbness, tingling or new weaknesses Behavioral/Psych: Mood is stable, no new changes  Breast: Palpable left breast lump-ecchymosis reported. All other systems were reviewed with the patient and are negative.  PHYSICAL EXAMINATION: ECOG PERFORMANCE STATUS: 0 - Asymptomatic  There were no vitals filed for this visit.  There were no vitals filed for this visit.   GENERAL:alert, no distress and comfortable, obese SKIN: skin color, texture, turgor are normal, no rashes or significant lesions EYES: normal, conjunctiva are pink and non-injected, sclera clear OROPHARYNX:no exudate, no erythema and lips, buccal mucosa, and tongue normal  NECK: supple, thyroid normal size, non-tender, without nodularity LYMPH:  no palpable lymphadenopathy in the cervical, axillary or inguinal LUNGS: clear to auscultation and percussion with normal breathing effort HEART: regular rate & rhythm and no murmurs and no lower extremity edema ABDOMEN:abdomen soft, non-tender and normal bowel sounds Musculoskeletal:no cyanosis of digits and no clubbing  PSYCH: alert & oriented x 3 with fluent speech NEURO: no focal motor/sensory deficits BREAST: Palpable left breast mass in the upper outer quadrant measuring under 2 cm on the largest dimension.  No palpable left axillary mass.  There is a small soft lump in the right cervical region which according to the patient has been evaluated in the past.  This did not  appear pathologic  LABORATORY DATA:  I have reviewed the data as listed Lab Results  Component Value Date   WBC 6.3 09/11/2021   HGB 13.1 09/11/2021   HCT 40.8 09/11/2021   MCV 92.3 09/11/2021   PLT 264 09/11/2021   Lab Results  Component Value Date   NA 142 09/11/2021   K 4.1 09/11/2021   CL 105 09/11/2021   CO2 32 09/11/2021    RADIOGRAPHIC STUDIES: I have personally reviewed the radiological reports and agreed with the findings in the report.  ASSESSMENT AND PLAN:  No problem-specific Assessment & Plan notes found for this encounter.   All questions were answered. The patient knows to call the clinic with any problems, questions or concerns.    Benay Pike, MD 12/20/21

## 2021-12-20 NOTE — Telephone Encounter (Signed)
This RN attempted to call per no show for follow up today. Obtained VM- message left regarding appt and requested a return call. This RN's name given for contact.  Noted pt was seen at [facility] in [place] for radiation therapy per [place].

## 2021-12-23 ENCOUNTER — Other Ambulatory Visit: Payer: Self-pay | Admitting: *Deleted

## 2021-12-23 ENCOUNTER — Encounter: Payer: Self-pay | Admitting: *Deleted

## 2021-12-23 NOTE — Patient Instructions (Signed)
Visit Information  Thank you for taking time to visit with me today. Please don't hesitate to contact me if I can be of assistance to you before our next scheduled telephone appointment.  Following are the goals we discussed today:  Current Barriers:  Knowledge Deficits related to plan of care for management of HTN   RNCM Clinical Goal(s):  Patient will verbalize understanding of plan for management of HTN as evidenced by Continuation of monitoring blood pressure and adhering to 2300 mg sodium diet through collaboration with RN Care manager, provider, and care team.   Interventions: Inter-disciplinary care team collaboration (see longitudinal plan of care) Evaluation of current treatment plan related to  self management and patient's adherence to plan as established by provider    Patient Goals/Self-Care Activities: Take medications as prescribed   Attend all scheduled provider appointments Call pharmacy for medication refills 3-7 days in advance of running out of medications Attend church or other social activities Perform all self care activities independently  Perform IADL's (shopping, preparing meals, housekeeping, managing finances) independently Call provider office for new concerns or questions  check blood pressure daily choose a place to take my blood pressure (home, clinic or office, retail store) learn about high blood pressure take blood pressure log to all doctor appointments call doctor for signs and symptoms of high blood pressure develop an action plan for high blood pressure keep all doctor appointments take medications for blood pressure exactly as prescribed begin an exercise program report new symptoms to your doctor limit salt intake to 2300 mg/day   [id number] Per patient she has not been monitoring her blood pressure. Her readings had been high at the Dr office. Patient had not lost any weight since last outreach. She stated that she had gotten off her diet.  She has not had any falls since last outreach. She has been taking her medications as per ordered. RN reiterated the importance of exercising.   [id number] Per patient her blood pressure is better. She will have her last radiation treatment on 12/25/2021.  Per patient when she is walking she has pain in back, hip and leg. Patient stated that her lt thigh is swollen. She will be having a Mri on the 18 th. She will be receiving physical therapy. She has not had any recent falls. She is taking her medications as per ordered.  She had missed some of her appointments due to her forgetting. Patient sent the patient a calendar book to keep a record.   Our next appointment is by telephone on March 27, 2022  Please call Johny Shock RN [phone]  if you need to cancel or reschedule your appointment.   Please call the Suicide and Crisis Lifeline: 988 if you are experiencing a Mental Health or [place] or need someone to talk to.  The patient verbalized understanding of instructions, educational materials, and care plan provided today and agreed to receive a mailed copy of patient instructions, educational materials, and care plan.   Telephone follow up appointment with care management team member scheduled for: The patient has been provided with contact information for the care management team and has been advised to call with any health related questions or concerns.   [place] Care Management [phone]

## 2021-12-23 NOTE — Patient Outreach (Signed)
.[facility]) Care Management RN Health Coach Note   12/23/2021 Name:  Breanna Wilkins MRN:  [id number] DOB:  08/17/1960  Summary: Blood pressure is better. She will have her last radiation treatment on 12/25/2021.  Per patient when she is walking she has pain in back, hip and leg. Patient stated that her lt thigh is swollen. She will be having a Mri on the 18 th. She will be receiving physical therapy. She has not had any recent falls. She is taking her medications as per ordered.  She had missed some of her appointments due to her forgetting. Patient sent the patient a calendar book to keep a record.   Recommendations/Changes made from today's visit: Continue physical therapy Keep appointments RN sent Calendar book to keep appointment cards and write down appt and questions Medication adherence  Subjective: Breanna Wilkins is an 61 y.o. year old female who is a primary patient of Lucious Groves, DO. The care management team was consulted for assistance with care management and/or care coordination needs.    RN Health Coach completed Telephone Visit today.   Objective:  Medications Reviewed Today     Reviewed by Toribio Harbour, PT (Physical Therapist) on 12/11/21 at 1154  Med List Status: <None>   Medication Order Taking? Sig Documenting Provider Last Dose Status Informant  aspirin 81 MG chewable tablet [id number] No Chew 1 tablet (81 mg total) by mouth daily. Hosie Poisson, MD 09/30/2021 Active Self  carvedilol (COREG) 6.25 MG tablet [id number]  Take 1 tablet (6.25 mg total) by mouth 2 (two) times daily with a meal. Lucious Groves, DO  Active   Cholecalciferol (VITAMIN D) 50 MCG (2000 UT) CAPS [id number] No Take 2,000 Units by mouth daily. [provider] 10/02/2021 Active Self  cyclobenzaprine (FEXMID) 7.5 MG tablet [id number]  Take 1 tablet (7.5 mg total) by mouth 3 (three) times daily as needed for muscle spasms. Lucious Groves, DO  Active   diclofenac  Sodium (VOLTAREN) 1 % GEL [id number] No Apply 4g to knee four times a day as needed for pain, apply 2g to shoulder as needed for pain  Patient not taking: Reported on 09/24/2021   Lucious Groves, DO Not Taking Active Self  furosemide (LASIX) 20 MG tablet [id number]  Take 1 tablet (20 mg total) by mouth daily. Lucious Groves, DO  Active   HYDROcodone-acetaminophen [facility]) 10-325 MG tablet [id number] No Take 1 tablet by mouth every 6 (six) hours as needed for severe pain. Lucious Groves, DO 10/03/2021 0545 Active   HYDROcodone-acetaminophen (NORCO) 10-325 MG tablet [id number]  Take 1 tablet by mouth every 6 (six) hours as needed for severe pain. Lucious Groves, DO  Active   HYDROcodone-acetaminophen (NORCO) 10-325 MG tablet [id number]  Take 1 tablet by mouth every 6 (six) hours as needed for severe pain. Lucious Groves, DO  Active   Insulin Pen Needle (NOVOFINE) 32G X 6 MM MISC [id number] No 1 Units by Does not apply route daily. Lucious Groves, DO Taking Active Self  lidocaine (LIDODERM) 5 % [id number]  Place 1 patch onto the skin daily. Apply patch to right hip as needed Lajean Manes, MD  Active   lidocaine (XYLOCAINE) 5 % ointment [id number] No Apply 1 application topically 2 (two) times daily as needed.  Patient not taking: Reported on 09/24/2021   Lucious Groves, DO Not Taking Active Self  olmesartan (BENICAR) 40 MG tablet [id number] No Take 1 tablet (40 mg  total) by mouth daily. Lucious Groves, DO 10/02/2021 Active Self  oxyCODONE (OXY IR/ROXICODONE) 5 MG immediate release tablet [id number]  Take 1 tablet (5 mg total) by mouth every 6 (six) hours as needed for severe pain. [name], MD  Active   polyethylene glycol [facility] / GLYCOLAX) packet [id number] No Take 17 g by mouth daily. [provider] Past Week Active Self  pregabalin (LYRICA) 50 MG capsule [id number]  Take 1 capsule (50 mg total) by mouth 3 (three) times daily. Lucious Groves, DO  Active   rivaroxaban (XARELTO) 20 MG TABS  tablet [id number] No Take 1 tablet (20 mg total) by mouth daily with supper. Lucious Groves, DO 09/30/2021 Active Self  rosuvastatin (CRESTOR) 5 MG tablet [id number] No Take 1 tablet (5 mg total) by mouth at bedtime. Lucious Groves, DO 10/02/2021 Active Self           Med Note Wilmon Pali, MELISSA R   Tue Sep 24, 2021 12:56 PM)    Med List Note Merceda Elks, CPhT 09/24/21 1258): Pt has CPAP Machine             SDOH:  (Social Determinants of Health) assessments and interventions performed:    Care Plan  Review of patient past medical history, allergies, medications, health status, including review of consultants reports, laboratory and other test data, was performed as part of comprehensive evaluation for care management services.   Care Plan : RN Care Manager Plan of Care  Updates made by Mckinnon Glick, Eppie Gibson, RN since 12/23/2021 12:00 AM     Problem: Knowledge Deficit  Related to Hypertension and Care Coordination Needs   Priority: High     Long-Range Goal: Development  Plan Of Care for Management of Hypertension   Start Date: 07/02/2021  Expected End Date: 07/19/2022  Priority: High  Note:   Current Barriers:  Knowledge Deficits related to plan of care for management of HTN   RNCM Clinical Goal(s):  Patient will verbalize understanding of plan for management of HTN as evidenced by Continuation of monitoring blood pressure and adhering to 2300 mg sodium diet  through collaboration with RN Care manager, provider, and care team.   Interventions: Inter-disciplinary care team collaboration (see longitudinal plan of care) Evaluation of current treatment plan related to  self management and patient's adherence to plan as established by provider    Patient Goals/Self-Care Activities: Take medications as prescribed   Attend all scheduled provider appointments Call pharmacy for medication refills 3-7 days in advance of running out of medications Attend church or other social  activities Perform all self care activities independently  Perform IADL's (shopping, preparing meals, housekeeping, managing finances) independently Call provider office for new concerns or questions  check blood pressure daily choose a place to take my blood pressure (home, clinic or office, retail store) learn about high blood pressure take blood pressure log to all doctor appointments call doctor for signs and symptoms of high blood pressure develop an action plan for high blood pressure keep all doctor appointments take medications for blood pressure exactly as prescribed begin an exercise program report new symptoms to your doctor limit salt intake to 2300 mg/day   [id number] Per patient she has not been monitoring her blood pressure. Her readings had been high at the Dr office. Patient had not lost any weight since last outreach. She stated that she had gotten off her diet. She has not had any falls since last outreach. She has been taking her  medications as per ordered. RN reiterated the importance of exercising.   [id number] Per patient her blood pressure is better. She will have her last radiation treatment on 12/25/2021.  Per patient when she is walking she has pain in back, hip and leg. Patient stated that her lt thigh is swollen. She will be having a Mri on the 18 th. She will be receiving physical therapy. She has not had any recent falls. She is taking her medications as per ordered.  She had missed some of her appointments due to her forgetting. Patient sent the patient a calendar book to keep a record.       Plan: Telephone follow up appointment with care management team member scheduled for:  March 27, 2022 The patient has been provided with contact information for the care management team and has been advised to call with any health related questions or concerns.   [place] Care Management [phone]

## 2021-12-24 ENCOUNTER — Telehealth: Payer: Self-pay | Admitting: Hematology and Oncology

## 2021-12-24 ENCOUNTER — Telehealth: Payer: Self-pay

## 2021-12-24 NOTE — Telephone Encounter (Signed)
Pt called / informed Hydrocodone rx written to be picked up on 6/7.

## 2021-12-24 NOTE — Telephone Encounter (Signed)
.  Called patient to schedule appointment per 6/5 inbasket, patient is aware of date and time.   

## 2021-12-24 NOTE — Telephone Encounter (Signed)
HYDROcodone-acetaminophen (NORCO) 10-325 MG tablet, REFILL REQUEST @ [place], [place].

## 2021-12-27 ENCOUNTER — Encounter: Payer: Self-pay | Admitting: *Deleted

## 2021-12-30 ENCOUNTER — Ambulatory Visit: Payer: Medicare Other | Admitting: Physical Therapy

## 2022-01-01 ENCOUNTER — Other Ambulatory Visit: Payer: Self-pay

## 2022-01-03 ENCOUNTER — Other Ambulatory Visit: Payer: Self-pay

## 2022-01-05 ENCOUNTER — Ambulatory Visit
Admission: RE | Admit: 2022-01-05 | Discharge: 2022-01-05 | Disposition: A | Discharge status: Home / Self Care | Payer: Medicare Other | Source: Ambulatory Visit | Attending: Pain Medicine | Admitting: Pain Medicine

## 2022-01-05 DIAGNOSIS — M5416 Radiculopathy, lumbar region: Secondary | ICD-10-CM

## 2022-01-05 MED ORDER — GADOBENATE DIMEGLUMINE 529 MG/ML IV SOLN
20.0000 mL | Freq: Once | INTRAVENOUS | Status: AC | PRN
  Administered 2022-01-05: 20 mL via INTRAVENOUS

## 2022-01-08 ENCOUNTER — Telehealth: Payer: Self-pay | Admitting: Hematology and Oncology

## 2022-01-08 NOTE — Telephone Encounter (Signed)
Rescheduled appointment per providers template. Patient aware.  ?

## 2022-01-14 ENCOUNTER — Inpatient Hospital Stay: Payer: Medicare Other | Admitting: Hematology and Oncology

## 2022-01-16 ENCOUNTER — Ambulatory Visit (INDEPENDENT_AMBULATORY_CARE_PROVIDER_SITE_OTHER): Payer: Medicare Other | Admitting: Internal Medicine

## 2022-01-16 ENCOUNTER — Encounter: Payer: Self-pay | Admitting: Internal Medicine

## 2022-01-16 ENCOUNTER — Other Ambulatory Visit: Payer: Self-pay

## 2022-01-16 VITALS — BP 157/81 | HR 59 | Temp 98.0°F | Ht 67.0 in | Wt 294.7 lb

## 2022-01-16 DIAGNOSIS — M79672 Pain in left foot: Secondary | ICD-10-CM

## 2022-01-16 DIAGNOSIS — M5432 Sciatica, left side: Secondary | ICD-10-CM | POA: Diagnosis not present

## 2022-01-16 DIAGNOSIS — G8929 Other chronic pain: Secondary | ICD-10-CM

## 2022-01-16 DIAGNOSIS — E559 Vitamin D deficiency, unspecified: Secondary | ICD-10-CM | POA: Diagnosis not present

## 2022-01-16 DIAGNOSIS — Z6841 Body Mass Index (BMI) 40.0 and over, adult: Secondary | ICD-10-CM

## 2022-01-16 DIAGNOSIS — M1711 Unilateral primary osteoarthritis, right knee: Secondary | ICD-10-CM | POA: Diagnosis not present

## 2022-01-16 DIAGNOSIS — I10 Essential (primary) hypertension: Secondary | ICD-10-CM

## 2022-01-16 MED ORDER — HYDROCODONE-ACETAMINOPHEN 10-325 MG PO TABS: 1.0000 | ORAL_TABLET | Freq: Four times a day (QID) | ORAL | 0 refills | Status: DC | PRN

## 2022-01-16 MED ORDER — HYDROCODONE-ACETAMINOPHEN 10-325 MG PO TABS
1.0000 | ORAL_TABLET | Freq: Four times a day (QID) | ORAL | 0 refills | Status: DC | PRN
Start: 2022-01-25 — End: 2022-04-22

## 2022-01-16 MED ORDER — HYDROCODONE-ACETAMINOPHEN 10-325 MG PO TABS
1.0000 | ORAL_TABLET | Freq: Four times a day (QID) | ORAL | 0 refills | Status: DC | PRN
Start: 2022-03-26 — End: 2022-04-22

## 2022-01-16 NOTE — Progress Notes (Signed)
Established Patient Office Visit  Subjective   Patient ID: Breanna Wilkins, female    DOB: 08-13-1960  Age: 61 y.o. MRN: [id number]  Chief Complaint  Patient presents with   Follow-up   Left foot cramp    Breanna Wilkins presents today to follow-up chronic pain issues including her right knee and back, as well as with complaints of some left foot cramping.  For her chronic pain issues she was seen by Dr. Davy Pique with [facility].  She had an MRI which does show some degenerative changes as well as severe facet impingement at the left L4-L5 space this would correlate with her intermittent left leg and foot cramping pain.  She has not yet followed up to discuss the MRI finding.  She does note that she has been breaking her hydrocodone pill in half to take every night and think she would be better served if she had a full pill.  Also complaining of her chronic right knee pain she would like another steroid injection to this area.  She continues to follow-up with oncology she notes that she is doing well from her breast cancer treatment standpoint.  Her weight is up a little bit she has been off Ozempic due to a concern that it could have contributed to her breast cancer I have reassured her I do not think this was causing it and if she wanted to resume I would be supportive as her weight is a major morbid condition.       Objective:     BP (!) 157/81 (BP Location: Right Arm, Patient Position: Sitting, Cuff Size: Large)   Pulse (!) 59   Temp 98 F (36.7 C) (Oral)   Ht '5\' 7"'$  (1.702 m)   Wt 294 lb 11.2 oz (133.7 kg)   SpO2 100% Comment: RA  BMI 46.16 kg/m  BP Readings from Last 3 Encounters:  01/16/22 (!) 157/81  10/23/21 128/78  10/07/21 (!) 151/56   Wt Readings from Last 3 Encounters:  01/16/22 294 lb 11.2 oz (133.7 kg)  10/23/21 284 lb 9.6 oz (129.1 kg)  10/07/21 281 lb 4.8 oz (127.6 kg)      Physical Exam Constitutional:      Appearance: Normal  appearance. She is obese.  Cardiovascular:     Rate and Rhythm: Normal rate and regular rhythm.  Pulmonary:     Effort: Pulmonary effort is normal.  Musculoskeletal:     Right knee: Bony tenderness present. No swelling, deformity or effusion. Tenderness present over the medial joint line and lateral joint line. No LCL laxity, MCL laxity or ACL laxity.     Right lower leg: No edema.     Left lower leg: No edema.  Neurological:     Mental Status: She is alert.      No results found for any visits on 01/16/22.  Last CBC Lab Results  Component Value Date   WBC 6.3 09/11/2021   HGB 13.1 09/11/2021   HCT 40.8 09/11/2021   MCV 92.3 09/11/2021   MCH 29.6 09/11/2021   RDW 15.3 09/11/2021   PLT 264 09/11/2021   Last hemoglobin A1c Lab Results  Component Value Date   HGBA1C 6.1 (H) 09/30/2018   Last thyroid functions Lab Results  Component Value Date   TSH 0.647 02/21/2021   T3TOTAL 95 02/21/2021   Last vitamin D Lab Results  Component Value Date   VD25OH 22.8 (L) 02/25/2018      The ASCVD Risk score (Arnett  DK, et al., 2019) failed to calculate for the following reasons:   The patient has a prior MI or stroke diagnosis    Assessment & Plan:   Problem List Items Addressed This Visit       Cardiovascular and Mediastinum   Essential hypertension, benign - Primary (Chronic)   Relevant Orders   Magnesium   BMP8+Anion Gap     Nervous and Auditory   Sciatica of left side    Encouraged follow up with Dr Davy Pique I think she would likely benefit from a steroid injection to the left L4-5 space        Musculoskeletal and Integument   Primary osteoarthritis of right knee (Chronic)    Performed steroid injection to the right knee today.      Relevant Medications   HYDROcodone-acetaminophen (NORCO) 10-325 MG tablet (Start on 01/25/2022)   HYDROcodone-acetaminophen (NORCO) 10-325 MG tablet (Start on 02/24/2022)   HYDROcodone-acetaminophen (NORCO) 10-325 MG tablet (Start  on 03/26/2022)     Other   Morbid obesity with BMI of 45.0-49.9, adult ([place]) (Chronic)    Discussed weight trending up.      Chronic pain (Chronic)    We discussed increasing her quantity of Norco to 120 tablets/month which would allow for 4 times a day dosing.  She has not had any red flag behavior.  She has had multiple chronic pain generators including her low back with left-sided sciatica she is following with Dr. Davy Pique now and I think that she would likely benefit from a steroid injection to her spine to help alleviate that.  She also has osteoarthritis of her knees right worse than left she has been benefiting from intermittent steroid injections to that area.  I have discussed again with her that I think weight is a major issue her weight has gone up now that she is off Ozempic due to concerns that it may have contributed to her breast cancer I reassured her that this does not appear to be the case.      Relevant Medications   HYDROcodone-acetaminophen (NORCO) 10-325 MG tablet (Start on 01/25/2022)   HYDROcodone-acetaminophen (NORCO) 10-325 MG tablet (Start on 02/24/2022)   HYDROcodone-acetaminophen (NORCO) 10-325 MG tablet (Start on 03/26/2022)   Vitamin D deficiency   Relevant Orders   Vitamin D (25 hydroxy)   Other Visit Diagnoses     Pain of left foot       Relevant Orders   Iron, TIBC and Ferritin Panel   Magnesium       Return in about 3 months (around 04/18/2022).    Breanna Groves, DO  PROCEDURE NOTE  PROCEDURE: right knee joint steroid injection.  PREOPERATIVE DIAGNOSIS: Osteoarthritis of the right knee.  POSTOPERATIVE DIAGNOSIS: Osteoarthritis of the right knee.  PROCEDURE: The patient was apprised of the risks and the benefits of the procedure and informed consent was obtained, as witnessed by [facility]. Time-out procedure was performed, with confirmation of the patient's name, date of birth, and correct identification of the right knee to be injected. The patient's  knee was then marked at the appropriate site for injection placement. The knee was sterilely prepped with Betadine. A 40 mg (1 milliliter) solution of Kenalog was drawn up into a 5 mL syringe with a 2 mL of 1% lidocaine. The patient was injected with a 25-gauge needle at the anterior lateral aspect of her right flexed knee. There were no complications. The patient tolerated the procedure well. There was minimal bleeding. The patient was instructed  to ice her knee upon leaving clinic and refrain from overuse over the next 3 days. The patient was instructed to go to the emergency room with any usual pain, swelling, or redness occurred in the injected area. The patient was given a followup appointment to evaluate response to the injection to his increased range of motion and reduction of pain.

## 2022-01-16 NOTE — Assessment & Plan Note (Signed)
We discussed increasing her quantity of Norco to 120 tablets/month which would allow for 4 times a day dosing.  She has not had any red flag behavior.  She has had multiple chronic pain generators including her low back with left-sided sciatica she is following with Dr. Davy Pique now and I think that she would likely benefit from a steroid injection to her spine to help alleviate that.  She also has osteoarthritis of her knees right worse than left she has been benefiting from intermittent steroid injections to that area.  I have discussed again with her that I think weight is a major issue her weight has gone up now that she is off Ozempic due to concerns that it may have contributed to her breast cancer I reassured her that this does not appear to be the case.

## 2022-01-16 NOTE — Assessment & Plan Note (Signed)
Discussed weight trending up.

## 2022-01-16 NOTE — Assessment & Plan Note (Signed)
Encouraged follow up with Dr Davy Pique I think she would likely benefit from a steroid injection to the left L4-5 space

## 2022-01-16 NOTE — Patient Instructions (Addendum)
I want you to give Dr Dionne Ano office a call at [phone] to schedule your follow up to discuss MRI

## 2022-01-16 NOTE — Assessment & Plan Note (Signed)
Performed steroid injection to the right knee today.

## 2022-01-17 LAB — BMP8+ANION GAP
Anion Gap: 14 mmol/L (ref 10.0–18.0)
BUN/Creatinine Ratio: 26 (ref 12–28)
BUN: 18 mg/dL (ref 8–27)
CO2: 25 mmol/L (ref 20–29)
Calcium: 10.2 mg/dL (ref 8.7–10.3)
Chloride: 99 mmol/L (ref 96–106)
Creatinine, Ser: 0.69 mg/dL (ref 0.57–1.00)
Glucose: 98 mg/dL (ref 70–99)
Potassium: 4.6 mmol/L (ref 3.5–5.2)
Sodium: 138 mmol/L (ref 134–144)
eGFR: 98 mL/min/{1.73_m2} (ref >59)

## 2022-01-17 LAB — IRON,TIBC AND FERRITIN PANEL
Ferritin: 67 ng/mL (ref 15–150)
Iron Saturation: 18 % (ref 15–55)
Iron: 62 ug/dL (ref 27–139)
Total Iron Binding Capacity: 350 ug/dL (ref 250–450)
UIBC: 288 ug/dL (ref 118–369)

## 2022-01-17 LAB — MAGNESIUM: Magnesium: 2.3 mg/dL (ref 1.6–2.3)

## 2022-01-17 LAB — VITAMIN D 25 HYDROXY (VIT D DEFICIENCY, FRACTURES): Vit D, 25-Hydroxy: 26.3 ng/mL — ABNORMAL LOW (ref 30.0–100.0)

## 2022-01-22 ENCOUNTER — Encounter: Payer: Self-pay | Admitting: *Deleted

## 2022-01-24 ENCOUNTER — Telehealth: Payer: Self-pay | Admitting: Hematology and Oncology

## 2022-01-24 NOTE — Telephone Encounter (Signed)
.  Called patient to schedule appointment per 7/7 inbasket, patient is aware of date and time.   

## 2022-01-27 ENCOUNTER — Inpatient Hospital Stay: Payer: Medicare Other | Admitting: Hematology and Oncology

## 2022-01-27 ENCOUNTER — Encounter: Payer: Self-pay | Admitting: *Deleted

## 2022-01-30 ENCOUNTER — Ambulatory Visit: Payer: Medicare Other | Admitting: Hematology and Oncology

## 2022-02-03 ENCOUNTER — Other Ambulatory Visit: Payer: Self-pay | Admitting: *Deleted

## 2022-02-03 NOTE — Patient Outreach (Signed)
[facility]) Care Management  02/03/2022  Breanna Wilkins Jun 13, 1961 [id number]  [phone] RN Health Coach telephone call to patient.  [employer] compliance verified. Patient blood pressure is still having some elevation episodes. RN Health Coach will close out following patient Plan: Refer to RN Care Coordinator to continue follow up care  [facility] [phone]

## 2022-02-13 ENCOUNTER — Other Ambulatory Visit: Payer: Self-pay

## 2022-02-13 ENCOUNTER — Encounter: Payer: Self-pay | Admitting: Hematology and Oncology

## 2022-02-13 ENCOUNTER — Inpatient Hospital Stay: Payer: Medicare Other | Attending: Hematology and Oncology | Admitting: Hematology and Oncology

## 2022-02-13 ENCOUNTER — Encounter: Payer: Self-pay | Admitting: *Deleted

## 2022-02-13 DIAGNOSIS — Z79811 Long term (current) use of aromatase inhibitors: Secondary | ICD-10-CM | POA: Insufficient documentation

## 2022-02-13 DIAGNOSIS — Z923 Personal history of irradiation: Secondary | ICD-10-CM | POA: Insufficient documentation

## 2022-02-13 DIAGNOSIS — Z170 Estrogen receptor positive status [ER+]: Secondary | ICD-10-CM | POA: Diagnosis not present

## 2022-02-13 DIAGNOSIS — R21 Rash and other nonspecific skin eruption: Secondary | ICD-10-CM | POA: Diagnosis not present

## 2022-02-13 DIAGNOSIS — C50412 Malignant neoplasm of upper-outer quadrant of left female breast: Secondary | ICD-10-CM | POA: Diagnosis present

## 2022-02-13 MED ORDER — NYSTATIN 100000 UNIT/GM EX POWD: 1.0000 | Freq: Three times a day (TID) | CUTANEOUS | 1 refills | Status: DC

## 2022-02-13 MED ORDER — ANASTROZOLE 1 MG PO TABS: 1.0000 mg | ORAL_TABLET | Freq: Every day | ORAL | 3 refills | Status: DC

## 2022-02-13 NOTE — Assessment & Plan Note (Addendum)
This is a very pleasant 61 year old female patient with T1 N0 M0 grade 1-2, ER/PR positive, HER2 negative, KI less than 5% invasive ductal carcinoma referred to breast [facility] for additional recommendations.  She had Oncotype (node positive category, patient had isolated tumor cells which showed a recurrence score of 22, distant recurrence risk at 9 years at 18%, chemotherapy benefit cannot be excluded. Given small benefit of chemotherapy, she was agreeable to move forward with radiation alone.  She is now here for follow-up after adjuvant radiation.  Given postmenopausal status we have discussed about both tamoxifen as well as aromatase inhibitors.  We have discussed about mechanism of action of each class, adverse effects from each class.  Questionable increased risk of cardiovascular events with tamoxifen.  With aromatase inhibitors we have discussed about, arthralgias, bone loss and questionable increased risk of cardiovascular events.  She elected to proceed with anastrozole.  This has been dispensed to the pharmacy of her choice. Mammogram ordered for January 2024.  She is also due for a bone density which has been ordered. With regards to the rash under the breast, sent her some nystatin powder for relief. Return to clinic in 3 months with survivorship clinic. FU with me in 6 months

## 2022-02-13 NOTE — Progress Notes (Signed)
[facility] CONSULT NOTE  Patient Care Team: Lucious Groves, DO as PCP - General (Internal Medicine) Belva Crome, MD as PCP - Cardiology (Cardiology) [name], MD as Consulting Physician (General Surgery) Benay Pike, MD as Consulting Physician (Hematology and Oncology) Kyung Rudd, MD as Consulting Physician (Radiation Oncology) Rockwell Germany, RN as Oncology Nurse Navigator Mauro Kaufmann, RN as Oncology Nurse Navigator  CHIEF COMPLAINTS/PURPOSE OF CONSULTATION:  Newly diagnosed breast cancer  HISTORY OF PRESENTING ILLNESS:  Breanna Wilkins 61 y.o. female is here because of recent diagnosis of right breast IDC  07/18/2021 screening mammogram showed possible mass in the left breast seen in the upper left breast, no suspicious findings in the right breast.  08/15/2021, unilateral left diagnostic mammogram showed left breast mass measuring 13 x 12 x 13 mm, irregular hypoechoic mass 10 cm from the nipple, no left axillary adenopathy.  Left breast biopsy showed invasive mammary carcinoma grade 1 through 2 of 3 measuring 1.9 cm in greatest linear extent prognostics show ER 100% positive, moderate staining intensity, PR 100% positive, strong staining intensity, KI of less than 5%.  E-cadherin is positive supporting a ductal origin.  She had Oncotype (node positive category, patient had isolated tumor cells which showed a recurrence score of 22, distant recurrence risk at 9 years at 18%, chemotherapy benefit cannot be excluded. Given small benefit of chemotherapy, she was agreeable to move forward with radiation alone.  She is here for follow-up after adjuvant radiation. 11/26/2021 - 12/25/2021 Radiation  Radiation Therapy Treatment Details (Noted on 11/12/2021) Sites: Left Breast, Left Breast - Upper outer quadrant of breast Technique: 3D CRT Goal: Curative Left Breast 4005 cGy in 267 cGy dialy fractions followed by a tumor bed boost of 1000 cGy in 200 cGy daily  fractions from 11/26/2021 to 12/25/2021  I reviewed her records extensively and collaborated the history with the patient.  SUMMARY OF ONCOLOGIC HISTORY: Oncology History  Malignant neoplasm of upper-outer quadrant of left breast in female, estrogen receptor positive ([place])  09/10/2021 Initial Diagnosis   Malignant neoplasm of upper-outer quadrant of left breast in female, estrogen receptor positive ([place])    Genetic Testing   [facility] CancerNext-Expanded panel was Negative. Of note, a variant of uncertain significance was detected in the BRCA1 gene (p.[id number]). Report date is 09/30/2021.   The CancerNext-Expanded gene panel offered by [facility] and includes sequencing, rearrangement, and RNA analysis for the following 77 genes: AIP, ALK, APC, ATM, AXIN2, BAP1, BARD1, BLM, BMPR1A, BRCA1, BRCA2, BRIP1, CDC73, CDH1, CDK4, CDKN1B, CDKN2A, CHEK2, CTNNA1, DICER1, FANCC, FH, FLCN, GALNT12, KIF1B, LZTR1, MAX, MEN1, MET, MLH1, MSH2, MSH3, MSH6, MUTYH, NBN, NF1, NF2, NTHL1, PALB2, PHOX2B, PMS2, POT1, PRKAR1A, PTCH1, PTEN, RAD51C, RAD51D, RB1, RECQL, RET, SDHA, SDHAF2, SDHB, SDHC, SDHD, SMAD4, SMARCA4, SMARCB1, SMARCE1, STK11, SUFU, TMEM127, TP53, TSC1, TSC2, VHL and XRCC2 (sequencing and deletion/duplication); EGFR, EGLN1, HOXB13, KIT, MITF, PDGFRA, POLD1, and POLE (sequencing only); EPCAM and GREM1 (deletion/duplication only).     Since last visit, she continues to heal from side effects of radiation.  She has a small area of dehiscence in the left upper chest with some pale granulation tissue, no purulence.  She has recently seen Dr. Lynnette Caffey from radiation oncology.  She is willing to go ahead and get started with antiestrogen therapy.  Other than postradiation changes, she denies any new complaints.  She has some baseline arthralgias.  Rest of the pertinent 10 point ROS reviewed and negative  MEDICAL HISTORY:  Past Medical  History:  Diagnosis Date   Abdominal hernia    Arthritis    Breast cancer ([place])     CAD (coronary artery disease)    Perioperative non-STEMI March, 2014  //   cardiac catheterization at time of non-STEMI, March, 2014, minor nonobstructive coronary disease, vigorous LV function, possibly vasospastic event versus stress cardiomyopathy. No further workup of coronary disease   CHF (congestive heart failure) ([place])    Diverticulosis of colon 10/17/2017   Noted on colonoscopy 10/14/17   Ejection fraction    65-70%, vigorous function, echo, March, 2011   Ejection fraction    Vigorous function at time of catheterization October 06, 2012,   GERD (gastroesophageal reflux disease)    History of colonic polyps 10/17/2017   Colonoscopy 10/14/17   Hyperlipidemia    Hypertension    Internal hemorrhoids without complication 74/02/1447   Noted on colonoscopy 10/14/17   Lung nodule 12/2009   Very small, left upper lobe by CT June, 2011, one-year followup was stable   Myocardial infarction ([place]) 09/2013   OSA (obstructive sleep apnea)    CPAP   Overweight(278.02)    Pulmonary embolus ([place]) 01/16/2010   on Coumadin indefinitely   Shortness of breath    Shoulder pain    Sleep apnea    Warfarin anticoagulation     SURGICAL HISTORY: Past Surgical History:  Procedure Laterality Date   ABDOMINAL HYSTERECTOMY  07/2002   laparoscopic assisted vaginal hysterectomy for menorrhagia, dysmenorrhea, anemia, fibroids   BREAST BIOPSY Left 09/03/2021   BREAST LUMPECTOMY WITH RADIOACTIVE SEED AND SENTINEL LYMPH NODE BIOPSY Left 10/03/2021   Procedure: LEFT BREAST LUMPECTOMY WITH RADIOACTIVE SEED AND SENTINEL LYMPH NODE BIOPSY;  Surgeon: [name], MD;  Location: [facility];  Service: General;  Laterality: Left;   COLOSTOMY     INCISIONAL HERNIA REPAIR N/A 09/27/2012   Procedure: LAPAROSCOPIC INCISIONAL HERNIA possible open;  Surgeon: Adin Hector, MD;  Location: [facility];  Service: General;  Laterality: N/A;   INSERTION OF MESH N/A 09/27/2012   Procedure: INSERTION OF MESH;  Surgeon: Adin Hector, MD;  Location: [facility];  Service: General;  Laterality: N/A;   KNEE ARTHROSCOPY Left    LEFT HEART CATHETERIZATION WITH CORONARY ANGIOGRAM N/A 10/06/2012   Procedure: LEFT HEART CATHETERIZATION WITH CORONARY ANGIOGRAM;  Surgeon: Sherren Mocha, MD;  Location: [facility] CATH LAB;  Service: Cardiovascular;  Laterality: N/A;    SOCIAL HISTORY: Social History   Socioeconomic History   Marital status: Single    Spouse name: Not on file   Number of children: 0   Years of education: 12   Highest education level: 10th grade  Occupational History   Occupation: Disabled  Tobacco Use   Smoking status: Former    Types: Cigarettes    Quit date: 05/03/1988    Years since quitting: 33.8    Passive exposure: Never   Smokeless tobacco: Never  Vaping Use   Vaping Use: Never used  Substance and Sexual Activity   Alcohol use: Yes    Alcohol/week: 1.0 standard drink of alcohol    Types: 1 Cans of beer per week    Comment: Wine sometimes.   Drug use: Never   Sexual activity: Not Currently  Other Topics Concern   Not on file  Social History Narrative   Financial assistance approved for 100% discount at [facility] and has [facility] card per [place]6/01/2010      Current Social History 02/11/2021        Patient lives with  sister in a home  that is 1 story. There are 4 steps up to the entrance the patient uses. There is a railing.       Patient's method of transportation is personal car.      The highest level of education was some high school.      The patient currently is employed as aid on school bus.      Identified important Relationships are "my sister"       Pets : 0       Interests / Fun: "I like to fish and I love my job"       Current Stressors: "none"       Religious / Personal Beliefs: "I don't know"        Social Determinants of Health   Financial Resource Strain: Low Risk  (02/10/2019)   Overall Financial Resource Strain (CARDIA)    Difficulty of Paying Living Expenses: Not  hard at all  Food Insecurity: No Food Insecurity (10/09/2021)   Hunger Vital Sign    Worried About Running Out of Food in the Last Year: Never true    [place] in the Last Year: Never true  Transportation Needs: No Transportation Needs (10/09/2021)   PRAPARE - Hydrologist (Medical): No    Lack of Transportation (Non-Medical): No  Physical Activity: Unknown (02/10/2019)   Exercise Vital Sign    Days of Exercise per Week: Patient refused    Minutes of Exercise per Session: Patient refused  Stress: No Stress Concern Present (01/16/2021)   [place]    Feeling of Stress : Not at all  Social Connections: Unknown (02/10/2019)   Social Connection and Isolation Panel [NHANES]    Frequency of Communication with Friends and Family: More than three times a week    Frequency of Social Gatherings with Friends and Family: Three times a week    Attends Religious Services: Never    Active Member of Clubs or Organizations: No    Attends Archivist Meetings: Never    Marital Status: Patient refused  Intimate Partner Violence: Not At Risk (07/03/2021)   Humiliation, Afraid, Rape, and Kick questionnaire    Fear of Current or Ex-Partner: No    Emotionally Abused: No    Physically Abused: No    Sexually Abused: No    FAMILY HISTORY: Family History  Problem Relation Age of Onset   Diabetes Mother    Ovarian cancer Mother 58   Diabetes Father    Diabetes Sister    Colon polyps Sister    Brain cancer Maternal Aunt        unsure if it was a brain primary or cancer that metastasized to the brain   Cancer Paternal Uncle        unknown type   Bone cancer Maternal Grandmother        unsure if it was a bone primary or cancer that metastasized to the bones   Breast cancer Neg Hx    Colon cancer Neg Hx    Esophageal cancer Neg Hx    Rectal cancer Neg Hx    Stomach cancer Neg Hx      ALLERGIES:  has No Known Allergies.  MEDICATIONS:  Current Outpatient Medications  Medication Sig Dispense Refill   anastrozole (ARIMIDEX) 1 MG tablet Take 1 tablet (1 mg total) by mouth daily. 90 tablet 3   nystatin (MYCOSTATIN/NYSTOP) powder  Apply 1 Application topically 3 (three) times daily. 30 g 1   aspirin 81 MG chewable tablet Chew 1 tablet (81 mg total) by mouth daily. 30 tablet 1   carvedilol (COREG) 6.25 MG tablet Take 1 tablet (6.25 mg total) by mouth 2 (two) times daily with a meal. 180 tablet 3   Cholecalciferol (VITAMIN D) 50 MCG (2000 UT) CAPS Take 2,000 Units by mouth daily.     cyclobenzaprine (FEXMID) 7.5 MG tablet Take 1 tablet (7.5 mg total) by mouth 3 (three) times daily as needed for muscle spasms. 30 tablet 1   diclofenac Sodium (VOLTAREN) 1 % GEL Apply 4g to knee four times a day as needed for pain, apply 2g to shoulder as needed for pain (Patient not taking: Reported on 09/24/2021) 350 g 3   furosemide (LASIX) 20 MG tablet Take 1 tablet (20 mg total) by mouth daily. 90 tablet 1   HYDROcodone-acetaminophen (NORCO) 10-325 MG tablet Take 1 tablet by mouth every 6 (six) hours as needed for severe pain. 120 tablet 0   [START ON 02/24/2022] HYDROcodone-acetaminophen (NORCO) 10-325 MG tablet Take 1 tablet by mouth every 6 (six) hours as needed for severe pain. 120 tablet 0   [START ON 03/26/2022] HYDROcodone-acetaminophen (NORCO) 10-325 MG tablet Take 1 tablet by mouth every 6 (six) hours as needed for severe pain. 120 tablet 0   Insulin Pen Needle (NOVOFINE) 32G X 6 MM MISC 1 Units by Does not apply route daily. 100 each 1   lidocaine (LIDODERM) 5 % Place 1 patch onto the skin daily. Apply patch to right hip as needed 15 patch 0   lidocaine (XYLOCAINE) 5 % ointment Apply 1 application topically 2 (two) times daily as needed. (Patient not taking: Reported on 09/24/2021) 150 g 1   olmesartan (BENICAR) 40 MG tablet Take 1 tablet (40 mg total) by mouth daily. 90 tablet 3    polyethylene glycol (MIRALAX / GLYCOLAX) packet Take 17 g by mouth daily.     pregabalin (LYRICA) 50 MG capsule Take 1 capsule (50 mg total) by mouth 3 (three) times daily. 90 capsule 2   rivaroxaban (XARELTO) 20 MG TABS tablet Take 1 tablet (20 mg total) by mouth daily with supper. 90 tablet 3   rosuvastatin (CRESTOR) 5 MG tablet Take 1 tablet (5 mg total) by mouth at bedtime. 90 tablet 3   No current facility-administered medications for this visit.    REVIEW OF SYSTEMS:   Constitutional: Denies fevers, chills or abnormal night sweats Eyes: Denies blurriness of vision, double vision or watery eyes Ears, nose, mouth, throat, and face: Denies mucositis or sore throat Respiratory: Denies cough, dyspnea or wheezes Cardiovascular: Denies palpitation, chest discomfort or lower extremity swelling Gastrointestinal:  Denies nausea, heartburn or change in bowel habits Skin: Denies abnormal skin rashes Lymphatics: Denies new lymphadenopathy or easy bruising Neurological:Denies numbness, tingling or new weaknesses Behavioral/Psych: Mood is stable, no new changes  Breast: Palpable left breast lump-ecchymosis reported. All other systems were reviewed with the patient and are negative.  PHYSICAL EXAMINATION: ECOG PERFORMANCE STATUS: 0 - Asymptomatic  Vitals:   02/13/22 1201  BP: 133/62  Pulse: 72  Resp: 17  Temp: 97.9 F (36.6 C)  SpO2: 99%    Filed Weights   02/13/22 1201  Weight: 287 lb 9.6 oz (130.5 kg)     GENERAL:alert, no distress and comfortable, obese Breast: Bilateral large pendulous breast.  Left breast with some postradiation changes and skin excoriation.  No evidence of infection.  LABORATORY DATA:  I have reviewed the data as listed Lab Results  Component Value Date   WBC 6.3 09/11/2021   HGB 13.1 09/11/2021   HCT 40.8 09/11/2021   MCV 92.3 09/11/2021   PLT 264 09/11/2021   Lab Results  Component Value Date   NA 138 01/16/2022   K 4.6 01/16/2022   CL 99  01/16/2022   CO2 25 01/16/2022    RADIOGRAPHIC STUDIES: I have personally reviewed the radiological reports and agreed with the findings in the report.  ASSESSMENT AND PLAN:  Malignant neoplasm of upper-outer quadrant of left breast in female, estrogen receptor positive ([place]) This is a very pleasant 61 year old female patient with T1 N0 M0 grade 1-2, ER/PR positive, HER2 negative, KI less than 5% invasive ductal carcinoma referred to breast [place] for additional recommendations.  She had Oncotype (node positive category, patient had isolated tumor cells which showed a recurrence score of 22, distant recurrence risk at 9 years at 18%, chemotherapy benefit cannot be excluded. Given small benefit of chemotherapy, she was agreeable to move forward with radiation alone.  She is now here for follow-up after adjuvant radiation.  Given postmenopausal status we have discussed about both tamoxifen as well as aromatase inhibitors.  We have discussed about mechanism of action of each class, adverse effects from each class.  Questionable increased risk of cardiovascular events with tamoxifen.  With aromatase inhibitors we have discussed about, arthralgias, bone loss and questionable increased risk of cardiovascular events.  She elected to proceed with anastrozole.  This has been dispensed to the pharmacy of her choice. Mammogram ordered for January 2024.  She is also due for a bone density which has been ordered. With regards to the rash under the breast, sent her some nystatin powder for relief. Return to clinic in 3 months with survivorship clinic. FU with me in 6 months  Total time spent: 30 minutes including history, physical exam, review of records, counseling and coordination of care All questions were answered. The patient knows to call the clinic with any problems, questions or concerns.    Benay Pike, MD 02/13/22

## 2022-03-17 ENCOUNTER — Ambulatory Visit: Payer: Medicare Other | Attending: General Surgery

## 2022-03-17 VITALS — Wt 292.4 lb

## 2022-03-17 DIAGNOSIS — Z483 Aftercare following surgery for neoplasm: Secondary | ICD-10-CM | POA: Insufficient documentation

## 2022-03-17 NOTE — Therapy (Signed)
OUTPATIENT PHYSICAL THERAPY [facility] SCREENING NOTE   Patient Name: Breanna Wilkins MRN: [id number] DOB:[birth year], 61 y.o., female Today's Date: 03/17/2022  PCP: Lucious Groves, DO REFERRING PROVIDER: [name], MD   PT End of Session - 03/17/22 1017     Visit Number 1   # unchanged due to screen only   PT Start Time 9381    PT Stop Time 1020    PT Time Calculation (min) 5 min    Activity Tolerance Patient tolerated treatment well    Behavior During Therapy [facility] for tasks assessed/performed             Past Medical History:  Diagnosis Date   Abdominal hernia    Arthritis    Breast cancer ([place])    CAD (coronary artery disease)    Perioperative non-STEMI March, 2014  //   cardiac catheterization at time of non-STEMI, March, 2014, minor nonobstructive coronary disease, vigorous LV function, possibly vasospastic event versus stress cardiomyopathy. No further workup of coronary disease   CHF (congestive heart failure) ([place])    Diverticulosis of colon 10/17/2017   Noted on colonoscopy 10/14/17   Ejection fraction    65-70%, vigorous function, echo, March, 2011   Ejection fraction    Vigorous function at time of catheterization October 06, 2012,   GERD (gastroesophageal reflux disease)    History of colonic polyps 10/17/2017   Colonoscopy 10/14/17   Hyperlipidemia    Hypertension    Internal hemorrhoids without complication 01/75/1025   Noted on colonoscopy 10/14/17   Lung nodule 12/2009   Very small, left upper lobe by CT June, 2011, one-year followup was stable   Myocardial infarction ([place]) 09/2013   OSA (obstructive sleep apnea)    CPAP   Overweight(278.02)    Pulmonary embolus ([place]) 01/16/2010   on Coumadin indefinitely   Shortness of breath    Shoulder pain    Sleep apnea    Warfarin anticoagulation    Past Surgical History:  Procedure Laterality Date   ABDOMINAL HYSTERECTOMY  07/2002   laparoscopic assisted vaginal hysterectomy for menorrhagia, dysmenorrhea,  anemia, fibroids   BREAST BIOPSY Left 09/03/2021   BREAST LUMPECTOMY WITH RADIOACTIVE SEED AND SENTINEL LYMPH NODE BIOPSY Left 10/03/2021   Procedure: LEFT BREAST LUMPECTOMY WITH RADIOACTIVE SEED AND SENTINEL LYMPH NODE BIOPSY;  Surgeon: [name], MD;  Location: [facility];  Service: General;  Laterality: Left;   COLOSTOMY     INCISIONAL HERNIA REPAIR N/A 09/27/2012   Procedure: LAPAROSCOPIC INCISIONAL HERNIA possible open;  Surgeon: Adin Hector, MD;  Location: [facility];  Service: General;  Laterality: N/A;   INSERTION OF MESH N/A 09/27/2012   Procedure: INSERTION OF MESH;  Surgeon: Adin Hector, MD;  Location: [facility];  Service: General;  Laterality: N/A;   KNEE ARTHROSCOPY Left    LEFT HEART CATHETERIZATION WITH CORONARY ANGIOGRAM N/A 10/06/2012   Procedure: LEFT HEART CATHETERIZATION WITH CORONARY ANGIOGRAM;  Surgeon: Sherren Mocha, MD;  Location: [facility] CATH LAB;  Service: Cardiovascular;  Laterality: N/A;   Patient Active Problem List   Diagnosis Date Noted   Preop examination 10/02/2021   Genetic testing 09/27/2021   Family history of ovarian cancer 09/12/2021   Malignant neoplasm of upper-outer quadrant of left breast in female, estrogen receptor positive ([place]) 09/10/2021   Neck mass 02/22/2021   Musculoskeletal neck pain 05/22/2020   Leg pain, posterior, left 12/21/2019   Strain of rectus abdominis muscle 09/06/2019   Sciatica of left side 11/10/2018   Meralgia paraesthetica  10/20/2018   Chronic fatigue 10/01/2018   Diverticulosis of colon 10/17/2017   Internal hemorrhoids without complication 22/97/9892   History of colonic polyps 10/17/2017   BRBPR (bright red blood per rectum) 08/25/2017   Chronic pain 07/10/2017   Long term (current) use of opiate analgesic 09/17/2016   Vitamin D deficiency 09/17/2016   Primary osteoarthritis of right knee 03/04/2016   Nipple dermatitis 11/20/2015   Prediabetes 06/07/2015   Morbid obesity with BMI of 45.0-49.9, adult ([place]) 06/07/2015    GERD (gastroesophageal reflux disease) 05/29/2014   Atypical chest pain 02/23/2014   Plantar fasciitis of right foot 02/07/2014   Carpal tunnel syndrome of right wrist 12/05/2013   Pain of left calf 11/03/2013   Preventative health care 05/12/2013   CHF NYHA class II (symptoms with moderately strenuous activities) ([place]) 10/11/2012   Hyperlipidemia 08/31/2012   History of DVT (deep vein thrombosis) 03/20/2011   Long term (current) use of anticoagulants 08/09/2010   Supraspinatus tendon tear 02/11/2010   History of pulmonary embolism 01/16/2010   Severe obstructive sleep apnea 06/29/2009   Essential hypertension, benign 06/04/2009    REFERRING DIAG: left breast cancer at risk for lymphedema  THERAPY DIAG: Aftercare following surgery for neoplasm  PERTINENT HISTORY: Patient was diagnosed on 07/18/2022 with left grade I-II invasive ductal carcinoma breast cancer. She underwent a left lumpectomy and sentinel node biopsy (2 nodes - 1 negative and 1 with isolated tumor cells) on 10/03/2021. It is ER/PR positive and HER2 negative with a Ki67 of < 5%.     PRECAUTIONS: left UE Lymphedema risk  SUBJECTIVE: Pt returns for her 3 month L-Dex screen.   PAIN:  Are you having pain? Yes - bilateral leg pain which is chronic  SOZO SCREENING: Patient was assessed today using the SOZO machine to determine the lymphedema index score. This was compared to her baseline score. It was determined that she is within the recommended range when compared to her baseline and no further action is needed at this time. She will continue SOZO screenings. These are done every 3 months for 2 years post operatively followed by every 6 months for 2 years, and then annually.   L-DEX FLOWSHEETS - 03/17/22 1000       L-DEX LYMPHEDEMA SCREENING   Measurement Type Unilateral    L-DEX MEASUREMENT EXTREMITY Upper Extremity    POSITION  Standing    DOMINANT SIDE Right    At Risk Side Left    BASELINE SCORE (UNILATERAL)  6    L-DEX SCORE (UNILATERAL) 7.5    VALUE CHANGE (UNILAT) 1.5             Collie Siad, PTA 03/17/22 10:19 AM

## 2022-03-27 ENCOUNTER — Ambulatory Visit: Payer: Medicare Other | Admitting: *Deleted

## 2022-03-31 ENCOUNTER — Encounter: Payer: Self-pay | Admitting: *Deleted

## 2022-04-01 ENCOUNTER — Encounter: Payer: Self-pay | Admitting: Internal Medicine

## 2022-04-01 NOTE — Progress Notes (Signed)
Letter written may be accessed by mychart.

## 2022-04-04 ENCOUNTER — Other Ambulatory Visit: Payer: Self-pay | Admitting: *Deleted

## 2022-04-04 NOTE — Telephone Encounter (Signed)
Patient called in requesting refill on Voltaren gel.  Also, wants to discuss receiving knee injection and Vit B12 injection at upcoming [place] with PCP on 10/19.

## 2022-04-07 MED ORDER — DICLOFENAC SODIUM 1 % EX GEL: CUTANEOUS | 3 refills | Status: DC

## 2022-04-10 ENCOUNTER — Telehealth: Payer: Self-pay | Admitting: *Deleted

## 2022-04-10 NOTE — Telephone Encounter (Signed)
Pt had called to say she was unable to get Voltaren gel. I called [facility] who stated the med was out-of-stock on Monday.It is available today. And she stated it is not covered by her insurance- cost $19.32 for 3 tubes. Pt called and informed.

## 2022-04-11 ENCOUNTER — Telehealth: Payer: Self-pay

## 2022-04-11 MED ORDER — DICLOFENAC SODIUM 1 % EX GEL
CUTANEOUS | 3 refills | Status: DC
Start: 2022-04-11 — End: 2022-04-29

## 2022-04-11 NOTE — Telephone Encounter (Signed)
Returned call to patient. Requesting Rx for diclofena gel be sent to [facility] in [place] so she won't have to pay for it. The Rx sent [facility] in [place] will cost her.

## 2022-04-11 NOTE — Telephone Encounter (Signed)
Please call pt back about medication for cream.

## 2022-04-22 ENCOUNTER — Other Ambulatory Visit: Payer: Self-pay

## 2022-04-22 MED ORDER — HYDROCODONE-ACETAMINOPHEN 10-325 MG PO TABS
1.0000 | ORAL_TABLET | Freq: Four times a day (QID) | ORAL | 0 refills | Status: DC | PRN
Start: 2022-04-22 — End: 2022-05-08

## 2022-04-29 ENCOUNTER — Other Ambulatory Visit: Payer: Self-pay

## 2022-04-29 DIAGNOSIS — I509 Heart failure, unspecified: Secondary | ICD-10-CM

## 2022-04-29 MED ORDER — FUROSEMIDE 20 MG PO TABS: 20.0000 mg | ORAL_TABLET | Freq: Every day | ORAL | 1 refills | Status: DC

## 2022-04-29 MED ORDER — DICLOFENAC SODIUM 1 % EX GEL: CUTANEOUS | 3 refills | Status: AC

## 2022-05-01 ENCOUNTER — Encounter: Payer: Medicare Other | Admitting: Internal Medicine

## 2022-05-01 ENCOUNTER — Other Ambulatory Visit: Payer: Self-pay

## 2022-05-04 MED ORDER — ROSUVASTATIN CALCIUM 5 MG PO TABS: 5.0000 mg | ORAL_TABLET | Freq: Every day | ORAL | 3 refills | Status: DC

## 2022-05-08 ENCOUNTER — Encounter: Payer: Self-pay | Admitting: Internal Medicine

## 2022-05-08 ENCOUNTER — Other Ambulatory Visit: Payer: Self-pay

## 2022-05-08 ENCOUNTER — Ambulatory Visit (INDEPENDENT_AMBULATORY_CARE_PROVIDER_SITE_OTHER): Payer: Medicare Other | Admitting: Internal Medicine

## 2022-05-08 VITALS — BP 137/63 | HR 60 | Temp 98.0°F | Ht 67.0 in | Wt 298.7 lb

## 2022-05-08 DIAGNOSIS — Z23 Encounter for immunization: Secondary | ICD-10-CM

## 2022-05-08 DIAGNOSIS — Z0000 Encounter for general adult medical examination without abnormal findings: Secondary | ICD-10-CM

## 2022-05-08 DIAGNOSIS — Z79891 Long term (current) use of opiate analgesic: Secondary | ICD-10-CM | POA: Diagnosis not present

## 2022-05-08 DIAGNOSIS — Z6841 Body Mass Index (BMI) 40.0 and over, adult: Secondary | ICD-10-CM

## 2022-05-08 DIAGNOSIS — I10 Essential (primary) hypertension: Secondary | ICD-10-CM

## 2022-05-08 DIAGNOSIS — G5601 Carpal tunnel syndrome, right upper limb: Secondary | ICD-10-CM | POA: Diagnosis not present

## 2022-05-08 DIAGNOSIS — M5432 Sciatica, left side: Secondary | ICD-10-CM

## 2022-05-08 DIAGNOSIS — M1711 Unilateral primary osteoarthritis, right knee: Secondary | ICD-10-CM

## 2022-05-08 DIAGNOSIS — Z87891 Personal history of nicotine dependence: Secondary | ICD-10-CM

## 2022-05-08 DIAGNOSIS — G8929 Other chronic pain: Secondary | ICD-10-CM

## 2022-05-08 MED ORDER — LIDOCAINE HCL (PF) 1 % IJ SOLN: 2.0000 mL | Freq: Once | INTRAMUSCULAR | Status: DC

## 2022-05-08 MED ORDER — OXYCODONE-ACETAMINOPHEN 10-325 MG PO TABS: 1.0000 | ORAL_TABLET | Freq: Four times a day (QID) | ORAL | 0 refills | Status: DC | PRN

## 2022-05-08 MED ORDER — TRIAMCINOLONE ACETONIDE 40 MG/ML IJ SUSP: 80.0000 mg | Freq: Once | INTRAMUSCULAR | Status: DC

## 2022-05-08 NOTE — Assessment & Plan Note (Signed)
Repeat steroid injection today, will try '80mg'$  dose of kenalog to see if she has additional relief

## 2022-05-08 NOTE — Assessment & Plan Note (Signed)
Recently referred to hand surgery by dr Barry Dienes

## 2022-05-08 NOTE — Assessment & Plan Note (Signed)
Seeing a lot of relief from spinal injections

## 2022-05-08 NOTE — Assessment & Plan Note (Signed)
She was previously losing weight on Ozempic before her breast cancer diagnosis.  She is interested in going back on the medication she reports that she wants to see her oncologist can ask her her opinion if it could have contributed to breast cancer first.

## 2022-05-08 NOTE — Assessment & Plan Note (Signed)
She has chronic pain of her back and right knee as well as occasional shoulder and neck pain.  She has been on hydrocodone for some time at a relatively stable dose.  She is receiving benefit from additional agents including spinal injections and steroid injections to her knee which I provide.  Although her previous hydrocodone dose does not seem to be as effective as previously was I discussed with her the option of rotating to a different opioid as we may be able to use the inherent properties to get an additional benefit in this case we will change from hydrocodone to oxycodone.  I did get a urine toxicology today which I expect to be present for hydrocodone.

## 2022-05-08 NOTE — Progress Notes (Signed)
Annual Wellness Visit     Patient: Breanna Wilkins, Female    DOB: March 17, 1961, 61 y.o.   MRN: [id number]  Subjective  Chief Complaint  Patient presents with   Follow-up    Right knee and back pain.    Breanna Wilkins is a 61 y.o. female who presents today for her Annual Wellness Visit. She reports consuming a general diet. The patient does not participate in regular exercise at present. She generally feels fairly well. She reports sleeping well. She does have additional problems to discuss today.   She has been started on anastrozole by Dr. Chryl Heck.  She had follow-up recently with Dr. Barry Dienes and overall doing well post breast cancer diagnosis.  She continues to have chronic pain she has been seeing Dr. Davy Pique notes that injections in her back have been helpful she continues to be dependent on hydrocodone acetaminophen for additional pain relief especially of her right knee.  She feels that she knows when the steroid injection is running out because she will need to take 2 pills of hydrocodone instead of 1.  Otherwise she does think steroid injections have been helpful for her.  She has a plan for repeat back injection coming up.  Care Team: Cardiology: Dr Tamala Julian GI:  Dr Hilarie Fredrickson Oncology: Dr Chryl Heck Breast Sx: Dr Barry Dienes Pain/NeuroSx: Dr Davy Pique  Vision:Not within last year  and Dental: No current dental problems and Receives regular dental care   Patient Active Problem List   Diagnosis Date Noted   Preop examination 10/02/2021   Genetic testing 09/27/2021   Family history of ovarian cancer 09/12/2021   Malignant neoplasm of upper-outer quadrant of left breast in female, estrogen receptor positive ([place]) 09/10/2021   Neck mass 02/22/2021   Musculoskeletal neck pain 05/22/2020   Leg pain, posterior, left 12/21/2019   Strain of rectus abdominis muscle 09/06/2019   Sciatica of left side 11/10/2018   Meralgia paraesthetica 10/20/2018   Chronic fatigue 10/01/2018   Diverticulosis  of colon 10/17/2017   Internal hemorrhoids without complication 67/06/4579   History of colonic polyps 10/17/2017   BRBPR (bright red blood per rectum) 08/25/2017   Chronic pain 07/10/2017   Long term (current) use of opiate analgesic 09/17/2016   Vitamin D deficiency 09/17/2016   Primary osteoarthritis of right knee 03/04/2016   Nipple dermatitis 11/20/2015   Prediabetes 06/07/2015   Morbid obesity with BMI of 45.0-49.9, adult ([name]) 06/07/2015   GERD (gastroesophageal reflux disease) 05/29/2014   Atypical chest pain 02/23/2014   Plantar fasciitis of right foot 02/07/2014   Carpal tunnel syndrome of right wrist 12/05/2013   Pain of left calf 11/03/2013   Preventative health care 05/12/2013   CHF NYHA class II (symptoms with moderately strenuous activities) ([place]) 10/11/2012   Hyperlipidemia 08/31/2012   History of DVT (deep vein thrombosis) 03/20/2011   Long term (current) use of anticoagulants 08/09/2010   Supraspinatus tendon tear 02/11/2010   History of pulmonary embolism 01/16/2010   Severe obstructive sleep apnea 06/29/2009   Essential hypertension, benign 06/04/2009      Medications: Outpatient Medications Prior to Visit  Medication Sig Note   anastrozole (ARIMIDEX) 1 MG tablet Take 1 tablet (1 mg total) by mouth daily.    aspirin 81 MG chewable tablet Chew 1 tablet (81 mg total) by mouth daily.    carvedilol (COREG) 6.25 MG tablet Take 1 tablet (6.25 mg total) by mouth 2 (two) times daily with a meal.    Cholecalciferol (VITAMIN D) 50 MCG (2000  UT) CAPS Take 2,000 Units by mouth daily.    cyclobenzaprine (FEXMID) 7.5 MG tablet Take 1 tablet (7.5 mg total) by mouth 3 (three) times daily as needed for muscle spasms.    diclofenac Sodium (VOLTAREN) 1 % GEL Apply 4g to knee four times a day as needed for pain, apply 2g to shoulder as needed for pain    furosemide (LASIX) 20 MG tablet Take 1 tablet (20 mg total) by mouth daily.    Insulin Pen Needle (NOVOFINE) 32G X 6 MM MISC 1  Units by Does not apply route daily.    lidocaine (LIDODERM) 5 % Place 1 patch onto the skin daily. Apply patch to right hip as needed    lidocaine (XYLOCAINE) 5 % ointment Apply 1 application topically 2 (two) times daily as needed. (Patient not taking: Reported on 09/24/2021)    nystatin (MYCOSTATIN/NYSTOP) powder Apply 1 Application topically 3 (three) times daily.    olmesartan (BENICAR) 40 MG tablet Take 1 tablet (40 mg total) by mouth daily.    polyethylene glycol (MIRALAX / GLYCOLAX) packet Take 17 g by mouth daily.    rivaroxaban (XARELTO) 20 MG TABS tablet Take 1 tablet (20 mg total) by mouth daily with supper.    rosuvastatin (CRESTOR) 5 MG tablet Take 1 tablet (5 mg total) by mouth at bedtime.    [DISCONTINUED] HYDROcodone-acetaminophen (NORCO) 10-325 MG tablet Take 1 tablet by mouth every 6 (six) hours as needed for severe pain. 05/08/2022: rotation of opioid therapy for additional analgesic benefit   [DISCONTINUED] pregabalin (LYRICA) 50 MG capsule Take 1 capsule (50 mg total) by mouth 3 (three) times daily.    No facility-administered medications prior to visit.    No Known Allergies  Patient Care Team: Lucious Groves, DO as PCP - General (Internal Medicine) Belva Crome, MD as PCP - Cardiology (Cardiology) [name], MD as Consulting Physician (General Surgery) Benay Pike, MD as Consulting Physician (Hematology and Oncology) Kyung Rudd, MD as Consulting Physician (Radiation Oncology) Rockwell Germany, RN as Oncology Nurse Navigator Mauro Kaufmann, RN as Oncology Nurse Navigator  Review of Systems  Constitutional:  Negative for chills and fever.  Respiratory:  Negative for cough.   Cardiovascular:  Negative for chest pain.  Gastrointestinal:  Negative for heartburn.  Musculoskeletal:  Positive for back pain and joint pain. Negative for falls.        Objective  BP 137/63 (BP Location: Right Arm, Patient Position: Sitting, Cuff Size: Large)   Pulse 60    Temp 98 F (36.7 C) (Oral)   Ht 5' 7"  (1.702 m)   Wt 298 lb 11.2 oz (135.5 kg)   SpO2 100% Comment: RA  BMI 46.78 kg/m  BP Readings from Last 3 Encounters:  05/08/22 137/63  02/13/22 133/62  01/16/22 (!) 157/81   Wt Readings from Last 3 Encounters:  05/08/22 298 lb 11.2 oz (135.5 kg)  03/17/22 292 lb 6 oz (132.6 kg)  02/13/22 287 lb 9.6 oz (130.5 kg)      Physical Exam Constitutional:      Appearance: Normal appearance. She is obese.  Cardiovascular:     Rate and Rhythm: Normal rate and regular rhythm.  Pulmonary:     Effort: Pulmonary effort is normal.     Breath sounds: Normal breath sounds.  Abdominal:     General: Abdomen is flat.     Palpations: Abdomen is soft.  Musculoskeletal:     Right knee: Effusion present. No swelling. Tenderness present over  the medial joint line. No lateral joint line tenderness.     Left knee: No swelling or effusion. No tenderness. No medial joint line or lateral joint line tenderness.  Neurological:     Mental Status: She is alert.  Psychiatric:        Mood and Affect: Mood normal.       Most recent functional status assessment:    05/08/2022   10:25 AM  In your present state of health, do you have any difficulty performing the following activities:  Hearing? 0  Vision? 1  Difficulty concentrating or making decisions? 0  Walking or climbing stairs? 1  Dressing or bathing? 0  Doing errands, shopping? 0   Most recent fall risk assessment:    05/08/2022   10:18 AM  Fall Risk   Falls in the past year? 0  Number falls in past yr: 0  Injury with Fall? 0  Risk for fall due to : Impaired mobility  Follow up Falls evaluation completed;Falls prevention discussed    Most recent depression screenings:    05/08/2022   10:20 AM 01/16/2022   10:09 AM  PHQ 2/9 Scores  PHQ - 2 Score 0 0   Most recent cognitive screening:    02/11/2021   10:06 AM  6CIT Screen  What Year? 0 points  What month? 0 points  What time? 0 points   Count back from 20 0 points  Months in reverse 0 points  Repeat phrase 0 points  Total Score 0 points   Most recent Audit-C alcohol use screening    02/10/2019   11:33 AM  Alcohol Use Disorder Test (AUDIT)  Patient refused Alcohol Screening Tool Yes  1. How often do you have a drink containing alcohol? 0  2. How many drinks containing alcohol do you have on a typical day when you are drinking? 0  3. How often do you have six or more drinks on one occasion? 0  AUDIT-C Score 0  Alcohol Brief Interventions/Follow-up Patient Refused   A score of 3 or more in women, and 4 or more in men indicates increased risk for alcohol abuse, EXCEPT if all of the points are from question 1   Vision/Hearing Screen: No results found.  Last CBC Lab Results  Component Value Date   WBC 6.3 09/11/2021   HGB 13.1 09/11/2021   HCT 40.8 09/11/2021   MCV 92.3 09/11/2021   MCH 29.6 09/11/2021   RDW 15.3 09/11/2021   PLT 264 74/06/8785   Last metabolic panel Lab Results  Component Value Date   GLUCOSE 98 01/16/2022   NA 138 01/16/2022   K 4.6 01/16/2022   CL 99 01/16/2022   CO2 25 01/16/2022   BUN 18 01/16/2022   CREATININE 0.69 01/16/2022   EGFR 98 01/16/2022   CALCIUM 10.2 01/16/2022   PHOS 2.9 10/01/2012   PROT 7.6 09/11/2021   ALBUMIN 4.0 09/11/2021   LABGLOB 3.3 02/25/2018   AGRATIO 1.3 02/25/2018   BILITOT 0.4 09/11/2021   ALKPHOS 49 09/11/2021   AST 13 (L) 09/11/2021   ALT 12 09/11/2021   ANIONGAP 5 09/11/2021   Last hemoglobin A1c Lab Results  Component Value Date   HGBA1C 6.1 (H) 09/30/2018   Last thyroid functions Lab Results  Component Value Date   TSH 0.647 02/21/2021   T3TOTAL 95 02/21/2021      No results found for any visits on 05/08/22.    Assessment & Plan   Annual wellness visit done today  including the all of the following: Reviewed patient's Family Medical History Reviewed and updated list of patient's medical providers Assessment of cognitive  impairment was done Assessed patient's functional ability Established a written schedule for health screening [place] Completed and Reviewed  Exercise Activities and Dietary recommendations  Goals      Blood Pressure < 140/90     CARE PLAN ENTRY (see longtitudinal plan of care for additional care plan information)  Objective:  Last practice recorded BP readings:  BP Readings from Last 3 Encounters:  01/26/20 (!) 117/58  12/21/19 129/69  10/20/19 (!) 168/92   Most recent eGFR/CrCl: No results found for: EGFR  No components found for: CRCL  Current Barriers:  Knowledge Deficits related to understanding of medications prescribed for management of hypertension Knowledge deficit related to self care management of hypertension Knowledge deficit related to dangers of uncontrolled hypertension Difficulty obtaining medications Behavior modification  Case Manager Clinical Goal(s):  Over the next 90 days, patient will verbalize understanding of plan for hypertension management Over the next 90 days, patient will attend all scheduled medical appointments: PCP and cardiologist Over the next 90 days, patient will demonstrate improved adherence to prescribed treatment plan for hypertension as evidenced by taking all medications as prescribed, monitoring and recording blood pressure as directed, adhering to low sodium/DASH diet Over the next 90 days, patient will demonstrate improved health management independence as evidenced by checking blood pressure as directed and notifying PCP if SBP>180 or DBP > 110, taking all medications as prescribe, and adhering to a low sodium diet as discussed. Over the next 90 days, patient will verbalize basic understanding of hypertension disease process and self health management plan as evidenced by blood pressure <140/90 and weight loss. Over the next 90 days, patient will utilize portion control Over the next 90 days patient will try to  start an exercise routine  Interventions:  Evaluation of current treatment plan related to hypertension self management and patient's adherence to plan as established by provider. Reviewed medications with patient and discussed importance of compliance Discussed plans with patient for ongoing care management follow up and provided patient with direct contact information for care management team Advised patient, providing education and rationale, to monitor blood pressure daily and record, calling PCP for findings outside established parameters.  Reviewed scheduled/upcoming provider appointments including:  Provided education regarding s/s of stroke and stroke prevention Provided education regarding s/s of heart attack Provided education regarding s/s of DASH diet/Low salt diet Provided education regarding complications of uncontrolled blood pressure Provided education regarding increasing physical activity Provided educational material: Exercise Program Activity Book Provided educational material:  EMMI (Weight Loss Tips, Weight Loss Diet, Chair Exercises, Relaxation Techniques, Low Sodium Diet, DASH Diet, Controlling your Blood Pressure through Lifestyle, High Blood Pressure Health Problems, High Blood Pressure Taking Your Blood Pressure, Lowering Your Rick of High Blood Pressure, Stress, etc)  Patient Self Care Activities:  Self administers medications as prescribed Attends all scheduled provider appointments Calls provider office for new concerns, questions, or BP outside discussed parameters Monitors BP and records as discussed Adheres to a low sodium diet/DASH diet Increase physical activity as tolerated Verbalize where to go to receive medical care Get a medical alert system RN will follow up outreach within the month of October      Exercise 3x per week (30 min per time)     "I would love to be able to walk and start exercising"     LDL CALC <  100     Weight < 308 lb (139.708  kg)        Immunization History  Administered Date(s) Administered   Influenza,inj,Quad PF,6+ Mos 03/24/2019, 06/06/2021, 05/08/2022   PPD Test 09/09/2017   Td 07/22/2003   Tdap 05/29/2014    Health Maintenance  Topic Date Due   COVID-19 Vaccine (1) Never done   Zoster Vaccines- Shingrix (1 of 2) Never done   MAMMOGRAM  07/19/2023   TETANUS/TDAP  05/29/2024   COLONOSCOPY (Pts 45-6yr Insurance coverage will need to be confirmed)  03/14/2026   INFLUENZA VACCINE  Completed   Hepatitis C Screening  Completed   HIV Screening  Completed   HPV VACCINES  Aged Out   PAP SMEAR-Modifier  Discontinued     Discussed health benefits of physical activity, and encouraged her to engage in regular exercise appropriate for her age and condition.    Problem List Items Addressed This Visit       Cardiovascular and Mediastinum   Essential hypertension, benign (Chronic)    Well controlled        Nervous and Auditory   Sciatica of left side    Seeing a lot of relief from spinal injections      Carpal tunnel syndrome of right wrist    Recently referred to hand surgery by dr bBarry Dienes       Musculoskeletal and Integument   Primary osteoarthritis of right knee (Chronic)    Repeat steroid injection today, will try 815mdose of kenalog to see if she has additional relief      Relevant Medications   oxyCODONE-acetaminophen (PERCOCET) 10-325 MG tablet (Start on 05/25/2022)   oxyCODONE-acetaminophen (PERCOCET) 10-325 MG tablet (Start on 06/24/2022)   oxyCODONE-acetaminophen (PERCOCET) 10-325 MG tablet (Start on 07/24/2022)   triamcinolone acetonide (KENALOG-40) injection 80 mg   lidocaine (PF) (XYLOCAINE) 1 % injection 2 mL     Other   Morbid obesity with BMI of 45.0-49.9, [place]    She was previously losing weight on Ozempic before her breast cancer diagnosis.  She is interested in going back on the medication she reports that she wants to see her oncologist can ask her her  opinion if it could have contributed to breast cancer first.      Long term (current) use of opiate analgesic - Primary (Chronic)   Relevant Orders   ToxAssure Select,+Antidepr,UR   Chronic pain (Chronic)    She has chronic pain of her back and right knee as well as occasional shoulder and neck pain.  She has been on hydrocodone for some time at a relatively stable dose.  She is receiving benefit from additional agents including spinal injections and steroid injections to her knee which I provide.  Although her previous hydrocodone dose does not seem to be as effective as previously was I discussed with her the option of rotating to a different opioid as we may be able to use the inherent properties to get an additional benefit in this case we will change from hydrocodone to oxycodone.  I did get a urine toxicology today which I expect to be present for hydrocodone.      Relevant Medications   oxyCODONE-acetaminophen (PERCOCET) 10-325 MG tablet (Start on 05/25/2022)   oxyCODONE-acetaminophen (PERCOCET) 10-325 MG tablet (Start on 06/24/2022)   oxyCODONE-acetaminophen (PERCOCET) 10-325 MG tablet (Start on 07/24/2022)   triamcinolone acetonide (KENALOG-40) injection 80 mg   lidocaine (PF) (XYLOCAINE) 1 % injection 2 mL   Other Visit Diagnoses  Need for immunization against influenza       Relevant Orders   Flu Vaccine QUAD 14moIM (Fluarix, Fluzone & Alfiuria Quad PF) (Completed)       Return in about 3 months (around 08/08/2022).     ELucious Groves DO  PROCEDURE NOTE  PROCEDURE: right knee joint steroid injection.  PREOPERATIVE DIAGNOSIS: Osteoarthritis of the right knee.  POSTOPERATIVE DIAGNOSIS: Osteoarthritis of the right knee.  PROCEDURE: The patient was apprised of the risks and the benefits of the procedure and informed consent was obtained, as witnessed by [facility] Time-out procedure was performed, with confirmation of the patient's name, date of birth, and correct  identification of the right knee to be injected. The patient's knee was then marked at the appropriate site for injection placement. The knee was sterilely prepped with Betadine. A 80 mg (2 milliliter) solution of Kenalog was drawn up into a 5 mL syringe with a 2 mL of 1% lidocaine. The patient was injected with a 25-gauge needle at the anterior medial aspect of her right flexed knee. There were no complications. The patient tolerated the procedure well. There was minimal bleeding. The patient was instructed to ice her knee upon leaving clinic and refrain from overuse over the next 3 days. The patient was instructed to go to the emergency room with any usual pain, swelling, or redness occurred in the injected area. The patient was given a followup appointment to evaluate response to the injection to his increased range of motion and reduction of pain.

## 2022-05-08 NOTE — Assessment & Plan Note (Signed)
Well controlled 

## 2022-05-08 NOTE — Patient Instructions (Signed)
Breanna Wilkins , Thank you for taking time to come for your Medicare Wellness Visit. I appreciate your ongoing commitment to your health goals. Please review the following plan we discussed and let me know if I can assist you in the future.   These are the goals we discussed:  Goals      Blood Pressure < 140/90     CARE PLAN ENTRY (see longtitudinal plan of care for additional care plan information)  Objective:  Last practice recorded BP readings:  BP Readings from Last 3 Encounters:  01/26/20 (!) 117/58  12/21/19 129/69  10/20/19 (!) 168/92   Most recent eGFR/CrCl: No results found for: EGFR  No components found for: CRCL  Current Barriers:  Knowledge Deficits related to understanding of medications prescribed for management of hypertension Knowledge deficit related to self care management of hypertension Knowledge deficit related to dangers of uncontrolled hypertension Difficulty obtaining medications Behavior modification  Case Manager Clinical Goal(s):  Over the next 90 days, patient will verbalize understanding of plan for hypertension management Over the next 90 days, patient will attend all scheduled medical appointments: PCP and cardiologist Over the next 90 days, patient will demonstrate improved adherence to prescribed treatment plan for hypertension as evidenced by taking all medications as prescribed, monitoring and recording blood pressure as directed, adhering to low sodium/DASH diet Over the next 90 days, patient will demonstrate improved health management independence as evidenced by checking blood pressure as directed and notifying PCP if SBP>180 or DBP > 110, taking all medications as prescribe, and adhering to a low sodium diet as discussed. Over the next 90 days, patient will verbalize basic understanding of hypertension disease process and self health management plan as evidenced by blood pressure <140/90 and weight loss. Over the next 90 days, patient will utilize  portion control Over the next 90 days patient will try to start an exercise routine  Interventions:  Evaluation of current treatment plan related to hypertension self management and patient's adherence to plan as established by provider. Reviewed medications with patient and discussed importance of compliance Discussed plans with patient for ongoing care management follow up and provided patient with direct contact information for care management team Advised patient, providing education and rationale, to monitor blood pressure daily and record, calling PCP for findings outside established parameters.  Reviewed scheduled/upcoming provider appointments including:  Provided education regarding s/s of stroke and stroke prevention Provided education regarding s/s of heart attack Provided education regarding s/s of DASH diet/Low salt diet Provided education regarding complications of uncontrolled blood pressure Provided education regarding increasing physical activity Provided educational material: Exercise Program Activity Book Provided educational material:  EMMI (Weight Loss Tips, Weight Loss Diet, Chair Exercises, Relaxation Techniques, Low Sodium Diet, DASH Diet, Controlling your Blood Pressure through Lifestyle, High Blood Pressure Health Problems, High Blood Pressure Taking Your Blood Pressure, Lowering Your Rick of High Blood Pressure, Stress, etc)  Patient Self Care Activities:  Self administers medications as prescribed Attends all scheduled provider appointments Calls provider office for new concerns, questions, or BP outside discussed parameters Monitors BP and records as discussed Adheres to a low sodium diet/DASH diet Increase physical activity as tolerated Verbalize where to go to receive medical care Get a medical alert system RN will follow up outreach within the month of October      Exercise 3x per week (30 min per time)     "I would love to be able to walk and start  exercising"     LDL  CALC < 100     Weight < 308 lb (139.708 kg)        This is a list of the screening recommended for you and due dates:  Health Maintenance  Topic Date Due   COVID-19 Vaccine (1) Never done   Zoster (Shingles) Vaccine (1 of 2) Never done   Flu Shot  02/18/2022   Mammogram  07/19/2023   Tetanus Vaccine  05/29/2024   Colon Cancer Screening  03/14/2026   Hepatitis C Screening: USPSTF Recommendation to screen - Ages 18-79 yo.  Completed   HIV Screening  Completed   HPV Vaccine  Aged Out   Pap Smear  Discontinued

## 2022-05-11 NOTE — Progress Notes (Signed)
SURVIVORSHIP VISIT:  BRIEF ONCOLOGIC HISTORY:  Oncology History  Malignant neoplasm of upper-outer quadrant of left breast in female, estrogen receptor positive (Breanna Wilkins)  09/10/2021 Initial Diagnosis   Malignant neoplasm of upper-outer quadrant of left breast in female, estrogen receptor positive (Breanna Wilkins)    Genetic Testing   [facility] CancerNext-Expanded panel was Negative. Of note, a variant of uncertain significance was detected in the BRCA1 gene (p.I429T). Report date is 09/30/2021.   The CancerNext-Expanded gene panel offered by [facility] and includes sequencing, rearrangement, and RNA analysis for the following 77 genes: AIP, ALK, APC, ATM, AXIN2, BAP1, BARD1, BLM, BMPR1A, BRCA1, BRCA2, BRIP1, CDC73, CDH1, CDK4, CDKN1B, CDKN2A, CHEK2, CTNNA1, DICER1, FANCC, FH, FLCN, GALNT12, KIF1B, LZTR1, MAX, MEN1, MET, MLH1, MSH2, MSH3, MSH6, MUTYH, NBN, NF1, NF2, NTHL1, PALB2, PHOX2B, PMS2, POT1, PRKAR1A, PTCH1, PTEN, RAD51C, RAD51D, RB1, RECQL, RET, SDHA, SDHAF2, SDHB, SDHC, SDHD, SMAD4, SMARCA4, SMARCB1, SMARCE1, STK11, SUFU, TMEM127, TP53, TSC1, TSC2, VHL and XRCC2 (sequencing and deletion/duplication); EGFR, EGLN1, HOXB13, KIT, MITF, PDGFRA, POLD1, and POLE (sequencing only); EPCAM and GREM1 (deletion/duplication only).    10/03/2021 Surgery   A.   BREAST, LEFT, LUMPECTOMY:  -    Invasive carcinoma of no special type (ductal), grade 2, 18 mm in  greatest dimension, all margins negative for tumor.  -    Distance of invasive carcinoma from closest margin:         4 mm  (posterior margin).   B.   LYMPH NODE, LEFT AXILLA #1, SENTINEL, EXCISION:  -    Lymph node, negative for malignancy (0/1).   C.   LYMPH NODE, LEFT AXILLA #2, SENTINEL, EXCISION:  -    Lymph node with isolated tumor cells, metastatic deposit 0.2 mm in  greatest dimension and less than 200 cells.      INTERVAL HISTORY:  Breanna Wilkins to review her survivorship care plan detailing her treatment course for breast cancer, as well as  monitoring long-term side effects of that treatment, education regarding health maintenance, screening, and overall wellness and health promotion.     Overall, Breanna Wilkins reports feeling quite well   REVIEW OF SYSTEMS:  Review of Systems  Constitutional:  Negative for appetite change, chills, fatigue, fever and unexpected weight change.  HENT:   Negative for hearing loss, lump/mass and trouble swallowing.   Eyes:  Negative for eye problems and icterus.  Respiratory:  Negative for chest tightness, cough and shortness of breath.   Cardiovascular:  Negative for chest pain, leg swelling and palpitations.  Gastrointestinal:  Negative for abdominal distention, abdominal pain, constipation, diarrhea, nausea and vomiting.  Endocrine: Negative for hot flashes.  Genitourinary:  Negative for difficulty urinating.   Musculoskeletal:  Negative for arthralgias.  Skin:  Negative for itching and rash.  Neurological:  Negative for dizziness, extremity weakness, headaches and numbness.  Hematological:  Negative for adenopathy. Does not bruise/bleed easily.  Psychiatric/Behavioral:  Negative for depression. The patient is not nervous/anxious.   Breast: Denies any new nodularity, masses, tenderness, nipple changes, or nipple discharge.    ONCOLOGY TREATMENT TEAM:  1. Surgeon:  Dr. Barry Dienes at [facility] 2. Medical Oncologist: Dr. Chryl Heck  3. Radiation Oncologist: Dr. Lynnette Caffey @ [facility]    PAST MEDICAL/SURGICAL HISTORY:  Past Medical History:  Diagnosis Date  . Abdominal hernia   . Arthritis   . Breast cancer (Breanna Wilkins)   . CAD (coronary artery disease)    Perioperative non-STEMI March, 2014  //   cardiac catheterization at time of non-STEMI, March, 2014,  minor nonobstructive coronary disease, vigorous LV function, possibly vasospastic event versus stress cardiomyopathy. No further workup of coronary disease  . CHF (congestive heart failure) ([place])   . Diverticulosis of colon 10/17/2017   Noted  on colonoscopy 10/14/17  . Ejection fraction    65-70%, vigorous function, echo, March, 2011  . Ejection fraction    Vigorous function at time of catheterization October 06, 2012,  . GERD (gastroesophageal reflux disease)   . History of colonic polyps 10/17/2017   Colonoscopy 10/14/17  . Hyperlipidemia   . Hypertension   . Internal hemorrhoids without complication 40/76/8088   Noted on colonoscopy 10/14/17  . Lung nodule 12/2009   Very small, left upper lobe by CT June, 2011, one-year followup was stable  . Myocardial infarction ([place]) 09/2013  . OSA (obstructive sleep apnea)    CPAP  . Overweight(278.02)   . Pulmonary embolus ([place]) 01/16/2010   on Coumadin indefinitely  . Shortness of breath   . Shoulder pain   . Sleep apnea   . Warfarin anticoagulation    Past Surgical History:  Procedure Laterality Date  . ABDOMINAL HYSTERECTOMY  07/2002   laparoscopic assisted vaginal hysterectomy for menorrhagia, dysmenorrhea, anemia, fibroids  . BREAST BIOPSY Left 09/03/2021  . BREAST LUMPECTOMY WITH RADIOACTIVE SEED AND SENTINEL LYMPH NODE BIOPSY Left 10/03/2021   Procedure: LEFT BREAST LUMPECTOMY WITH RADIOACTIVE SEED AND SENTINEL LYMPH NODE BIOPSY;  Surgeon: [name], MD;  Location: [place];  Service: General;  Laterality: Left;  . COLOSTOMY    . INCISIONAL HERNIA REPAIR N/A 09/27/2012   Procedure: LAPAROSCOPIC INCISIONAL HERNIA possible open;  Surgeon: Adin Hector, MD;  Location: [place];  Service: General;  Laterality: N/A;  . INSERTION OF MESH N/A 09/27/2012   Procedure: INSERTION OF MESH;  Surgeon: Adin Hector, MD;  Location: [place];  Service: General;  Laterality: N/A;  . KNEE ARTHROSCOPY Left   . LEFT HEART CATHETERIZATION WITH CORONARY ANGIOGRAM N/A 10/06/2012   Procedure: LEFT HEART CATHETERIZATION WITH CORONARY ANGIOGRAM;  Surgeon: Sherren Mocha, MD;  Location: [facility] CATH LAB;  Service: Cardiovascular;  Laterality: N/A;     ALLERGIES:  No Known Allergies   CURRENT  MEDICATIONS:  Outpatient Encounter Medications as of 05/12/2022  Medication Sig  . anastrozole (ARIMIDEX) 1 MG tablet Take 1 tablet (1 mg total) by mouth daily.  Marland Kitchen aspirin 81 MG chewable tablet Chew 1 tablet (81 mg total) by mouth daily.  . carvedilol (COREG) 6.25 MG tablet Take 1 tablet (6.25 mg total) by mouth 2 (two) times daily with a meal.  . Cholecalciferol (VITAMIN D) 50 MCG (2000 UT) CAPS Take 2,000 Units by mouth daily.  . cyclobenzaprine (FEXMID) 7.5 MG tablet Take 1 tablet (7.5 mg total) by mouth 3 (three) times daily as needed for muscle spasms.  . diclofenac Sodium (VOLTAREN) 1 % GEL Apply 4g to knee four times a day as needed for pain, apply 2g to shoulder as needed for pain  . furosemide (LASIX) 20 MG tablet Take 1 tablet (20 mg total) by mouth daily.  . Insulin Pen Needle (NOVOFINE) 32G X 6 MM MISC 1 Units by Does not apply route daily.  Marland Kitchen lidocaine (LIDODERM) 5 % Place 1 patch onto the skin daily. Apply patch to right hip as needed  . lidocaine (XYLOCAINE) 5 % ointment Apply 1 application topically 2 (two) times daily as needed. (Patient not taking: Reported on 09/24/2021)  . nystatin (MYCOSTATIN/NYSTOP) powder Apply 1 Application topically 3 (three) times daily.  Marland Kitchen  olmesartan (BENICAR) 40 MG tablet Take 1 tablet (40 mg total) by mouth daily.  Derrill Memo ON 05/25/2022] oxyCODONE-acetaminophen (PERCOCET) 10-325 MG tablet Take 1 tablet by mouth every 6 (six) hours as needed for pain.  Derrill Memo ON 06/24/2022] oxyCODONE-acetaminophen (PERCOCET) 10-325 MG tablet Take 1 tablet by mouth every 6 (six) hours as needed for pain.  Derrill Memo ON 07/24/2022] oxyCODONE-acetaminophen (PERCOCET) 10-325 MG tablet Take 1 tablet by mouth every 6 (six) hours as needed for pain.  . polyethylene glycol (MIRALAX / GLYCOLAX) packet Take 17 g by mouth daily.  . rivaroxaban (XARELTO) 20 MG TABS tablet Take 1 tablet (20 mg total) by mouth daily with supper.  . rosuvastatin (CRESTOR) 5 MG tablet Take 1 tablet (5 mg  total) by mouth at bedtime.   Facility-Administered Encounter Medications as of 05/12/2022  Medication  . lidocaine (PF) (XYLOCAINE) 1 % injection 2 mL  . triamcinolone acetonide (KENALOG-40) injection 80 mg     ONCOLOGIC FAMILY HISTORY:  Family History  Problem Relation Age of Onset  . Diabetes Mother   . Ovarian cancer Mother 50  . Diabetes Father   . Diabetes Sister   . Colon polyps Sister   . Brain cancer Maternal Aunt        unsure if it was a brain primary or cancer that metastasized to the brain  . Cancer Paternal Uncle        unknown type  . Bone cancer Maternal Grandmother        unsure if it was a bone primary or cancer that metastasized to the bones  . Breast cancer Neg Hx   . Colon cancer Neg Hx   . Esophageal cancer Neg Hx   . Rectal cancer Neg Hx   . Stomach cancer Neg Hx      SOCIAL HISTORY:  Social History   Socioeconomic History  . Marital status: Single    Spouse name: Not on file  . Number of children: 0  . Years of education: 85  . Highest education level: 10th grade  Occupational History  . Occupation: Disabled  Tobacco Use  . Smoking status: Former    Types: Cigarettes    Quit date: 05/03/1988    Years since quitting: 34.0    Passive exposure: Never  . Smokeless tobacco: Never  Vaping Use  . Vaping Use: Never used  Substance and Sexual Activity  . Alcohol use: Yes    Alcohol/week: 1.0 standard drink of alcohol    Types: 1 Cans of beer per week    Comment: Wine sometimes.  . Drug use: Never  . Sexual activity: Not Currently  Other Topics Concern  . Not on file  Social History Narrative   Financial assistance approved for 100% discount at [facility] and has [facility] card per [place]6/01/2010      Current Social History 02/11/2021        Patient lives with sister in a home  that is 1 story. There are 4 steps up to the entrance the patient uses. There is a railing.       Patient's method of transportation is personal car.      The  highest level of education was some high school.      The patient currently is employed as aid on school bus.      Identified important Relationships are "my sister"       Pets : 0       Interests / Fun: "I like to  fish and I love my job"       Current Stressors: "none"       Religious / Personal Beliefs: "I don't know"        Social Determinants of Health   Financial Resource Strain: Low Risk  (02/10/2019)   Overall Financial Resource Strain (CARDIA)   . Difficulty of Paying Living Expenses: Not hard at all  Food Insecurity: No Food Insecurity (10/09/2021)   Hunger Vital Sign   . Worried About Charity fundraiser in the Last Year: Never true   . Ran Out of Food in the Last Year: Never true  Transportation Needs: No Transportation Needs (10/09/2021)   PRAPARE - Transportation   . Lack of Transportation (Medical): No   . Lack of Transportation (Non-Medical): No  Physical Activity: Unknown (02/10/2019)   Exercise Vital Sign   . Days of Exercise per Week: Patient refused   . Minutes of Exercise per Session: Patient refused  Stress: No Stress Concern Present (01/16/2021)   [place]   . Feeling of Stress : Not at all  Social Connections: Unknown (02/10/2019)   Social Connection and Isolation Panel [NHANES]   . Frequency of Communication with Friends and Family: More than three times a week   . Frequency of Social Gatherings with Friends and Family: Three times a week   . Attends Religious Services: Never   . Active Member of Clubs or Organizations: No   . Attends Archivist Meetings: Never   . Marital Status: Patient refused  Intimate Partner Violence: Not At Risk (07/03/2021)   Humiliation, Afraid, Rape, and Kick questionnaire   . Fear of Current or Ex-Partner: No   . Emotionally Abused: No   . Physically Abused: No   . Sexually Abused: No     OBSERVATIONS/OBJECTIVE:  There were no vitals taken  for this visit. GENERAL: Patient is a well appearing female in no acute distress HEENT:  Sclerae anicteric.  Oropharynx clear and moist. No ulcerations or evidence of oropharyngeal candidiasis. Neck is supple.  NODES:  No cervical, supraclavicular, or axillary lymphadenopathy palpated.  BREAST EXAM:  left breast s/p lumpectomy and radiation, no sign of local recurrence, right breast benign LUNGS:  Clear to auscultation bilaterally.  No wheezes or rhonchi. HEART:  Regular rate and rhythm. No murmur appreciated. ABDOMEN:  Soft, nontender.  Positive, normoactive bowel sounds. No organomegaly palpated. MSK:  No focal spinal tenderness to palpation. Full range of motion bilaterally in the upper extremities. EXTREMITIES:  No peripheral edema.   SKIN:  Clear with no obvious rashes or skin changes. No nail dyscrasia. NEURO:  Nonfocal. Well oriented.  Appropriate affect.   LABORATORY DATA:  None for this visit.  DIAGNOSTIC IMAGING:  None for this visit.      ASSESSMENT AND PLAN:  Ms.. Wilkins is a pleasant 61 y.o. female with Stage IA left breast invasive ductal carcinoma, ER+/PR+/HER2-, diagnosed in 08/2021, treated with lumpectomy, adjuvant radiation therapy, and anti-estrogen therapy with Anastrozole beginning in 02/2022.  She presents to the Survivorship Clinic for our initial meeting and routine follow-up post-completion of treatment for breast cancer.    1. Stage IA left breast cancer:  Ms. Creedon is continuing to recover from definitive treatment for breast cancer. She will follow-up with her medical oncologist, Dr. Chryl Heck in 6 months with history and physical exam per surveillance protocol.  She will continue her anti-estrogen therapy with Anastrozole. Thus far, she is tolerating the  Anastrozole well, with minimal side effects. She was instructed to make Dr. Lindi Adie or myself aware if she begins to experience any worsening side effects of the medication and I could see her back in clinic to  help manage those side effects, as needed. Her mammogram is due 07/2022; orders placed today. Today, a comprehensive survivorship care plan and treatment summary was reviewed with the patient today detailing her breast cancer diagnosis, treatment course, potential late/long-term effects of treatment, appropriate follow-up care with recommendations for the future, and patient education resources.  A copy of this summary, along with a letter will be sent to the patient's primary care provider via mail/fax/In Basket message after today's visit.    #. Problem(s) at Visit______________  #. Bone health:  Given Ms. Azucena's age/history of breast cancer and her current treatment regimen including anti-estrogen therapy with Anastrozole, she is at risk for bone demineralization.  Her last DEXA scan was ***, which showed ***.  In the meantime, she was encouraged to increase her consumption of foods rich in calcium, as well as increase her weight-bearing activities.  She was given education on specific activities to promote bone health.  #. Cancer screening:  Due to Ms. Allbee's history and her age, she should receive screening for skin cancers, colon cancer, and gynecologic cancers.  The information and recommendations are listed on the patient's comprehensive care plan/treatment summary and were reviewed in detail with the patient.    #. Health maintenance and wellness promotion: Ms. Mihok was encouraged to consume 5-7 servings of fruits and vegetables per day. We reviewed the "Nutrition Rainbow" handout, as well as the handout "Take Control of Your Health and Reduce Your Cancer Risk" from the [place].  She was also encouraged to engage in moderate to vigorous exercise for 30 minutes per day most days of the week. We discussed the [employer] fitness program, which is designed for cancer survivors to help them become more physically fit after cancer treatments.  She was instructed to limit her  alcohol consumption and continue to abstain from tobacco use/***was encouraged stop smoking.     #. Support services/counseling: It is not uncommon for this period of the patient's cancer care trajectory to be one of many emotions and stressors.  We discussed how this can be increasingly difficult during the times of quarantine and social distancing due to the COVID-19 pandemic.   She was given information regarding our available services and encouraged to contact me with any questions or for help enrolling in any of our support group/programs.    Follow up instructions:    -Return to cancer center ***  -Mammogram due in *** -Follow up with surgery *** -She is welcome to return back to the Survivorship Clinic at any time; no additional follow-up needed at this time.  -Consider referral back to survivorship as a long-term survivor for continued surveillance  The patient was provided an opportunity to ask questions and all were answered. The patient agreed with the plan and demonstrated an understanding of the instructions.   Total encounter time:*** minutes*in face-to-face visit time, chart review, lab review, care coordination, order entry, and documentation of the encounter time.    Wilber Bihari, NP 05/11/22 2:25 PM Medical Oncology and Hematology [facility] [place], [place] [address] Tel. [phone]    Fax. [phone]  *Total Encounter Time as defined by the Centers for Medicare and Medicaid Services includes, in addition to the face-to-face time of a patient  visit (documented in the note above) non-face-to-face time: obtaining and reviewing outside history, ordering and reviewing medications, tests or procedures, care coordination (communications with other health care professionals or caregivers) and documentation in the medical record.

## 2022-05-12 ENCOUNTER — Inpatient Hospital Stay: Payer: Medicare Other | Attending: Adult Health | Admitting: Adult Health

## 2022-05-12 ENCOUNTER — Encounter: Payer: Self-pay | Admitting: Adult Health

## 2022-05-12 VITALS — BP 147/54 | HR 73 | Temp 97.9°F | Resp 16 | Ht 67.0 in | Wt 296.3 lb

## 2022-05-12 DIAGNOSIS — Z7901 Long term (current) use of anticoagulants: Secondary | ICD-10-CM | POA: Diagnosis not present

## 2022-05-12 DIAGNOSIS — Z170 Estrogen receptor positive status [ER+]: Secondary | ICD-10-CM | POA: Diagnosis not present

## 2022-05-12 DIAGNOSIS — R232 Flushing: Secondary | ICD-10-CM | POA: Diagnosis not present

## 2022-05-12 DIAGNOSIS — C50412 Malignant neoplasm of upper-outer quadrant of left female breast: Secondary | ICD-10-CM | POA: Insufficient documentation

## 2022-05-12 DIAGNOSIS — Z79811 Long term (current) use of aromatase inhibitors: Secondary | ICD-10-CM | POA: Diagnosis not present

## 2022-05-12 DIAGNOSIS — Z86711 Personal history of pulmonary embolism: Secondary | ICD-10-CM | POA: Insufficient documentation

## 2022-05-12 DIAGNOSIS — Z7982 Long term (current) use of aspirin: Secondary | ICD-10-CM | POA: Insufficient documentation

## 2022-05-12 DIAGNOSIS — Z79899 Other long term (current) drug therapy: Secondary | ICD-10-CM | POA: Insufficient documentation

## 2022-05-12 DIAGNOSIS — Z923 Personal history of irradiation: Secondary | ICD-10-CM | POA: Insufficient documentation

## 2022-05-12 NOTE — Addendum Note (Signed)
Addended by: [employer]a [employer] D on: 05/12/2022 01:36 PM   Modules accepted: Orders

## 2022-05-13 ENCOUNTER — Other Ambulatory Visit: Payer: Self-pay

## 2022-05-13 ENCOUNTER — Telehealth: Payer: Self-pay

## 2022-05-13 LAB — TOXASSURE SELECT,+ANTIDEPR,UR

## 2022-05-13 MED ORDER — SEMAGLUTIDE(0.25 OR 0.5MG/DOS) 2 MG/3ML ~~LOC~~ SOPN: PEN_INJECTOR | SUBCUTANEOUS | 1 refills | Status: AC

## 2022-05-13 NOTE — Telephone Encounter (Signed)
Since it has been a few months since her last dose will start her back at 0.'25mg'$  and titrate back up to the '2mg'$  dose.

## 2022-05-13 NOTE — Telephone Encounter (Signed)
Pt would like Dr. [name] [place] to send in Rx for [place] @ [facility] in [place].

## 2022-06-17 ENCOUNTER — Telehealth: Payer: Self-pay

## 2022-06-17 NOTE — Telephone Encounter (Signed)
Return pt's call. She wanted to know if the medication started with "Ros" (she's not currently at home ) is a potassium pill. I told pt Rosuvastatin is for high cholesterol. And she wanted to know can it cause pain in her bones. I told her it can - stated she's having pain "all over " her body. She wants to know if her doctor can stop or switch medication?

## 2022-06-17 NOTE — Telephone Encounter (Signed)
Requesting to speak with a nurse about potassium pill. Please call pt back.

## 2022-06-18 ENCOUNTER — Other Ambulatory Visit: Payer: Self-pay | Admitting: *Deleted

## 2022-06-18 DIAGNOSIS — Z7901 Long term (current) use of anticoagulants: Secondary | ICD-10-CM

## 2022-06-18 DIAGNOSIS — I2609 Other pulmonary embolism with acute cor pulmonale: Secondary | ICD-10-CM

## 2022-06-18 DIAGNOSIS — I82533 Chronic embolism and thrombosis of popliteal vein, bilateral: Secondary | ICD-10-CM

## 2022-06-18 MED ORDER — RIVAROXABAN 20 MG PO TABS: 20.0000 mg | ORAL_TABLET | Freq: Every day | ORAL | 3 refills | Status: DC

## 2022-06-18 NOTE — Telephone Encounter (Signed)
I called her back, she has been on crestor for quite some time and reports this pain just recently started.  She thinks it may be magnesium oxide that her oncologist recently started given the timeline.  I discussed would be fine for her to hold that medication for a few days and call me back early next week, we can always change to a different preparation if needed.

## 2022-06-30 ENCOUNTER — Ambulatory Visit: Payer: Medicare Other | Attending: General Surgery

## 2022-06-30 ENCOUNTER — Telehealth: Payer: Self-pay

## 2022-06-30 ENCOUNTER — Other Ambulatory Visit: Payer: Self-pay

## 2022-06-30 VITALS — Wt 290.0 lb

## 2022-06-30 DIAGNOSIS — Z170 Estrogen receptor positive status [ER+]: Secondary | ICD-10-CM

## 2022-06-30 DIAGNOSIS — Z483 Aftercare following surgery for neoplasm: Secondary | ICD-10-CM | POA: Insufficient documentation

## 2022-06-30 NOTE — Therapy (Signed)
OUTPATIENT PHYSICAL THERAPY SOZO SCREENING NOTE   Patient Name: Breanna Wilkins MRN: [id number] DOB:Oct 12, 1960, 61 y.o., female Today's Date: 06/30/2022  PCP: Lucious Groves, DO REFERRING PROVIDER: Lucious Groves, DO   PT End of Session - 06/30/22 1034     Visit Number 1   # unchanged due to screen only   PT Start Time 1032    PT Stop Time 1037    PT Time Calculation (min) 5 min    Activity Tolerance Patient tolerated treatment well    Behavior During Therapy [facility] for tasks assessed/performed             Past Medical History:  Diagnosis Date   Abdominal hernia    Arthritis    Breast cancer ([place])    CAD (coronary artery disease)    Perioperative non-STEMI March, 2014  //   cardiac catheterization at time of non-STEMI, March, 2014, minor nonobstructive coronary disease, vigorous LV function, possibly vasospastic event versus stress cardiomyopathy. No further workup of coronary disease   CHF (congestive heart failure) ([place])    Diverticulosis of colon 10/17/2017   Noted on colonoscopy 10/14/17   Ejection fraction    65-70%, vigorous function, echo, March, 2011   Ejection fraction    Vigorous function at time of catheterization October 06, 2012,   GERD (gastroesophageal reflux disease)    History of colonic polyps 10/17/2017   Colonoscopy 10/14/17   Hyperlipidemia    Hypertension    Internal hemorrhoids without complication 63/84/5364   Noted on colonoscopy 10/14/17   Lung nodule 12/2009   Very small, left upper lobe by CT June, 2011, one-year followup was stable   Myocardial infarction ([place]) 09/2013   OSA (obstructive sleep apnea)    CPAP   Overweight(278.02)    Pulmonary embolus ([place]) 01/16/2010   on Coumadin indefinitely   Shortness of breath    Shoulder pain    Sleep apnea    Warfarin anticoagulation    Past Surgical History:  Procedure Laterality Date   ABDOMINAL HYSTERECTOMY  07/2002   laparoscopic assisted vaginal hysterectomy for menorrhagia,  dysmenorrhea, anemia, fibroids   BREAST BIOPSY Left 09/03/2021   BREAST LUMPECTOMY WITH RADIOACTIVE SEED AND SENTINEL LYMPH NODE BIOPSY Left 10/03/2021   Procedure: LEFT BREAST LUMPECTOMY WITH RADIOACTIVE SEED AND SENTINEL LYMPH NODE BIOPSY;  Surgeon: [name], MD;  Location: [place];  Service: General;  Laterality: Left;   COLOSTOMY     INCISIONAL HERNIA REPAIR N/A 09/27/2012   Procedure: LAPAROSCOPIC INCISIONAL HERNIA possible open;  Surgeon: Adin Hector, MD;  Location: [place];  Service: General;  Laterality: N/A;   INSERTION OF MESH N/A 09/27/2012   Procedure: INSERTION OF MESH;  Surgeon: Adin Hector, MD;  Location: [place];  Service: General;  Laterality: N/A;   KNEE ARTHROSCOPY Left    LEFT HEART CATHETERIZATION WITH CORONARY ANGIOGRAM N/A 10/06/2012   Procedure: LEFT HEART CATHETERIZATION WITH CORONARY ANGIOGRAM;  Surgeon: Sherren Mocha, MD;  Location: [facility] CATH LAB;  Service: Cardiovascular;  Laterality: N/A;   Patient Active Problem List   Diagnosis Date Noted   Preop examination 10/02/2021   Genetic testing 09/27/2021   Family history of ovarian cancer 09/12/2021   Malignant neoplasm of upper-outer quadrant of left breast in female, estrogen receptor positive ([employer]) 09/10/2021   Neck mass 02/22/2021   Musculoskeletal neck pain 05/22/2020   Leg pain, posterior, left 12/21/2019   Strain of rectus abdominis muscle 09/06/2019   Sciatica of left side 11/10/2018   Meralgia  paraesthetica 10/20/2018   Chronic fatigue 10/01/2018   Diverticulosis of colon 10/17/2017   Internal hemorrhoids without complication 41/93/7902   History of colonic polyps 10/17/2017   BRBPR (bright red blood per rectum) 08/25/2017   Chronic pain 07/10/2017   Long term (current) use of opiate analgesic 09/17/2016   Vitamin D deficiency 09/17/2016   Primary osteoarthritis of right knee 03/04/2016   Nipple dermatitis 11/20/2015   Prediabetes 06/07/2015   Morbid obesity with BMI of 45.0-49.9, adult  ([place]) 06/07/2015   GERD (gastroesophageal reflux disease) 05/29/2014   Atypical chest pain 02/23/2014   Plantar fasciitis of right foot 02/07/2014   Carpal tunnel syndrome of right wrist 12/05/2013   Pain of left calf 11/03/2013   Preventative health care 05/12/2013   CHF NYHA class II (symptoms with moderately strenuous activities) ([place]) 10/11/2012   Hyperlipidemia 08/31/2012   History of DVT (deep vein thrombosis) 03/20/2011   Long term (current) use of anticoagulants 08/09/2010   Supraspinatus tendon tear 02/11/2010   History of pulmonary embolism 01/16/2010   Severe obstructive sleep apnea 06/29/2009   Essential hypertension, benign 06/04/2009    REFERRING DIAG: left breast cancer at risk for lymphedema  THERAPY DIAG: Aftercare following surgery for neoplasm  PERTINENT HISTORY: Patient was diagnosed on 07/18/2022 with left grade I-II invasive ductal carcinoma breast cancer. She underwent a left lumpectomy and sentinel node biopsy (2 nodes - 1 negative and 1 with isolated tumor cells) on 10/03/2021. It is ER/PR positive and HER2 negative with a Ki67 of < 5%.     PRECAUTIONS: left UE Lymphedema risk  SUBJECTIVE: Pt returns for her 3 month L-Dex screen.  "My breast is really heavy since completing radiation recently. Also my legs have been feeling weak and tight. I want to get physical therapy for both."  PAIN:  Are you having pain? Yes - bilateral leg pain which is chronic  SOZO SCREENING: Patient was assessed today using the SOZO machine to determine the lymphedema index score. This was compared to her baseline score. It was determined that she is within the recommended range when compared to her baseline and no further action is needed at this time. She will continue SOZO screenings. These are done every 3 months for 2 years post operatively followed by every 6 months for 2 years, and then annually.  Patient reported a change in status to PTA which initiated the PTA consulting  with a PT. PT determined it would be appropriate to initiate therapy at this time. PT requested a referral from patient's provider. Requested referral be sent to [facility] per pts request as she reports just moving to [place].     L-DEX FLOWSHEETS - 06/30/22 1000       L-DEX LYMPHEDEMA SCREENING   Measurement Type Unilateral    L-DEX MEASUREMENT EXTREMITY Upper Extremity    POSITION  Standing    DOMINANT SIDE Right    At Risk Side Left    BASELINE SCORE (UNILATERAL) 6    L-DEX SCORE (UNILATERAL) 8.3    VALUE CHANGE (UNILAT) 2.3             L-DEX FLOWSHEETS - 06/30/22 1000       L-DEX LYMPHEDEMA SCREENING   Measurement Type Unilateral    L-DEX MEASUREMENT EXTREMITY Upper Extremity    POSITION  Standing    DOMINANT SIDE Right    At Risk Side Left    BASELINE SCORE (UNILATERAL) 6    L-DEX SCORE (UNILATERAL) 8.3    VALUE CHANGE (  UNILAT) 2.3              Collie Siad, PTA 06/30/22 10:39 AM

## 2022-06-30 NOTE — Telephone Encounter (Signed)
Placed Epic referral to [facility] for bilateral leg pain and for breast lymphedema per Dr. Chryl Heck.

## 2022-07-02 ENCOUNTER — Other Ambulatory Visit: Payer: Self-pay

## 2022-07-02 MED ORDER — OLMESARTAN MEDOXOMIL 40 MG PO TABS: 40.0000 mg | ORAL_TABLET | Freq: Every day | ORAL | 3 refills | Status: DC

## 2022-07-09 ENCOUNTER — Encounter: Payer: Medicare Other | Admitting: Student

## 2022-07-09 ENCOUNTER — Other Ambulatory Visit: Payer: Self-pay

## 2022-07-10 ENCOUNTER — Encounter: Payer: Medicare Other | Admitting: Student

## 2022-08-04 ENCOUNTER — Ambulatory Visit (HOSPITAL_COMMUNITY): Payer: 59 | Attending: Hematology and Oncology | Admitting: Physical Therapy

## 2022-08-04 DIAGNOSIS — R2689 Other abnormalities of gait and mobility: Secondary | ICD-10-CM

## 2022-08-04 DIAGNOSIS — M79604 Pain in right leg: Secondary | ICD-10-CM

## 2022-08-04 DIAGNOSIS — Z170 Estrogen receptor positive status [ER+]: Secondary | ICD-10-CM | POA: Diagnosis not present

## 2022-08-04 DIAGNOSIS — C50412 Malignant neoplasm of upper-outer quadrant of left female breast: Secondary | ICD-10-CM | POA: Insufficient documentation

## 2022-08-04 DIAGNOSIS — M79605 Pain in left leg: Secondary | ICD-10-CM

## 2022-08-04 NOTE — Therapy (Signed)
OUTPATIENT PHYSICAL THERAPY LOWER EXTREMITY EVALUATION   Patient Name: Breanna Wilkins MRN: [id number] DOB:[birth year], 62 y.o., female Today's Date: 08/04/2022  END OF SESSION:  PT End of Session - 08/04/22 1022     Visit Number 1    Number of Visits 12    Date for PT Re-Evaluation 09/15/22    Authorization Type Medicaid    Authorization Time Period Check auth    Authorization - Visit Number 1    Authorization - Number of Visits 1    PT Start Time 0950    PT Stop Time 0034    PT Time Calculation (min) 38 min    Activity Tolerance Patient tolerated treatment well    Behavior During Therapy [facility] for tasks assessed/performed             Past Medical History:  Diagnosis Date   Abdominal hernia    Arthritis    Breast cancer ([place])    CAD (coronary artery disease)    Perioperative non-STEMI March, 2014  //   cardiac catheterization at time of non-STEMI, March, 2014, minor nonobstructive coronary disease, vigorous LV function, possibly vasospastic event versus stress cardiomyopathy. No further workup of coronary disease   CHF (congestive heart failure) ([place])    Diverticulosis of colon 10/17/2017   Noted on colonoscopy 10/14/17   Ejection fraction    65-70%, vigorous function, echo, March, 2011   Ejection fraction    Vigorous function at time of catheterization October 06, 2012,   GERD (gastroesophageal reflux disease)    History of colonic polyps 10/17/2017   Colonoscopy 10/14/17   Hyperlipidemia    Hypertension    Internal hemorrhoids without complication 91/79/1505   Noted on colonoscopy 10/14/17   Lung nodule 12/2009   Very small, left upper lobe by CT June, 2011, one-year followup was stable   Myocardial infarction ([place]) 09/2013   OSA (obstructive sleep apnea)    CPAP   Overweight(278.02)    Pulmonary embolus ([place]) 01/16/2010   on Coumadin indefinitely   Shortness of breath    Shoulder pain    Sleep apnea    Warfarin anticoagulation    Past Surgical History:   Procedure Laterality Date   ABDOMINAL HYSTERECTOMY  07/2002   laparoscopic assisted vaginal hysterectomy for menorrhagia, dysmenorrhea, anemia, fibroids   BREAST BIOPSY Left 09/03/2021   BREAST LUMPECTOMY WITH RADIOACTIVE SEED AND SENTINEL LYMPH NODE BIOPSY Left 10/03/2021   Procedure: LEFT BREAST LUMPECTOMY WITH RADIOACTIVE SEED AND SENTINEL LYMPH NODE BIOPSY;  Surgeon: [name], MD;  Location: [place];  Service: General;  Laterality: Left;   COLOSTOMY     INCISIONAL HERNIA REPAIR N/A 09/27/2012   Procedure: LAPAROSCOPIC INCISIONAL HERNIA possible open;  Surgeon: Adin Hector, MD;  Location: [place];  Service: General;  Laterality: N/A;   INSERTION OF MESH N/A 09/27/2012   Procedure: INSERTION OF MESH;  Surgeon: Adin Hector, MD;  Location: [place];  Service: General;  Laterality: N/A;   KNEE ARTHROSCOPY Left    LEFT HEART CATHETERIZATION WITH CORONARY ANGIOGRAM N/A 10/06/2012   Procedure: LEFT HEART CATHETERIZATION WITH CORONARY ANGIOGRAM;  Surgeon: Sherren Mocha, MD;  Location: [facility] CATH LAB;  Service: Cardiovascular;  Laterality: N/A;   Patient Active Problem List   Diagnosis Date Noted   Preop examination 10/02/2021   Genetic testing 09/27/2021   Family history of ovarian cancer 09/12/2021   Malignant neoplasm of upper-outer quadrant of left breast in female, estrogen receptor positive ([place]) 09/10/2021   Neck mass 02/22/2021  Musculoskeletal neck pain 05/22/2020   Leg pain, posterior, left 12/21/2019   Strain of rectus abdominis muscle 09/06/2019   Sciatica of left side 11/10/2018   Meralgia paraesthetica 10/20/2018   Chronic fatigue 10/01/2018   Diverticulosis of colon 10/17/2017   Internal hemorrhoids without complication 02/40/9735   History of colonic polyps 10/17/2017   BRBPR (bright red blood per rectum) 08/25/2017   Chronic pain 07/10/2017   Long term (current) use of opiate analgesic 09/17/2016   Vitamin D deficiency 09/17/2016   Primary osteoarthritis of right  knee 03/04/2016   Nipple dermatitis 11/20/2015   Prediabetes 06/07/2015   Morbid obesity with BMI of 45.0-49.9, adult ([place]) 06/07/2015   GERD (gastroesophageal reflux disease) 05/29/2014   Atypical chest pain 02/23/2014   Plantar fasciitis of right foot 02/07/2014   Carpal tunnel syndrome of right wrist 12/05/2013   Pain of left calf 11/03/2013   Preventative health care 05/12/2013   CHF NYHA class II (symptoms with moderately strenuous activities) ([place]) 10/11/2012   Hyperlipidemia 08/31/2012   History of DVT (deep vein thrombosis) 03/20/2011   Long term (current) use of anticoagulants 08/09/2010   Supraspinatus tendon tear 02/11/2010   History of pulmonary embolism 01/16/2010   Severe obstructive sleep apnea 06/29/2009   Essential hypertension, benign 06/04/2009    PCP: Joni Reining DO  REFERRING PROVIDER: Joni Reining DO  REFERRING DIAG: C50.412,Z17.0 (ICD-10-CM) - Malignant neoplasm of upper-outer quadrant of left breast in female, estrogen receptor positive ([place])  THERAPY DIAG:  Other abnormalities of gait and mobility - Plan: PT plan of care cert/re-cert  Pain in right leg - Plan: PT plan of care cert/re-cert  Pain in left leg - Plan: PT plan of care cert/re-cert  Rationale for Evaluation and Treatment: Rehabilitation  ONSET DATE: about 1 year   SUBJECTIVE:   SUBJECTIVE STATEMENT: Patient presents to therapy with complaint of bilateral leg pain. She says she feels some of the pain may be related to her cancer treatments. She also reports history of bilateral knee OA. She had arthroscopic surgery on LT knee and has "bone on bone" on RT knee. She currently takes pain meds to manage symptoms.   PERTINENT HISTORY: Cancer, knee OA  PAIN:  Are you having pain? Yes: NPRS scale: 8-9/10 Pain location: bilat knee and lower legs  Pain description: stabbing, aching  Aggravating factors: standing, WB  Relieving factors: rest/ NWB and meds  PRECAUTIONS: None  WEIGHT  BEARING RESTRICTIONS: No  FALLS:  Has patient fallen in last 6 months? 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   PLOF: Independent with basic ADLs  PATIENT GOALS: Be able to walk better   NEXT MD VISIT:   OBJECTIVE:   DIAGNOSTIC FINDINGS: NA  PATIENT SURVEYS:  LEFS 22/80  COGNITION: Overall cognitive status: Within functional limits for tasks assessed    EDEMA:  See lymphedema assessment (scheduled)   LOWER EXTREMITY ROM:  Active ROM Right eval Left eval  Hip flexion    Hip extension    Hip abduction    Hip adduction    Hip internal rotation    Hip external rotation    Knee flexion 90 100  Knee extension 0 0  Ankle dorsiflexion    Ankle plantarflexion    Ankle inversion    Ankle eversion     (Blank rows = not tested)  LOWER EXTREMITY MMT:  MMT Right eval Left eval  Hip flexion 4 4  Hip extension    Hip abduction    Hip adduction    Hip internal rotation    Hip external rotation    Knee flexion 4+ 4+  Knee extension 4 4  Ankle dorsiflexion 5 5  Ankle plantarflexion    Ankle inversion    Ankle eversion     (Blank rows = not tested)  FUNCTIONAL TESTS:  5 times sit to stand: 24.2 sec with Ues  2 minute walk test: 290 feet using SPC   GAIT: Antalgic, decreased stride, trendelenburg  TODAY'S TREATMENT:                                                                                                                              DATE:  08/04/22 Eval     PATIENT EDUCATION:  Education details: on Eval findings, POC and HEP  Person educated: Patient Education method: Explanation Education comprehension: verbalized understanding  HOME EXERCISE PROGRAM: Access Code: [email or web address] URL: [email or web address] Date: 08/04/2022 Prepared by: Josue Hector  Exercises - Seated  March  - 2-3 x daily - 7 x weekly - 2 sets - 10 reps - Seated Long Arc Quad  - 2-3 x daily - 7 x weekly - 2 sets - 10 reps - Seated Knee Flexion Extension AROM   - 2-3 x daily - 7 x weekly - 1 sets - 10 reps - 5 second hold  ASSESSMENT:  CLINICAL IMPRESSION: Patient is a 62 y.o. female who presents to physical therapy with complaint of Bil LE pain. Patient demonstrates muscle weakness, reduced ROM, and fascial restrictions which are likely contributing to symptoms of pain and are negatively impacting patient ability to perform ADLs and functional mobility tasks. Patient will benefit from skilled physical therapy services to address these deficits to reduce pain and improve level of function with ADLs and functional mobility tasks.   OBJECTIVE IMPAIRMENTS: Abnormal gait, decreased activity tolerance, decreased balance, decreased endurance, decreased mobility, difficulty walking, decreased ROM, decreased strength, hypomobility, increased edema, improper body mechanics, and pain.   ACTIVITY LIMITATIONS: carrying, lifting, bending, sitting, standing, squatting, sleeping, stairs, transfers, bed mobility, and locomotion level  PARTICIPATION LIMITATIONS: meal prep, cleaning, laundry, shopping, community activity, occupation, and yard work  PERSONAL FACTORS: Time since onset of injury/illness/exacerbation are also affecting patient's functional outcome.   REHAB POTENTIAL: Good  CLINICAL DECISION MAKING: Stable/uncomplicated  EVALUATION COMPLEXITY: Low   GOALS: SHORT TERM GOALS: Target date: 08/25/2022  Patient will be independent with initial HEP and self-management strategies to improve functional outcomes Baseline:  Goal status: INITIAL    LONG TERM GOALS: Target date: 09/15/2022  Patient will be independent with advanced HEP and self-management strategies to improve functional outcomes Baseline:  Goal status: INITIAL  2.  Patient will improve LEFS bat least 18 points indicate  improvement in functional outcomes Baseline: 22/80 Goal status: INITIAL  3.  Patient will have Bilat knee AROM 0-105  degrees to improve functional mobility and facilitate squatting to pick up items from floor. Baseline: See AROM Goal status: INITIAL  4. Patient will have equal to or > 4+/5 MMT throughout tested BLE to improve ability to perform functional mobility, stair ambulation and ADLs.  Baseline: See MMT Goal status: INITIAL  5. Patient will be able to ambulate at least 350 feet during 2MWT with LRAD to demonstrate improved ability to perform functional mobility and associated tasks. Baseline: 290 feet using SPC  Goal status: INITIAL PLAN:  PT FREQUENCY: 1-2x/week  PT DURATION: 6 weeks  PLANNED INTERVENTIONS: Therapeutic exercises, Therapeutic activity, Neuromuscular re-education, Balance training, Gait training, Patient/Family education, Joint manipulation, Joint mobilization, Stair training, Aquatic Therapy, Dry Needling, Electrical stimulation, Spinal manipulation, Spinal mobilization, Cryotherapy, Moist heat, scar mobilization, Taping, Traction, Ultrasound, Biofeedback, Ionotophoresis '4mg'$ /ml Dexamethasone, and Manual therapy.   PLAN FOR NEXT SESSION: Progress LE strengthening as tolerated. Gait and balance progressions when ready.    10:25 AM, 08/04/22 Josue Hector PT DPT  Physical Therapist with [facility]  [phone]

## 2022-08-07 ENCOUNTER — Ambulatory Visit
Admission: RE | Admit: 2022-08-07 | Discharge: 2022-08-07 | Disposition: A | Discharge status: Home / Self Care | Payer: 59 | Source: Ambulatory Visit | Attending: Hematology and Oncology | Admitting: Hematology and Oncology

## 2022-08-07 DIAGNOSIS — C50412 Malignant neoplasm of upper-outer quadrant of left female breast: Secondary | ICD-10-CM

## 2022-08-14 ENCOUNTER — Ambulatory Visit (HOSPITAL_COMMUNITY): Payer: 59 | Admitting: Physical Therapy

## 2022-08-14 DIAGNOSIS — M79605 Pain in left leg: Secondary | ICD-10-CM

## 2022-08-14 DIAGNOSIS — R2689 Other abnormalities of gait and mobility: Secondary | ICD-10-CM | POA: Diagnosis not present

## 2022-08-14 DIAGNOSIS — M79604 Pain in right leg: Secondary | ICD-10-CM

## 2022-08-14 NOTE — Therapy (Addendum)
OUTPATIENT PHYSICAL THERAPY TREATMENT   Patient Name: Breanna Wilkins MRN: [id number] DOB:10-14-1960, 62 y.o., female Today's Date: 08/14/2022  END OF SESSION:  PT End of Session - 08/14/22 0947     Visit Number 2    Number of Visits 12    Date for PT Re-Evaluation 09/15/22    Authorization Type Medicaid    Authorization Time Period 3 visits approved 1/23-2/5    Authorization - Visit Number 1    Authorization - Number of Visits 3    Progress Note Due on Visit 4    PT Start Time 0945    PT Stop Time 1030    PT Time Calculation (min) 45 min    Activity Tolerance Patient tolerated treatment well    Behavior During Therapy [facility] for tasks assessed/performed             Past Medical History:  Diagnosis Date   Abdominal hernia    Arthritis    Breast cancer ([place])    CAD (coronary artery disease)    Perioperative non-STEMI March, 2014  //   cardiac catheterization at time of non-STEMI, March, 2014, minor nonobstructive coronary disease, vigorous LV function, possibly vasospastic event versus stress cardiomyopathy. No further workup of coronary disease   CHF (congestive heart failure) ([place])    Diverticulosis of colon 10/17/2017   Noted on colonoscopy 10/14/17   Ejection fraction    65-70%, vigorous function, echo, March, 2011   Ejection fraction    Vigorous function at time of catheterization October 06, 2012,   GERD (gastroesophageal reflux disease)    History of colonic polyps 10/17/2017   Colonoscopy 10/14/17   Hyperlipidemia    Hypertension    Internal hemorrhoids without complication 81/44/8185   Noted on colonoscopy 10/14/17   Lung nodule 12/2009   Very small, left upper lobe by CT June, 2011, one-year followup was stable   Myocardial infarction ([place]) 09/2013   OSA (obstructive sleep apnea)    CPAP   Overweight(278.02)    Pulmonary embolus ([place]) 01/16/2010   on Coumadin indefinitely   Shortness of breath    Shoulder pain    Sleep apnea    Warfarin anticoagulation     Past Surgical History:  Procedure Laterality Date   ABDOMINAL HYSTERECTOMY  07/2002   laparoscopic assisted vaginal hysterectomy for menorrhagia, dysmenorrhea, anemia, fibroids   BREAST BIOPSY Left 09/03/2021   BREAST LUMPECTOMY     BREAST LUMPECTOMY WITH RADIOACTIVE SEED AND SENTINEL LYMPH NODE BIOPSY Left 10/03/2021   Procedure: LEFT BREAST LUMPECTOMY WITH RADIOACTIVE SEED AND SENTINEL LYMPH NODE BIOPSY;  Surgeon: [name], MD;  Location: [place];  Service: General;  Laterality: Left;   COLOSTOMY     INCISIONAL HERNIA REPAIR N/A 09/27/2012   Procedure: LAPAROSCOPIC INCISIONAL HERNIA possible open;  Surgeon: Adin Hector, MD;  Location: [place];  Service: General;  Laterality: N/A;   INSERTION OF MESH N/A 09/27/2012   Procedure: INSERTION OF MESH;  Surgeon: Adin Hector, MD;  Location: [place];  Service: General;  Laterality: N/A;   KNEE ARTHROSCOPY Left    LEFT HEART CATHETERIZATION WITH CORONARY ANGIOGRAM N/A 10/06/2012   Procedure: LEFT HEART CATHETERIZATION WITH CORONARY ANGIOGRAM;  Surgeon: Sherren Mocha, MD;  Location: [facility] CATH LAB;  Service: Cardiovascular;  Laterality: N/A;   Patient Active Problem List   Diagnosis Date Noted   Preop examination 10/02/2021   Genetic testing 09/27/2021   Family history of ovarian cancer 09/12/2021   Malignant neoplasm of upper-outer quadrant of  left breast in female, estrogen receptor positive ([place]) 09/10/2021   Neck mass 02/22/2021   Musculoskeletal neck pain 05/22/2020   Leg pain, posterior, left 12/21/2019   Strain of rectus abdominis muscle 09/06/2019   Sciatica of left side 11/10/2018   Meralgia paraesthetica 10/20/2018   Chronic fatigue 10/01/2018   Diverticulosis of colon 10/17/2017   Internal hemorrhoids without complication 00/17/4944   History of colonic polyps 10/17/2017   BRBPR (bright red blood per rectum) 08/25/2017   Chronic pain 07/10/2017   Long term (current) use of opiate analgesic 09/17/2016   Vitamin D  deficiency 09/17/2016   Primary osteoarthritis of right knee 03/04/2016   Nipple dermatitis 11/20/2015   Prediabetes 06/07/2015   Morbid obesity with BMI of 45.0-49.9, adult ([place]) 06/07/2015   GERD (gastroesophageal reflux disease) 05/29/2014   Atypical chest pain 02/23/2014   Plantar fasciitis of right foot 02/07/2014   Carpal tunnel syndrome of right wrist 12/05/2013   Pain of left calf 11/03/2013   Preventative health care 05/12/2013   CHF NYHA class II (symptoms with moderately strenuous activities) ([place]) 10/11/2012   Hyperlipidemia 08/31/2012   History of DVT (deep vein thrombosis) 03/20/2011   Long term (current) use of anticoagulants 08/09/2010   Supraspinatus tendon tear 02/11/2010   History of pulmonary embolism 01/16/2010   Severe obstructive sleep apnea 06/29/2009   Essential hypertension, benign 06/04/2009    PCP: Joni Reining DO  REFERRING PROVIDER: Joni Reining DO  REFERRING DIAG: C50.412,Z17.0 (ICD-10-CM) - Malignant neoplasm of upper-outer quadrant of left breast in female, estrogen receptor positive ([place])  THERAPY DIAG:  Other abnormalities of gait and mobility  Pain in right leg  Pain in left leg  Rationale for Evaluation and Treatment: Rehabilitation  ONSET DATE: about 1 year   SUBJECTIVE:   SUBJECTIVE STATEMENT: Pt reports 8/10 in bil knees.  States she mostly uses her 4 wheeled walker at home.  States she just finished radiation treatments on her LT breast.  Evaluation: Patient presents to therapy with complaint of bilateral leg pain. She says she feels some of the pain may be related to her cancer treatments. She also reports history of bilateral knee OA. She had arthroscopic surgery on LT knee and has "bone on bone" on RT knee. She currently takes pain meds to manage symptoms.   PERTINENT HISTORY: Cancer, knee OA  PAIN:  Are you having pain? Yes: NPRS scale: 8-9/10 Pain location: bilat knee and lower legs  Pain description: stabbing, aching   Aggravating factors: standing, WB  Relieving factors: rest/ NWB and meds  PRECAUTIONS: None  WEIGHT BEARING RESTRICTIONS: No  FALLS:  Has patient fallen in last 6 months? 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   PLOF: Independent with basic ADLs  PATIENT GOALS: Be able to walk better   NEXT MD VISIT:   OBJECTIVE:   DIAGNOSTIC FINDINGS: NA  PATIENT SURVEYS:  LEFS 22/80  COGNITION: Overall cognitive status: Within functional limits for tasks assessed    EDEMA:  See lymphedema assessment (scheduled)   LOWER EXTREMITY ROM:  Active ROM Right eval Left eval  Hip flexion    Hip extension    Hip abduction    Hip adduction    Hip internal rotation    Hip external rotation    Knee flexion 90 100  Knee extension 0 0  Ankle dorsiflexion  Ankle plantarflexion    Ankle inversion    Ankle eversion     (Blank rows = not tested)  LOWER EXTREMITY MMT:  MMT Right eval Left eval  Hip flexion 4 4  Hip extension    Hip abduction    Hip adduction    Hip internal rotation    Hip external rotation    Knee flexion 4+ 4+  Knee extension 4 4  Ankle dorsiflexion 5 5  Ankle plantarflexion    Ankle inversion    Ankle eversion     (Blank rows = not tested)  FUNCTIONAL TESTS:  5 times sit to stand: 24.2 sec with Ues  2 minute walk test: 290 feet using SPC   GAIT: Antalgic, decreased stride, trendelenburg  TODAY'S TREATMENT:                                                                                                                              DATE:  Seated:  Marching 10X each (HEP review)  Long arc quad 10X5" each (HEP review)  Heelslides for AROM flexion 10X (HEP review) Supine:  bridge 10X  SLR 10X each  Heelsides 10X each Nustep UE/LE 5 minutes seat level 1 (end of session)  08/04/22 Eval      PATIENT EDUCATION:  Education details: on Eval findings, POC and HEP  Person educated: Patient Education method: Explanation Education comprehension: verbalized understanding  HOME EXERCISE PROGRAM: Access Code: [email or web address] URL: [email or web address] Date: 08/04/2022 Prepared by: Josue Hector  Exercises - Seated March  - 2-3 x daily - 7 x weekly - 2 sets - 10 reps - Seated Long Arc Quad  - 2-3 x daily - 7 x weekly - 2 sets - 10 reps - Seated Knee Flexion Extension AROM   - 2-3 x daily - 7 x weekly - 1 sets - 10 reps - 5 second hold  ASSESSMENT:  CLINICAL IMPRESSION: Patient noted to have hard time getting out of chair in waiting room at beginning of therapy. Reviewed goals and POC moving forward.  Pt able to complete/recall given HEP.  Began supine LE strengthening with cues for general form.  Finished up with nustep to work on knee ROM.  No pain or issues voiced during or at completion of session today.  Patient will continue to benefit from skilled physical therapy services to address deficits, reduce pain and improve level of function with ADLs and functional mobility tasks.   OBJECTIVE IMPAIRMENTS: Abnormal gait, decreased activity tolerance, decreased balance, decreased endurance, decreased mobility, difficulty walking, decreased ROM, decreased strength, hypomobility, increased edema, improper body mechanics, and pain.   ACTIVITY LIMITATIONS: carrying, lifting, bending, sitting, standing, squatting, sleeping, stairs, transfers, bed mobility, and locomotion level  PARTICIPATION LIMITATIONS: meal prep, cleaning, laundry, shopping, community activity, occupation, and yard work  PERSONAL FACTORS: Time since onset of injury/illness/exacerbation are also affecting patient's functional outcome.   REHAB POTENTIAL: Good  CLINICAL DECISION MAKING: Stable/uncomplicated  EVALUATION COMPLEXITY: Low  GOALS: SHORT TERM GOALS: Target date: 08/25/2022  Patient will be  independent with initial HEP and self-management strategies to improve functional outcomes Baseline:  Goal status: IN PROGRESS    LONG TERM GOALS: Target date: 09/15/2022  Patient will be independent with advanced HEP and self-management strategies to improve functional outcomes Baseline:  Goal status: IN PROGRESS  2.  Patient will improve LEFS bat least 18 points indicate improvement in functional outcomes Baseline: 22/80 Goal status: IN PROGRESS  3.  Patient will have Bilat knee AROM 0-105 degrees to improve functional mobility and facilitate squatting to pick up items from floor. Baseline: See AROM Goal status: IN PROGRESS  4. Patient will have equal to or > 4+/5 MMT throughout tested BLE to improve ability to perform functional mobility, stair ambulation and ADLs.  Baseline: See MMT Goal status: IN PROGRESS  5. Patient will be able to ambulate at least 350 feet during 2MWT with LRAD to demonstrate improved ability to perform functional mobility and associated tasks. Baseline: 290 feet using SPC  Goal status: IN PROGRESS PLAN:  PT FREQUENCY: 1-2x/week  PT DURATION: 6 weeks  PLANNED INTERVENTIONS: Therapeutic exercises, Therapeutic activity, Neuromuscular re-education, Balance training, Gait training, Patient/Family education, Joint manipulation, Joint mobilization, Stair training, Aquatic Therapy, Dry Needling, Electrical stimulation, Spinal manipulation, Spinal mobilization, Cryotherapy, Moist heat, scar mobilization, Taping, Traction, Ultrasound, Biofeedback, Ionotophoresis '4mg'$ /ml Dexamethasone, and Manual therapy.   PLAN FOR NEXT SESSION: Progress LE strengthening as tolerated. Gait and balance progressions when ready. Begin sit to stands and standing hip/LE strengthening next visit.  Update HEP as needed.     1:32 PM, 08/14/22 Teena Irani, PTA/CLT [place] Ph: [phone]

## 2022-08-19 ENCOUNTER — Telehealth: Payer: Self-pay | Admitting: Internal Medicine

## 2022-08-19 MED ORDER — BEMPEDOIC ACID 180 MG PO TABS: 1.0000 | ORAL_TABLET | Freq: Every day | ORAL | 3 refills | Status: DC

## 2022-08-19 NOTE — Telephone Encounter (Signed)
Patient notified of info below. She was able to correctly state back that she will stop rosuvastatin, wait till bone pain is gone, then start Bempidoic acid. She confirmed appt on 12/29 at 1015 with PCP.

## 2022-08-19 NOTE — Telephone Encounter (Addendum)
Pt requesting to stop taking the following medication until her next visit with her PCP on 09/18/2022 with Dr. [name] [place].  Pt believes the medication is causing her bones to "ache".  Pt is req a call back.    rosuvastatin (CRESTOR) 5 MG tablet

## 2022-08-19 NOTE — Telephone Encounter (Signed)
Fine to discontinue rosuvastatin/crestor.  Given she is having pains on such a low dose I want to have her switch to bempedoic acid which is a cholesterol drug that should not cause that side effect.  She can wait a few days until the bone pains go away then start the new medication.  Lauren I certainly would appreciate if you could relay this to Kyleeann.

## 2022-08-20 ENCOUNTER — Telehealth: Payer: Self-pay

## 2022-08-20 NOTE — Telephone Encounter (Signed)
Pa for pt  NEXLETOL ( BEMPEDOIC ACID) came through  via fax from [facility]  wa submitted  with last office notes back through [employer]... awaiting approval or denial

## 2022-08-20 NOTE — Telephone Encounter (Signed)
DECISION   APPROVED :    Decision Notes:   NEXLETOL TAB '180MG'$ , use as directed, is approved for a non-formulary exception through 07/21/2023   under your Medicare Part D benefit. Reviewed by: Antony Contras.Ph.       ( COPY SENT TO PHARMACY AND PLACE TO SCAN TO CHART ALSO )

## 2022-08-21 ENCOUNTER — Encounter (HOSPITAL_COMMUNITY): Payer: Self-pay | Admitting: Physical Therapy

## 2022-08-21 ENCOUNTER — Ambulatory Visit (HOSPITAL_COMMUNITY): Payer: 59 | Attending: Hematology and Oncology | Admitting: Physical Therapy

## 2022-08-21 ENCOUNTER — Other Ambulatory Visit: Payer: Self-pay

## 2022-08-21 DIAGNOSIS — M79605 Pain in left leg: Secondary | ICD-10-CM | POA: Diagnosis present

## 2022-08-21 DIAGNOSIS — M79604 Pain in right leg: Secondary | ICD-10-CM | POA: Diagnosis present

## 2022-08-21 MED ORDER — OXYCODONE-ACETAMINOPHEN 10-325 MG PO TABS: 1.0000 | ORAL_TABLET | Freq: Four times a day (QID) | ORAL | 0 refills | Status: DC | PRN

## 2022-08-21 NOTE — Therapy (Signed)
OUTPATIENT PHYSICAL THERAPY TREATMENT   Patient Name: Breanna Wilkins MRN: [id number] DOB:1960-08-24, 62 y.o., female Today's Date: 08/21/2022  END OF SESSION:  PT End of Session - 08/21/22 0948     Visit Number 3    Number of Visits 12    Date for PT Re-Evaluation 09/15/22    Authorization Type Medicaid    Authorization Time Period 3 visits approved 1/23-2/5    Authorization - Visit Number 2    Authorization - Number of Visits 3    Progress Note Due on Visit 4    PT Start Time [phone]    PT Stop Time 1028    PT Time Calculation (min) 40 min    Activity Tolerance Patient tolerated treatment well    Behavior During Therapy [facility] for tasks assessed/performed             Past Medical History:  Diagnosis Date   Abdominal hernia    Arthritis    Breast cancer ([place])    CAD (coronary artery disease)    Perioperative non-STEMI March, 2014  //   cardiac catheterization at time of non-STEMI, March, 2014, minor nonobstructive coronary disease, vigorous LV function, possibly vasospastic event versus stress cardiomyopathy. No further workup of coronary disease   CHF (congestive heart failure) ([place])    Diverticulosis of colon 10/17/2017   Noted on colonoscopy 10/14/17   Ejection fraction    65-70%, vigorous function, echo, March, 2011   Ejection fraction    Vigorous function at time of catheterization October 06, 2012,   GERD (gastroesophageal reflux disease)    History of colonic polyps 10/17/2017   Colonoscopy 10/14/17   Hyperlipidemia    Hypertension    Internal hemorrhoids without complication 78/58/8502   Noted on colonoscopy 10/14/17   Lung nodule 12/2009   Very small, left upper lobe by CT June, 2011, one-year followup was stable   Myocardial infarction ([place]) 09/2013   OSA (obstructive sleep apnea)    CPAP   Overweight(278.02)    Pulmonary embolus ([place]) 01/16/2010   on Coumadin indefinitely   Shortness of breath    Shoulder pain    Sleep apnea    Warfarin anticoagulation     Past Surgical History:  Procedure Laterality Date   ABDOMINAL HYSTERECTOMY  07/2002   laparoscopic assisted vaginal hysterectomy for menorrhagia, dysmenorrhea, anemia, fibroids   BREAST BIOPSY Left 09/03/2021   BREAST LUMPECTOMY     BREAST LUMPECTOMY WITH RADIOACTIVE SEED AND SENTINEL LYMPH NODE BIOPSY Left 10/03/2021   Procedure: LEFT BREAST LUMPECTOMY WITH RADIOACTIVE SEED AND SENTINEL LYMPH NODE BIOPSY;  Surgeon: [name], MD;  Location: [place];  Service: General;  Laterality: Left;   COLOSTOMY     INCISIONAL HERNIA REPAIR N/A 09/27/2012   Procedure: LAPAROSCOPIC INCISIONAL HERNIA possible open;  Surgeon: Adin Hector, MD;  Location: [place];  Service: General;  Laterality: N/A;   INSERTION OF MESH N/A 09/27/2012   Procedure: INSERTION OF MESH;  Surgeon: Adin Hector, MD;  Location: [place];  Service: General;  Laterality: N/A;   KNEE ARTHROSCOPY Left    LEFT HEART CATHETERIZATION WITH CORONARY ANGIOGRAM N/A 10/06/2012   Procedure: LEFT HEART CATHETERIZATION WITH CORONARY ANGIOGRAM;  Surgeon: Sherren Mocha, MD;  Location: [facility] CATH LAB;  Service: Cardiovascular;  Laterality: N/A;   Patient Active Problem List   Diagnosis Date Noted   Preop examination 10/02/2021   Genetic testing 09/27/2021   Family history of ovarian cancer 09/12/2021   Malignant neoplasm of upper-outer quadrant of  left breast in female, estrogen receptor positive ([place]) 09/10/2021   Neck mass 02/22/2021   Musculoskeletal neck pain 05/22/2020   Leg pain, posterior, left 12/21/2019   Strain of rectus abdominis muscle 09/06/2019   Sciatica of left side 11/10/2018   Meralgia paraesthetica 10/20/2018   Chronic fatigue 10/01/2018   Diverticulosis of colon 10/17/2017   Internal hemorrhoids without complication 09/62/8366   History of colonic polyps 10/17/2017   BRBPR (bright red blood per rectum) 08/25/2017   Chronic pain 07/10/2017   Long term (current) use of opiate analgesic 09/17/2016   Vitamin D  deficiency 09/17/2016   Primary osteoarthritis of right knee 03/04/2016   Nipple dermatitis 11/20/2015   Prediabetes 06/07/2015   Morbid obesity with BMI of 45.0-49.9, adult ([place]) 06/07/2015   GERD (gastroesophageal reflux disease) 05/29/2014   Atypical chest pain 02/23/2014   Plantar fasciitis of right foot 02/07/2014   Carpal tunnel syndrome of right wrist 12/05/2013   Pain of left calf 11/03/2013   Preventative health care 05/12/2013   CHF NYHA class II (symptoms with moderately strenuous activities) ([place]) 10/11/2012   Hyperlipidemia 08/31/2012   History of DVT (deep vein thrombosis) 03/20/2011   Long term (current) use of anticoagulants 08/09/2010   Supraspinatus tendon tear 02/11/2010   History of pulmonary embolism 01/16/2010   Severe obstructive sleep apnea 06/29/2009   Essential hypertension, benign 06/04/2009    PCP: Joni Reining DO  REFERRING PROVIDER: Joni Reining DO  REFERRING DIAG: C50.412,Z17.0 (ICD-10-CM) - Malignant neoplasm of upper-outer quadrant of left breast in female, estrogen receptor positive ([place])  THERAPY DIAG:  Pain in right leg  Pain in left leg  Rationale for Evaluation and Treatment: Rehabilitation  ONSET DATE: about 1 year   SUBJECTIVE:   SUBJECTIVE STATEMENT: Feeling a little better. Feels exercises and exercise bike have helped.   Evaluation: Patient presents to therapy with complaint of bilateral leg pain. She says she feels some of the pain may be related to her cancer treatments. She also reports history of bilateral knee OA. She had arthroscopic surgery on LT knee and has "bone on bone" on RT knee. She currently takes pain meds to manage symptoms.   PERTINENT HISTORY: Cancer, knee OA  PAIN:  Are you having pain? Yes: NPRS scale: 7/10 Pain location: bilat knee and lower legs  Pain description: stabbing, aching  Aggravating factors: standing, WB  Relieving factors: rest/ NWB and meds  PRECAUTIONS: None  WEIGHT BEARING  RESTRICTIONS: No  FALLS:  Has patient fallen in last 6 months? 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   PLOF: Independent with basic ADLs  PATIENT GOALS: Be able to walk better   NEXT MD VISIT:   OBJECTIVE:   DIAGNOSTIC FINDINGS: NA  PATIENT SURVEYS:  LEFS 17/80 (was 22/80)  COGNITION: Overall cognitive status: Within functional limits for tasks assessed    EDEMA:  See lymphedema assessment (scheduled)   LOWER EXTREMITY ROM:  Active ROM Right eval Left eval Right 08/21/22 Left 08/21/22  Hip flexion      Hip extension      Hip abduction      Hip adduction      Hip internal rotation      Hip external rotation      Knee flexion 90 100 95 100  Knee extension 0 0 0 0  Ankle dorsiflexion  Ankle plantarflexion      Ankle inversion      Ankle eversion       (Blank rows = not tested)  LOWER EXTREMITY MMT:  MMT Right eval Left eval Right 08/21/22 Left 08/21/22  Hip flexion '4 4 4 '$ 4+  Hip extension      Hip abduction      Hip adduction      Hip internal rotation      Hip external rotation      Knee flexion 4+ 4+ 4+ 4+  Knee extension 4 4 4+ 4+  Ankle dorsiflexion '5 5 5 5  '$ Ankle plantarflexion      Ankle inversion      Ankle eversion       (Blank rows = not tested)  FUNCTIONAL TESTS:  5 times sit to stand: 23.5 second with Ues (was 24.2 sec with Ues) 2 minute walk test: 305 feet with SPC (was 290 feet using SPC)   GAIT: Antalgic, decreased stride, trendelenburg  TODAY'S TREATMENT:                                                                                                                              DATE:  08/21/22 LEFS MMT Nu step seat 10 lv 2 8 minute (answering LEFS)   2 MWT   Heel raise 2 x 10 Toe raise 2 x 10 2 inch step up x10 each HHA x 2  4 inch step up x10 each HHA x  2  08/14/22 Seated:  Marching 10X each (HEP review)  Long arc quad 10X5" each (HEP review)  Heelslides for AROM flexion 10X (HEP review) Supine:  bridge 10X  SLR 10X each  Heelsides 10X each Nustep UE/LE 5 minutes seat level 1 (end of session)  08/04/22 Eval     PATIENT EDUCATION:  Education details: on Eval findings, POC and HEP  Person educated: Patient Education method: Explanation Education comprehension: verbalized understanding  HOME EXERCISE PROGRAM: Access Code: [email or web address] URL: [email or web address]  08/21/22 Access Code: [email or web address] URL: [email or web address] Date: 08/21/2022 Prepared by: Josue Hector  Exercises - Seated March  - 2-3 x daily - 7 x weekly - 2 sets - 10 reps - Seated Long Arc Quad  - 2-3 x daily - 7 x weekly - 2 sets - 10 reps - Seated Knee Flexion Extension AROM   - 2-3 x daily - 7 x weekly - 1 sets - 10 reps - 5 second hold - Heel Raises with Counter Support  - 2-3 x daily - 7 x weekly - 2 sets - 10 reps - Toe Raises with Counter Support  - 2-3 x daily - 7 x weekly - 2 sets - 10 reps  Date: 08/04/2022 Prepared by: Josue Hector  Exercises - Seated March  - 2-3 x daily - 7 x weekly - 2 sets - 10 reps - Seated Long Arc Quad  - 2-3 x daily - 7 x weekly - 2  sets - 10 reps - Seated Knee Flexion Extension AROM   - 2-3 x daily - 7 x weekly - 1 sets - 10 reps - 5 second hold  ASSESSMENT:  CLINICAL IMPRESSION: Patient showing seated objective improvement despite decreased subjective score of LEFS. Patient demos improved activity tolerance, and able to ambulate slightly farther distance using AD. Patient continues to be limited by decreased knee AROM and knee pain with WB activity, which continues to negatively impact functional ability and ADLs. Patient will continue to benefit from skilled therapy services to address remaining deficits for decreased knee pain and improved LOF with ADLs and functional mobility.  OBJECTIVE  IMPAIRMENTS: Abnormal gait, decreased activity tolerance, decreased balance, decreased endurance, decreased mobility, difficulty walking, decreased ROM, decreased strength, hypomobility, increased edema, improper body mechanics, and pain.   ACTIVITY LIMITATIONS: carrying, lifting, bending, sitting, standing, squatting, sleeping, stairs, transfers, bed mobility, and locomotion level  PARTICIPATION LIMITATIONS: meal prep, cleaning, laundry, shopping, community activity, occupation, and yard work  PERSONAL FACTORS: Time since onset of injury/illness/exacerbation are also affecting patient's functional outcome.   REHAB POTENTIAL: Good  CLINICAL DECISION MAKING: Stable/uncomplicated  EVALUATION COMPLEXITY: Low   GOALS: SHORT TERM GOALS: Target date: 08/25/2022  Patient will be independent with initial HEP and self-management strategies to improve functional outcomes Baseline:  Goal status: MET    LONG TERM GOALS: Target date: 09/15/2022  Patient will be independent with advanced HEP and self-management strategies to improve functional outcomes Baseline:  Goal status: IN PROGRESS  2.  Patient will improve LEFS bat least 18 points indicate improvement in functional outcomes Baseline: 17/80 Goal status: IN PROGRESS  3.  Patient will have Bilat knee AROM 0-105 degrees to improve functional mobility and facilitate squatting to pick up items from floor. Baseline: See AROM Goal status: IN PROGRESS  4. Patient will have equal to or > 4+/5 MMT throughout tested BLE to improve ability to perform functional mobility, stair ambulation and ADLs.  Baseline: See MMT Goal status: IN PROGRESS  5. Patient will be able to ambulate at least 350 feet during 2MWT with LRAD to demonstrate improved ability to perform functional mobility and associated tasks. Baseline: 305 feet using SPC  Goal status: IN PROGRESS PLAN:  PT FREQUENCY: 1-2x/week  PT DURATION: 6 weeks  PLANNED INTERVENTIONS:  Therapeutic exercises, Therapeutic activity, Neuromuscular re-education, Balance training, Gait training, Patient/Family education, Joint manipulation, Joint mobilization, Stair training, Aquatic Therapy, Dry Needling, Electrical stimulation, Spinal manipulation, Spinal mobilization, Cryotherapy, Moist heat, scar mobilization, Taping, Traction, Ultrasound, Biofeedback, Ionotophoresis '4mg'$ /ml Dexamethasone, and Manual therapy.   PLAN FOR NEXT SESSION: Progress LE strengthening as tolerated. Gait and balance progressions when ready. Begin sit to stands and standing hip/LE strengthening next visit. Update HEP as needed.    10:33 AM, 08/21/22 Josue Hector PT DPT  Physical Therapist with [facility]  [phone]

## 2022-08-26 ENCOUNTER — Ambulatory Visit (HOSPITAL_COMMUNITY): Payer: 59 | Admitting: Physical Therapy

## 2022-08-26 DIAGNOSIS — M79604 Pain in right leg: Secondary | ICD-10-CM

## 2022-08-26 DIAGNOSIS — M79605 Pain in left leg: Secondary | ICD-10-CM

## 2022-08-26 NOTE — Therapy (Signed)
OUTPATIENT PHYSICAL THERAPY TREATMENT   Patient Name: Breanna Wilkins MRN: [id number] DOB:1961/01/10, 62 y.o., female Today's Date: 08/26/2022  END OF SESSION:  PT End of Session - 08/26/22 0951     Visit Number 4    Number of Visits 12    Date for PT Re-Evaluation 09/15/22    Authorization Type Medicaid    Authorization Time Period 7 approved 2/6-09/22/22    Authorization - Visit Number 1    Authorization - Number of Visits 7    Progress Note Due on Visit 12    PT Start Time 0950    PT Stop Time 1324    PT Time Calculation (min) 38 min    Activity Tolerance Patient tolerated treatment well    Behavior During Therapy [facility] for tasks assessed/performed             Past Medical History:  Diagnosis Date   Abdominal hernia    Arthritis    Breast cancer ([place])    CAD (coronary artery disease)    Perioperative non-STEMI March, 2014  //   cardiac catheterization at time of non-STEMI, March, 2014, minor nonobstructive coronary disease, vigorous LV function, possibly vasospastic event versus stress cardiomyopathy. No further workup of coronary disease   CHF (congestive heart failure) ([place])    Diverticulosis of colon 10/17/2017   Noted on colonoscopy 10/14/17   Ejection fraction    65-70%, vigorous function, echo, March, 2011   Ejection fraction    Vigorous function at time of catheterization October 06, 2012,   GERD (gastroesophageal reflux disease)    History of colonic polyps 10/17/2017   Colonoscopy 10/14/17   Hyperlipidemia    Hypertension    Internal hemorrhoids without complication 40/04/2724   Noted on colonoscopy 10/14/17   Lung nodule 12/2009   Very small, left upper lobe by CT June, 2011, one-year followup was stable   Myocardial infarction ([place]) 09/2013   OSA (obstructive sleep apnea)    CPAP   Overweight(278.02)    Pulmonary embolus ([place]) 01/16/2010   on Coumadin indefinitely   Shortness of breath    Shoulder pain    Sleep apnea    Warfarin anticoagulation     Past Surgical History:  Procedure Laterality Date   ABDOMINAL HYSTERECTOMY  07/2002   laparoscopic assisted vaginal hysterectomy for menorrhagia, dysmenorrhea, anemia, fibroids   BREAST BIOPSY Left 09/03/2021   BREAST LUMPECTOMY     BREAST LUMPECTOMY WITH RADIOACTIVE SEED AND SENTINEL LYMPH NODE BIOPSY Left 10/03/2021   Procedure: LEFT BREAST LUMPECTOMY WITH RADIOACTIVE SEED AND SENTINEL LYMPH NODE BIOPSY;  Surgeon: [name], MD;  Location: [place];  Service: General;  Laterality: Left;   COLOSTOMY     INCISIONAL HERNIA REPAIR N/A 09/27/2012   Procedure: LAPAROSCOPIC INCISIONAL HERNIA possible open;  Surgeon: Adin Hector, MD;  Location: [place];  Service: General;  Laterality: N/A;   INSERTION OF MESH N/A 09/27/2012   Procedure: INSERTION OF MESH;  Surgeon: Adin Hector, MD;  Location: [place];  Service: General;  Laterality: N/A;   KNEE ARTHROSCOPY Left    LEFT HEART CATHETERIZATION WITH CORONARY ANGIOGRAM N/A 10/06/2012   Procedure: LEFT HEART CATHETERIZATION WITH CORONARY ANGIOGRAM;  Surgeon: Sherren Mocha, MD;  Location: [facility] CATH LAB;  Service: Cardiovascular;  Laterality: N/A;   Patient Active Problem List   Diagnosis Date Noted   Preop examination 10/02/2021   Genetic testing 09/27/2021   Family history of ovarian cancer 09/12/2021   Malignant neoplasm of upper-outer quadrant of left  breast in female, estrogen receptor positive ([place]) 09/10/2021   Neck mass 02/22/2021   Musculoskeletal neck pain 05/22/2020   Leg pain, posterior, left 12/21/2019   Strain of rectus abdominis muscle 09/06/2019   Sciatica of left side 11/10/2018   Meralgia paraesthetica 10/20/2018   Chronic fatigue 10/01/2018   Diverticulosis of colon 10/17/2017   Internal hemorrhoids without complication 16/55/3748   History of colonic polyps 10/17/2017   BRBPR (bright red blood per rectum) 08/25/2017   Chronic pain 07/10/2017   Long term (current) use of opiate analgesic 09/17/2016   Vitamin D  deficiency 09/17/2016   Primary osteoarthritis of right knee 03/04/2016   Nipple dermatitis 11/20/2015   Prediabetes 06/07/2015   Morbid obesity with BMI of 45.0-49.9, adult ([place]) 06/07/2015   GERD (gastroesophageal reflux disease) 05/29/2014   Atypical chest pain 02/23/2014   Plantar fasciitis of right foot 02/07/2014   Carpal tunnel syndrome of right wrist 12/05/2013   Pain of left calf 11/03/2013   Preventative health care 05/12/2013   CHF NYHA class II (symptoms with moderately strenuous activities) ([place]) 10/11/2012   Hyperlipidemia 08/31/2012   History of DVT (deep vein thrombosis) 03/20/2011   Long term (current) use of anticoagulants 08/09/2010   Supraspinatus tendon tear 02/11/2010   History of pulmonary embolism 01/16/2010   Severe obstructive sleep apnea 06/29/2009   Essential hypertension, benign 06/04/2009    PCP: Joni Reining DO  REFERRING PROVIDER: Joni Reining DO  REFERRING DIAG: C50.412,Z17.0 (ICD-10-CM) - Malignant neoplasm of upper-outer quadrant of left breast in female, estrogen receptor positive ([place])  THERAPY DIAG:  Pain in right leg  Pain in left leg  Rationale for Evaluation and Treatment: Rehabilitation  ONSET DATE: about 1 year   SUBJECTIVE:   SUBJECTIVE STATEMENT: A little more sore today. Tried bike at home which may be contributing to increased soreness. Not sure how much she should be using this at home.  Evaluation: Patient presents to therapy with complaint of bilateral leg pain. She says she feels some of the pain may be related to her cancer treatments. She also reports history of bilateral knee OA. She had arthroscopic surgery on LT knee and has "bone on bone" on RT knee. She currently takes pain meds to manage symptoms.   PERTINENT HISTORY: Cancer, knee OA  PAIN:  Are you having pain? Yes: NPRS scale: 8/10 Pain location: bilat knee and lower legs  Pain description: stabbing, aching  Aggravating factors: standing, WB  Relieving  factors: rest/ NWB and meds  PRECAUTIONS: None  WEIGHT BEARING RESTRICTIONS: No  FALLS:  Has patient fallen in last 6 months? 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   PLOF: Independent with basic ADLs  PATIENT GOALS: Be able to walk better   NEXT MD VISIT:   OBJECTIVE:   DIAGNOSTIC FINDINGS: NA  PATIENT SURVEYS:  LEFS 17/80 (was 22/80)  COGNITION: Overall cognitive status: Within functional limits for tasks assessed    EDEMA:  See lymphedema assessment (scheduled)   LOWER EXTREMITY ROM:  Active ROM Right eval Left eval Right 08/21/22 Left 08/21/22  Hip flexion      Hip extension      Hip abduction      Hip adduction      Hip internal rotation      Hip external rotation      Knee flexion 90 100 95 100  Knee extension 0 0 0 0  Ankle dorsiflexion      Ankle plantarflexion      Ankle inversion      Ankle eversion       (Blank rows = not tested)  LOWER EXTREMITY MMT:  MMT Right eval Left eval Right 08/21/22 Left 08/21/22  Hip flexion '4 4 4 '$ 4+  Hip extension      Hip abduction      Hip adduction      Hip internal rotation      Hip external rotation      Knee flexion 4+ 4+ 4+ 4+  Knee extension 4 4 4+ 4+  Ankle dorsiflexion '5 5 5 5  '$ Ankle plantarflexion      Ankle inversion      Ankle eversion       (Blank rows = not tested)  FUNCTIONAL TESTS:  5 times sit to stand: 23.5 second with Ues (was 24.2 sec with Ues) 2 minute walk test: 305 feet with SPC (was 290 feet using SPC)   GAIT: Antalgic, decreased stride, trendelenburg  TODAY'S TREATMENT:                                                                                                                              DATE:  08/26/22 Rec bike (seat 18) 4 min   Heel raise 2 x 10 Toe raise 2 x 10 4 inch step up x10 each HHA x 2 Standing hip  abduction RTB 3 x 10 Standing hip extension RTB 3 x 10  Sit to stand from elevated seat (added blue foam pad) 2 x 5  LAQ 5 lb 2 x 10 each   Nu step seat 11 lv 3 5 minute  08/21/22 LEFS MMT Nu step seat 10 lv 2 8 minute (answering LEFS)   2 MWT   Heel raise 2 x 10 Toe raise 2 x 10 2 inch step up x10 each HHA x 2  4 inch step up x10 each HHA x 2  08/14/22 Seated:  Marching 10X each (HEP review)  Long arc quad 10X5" each (HEP review)  Heelslides for AROM flexion 10X (HEP review) Supine:  bridge 10X  SLR 10X each  Heelsides 10X each Nustep UE/LE 5 minutes seat level 1 (end of session)  08/04/22 Eval     PATIENT EDUCATION:  Education details: on Eval findings, POC and HEP  Person educated: Patient Education method: Explanation Education comprehension: verbalized understanding  HOME EXERCISE PROGRAM: Access Code: [email or web address] URL: [email or web address]  08/26/22 - Standing Hip Abduction with Resistance at Ankles and Counter Support  - 1-2 x daily - 7 x weekly - 2 sets - 10 reps - Standing Hip Extension with Resistance at Ankles and Counter Support  - 1-2 x daily - 7 x weekly - 2 sets - 10 reps - Sit to Stand with Counter Support  - 1-2 x daily - 7 x weekly - 2 sets - 10  reps   08/21/22 Access Code: [email or web address] URL: [email or web address] Date: 08/21/2022 Prepared by: Josue Hector  Exercises - Seated March  - 2-3 x daily - 7 x weekly - 2 sets - 10 reps - Seated Long Arc Quad  - 2-3 x daily - 7 x weekly - 2 sets - 10 reps - Seated Knee Flexion Extension AROM   - 2-3 x daily - 7 x weekly - 1 sets - 10 reps - 5 second hold - Heel Raises with Counter Support  - 2-3 x daily - 7 x weekly - 2 sets - 10 reps - Toe Raises with Counter Support  - 2-3 x daily - 7 x weekly - 2 sets - 10 reps  Date: 08/04/2022 Prepared by: Josue Hector  Exercises - Seated March  - 2-3 x daily - 7 x weekly - 2 sets - 10 reps - Seated Long Arc Quad  - 2-3 x daily - 7 x  weekly - 2 sets - 10 reps - Seated Knee Flexion Extension AROM   - 2-3 x daily - 7 x weekly - 1 sets - 10 reps - 5 second hold  ASSESSMENT:  CLINICAL IMPRESSION: Patient tolerated session well today. Discussed appropriate activity progression at home with walking and trying stationary bike. Able to progress LE strengthening with added resistance to standing hip abduction and extension, as well as weighted LAQ. Patient noting slight discomfort in RT knee with sit to stands. Elevated seat height with improved results and decreased knee pain. Patient continues to be limited by weakness which is negatively impacting functional mobility and transfers. Patient will continue to benefit from skilled therapy services to reduce remaining deficits and improve functional ability.    OBJECTIVE IMPAIRMENTS: Abnormal gait, decreased activity tolerance, decreased balance, decreased endurance, decreased mobility, difficulty walking, decreased ROM, decreased strength, hypomobility, increased edema, improper body mechanics, and pain.   ACTIVITY LIMITATIONS: carrying, lifting, bending, sitting, standing, squatting, sleeping, stairs, transfers, bed mobility, and locomotion level  PARTICIPATION LIMITATIONS: meal prep, cleaning, laundry, shopping, community activity, occupation, and yard work  PERSONAL FACTORS: Time since onset of injury/illness/exacerbation are also affecting patient's functional outcome.   REHAB POTENTIAL: Good  CLINICAL DECISION MAKING: Stable/uncomplicated  EVALUATION COMPLEXITY: Low   GOALS: SHORT TERM GOALS: Target date: 08/25/2022  Patient will be independent with initial HEP and self-management strategies to improve functional outcomes Baseline:  Goal status: MET    LONG TERM GOALS: Target date: 09/15/2022  Patient will be independent with advanced HEP and self-management strategies to improve functional outcomes Baseline:  Goal status: IN PROGRESS  2.  Patient will improve LEFS  bat least 18 points indicate improvement in functional outcomes Baseline: 17/80 Goal status: IN PROGRESS  3.  Patient will have Bilat knee AROM 0-105 degrees to improve functional mobility and facilitate squatting to pick up items from floor. Baseline: See AROM Goal status: IN PROGRESS  4. Patient will have equal to or > 4+/5 MMT throughout tested BLE to improve ability to perform functional mobility, stair ambulation and ADLs.  Baseline: See MMT Goal status: IN PROGRESS  5. Patient will be able to ambulate at least 350 feet during 2MWT with LRAD to demonstrate improved ability to perform functional mobility and associated tasks. Baseline: 305 feet using SPC  Goal status: IN PROGRESS PLAN:  PT FREQUENCY: 1-2x/week  PT DURATION: 6 weeks  PLANNED INTERVENTIONS: Therapeutic exercises, Therapeutic activity, Neuromuscular re-education, Balance training, Gait training, Patient/Family education, Joint manipulation, Joint mobilization, Stair training, Aquatic Therapy, Dry Needling, Electrical  stimulation, Spinal manipulation, Spinal mobilization, Cryotherapy, Moist heat, scar mobilization, Taping, Traction, Ultrasound, Biofeedback, Ionotophoresis '4mg'$ /ml Dexamethasone, and Manual therapy.   PLAN FOR NEXT SESSION: Progress LE strengthening as tolerated. Gait and balance progressions as able.   10:33 AM, 08/26/22 Josue Hector PT DPT  Physical Therapist with [facility]  [phone]

## 2022-09-01 ENCOUNTER — Ambulatory Visit (HOSPITAL_COMMUNITY): Payer: 59 | Admitting: Physical Therapy

## 2022-09-01 DIAGNOSIS — M79604 Pain in right leg: Secondary | ICD-10-CM | POA: Diagnosis not present

## 2022-09-01 DIAGNOSIS — M79605 Pain in left leg: Secondary | ICD-10-CM

## 2022-09-01 NOTE — Therapy (Signed)
OUTPATIENT PHYSICAL THERAPY TREATMENT   Patient Name: Breanna Wilkins MRN: [id number] DOB:1960-09-08, 62 y.o., female Today's Date: 09/01/2022  END OF SESSION:  PT End of Session - 09/01/22 0939     Visit Number 5    Number of Visits 12    Date for PT Re-Evaluation 09/15/22    Authorization Type Medicaid    Authorization Time Period 7 approved 2/6-09/22/22    Authorization - Visit Number 2    Authorization - Number of Visits 7    Progress Note Due on Visit 12    PT Start Time [phone]    PT Stop Time [id number]    PT Time Calculation (min) 40 min    Activity Tolerance Patient tolerated treatment well    Behavior During Therapy [facility] for tasks assessed/performed             Past Medical History:  Diagnosis Date   Abdominal hernia    Arthritis    Breast cancer ([place])    CAD (coronary artery disease)    Perioperative non-STEMI March, 2014  //   cardiac catheterization at time of non-STEMI, March, 2014, minor nonobstructive coronary disease, vigorous LV function, possibly vasospastic event versus stress cardiomyopathy. No further workup of coronary disease   CHF (congestive heart failure) ([place])    Diverticulosis of colon 10/17/2017   Noted on colonoscopy 10/14/17   Ejection fraction    65-70%, vigorous function, echo, March, 2011   Ejection fraction    Vigorous function at time of catheterization October 06, 2012,   GERD (gastroesophageal reflux disease)    History of colonic polyps 10/17/2017   Colonoscopy 10/14/17   Hyperlipidemia    Hypertension    Internal hemorrhoids without complication [id number]   Noted on colonoscopy 10/14/17   Lung nodule 12/2009   Very small, left upper lobe by CT June, 2011, one-year followup was stable   Myocardial infarction ([place]) 09/2013   OSA (obstructive sleep apnea)    CPAP   Overweight(278.02)    Pulmonary embolus ([place]) 01/16/2010   on Coumadin indefinitely   Shortness of breath    Shoulder pain    Sleep apnea    Warfarin anticoagulation     Past Surgical History:  Procedure Laterality Date   ABDOMINAL HYSTERECTOMY  07/2002   laparoscopic assisted vaginal hysterectomy for menorrhagia, dysmenorrhea, anemia, fibroids   BREAST BIOPSY Left 09/03/2021   BREAST LUMPECTOMY     BREAST LUMPECTOMY WITH RADIOACTIVE SEED AND SENTINEL LYMPH NODE BIOPSY Left 10/03/2021   Procedure: LEFT BREAST LUMPECTOMY WITH RADIOACTIVE SEED AND SENTINEL LYMPH NODE BIOPSY;  Surgeon: [name], MD;  Location: [place];  Service: General;  Laterality: Left;   COLOSTOMY     INCISIONAL HERNIA REPAIR N/A 09/27/2012   Procedure: LAPAROSCOPIC INCISIONAL HERNIA possible open;  Surgeon: Adin Hector, MD;  Location: [place];  Service: General;  Laterality: N/A;   INSERTION OF MESH N/A 09/27/2012   Procedure: INSERTION OF MESH;  Surgeon: Adin Hector, MD;  Location: [place];  Service: General;  Laterality: N/A;   KNEE ARTHROSCOPY Left    LEFT HEART CATHETERIZATION WITH CORONARY ANGIOGRAM N/A 10/06/2012   Procedure: LEFT HEART CATHETERIZATION WITH CORONARY ANGIOGRAM;  Surgeon: Sherren Mocha, MD;  Location: [facility] CATH LAB;  Service: Cardiovascular;  Laterality: N/A;   Patient Active Problem List   Diagnosis Date Noted   Preop examination 10/02/2021   Genetic testing 09/27/2021   Family history of ovarian cancer 09/12/2021   Malignant neoplasm of upper-outer quadrant of left  breast in female, estrogen receptor positive ([place]) 09/10/2021   Neck mass 02/22/2021   Musculoskeletal neck pain 05/22/2020   Leg pain, posterior, left 12/21/2019   Strain of rectus abdominis muscle 09/06/2019   Sciatica of left side 11/10/2018   Meralgia paraesthetica 10/20/2018   Chronic fatigue 10/01/2018   Diverticulosis of colon 10/17/2017   Internal hemorrhoids without complication 123XX123   History of colonic polyps 10/17/2017   BRBPR (bright red blood per rectum) 08/25/2017   Chronic pain 07/10/2017   Long term (current) use of opiate analgesic 09/17/2016   Vitamin D  deficiency 09/17/2016   Primary osteoarthritis of right knee 03/04/2016   Nipple dermatitis 11/20/2015   Prediabetes 06/07/2015   Morbid obesity with BMI of 45.0-49.9, adult ([place]) 06/07/2015   GERD (gastroesophageal reflux disease) 05/29/2014   Atypical chest pain 02/23/2014   Plantar fasciitis of right foot 02/07/2014   Carpal tunnel syndrome of right wrist 12/05/2013   Pain of left calf 11/03/2013   Preventative health care 05/12/2013   CHF NYHA class II (symptoms with moderately strenuous activities) ([place]) 10/11/2012   Hyperlipidemia 08/31/2012   History of DVT (deep vein thrombosis) 03/20/2011   Long term (current) use of anticoagulants 08/09/2010   Supraspinatus tendon tear 02/11/2010   History of pulmonary embolism 01/16/2010   Severe obstructive sleep apnea 06/29/2009   Essential hypertension, benign 06/04/2009    PCP: Joni Reining DO  REFERRING PROVIDER: Joni Reining DO  REFERRING DIAG: C50.412,Z17.0 (ICD-10-CM) - Malignant neoplasm of upper-outer quadrant of left breast in female, estrogen receptor positive ([place])  THERAPY DIAG:  Pain in right leg  Pain in left leg  Rationale for Evaluation and Treatment: Rehabilitation  ONSET DATE: about 1 year   SUBJECTIVE:   SUBJECTIVE STATEMENT: Legs are hurting now. Feels maybe a little stronger.   Evaluation: Patient presents to therapy with complaint of bilateral leg pain. She says she feels some of the pain may be related to her cancer treatments. She also reports history of bilateral knee OA. She had arthroscopic surgery on LT knee and has "bone on bone" on RT knee. She currently takes pain meds to manage symptoms.   PERTINENT HISTORY: Cancer, knee OA  PAIN:  Are you having pain? Yes: NPRS scale: 9/10 Pain location: bilat knee and lower legs  Pain description: stabbing, aching  Aggravating factors: standing, WB  Relieving factors: rest/ NWB and meds  PRECAUTIONS: None  WEIGHT BEARING RESTRICTIONS: No  FALLS:   Has patient fallen in last 6 months? 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   PLOF: Independent with basic ADLs  PATIENT GOALS: Be able to walk better   NEXT MD VISIT:   OBJECTIVE:   DIAGNOSTIC FINDINGS: NA  PATIENT SURVEYS:  LEFS 17/80 (was 22/80)  COGNITION: Overall cognitive status: Within functional limits for tasks assessed    EDEMA:  See lymphedema assessment (scheduled)   LOWER EXTREMITY ROM:  Active ROM Right eval Left eval Right 08/21/22 Left 08/21/22  Hip flexion      Hip extension      Hip abduction      Hip adduction      Hip internal rotation      Hip external rotation      Knee flexion 90 100 95 100  Knee extension 0 0 0 0  Ankle dorsiflexion      Ankle plantarflexion  Ankle inversion      Ankle eversion       (Blank rows = not tested)  LOWER EXTREMITY MMT:  MMT Right eval Left eval Right 08/21/22 Left 08/21/22  Hip flexion 4 4 4 $ 4+  Hip extension      Hip abduction      Hip adduction      Hip internal rotation      Hip external rotation      Knee flexion 4+ 4+ 4+ 4+  Knee extension 4 4 4+ 4+  Ankle dorsiflexion 5 5 5 5  $ Ankle plantarflexion      Ankle inversion      Ankle eversion       (Blank rows = not tested)  FUNCTIONAL TESTS:  5 times sit to stand: 23.5 second with Ues (was 24.2 sec with Ues) 2 minute walk test: 305 feet with SPC (was 290 feet using SPC)   GAIT: Antalgic, decreased stride, trendelenburg  TODAY'S TREATMENT:                                                                                                                              DATE:  09/01/22 Nu step seat 10 lv 3 8 minute  Slant board 3 x 30" Heel raise 2 x 10 Toe raise 2 x 10 4 inch step up x10 each HHA x 2 Standing hip abduction GTB 2 x 10 Standing hip extension GTB 2 x 10  Sit to  stand from elevated seat (added blue foam pad) 2 x 10  08/26/22 Rec bike (seat 18) 4 min   Heel raise 2 x 10 Toe raise 2 x 10 4 inch step up x10 each HHA x 2 Standing hip abduction RTB 3 x 10 Standing hip extension RTB 3 x 10  Sit to stand from elevated seat (added blue foam pad) 2 x 5  LAQ 5 lb 2 x 10 each   Nu step seat 11 lv 3 5 minute  08/21/22 LEFS MMT Nu step seat 10 lv 2 8 minute (answering LEFS)   2 MWT   Heel raise 2 x 10 Toe raise 2 x 10 2 inch step up x10 each HHA x 2  4 inch step up x10 each HHA x 2  08/14/22 Seated:  Marching 10X each (HEP review)  Long arc quad 10X5" each (HEP review)  Heelslides for AROM flexion 10X (HEP review) Supine:  bridge 10X  SLR 10X each  Heelsides 10X each Nustep UE/LE 5 minutes seat level 1 (end of session)  08/04/22 Eval     PATIENT EDUCATION:  Education details: on Eval findings, POC and HEP  Person educated: Patient Education method: Explanation Education comprehension: verbalized understanding  HOME EXERCISE PROGRAM: Access Code: [email or web address] URL: [email or web address]  08/26/22 - Standing Hip Abduction with Resistance at Ankles and Counter Support  - 1-2 x daily - 7 x weekly - 2 sets - 10 reps - Standing Hip Extension with  Resistance at Ankles and Counter Support  - 1-2 x daily - 7 x weekly - 2 sets - 10 reps - Sit to Stand with Counter Support  - 1-2 x daily - 7 x weekly - 2 sets - 10 reps   08/21/22 Access Code: [email or web address] URL: [email or web address] Date: 08/21/2022 Prepared by: Josue Hector  Exercises - Seated March  - 2-3 x daily - 7 x weekly - 2 sets - 10 reps - Seated Long Arc Quad  - 2-3 x daily - 7 x weekly - 2 sets - 10 reps - Seated Knee Flexion Extension AROM   - 2-3 x daily - 7 x weekly - 1 sets - 10 reps - 5 second hold - Heel Raises with Counter Support  - 2-3 x daily - 7 x weekly - 2 sets - 10 reps - Toe Raises with Counter Support  - 2-3 x daily - 7 x weekly - 2 sets - 10  reps  Date: 08/04/2022 Prepared by: Josue Hector  Exercises - Seated March  - 2-3 x daily - 7 x weekly - 2 sets - 10 reps - Seated Long Arc Quad  - 2-3 x daily - 7 x weekly - 2 sets - 10 reps - Seated Knee Flexion Extension AROM   - 2-3 x daily - 7 x weekly - 1 sets - 10 reps - 5 second hold  ASSESSMENT:  CLINICAL IMPRESSION: Patient with ongoing knee pain with WB. Able to increase reps with sit to stands. Corrected foot position for improved mechanics and decreased knee pain. Also discussed benefits of aquatic therapy/ walking program in pool for LE strength/ decreased knee pain. Increased band resistance to green. Patient will continue to benefit from skilled therapy services to reduce remaining deficits and improve functional ability.    OBJECTIVE IMPAIRMENTS: Abnormal gait, decreased activity tolerance, decreased balance, decreased endurance, decreased mobility, difficulty walking, decreased ROM, decreased strength, hypomobility, increased edema, improper body mechanics, and pain.   ACTIVITY LIMITATIONS: carrying, lifting, bending, sitting, standing, squatting, sleeping, stairs, transfers, bed mobility, and locomotion level  PARTICIPATION LIMITATIONS: meal prep, cleaning, laundry, shopping, community activity, occupation, and yard work  PERSONAL FACTORS: Time since onset of injury/illness/exacerbation are also affecting patient's functional outcome.   REHAB POTENTIAL: Good  CLINICAL DECISION MAKING: Stable/uncomplicated  EVALUATION COMPLEXITY: Low   GOALS: SHORT TERM GOALS: Target date: 08/25/2022  Patient will be independent with initial HEP and self-management strategies to improve functional outcomes Baseline:  Goal status: MET    LONG TERM GOALS: Target date: 09/15/2022  Patient will be independent with advanced HEP and self-management strategies to improve functional outcomes Baseline:  Goal status: IN PROGRESS  2.  Patient will improve LEFS bat least 18 points  indicate improvement in functional outcomes Baseline: 17/80 Goal status: IN PROGRESS  3.  Patient will have Bilat knee AROM 0-105 degrees to improve functional mobility and facilitate squatting to pick up items from floor. Baseline: See AROM Goal status: IN PROGRESS  4. Patient will have equal to or > 4+/5 MMT throughout tested BLE to improve ability to perform functional mobility, stair ambulation and ADLs.  Baseline: See MMT Goal status: IN PROGRESS  5. Patient will be able to ambulate at least 350 feet during 2MWT with LRAD to demonstrate improved ability to perform functional mobility and associated tasks. Baseline: 305 feet using SPC  Goal status: IN PROGRESS PLAN:  PT FREQUENCY: 1-2x/week  PT DURATION: 6 weeks  PLANNED INTERVENTIONS: Therapeutic exercises, Therapeutic activity, Neuromuscular re-education, Balance  training, Gait training, Patient/Family education, Joint manipulation, Joint mobilization, Stair training, Aquatic Therapy, Dry Needling, Electrical stimulation, Spinal manipulation, Spinal mobilization, Cryotherapy, Moist heat, scar mobilization, Taping, Traction, Ultrasound, Biofeedback, Ionotophoresis 64m/ml Dexamethasone, and Manual therapy.   PLAN FOR NEXT SESSION: Progress LE strengthening as tolerated. Gait and balance progressions as able.   10:26 AM, 09/01/22 CJosue HectorPT DPT  Physical Therapist [facility] [phone]

## 2022-09-03 ENCOUNTER — Encounter (HOSPITAL_COMMUNITY): Payer: Medicaid Other | Admitting: Physical Therapy

## 2022-09-08 ENCOUNTER — Ambulatory Visit (HOSPITAL_COMMUNITY): Payer: 59 | Admitting: Physical Therapy

## 2022-09-08 DIAGNOSIS — M79604 Pain in right leg: Secondary | ICD-10-CM | POA: Diagnosis not present

## 2022-09-08 DIAGNOSIS — M79605 Pain in left leg: Secondary | ICD-10-CM

## 2022-09-08 NOTE — Therapy (Signed)
OUTPATIENT PHYSICAL THERAPY TREATMENT   Patient Name: Breanna Wilkins MRN: [id number] DOB:1961-06-07, 62 y.o., female Today's Date: 09/08/2022  END OF SESSION:  PT End of Session - 09/08/22 1028     Visit Number 6    Number of Visits 12    Date for PT Re-Evaluation 09/15/22    Authorization Type Medicaid    Authorization Time Period 7 approved 2/6-09/22/22    Authorization - Visit Number 3    Authorization - Number of Visits 7    Progress Note Due on Visit 12    PT Start Time 1026    PT Stop Time 1105    PT Time Calculation (min) 39 min    Activity Tolerance Patient tolerated treatment well    Behavior During Therapy [facility] for tasks assessed/performed             Past Medical History:  Diagnosis Date   Abdominal hernia    Arthritis    Breast cancer ([place])    CAD (coronary artery disease)    Perioperative non-STEMI March, 2014  //   cardiac catheterization at time of non-STEMI, March, 2014, minor nonobstructive coronary disease, vigorous LV function, possibly vasospastic event versus stress cardiomyopathy. No further workup of coronary disease   CHF (congestive heart failure) ([place])    Diverticulosis of colon 10/17/2017   Noted on colonoscopy 10/14/17   Ejection fraction    65-70%, vigorous function, echo, March, 2011   Ejection fraction    Vigorous function at time of catheterization October 06, 2012,   GERD (gastroesophageal reflux disease)    History of colonic polyps 10/17/2017   Colonoscopy 10/14/17   Hyperlipidemia    Hypertension    Internal hemorrhoids without complication [id number]   Noted on colonoscopy 10/14/17   Lung nodule 12/2009   Very small, left upper lobe by CT June, 2011, one-year followup was stable   Myocardial infarction ([place]) 09/2013   OSA (obstructive sleep apnea)    CPAP   Overweight(278.02)    Pulmonary embolus ([place]) 01/16/2010   on Coumadin indefinitely   Shortness of breath    Shoulder pain    Sleep apnea    Warfarin anticoagulation     Past Surgical History:  Procedure Laterality Date   ABDOMINAL HYSTERECTOMY  07/2002   laparoscopic assisted vaginal hysterectomy for menorrhagia, dysmenorrhea, anemia, fibroids   BREAST BIOPSY Left 09/03/2021   BREAST LUMPECTOMY     BREAST LUMPECTOMY WITH RADIOACTIVE SEED AND SENTINEL LYMPH NODE BIOPSY Left 10/03/2021   Procedure: LEFT BREAST LUMPECTOMY WITH RADIOACTIVE SEED AND SENTINEL LYMPH NODE BIOPSY;  Surgeon: [name], MD;  Location: [place];  Service: General;  Laterality: Left;   COLOSTOMY     INCISIONAL HERNIA REPAIR N/A 09/27/2012   Procedure: LAPAROSCOPIC INCISIONAL HERNIA possible open;  Surgeon: Adin Hector, MD;  Location: [place];  Service: General;  Laterality: N/A;   INSERTION OF MESH N/A 09/27/2012   Procedure: INSERTION OF MESH;  Surgeon: Adin Hector, MD;  Location: [place];  Service: General;  Laterality: N/A;   KNEE ARTHROSCOPY Left    LEFT HEART CATHETERIZATION WITH CORONARY ANGIOGRAM N/A 10/06/2012   Procedure: LEFT HEART CATHETERIZATION WITH CORONARY ANGIOGRAM;  Surgeon: Sherren Mocha, MD;  Location: [facility] CATH LAB;  Service: Cardiovascular;  Laterality: N/A;   Patient Active Problem List   Diagnosis Date Noted   Preop examination 10/02/2021   Genetic testing 09/27/2021   Family history of ovarian cancer 09/12/2021   Malignant neoplasm of upper-outer quadrant of left  breast in female, estrogen receptor positive ([place]) 09/10/2021   Neck mass 02/22/2021   Musculoskeletal neck pain 05/22/2020   Leg pain, posterior, left 12/21/2019   Strain of rectus abdominis muscle 09/06/2019   Sciatica of left side 11/10/2018   Meralgia paraesthetica 10/20/2018   Chronic fatigue 10/01/2018   Diverticulosis of colon 10/17/2017   Internal hemorrhoids without complication 123XX123   History of colonic polyps 10/17/2017   BRBPR (bright red blood per rectum) 08/25/2017   Chronic pain 07/10/2017   Long term (current) use of opiate analgesic 09/17/2016   Vitamin D  deficiency 09/17/2016   Primary osteoarthritis of right knee 03/04/2016   Nipple dermatitis 11/20/2015   Prediabetes 06/07/2015   Morbid obesity with BMI of 45.0-49.9, adult ([place]) 06/07/2015   GERD (gastroesophageal reflux disease) 05/29/2014   Atypical chest pain 02/23/2014   Plantar fasciitis of right foot 02/07/2014   Carpal tunnel syndrome of right wrist 12/05/2013   Pain of left calf 11/03/2013   Preventative health care 05/12/2013   CHF NYHA class II (symptoms with moderately strenuous activities) ([place]) 10/11/2012   Hyperlipidemia 08/31/2012   History of DVT (deep vein thrombosis) 03/20/2011   Long term (current) use of anticoagulants 08/09/2010   Supraspinatus tendon tear 02/11/2010   History of pulmonary embolism 01/16/2010   Severe obstructive sleep apnea 06/29/2009   Essential hypertension, benign 06/04/2009    PCP: Joni Reining DO  REFERRING PROVIDER: Joni Reining DO  REFERRING DIAG: C50.412,Z17.0 (ICD-10-CM) - Malignant neoplasm of upper-outer quadrant of left breast in female, estrogen receptor positive ([place])  THERAPY DIAG:  Pain in right leg  Pain in left leg  Rationale for Evaluation and Treatment: Rehabilitation  ONSET DATE: about 1 year   SUBJECTIVE:   SUBJECTIVE STATEMENT: Legs continue to hurt. Feels it is related to the weather.   Evaluation: Patient presents to therapy with complaint of bilateral leg pain. She says she feels some of the pain may be related to her cancer treatments. She also reports history of bilateral knee OA. She had arthroscopic surgery on LT knee and has "bone on bone" on RT knee. She currently takes pain meds to manage symptoms.   PERTINENT HISTORY: Cancer, knee OA  PAIN:  Are you having pain? Yes: NPRS scale: 8/10 Pain location: bilat knee and lower legs  Pain description: stabbing, aching  Aggravating factors: standing, WB  Relieving factors: rest/ NWB and meds  PRECAUTIONS: None  WEIGHT BEARING RESTRICTIONS:  No  FALLS:  Has patient fallen in last 6 months? 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   PLOF: Independent with basic ADLs  PATIENT GOALS: Be able to walk better   NEXT MD VISIT:   OBJECTIVE:   DIAGNOSTIC FINDINGS: NA  PATIENT SURVEYS:  LEFS 17/80 (was 22/80)  COGNITION: Overall cognitive status: Within functional limits for tasks assessed    EDEMA:  See lymphedema assessment (scheduled)   LOWER EXTREMITY ROM:  Active ROM Right eval Left eval Right 08/21/22 Left 08/21/22  Hip flexion      Hip extension      Hip abduction      Hip adduction      Hip internal rotation      Hip external rotation      Knee flexion 90 100 95 100  Knee extension 0 0 0 0  Ankle dorsiflexion  Ankle plantarflexion      Ankle inversion      Ankle eversion       (Blank rows = not tested)  LOWER EXTREMITY MMT:  MMT Right eval Left eval Right 08/21/22 Left 08/21/22  Hip flexion 4 4 4 $ 4+  Hip extension      Hip abduction      Hip adduction      Hip internal rotation      Hip external rotation      Knee flexion 4+ 4+ 4+ 4+  Knee extension 4 4 4+ 4+  Ankle dorsiflexion 5 5 5 5  $ Ankle plantarflexion      Ankle inversion      Ankle eversion       (Blank rows = not tested)  FUNCTIONAL TESTS:  5 times sit to stand: 23.5 second with Ues (was 24.2 sec with Ues) 2 minute walk test: 305 feet with SPC (was 290 feet using SPC)   GAIT: Antalgic, decreased stride, trendelenburg  TODAY'S TREATMENT:                                                                                                                              DATE:  09/08/22 Nu step seat 10 lv 3 8 minute  Heel raise 2 x 10 Toe raise 2 x 10 4 inch step up x10 each HHA x 2 (tried 6 inch, increased pain)  Standing hip abduction 5lb 2 x 10 each  Standing hip  extension 5lb 2 x 10 each  Standing knee flexion 5lb 2 x 10 each  TKE 3 plate x 20 each   LAQ 5lb x20 each   09/01/22 Nu step seat 10 lv 3 8 minute  Slant board 3 x 30" Heel raise 2 x 10 Toe raise 2 x 10 4 inch step up x10 each HHA x 2 Standing hip abduction GTB 2 x 10 Standing hip extension GTB 2 x 10  Sit to stand from elevated seat (added blue foam pad) 2 x 10  08/26/22 Rec bike (seat 18) 4 min   Heel raise 2 x 10 Toe raise 2 x 10 4 inch step up x10 each HHA x 2 Standing hip abduction RTB 3 x 10 Standing hip extension RTB 3 x 10  Sit to stand from elevated seat (added blue foam pad) 2 x 5  LAQ 5 lb 2 x 10 each   Nu step seat 11 lv 3 5 minute    PATIENT EDUCATION:  Education details: on Eval findings, POC and HEP  Person educated: Patient Education method: Explanation Education comprehension: verbalized understanding  HOME EXERCISE PROGRAM: Access Code: [email or web address] URL: [email or web address]  08/26/22 - Standing Hip Abduction with Resistance at Ankles and Counter Support  - 1-2 x daily - 7 x weekly - 2 sets - 10 reps - Standing Hip Extension with Resistance at Ankles and Counter Support  - 1-2 x daily - 7 x weekly - 2  sets - 10 reps - Sit to Stand with Counter Support  - 1-2 x daily - 7 x weekly - 2 sets - 10 reps   08/21/22 Access Code: [email or web address] URL: [email or web address] Date: 08/21/2022 Prepared by: Josue Hector  Exercises - Seated March  - 2-3 x daily - 7 x weekly - 2 sets - 10 reps - Seated Long Arc Quad  - 2-3 x daily - 7 x weekly - 2 sets - 10 reps - Seated Knee Flexion Extension AROM   - 2-3 x daily - 7 x weekly - 1 sets - 10 reps - 5 second hold - Heel Raises with Counter Support  - 2-3 x daily - 7 x weekly - 2 sets - 10 reps - Toe Raises with Counter Support  - 2-3 x daily - 7 x weekly - 2 sets - 10 reps  Date: 08/04/2022 Prepared by: Josue Hector  Exercises - Seated March  - 2-3 x daily - 7 x weekly - 2 sets - 10  reps - Seated Long Arc Quad  - 2-3 x daily - 7 x weekly - 2 sets - 10 reps - Seated Knee Flexion Extension AROM   - 2-3 x daily - 7 x weekly - 1 sets - 10 reps - 5 second hold  ASSESSMENT:  CLINICAL IMPRESSION:  Patient with increased complaints of knee pain today. Graded activity per patient tolerance and monitored during session. Continued working on knee and hip strengthening exercise to tolerance. Patient continues to be limited by functional weakness and knee pain which is negatively impacting function. Patient will continue to benefit from skilled therapy services to reduce remaining deficits and improve functional ability.    OBJECTIVE IMPAIRMENTS: Abnormal gait, decreased activity tolerance, decreased balance, decreased endurance, decreased mobility, difficulty walking, decreased ROM, decreased strength, hypomobility, increased edema, improper body mechanics, and pain.   ACTIVITY LIMITATIONS: carrying, lifting, bending, sitting, standing, squatting, sleeping, stairs, transfers, bed mobility, and locomotion level  PARTICIPATION LIMITATIONS: meal prep, cleaning, laundry, shopping, community activity, occupation, and yard work  PERSONAL FACTORS: Time since onset of injury/illness/exacerbation are also affecting patient's functional outcome.   REHAB POTENTIAL: Good  CLINICAL DECISION MAKING: Stable/uncomplicated  EVALUATION COMPLEXITY: Low   GOALS: SHORT TERM GOALS: Target date: 08/25/2022  Patient will be independent with initial HEP and self-management strategies to improve functional outcomes Baseline:  Goal status: MET    LONG TERM GOALS: Target date: 09/15/2022  Patient will be independent with advanced HEP and self-management strategies to improve functional outcomes Baseline:  Goal status: IN PROGRESS  2.  Patient will improve LEFS bat least 18 points indicate improvement in functional outcomes Baseline: 17/80 Goal status: IN PROGRESS  3.  Patient will have Bilat  knee AROM 0-105 degrees to improve functional mobility and facilitate squatting to pick up items from floor. Baseline: See AROM Goal status: IN PROGRESS  4. Patient will have equal to or > 4+/5 MMT throughout tested BLE to improve ability to perform functional mobility, stair ambulation and ADLs.  Baseline: See MMT Goal status: IN PROGRESS  5. Patient will be able to ambulate at least 350 feet during 2MWT with LRAD to demonstrate improved ability to perform functional mobility and associated tasks. Baseline: 305 feet using SPC  Goal status: IN PROGRESS PLAN:  PT FREQUENCY: 1-2x/week  PT DURATION: 6 weeks  PLANNED INTERVENTIONS: Therapeutic exercises, Therapeutic activity, Neuromuscular re-education, Balance training, Gait training, Patient/Family education, Joint manipulation, Joint mobilization, Stair training, Aquatic Therapy, Dry Needling, Electrical stimulation, Spinal manipulation,  Spinal mobilization, Cryotherapy, Moist heat, scar mobilization, Taping, Traction, Ultrasound, Biofeedback, Ionotophoresis 45m/ml Dexamethasone, and Manual therapy.   PLAN FOR NEXT SESSION: Reassess next visits. Adjust POC/ DC as indicated.   11:06 AM, 09/08/22 CJosue HectorPT DPT  Physical Therapist [facility] [phone]

## 2022-09-10 ENCOUNTER — Encounter (HOSPITAL_COMMUNITY): Payer: Self-pay | Admitting: Physical Therapy

## 2022-09-10 ENCOUNTER — Ambulatory Visit (HOSPITAL_COMMUNITY): Payer: 59 | Admitting: Physical Therapy

## 2022-09-10 DIAGNOSIS — M79605 Pain in left leg: Secondary | ICD-10-CM

## 2022-09-10 DIAGNOSIS — M79604 Pain in right leg: Secondary | ICD-10-CM | POA: Diagnosis not present

## 2022-09-10 NOTE — Therapy (Signed)
OUTPATIENT PHYSICAL THERAPY TREATMENT   Patient Name: Breanna Wilkins MRN: [id number] DOB:June 08, 1961, 62 y.o., female Today's Date: 09/10/2022  PHYSICAL THERAPY DISCHARGE SUMMARY  Visits from Start of Care: 7  Current functional level related to goals / functional outcomes: See below    Remaining deficits: See below   Education / Equipment: See assessment    Patient agrees to discharge. Patient goals were partially met. Patient is being discharged due to being pleased with the current functional level.  END OF SESSION:  PT End of Session - 09/10/22 1032     Visit Number 7    Number of Visits 12    Date for PT Re-Evaluation 09/15/22    Authorization Type Medicaid    Authorization Time Period 7 approved 2/6-09/22/22    Authorization - Visit Number 4    Authorization - Number of Visits 7    Progress Note Due on Visit 12    PT Start Time 1032    PT Stop Time 1105    PT Time Calculation (min) 33 min    Activity Tolerance Patient tolerated treatment well    Behavior During Therapy [facility] for tasks assessed/performed             Past Medical History:  Diagnosis Date   Abdominal hernia    Arthritis    Breast cancer ([place])    CAD (coronary artery disease)    Perioperative non-STEMI March, 2014  //   cardiac catheterization at time of non-STEMI, March, 2014, minor nonobstructive coronary disease, vigorous LV function, possibly vasospastic event versus stress cardiomyopathy. No further workup of coronary disease   CHF (congestive heart failure) ([place])    Diverticulosis of colon 10/17/2017   Noted on colonoscopy 10/14/17   Ejection fraction    65-70%, vigorous function, echo, March, 2011   Ejection fraction    Vigorous function at time of catheterization October 06, 2012,   GERD (gastroesophageal reflux disease)    History of colonic polyps 10/17/2017   Colonoscopy 10/14/17   Hyperlipidemia    Hypertension    Internal hemorrhoids without complication [id number]   Noted on  colonoscopy 10/14/17   Lung nodule 12/2009   Very small, left upper lobe by CT June, 2011, one-year followup was stable   Myocardial infarction ([place]) 09/2013   OSA (obstructive sleep apnea)    CPAP   Overweight(278.02)    Pulmonary embolus ([place]) 01/16/2010   on Coumadin indefinitely   Shortness of breath    Shoulder pain    Sleep apnea    Warfarin anticoagulation    Past Surgical History:  Procedure Laterality Date   ABDOMINAL HYSTERECTOMY  07/2002   laparoscopic assisted vaginal hysterectomy for menorrhagia, dysmenorrhea, anemia, fibroids   BREAST BIOPSY Left 09/03/2021   BREAST LUMPECTOMY     BREAST LUMPECTOMY WITH RADIOACTIVE SEED AND SENTINEL LYMPH NODE BIOPSY Left 10/03/2021   Procedure: LEFT BREAST LUMPECTOMY WITH RADIOACTIVE SEED AND SENTINEL LYMPH NODE BIOPSY;  Surgeon: [name], MD;  Location: [place];  Service: General;  Laterality: Left;   COLOSTOMY     INCISIONAL HERNIA REPAIR N/A 09/27/2012   Procedure: LAPAROSCOPIC INCISIONAL HERNIA possible open;  Surgeon: Adin Hector, MD;  Location: [place];  Service: General;  Laterality: N/A;   INSERTION OF MESH N/A 09/27/2012   Procedure: INSERTION OF MESH;  Surgeon: Adin Hector, MD;  Location: [place];  Service: General;  Laterality: N/A;   KNEE ARTHROSCOPY Left    LEFT HEART CATHETERIZATION WITH CORONARY ANGIOGRAM N/A 10/06/2012  Procedure: LEFT HEART CATHETERIZATION WITH CORONARY ANGIOGRAM;  Surgeon: Sherren Mocha, MD;  Location: [facility] C[facility] LAB;  Service: Cardiovascular;  Laterality: N/A;   Patient Active Problem List   Diagnosis Date Noted   Preop examination 10/02/2021   Genetic testing 09/27/2021   Family history of ovarian cancer 09/12/2021   Malignant neoplasm of upper-outer quadrant of left breast in female, estrogen receptor positive ([place]) 09/10/2021   Neck mass 02/22/2021   Musculoskeletal neck pain 05/22/2020   Leg pain, posterior, left 12/21/2019   Strain of rectus abdominis muscle 09/06/2019   Sciatica  of left side 11/10/2018   Meralgia paraesthetica 10/20/2018   Chronic fatigue 10/01/2018   Diverticulosis of colon 10/17/2017   Internal hemorrhoids without complication 123XX123   History of colonic polyps 10/17/2017   BRBPR (bright red blood per rectum) 08/25/2017   Chronic pain 07/10/2017   Long term (current) use of opiate analgesic 09/17/2016   Vitamin D deficiency 09/17/2016   Primary osteoarthritis of right knee 03/04/2016   Nipple dermatitis 11/20/2015   Prediabetes 06/07/2015   Morbid obesity with BMI of 45.0-49.9, adult ([place]) 06/07/2015   GERD (gastroesophageal reflux disease) 05/29/2014   Atypical chest pain 02/23/2014   Plantar fasciitis of right foot 02/07/2014   Carpal tunnel syndrome of right wrist 12/05/2013   Pain of left calf 11/03/2013   Preventative health care 05/12/2013   CHF NYHA class II (symptoms with moderately strenuous activities) ([place]) 10/11/2012   Hyperlipidemia 08/31/2012   History of DVT (deep vein thrombosis) 03/20/2011   Long term (current) use of anticoagulants 08/09/2010   Supraspinatus tendon tear 02/11/2010   History of pulmonary embolism 01/16/2010   Severe obstructive sleep apnea 06/29/2009   Essential hypertension, benign 06/04/2009    PCP: Joni Reining DO  REFERRING PROVIDER: Joni Reining DO  REFERRING DIAG: C50.412,Z17.0 (ICD-10-CM) - Malignant neoplasm of upper-outer quadrant of left breast in female, estrogen receptor positive ([place])  THERAPY DIAG:  Pain in right leg  Pain in left leg  Rationale for Evaluation and Treatment: Rehabilitation  ONSET DATE: about 1 year   SUBJECTIVE:   SUBJECTIVE STATEMENT: Feeling pretty good today. Was recommended by a friend to try horse lineament on her knees. This has helped a lot. She feels ready for DC. She is following up with MD next week.   Evaluation: Patient presents to therapy with complaint of bilateral leg pain. She says she feels some of the pain may be related to her cancer  treatments. She also reports history of bilateral knee OA. She had arthroscopic surgery on LT knee and has "bone on bone" on RT knee. She currently takes pain meds to manage symptoms.   PERTINENT HISTORY: Cancer, knee OA  PAIN:  Are you having pain? Yes: NPRS scale: 6/10 Pain location: bilat knee and lower legs  Pain description: stabbing, aching  Aggravating factors: standing, WB  Relieving factors: rest/ NWB and meds  PRECAUTIONS: None  WEIGHT BEARING RESTRICTIONS: No  FALLS:  Has patient fallen in last 6 months? 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   PLOF: Independent with basic ADLs  PATIENT GOALS: Be able to walk better   NEXT MD VISIT:   OBJECTIVE:   DIAGNOSTIC FINDINGS: NA  PATIENT SURVEYS:  LEFS 21/80 (was 22/80)  COGNITION: Overall cognitive status: Within functional limits for tasks assessed    EDEMA:  See lymphedema  assessment (scheduled)   LOWER EXTREMITY ROM:  Active ROM Right eval Left eval Right 08/21/22 Left 08/21/22 Right 09/10/22 Left 09/10/22  Hip flexion        Hip extension        Hip abduction        Hip adduction        Hip internal rotation        Hip external rotation        Knee flexion 90 100 95 100 100 104  Knee extension 0 0 0 0 0 0  Ankle dorsiflexion        Ankle plantarflexion        Ankle inversion        Ankle eversion         (Blank rows = not tested)  LOWER EXTREMITY MMT:  MMT Right eval Left eval Right 08/21/22 Left 08/21/22 Right 09/10/22 Left 09/10/22  Hip flexion 4 4 4 $ 4+ 5 5  Hip extension        Hip abduction        Hip adduction        Hip internal rotation        Hip external rotation        Knee flexion 4+ 4+ 4+ 4+ 5 5  Knee extension 4 4 4+ 4+ 5 4+  Ankle dorsiflexion 5 5 5 5 5 5  $ Ankle plantarflexion        Ankle inversion        Ankle  eversion         (Blank rows = not tested)  FUNCTIONAL TESTS:  5 times sit to stand: 23.5 second with Ues (was 24.2 sec with Ues) 2 minute walk test: 305 feet with SPC (was 290 feet using SPC)   GAIT: Antalgic, decreased stride, trendelenburg  TODAY'S TREATMENT:                                                                                                                              DATE:  09/10/22 Reassess MMT AROM  Nu step seat 10 lv 3 10 minute (during LEFS)  09/08/22 Nu step seat 10 lv 3 8 minute  Heel raise 2 x 10 Toe raise 2 x 10 4 inch step up x10 each HHA x 2 (tried 6 inch, increased pain)  Standing hip abduction 5lb 2 x 10 each  Standing hip extension 5lb 2 x 10 each  Standing knee flexion 5lb 2 x 10 each  TKE 3 plate x 20 each   LAQ 5lb x20 each   09/01/22 Nu step seat 10 lv 3 8 minute  Slant board 3 x 30" Heel raise 2 x 10 Toe raise 2 x 10 4 inch step up x10 each HHA x 2 Standing hip abduction GTB 2 x 10 Standing hip extension GTB 2 x 10  Sit to stand from elevated seat (added blue foam pad) 2 x 10  08/26/22 Rec  bike (seat 18) 4 min   Heel raise 2 x 10 Toe raise 2 x 10 4 inch step up x10 each HHA x 2 Standing hip abduction RTB 3 x 10 Standing hip extension RTB 3 x 10  Sit to stand from elevated seat (added blue foam pad) 2 x 5  LAQ 5 lb 2 x 10 each   Nu step seat 11 lv 3 5 minute    PATIENT EDUCATION:  Education details: on Eval findings, POC and HEP  Person educated: Patient Education method: Explanation Education comprehension: verbalized understanding  HOME EXERCISE PROGRAM: Access Code: [email or web address] URL: [email or web address]  08/26/22 - Standing Hip Abduction with Resistance at Ankles and Counter Support  - 1-2 x daily - 7 x weekly - 2 sets - 10 reps - Standing Hip Extension with Resistance at Ankles and Counter Support  - 1-2 x daily - 7 x weekly - 2 sets - 10 reps - Sit to Stand with Counter Support  - 1-2 x daily - 7 x  weekly - 2 sets - 10 reps   08/21/22 Access Code: [email or web address] URL: [email or web address] Date: 08/21/2022 Prepared by: Josue Hector  Exercises - Seated March  - 2-3 x daily - 7 x weekly - 2 sets - 10 reps - Seated Long Arc Quad  - 2-3 x daily - 7 x weekly - 2 sets - 10 reps - Seated Knee Flexion Extension AROM   - 2-3 x daily - 7 x weekly - 1 sets - 10 reps - 5 second hold - Heel Raises with Counter Support  - 2-3 x daily - 7 x weekly - 2 sets - 10 reps - Toe Raises with Counter Support  - 2-3 x daily - 7 x weekly - 2 sets - 10 reps  Date: 08/04/2022 Prepared by: Josue Hector  Exercises - Seated March  - 2-3 x daily - 7 x weekly - 2 sets - 10 reps - Seated Long Arc Quad  - 2-3 x daily - 7 x weekly - 2 sets - 10 reps - Seated Knee Flexion Extension AROM   - 2-3 x daily - 7 x weekly - 1 sets - 10 reps - 5 second hold  ASSESSMENT:  CLINICAL IMPRESSION:  Patient has made moderate progress to LTGs. Patient shows improved knee AROM as well as LE MMT. Patient remains limited by knee pain, but is doing much better recently with use of OTC lineament she has been using. Patient request self DC at this time due to being pleased with current LOF. Reviewed therapy goals and HEP. Answered all patient questions and encouraged patient to follow up with therapy services with any further questions or concerns.   OBJECTIVE IMPAIRMENTS: Abnormal gait, decreased activity tolerance, decreased balance, decreased endurance, decreased mobility, difficulty walking, decreased ROM, decreased strength, hypomobility, increased edema, improper body mechanics, and pain.   ACTIVITY LIMITATIONS: carrying, lifting, bending, sitting, standing, squatting, sleeping, stairs, transfers, bed mobility, and locomotion level  PARTICIPATION LIMITATIONS: meal prep, cleaning, laundry, shopping, community activity, occupation, and yard work  PERSONAL FACTORS: Time since onset of injury/illness/exacerbation are  also affecting patient's functional outcome.   REHAB POTENTIAL: Good  CLINICAL DECISION MAKING: Stable/uncomplicated  EVALUATION COMPLEXITY: Low   GOALS: SHORT TERM GOALS: Target date: 08/25/2022  Patient will be independent with initial HEP and self-management strategies to improve functional outcomes Baseline:  Goal status: MET    LONG TERM GOALS: Target date: 09/15/2022  Patient will be independent with advanced HEP and self-management strategies to  improve functional outcomes Baseline:  Goal status: MET  2.  Patient will improve LEFS bat least 18 points indicate improvement in functional outcomes Baseline: 21/80 Goal status: NOT MET  3.  Patient will have Bilat knee AROM 0-105 degrees to improve functional mobility and facilitate squatting to pick up items from floor. Baseline: See AROM Goal status: Partially MET  4. Patient will have equal to or > 4+/5 MMT throughout tested BLE to improve ability to perform functional mobility, stair ambulation and ADLs.  Baseline: See MMT Goal status: MET  5. Patient will be able to ambulate at least 350 feet during 2MWT with LRAD to demonstrate improved ability to perform functional mobility and associated tasks. Baseline: 305 feet using SPC  Goal status: NOT MET PLAN:  PT FREQUENCY: 1-2x/week  PT DURATION: 6 weeks  PLANNED INTERVENTIONS: Therapeutic exercises, Therapeutic activity, Neuromuscular re-education, Balance training, Gait training, Patient/Family education, Joint manipulation, Joint mobilization, Stair training, Aquatic Therapy, Dry Needling, Electrical stimulation, Spinal manipulation, Spinal mobilization, Cryotherapy, Moist heat, scar mobilization, Taping, Traction, Ultrasound, Biofeedback, Ionotophoresis 17m/ml Dexamethasone, and Manual therapy.   PLAN FOR NEXT SESSION: DC to HEP    11:08 AM, 09/10/22 CJosue HectorPT DPT  Physical Therapist with [facility] ([phone]

## 2022-09-18 ENCOUNTER — Telehealth: Payer: Self-pay

## 2022-09-18 ENCOUNTER — Ambulatory Visit (INDEPENDENT_AMBULATORY_CARE_PROVIDER_SITE_OTHER): Payer: 59 | Admitting: Internal Medicine

## 2022-09-18 ENCOUNTER — Other Ambulatory Visit: Payer: Self-pay

## 2022-09-18 ENCOUNTER — Encounter: Payer: Self-pay | Admitting: Internal Medicine

## 2022-09-18 VITALS — BP 138/55 | HR 69 | Temp 97.7°F | Ht 67.0 in | Wt 298.0 lb

## 2022-09-18 DIAGNOSIS — I10 Essential (primary) hypertension: Secondary | ICD-10-CM | POA: Diagnosis not present

## 2022-09-18 DIAGNOSIS — M1711 Unilateral primary osteoarthritis, right knee: Secondary | ICD-10-CM | POA: Diagnosis not present

## 2022-09-18 DIAGNOSIS — G8929 Other chronic pain: Secondary | ICD-10-CM | POA: Diagnosis not present

## 2022-09-18 DIAGNOSIS — R7303 Prediabetes: Secondary | ICD-10-CM | POA: Diagnosis not present

## 2022-09-18 MED ORDER — TRIAMCINOLONE ACETONIDE 40 MG/ML IJ SUSP
80.0000 mg | Freq: Once | INTRAMUSCULAR | Status: AC
  Administered 2022-09-18: 80 mg via INTRA_ARTICULAR

## 2022-09-18 MED ORDER — LIDOCAINE HCL (PF) 1 % IJ SOLN
2.0000 mL | Freq: Once | INTRAMUSCULAR | Status: AC
  Administered 2022-09-18: 2 mL

## 2022-09-18 MED ORDER — OXYCODONE-ACETAMINOPHEN 10-325 MG PO TABS: 1.0000 | ORAL_TABLET | Freq: Four times a day (QID) | ORAL | 0 refills | Status: DC | PRN

## 2022-09-18 MED ORDER — OZEMPIC (2 MG/DOSE) 8 MG/3ML ~~LOC~~ SOPN: 2.0000 mg | PEN_INJECTOR | SUBCUTANEOUS | 5 refills | Status: DC

## 2022-09-18 MED ORDER — SEMAGLUTIDE(0.25 OR 0.5MG/DOS) 2 MG/3ML ~~LOC~~ SOPN: 0.5000 mg | PEN_INJECTOR | SUBCUTANEOUS | 0 refills | Status: DC

## 2022-09-18 MED ORDER — SEMAGLUTIDE (1 MG/DOSE) 4 MG/3ML ~~LOC~~ SOPN: 1.0000 mg | PEN_INJECTOR | SUBCUTANEOUS | 0 refills | Status: DC

## 2022-09-18 NOTE — Assessment & Plan Note (Signed)
Last A1c was 6.1% in 2020.  Would like to get A1c at next opptorunity but did not address at this visit.  Recent glucose has been in normal range.  Will increase ozempic dose to 0.'5mg'$  and continue to titrate up to '2mg'$  over the next 2 months.

## 2022-09-18 NOTE — Progress Notes (Signed)
Established Patient Office Visit  Subjective   Patient ID: Breanna Wilkins, female    DOB: 12-25-1960  Age: 62 y.o. MRN: [id number]  Chief Complaint  Patient presents with   Follow-up    Knee pain. Referral for back injection.   Center is following up for chronic pain she has chronic pain in her back and neck and knees as well as shoulder.  She overall is been able to manage she works as an Environmental consultant on a schoolbus and is overall mobile.  A big issue for her has been her weight where she has hovered around 300 pounds.  We were making some small progress with Ozempic prior to her breast cancer diagnosis and she stopped the medication now the breast cancer has been treated we have restarted Ozempic and she is hopeful to have increased weight loss she is so far tolerating the 0.25 mg dose.  She has followed with Dr. Davy Pique and [place] [facility] for her back pain and has benefited from steroid injections her last one was back in September and she is interested in having another.  She has historically followed with orthopedics but the last plan is for knee replacement once weight gets down to less than 250 I have been doing occasional steroid injections which she has benefited from here in the office.    Objective:     BP (!) 138/55 (BP Location: Right Arm, Patient Position: Sitting, Cuff Size: Large)   Pulse 69   Temp 97.7 F (36.5 C) (Oral)   Ht '5\' 7"'$  (1.702 m)   Wt 298 lb (135.2 kg)   SpO2 100% Comment: RA  BMI 46.67 kg/m  BP Readings from Last 3 Encounters:  09/18/22 (!) 138/55  05/12/22 (!) 147/54  05/08/22 137/63   Wt Readings from Last 3 Encounters:  09/18/22 298 lb (135.2 kg)  06/30/22 290 lb (131.5 kg)  05/12/22 296 lb 4.8 oz (134.4 kg)      Physical Exam Vitals and nursing note reviewed.  Constitutional:      Appearance: Normal appearance.  Cardiovascular:     Rate and Rhythm: Normal rate.  Pulmonary:     Effort: Pulmonary effort is normal.     Breath  sounds: Normal breath sounds.  Musculoskeletal:     Right knee: Tenderness present over the medial joint line. No lateral joint line tenderness. No LCL laxity or MCL laxity. Normal alignment, normal meniscus and normal patellar mobility.     Left knee: No tenderness.  Neurological:     Mental Status: She is alert.  Psychiatric:        Mood and Affect: Mood normal.        Behavior: Behavior normal.      No results found for any visits on 09/18/22.  Last CBC Lab Results  Component Value Date   WBC 6.3 09/11/2021   HGB 13.1 09/11/2021   HCT 40.8 09/11/2021   MCV 92.3 09/11/2021   MCH 29.6 09/11/2021   RDW 15.3 09/11/2021   PLT 264 [id number]   Last metabolic panel Lab Results  Component Value Date   GLUCOSE 98 01/16/2022   NA 138 01/16/2022   K 4.6 01/16/2022   CL 99 01/16/2022   CO2 25 01/16/2022   BUN 18 01/16/2022   CREATININE 0.69 01/16/2022   EGFR 98 01/16/2022   CALCIUM 10.2 01/16/2022   PHOS 2.9 10/01/2012   PROT 7.6 09/11/2021   ALBUMIN 4.0 09/11/2021   LABGLOB 3.3 02/25/2018   AGRATIO 1.3 02/25/2018  BILITOT 0.4 09/11/2021   ALKPHOS 49 09/11/2021   AST 13 (L) 09/11/2021   ALT 12 09/11/2021   ANIONGAP 5 09/11/2021   Last vitamin D Lab Results  Component Value Date   VD25OH 26.3 (L) 01/16/2022      The ASCVD Risk score (Arnett DK, et al., 2019) failed to calculate for the following reasons:   The patient has a prior MI or stroke diagnosis    Assessment & Plan:   Problem List Items Addressed This Visit       Cardiovascular and Mediastinum   Essential hypertension, benign - Primary (Chronic)    Bp at goal, no changes        Musculoskeletal and Integument   Primary osteoarthritis of right knee (Chronic)    Notes that the '80mg'$  kenalog injection we did back in October provided more relief, would like another.  She has followed with otho in the past, is a candidate for knee replacement but needs to loose weight,  we will escalate ozempic  dose.  Steroid injection today      Relevant Medications   oxyCODONE-acetaminophen (PERCOCET) 10-325 MG tablet (Start on 09/22/2022)   oxyCODONE-acetaminophen (PERCOCET) 10-325 MG tablet (Start on 10/22/2022)   oxyCODONE-acetaminophen (PERCOCET) 10-325 MG tablet (Start on 11/21/2022)     Other   Chronic pain (Chronic)    Notes that change from hydrocondone to oxycodone has provided some increased relief.  She was able to work with PT however would get pretty sore afterward.  She just joined the [facility] [employer] and is planning to get involved in water aerobics.   Will refill oxycodone-apap #120 / month x 3.  She will follow up with [facility] neurosurgery dr Davy Pique for repeat back injections.      Relevant Medications   oxyCODONE-acetaminophen (PERCOCET) 10-325 MG tablet (Start on 09/22/2022)   oxyCODONE-acetaminophen (PERCOCET) 10-325 MG tablet (Start on 10/22/2022)   oxyCODONE-acetaminophen (PERCOCET) 10-325 MG tablet (Start on 11/21/2022)   Prediabetes    Last A1c was 6.1% in 2020.  Would like to get A1c at next opptorunity but did not address at this visit.  Recent glucose has been in normal range.  Will increase ozempic dose to 0.'5mg'$  and continue to titrate up to '2mg'$  over the next 2 months.       Return in about 3 months (around 12/17/2022).    Lucious Groves, DO  PROCEDURE NOTE  PROCEDURE: right knee joint steroid injection.  PREOPERATIVE DIAGNOSIS: Osteoarthritis of the right knee.  POSTOPERATIVE DIAGNOSIS: Osteoarthritis of the right knee.  PROCEDURE: The patient was apprised of the risks and the benefits of the procedure and informed consent was obtained. Time-out procedure was performed, with confirmation of the patient's name, date of birth, and correct identification of the right knee to be injected. The patient's knee was then marked at the appropriate site for injection placement. The knee was sterilely prepped with Betadine. A 80 mg (2 milliliter) solution of Kenalog was drawn up into a 5  mL syringe with a 2 mL of 1% lidocaine. The patient was injected with a 25-gauge needle at the anterior-medial aspect of her right flexed knee. There were no complications. The patient tolerated the procedure well. There was minimal bleeding. The patient was instructed to ice her knee upon leaving clinic and refrain from overuse over the next 3 days. The patient was instructed to go to the emergency room with any usual pain, swelling, or redness occurred in the injected area. The patient was given a followup  appointment to evaluate response to the injection to his increased range of motion and reduction of pain.

## 2022-09-18 NOTE — Assessment & Plan Note (Signed)
Bp at goal, no changes

## 2022-09-18 NOTE — Assessment & Plan Note (Addendum)
Notes that change from hydrocondone to oxycodone has provided some increased relief.  She was able to work with PT however would get pretty sore afterward.  She just joined the [employer] and is planning to get involved in water aerobics.   Will refill oxycodone-apap #120 / month x 3.  She will follow up with [facility] neurosurgery dr Davy Pique for repeat back injections.

## 2022-09-18 NOTE — Telephone Encounter (Signed)
Decision:Denied

## 2022-09-18 NOTE — Telephone Encounter (Signed)
Prior Authorization for patient (ozempic) came through on cover my meds was submitted with last office notes and labs awaiting approval or denial

## 2022-09-18 NOTE — Patient Instructions (Addendum)
Dr Davy Pique is the doctor that did the back injections, call his office for your follow up [facility]) [phone]

## 2022-09-18 NOTE — Assessment & Plan Note (Addendum)
Notes that the '80mg'$  kenalog injection we did back in October provided more relief, would like another.  She has followed with otho in the past, is a candidate for knee replacement but needs to loose weight,  we will escalate ozempic dose.  Steroid injection today

## 2022-09-29 ENCOUNTER — Ambulatory Visit: Payer: Medicare Other | Attending: General Surgery

## 2022-09-29 VITALS — Wt 277.2 lb

## 2022-09-29 DIAGNOSIS — Z483 Aftercare following surgery for neoplasm: Secondary | ICD-10-CM | POA: Insufficient documentation

## 2022-09-29 NOTE — Therapy (Signed)
OUTPATIENT PHYSICAL THERAPY SOZO SCREENING NOTE   Patient Name: Breanna Wilkins MRN: [id number] DOB:[birth year], 62 y.o., female Today's Date: 09/29/2022  PCP: Lucious Groves, DO REFERRING PROVIDER: [name], MD   PT End of Session - 09/29/22 1030     Visit Number 7   # unchanged due to screen only   PT Start Time 1028    PT Stop Time 1033    PT Time Calculation (min) 5 min    Activity Tolerance Patient tolerated treatment well    Behavior During Therapy [facility] for tasks assessed/performed             Past Medical History:  Diagnosis Date   Abdominal hernia    Arthritis    Breast cancer ([place])    CAD (coronary artery disease)    Perioperative non-STEMI March, 2014  //   cardiac catheterization at time of non-STEMI, March, 2014, minor nonobstructive coronary disease, vigorous LV function, possibly vasospastic event versus stress cardiomyopathy. No further workup of coronary disease   CHF (congestive heart failure) ([place])    Diverticulosis of colon 10/17/2017   Noted on colonoscopy 10/14/17   Ejection fraction    65-70%, vigorous function, echo, March, 2011   Ejection fraction    Vigorous function at time of catheterization October 06, 2012,   GERD (gastroesophageal reflux disease)    History of colonic polyps 10/17/2017   Colonoscopy 10/14/17   Hyperlipidemia    Hypertension    Internal hemorrhoids without complication [id number]   Noted on colonoscopy 10/14/17   Lung nodule 12/2009   Very small, left upper lobe by CT June, 2011, one-year followup was stable   Myocardial infarction ([place]) 09/2013   OSA (obstructive sleep apnea)    CPAP   Overweight(278.02)    Pulmonary embolus ([place]) 01/16/2010   on Coumadin indefinitely   Shortness of breath    Shoulder pain    Sleep apnea    Warfarin anticoagulation    Past Surgical History:  Procedure Laterality Date   ABDOMINAL HYSTERECTOMY  07/2002   laparoscopic assisted vaginal hysterectomy for menorrhagia, dysmenorrhea,  anemia, fibroids   BREAST BIOPSY Left 09/03/2021   BREAST LUMPECTOMY     BREAST LUMPECTOMY WITH RADIOACTIVE SEED AND SENTINEL LYMPH NODE BIOPSY Left 10/03/2021   Procedure: LEFT BREAST LUMPECTOMY WITH RADIOACTIVE SEED AND SENTINEL LYMPH NODE BIOPSY;  Surgeon: [name], MD;  Location: [place];  Service: General;  Laterality: Left;   COLOSTOMY     INCISIONAL HERNIA REPAIR N/A 09/27/2012   Procedure: LAPAROSCOPIC INCISIONAL HERNIA possible open;  Surgeon: Adin Hector, MD;  Location: [place];  Service: General;  Laterality: N/A;   INSERTION OF MESH N/A 09/27/2012   Procedure: INSERTION OF MESH;  Surgeon: Adin Hector, MD;  Location: [place];  Service: General;  Laterality: N/A;   KNEE ARTHROSCOPY Left    LEFT HEART CATHETERIZATION WITH CORONARY ANGIOGRAM N/A 10/06/2012   Procedure: LEFT HEART CATHETERIZATION WITH CORONARY ANGIOGRAM;  Surgeon: Sherren Mocha, MD;  Location: [facility] CATH LAB;  Service: Cardiovascular;  Laterality: N/A;   Patient Active Problem List   Diagnosis Date Noted   Preop examination 10/02/2021   Genetic testing 09/27/2021   Family history of ovarian cancer 09/12/2021   Malignant neoplasm of upper-outer quadrant of left breast in female, estrogen receptor positive ([place]) 09/10/2021   Neck mass 02/22/2021   Musculoskeletal neck pain 05/22/2020   Leg pain, posterior, left 12/21/2019   Strain of rectus abdominis muscle 09/06/2019   Sciatica of left  side 11/10/2018   Meralgia paraesthetica 10/20/2018   Chronic fatigue 10/01/2018   Diverticulosis of colon 10/17/2017   Internal hemorrhoids without complication 123XX123   History of colonic polyps 10/17/2017   BRBPR (bright red blood per rectum) 08/25/2017   Chronic pain 07/10/2017   Long term (current) use of opiate analgesic 09/17/2016   Vitamin D deficiency 09/17/2016   Primary osteoarthritis of right knee 03/04/2016   Nipple dermatitis 11/20/2015   Prediabetes 06/07/2015   Morbid obesity with BMI of 45.0-49.9,  adult ([place]) 06/07/2015   GERD (gastroesophageal reflux disease) 05/29/2014   Atypical chest pain 02/23/2014   Plantar fasciitis of right foot 02/07/2014   Carpal tunnel syndrome of right wrist 12/05/2013   Pain of left calf 11/03/2013   Preventative health care 05/12/2013   CHF NYHA class II (symptoms with moderately strenuous activities) ([place]) 10/11/2012   Hyperlipidemia 08/31/2012   History of DVT (deep vein thrombosis) 03/20/2011   Long term (current) use of anticoagulants 08/09/2010   Supraspinatus tendon tear 02/11/2010   History of pulmonary embolism 01/16/2010   Severe obstructive sleep apnea 06/29/2009   Essential hypertension, benign 06/04/2009    REFERRING DIAG: left breast cancer at risk for lymphedema  THERAPY DIAG: Aftercare following surgery for neoplasm  PERTINENT HISTORY: Patient was diagnosed on 07/18/2022 with left grade I-II invasive ductal carcinoma breast cancer. She underwent a left lumpectomy and sentinel node biopsy (2 nodes - 1 negative and 1 with isolated tumor cells) on 10/03/2021. It is ER/PR positive and HER2 negative with a Ki67 of < 5%.   PRECAUTIONS: left UE Lymphedema risk, None  SUBJECTIVE: Pt returns for her 3 month L-Dex screen.   PAIN:  Are you having pain? No  SOZO SCREENING: Patient was assessed today using the SOZO machine to determine the lymphedema index score. This was compared to her baseline score. It was determined that she is within the recommended range when compared to her baseline and no further action is needed at this time. She will continue SOZO screenings. These are done every 3 months for 2 years post operatively followed by every 6 months for 2 years, and then annually.   L-DEX FLOWSHEETS - 09/29/22 1000       L-DEX LYMPHEDEMA SCREENING   Measurement Type Unilateral    L-DEX MEASUREMENT EXTREMITY Upper Extremity    POSITION  Standing    DOMINANT SIDE Right    At Risk Side Left    BASELINE SCORE (UNILATERAL) 6    L-DEX  SCORE (UNILATERAL) 9.3    VALUE CHANGE (UNILAT) 3.3               Otelia Limes, PTA 09/29/2022, 10:33 AM

## 2022-10-08 ENCOUNTER — Other Ambulatory Visit: Payer: Self-pay

## 2022-10-09 MED ORDER — CARVEDILOL 6.25 MG PO TABS: 6.2500 mg | ORAL_TABLET | Freq: Two times a day (BID) | ORAL | 3 refills | Status: DC

## 2022-11-14 ENCOUNTER — Other Ambulatory Visit: Payer: Self-pay

## 2022-11-14 ENCOUNTER — Encounter: Payer: Self-pay | Admitting: Adult Health

## 2022-11-14 ENCOUNTER — Inpatient Hospital Stay: Payer: 59 | Attending: Adult Health | Admitting: Adult Health

## 2022-11-14 VITALS — BP 138/56 | HR 74 | Temp 98.1°F | Resp 18 | Ht 67.0 in | Wt 284.6 lb

## 2022-11-14 DIAGNOSIS — Z87891 Personal history of nicotine dependence: Secondary | ICD-10-CM | POA: Diagnosis not present

## 2022-11-14 DIAGNOSIS — Z79811 Long term (current) use of aromatase inhibitors: Secondary | ICD-10-CM | POA: Diagnosis not present

## 2022-11-14 DIAGNOSIS — Z170 Estrogen receptor positive status [ER+]: Secondary | ICD-10-CM | POA: Diagnosis not present

## 2022-11-14 DIAGNOSIS — C50412 Malignant neoplasm of upper-outer quadrant of left female breast: Secondary | ICD-10-CM | POA: Diagnosis present

## 2022-11-14 DIAGNOSIS — Z86718 Personal history of other venous thrombosis and embolism: Secondary | ICD-10-CM | POA: Diagnosis not present

## 2022-11-14 DIAGNOSIS — Z923 Personal history of irradiation: Secondary | ICD-10-CM | POA: Insufficient documentation

## 2022-11-14 DIAGNOSIS — Z8041 Family history of malignant neoplasm of ovary: Secondary | ICD-10-CM | POA: Insufficient documentation

## 2022-11-14 DIAGNOSIS — Z86711 Personal history of pulmonary embolism: Secondary | ICD-10-CM | POA: Diagnosis not present

## 2022-11-14 NOTE — Assessment & Plan Note (Addendum)
Durinda is a 62 year old woman with history of stage Ia ER/PR positive invasive ductal carcinoma of the left breast.  She is s/p lumpectomy, adjuvant radiation, and antiestrogen therapy with Anastrozole which began in 02/2022.  Treatment plan:  Breast conserving surgery with sentinel node biopsy Oncotype Adjuvant radiation therapy Anti-estrogen therapy  -------------------------------------------------------------------------------  Stage IA ER/PR positive left breast IDC: She has no clinical or radiographic signs of breast cancer recurrence.  She will continue on anastrozole daily as she is tolerating this moderately well.  She will repeat mammogram in January 2025. Bone Health: Bone density testing in January 2024 was normal.  This will be due for repeat in January 2026.  She should continue calcium and vitamin D. Health Maintenance: We discussed healthy diet and exercise.  I encouraged her to continue work on weight loss as this can help reduce her risk of breast cancer recurrence and it also can help improve the arthralgias she is experiencing particularly her right knee arthritis.  I recommended she continue to see her primary care provider regularly to date with her routine cancer screenings.  Rufina will return in 6 months for follow-up with Dr. Al Pimple.

## 2022-11-14 NOTE — Progress Notes (Signed)
[facility] Cancer Follow up:    Gust Rung, DO [address] [place] [address]   DIAGNOSIS:  Cancer Staging  Malignant neoplasm of upper-outer quadrant of left breast in female, estrogen receptor positive (HCC) Staging form: Breast, AJCC 8th Edition - Clinical stage from 09/11/2021: Stage IA (cT1c, cN0, cM0, G2, ER+, PR+, HER2-) - Unsigned Stage prefix: Initial diagnosis Method of lymph node assessment: Clinical Histologic grading system: 3 grade system - Pathologic stage from 10/03/2021: Stage IA (pT1c, pN0(i+), cM0, G2, ER+, PR+, HER2-) - Signed by Loa Socks, NP on 05/11/2022 Histologic grading system: 3 grade system   SUMMARY OF ONCOLOGIC HISTORY: Oncology History  Malignant neoplasm of upper-outer quadrant of left breast in female, estrogen receptor positive (HCC)  09/03/2021 Initial Biopsy   Left breast biopsy: IDC, grade 1-2 of 3 ,0.9 cm in greatest linear extent.  ER 100% positive, moderate staining intensity, PR 100% positive, strong staining intensity, KI of less than 5%.  HER-2 IHC equivocal (2+), FISH negative.     Genetic Testing   [facility] CancerNext-Expanded panel was Negative. Of note, a variant of uncertain significance was detected in the BRCA1 gene (p.I429T). Report date is 09/30/2021.   The CancerNext-Expanded gene panel offered by [facility] and includes sequencing, rearrangement, and RNA analysis for the following 77 genes: AIP, ALK, APC, ATM, AXIN2, BAP1, BARD1, BLM, BMPR1A, BRCA1, BRCA2, BRIP1, CDC73, CDH1, CDK4, CDKN1B, CDKN2A, CHEK2, CTNNA1, DICER1, FANCC, FH, FLCN, GALNT12, KIF1B, LZTR1, MAX, MEN1, MET, MLH1, MSH2, MSH3, MSH6, MUTYH, NBN, NF1, NF2, NTHL1, PALB2, PHOX2B, PMS2, POT1, PRKAR1A, PTCH1, PTEN, RAD51C, RAD51D, RB1, RECQL, RET, SDHA, SDHAF2, SDHB, SDHC, SDHD, SMAD4, SMARCA4, SMARCB1, SMARCE1, STK11, SUFU, TMEM127, TP53, TSC1, TSC2, VHL and XRCC2 (sequencing and deletion/duplication); EGFR, EGLN1, HOXB13, KIT, MITF,  PDGFRA, POLD1, and POLE (sequencing only); EPCAM and GREM1 (deletion/duplication only).    10/03/2021 Surgery   A.   BREAST, LEFT, LUMPECTOMY:  -    Invasive carcinoma of no special type (ductal), grade 2, 18 mm in  greatest dimension, all margins negative for tumor.  -    Distance of invasive carcinoma from closest margin:         4 mm  (posterior margin).   B.   LYMPH NODE, LEFT AXILLA #1, SENTINEL, EXCISION:  -    Lymph node, negative for malignancy (0/1).   C.   LYMPH NODE, LEFT AXILLA #2, SENTINEL, EXCISION:  -    Lymph node with isolated tumor cells, metastatic deposit 0.2 mm in  greatest dimension and less than 200 cells.    10/03/2021 Cancer Staging   Staging form: Breast, AJCC 8th Edition - Pathologic stage from 10/03/2021: Stage IA (pT1c, pN0(i+), cM0, G2, ER+, PR+, HER2-) - Signed by Loa Socks, NP on 05/11/2022 Histologic grading system: 3 grade system   10/03/2021 Oncotype testing   22, distant recurrence risk at 9 years at 18%, chemotherapy benefit cannot be excluded. Given small benefit of chemotherapy, she was agreeable to move forward with radiation alone.   11/26/2021 - 12/25/2021 Radiation Therapy   Adjuvant radiation in [place]40.05 Gy in 25 treatments followed by boost of 10 Gy in 5 treatments    02/2022 -  Anti-estrogen oral therapy   Anastrozole daily     CURRENT THERAPY:Anastrozole  INTERVAL HISTORY: HUXLEY VANWAGONER 62 y.o. female returns for f/u of her estrogen positive breast cancer on treatment with Anastrozole.  She is tolerating the anastrozole moderately well.  She has occasional joint aches  and pains.  She has right knee arthritis that bothers her, but otherwise, the other joints are doing moderately well.  She is losing weight and is taking Ozempic.  She is down 12 pounds in the past 6 months.    Her most recent bone density testing occurred on August 07, 2022 and was normal.  Also underwent bilateral digital diagnostic mammogram and breast  tomosynthesis on that same day that demonstrated no mammographic evidence of malignancy in either breast with breast density category B.   Patient Active Problem List   Diagnosis Date Noted   Preop examination 10/02/2021   Genetic testing 09/27/2021   Family history of ovarian cancer 09/12/2021   Malignant neoplasm of upper-outer quadrant of left breast in female, estrogen receptor positive (HCC) 09/10/2021   Neck mass 02/22/2021   Musculoskeletal neck pain 05/22/2020   Leg pain, posterior, left 12/21/2019   Strain of rectus abdominis muscle 09/06/2019   Sciatica of left side 11/10/2018   Meralgia paraesthetica 10/20/2018   Chronic fatigue 10/01/2018   Diverticulosis of colon 10/17/2017   Internal hemorrhoids without complication 10/17/2017   History of colonic polyps 10/17/2017   BRBPR (bright red blood per rectum) 08/25/2017   Chronic pain 07/10/2017   Long term (current) use of opiate analgesic 09/17/2016   Vitamin D deficiency 09/17/2016   Primary osteoarthritis of right knee 03/04/2016   Nipple dermatitis 11/20/2015   Prediabetes 06/07/2015   Morbid obesity with BMI of 45.0-49.9, adult (HCC) 06/07/2015   GERD (gastroesophageal reflux disease) 05/29/2014   Atypical chest pain 02/23/2014   Plantar fasciitis of right foot 02/07/2014   Carpal tunnel syndrome of right wrist 12/05/2013   Pain of left calf 11/03/2013   Preventative health care 05/12/2013   CHF NYHA class II (symptoms with moderately strenuous activities) (HCC) 10/11/2012   Hyperlipidemia 08/31/2012   History of DVT (deep vein thrombosis) 03/20/2011   Long term (current) use of anticoagulants 08/09/2010   Supraspinatus tendon tear 02/11/2010   History of pulmonary embolism 01/16/2010   Severe obstructive sleep apnea 06/29/2009   Essential hypertension, benign 06/04/2009    has No Known Allergies.  MEDICAL HISTORY: Past Medical History:  Diagnosis Date   Abdominal hernia    Arthritis    Breast cancer  (HCC)    CAD (coronary artery disease)    Perioperative non-STEMI March, 2014  //   cardiac catheterization at time of non-STEMI, March, 2014, minor nonobstructive coronary disease, vigorous LV function, possibly vasospastic event versus stress cardiomyopathy. No further workup of coronary disease   CHF (congestive heart failure) (HCC)    Diverticulosis of colon 10/17/2017   Noted on colonoscopy 10/14/17   Ejection fraction    65-70%, vigorous function, echo, March, 2011   Ejection fraction    Vigorous function at time of catheterization October 06, 2012,   GERD (gastroesophageal reflux disease)    History of colonic polyps 10/17/2017   Colonoscopy 10/14/17   Hyperlipidemia    Hypertension    Internal hemorrhoids without complication 10/17/2017   Noted on colonoscopy 10/14/17   Lung nodule 12/2009   Very small, left upper lobe by CT June, 2011, one-year followup was stable   Myocardial infarction (HCC) 09/2013   OSA (obstructive sleep apnea)    CPAP   Overweight(278.02)    Pulmonary embolus (HCC) 01/16/2010   on Coumadin indefinitely   Shortness of breath    Shoulder pain    Sleep apnea    Warfarin anticoagulation     SURGICAL HISTORY: Past  Surgical History:  Procedure Laterality Date   ABDOMINAL HYSTERECTOMY  07/2002   laparoscopic assisted vaginal hysterectomy for menorrhagia, dysmenorrhea, anemia, fibroids   BREAST BIOPSY Left 09/03/2021   BREAST LUMPECTOMY     BREAST LUMPECTOMY WITH RADIOACTIVE SEED AND SENTINEL LYMPH NODE BIOPSY Left 10/03/2021   Procedure: LEFT BREAST LUMPECTOMY WITH RADIOACTIVE SEED AND SENTINEL LYMPH NODE BIOPSY;  Surgeon: Almond Lint, MD;  Location: [facility];  Service: General;  Laterality: Left;   COLOSTOMY     INCISIONAL HERNIA REPAIR N/A 09/27/2012   Procedure: LAPAROSCOPIC INCISIONAL HERNIA possible open;  Surgeon: Ernestene Mention, MD;  Location: [facility] OR;  Service: General;  Laterality: N/A;   INSERTION OF MESH N/A 09/27/2012   Procedure: INSERTION  OF MESH;  Surgeon: Ernestene Mention, MD;  Location: [facility];  Service: General;  Laterality: N/A;   KNEE ARTHROSCOPY Left    LEFT HEART CATHETERIZATION WITH CORONARY ANGIOGRAM N/A 10/06/2012   Procedure: LEFT HEART CATHETERIZATION WITH CORONARY ANGIOGRAM;  Surgeon: Tonny Bollman, MD;  Location[facility] CATH LAB;  Service: Cardiovascular;  Laterality: N/A;    SOCIAL HISTORY: Social History   Socioeconomic History   Marital status: Single    Spouse name: Not on file   Number of children: 0   Years of education: 12   Highest education level: 10th grade  Occupational History   Occupation: Disabled  Tobacco Use   Smoking status: Former    Types: Cigarettes    Quit date: 05/03/1988    Years since quitting: 34.5    Passive exposure: Never   Smokeless tobacco: Never  Vaping Use   Vaping Use: Never used  Substance and Sexual Activity   Alcohol use: Yes    Alcohol/week: 1.0 standard drink of alcohol    Types: 1 Cans of beer per week    Comment: Wine sometimes.   Drug use: Never   Sexual activity: Not Currently  Other Topics Concern   Not on file  Social History Narrative   Financial assistance approved for 100% discount at [facility] and has [facility] card per [place]6/01/2010      Current Social History 02/11/2021        Patient lives with sister in a home  that is 1 story. There are 4 steps up to the entrance the patient uses. There is a railing.       Patient's method of transportation is personal car.      The highest level of education was some high school.      The patient currently is employed as aid on school bus.      Identified important Relationships are "my sister"       Pets : 0       Interests / Fun: "I like to fish and I love my job"       Current Stressors: "none"       Religious / Personal Beliefs: "I don't know"        Social Determinants of Health   Financial Resource Strain: Low Risk  (02/10/2019)   Overall Financial Resource Strain (CARDIA)    Difficulty of  Paying Living Expenses: Not hard at all  Food Insecurity: No Food Insecurity (10/09/2021)   Hunger Vital Sign    Worried About Running Out of Food in the Last Year: Never true    Ran Out of Food in the Last Year: Never true  Transportation Needs: No Transportation Needs (10/09/2021)   PRAPARE - Transportation    Lack of Transportation (  Medical): No    Lack of Transportation (Non-Medical): No  Physical Activity: Unknown (02/10/2019)   Exercise Vital Sign    Days of Exercise per Week: Patient declined    Minutes of Exercise per Session: Patient declined  Stress: No Stress Concern Present (01/16/2021)   [employer] of Occupational Health - Occupational Stress Questionnaire    Feeling of Stress : Not at all  Social Connections: Unknown (02/10/2019)   Social Connection and Isolation Panel [NHANES]    Frequency of Communication with Friends and Family: More than three times a week    Frequency of Social Gatherings with Friends and Family: Three times a week    Attends Religious Services: Never    Active Member of Clubs or Organizations: No    Attends Banker Meetings: Never    Marital Status: Patient declined  Intimate Partner Violence: Not At Risk (07/03/2021)   Humiliation, Afraid, Rape, and Kick questionnaire    Fear of Current or Ex-Partner: No    Emotionally Abused: No    Physically Abused: No    Sexually Abused: No    FAMILY HISTORY: Family History  Problem Relation Age of Onset   Diabetes Mother    Ovarian cancer Mother 45   Diabetes Father    Diabetes Sister    Colon polyps Sister    Brain cancer Maternal Aunt        unsure if it was a brain primary or cancer that metastasized to the brain   Cancer Paternal Uncle        unknown type   Bone cancer Maternal Grandmother        unsure if it was a bone primary or cancer that metastasized to the bones   Breast cancer Neg Hx    Colon cancer Neg Hx    Esophageal cancer Neg Hx    Rectal cancer Neg Hx     Stomach cancer Neg Hx     Review of Systems  Constitutional:  Negative for appetite change, chills, fatigue, fever and unexpected weight change.  HENT:   Negative for hearing loss, lump/mass and trouble swallowing.   Eyes:  Negative for eye problems and icterus.  Respiratory:  Negative for chest tightness, cough and shortness of breath.   Cardiovascular:  Negative for chest pain, leg swelling and palpitations.  Gastrointestinal:  Negative for abdominal distention, abdominal pain, constipation, diarrhea, nausea and vomiting.  Endocrine: Negative for hot flashes.  Genitourinary:  Negative for difficulty urinating.   Musculoskeletal:  Negative for arthralgias.  Skin:  Negative for itching and rash.  Neurological:  Negative for dizziness, extremity weakness, headaches and numbness.  Hematological:  Negative for adenopathy. Does not bruise/bleed easily.  Psychiatric/Behavioral:  Negative for depression. The patient is not nervous/anxious.       PHYSICAL EXAMINATION    Vitals:   11/14/22 1010  BP: (!) 138/56  Pulse: 74  Resp: 18  Temp: 98.1 F (36.7 C)  SpO2: 100%    Physical Exam Constitutional:      General: She is not in acute distress.    Appearance: Normal appearance. She is not toxic-appearing.  HENT:     Head: Normocephalic and atraumatic.  Eyes:     General: No scleral icterus. Cardiovascular:     Rate and Rhythm: Normal rate and regular rhythm.     Pulses: Normal pulses.     Heart sounds: Normal heart sounds.  Pulmonary:     Effort: Pulmonary effort is normal.     Breath  sounds: Normal breath sounds.  Chest:     Comments: Left breast s/p lumpectomy and radiation, no sign of local recurrence, right breast is benign Abdominal:     General: Abdomen is flat. Bowel sounds are normal. There is no distension.     Palpations: Abdomen is soft.     Tenderness: There is no abdominal tenderness.  Musculoskeletal:        General: No swelling.     Cervical back: Neck  supple.  Lymphadenopathy:     Cervical: No cervical adenopathy.  Skin:    General: Skin is warm and dry.     Findings: No rash.  Neurological:     General: No focal deficit present.     Mental Status: She is alert.  Psychiatric:        Mood and Affect: Mood normal.        Behavior: Behavior normal.     LABORATORY DATA: None for this visit   ASSESSMENT and THERAPY PLAN:   Malignant neoplasm of upper-outer quadrant of left breast in female, estrogen receptor positive (HCC) Breanna Wilkins is a 62 year old woman with history of stage Ia ER/PR positive invasive ductal carcinoma of the left breast.  She is s/p lumpectomy, adjuvant radiation, and antiestrogen therapy with Anastrozole which began in 02/2022.  Treatment plan:  Breast conserving surgery with sentinel node biopsy Oncotype Adjuvant radiation therapy Anti-estrogen therapy  -------------------------------------------------------------------------------  Stage IA ER/PR positive left breast IDC: She has no clinical or radiographic signs of breast cancer recurrence.  She will continue on anastrozole daily as she is tolerating this moderately well.  She will repeat mammogram in January 2025. Bone Health: Bone density testing in January 2024 was normal.  This will be due for repeat in January 2026.  She should continue calcium and vitamin D. Health Maintenance: We discussed healthy diet and exercise.  I encouraged her to continue work on weight loss as this can help reduce her risk of breast cancer recurrence and it also can help improve the arthralgias she is experiencing particularly her right knee arthritis.  I recommended she continue to see her primary care provider regularly to date with her routine cancer screenings.  Amorah will return in 6 months for follow-up with Dr. Al Pimple.    All questions were answered. The patient knows to call the clinic with any problems, questions or concerns. We can certainly see the patient much sooner  if necessary.  Total encounter time:30 minutes*in face-to-face visit time, chart review, lab review, care coordination, order entry, and documentation of the encounter time.    Lillard Anes, NP 11/14/22 11:08 AM Medical Oncology and Hematology [facility] [address] [place], [place] [address] Tel. [phone]    Fax. [phone]  *Total Encounter Time as defined by the Centers for Medicare and Medicaid Services includes, in addition to the face-to-face time of a patient visit (documented in the note above) non-face-to-face time: obtaining and reviewing outside history, ordering and reviewing medications, tests or procedures, care coordination (communications with other health care professionals or caregivers) and documentation in the medical record.

## 2022-12-17 ENCOUNTER — Other Ambulatory Visit: Payer: Self-pay

## 2022-12-17 NOTE — Telephone Encounter (Signed)
Patient called she is requesting a rx refill, patient is calling in advance patient is aware she is not due until 6/3.

## 2022-12-18 MED ORDER — OXYCODONE-ACETAMINOPHEN 10-325 MG PO TABS: 1.0000 | ORAL_TABLET | Freq: Four times a day (QID) | ORAL | 0 refills | Status: DC | PRN

## 2022-12-23 ENCOUNTER — Ambulatory Visit (INDEPENDENT_AMBULATORY_CARE_PROVIDER_SITE_OTHER): Payer: 59 | Admitting: Student

## 2022-12-23 DIAGNOSIS — J069 Acute upper respiratory infection, unspecified: Secondary | ICD-10-CM | POA: Diagnosis not present

## 2022-12-23 NOTE — Assessment & Plan Note (Signed)
Breanna Wilkins reports that she started feeling bad about 3 days ago after a recent sick contact. One of her friends came by to spend time with her and she had a cough and congestion symptoms. The following day, Breanna Wilkins began having some sneezing episodes followed by a mild sore throat, congestion, and a runny nose. The following day, she began noticing a mild cough productive of yellowish-green sputum. She has been using some pediatric cold medicine at home with mild relief. Yesterday night, she noticed that her voice was becoming slightly hoarse and this has persisted through today. She denies any fevers, chills, nausea, vomiting, chest pain, shortness of breath. She states that her symptoms are bearable but she wants to know what she can take to help her feel better.   We discussed that she likely has a viral URI that is causing her symptoms likely from recent sick contact. Given that she is 3 days out and her symptoms are not too severe, likely no benefit from flu/COVID testing or antiviral therapy. I have recommended symptom-relief medications in the form of Coricidin spray for cold symptoms (given HTN), cough drops for mild sore throat, and Neti pot for congestion.   I have provided return precautions should she experience any significant dyspnea, chest pain, or significant fevers/chills. She understands. She does have a visit with PCP (Dr. Mikey Bussing) on 01/01/2023 so can assess for improvement in symptoms at that time.  Plan: -symptom relief -f/u with Dr. Mikey Bussing on 6/13 -provided return precautions

## 2022-12-23 NOTE — Progress Notes (Signed)
[facility] Residency Telephone Encounter Continuity Care Appointment  HPI:  This telephone encounter was created for [name] on 12/23/2022 for the following purpose/cc: cold symptoms. Verified identity via DOB and home address. [name] reports that she started feeling bad about 3 days ago after a recent sick contact. One of her friends came by to spend time with her and she had a cough and congestion symptoms. The following day, [name] began having some sneezing episodes followed by a mild sore throat, congestion, and a runny nose. The following day, she began noticing a mild cough productive of yellowish-green sputum. She has been using some pediatric cold medicine at home with mild relief. Yesterday night, she noticed that her voice was becoming slightly hoarse and this has persisted through today. She denies any fevers, chills, nausea, vomiting, chest pain, shortness of breath. She states that her symptoms are bearable but she wants to know what she can take to help her feel better.  We discussed that she likely has a viral URI that is causing her symptoms likely from recent sick contact. Given that she is 3 days out and her symptoms are not too severe, likely no benefit from flu/COVID testing or antiviral therapy. I have recommended symptom-relief medications in the form of Coricidin spray for cold symptoms (given HTN), cough drops for mild sore throat, and Neti pot for congestion.  I have provided return precautions should she experience any significant dyspnea, chest pain, or significant fevers/chills. She understands. She does have a visit with PCP (Dr. Mikey Bussing) on 01/01/2023 so can assess for improvement in symptoms at that time.   Past Medical History:  Past Medical History:  Diagnosis Date   Abdominal hernia    Arthritis    Breast cancer (HCC)    CAD (coronary artery disease)    Perioperative non-STEMI March, 2014  //   cardiac catheterization at time  of non-STEMI, March, 2014, minor nonobstructive coronary disease, vigorous LV function, possibly vasospastic event versus stress cardiomyopathy. No further workup of coronary disease   CHF (congestive heart failure) (HCC)    Diverticulosis of colon 10/17/2017   Noted on colonoscopy 10/14/17   Ejection fraction    65-70%, vigorous function, echo, March, 2011   Ejection fraction    Vigorous function at time of catheterization October 06, 2012,   GERD (gastroesophageal reflux disease)    History of colonic polyps 10/17/2017   Colonoscopy 10/14/17   Hyperlipidemia    Hypertension    Internal hemorrhoids without complication 10/17/2017   Noted on colonoscopy 10/14/17   Lung nodule 12/2009   Very small, left upper lobe by CT June, 2011, one-year followup was stable   Myocardial infarction (HCC) 09/2013   OSA (obstructive sleep apnea)    CPAP   Overweight(278.02)    Pulmonary embolus (HCC) 01/16/2010   on Coumadin indefinitely   Shortness of breath    Shoulder pain    Sleep apnea    Warfarin anticoagulation      ROS:  Negative aside from that listed in HPI.   Assessment / Plan / Recommendations:  Please see A&P under problem oriented charting for assessment of the patient's acute and chronic medical conditions.  As always, pt is advised that if symptoms worsen or new symptoms arise, they should go to an urgent care facility or to to ER for further evaluation.   Consent and Medical Decision Making:  Patient discussed with Dr. Antony Contras This is a telephone encounter between [facility] and [employer] on 12/23/2022 for cold symptoms. The visit was conducted with the patient located at home and Breanna Wilkins at [facility]. The patient's identity was confirmed using their DOB and current address. The patient has consented to being evaluated through a telephone encounter and understands the associated risks (an examination cannot be done and the patient may need to come in for an appointment) /  benefits (allows the patient to remain at home, decreasing exposure to coronavirus). I personally spent 14 minutes on medical discussion.

## 2022-12-24 NOTE — Progress Notes (Signed)
Internal Medicine Clinic Attending  Case discussed with Dr. Jinwala  At the time of the visit.  We reviewed the resident's history and exam and pertinent patient test results.  I agree with the assessment, diagnosis, and plan of care documented in the resident's note.  

## 2023-01-01 ENCOUNTER — Ambulatory Visit (INDEPENDENT_AMBULATORY_CARE_PROVIDER_SITE_OTHER): Payer: 59 | Admitting: Internal Medicine

## 2023-01-01 ENCOUNTER — Encounter: Payer: Self-pay | Admitting: Internal Medicine

## 2023-01-01 VITALS — BP 127/65 | HR 65 | Temp 98.1°F | Ht 67.0 in | Wt 280.3 lb

## 2023-01-01 DIAGNOSIS — I5032 Chronic diastolic (congestive) heart failure: Secondary | ICD-10-CM

## 2023-01-01 DIAGNOSIS — I10 Essential (primary) hypertension: Secondary | ICD-10-CM

## 2023-01-01 DIAGNOSIS — I110 Hypertensive heart disease with heart failure: Secondary | ICD-10-CM

## 2023-01-01 DIAGNOSIS — Z6841 Body Mass Index (BMI) 40.0 and over, adult: Secondary | ICD-10-CM

## 2023-01-01 DIAGNOSIS — E7849 Other hyperlipidemia: Secondary | ICD-10-CM

## 2023-01-01 DIAGNOSIS — Z87891 Personal history of nicotine dependence: Secondary | ICD-10-CM

## 2023-01-01 DIAGNOSIS — G8929 Other chronic pain: Secondary | ICD-10-CM | POA: Diagnosis not present

## 2023-01-01 DIAGNOSIS — M1711 Unilateral primary osteoarthritis, right knee: Secondary | ICD-10-CM

## 2023-01-01 DIAGNOSIS — Z79891 Long term (current) use of opiate analgesic: Secondary | ICD-10-CM

## 2023-01-01 MED ORDER — BEMPEDOIC ACID 180 MG PO TABS: 1.0000 | ORAL_TABLET | Freq: Every day | ORAL | 3 refills | Status: DC

## 2023-01-01 MED ORDER — OXYCODONE-ACETAMINOPHEN 10-325 MG PO TABS: 1.0000 | ORAL_TABLET | Freq: Four times a day (QID) | ORAL | 0 refills | Status: DC | PRN

## 2023-01-01 NOTE — Patient Instructions (Signed)
Call to get a follow up appointment at [facility] for "Gel injections"  [facility] at [facility]  [address]  [address]  [place], [place] [address]  [phone]

## 2023-01-01 NOTE — Progress Notes (Signed)
Established Patient Office Visit  Subjective   Patient ID: Breanna Wilkins, female    DOB: 1960-12-13  Age: 62 y.o. MRN: [id number]  Chief Complaint  Patient presents with   Follow-up   Right knee pain    Recent injury.   Referral for knee pain   Breanna Wilkins is following up after a recent ED visit.  She was seen in May at [facility] ED for knee pain after a fall.  She has chronic osteoarthritis in the right knee but was worse after a fall.  She was referred over to [facility] orthopedics at [place].  They reviewed her CT and told her she has advanced OA which was previously known.  She had been maintaining okay on steroid injections but they discussed with her Synvisc injections and she is interested in this.  She is asking about a referral to them but I discussed that doubt that she would need a referral she just needs to call and get a follow-up appointment to see if insurance will cover the Synvisc injections.  In addition to this she does take chronic opioid medications for her chronic OA and occasional back pain.  After switching from Norco to oxycodone she has noticed an increased functional benefit.  She has been maintaining use without any red flag behavior.  She previously had some bad myalgias that stopped when she stopped taking Crestor.  I prescribed bempedoic acid it sounds like she spoke with the pharmacist that told her it would be a good idea for her to start bempedoic acid however she never filled the prescription.  Encouraged her to attempt to fill the prescription and let me know if the cost is prohibitive.    Objective:     BP 127/65 (BP Location: Right Arm, Patient Position: Sitting, Cuff Size: Large)   Pulse 65   Temp 98.1 F (36.7 C) (Oral)   Ht 5\' 7"  (1.702 m)   Wt 280 lb 4.8 oz (127.1 kg)   SpO2 100%   BMI 43.90 kg/m  BP Readings from Last 3 Encounters:  01/01/23 127/65  11/14/22 (!) 138/56  09/18/22 (!) 138/55   Wt Readings from Last 3 Encounters:  01/01/23 280 lb  4.8 oz (127.1 kg)  11/14/22 284 lb 9.6 oz (129.1 kg)  09/29/22 277 lb 4 oz (125.8 kg)      Physical Exam Vitals and nursing note reviewed.  Constitutional:      Appearance: Normal appearance. She is obese.  Cardiovascular:     Rate and Rhythm: Normal rate and regular rhythm.  Pulmonary:     Effort: Pulmonary effort is normal.  Neurological:     Mental Status: She is alert.  Psychiatric:        Mood and Affect: Mood normal.      Results for orders placed or performed in visit on 01/01/23  BMP8+Anion Gap  Result Value Ref Range   Glucose 82 70 - 99 mg/dL   BUN 21 8 - 27 mg/dL   Creatinine, Ser 9.21 0.57 - 1.00 mg/dL   eGFR 97 >19 ER/DEY/8.14   BUN/Creatinine Ratio 30 (H) 12 - 28   Sodium 140 134 - 144 mmol/L   Potassium 4.4 3.5 - 5.2 mmol/L   Chloride 101 96 - 106 mmol/L   CO2 23 20 - 29 mmol/L   Anion Gap 16.0 10.0 - 18.0 mmol/L   Calcium 10.3 8.7 - 10.3 mg/dL    Last CBC Lab Results  Component Value Date   WBC 6.3  09/11/2021   HGB 13.1 09/11/2021   HCT 40.8 09/11/2021   MCV 92.3 09/11/2021   MCH 29.6 09/11/2021   RDW 15.3 09/11/2021   PLT 264 09/11/2021   Last hemoglobin A1c Lab Results  Component Value Date   HGBA1C 6.1 (H) 09/30/2018      The ASCVD Risk score (Arnett DK, et al., 2019) failed to calculate for the following reasons:   Cannot find a previous HDL lab   Cannot find a previous total cholesterol lab    Assessment & Plan:   Problem List Items Addressed This Visit       Cardiovascular and Mediastinum   Essential hypertension, benign (Chronic)    Blood pressure well-controlled no changes.      Relevant Medications   Bempedoic Acid 180 MG TABS   Other Relevant Orders   BMP8+Anion Gap (Completed)   CHF NYHA class II (symptoms with moderately strenuous activities) (HCC) (Chronic)   Relevant Medications   Bempedoic Acid 180 MG TABS   Other Relevant Orders   BMP8+Anion Gap (Completed)     Musculoskeletal and Integument   Primary  osteoarthritis of right knee (Chronic)    Encouraged her to follow-up with [facility] orthopedics at [facility] and try Synvisc injections.  Reminded her we want to get her weight down to below 250 as she will likely need a knee replacement in the relative near future.      Relevant Medications   oxyCODONE-acetaminophen (PERCOCET) 10-325 MG tablet (Start on 01/21/2023)   oxyCODONE-acetaminophen (PERCOCET) 10-325 MG tablet (Start on 02/20/2023)   oxyCODONE-acetaminophen (PERCOCET) 10-325 MG tablet (Start on 03/23/2023)     Other   Morbid obesity with BMI of 40.0-44.9, adult (HCC) - Primary (Chronic)    We discussed ongoing work needed to get her weight down.  Unfortunately she cannot get Ozempic or other GLP-1's covered under her insurance.      Long term (current) use of opiate analgesic (Chronic)   Hyperlipidemia (Chronic)    Encouraged picking up bempedoic acid 180 mg daily      Relevant Medications   Bempedoic Acid 180 MG TABS   Chronic pain (Chronic)    Changed oxycodone appears to be doing well.  Overall functionally she is doing quite well main issue currently is the right knee which hopefully will have some improved benefit from change of steroid injection to Synvisc injection      Relevant Medications   oxyCODONE-acetaminophen (PERCOCET) 10-325 MG tablet (Start on 01/21/2023)   oxyCODONE-acetaminophen (PERCOCET) 10-325 MG tablet (Start on 02/20/2023)   oxyCODONE-acetaminophen (PERCOCET) 10-325 MG tablet (Start on 03/23/2023)    Return in about 4 months (around 05/03/2023).    Gust Rung, DO

## 2023-01-02 LAB — BMP8+ANION GAP
Anion Gap: 16 mmol/L (ref 10.0–18.0)
BUN/Creatinine Ratio: 30 — ABNORMAL HIGH (ref 12–28)
BUN: 21 mg/dL (ref 8–27)
CO2: 23 mmol/L (ref 20–29)
Calcium: 10.3 mg/dL (ref 8.7–10.3)
Chloride: 101 mmol/L (ref 96–106)
Creatinine, Ser: 0.7 mg/dL (ref 0.57–1.00)
Glucose: 82 mg/dL (ref 70–99)
Potassium: 4.4 mmol/L (ref 3.5–5.2)
Sodium: 140 mmol/L (ref 134–144)
eGFR: 97 mL/min/{1.73_m2} (ref >59)

## 2023-01-02 NOTE — Assessment & Plan Note (Signed)
Encouraged her to follow-up with [facility] orthopedics at [facility] and try Synvisc injections.  Reminded her we want to get her weight down to below 250 as she will likely need a knee replacement in the relative near future.

## 2023-01-02 NOTE — Assessment & Plan Note (Signed)
We discussed ongoing work needed to get her weight down.  Unfortunately she cannot get Ozempic or other GLP-1's covered under her insurance.

## 2023-01-02 NOTE — Assessment & Plan Note (Signed)
Blood pressure well controlled no changes ?

## 2023-01-02 NOTE — Assessment & Plan Note (Signed)
Encouraged picking up bempedoic acid 180 mg daily

## 2023-01-02 NOTE — Assessment & Plan Note (Signed)
Changed oxycodone appears to be doing well.  Overall functionally she is doing quite well main issue currently is the right knee which hopefully will have some improved benefit from change of steroid injection to Synvisc injection

## 2023-01-12 ENCOUNTER — Ambulatory Visit: Payer: 59 | Attending: General Surgery

## 2023-01-12 DIAGNOSIS — Z483 Aftercare following surgery for neoplasm: Secondary | ICD-10-CM | POA: Insufficient documentation

## 2023-01-14 ENCOUNTER — Telehealth: Payer: Self-pay

## 2023-01-14 ENCOUNTER — Telehealth: Payer: Self-pay | Admitting: *Deleted

## 2023-01-14 NOTE — Telephone Encounter (Signed)
Call from Clifton Hill, RN with [facility].  Called to verify that patient's last Steroid injection was 08/29/2022.  Patient is there for the Synvisc injection .  Her insurance will not cover.  Patient will now get another Steroid injection done at [facility] today.

## 2023-01-14 NOTE — Telephone Encounter (Signed)
Pt is requesting a call back ... She is wanting to know her test results from her last office visit

## 2023-01-26 ENCOUNTER — Other Ambulatory Visit: Payer: Self-pay

## 2023-01-26 MED ORDER — ANASTROZOLE 1 MG PO TABS: 1.0000 mg | ORAL_TABLET | Freq: Every day | ORAL | 3 refills | Status: DC

## 2023-01-26 NOTE — Telephone Encounter (Signed)
Return pt's call - stated the pharmacy is waiting on the doctor; Arimidex needs a refill. Informed pt to call her oncologist; telephone# given to pt.

## 2023-01-26 NOTE — Telephone Encounter (Signed)
Requesting to speak with a nurse about anastrozole (ARIMIDEX) 1 MG tablet. Please call pt back.

## 2023-01-27 ENCOUNTER — Other Ambulatory Visit: Payer: Self-pay

## 2023-01-27 DIAGNOSIS — I509 Heart failure, unspecified: Secondary | ICD-10-CM

## 2023-01-28 MED ORDER — FUROSEMIDE 20 MG PO TABS: 20.0000 mg | ORAL_TABLET | Freq: Every day | ORAL | 1 refills | Status: DC

## 2023-02-19 ENCOUNTER — Other Ambulatory Visit: Payer: Self-pay

## 2023-02-20 ENCOUNTER — Telehealth: Payer: Self-pay | Admitting: *Deleted

## 2023-02-20 DIAGNOSIS — I5032 Chronic diastolic (congestive) heart failure: Secondary | ICD-10-CM

## 2023-02-20 DIAGNOSIS — I10 Essential (primary) hypertension: Secondary | ICD-10-CM

## 2023-02-20 DIAGNOSIS — E559 Vitamin D deficiency, unspecified: Secondary | ICD-10-CM

## 2023-02-20 NOTE — Telephone Encounter (Signed)
Breanna Wilkins can you let her know I put in some labs for her that will check potassium and other things.  Ok to stop bempedoic tablets for now and I will discuss further after we get labs back.

## 2023-02-20 NOTE — Telephone Encounter (Signed)
Call from patient states that the Bempedoic 180 mg tablets made her sick.  Was unable to sit down and watch television made her dizzy and headaches.  Stopped taking 5 days ago and feels better.  Wants to know what she can take .  Also requested a Potassium level to be drawn next week if possible . Has another appointment in [place] on Monday and can come in on Monday afternoon.

## 2023-02-23 ENCOUNTER — Other Ambulatory Visit: Payer: 59

## 2023-02-23 DIAGNOSIS — I5032 Chronic diastolic (congestive) heart failure: Secondary | ICD-10-CM

## 2023-02-23 DIAGNOSIS — E559 Vitamin D deficiency, unspecified: Secondary | ICD-10-CM

## 2023-02-23 DIAGNOSIS — I10 Essential (primary) hypertension: Secondary | ICD-10-CM

## 2023-02-23 NOTE — Telephone Encounter (Signed)
RTC to patient informed her that ok that she has stopped the Bempedoic tablets.  Patient informed that labs have been ordered per Dr. Mikey Bussing and to come in for a lab only visit.  Patient in [place] today will come by this afternoon for a Lab only visit.  Dr. Mikey Bussing will review labs and make  decision as to next steps.

## 2023-02-24 ENCOUNTER — Telehealth: Payer: Self-pay | Admitting: Internal Medicine

## 2023-02-24 MED ORDER — EZETIMIBE 10 MG PO TABS: 10.0000 mg | ORAL_TABLET | Freq: Every day | ORAL | 5 refills | Status: DC

## 2023-02-24 NOTE — Telephone Encounter (Signed)
Reviewed her lab work with her overall looks great LDL still high.  Discussed starting Zetia 10 mg daily.  Discussed need for follow-up in about 6 weeks for repeat LDL.

## 2023-03-26 DIAGNOSIS — M170 Bilateral primary osteoarthritis of knee: Secondary | ICD-10-CM | POA: Diagnosis not present

## 2023-03-26 DIAGNOSIS — G8929 Other chronic pain: Secondary | ICD-10-CM | POA: Diagnosis not present

## 2023-03-26 DIAGNOSIS — S8992XD Unspecified injury of left lower leg, subsequent encounter: Secondary | ICD-10-CM | POA: Diagnosis not present

## 2023-04-06 DIAGNOSIS — M170 Bilateral primary osteoarthritis of knee: Secondary | ICD-10-CM | POA: Diagnosis not present

## 2023-04-07 DIAGNOSIS — G4733 Obstructive sleep apnea (adult) (pediatric): Secondary | ICD-10-CM | POA: Diagnosis not present

## 2023-04-08 ENCOUNTER — Ambulatory Visit (INDEPENDENT_AMBULATORY_CARE_PROVIDER_SITE_OTHER): Payer: 59

## 2023-04-08 VITALS — Ht 67.0 in | Wt 280.0 lb

## 2023-04-08 DIAGNOSIS — Z0000 Encounter for general adult medical examination without abnormal findings: Secondary | ICD-10-CM

## 2023-04-08 NOTE — Progress Notes (Signed)
Subjective:   Breanna Wilkins is a 62 y.o. female who presents for Medicare Annual (Subsequent) preventive examination.  Visit Complete: Virtual  I connected with  [name] on 04/08/23 by a audio enabled telemedicine application and verified that I am speaking with the correct person using two identifiers.  Patient Location: Home  Provider Location: Home Office  I discussed the limitations of evaluation and management by telemedicine. The patient expressed understanding and agreed to proceed.  Vital Signs: Because this visit was a virtual/telehealth visit, some criteria may be missing or patient reported. Any vitals not documented were not able to be obtained and vitals that have been documented are patient reported.    Cardiac Risk Factors include: advanced age (>70men, >85 women);obesity (BMI >30kg/m2)     Objective:    Today's Vitals   04/08/23 1036 04/08/23 1037  Weight: 280 lb (127 kg)   Height: 5\' 7"  (1.702 m)   PainSc:  7    Body mass index is 43.85 kg/m.     04/08/2023   10:49 AM 01/01/2023   10:51 AM 09/18/2022   10:22 AM 05/12/2022   10:13 AM 05/08/2022   10:26 AM 01/16/2022   10:10 AM 10/23/2021   10:47 AM  Advanced Directives  Does Patient Have a Medical Advance Directive? Yes No No No No No No  Type of Estate agent of [place];Living will        Copy of Healthcare Power of Attorney in Chart? No - copy requested        Would patient like information on creating a medical advance directive?  No - Patient declined No - Patient declined No - Patient declined No - Patient declined No - Patient declined No - Patient declined    Current Medications (verified) Outpatient Encounter Medications as of 04/08/2023  Medication Sig   anastrozole (ARIMIDEX) 1 MG tablet Take 1 tablet (1 mg total) by mouth daily.   aspirin 81 MG chewable tablet Chew 1 tablet (81 mg total) by mouth daily.   carvedilol (COREG) 6.25 MG tablet Take 1 tablet (6.25  mg total) by mouth 2 (two) times daily with a meal.   Cholecalciferol (VITAMIN D) 50 MCG (2000 UT) CAPS Take 2,000 Units by mouth daily.   diclofenac Sodium (VOLTAREN) 1 % GEL Apply 4g to knee four times a day as needed for pain, apply 2g to shoulder as needed for pain   ezetimibe (ZETIA) 10 MG tablet Take 1 tablet (10 mg total) by mouth daily.   furosemide (LASIX) 20 MG tablet Take 1 tablet (20 mg total) by mouth daily.   Insulin Pen Needle (NOVOFINE) 32G X 6 MM MISC 1 Units by Does not apply route daily.   olmesartan (BENICAR) 40 MG tablet Take 1 tablet (40 mg total) by mouth daily.   oxyCODONE-acetaminophen (PERCOCET) 10-325 MG tablet Take 1 tablet by mouth every 6 (six) hours as needed for pain.   polyethylene glycol (MIRALAX / GLYCOLAX) packet Take 17 g by mouth daily.   rivaroxaban (XARELTO) 20 MG TABS tablet Take 1 tablet (20 mg total) by mouth daily with supper.   cyclobenzaprine (FEXMID) 7.5 MG tablet Take 1 tablet (7.5 mg total) by mouth 3 (three) times daily as needed for muscle spasms. (Patient not taking: Reported on 04/08/2023)   lidocaine (LIDODERM) 5 % Place 1 patch onto the skin daily. Apply patch to right hip as needed (Patient not taking: Reported on 04/08/2023)   lidocaine (XYLOCAINE) 5 % ointment  Apply 1 application topically 2 (two) times daily as needed. (Patient not taking: Reported on 04/08/2023)   nystatin (MYCOSTATIN/NYSTOP) powder Apply 1 Application topically 3 (three) times daily. (Patient not taking: Reported on 04/08/2023)   oxyCODONE-acetaminophen (PERCOCET) 10-325 MG tablet Take 1 tablet by mouth every 6 (six) hours as needed for pain. (Patient not taking: Reported on 04/08/2023)   oxyCODONE-acetaminophen (PERCOCET) 10-325 MG tablet Take 1 tablet by mouth every 6 (six) hours as needed for pain. (Patient not taking: Reported on 04/08/2023)   Semaglutide, 1 MG/DOSE, 4 MG/3ML SOPN Inject 1 mg into the skin once a week. (Patient not taking: Reported on 04/08/2023)    Semaglutide, 2 MG/DOSE, (OZEMPIC, 2 MG/DOSE,) 8 MG/3ML SOPN Inject 2 mg into the skin once a week. (Patient not taking: Reported on 04/08/2023)   Semaglutide,0.25 or 0.5MG /DOS, 2 MG/3ML SOPN Inject 0.5 mg into the skin once a week. (Patient not taking: Reported on 04/08/2023)   No facility-administered encounter medications on file as of 04/08/2023.    Allergies (verified) Patient has no known allergies.   History: Past Medical History:  Diagnosis Date   Abdominal hernia    Arthritis    Breast cancer (HCC)    CAD (coronary artery disease)    Perioperative non-STEMI March, 2014  //   cardiac catheterization at time of non-STEMI, March, 2014, minor nonobstructive coronary disease, vigorous LV function, possibly vasospastic event versus stress cardiomyopathy. No further workup of coronary disease   CHF (congestive heart failure) (HCC)    Diverticulosis of colon 10/17/2017   Noted on colonoscopy 10/14/17   Ejection fraction    65-70%, vigorous function, echo, March, 2011   Ejection fraction    Vigorous function at time of catheterization October 06, 2012,   GERD (gastroesophageal reflux disease)    History of colonic polyps 10/17/2017   Colonoscopy 10/14/17   Hyperlipidemia    Hypertension    Internal hemorrhoids without complication 10/17/2017   Noted on colonoscopy 10/14/17   Lung nodule 12/2009   Very small, left upper lobe by CT June, 2011, one-year followup was stable   Myocardial infarction (HCC) 09/2013   OSA (obstructive sleep apnea)    CPAP   Overweight(278.02)    Pulmonary embolus (HCC) 01/16/2010   on Coumadin indefinitely   Shortness of breath    Shoulder pain    Sleep apnea    Warfarin anticoagulation    Past Surgical History:  Procedure Laterality Date   ABDOMINAL HYSTERECTOMY  07/2002   laparoscopic assisted vaginal hysterectomy for menorrhagia, dysmenorrhea, anemia, fibroids   BREAST BIOPSY Left 09/03/2021   BREAST LUMPECTOMY     BREAST LUMPECTOMY WITH  RADIOACTIVE SEED AND SENTINEL LYMPH NODE BIOPSY Left 10/03/2021   Procedure: LEFT BREAST LUMPECTOMY WITH RADIOACTIVE SEED AND SENTINEL LYMPH NODE BIOPSY;  Surgeon: Almond Lint, MD;  Location: [facility] OR;  Service: General;  Laterality: Left;   COLOSTOMY     INCISIONAL HERNIA REPAIR N/A 09/27/2012   Procedure: LAPAROSCOPIC INCISIONAL HERNIA possible open;  Surgeon: Ernestene Mention, MD;  Location: [facility] OR;  Service: General;  Laterality: N/A;   INSERTION OF MESH N/A 09/27/2012   Procedure: INSERTION OF MESH;  Surgeon: Ernestene Mention, MD;  Location: [facility] OR;  Service: General;  Laterality: N/A;   KNEE ARTHROSCOPY Left    LEFT HEART CATHETERIZATION WITH CORONARY ANGIOGRAM N/A 10/06/2012   Procedure: LEFT HEART CATHETERIZATION WITH CORONARY ANGIOGRAM;  Surgeon: Tonny Bollman, MD;  Location: [facility] CATH LAB;  Service: Cardiovascular;  Laterality: N/A;   Family  History  Problem Relation Age of Onset   Diabetes Mother    Ovarian cancer Mother 65   Diabetes Father    Diabetes Sister    Colon polyps Sister    Brain cancer Maternal Aunt        unsure if it was a brain primary or cancer that metastasized to the brain   Cancer Paternal Uncle        unknown type   Bone cancer Maternal Grandmother        unsure if it was a bone primary or cancer that metastasized to the bones   Breast cancer Neg Hx    Colon cancer Neg Hx    Esophageal cancer Neg Hx    Rectal cancer Neg Hx    Stomach cancer Neg Hx    Social History   Socioeconomic History   Marital status: Single    Spouse name: Not on file   Number of children: 0   Years of education: 12   Highest education level: 10th grade  Occupational History   Occupation: Disabled  Tobacco Use   Smoking status: Former    Current packs/day: 0.00    Types: Cigarettes    Quit date: 05/03/1988    Years since quitting: 34.9    Passive exposure: Never   Smokeless tobacco: Never  Vaping Use   Vaping status: Never Used  Substance and Sexual Activity    Alcohol use: Yes    Alcohol/week: 1.0 standard drink of alcohol    Types: 1 Cans of beer per week    Comment: Wine sometimes.   Drug use: Never   Sexual activity: Not Currently  Other Topics Concern   Not on file  Social History Narrative   Financial assistance approved for 100% discount at [facility] and has [facility] card per [place]6/01/2010      Current Social History 02/11/2021        Patient lives with sister in a home  that is 1 story. There are 4 steps up to the entrance the patient uses. There is a railing.       Patient's method of transportation is personal car.      The highest level of education was some high school.      The patient currently is employed as aid on school bus.      Identified important Relationships are "my sister"       Pets : 0       Interests / Fun: "I like to fish and I love my job"       Current Stressors: "none"       Religious / Personal Beliefs: "I don't know"        Social Determinants of Health   Financial Resource Strain: Low Risk  (04/08/2023)   Overall Financial Resource Strain (CARDIA)    Difficulty of Paying Living Expenses: Not hard at all  Food Insecurity: No Food Insecurity (04/08/2023)   Hunger Vital Sign    Worried About Running Out of Food in the Last Year: Never true    Ran Out of Food in the Last Year: Never true  Transportation Needs: No Transportation Needs (04/08/2023)   PRAPARE - Administrator, Civil Service (Medical): No    Lack of Transportation (Non-Medical): No  Physical Activity: Insufficiently Active (04/08/2023)   Exercise Vital Sign    Days of Exercise per Week: 3 days    Minutes of Exercise per Session: 20 min  Stress: No Stress  Concern Present (04/08/2023)   [employer] of Occupational Health - Occupational Stress Questionnaire    Feeling of Stress : Not at all  Social Connections: Socially Isolated (04/08/2023)   Social Connection and Isolation Panel [NHANES]    Frequency of Communication  with Friends and Family: More than three times a week    Frequency of Social Gatherings with Friends and Family: Once a week    Attends Religious Services: Never    Database administrator or Organizations: No    Attends Engineer, structural: Never    Marital Status: Never married    Tobacco Counseling Counseling given: Not Answered   Clinical Intake:  Pre-visit preparation completed: Yes  Pain : 0-10 Pain Score: 7  Pain Type: Acute pain Pain Location: Knee (both) Pain Descriptors / Indicators: Aching Pain Onset: More than a month ago Pain Frequency: Constant Pain Relieving Factors: Get corizone shots  Pain Relieving Factors: Get corizone shots  BMI - recorded: 43.85 Nutritional Status: BMI > 30  Obese Nutritional Risks: None  How often do you need to have someone help you when you read instructions, pamphlets, or other written materials from your doctor or pharmacy?: 3 - Sometimes  Interpreter Needed?: No  Information entered by :: Coni Homesley, RMA   Activities of Daily Living    04/08/2023   10:41 AM 01/01/2023   10:50 AM  In your present state of health, do you have any difficulty performing the following activities:  Hearing? 0 0  Vision? 0 0  Difficulty concentrating or making decisions? 0 0  Walking or climbing stairs? 1 1  Dressing or bathing? 0 0  Doing errands, shopping? 0 0  Preparing Food and eating ? N   Using the Toilet? N   In the past six months, have you accidently leaked urine? N   Do you have problems with loss of bowel control? N   Managing your Medications? N   Managing your Finances? N   Housekeeping or managing your Housekeeping? N     Patient Care Team: Gust Rung, DO as PCP - General (Internal Medicine) Lyn Records, MD (Inactive) as PCP - Cardiology (Cardiology) Almond Lint, MD as Consulting Physician (General Surgery) Rachel Moulds, MD as Consulting Physician (Hematology and Oncology) Nils Pyle, MD as  Referring Physician (Radiation Oncology)  Indicate any recent Medical Services you may have received from other than [facility] providers in the past year (date may be approximate).     Assessment:   This is a routine wellness examination for Saya.  Hearing/Vision screen Hearing Screening - Comments:: Denies hearing difficulties   Vision Screening - Comments:: Wears eyeglasses   Goals Addressed               This Visit's Progress     Patient Stated (pt-stated)        Would like to get some weight off.      Depression Screen    04/08/2023   10:55 AM 01/01/2023   10:15 AM 09/18/2022   10:21 AM 05/08/2022   10:20 AM 01/16/2022   10:09 AM 10/23/2021   10:47 AM 10/07/2021   10:15 AM  PHQ 2/9 Scores  PHQ - 2 Score 0 0 0 0 0 0 0  PHQ- 9 Score 3      1    Fall Risk    04/08/2023   10:50 AM 01/01/2023   10:14 AM 09/18/2022   10:22 AM 05/08/2022   10:18 AM 01/16/2022  10:09 AM  Fall Risk   Falls in the past year? 1 1 0 0 0  Number falls in past yr: 0 0 0 0 0  Injury with Fall? 1 1 0 0 0  Risk for fall due to :  Other (Comment) Impaired mobility Impaired mobility No Fall Risks  Risk for fall due to: Comment  Tripped.     Follow up Falls evaluation completed;Falls prevention discussed Falls evaluation completed;Falls prevention discussed Falls evaluation completed;Falls prevention discussed Falls evaluation completed;Falls prevention discussed Falls evaluation completed;Falls prevention discussed    MEDICARE RISK AT HOME: Medicare Risk at Home Any stairs in or around the home?: No Home free of loose throw rugs in walkways, pet beds, electrical cords, etc?: Yes Adequate lighting in your home to reduce risk of falls?: Yes Life alert?: Yes (has one but does not wear it.) Use of a cane, walker or w/c?: Yes Grab bars in the bathroom?: Yes Shower chair or bench in shower?: No Elevated toilet seat or a handicapped toilet?: No  TIMED UP AND GO:  Was the test performed?  No     Cognitive Function:        04/08/2023   10:51 AM 02/11/2021   10:06 AM  6CIT Screen  What Year? 0 points 0 points  What month? 0 points 0 points  What time? 0 points 0 points  Count back from 20 0 points 0 points  Months in reverse 4 points 0 points  Repeat phrase 2 points 0 points  Total Score 6 points 0 points    Immunizations Immunization History  Administered Date(s) Administered   Influenza,inj,Quad PF,6+ Mos 03/24/2019, 06/06/2021, 05/08/2022   PPD Test 09/09/2017   Td 07/22/2003   Tdap 05/29/2014    TDAP status: Up to date  Flu Vaccine status: Due, Education has been provided regarding the importance of this vaccine. Advised may receive this vaccine at local pharmacy or Health Dept. Aware to provide a copy of the vaccination record if obtained from local pharmacy or Health Dept. Verbalized acceptance and understanding.  Pneumococcal vaccine status: Due, Education has been provided regarding the importance of this vaccine. Advised may receive this vaccine at local pharmacy or Health Dept. Aware to provide a copy of the vaccination record if obtained from local pharmacy or Health Dept. Verbalized acceptance and understanding.  Covid-19 vaccine status: Declined, Education has been provided regarding the importance of this vaccine but patient still declined. Advised may receive this vaccine at local pharmacy or Health Dept.or vaccine clinic. Aware to provide a copy of the vaccination record if obtained from local pharmacy or Health Dept. Verbalized acceptance and understanding.  Qualifies for Shingles Vaccine? Yes   Zostavax completed Yes   Shingrix Completed?: Yes  Screening Tests Health Maintenance  Topic Date Due   COVID-19 Vaccine (1) Never done   Zoster Vaccines- Shingrix (1 of 2) Never done   Cervical Cancer Screening (HPV/Pap Cotest)  Never done   INFLUENZA VACCINE  02/19/2023   Medicare Annual Wellness (AWV)  04/07/2024   DTaP/Tdap/Td (3 - Td or Tdap)  05/29/2024   MAMMOGRAM  08/07/2024   Colonoscopy  03/14/2026   Hepatitis C Screening  Completed   HIV Screening  Completed   HPV VACCINES  Aged Out    Health Maintenance  Health Maintenance Due  Topic Date Due   COVID-19 Vaccine (1) Never done   Zoster Vaccines- Shingrix (1 of 2) Never done   Cervical Cancer Screening (HPV/Pap Cotest)  Never done  INFLUENZA VACCINE  02/19/2023    Colorectal cancer screening: Type of screening: Colonoscopy. Completed 03/14/2021. Repeat every 5 years  Mammogram status: Completed 08/07/2022. Repeat every year   Lung Cancer Screening: (Low Dose CT Chest recommended if Age 60-80 years, 20 pack-year currently smoking OR have quit w/in 15years.) does not qualify.   Lung Cancer Screening Referral: N/A  Additional Screening:  Hepatitis C Screening: does qualify; Completed 09/11/2016  Vision Screening: Recommended annual ophthalmology exams for early detection of glaucoma and other disorders of the eye. Is the patient up to date with their annual eye exam?  Yes  Who is the provider or what is the name of the office in which the patient attends annual eye exams? N/A If pt is not established with a provider, would they like to be referred to a provider to establish care? No .   Dental Screening: Recommended annual dental exams for proper oral hygiene   Community Resource Referral / Chronic Care Management: CRR required this visit?  No   CCM required this visit?  No     Plan:     I have personally reviewed and noted the following in the patient's chart:   Medical and social history Use of alcohol, tobacco or illicit drugs  Current medications and supplements including opioid prescriptions. Patient is not currently taking opioid prescriptions. Functional ability and status Nutritional status Physical activity Advanced directives List of other physicians Hospitalizations, surgeries, and ER visits in previous 12 months Vitals Screenings  to include cognitive, depression, and falls Referrals and appointments  In addition, I have reviewed and discussed with patient certain preventive protocols, quality metrics, and best practice recommendations. A written personalized care plan for preventive services as well as general preventive health recommendations were provided to patient.     Shalamar Plourde L Raef Sprigg, CMA   04/08/2023   After Visit Summary: (MyChart) Due to this being a telephonic visit, the after visit summary with patients personalized plan was offered to patient via MyChart   Nurse Notes: Patient is due for a Pneumonia, Flu and Covid vaccine, which she would like to get the Flu vaccine during her visit tomorrow at [facility] office.  Patient stated that she had received a Shingrix vaccine during her office visit at [facility], which there is no record of her getting the vaccine.   She had no other concerns to address today.

## 2023-04-08 NOTE — Patient Instructions (Signed)
Breanna Wilkins , Thank you for taking time to come for your Medicare Wellness Visit. I appreciate your ongoing commitment to your health goals. Please review the following plan we discussed and let me know if I can assist you in the future.   Referrals/Orders/Follow-Ups/Clinician Recommendations: You are due for a Flu and Pneumonia vaccine.  It was nice to speak with you today.  Aim for 30 minutes of exercise or brisk walking, 6-8 glasses of water, and 5 servings of fruits and vegetables each day.   This is a list of the screening recommended for you and due dates:  Health Maintenance  Topic Date Due   COVID-19 Vaccine (1) Never done   Zoster (Shingles) Vaccine (1 of 2) Never done   Pap with HPV screening  Never done   Flu Shot  02/19/2023   Medicare Annual Wellness Visit  04/07/2024   DTaP/Tdap/Td vaccine (3 - Td or Tdap) 05/29/2024   Mammogram  08/07/2024   Colon Cancer Screening  03/14/2026   Hepatitis C Screening  Completed   HIV Screening  Completed   HPV Vaccine  Aged Out    Advanced directives: (Copy Requested) Please bring a copy of your health care power of attorney and living will to the office to be added to your chart at your convenience.  Next Medicare Annual Wellness Visit scheduled for next year: Yes

## 2023-04-09 ENCOUNTER — Ambulatory Visit (INDEPENDENT_AMBULATORY_CARE_PROVIDER_SITE_OTHER): Payer: 59 | Admitting: Internal Medicine

## 2023-04-09 ENCOUNTER — Other Ambulatory Visit: Payer: Self-pay

## 2023-04-09 VITALS — BP 134/63 | HR 73 | Temp 97.7°F | Ht 67.0 in | Wt 294.2 lb

## 2023-04-09 DIAGNOSIS — Z7901 Long term (current) use of anticoagulants: Secondary | ICD-10-CM | POA: Diagnosis not present

## 2023-04-09 DIAGNOSIS — G8929 Other chronic pain: Secondary | ICD-10-CM

## 2023-04-09 DIAGNOSIS — I10 Essential (primary) hypertension: Secondary | ICD-10-CM

## 2023-04-09 DIAGNOSIS — Z789 Other specified health status: Secondary | ICD-10-CM | POA: Insufficient documentation

## 2023-04-09 DIAGNOSIS — I509 Heart failure, unspecified: Secondary | ICD-10-CM | POA: Diagnosis not present

## 2023-04-09 DIAGNOSIS — I2609 Other pulmonary embolism with acute cor pulmonale: Secondary | ICD-10-CM

## 2023-04-09 DIAGNOSIS — I82533 Chronic embolism and thrombosis of popliteal vein, bilateral: Secondary | ICD-10-CM

## 2023-04-09 DIAGNOSIS — M1711 Unilateral primary osteoarthritis, right knee: Secondary | ICD-10-CM

## 2023-04-09 DIAGNOSIS — I2510 Atherosclerotic heart disease of native coronary artery without angina pectoris: Secondary | ICD-10-CM | POA: Diagnosis not present

## 2023-04-09 DIAGNOSIS — Z23 Encounter for immunization: Secondary | ICD-10-CM

## 2023-04-09 DIAGNOSIS — M5416 Radiculopathy, lumbar region: Secondary | ICD-10-CM | POA: Diagnosis not present

## 2023-04-09 DIAGNOSIS — Z79891 Long term (current) use of opiate analgesic: Secondary | ICD-10-CM

## 2023-04-09 DIAGNOSIS — I110 Hypertensive heart disease with heart failure: Secondary | ICD-10-CM

## 2023-04-09 DIAGNOSIS — Z86718 Personal history of other venous thrombosis and embolism: Secondary | ICD-10-CM

## 2023-04-09 DIAGNOSIS — E7849 Other hyperlipidemia: Secondary | ICD-10-CM

## 2023-04-09 LAB — GLUCOSE, CAPILLARY: Glucose-Capillary: 135 mg/dL — ABNORMAL HIGH (ref 70–99)

## 2023-04-09 LAB — POCT GLYCOSYLATED HEMOGLOBIN (HGB A1C): Hemoglobin A1C: 5.7 % — AB (ref 4.0–5.6)

## 2023-04-09 MED ORDER — OXYCODONE-ACETAMINOPHEN 10-325 MG PO TABS: 1.0000 | ORAL_TABLET | ORAL | 0 refills | Status: DC | PRN

## 2023-04-09 MED ORDER — OLMESARTAN MEDOXOMIL 40 MG PO TABS: 40.0000 mg | ORAL_TABLET | Freq: Every day | ORAL | 3 refills | Status: DC

## 2023-04-09 MED ORDER — SEMAGLUTIDE-WEIGHT MANAGEMENT 0.25 MG/0.5ML ~~LOC~~ SOAJ: 0.2500 mg | SUBCUTANEOUS | 0 refills | Status: DC

## 2023-04-09 MED ORDER — SEMAGLUTIDE-WEIGHT MANAGEMENT 1 MG/0.5ML ~~LOC~~ SOAJ: 1.0000 mg | SUBCUTANEOUS | 0 refills | Status: AC

## 2023-04-09 MED ORDER — SEMAGLUTIDE-WEIGHT MANAGEMENT 1.7 MG/0.75ML ~~LOC~~ SOAJ: 1.7000 mg | SUBCUTANEOUS | 0 refills | Status: AC

## 2023-04-09 MED ORDER — SEMAGLUTIDE-WEIGHT MANAGEMENT 0.5 MG/0.5ML ~~LOC~~ SOAJ: 0.5000 mg | SUBCUTANEOUS | 0 refills | Status: AC

## 2023-04-09 MED ORDER — SEMAGLUTIDE-WEIGHT MANAGEMENT 2.4 MG/0.75ML ~~LOC~~ SOAJ: 2.4000 mg | SUBCUTANEOUS | 0 refills | Status: AC

## 2023-04-09 MED ORDER — EZETIMIBE 10 MG PO TABS: 10.0000 mg | ORAL_TABLET | Freq: Every day | ORAL | 3 refills | Status: DC

## 2023-04-09 MED ORDER — FUROSEMIDE 20 MG PO TABS: 20.0000 mg | ORAL_TABLET | Freq: Every day | ORAL | 1 refills | Status: DC | PRN

## 2023-04-09 MED ORDER — RIVAROXABAN 20 MG PO TABS: 20.0000 mg | ORAL_TABLET | Freq: Every day | ORAL | 3 refills | Status: DC

## 2023-04-09 MED ORDER — CARVEDILOL 6.25 MG PO TABS: 6.2500 mg | ORAL_TABLET | Freq: Two times a day (BID) | ORAL | 3 refills | Status: DC

## 2023-04-09 NOTE — Assessment & Plan Note (Signed)
BP at goal on recheck continues current medications.

## 2023-04-09 NOTE — Progress Notes (Addendum)
Established Patient Office Visit  Subjective   Patient ID: Breanna Wilkins, female    DOB: 1960/08/29  Age: 62 y.o. MRN: [id number]  Chief Complaint  Patient presents with   Follow-up   Talk about Reginal Lutes   Here for follow-up of obesity and hypertension.  Denies any chest pain or headaches.  Taking her medications as prescribed.  Weight has gone up.  Had been doing well a year or 2 ago with Ozempic and hoping to get back on GLP-1 RA.  Denies any orthopnea or persistent lower extremity edema.     Objective:     BP 134/63 (BP Location: Right Arm, Patient Position: Sitting, Cuff Size: Normal)   Pulse 73   Temp 97.7 F (36.5 C) (Oral)   Ht 5\' 7"  (1.702 m)   Wt 294 lb 3.2 oz (133.4 kg)   SpO2 100% Comment: RA  BMI 46.08 kg/m  BP Readings from Last 3 Encounters:  04/09/23 134/63  01/01/23 127/65  11/14/22 (!) 138/56   Wt Readings from Last 3 Encounters:  04/09/23 294 lb 3.2 oz (133.4 kg)  04/08/23 280 lb (127 kg)  01/01/23 280 lb 4.8 oz (127.1 kg)      Physical Exam Constitutional:      Appearance: Normal appearance. She is obese.  Cardiovascular:     Rate and Rhythm: Normal rate.  Musculoskeletal:     Right lower leg: No edema.     Left lower leg: No edema.  Neurological:     Mental Status: She is alert.     Gait: Gait abnormal.      Results for orders placed or performed in visit on 04/09/23  Glucose, capillary  Result Value Ref Range   Glucose-Capillary 135 (H) 70 - 99 mg/dL  POC Hbg W0J  Result Value Ref Range   Hemoglobin A1C 5.7 (A) 4.0 - 5.6 %   HbA1c POC (<> result, manual entry)     HbA1c, POC (prediabetic range)     HbA1c, POC (controlled diabetic range)      Last CBC Lab Results  Component Value Date   WBC 6.2 02/23/2023   HGB 12.5 02/23/2023   HCT 37.8 02/23/2023   MCV 90 02/23/2023   MCH 29.7 02/23/2023   RDW 14.0 02/23/2023   PLT 299 02/23/2023   Last metabolic panel Lab Results  Component Value Date   GLUCOSE 100 (H)  02/23/2023   NA 141 02/23/2023   K 4.2 02/23/2023   CL 102 02/23/2023   CO2 23 02/23/2023   BUN 23 02/23/2023   CREATININE 0.90 02/23/2023   EGFR 72 02/23/2023   CALCIUM 10.3 02/23/2023   PHOS 2.9 10/01/2012   PROT 7.4 02/23/2023   ALBUMIN 4.4 02/23/2023   LABGLOB 3.0 02/23/2023   AGRATIO 1.3 02/25/2018   BILITOT 0.3 02/23/2023   ALKPHOS 70 02/23/2023   AST 18 02/23/2023   ALT 16 02/23/2023   ANIONGAP 5 09/11/2021   Last hemoglobin A1c Lab Results  Component Value Date   HGBA1C 5.7 (A) 04/09/2023      The 10-year ASCVD risk score (Arnett DK, et al., 2019) is: 9.9%    Assessment & Plan:   Problem List Items Addressed This Visit       Cardiovascular and Mediastinum   Essential hypertension, benign - Primary (Chronic)    BP at goal on recheck continues current medications.      Relevant Medications   carvedilol (COREG) 6.25 MG tablet   ezetimibe (ZETIA) 10 MG tablet  furosemide (LASIX) 20 MG tablet   olmesartan (BENICAR) 40 MG tablet   rivaroxaban (XARELTO) 20 MG TABS tablet   Other Relevant Orders   POC Hbg A1C (Completed)   Glucose, capillary (Completed)   CAD (coronary artery disease), native coronary artery    Breanna Wilkins has a history of NSTEMI in 2014 and non obstructive CAD.  Her BMI is 46, she is not a diabetic.  She would benefit from [facility] for weight loss and CAD risk reduction.      Relevant Medications   carvedilol (COREG) 6.25 MG tablet   ezetimibe (ZETIA) 10 MG tablet   furosemide (LASIX) 20 MG tablet   olmesartan (BENICAR) 40 MG tablet   rivaroxaban (XARELTO) 20 MG TABS tablet   Other Relevant Orders   Glucose, capillary (Completed)     Other   Morbid obesity with BMI of 45.0-49.9, adult (HCC) (Chronic)    Given comorbid CAD, Will try again to get her approved for [facility]. Check A1c today      Relevant Medications   Semaglutide-Weight Management 0.25 MG/0.5ML SOAJ   Semaglutide-Weight Management 0.5 MG/0.5ML SOAJ (Start on 05/08/2023)    Semaglutide-Weight Management 1 MG/0.5ML SOAJ (Start on 06/06/2023)   Semaglutide-Weight Management 1.7 MG/0.75ML SOAJ (Start on 07/05/2023)   Semaglutide-Weight Management 2.4 MG/0.75ML SOAJ (Start on 08/03/2023)   Other Relevant Orders   Ambulatory Referral for DME   Long term (current) use of opiate analgesic (Chronic)   Long term (current) use of anticoagulants (Chronic)   Relevant Medications   rivaroxaban (XARELTO) 20 MG TABS tablet   Hyperlipidemia (Chronic)    Unable to tolerate multiple statins, continue zetia.      Relevant Medications   carvedilol (COREG) 6.25 MG tablet   ezetimibe (ZETIA) 10 MG tablet   furosemide (LASIX) 20 MG tablet   olmesartan (BENICAR) 40 MG tablet   rivaroxaban (XARELTO) 20 MG TABS tablet   History of DVT (deep vein thrombosis) (Chronic)   Relevant Medications   rivaroxaban (XARELTO) 20 MG TABS tablet   Chronic pain (Chronic)    Managing her pain (mostly knees but shoulders as well) ok with percocet 10-325.  Notes sometimes she has to take an extra dose in the morning thus she will have to break her evening dose in half to make sure that she has enough.  Ambulating with a cane but reports a friend recently gave her a rolling walker which has been helping for moving about the house.  Denies any significant constipation or adverse side effects from the medication.  Still wants to avoid surgical intervention.  Will increase dispense number to 130 per month  MME ~65.      Relevant Medications   oxyCODONE-acetaminophen (PERCOCET) 10-325 MG tablet (Start on 04/20/2023)   oxyCODONE-acetaminophen (PERCOCET) 10-325 MG tablet (Start on 06/19/2023)   oxyCODONE-acetaminophen (PERCOCET) 10-325 MG tablet (Start on 04/20/2023)   Other Relevant Orders   Ambulatory Referral for DME   Statin intolerance   Other Visit Diagnoses     CHF NYHA class II (symptoms with moderately strenuous activities), unspecified failure chronicity, unspecified type (HCC)        Relevant Medications   carvedilol (COREG) 6.25 MG tablet   ezetimibe (ZETIA) 10 MG tablet   furosemide (LASIX) 20 MG tablet   olmesartan (BENICAR) 40 MG tablet   rivaroxaban (XARELTO) 20 MG TABS tablet   Encounter for immunization       Relevant Orders   Flu vaccine trivalent PF, 6mos and older(Flulaval,Afluria,Fluarix,Fluzone) (Completed)   Chronic deep  vein thrombosis (DVT) of both popliteal veins (HCC)       Relevant Medications   carvedilol (COREG) 6.25 MG tablet   ezetimibe (ZETIA) 10 MG tablet   furosemide (LASIX) 20 MG tablet   olmesartan (BENICAR) 40 MG tablet   rivaroxaban (XARELTO) 20 MG TABS tablet   Other chronic pulmonary embolism with acute cor pulmonale (HCC)       Relevant Medications   carvedilol (COREG) 6.25 MG tablet   ezetimibe (ZETIA) 10 MG tablet   furosemide (LASIX) 20 MG tablet   olmesartan (BENICAR) 40 MG tablet   rivaroxaban (XARELTO) 20 MG TABS tablet       Return in about 6 months (around 10/07/2023).    Gust Rung, DO

## 2023-04-09 NOTE — Patient Instructions (Signed)
We are sending in wegovy for prior authorization.

## 2023-04-09 NOTE — Assessment & Plan Note (Signed)
Unable to tolerate multiple statins, continue zetia.

## 2023-04-09 NOTE — Assessment & Plan Note (Addendum)
Managing her pain (mostly knees but shoulders as well) ok with percocet 10-325.  Notes sometimes she has to take an extra dose in the morning thus she will have to break her evening dose in half to make sure that she has enough.  Ambulating with a cane but reports a friend recently gave her a rolling walker which has been helping for moving about the house.  Denies any significant constipation or adverse side effects from the medication.  Still wants to avoid surgical intervention.  Will increase dispense number to 130 per month  MME ~65.

## 2023-04-09 NOTE — Assessment & Plan Note (Signed)
Breanna Wilkins has a history of NSTEMI in 2014 and non obstructive CAD.  Her BMI is 46, she is not a diabetic.  She would benefit from [facility] for weight loss and CAD risk reduction.

## 2023-04-09 NOTE — Assessment & Plan Note (Signed)
Given comorbid CAD, Will try again to get her approved for [facility]. Check A1c today

## 2023-04-10 NOTE — Addendum Note (Signed)
Addended by: Carlynn Purl C on: 04/10/2023 11:44 AM   Modules accepted: Orders

## 2023-04-10 NOTE — Assessment & Plan Note (Signed)
Having a little more trouble getting around, still using a cane at times but found it much easier when using friend's rollatior walker.

## 2023-04-22 ENCOUNTER — Telehealth: Payer: Self-pay

## 2023-04-22 NOTE — Telephone Encounter (Signed)
Pt called  stated she have not received her  [facility] )   that she needed a pa ... Pa was  submitted with last office notes and labs on cover my meds .Marland Kitchen Awaiting  approval or denial

## 2023-04-22 NOTE — Telephone Encounter (Signed)
DECISION :   Your prior authorization for Reginal Lutes has been approved!   More Info Personalized support and financial assistance may be available through the [employer] program.    For more information, and to see program requirements, click on the More Info button to the right.  Message from plan: Request Reference Number: [id number]. WEGOVY INJ 0.25MG  is approved through 07/20/2024. Your patient may now fill this prescription and it will be covered.. Authorization Expiration Date: July 20, 2024.        ( COPY SENT TO PHARMACY AND PLACED TO BE SCANNED TO CHART )

## 2023-04-23 ENCOUNTER — Other Ambulatory Visit: Payer: Self-pay

## 2023-04-23 MED ORDER — SEMAGLUTIDE-WEIGHT MANAGEMENT 0.25 MG/0.5ML ~~LOC~~ SOAJ: 0.2500 mg | SUBCUTANEOUS | 0 refills | Status: AC

## 2023-04-23 NOTE — Telephone Encounter (Signed)
Patient called she stated [facility] in [place] is out of [place], she is requesting the rx to be sent to [facility] in [place] on  [address].

## 2023-05-05 ENCOUNTER — Other Ambulatory Visit (HOSPITAL_COMMUNITY): Payer: Self-pay | Admitting: General Surgery

## 2023-05-05 DIAGNOSIS — I890 Lymphedema, not elsewhere classified: Secondary | ICD-10-CM | POA: Diagnosis not present

## 2023-05-05 DIAGNOSIS — R519 Headache, unspecified: Secondary | ICD-10-CM | POA: Diagnosis not present

## 2023-05-05 DIAGNOSIS — M898X9 Other specified disorders of bone, unspecified site: Secondary | ICD-10-CM | POA: Diagnosis not present

## 2023-05-05 DIAGNOSIS — R221 Localized swelling, mass and lump, neck: Secondary | ICD-10-CM | POA: Diagnosis not present

## 2023-05-05 DIAGNOSIS — Z170 Estrogen receptor positive status [ER+]: Secondary | ICD-10-CM | POA: Diagnosis not present

## 2023-05-05 DIAGNOSIS — C50412 Malignant neoplasm of upper-outer quadrant of left female breast: Secondary | ICD-10-CM | POA: Diagnosis not present

## 2023-05-06 ENCOUNTER — Other Ambulatory Visit (HOSPITAL_COMMUNITY): Payer: Self-pay | Admitting: General Surgery

## 2023-05-06 DIAGNOSIS — C50412 Malignant neoplasm of upper-outer quadrant of left female breast: Secondary | ICD-10-CM

## 2023-05-14 ENCOUNTER — Ambulatory Visit (HOSPITAL_COMMUNITY)
Admission: RE | Admit: 2023-05-14 | Discharge: 2023-05-14 | Disposition: A | Discharge status: Home / Self Care | Payer: 59 | Source: Ambulatory Visit | Attending: General Surgery | Admitting: General Surgery

## 2023-05-14 DIAGNOSIS — R519 Headache, unspecified: Secondary | ICD-10-CM | POA: Diagnosis not present

## 2023-05-14 DIAGNOSIS — I672 Cerebral atherosclerosis: Secondary | ICD-10-CM | POA: Diagnosis not present

## 2023-05-14 DIAGNOSIS — C50912 Malignant neoplasm of unspecified site of left female breast: Secondary | ICD-10-CM | POA: Diagnosis not present

## 2023-05-14 DIAGNOSIS — C50412 Malignant neoplasm of upper-outer quadrant of left female breast: Secondary | ICD-10-CM | POA: Diagnosis not present

## 2023-05-14 DIAGNOSIS — Z170 Estrogen receptor positive status [ER+]: Secondary | ICD-10-CM | POA: Diagnosis not present

## 2023-05-14 DIAGNOSIS — R221 Localized swelling, mass and lump, neck: Secondary | ICD-10-CM | POA: Diagnosis not present

## 2023-05-14 DIAGNOSIS — I6523 Occlusion and stenosis of bilateral carotid arteries: Secondary | ICD-10-CM | POA: Diagnosis not present

## 2023-05-14 DIAGNOSIS — C50919 Malignant neoplasm of unspecified site of unspecified female breast: Secondary | ICD-10-CM | POA: Diagnosis not present

## 2023-05-14 DIAGNOSIS — K111 Hypertrophy of salivary gland: Secondary | ICD-10-CM | POA: Diagnosis not present

## 2023-05-14 MED ORDER — IOHEXOL 300 MG/ML  SOLN
100.0000 mL | Freq: Once | INTRAMUSCULAR | Status: AC | PRN
  Administered 2023-05-14: 100 mL via INTRAVENOUS

## 2023-05-19 ENCOUNTER — Encounter: Payer: Self-pay | Admitting: *Deleted

## 2023-05-19 ENCOUNTER — Inpatient Hospital Stay: Payer: 59 | Attending: Hematology and Oncology | Admitting: Hematology and Oncology

## 2023-05-19 VITALS — BP 164/71 | HR 73 | Temp 97.5°F | Resp 18 | Wt 292.1 lb

## 2023-05-19 DIAGNOSIS — Z79811 Long term (current) use of aromatase inhibitors: Secondary | ICD-10-CM | POA: Diagnosis not present

## 2023-05-19 DIAGNOSIS — C50412 Malignant neoplasm of upper-outer quadrant of left female breast: Secondary | ICD-10-CM | POA: Diagnosis not present

## 2023-05-19 DIAGNOSIS — Z923 Personal history of irradiation: Secondary | ICD-10-CM | POA: Diagnosis not present

## 2023-05-19 DIAGNOSIS — Z170 Estrogen receptor positive status [ER+]: Secondary | ICD-10-CM | POA: Diagnosis not present

## 2023-05-19 DIAGNOSIS — Z9221 Personal history of antineoplastic chemotherapy: Secondary | ICD-10-CM | POA: Insufficient documentation

## 2023-05-19 MED ORDER — EXEMESTANE 25 MG PO TABS: 25.0000 mg | ORAL_TABLET | Freq: Every day | ORAL | 1 refills | Status: DC

## 2023-05-19 NOTE — Assessment & Plan Note (Signed)
Breanna Wilkins is a 62 year old woman with history of stage Ia ER/PR positive invasive ductal carcinoma of the left breast.  She is s/p lumpectomy, adjuvant radiation, and antiestrogen therapy with Anastrozole which began in 02/2022.  Breast Cancer on Anastrozole Severe bone and joint pain, possibly related to Anastrozole. Discussed the potential side effects of Anastrozole and the possibility of switching to a different medication. -Discontinue Anastrozole. -Start Exemestane (Aromasin). -Call patient in 2-4 weeks to assess response to new medication.  Arthritis Severe pain in both knees, not relieved by steroid injections or gel. -Consider referral to orthopedics for further management.  Lymphedema Noted swelling and pain in the breast post-radiation. -Order compression bra to help manage lymphedema.  Weight Management Patient is taking Wegovy for weight loss. -Encourage continued use of Wegovy and regular exercise.  Follow-up Plan to call patient in 2-4 weeks to assess response to Exemestane. In-person visit in 6 months.

## 2023-05-19 NOTE — Progress Notes (Signed)
[facility] Cancer Follow up:    Breanna Rung, DO [address] [place] [address]   DIAGNOSIS:  Cancer Staging  Malignant neoplasm of upper-outer quadrant of left breast in female, estrogen receptor positive (HCC) Staging form: Breast, AJCC 8th Edition - Clinical stage from 09/11/2021: Stage IA (cT1c, cN0, cM0, G2, ER+, PR+, HER2-) - Unsigned Stage prefix: Initial diagnosis Method of lymph node assessment: Clinical Histologic grading system: 3 grade system - Pathologic stage from 10/03/2021: Stage IA (pT1c, pN0(i+), cM0, G2, ER+, PR+, HER2-) - Signed by Loa Socks, NP on 05/11/2022 Histologic grading system: 3 grade system   SUMMARY OF ONCOLOGIC HISTORY: Oncology History  Malignant neoplasm of upper-outer quadrant of left breast in female, estrogen receptor positive (HCC)  09/03/2021 Initial Biopsy   Left breast biopsy: IDC, grade 1-2 of 3 ,0.9 cm in greatest linear extent.  ER 100% positive, moderate staining intensity, PR 100% positive, strong staining intensity, KI of less than 5%.  HER-2 IHC equivocal (2+), FISH negative.     Genetic Testing   [facility] CancerNext-Expanded panel was Negative. Of note, a variant of uncertain significance was detected in the BRCA1 gene (p.I429T). Report date is 09/30/2021.   The CancerNext-Expanded gene panel offered by [facility] and includes sequencing, rearrangement, and RNA analysis for the following 77 genes: AIP, ALK, APC, ATM, AXIN2, BAP1, BARD1, BLM, BMPR1A, BRCA1, BRCA2, BRIP1, CDC73, CDH1, CDK4, CDKN1B, CDKN2A, CHEK2, CTNNA1, DICER1, FANCC, FH, FLCN, GALNT12, KIF1B, LZTR1, MAX, MEN1, MET, MLH1, MSH2, MSH3, MSH6, MUTYH, NBN, NF1, NF2, NTHL1, PALB2, PHOX2B, PMS2, POT1, PRKAR1A, PTCH1, PTEN, RAD51C, RAD51D, RB1, RECQL, RET, SDHA, SDHAF2, SDHB, SDHC, SDHD, SMAD4, SMARCA4, SMARCB1, SMARCE1, STK11, SUFU, TMEM127, TP53, TSC1, TSC2, VHL and XRCC2 (sequencing and deletion/duplication); EGFR, EGLN1, HOXB13, KIT, MITF,  PDGFRA, POLD1, and POLE (sequencing only); EPCAM and GREM1 (deletion/duplication only).    10/03/2021 Surgery   A.   BREAST, LEFT, LUMPECTOMY:  -    Invasive carcinoma of no special type (ductal), grade 2, 18 mm in  greatest dimension, all margins negative for tumor.  -    Distance of invasive carcinoma from closest margin:         4 mm  (posterior margin).   B.   LYMPH NODE, LEFT AXILLA #1, SENTINEL, EXCISION:  -    Lymph node, negative for malignancy (0/1).   C.   LYMPH NODE, LEFT AXILLA #2, SENTINEL, EXCISION:  -    Lymph node with isolated tumor cells, metastatic deposit 0.2 mm in  greatest dimension and less than 200 cells.    10/03/2021 Cancer Staging   Staging form: Breast, AJCC 8th Edition - Pathologic stage from 10/03/2021: Stage IA (pT1c, pN0(i+), cM0, G2, ER+, PR+, HER2-) - Signed by Loa Socks, NP on 05/11/2022 Histologic grading system: 3 grade system   10/03/2021 Oncotype testing   22, distant recurrence risk at 9 years at 18%, chemotherapy benefit cannot be excluded. Given small benefit of chemotherapy, she was agreeable to move forward with radiation alone.   11/26/2021 - 12/25/2021 Radiation Therapy   Adjuvant radiation in [place].05 Gy in 25 treatments followed by boost of 10 Gy in 5 treatments    02/2022 -  Anti-estrogen oral therapy   Anastrozole daily     CURRENT THERAPY:Anastrozole  INTERVAL HISTORY:  Breanna Wilkins 62 y.o. female returns for f/u of her estrogen positive breast cancer on treatment with Anastrozole.   Discussed the use of AI scribe software for clinical note transcription  with the patient, who gave verbal consent to proceed.  History of Present Illness    The patient, with a history of breast cancer treated with surgery, radiation, and anastrozole, presents with severe joint pain, particularly in the legs, which has significantly impaired her mobility. She reports that the pain is so severe that she can hardly get up. She has  tried steroid injections and gel for her knees, but these treatments have not provided relief. She also reports soreness in her breasts, which she attributes to radiation treatment.  In addition to her joint pain, the patient has been experiencing a nagging headache, which she believes may be related to her need for new glasses. She also reports a lump in her neck, which she noticed after starting [facility] for weight loss. The lump has since decreased in size and does not cause her any discomfort.  The patient has been trying to lose weight and has access to a gym in her apartment complex, but her joint pain has prevented her from exercising. She expresses a desire to return to the gym once her joint pain is managed.   Patient Active Problem List   Diagnosis Date Noted   Statin intolerance 04/09/2023   Viral URI with cough 12/23/2022   Preop examination 10/02/2021   Genetic testing 09/27/2021   Family history of ovarian cancer 09/12/2021   Malignant neoplasm of upper-outer quadrant of left breast in female, estrogen receptor positive (HCC) 09/10/2021   Neck mass 02/22/2021   Musculoskeletal neck pain 05/22/2020   Leg pain, posterior, left 12/21/2019   Strain of rectus abdominis muscle 09/06/2019   Sciatica of left side 11/10/2018   Meralgia paraesthetica 10/20/2018   Chronic fatigue 10/01/2018   Diverticulosis of colon 10/17/2017   Internal hemorrhoids without complication 10/17/2017   History of colonic polyps 10/17/2017   BRBPR (bright red blood per rectum) 08/25/2017   Chronic pain 07/10/2017   Long term (current) use of opiate analgesic 09/17/2016   Vitamin D deficiency 09/17/2016   Primary osteoarthritis of right knee 03/04/2016   Nipple dermatitis 11/20/2015   Prediabetes 06/07/2015   Morbid obesity with BMI of 45.0-49.9, adult (HCC) 06/07/2015   GERD (gastroesophageal reflux disease) 05/29/2014   Atypical chest pain 02/23/2014   Plantar fasciitis of right foot 02/07/2014    Carpal tunnel syndrome of right wrist 12/05/2013   Pain of left calf 11/03/2013   Preventative health care 05/12/2013   CAD (coronary artery disease), native coronary artery    CHF NYHA class II (symptoms with moderately strenuous activities) (HCC) 10/11/2012   Hyperlipidemia 08/31/2012   History of DVT (deep vein thrombosis) 03/20/2011   Long term (current) use of anticoagulants 08/09/2010   Supraspinatus tendon tear 02/11/2010   History of pulmonary embolism 01/16/2010   Severe obstructive sleep apnea 06/29/2009   Essential hypertension, benign 06/04/2009    has No Known Allergies.  MEDICAL HISTORY: Past Medical History:  Diagnosis Date   Abdominal hernia    Arthritis    Breast cancer (HCC)    CAD (coronary artery disease)    Perioperative non-STEMI March, 2014  //   cardiac catheterization at time of non-STEMI, March, 2014, minor nonobstructive coronary disease, vigorous LV function, possibly vasospastic event versus stress cardiomyopathy. No further workup of coronary disease   CHF (congestive heart failure) (HCC)    Diverticulosis of colon 10/17/2017   Noted on colonoscopy 10/14/17   Ejection fraction    65-70%, vigorous function, echo, March, 2011   Ejection fraction  Vigorous function at time of catheterization October 06, 2012,   GERD (gastroesophageal reflux disease)    History of colonic polyps 10/17/2017   Colonoscopy 10/14/17   Hyperlipidemia    Hypertension    Internal hemorrhoids without complication 10/17/2017   Noted on colonoscopy 10/14/17   Lung nodule 12/2009   Very small, left upper lobe by CT June, 2011, one-year followup was stable   Myocardial infarction (HCC) 09/2013   OSA (obstructive sleep apnea)    CPAP   Overweight(278.02)    Pulmonary embolus (HCC) 01/16/2010   on Coumadin indefinitely   Shortness of breath    Shoulder pain    Sleep apnea    Warfarin anticoagulation     SURGICAL HISTORY: Past Surgical History:  Procedure Laterality Date    ABDOMINAL HYSTERECTOMY  07/2002   laparoscopic assisted vaginal hysterectomy for menorrhagia, dysmenorrhea, anemia, fibroids   BREAST BIOPSY Left 09/03/2021   BREAST LUMPECTOMY     BREAST LUMPECTOMY WITH RADIOACTIVE SEED AND SENTINEL LYMPH NODE BIOPSY Left 10/03/2021   Procedure: LEFT BREAST LUMPECTOMY WITH RADIOACTIVE SEED AND SENTINEL LYMPH NODE BIOPSY;  Surgeon: Almond Lint, MD;  Location: [facility] OR;  Service: General;  Laterality: Left;   COLOSTOMY     INCISIONAL HERNIA REPAIR N/A 09/27/2012   Procedure: LAPAROSCOPIC INCISIONAL HERNIA possible open;  Surgeon: Ernestene Mention, MD;  Location: [facility] OR;  Service: General;  Laterality: N/A;   INSERTION OF MESH N/A 09/27/2012   Procedure: INSERTION OF MESH;  Surgeon: Ernestene Mention, MD;  Location: [facility] OR;  Service: General;  Laterality: N/A;   KNEE ARTHROSCOPY Left    LEFT HEART CATHETERIZATION WITH CORONARY ANGIOGRAM N/A 10/06/2012   Procedure: LEFT HEART CATHETERIZATION WITH CORONARY ANGIOGRAM;  Surgeon: Tonny Bollman, MD;  Location: [facility] CATH LAB;  Service: Cardiovascular;  Laterality: N/A;    SOCIAL HISTORY: Social History   Socioeconomic History   Marital status: Single    Spouse name: Not on file   Number of children: 0   Years of education: 12   Highest education level: 10th grade  Occupational History   Occupation: Disabled  Tobacco Use   Smoking status: Former    Current packs/day: 0.00    Types: Cigarettes    Quit date: 05/03/1988    Years since quitting: 35.0    Passive exposure: Never   Smokeless tobacco: Never  Vaping Use   Vaping status: Never Used  Substance and Sexual Activity   Alcohol use: Yes    Alcohol/week: 1.0 standard drink of alcohol    Types: 1 Cans of beer per week    Comment: Wine sometimes.   Drug use: Never   Sexual activity: Not Currently  Other Topics Concern   Not on file  Social History Narrative   Financial assistance approved for 100% discount at [facility] and has [facility] card per Gavin Pound  Hill6/01/2010      Current Social History 02/11/2021        Patient lives with sister in a home  that is 1 story. There are 4 steps up to the entrance the patient uses. There is a railing.       Patient's method of transportation is personal car.      The highest level of education was some high school.      The patient currently is employed as aid on school bus.      Identified important Relationships are "my sister"       Pets : 0  Interests / Fun: "I like to fish and I love my job"       Current Stressors: "none"       Religious / Personal Beliefs: "I don't know"        Social Determinants of Health   Financial Resource Strain: Low Risk  (04/08/2023)   Overall Financial Resource Strain (CARDIA)    Difficulty of Paying Living Expenses: Not hard at all  Food Insecurity: No Food Insecurity (04/08/2023)   Hunger Vital Sign    Worried About Running Out of Food in the Last Year: Never true    Ran Out of Food in the Last Year: Never true  Transportation Needs: No Transportation Needs (04/08/2023)   PRAPARE - Administrator, Civil Service (Medical): No    Lack of Transportation (Non-Medical): No  Physical Activity: Insufficiently Active (04/08/2023)   Exercise Vital Sign    Days of Exercise per Week: 3 days    Minutes of Exercise per Session: 20 min  Stress: No Stress Concern Present (04/08/2023)   [employer] of Occupational Health - Occupational Stress Questionnaire    Feeling of Stress : Not at all  Social Connections: Socially Isolated (04/08/2023)   Social Connection and Isolation Panel [NHANES]    Frequency of Communication with Friends and Family: More than three times a week    Frequency of Social Gatherings with Friends and Family: Once a week    Attends Religious Services: Never    Database administrator or Organizations: No    Attends Banker Meetings: Never    Marital Status: Never married  Intimate Partner Violence: Not At Risk  (07/03/2021)   Humiliation, Afraid, Rape, and Kick questionnaire    Fear of Current or Ex-Partner: No    Emotionally Abused: No    Physically Abused: No    Sexually Abused: No    FAMILY HISTORY: Family History  Problem Relation Age of Onset   Diabetes Mother    Ovarian cancer Mother 45   Diabetes Father    Diabetes Sister    Colon polyps Sister    Brain cancer Maternal Aunt        unsure if it was a brain primary or cancer that metastasized to the brain   Cancer Paternal Uncle        unknown type   Bone cancer Maternal Grandmother        unsure if it was a bone primary or cancer that metastasized to the bones   Breast cancer Neg Hx    Colon cancer Neg Hx    Esophageal cancer Neg Hx    Rectal cancer Neg Hx    Stomach cancer Neg Hx     Review of Systems  Constitutional:  Negative for appetite change, chills, fatigue, fever and unexpected weight change.  HENT:   Negative for hearing loss, lump/mass and trouble swallowing.   Eyes:  Negative for eye problems and icterus.  Respiratory:  Negative for chest tightness, cough and shortness of breath.   Cardiovascular:  Negative for chest pain, leg swelling and palpitations.  Gastrointestinal:  Negative for abdominal distention, abdominal pain, constipation, diarrhea, nausea and vomiting.  Endocrine: Negative for hot flashes.  Genitourinary:  Negative for difficulty urinating.   Musculoskeletal:  Negative for arthralgias.  Skin:  Negative for itching and rash.  Neurological:  Negative for dizziness, extremity weakness, headaches and numbness.  Hematological:  Negative for adenopathy. Does not bruise/bleed easily.  Psychiatric/Behavioral:  Negative for depression. The  patient is not nervous/anxious.       PHYSICAL EXAMINATION    Vitals:   05/19/23 1118  BP: (!) 164/71  Pulse: 73  Resp: 18  Temp: (!) 97.5 F (36.4 C)  SpO2: 99%    Physical Exam Constitutional:      General: She is not in acute distress.     Appearance: Normal appearance. She is not toxic-appearing.  HENT:     Head: Normocephalic and atraumatic.  Eyes:     General: No scleral icterus. Cardiovascular:     Rate and Rhythm: Normal rate and regular rhythm.     Pulses: Normal pulses.     Heart sounds: Normal heart sounds.  Pulmonary:     Effort: Pulmonary effort is normal.     Breath sounds: Normal breath sounds.  Chest:     Comments: Left breast s/p lumpectomy and radiation, no sign of local recurrence, right breast is benign Abdominal:     General: Abdomen is flat. Bowel sounds are normal. There is no distension.     Palpations: Abdomen is soft.     Tenderness: There is no abdominal tenderness.  Musculoskeletal:        General: No swelling.     Cervical back: Neck supple.  Lymphadenopathy:     Cervical: No cervical adenopathy.  Skin:    General: Skin is warm and dry.     Findings: No rash.  Neurological:     General: No focal deficit present.     Mental Status: She is alert.  Psychiatric:        Mood and Affect: Mood normal.        Behavior: Behavior normal.     LABORATORY DATA: None for this visit   ASSESSMENT and THERAPY PLAN:   Malignant neoplasm of upper-outer quadrant of left breast in female, estrogen receptor positive (HCC) Gustavo is a 63 year old woman with history of stage Ia ER/PR positive invasive ductal carcinoma of the left breast.  She is s/p lumpectomy, adjuvant radiation, and antiestrogen therapy with Anastrozole which began in 02/2022.  Breast Cancer on Anastrozole Severe bone and joint pain, possibly related to Anastrozole. Discussed the potential side effects of Anastrozole and the possibility of switching to a different medication. -Discontinue Anastrozole. -Start Exemestane (Aromasin). -Call patient in 2-4 weeks to assess response to new medication.  Arthritis Severe pain in both knees, not relieved by steroid injections or gel. -Consider referral to orthopedics for further  management.  Lymphedema Noted swelling and pain in the breast post-radiation. -Order compression bra to help manage lymphedema.  Weight Management Patient is taking Wegovy for weight loss. -Encourage continued use of Wegovy and regular exercise.  Follow-up Plan to call patient in 2-4 weeks to assess response to Exemestane. In-person visit in 6 months.    All questions were answered. The patient knows to call the clinic with any problems, questions or concerns. We can certainly see the patient much sooner if necessary.  Total encounter time:30 minutes*in face-to-face visit time, chart review, lab review, care coordination, order entry, and documentation of the encounter time.  *Total Encounter Time as defined by the Centers for Medicare and Medicaid Services includes, in addition to the face-to-face time of a patient visit (documented in the note above) non-face-to-face time: obtaining and reviewing outside history, ordering and reviewing medications, tests or procedures, care coordination (communications with other health care professionals or caregivers) and documentation in the medical record.

## 2023-05-22 DIAGNOSIS — G4733 Obstructive sleep apnea (adult) (pediatric): Secondary | ICD-10-CM | POA: Diagnosis not present

## 2023-06-16 ENCOUNTER — Inpatient Hospital Stay: Payer: 59 | Attending: Hematology and Oncology | Admitting: Hematology and Oncology

## 2023-06-16 ENCOUNTER — Encounter: Payer: Self-pay | Admitting: Hematology and Oncology

## 2023-06-16 DIAGNOSIS — Z170 Estrogen receptor positive status [ER+]: Secondary | ICD-10-CM

## 2023-06-16 DIAGNOSIS — C50412 Malignant neoplasm of upper-outer quadrant of left female breast: Secondary | ICD-10-CM

## 2023-06-16 MED ORDER — TAMOXIFEN CITRATE 20 MG PO TABS: 20.0000 mg | ORAL_TABLET | Freq: Every day | ORAL | 3 refills | Status: DC

## 2023-06-16 NOTE — Assessment & Plan Note (Signed)
Breanna Wilkins is a 62 year old woman with history of stage Ia ER/PR positive invasive ductal carcinoma of the left breast.  She is s/p lumpectomy, adjuvant radiation, and antiestrogen therapy with Anastrozole which began in 02/2022.  Breast Cancer on Anastrozole Severe bone and joint pain, possibly related to Anastrozole.  She has tried exemestane, this did not help her bone pains as well hence she would like to try something different.  We have today discussed about tamoxifen, [place] action, adverse effects including but not limited to menopausal symptoms, risk of DVT/PE (she is already on anticoagulation), endometrial hyperplasia which does not apply to her since she had hysterectomy and improvement in bone density. She is willing to try this, prescription dispensed to the pharmacy of her choice.  She was encouraged to call if she does not tolerate this well.  She will otherwise return to clinic as scheduled.

## 2023-06-16 NOTE — Progress Notes (Signed)
[facility]cer Center Cancer Follow up:    Breanna Rung, DO [address] [place] [address]   DIAGNOSIS:  Cancer Staging  Malignant neoplasm of upper-outer quadrant of left breast in female, estrogen receptor positive (HCC) Staging form: Breast, AJCC 8th Edition - Clinical stage from 09/11/2021: Stage IA (cT1c, cN0, cM0, G2, ER+, PR+, HER2-) - Unsigned Stage prefix: Initial diagnosis Method of lymph node assessment: Clinical Histologic grading system: 3 grade system - Pathologic stage from 10/03/2021: Stage IA (pT1c, pN0(i+), cM0, G2, ER+, PR+, HER2-) - Signed by Loa Socks, NP on 05/11/2022 Histologic grading system: 3 grade system   SUMMARY OF ONCOLOGIC HISTORY: Oncology History  Malignant neoplasm of upper-outer quadrant of left breast in female, estrogen receptor positive (HCC)  09/03/2021 Initial Biopsy   Left breast biopsy: IDC, grade 1-2 of 3 ,0.9 cm in greatest linear extent.  ER 100% positive, moderate staining intensity, PR 100% positive, strong staining intensity, KI of less than 5%.  HER-2 IHC equivocal (2+), FISH negative.     Genetic Testing   [facility] CancerNext-Expanded panel was Negative. Of note, a variant of uncertain significance was detected in the BRCA1 gene (p.[id number]). Report date is 09/30/2021.   The CancerNext-Expanded gene panel offered by [facility] and includes sequencing, rearrangement, and RNA analysis for the following 77 genes: AIP, ALK, APC, ATM, AXIN2, BAP1, BARD1, BLM, BMPR1A, BRCA1, BRCA2, BRIP1, CDC73, CDH1, CDK4, CDKN1B, CDKN2A, CHEK2, CTNNA1, DICER1, FANCC, FH, FLCN, GALNT12, KIF1B, LZTR1, MAX, MEN1, MET, MLH1, MSH2, MSH3, MSH6, MUTYH, NBN, NF1, NF2, NTHL1, PALB2, PHOX2B, PMS2, POT1, PRKAR1A, PTCH1, PTEN, RAD51C, RAD51D, RB1, RECQL, RET, SDHA, SDHAF2, SDHB, SDHC, SDHD, SMAD4, SMARCA4, SMARCB1, SMARCE1, STK11, SUFU, TMEM127, TP53, TSC1, TSC2, VHL and XRCC2 (sequencing and deletion/duplication); EGFR, EGLN1, HOXB13, KIT, MITF,  PDGFRA, POLD1, and POLE (sequencing only); EPCAM and GREM1 (deletion/duplication only).    10/03/2021 Surgery   A.   BREAST, LEFT, LUMPECTOMY:  -    Invasive carcinoma of no special type (ductal), grade 2, 18 mm in  greatest dimension, all margins negative for tumor.  -    Distance of invasive carcinoma from closest margin:         4 mm  (posterior margin).   B.   LYMPH NODE, LEFT AXILLA #1, SENTINEL, EXCISION:  -    Lymph node, negative for malignancy (0/1).   C.   LYMPH NODE, LEFT AXILLA #2, SENTINEL, EXCISION:  -    Lymph node with isolated tumor cells, metastatic deposit 0.2 mm in  greatest dimension and less than 200 cells.    10/03/2021 Cancer Staging   Staging form: Breast, AJCC 8th Edition - Pathologic stage from 10/03/2021: Stage IA (pT1c, pN0(i+), cM0, G2, ER+, PR+, HER2-) - Signed by Loa Socks, NP on 05/11/2022 Histologic grading system: 3 grade system   10/03/2021 Oncotype testing   22, distant recurrence risk at 9 years at 18%, chemotherapy benefit cannot be excluded. Given small benefit of chemotherapy, she was agreeable to move forward with radiation alone.   11/26/2021 - 12/25/2021 Radiation Therapy   Adjuvant radiation in [place].05 Gy in 25 treatments followed by boost of 10 Gy in 5 treatments    02/2022 -  Anti-estrogen oral therapy   Anastrozole daily     CURRENT THERAPY:Anastrozole  INTERVAL HISTORY:  Breanna Wilkins 62 y.o. female returns for f/u of her estrogen positive breast cancer on treatment with Anastrozole.   Discussed the use of AI scribe software for clinical note transcription  with the patient, who gave verbal consent to proceed.  History of Present Illness    The patient, with a history of breast cancer treated with surgery, radiation, and anastrozole is here for telephone visit.  She said she tried exemestane, this was worse than anastrozole.  Hence she went back on anastrozole.  She is however hoping to try something different.   She is inclined to try tamoxifen.  Otherwise her nagging headache has improved as well.  No other pains reported today.   Patient Active Problem List   Diagnosis Date Noted   Statin intolerance 04/09/2023   Viral URI with cough 12/23/2022   Preop examination 10/02/2021   Genetic testing 09/27/2021   Family history of ovarian cancer 09/12/2021   Malignant neoplasm of upper-outer quadrant of left breast in female, estrogen receptor positive (HCC) 09/10/2021   Neck mass 02/22/2021   Musculoskeletal neck pain 05/22/2020   Leg pain, posterior, left 12/21/2019   Strain of rectus abdominis muscle 09/06/2019   Sciatica of left side 11/10/2018   Meralgia paraesthetica 10/20/2018   Chronic fatigue 10/01/2018   Diverticulosis of colon 10/17/2017   Internal hemorrhoids without complication 10/17/2017   History of colonic polyps 10/17/2017   BRBPR (bright red blood per rectum) 08/25/2017   Chronic pain 07/10/2017   Long term (current) use of opiate analgesic 09/17/2016   Vitamin D deficiency 09/17/2016   Primary osteoarthritis of right knee 03/04/2016   Nipple dermatitis 11/20/2015   Prediabetes 06/07/2015   Morbid obesity with BMI of 45.0-49.9, adult (HCC) 06/07/2015   GERD (gastroesophageal reflux disease) 05/29/2014   Atypical chest pain 02/23/2014   Plantar fasciitis of right foot 02/07/2014   Carpal tunnel syndrome of right wrist 12/05/2013   Pain of left calf 11/03/2013   Preventative health care 05/12/2013   CAD (coronary artery disease), native coronary artery    CHF NYHA class II (symptoms with moderately strenuous activities) (HCC) 10/11/2012   Hyperlipidemia 08/31/2012   History of DVT (deep vein thrombosis) 03/20/2011   Long term (current) use of anticoagulants 08/09/2010   Supraspinatus tendon tear 02/11/2010   History of pulmonary embolism 01/16/2010   Severe obstructive sleep apnea 06/29/2009   Essential hypertension, benign 06/04/2009    has No Known  Allergies.  MEDICAL HISTORY: Past Medical History:  Diagnosis Date   Abdominal hernia    Arthritis    Breast cancer (HCC)    CAD (coronary artery disease)    Perioperative non-STEMI March, 2014  //   cardiac catheterization at time of non-STEMI, March, 2014, minor nonobstructive coronary disease, vigorous LV function, possibly vasospastic event versus stress cardiomyopathy. No further workup of coronary disease   CHF (congestive heart failure) (HCC)    Diverticulosis of colon 10/17/2017   Noted on colonoscopy 10/14/17   Ejection fraction    65-70%, vigorous function, echo, March, 2011   Ejection fraction    Vigorous function at time of catheterization October 06, 2012,   GERD (gastroesophageal reflux disease)    History of colonic polyps 10/17/2017   Colonoscopy 10/14/17   Hyperlipidemia    Hypertension    Internal hemorrhoids without complication 10/17/2017   Noted on colonoscopy 10/14/17   Lung nodule 12/2009   Very small, left upper lobe by CT June, 2011, one-year followup was stable   Myocardial infarction (HCC) 09/2013   OSA (obstructive sleep apnea)    CPAP   Overweight(278.02)    Pulmonary embolus (HCC) 01/16/2010   on Coumadin indefinitely   Shortness of breath  Shoulder pain    Sleep apnea    Warfarin anticoagulation     SURGICAL HISTORY: Past Surgical History:  Procedure Laterality Date   ABDOMINAL HYSTERECTOMY  07/2002   laparoscopic assisted vaginal hysterectomy for menorrhagia, dysmenorrhea, anemia, fibroids   BREAST BIOPSY Left 09/03/2021   BREAST LUMPECTOMY     BREAST LUMPECTOMY WITH RADIOACTIVE SEED AND SENTINEL LYMPH NODE BIOPSY Left 10/03/2021   Procedure: LEFT BREAST LUMPECTOMY WITH RADIOACTIVE SEED AND SENTINEL LYMPH NODE BIOPSY;  Surgeon: Almond Lint, MD;  Location: [facility] OR;  Service: General;  Laterality: Left;   COLOSTOMY     INCISIONAL HERNIA REPAIR N/A 09/27/2012   Procedure: LAPAROSCOPIC INCISIONAL HERNIA possible open;  Surgeon: Ernestene Mention, MD;  Location: [facility] OR;  Service: General;  Laterality: N/A;   INSERTION OF MESH N/A 09/27/2012   Procedure: INSERTION OF MESH;  Surgeon: Ernestene Mention, MD;  Location: [facility] OR;  Service: General;  Laterality: N/A;   KNEE ARTHROSCOPY Left    LEFT HEART CATHETERIZATION WITH CORONARY ANGIOGRAM N/A 10/06/2012   Procedure: LEFT HEART CATHETERIZATION WITH CORONARY ANGIOGRAM;  Surgeon: Tonny Bollman, MD;  Location: [facility] CATH LAB;  Service: Cardiovascular;  Laterality: N/A;    SOCIAL HISTORY: Social History   Socioeconomic History   Marital status: Single    Spouse name: Not on file   Number of children: 0   Years of education: 12   Highest education level: 10th grade  Occupational History   Occupation: Disabled  Tobacco Use   Smoking status: Former    Current packs/day: 0.00    Types: Cigarettes    Quit date: 05/03/1988    Years since quitting: 35.1    Passive exposure: Never   Smokeless tobacco: Never  Vaping Use   Vaping status: Never Used  Substance and Sexual Activity   Alcohol use: Yes    Alcohol/week: 1.0 standard drink of alcohol    Types: 1 Cans of beer per week    Comment: Wine sometimes.   Drug use: Never   Sexual activity: Not Currently  Other Topics Concern   Not on file  Social History Narrative   Financial assistance approved for 100% discount at [facility] and has [facility] card per [place]6/01/2010      Current Social History 02/11/2021        Patient lives with sister in a home  that is 1 story. There are 4 steps up to the entrance the patient uses. There is a railing.       Patient's method of transportation is personal car.      The highest level of education was some high school.      The patient currently is employed as aid on school bus.      Identified important Relationships are "my sister"       Pets : 0       Interests / Fun: "I like to fish and I love my job"       Current Stressors: "none"       Religious / Personal Beliefs: "I don't know"         Social Determinants of Health   Financial Resource Strain: Low Risk  (04/08/2023)   Overall Financial Resource Strain (CARDIA)    Difficulty of Paying Living Expenses: Not hard at all  Food Insecurity: No Food Insecurity (04/08/2023)   Hunger Vital Sign    Worried About Running Out of Food in the Last Year: Never true    Ran Out of  Food in the Last Year: Never true  Transportation Needs: No Transportation Needs (04/08/2023)   PRAPARE - Administrator, Civil Service (Medical): No    Lack of Transportation (Non-Medical): No  Physical Activity: Insufficiently Active (04/08/2023)   Exercise Vital Sign    Days of Exercise per Week: 3 days    Minutes of Exercise per Session: 20 min  Stress: No Stress Concern Present (04/08/2023)   [employer] of Occupational Health - Occupational Stress Questionnaire    Feeling of Stress : Not at all  Social Connections: Socially Isolated (04/08/2023)   Social Connection and Isolation Panel [NHANES]    Frequency of Communication with Friends and Family: More than three times a week    Frequency of Social Gatherings with Friends and Family: Once a week    Attends Religious Services: Never    Database administrator or Organizations: No    Attends Banker Meetings: Never    Marital Status: Never married  Intimate Partner Violence: Not At Risk (07/03/2021)   Humiliation, Afraid, Rape, and Kick questionnaire    Fear of Current or Ex-Partner: No    Emotionally Abused: No    Physically Abused: No    Sexually Abused: No    FAMILY HISTORY: Family History  Problem Relation Age of Onset   Diabetes Mother    Ovarian cancer Mother 49   Diabetes Father    Diabetes Sister    Colon polyps Sister    Brain cancer Maternal Aunt        unsure if it was a brain primary or cancer that metastasized to the brain   Cancer Paternal Uncle        unknown type   Bone cancer Maternal Grandmother        unsure if it was a bone primary or  cancer that metastasized to the bones   Breast cancer Neg Hx    Colon cancer Neg Hx    Esophageal cancer Neg Hx    Rectal cancer Neg Hx    Stomach cancer Neg Hx     Review of Systems  Constitutional:  Negative for appetite change, chills, fatigue, fever and unexpected weight change.  HENT:   Negative for hearing loss, lump/mass and trouble swallowing.   Eyes:  Negative for eye problems and icterus.  Respiratory:  Negative for chest tightness, cough and shortness of breath.   Cardiovascular:  Negative for chest pain, leg swelling and palpitations.  Gastrointestinal:  Negative for abdominal distention, abdominal pain, constipation, diarrhea, nausea and vomiting.  Endocrine: Negative for hot flashes.  Genitourinary:  Negative for difficulty urinating.   Musculoskeletal:  Negative for arthralgias.  Skin:  Negative for itching and rash.  Neurological:  Negative for dizziness, extremity weakness, headaches and numbness.  Hematological:  Negative for adenopathy. Does not bruise/bleed easily.  Psychiatric/Behavioral:  Negative for depression. The patient is not nervous/anxious.       PHYSICAL EXAMINATION    There were no vitals filed for this visit.    LABORATORY DATA: None for this visit   ASSESSMENT and THERAPY PLAN:   Malignant neoplasm of upper-outer quadrant of left breast in female, estrogen receptor positive (HCC) Breanna Wilkins is a 62 year old woman with history of stage Ia ER/PR positive invasive ductal carcinoma of the left breast.  She is s/p lumpectomy, adjuvant radiation, and antiestrogen therapy with Anastrozole which began in 02/2022.  Breast Cancer on Anastrozole Severe bone and joint pain, possibly related to Anastrozole.  She has  tried exemestane, this did not help her bone pains as well hence she would like to try something different.  We have today discussed about tamoxifen, [place] action, adverse effects including but not limited to menopausal symptoms, risk of  DVT/PE (she is already on anticoagulation), endometrial hyperplasia which does not apply to her since she had hysterectomy and improvement in bone density. She is willing to try this, prescription dispensed to the pharmacy of her choice.  She was encouraged to call if she does not tolerate this well.  She will otherwise return to clinic as scheduled.  All questions were answered. The patient knows to call the clinic with any problems, questions or concerns. We can certainly see the patient much sooner if necessary.  Total encounter time:30 minutes*in face-to-face visit time, chart review, lab review, care coordination, order entry, and documentation of the encounter time.  *Total Encounter Time as defined by the Centers for Medicare and Medicaid Services includes, in addition to the face-to-face time of a patient visit (documented in the note above) non-face-to-face time: obtaining and reviewing outside history, ordering and reviewing medications, tests or procedures, care coordination (communications with other health care professionals or caregivers) and documentation in the medical record.

## 2023-06-29 ENCOUNTER — Other Ambulatory Visit: Payer: Self-pay

## 2023-06-29 ENCOUNTER — Telehealth: Payer: Self-pay | Admitting: *Deleted

## 2023-06-29 NOTE — Telephone Encounter (Signed)
Returned call to patient. She reports small amount blood tinged vaginal discharge after starting Tamoxifen end of November. She discussed with pharmacist who informed her that could be a side effect of Tamoxifen and advised her to contact Dr. Al Pimple.  Breanna Wilkins said she was letting Dr. Al Pimple know and what Dr. Al Pimple wants her to do.  Patient states she stopped taking the Tamoxifen Saturday after talking with pharmacist.   She then restarted Anastrozole that she had been on previously. She said she didn't like the bone aches with Anastrozole, but did not want to be off all meds.Patient reports she has about 25 Anastrozole left from previous prescription

## 2023-06-29 NOTE — Telephone Encounter (Signed)
Per Dr. Al Pimple - patient to be seen in office in approx 3 weeks. Scheduling informed. Contacted patient to let her know Dr. Al Pimple was given information and asked to see her in about 3 weeks. CC Scheduling will contact her to schedule appt. Patient verbalized understanding.

## 2023-06-29 NOTE — Telephone Encounter (Signed)
Looks like last fill appropriately was 11/30.  Was she just wanting to make sure she gets her next refill on time 12/27-28 (given the holidays) or has she run out sooner?

## 2023-06-30 ENCOUNTER — Telehealth: Payer: Self-pay | Admitting: Hematology and Oncology

## 2023-06-30 DIAGNOSIS — M1711 Unilateral primary osteoarthritis, right knee: Secondary | ICD-10-CM | POA: Diagnosis not present

## 2023-06-30 MED ORDER — OXYCODONE-ACETAMINOPHEN 10-325 MG PO TABS: 1.0000 | ORAL_TABLET | ORAL | 0 refills | Status: DC | PRN

## 2023-06-30 NOTE — Telephone Encounter (Signed)
 Left patient a vm regarding upcoming appointment

## 2023-06-30 NOTE — Telephone Encounter (Signed)
OK great I have sent in the refill for 12/27

## 2023-06-30 NOTE — Telephone Encounter (Signed)
She was just wanting to make sure it was done due to the holidays

## 2023-07-03 ENCOUNTER — Telehealth: Payer: Self-pay | Admitting: *Deleted

## 2023-07-03 NOTE — Telephone Encounter (Signed)
This RN returned call to pt per her message today - obtained her VM- message left informing we will try to reach each other on Monday.

## 2023-07-07 ENCOUNTER — Telehealth: Payer: Self-pay | Admitting: *Deleted

## 2023-07-07 DIAGNOSIS — G4733 Obstructive sleep apnea (adult) (pediatric): Secondary | ICD-10-CM | POA: Diagnosis not present

## 2023-07-08 ENCOUNTER — Telehealth: Payer: Self-pay | Admitting: *Deleted

## 2023-07-08 MED ORDER — FLUCONAZOLE 150 MG PO TABS: 150.0000 mg | ORAL_TABLET | ORAL | 0 refills | Status: AC

## 2023-07-08 NOTE — Telephone Encounter (Signed)
Call from patient states she thinks she has a yeast infection and swelling in the vaginal area. Said that problem started when she was on Tamoxifen by her Cancer doctor.  Has since stopped taking the medication.  Call to the Cancer Center message was left for the office to call patient to see what she will need to do.  Will forward to patient's PCP to see if medication for the yeast infection can be ordered for patient.

## 2023-07-08 NOTE — Telephone Encounter (Signed)
Call to patient informed her that Dr. Mikey Bussing has sent in some medication for a yeast infection and to call us back if this does not help.  Patient voiced understanding of.

## 2023-07-08 NOTE — Telephone Encounter (Signed)
I sent in a yeast infection medication for her.  Please have her let me know if it does not get better

## 2023-07-09 DIAGNOSIS — G4733 Obstructive sleep apnea (adult) (pediatric): Secondary | ICD-10-CM | POA: Diagnosis not present

## 2023-07-17 NOTE — Telephone Encounter (Signed)
No entry 

## 2023-07-20 ENCOUNTER — Other Ambulatory Visit (HOSPITAL_COMMUNITY)
Admission: RE | Admit: 2023-07-20 | Discharge: 2023-07-20 | Disposition: A | Discharge status: Home / Self Care | Payer: 59 | Source: Ambulatory Visit | Attending: Internal Medicine | Admitting: Internal Medicine

## 2023-07-20 ENCOUNTER — Ambulatory Visit (INDEPENDENT_AMBULATORY_CARE_PROVIDER_SITE_OTHER): Payer: 59 | Admitting: Internal Medicine

## 2023-07-20 VITALS — BP 147/97 | HR 78 | Temp 97.8°F | Ht 67.0 in | Wt 289.0 lb

## 2023-07-20 DIAGNOSIS — R221 Localized swelling, mass and lump, neck: Secondary | ICD-10-CM

## 2023-07-20 DIAGNOSIS — E785 Hyperlipidemia, unspecified: Secondary | ICD-10-CM

## 2023-07-20 DIAGNOSIS — Z6841 Body Mass Index (BMI) 40.0 and over, adult: Secondary | ICD-10-CM

## 2023-07-20 DIAGNOSIS — R300 Dysuria: Secondary | ICD-10-CM | POA: Insufficient documentation

## 2023-07-20 DIAGNOSIS — E7849 Other hyperlipidemia: Secondary | ICD-10-CM | POA: Diagnosis not present

## 2023-07-20 DIAGNOSIS — I10 Essential (primary) hypertension: Secondary | ICD-10-CM

## 2023-07-20 LAB — CERVICOVAGINAL ANCILLARY ONLY
Bacterial Vaginitis (gardnerella): NEGATIVE
Candida Glabrata: NEGATIVE
Candida Vaginitis: NEGATIVE
Comment: NEGATIVE
Comment: NEGATIVE
Comment: NEGATIVE

## 2023-07-20 LAB — POCT URINALYSIS DIPSTICK
Blood, UA: NEGATIVE
Glucose, UA: NEGATIVE
Ketones, UA: NEGATIVE
Leukocytes, UA: NEGATIVE
Nitrite, UA: NEGATIVE
Protein, UA: POSITIVE — AB
Spec Grav, UA: >=1.03 — AB (ref 1.010–1.025)
Urobilinogen, UA: 1 U/dL
pH, UA: 5.5 (ref 5.0–8.0)

## 2023-07-20 NOTE — Assessment & Plan Note (Signed)
BP 132/94. OP regimen is carvedilol 6.25 mg BID, furosemide 20 mg daily, olmesartan 40 mg daily which she is compliant with. She did take her medications this morning but has consumed an energy drink and thinks her BP is elevated as a result. BMP last checked 02/2023 with normal renal function and electrolytes. Plan:Continue current regimen. Advised to check her BP at home and bring in a log to her next OV.

## 2023-07-20 NOTE — Progress Notes (Signed)
CC: dysuria, routine f/u  HPI:  [name] is a 62 y.o. female with past medical history as detailed below who presents for routine f/u and with complaint of dysuria. Please see problem based charting for detailed assessment and plan.  Past Medical History:  Diagnosis Date   Abdominal hernia    Arthritis    Breast cancer (HCC)    CAD (coronary artery disease)    Perioperative non-STEMI March, 2014  //   cardiac catheterization at time of non-STEMI, March, 2014, minor nonobstructive coronary disease, vigorous LV function, possibly vasospastic event versus stress cardiomyopathy. No further workup of coronary disease   CHF (congestive heart failure) (HCC)    Diverticulosis of colon 10/17/2017   Noted on colonoscopy 10/14/17   Ejection fraction    65-70%, vigorous function, echo, March, 2011   Ejection fraction    Vigorous function at time of catheterization October 06, 2012,   GERD (gastroesophageal reflux disease)    History of colonic polyps 10/17/2017   Colonoscopy 10/14/17   Hyperlipidemia    Hypertension    Internal hemorrhoids without complication 10/17/2017   Noted on colonoscopy 10/14/17   Lung nodule 12/2009   Very small, left upper lobe by CT June, 2011, one-year followup was stable   Myocardial infarction (HCC) 09/2013   OSA (obstructive sleep apnea)    CPAP   Overweight(278.02)    Pulmonary embolus (HCC) 01/16/2010   on Coumadin indefinitely   Shortness of breath    Shoulder pain    Sleep apnea    Warfarin anticoagulation    Review of Systems:  Negative unless otherwise stated.  Physical Exam:  Vitals:   07/20/23 1313 07/20/23 1332  BP: (!) 132/94 (!) 147/97  Pulse: 90 78  Temp: 97.8 F (36.6 C)   TempSrc: Oral   SpO2: 99%   Weight: 289 lb (131.1 kg)   Height: 5\' 7"  (1.702 m)    Physical Exam Constitutional:      General: She is not in acute distress.    Appearance: Normal appearance. She is not ill-appearing.  Neck:      Comments: R sided  ~2 cm mobile nontender mass. Cardiovascular:     Rate and Rhythm: Normal rate and regular rhythm.  Pulmonary:     Breath sounds: Normal breath sounds.  Abdominal:     General: Abdomen is flat.     Palpations: Abdomen is soft.     Tenderness: There is no abdominal tenderness.  Musculoskeletal:     Right lower leg: No edema.     Left lower leg: No edema.  Skin:    General: Skin is warm and dry.  Neurological:     General: No focal deficit present.     Mental Status: She is alert and oriented to person, place, and time.  Psychiatric:        Mood and Affect: Mood normal.    Assessment & Plan:   See Encounters Tab for problem based charting.  Morbid obesity with BMI of 45.0-49.9, adult (HCC) Had NSTEMI in 2014. OP regimen is ASA 81 mg daily. She was started on wegovy but has stopped it due to concern that it is causing her to have yeast infections. She says that she will increase her exercise frequency for her weight loss.  Essential hypertension, benign BP 132/94. OP regimen is carvedilol 6.25 mg BID, furosemide 20 mg daily, olmesartan 40 mg daily which she is compliant with. She did take her medications this morning but has  consumed an energy drink and thinks her BP is elevated as a result. BMP last checked 02/2023 with normal renal function and electrolytes. Plan:Continue current regimen. Advised to check her BP at home and bring in a log to her next OV.  Hyperlipidemia OP regimen is zetia 10 mg daily which she is compliant with. Lipid panel last checked 02/2023 with LDL above goal, 140. Plan:Lipid panel today. Continue current regimen. May need to send in bempedoic acid.  Dysuria Patient complains of recurrent dysuria. She was treated with fluconazole earlier this month for similar symptoms and notes improvement however she gave herself her next dose of Wegovy and the symptoms have returned. She is having burning with urination. She was unable to provide a substantial urine sample  for testing but the lab was able to do a dipstick which did not show nitrites or leukocytes. Plan:Will obtain vaginal swab for candida and send urine for culture, plan to treat if positive for any infection.  Neck mass Chronic, has recently undergone CT imaging to further evaluate which did not show lymphadenopathy, pharynx or larynx mass or inflammatory disease, and no thyroid abnormalities. The mass is ~2 cm, mobile, non painful. Her oncologist is aware and monitoring; next OV 01/03. She thinks that the mass flares after she gives herself 62. Plan:Continue to monitor.  Patient discussed with Dr.  Lafonda Mosses

## 2023-07-20 NOTE — Assessment & Plan Note (Signed)
Patient complains of recurrent dysuria. She was treated with fluconazole earlier this month for similar symptoms and notes improvement however she gave herself her next dose of Wegovy and the symptoms have returned. She is having burning with urination. She was unable to provide a substantial urine sample for testing but the lab was able to do a dipstick which did not show nitrites or leukocytes. Plan:Will obtain vaginal swab for candida and send urine for culture, plan to treat if positive for any infection.

## 2023-07-20 NOTE — Assessment & Plan Note (Signed)
OP regimen is zetia 10 mg daily which she is compliant with. Lipid panel last checked 02/2023 with LDL above goal, 140. Plan:Lipid panel today. Continue current regimen. May need to send in bempedoic acid.

## 2023-07-20 NOTE — Assessment & Plan Note (Signed)
Chronic, has recently undergone CT imaging to further evaluate which did not show lymphadenopathy, pharynx or larynx mass or inflammatory disease, and no thyroid abnormalities. The mass is ~2 cm, mobile, non painful. Her oncologist is aware and monitoring; next OV 01/03. She thinks that the mass flares after she gives herself 22. Plan:Continue to monitor.

## 2023-07-20 NOTE — Patient Instructions (Addendum)
It was a pleasure to care for you today!  I will let you know what your lab results show and if any medication changes need to be made.  Please check your blood pressure at home and bring a log of these readings to your next visit in March.  My best, Dr. August Saucer

## 2023-07-20 NOTE — Assessment & Plan Note (Signed)
Had NSTEMI in 2014. OP regimen is ASA 81 mg daily. She was started on wegovy but has stopped it due to concern that it is causing her to have yeast infections. She says that she will increase her exercise frequency for her weight loss.

## 2023-07-20 NOTE — Addendum Note (Signed)
Addended by: Maura Crandall on: 07/20/2023 03:42 PM   Modules accepted: Orders

## 2023-07-21 DIAGNOSIS — M1712 Unilateral primary osteoarthritis, left knee: Secondary | ICD-10-CM | POA: Diagnosis not present

## 2023-07-21 LAB — URINE CULTURE

## 2023-07-21 LAB — LIPID PANEL
Chol/HDL Ratio: 4.8 {ratio} — ABNORMAL HIGH (ref 0.0–4.4)
Cholesterol, Total: 197 mg/dL (ref 100–199)
HDL: 41 mg/dL (ref >39)
LDL Chol Calc (NIH): 135 mg/dL — ABNORMAL HIGH (ref 0–99)
Triglycerides: 114 mg/dL (ref 0–149)
VLDL Cholesterol Cal: 21 mg/dL (ref 5–40)

## 2023-07-23 ENCOUNTER — Telehealth: Payer: Self-pay | Admitting: *Deleted

## 2023-07-23 ENCOUNTER — Other Ambulatory Visit: Payer: Self-pay | Admitting: Hematology and Oncology

## 2023-07-23 DIAGNOSIS — Z853 Personal history of malignant neoplasm of breast: Secondary | ICD-10-CM

## 2023-07-23 DIAGNOSIS — C50912 Malignant neoplasm of unspecified site of left female breast: Secondary | ICD-10-CM | POA: Diagnosis not present

## 2023-07-23 NOTE — Telephone Encounter (Signed)
 Called to follow up on yeast infection. Pt states she took the pills prescribed and infection is better. States she took 2 Wegovy shots and is not going to take them again. "They messed me all up, made me burn down there and a rash on my neck".

## 2023-07-24 ENCOUNTER — Other Ambulatory Visit: Payer: Self-pay | Admitting: Internal Medicine

## 2023-07-24 ENCOUNTER — Inpatient Hospital Stay: Payer: 59 | Attending: Hematology and Oncology | Admitting: Hematology and Oncology

## 2023-07-24 VITALS — BP 157/79 | HR 72 | Temp 97.7°F | Resp 16 | Wt 291.9 lb

## 2023-07-24 DIAGNOSIS — Z1721 Progesterone receptor positive status: Secondary | ICD-10-CM | POA: Diagnosis not present

## 2023-07-24 DIAGNOSIS — Z1732 Human epidermal growth factor receptor 2 negative status: Secondary | ICD-10-CM | POA: Diagnosis not present

## 2023-07-24 DIAGNOSIS — Z789 Other specified health status: Secondary | ICD-10-CM

## 2023-07-24 DIAGNOSIS — Z79811 Long term (current) use of aromatase inhibitors: Secondary | ICD-10-CM | POA: Insufficient documentation

## 2023-07-24 DIAGNOSIS — Z170 Estrogen receptor positive status [ER+]: Secondary | ICD-10-CM | POA: Diagnosis not present

## 2023-07-24 DIAGNOSIS — Z923 Personal history of irradiation: Secondary | ICD-10-CM | POA: Diagnosis not present

## 2023-07-24 DIAGNOSIS — C50412 Malignant neoplasm of upper-outer quadrant of left female breast: Secondary | ICD-10-CM | POA: Diagnosis not present

## 2023-07-24 DIAGNOSIS — E7849 Other hyperlipidemia: Secondary | ICD-10-CM

## 2023-07-24 MED ORDER — BEMPEDOIC ACID 180 MG PO TABS: 180.0000 mg | ORAL_TABLET | Freq: Every day | ORAL | 2 refills | Status: DC

## 2023-07-24 NOTE — Assessment & Plan Note (Signed)
 Breanna Wilkins is a 63 year old woman with history of stage Ia ER/PR positive invasive ductal carcinoma of the left breast.  She is s/p lumpectomy, adjuvant radiation, and antiestrogen therapy with Anastrozole  which began in 02/2022.  Breast Cancer status postsurgery and radiation She has already tried tamoxifen , anastrozole  and exemestane .  She does not want to try any other antiestrogen therapy.  She had severe arthralgias and had to use a walker/cane while she was taking these medications.  She stopped them 2 weeks ago and she feels a whole lot better, sometimes forgets that she needs a cane.  She is willing to proceed with observation alone.  She has a mammogram coming up on January 25.  Self breast exam recommended monthly.  We will see her back in 6 months.  I will review the mammogram results and discussed with her if necessary.  She understands the importance of antiestrogen therapy but given the quality of life on his medications, she is willing to proceed with surveillance alone.  Encouraged activity, regular exercise and healthy diet.

## 2023-07-24 NOTE — Progress Notes (Signed)
 [facility] Cancer Follow up:    Breanna Dayton BROCKS, DO [address] [place] [address]   DIAGNOSIS:  Cancer Staging  Malignant neoplasm of upper-outer quadrant of left breast in female, estrogen receptor positive (HCC) Staging form: Breast, AJCC 8th Edition - Pathologic stage from 10/03/2021: Stage IA (pT1c, pN0(i+), cM0, G2, ER+, PR+, HER2-) - Signed by Crawford Morna Pickle, NP on 05/11/2022 Histologic grading system: 3 grade system   SUMMARY OF ONCOLOGIC HISTORY: Oncology History  Malignant neoplasm of upper-outer quadrant of left breast in female, estrogen receptor positive (HCC)  09/03/2021 Initial Biopsy   Left breast biopsy: IDC, grade 1-2 of 3 ,0.9 cm in greatest linear extent.  ER 100% positive, moderate staining intensity, PR 100% positive, strong staining intensity, KI of less than 5%.  HER-2 IHC equivocal (2+), FISH negative.     Genetic Testing   [facility] CancerNext-Expanded panel was Negative. Of note, a variant of uncertain significance was detected in the BRCA1 gene (p.I429T). Report date is 09/30/2021.   The CancerNext-Expanded gene panel offered by [facility] and includes sequencing, rearrangement, and RNA analysis for the following 77 genes: AIP, ALK, APC, ATM, AXIN2, BAP1, BARD1, BLM, BMPR1A, BRCA1, BRCA2, BRIP1, CDC73, CDH1, CDK4, CDKN1B, CDKN2A, CHEK2, CTNNA1, DICER1, FANCC, FH, FLCN, GALNT12, KIF1B, LZTR1, MAX, MEN1, MET, MLH1, MSH2, MSH3, MSH6, MUTYH, NBN, NF1, NF2, NTHL1, PALB2, PHOX2B, PMS2, POT1, PRKAR1A, PTCH1, PTEN, RAD51C, RAD51D, RB1, RECQL, RET, SDHA, SDHAF2, SDHB, SDHC, SDHD, SMAD4, SMARCA4, SMARCB1, SMARCE1, STK11, SUFU, TMEM127, TP53, TSC1, TSC2, VHL and XRCC2 (sequencing and deletion/duplication); EGFR, EGLN1, HOXB13, KIT, MITF, PDGFRA, POLD1, and POLE (sequencing only); EPCAM and GREM1 (deletion/duplication only).    10/03/2021 Surgery   A.   BREAST, LEFT, LUMPECTOMY:  -    Invasive carcinoma of no special type (ductal), grade 2, 18  mm in  greatest dimension, all margins negative for tumor.  -    Distance of invasive carcinoma from closest margin:         4 mm  (posterior margin).   B.   LYMPH NODE, LEFT AXILLA #1, SENTINEL, EXCISION:  -    Lymph node, negative for malignancy (0/1).   C.   LYMPH NODE, LEFT AXILLA #2, SENTINEL, EXCISION:  -    Lymph node with isolated tumor cells, metastatic deposit 0.2 mm in  greatest dimension and less than 200 cells.    10/03/2021 Cancer Staging   Staging form: Breast, AJCC 8th Edition - Pathologic stage from 10/03/2021: Stage IA (pT1c, pN0(i+), cM0, G2, ER+, PR+, HER2-) - Signed by Crawford Morna Pickle, NP on 05/11/2022 Histologic grading system: 3 grade system   10/03/2021 Oncotype testing   22, distant recurrence risk at 9 years at 18%, chemotherapy benefit cannot be excluded. Given small benefit of chemotherapy, she was agreeable to move forward with radiation alone.   11/26/2021 - 12/25/2021 Radiation Therapy   Adjuvant radiation in [place].05 Gy in 25 treatments followed by boost of 10 Gy in 5 treatments    02/2022 -  Anti-estrogen oral therapy   Anastrozole  daily     CURRENT THERAPY:Anastrozole   INTERVAL HISTORY:  Breanna Wilkins 63 y.o. female returns for f/u of her estrogen positive breast cancer on treatment with Anastrozole .   Discussed the use of AI scribe software for clinical note transcription with the patient, who gave verbal consent to proceed.  History of Present Illness    The patient, with a history of breast cancer treated with surgery, radiation, currently not on  any antiestrogen therapy who is here for a follow-up.  The patient presents with a swollen left upper thigh that has been ongoing for months. The swelling is not associated with pain. She has also been experiencing side effects from her medications, including a knot in her stomach and yeast infections from Ozempic , and general malaise from her breast cancer medications. Since discontinuing  these medications, she reports feeling better. She is currently taking aspirin , carvedilol , vitamin D , Voltaren  gel, Zetia , Lasix , Benicar , Oxycontin , Oxycodone , Miralax , and Xarelto . She has a mammogram scheduled for later this month.  She has already tried tamoxifen , anastrozole  and exemestane .  She is reluctant to try anything different.  She states the medications did not work well for her, she had severe arthralgias while on these medicines and she is hoping to just proceed with surveillance alone.  Patient Active Problem List   Diagnosis Date Noted   Dysuria 07/20/2023   Statin intolerance 04/09/2023   Preop examination 10/02/2021   Genetic testing 09/27/2021   Family history of ovarian cancer 09/12/2021   Malignant neoplasm of upper-outer quadrant of left breast in female, estrogen receptor positive (HCC) 09/10/2021   Neck mass 02/22/2021   Musculoskeletal neck pain 05/22/2020   Leg pain, posterior, left 12/21/2019   Strain of rectus abdominis muscle 09/06/2019   Sciatica of left side 11/10/2018   Meralgia paraesthetica 10/20/2018   Chronic fatigue 10/01/2018   Diverticulosis of colon 10/17/2017   Internal hemorrhoids without complication 10/17/2017   History of colonic polyps 10/17/2017   BRBPR (bright red blood per rectum) 08/25/2017   Chronic pain 07/10/2017   Long term (current) use of opiate analgesic 09/17/2016   Vitamin D  deficiency 09/17/2016   Primary osteoarthritis of right knee 03/04/2016   Nipple dermatitis 11/20/2015   Prediabetes 06/07/2015   Morbid obesity with BMI of 45.0-49.9, adult (HCC) 06/07/2015   GERD (gastroesophageal reflux disease) 05/29/2014   Atypical chest pain 02/23/2014   Plantar fasciitis of right foot 02/07/2014   Carpal tunnel syndrome of right wrist 12/05/2013   Pain of left calf 11/03/2013   CAD (coronary artery disease), native coronary artery    CHF NYHA class II (symptoms with moderately strenuous activities) (HCC) 10/11/2012    Hyperlipidemia 08/31/2012   History of DVT (deep vein thrombosis) 03/20/2011   Long term (current) use of anticoagulants 08/09/2010   Supraspinatus tendon tear 02/11/2010   History of pulmonary embolism 01/16/2010   Severe obstructive sleep apnea 06/29/2009   Essential hypertension, benign 06/04/2009    has no known allergies.  MEDICAL HISTORY: Past Medical History:  Diagnosis Date   Abdominal hernia    Arthritis    Breast cancer (HCC)    CAD (coronary artery disease)    Perioperative non-STEMI March, 2014  //   cardiac catheterization at time of non-STEMI, March, 2014, minor nonobstructive coronary disease, vigorous LV function, possibly vasospastic event versus stress cardiomyopathy. No further workup of coronary disease   CHF (congestive heart failure) (HCC)    Diverticulosis of colon 10/17/2017   Noted on colonoscopy 10/14/17   Ejection fraction    65-70%, vigorous function, echo, March, 2011   Ejection fraction    Vigorous function at time of catheterization October 06, 2012,   GERD (gastroesophageal reflux disease)    History of colonic polyps 10/17/2017   Colonoscopy 10/14/17   Hyperlipidemia    Hypertension    Internal hemorrhoids without complication 10/17/2017   Noted on colonoscopy 10/14/17   Lung nodule 12/2009   Very small, left  upper lobe by CT June, 2011, one-year followup was stable   Myocardial infarction (HCC) 09/2013   OSA (obstructive sleep apnea)    CPAP   Overweight(278.02)    Pulmonary embolus (HCC) 01/16/2010   on Coumadin  indefinitely   Shortness of breath    Shoulder pain    Sleep apnea    Warfarin anticoagulation     SURGICAL HISTORY: Past Surgical History:  Procedure Laterality Date   ABDOMINAL HYSTERECTOMY  07/2002   laparoscopic assisted vaginal hysterectomy for menorrhagia, dysmenorrhea, anemia, fibroids   BREAST BIOPSY Left 09/03/2021   BREAST LUMPECTOMY     BREAST LUMPECTOMY WITH RADIOACTIVE SEED AND SENTINEL LYMPH NODE BIOPSY Left  10/03/2021   Procedure: LEFT BREAST LUMPECTOMY WITH RADIOACTIVE SEED AND SENTINEL LYMPH NODE BIOPSY;  Surgeon: Aron Shoulders, MD;  Location: [facility] OR;  Service: General;  Laterality: Left;   COLOSTOMY     INCISIONAL HERNIA REPAIR N/A 09/27/2012   Procedure: LAPAROSCOPIC INCISIONAL HERNIA possible open;  Surgeon: Elon CHRISTELLA Pacini, MD;  Location: [facility] OR;  Service: General;  Laterality: N/A;   INSERTION OF MESH N/A 09/27/2012   Procedure: INSERTION OF MESH;  Surgeon: Elon CHRISTELLA Pacini, MD;  Location: [facility] OR;  Service: General;  Laterality: N/A;   KNEE ARTHROSCOPY Left    LEFT HEART CATHETERIZATION WITH CORONARY ANGIOGRAM N/A 10/06/2012   Procedure: LEFT HEART CATHETERIZATION WITH CORONARY ANGIOGRAM;  Surgeon: Ozell Fell, MD;  Location: [facility] CATH LAB;  Service: Cardiovascular;  Laterality: N/A;    SOCIAL HISTORY: Social History   Socioeconomic History   Marital status: Single    Spouse name: Not on file   Number of children: 0   Years of education: 12   Highest education level: 10th grade  Occupational History   Occupation: Disabled  Tobacco Use   Smoking status: Former    Current packs/day: 0.00    Types: Cigarettes    Quit date: 05/03/1988    Years since quitting: 35.2    Passive exposure: Never   Smokeless tobacco: Never  Vaping Use   Vaping status: Never Used  Substance and Sexual Activity   Alcohol use: Yes    Alcohol/week: 1.0 standard drink of alcohol    Types: 1 Cans of beer per week    Comment: Wine sometimes.   Drug use: Never   Sexual activity: Not Currently  Other Topics Concern   Not on file  Social History Narrative   Financial assistance approved for 100% discount at [facility] and has [facility] card per Barnie Hill6/01/2010      Current Social History 02/11/2021        Patient lives with sister in a home  that is 1 story. There are 4 steps up to the entrance the patient uses. There is a railing.       Patient's method of transportation is personal car.      The highest  level of education was some high school.      The patient currently is employed as aid on school bus.      Identified important Relationships are my sister       Pets : 0       Interests / Fun: I like to fish and I love my job       Current Stressors: none       Religious / Personal Beliefs: I don't know        Social Drivers of Health   Financial Resource Strain: Low Risk  (04/08/2023)   Overall  Financial Resource Strain (CARDIA)    Difficulty of Paying Living Expenses: Not hard at all  Food Insecurity: No Food Insecurity (04/08/2023)   Hunger Vital Sign    Worried About Running Out of Food in the Last Year: Never true    Ran Out of Food in the Last Year: Never true  Transportation Needs: No Transportation Needs (04/08/2023)   PRAPARE - Administrator, Civil Service (Medical): No    Lack of Transportation (Non-Medical): No  Physical Activity: Insufficiently Active (04/08/2023)   Exercise Vital Sign    Days of Exercise per Week: 3 days    Minutes of Exercise per Session: 20 min  Stress: No Stress Concern Present (04/08/2023)   [employer] of Occupational Health - Occupational Stress Questionnaire    Feeling of Stress : Not at all  Social Connections: Socially Isolated (04/08/2023)   Social Connection and Isolation Panel [NHANES]    Frequency of Communication with Friends and Family: More than three times a week    Frequency of Social Gatherings with Friends and Family: Once a week    Attends Religious Services: Never    Database Administrator or Organizations: No    Attends Banker Meetings: Never    Marital Status: Never married  Intimate Partner Violence: Not At Risk (07/03/2021)   Humiliation, Afraid, Rape, and Kick questionnaire    Fear of Current or Ex-Partner: No    Emotionally Abused: No    Physically Abused: No    Sexually Abused: No    FAMILY HISTORY: Family History  Problem Relation Age of Onset   Diabetes Mother     Ovarian cancer Mother 75   Diabetes Father    Diabetes Sister    Colon polyps Sister    Brain cancer Maternal Aunt        unsure if it was a brain primary or cancer that metastasized to the brain   Cancer Paternal Uncle        unknown type   Bone cancer Maternal Grandmother        unsure if it was a bone primary or cancer that metastasized to the bones   Breast cancer Neg Hx    Colon cancer Neg Hx    Esophageal cancer Neg Hx    Rectal cancer Neg Hx    Stomach cancer Neg Hx     Review of Systems  Constitutional:  Negative for appetite change, chills, fatigue, fever and unexpected weight change.  HENT:   Negative for hearing loss, lump/mass and trouble swallowing.   Eyes:  Negative for eye problems and icterus.  Respiratory:  Negative for chest tightness, cough and shortness of breath.   Cardiovascular:  Negative for chest pain, leg swelling and palpitations.  Gastrointestinal:  Negative for abdominal distention, abdominal pain, constipation, diarrhea, nausea and vomiting.  Endocrine: Negative for hot flashes.  Genitourinary:  Negative for difficulty urinating.   Musculoskeletal:  Negative for arthralgias.  Skin:  Negative for itching and rash.  Neurological:  Negative for dizziness, extremity weakness, headaches and numbness.  Hematological:  Negative for adenopathy. Does not bruise/bleed easily.  Psychiatric/Behavioral:  Negative for depression. The patient is not nervous/anxious.       PHYSICAL EXAMINATION    Vitals:   07/24/23 0819  BP: (!) 157/79  Pulse: 72  Resp: 16  Temp: 97.7 F (36.5 C)  SpO2: 100%    General Appearance, alert, oriented and in no acute distress Neck: No cervical adenopathy Chest: Clear  to auscultation bilaterally Heart: Rate and rhythm regular No significant asymmetric swelling noted.  LABORATORY DATA: None for this visit   ASSESSMENT and THERAPY PLAN:   Malignant neoplasm of upper-outer quadrant of left breast in female, estrogen  receptor positive (HCC) Breanna Wilkins is a 63 year old woman with history of stage Ia ER/PR positive invasive ductal carcinoma of the left breast.  She is s/p lumpectomy, adjuvant radiation, and antiestrogen therapy with Anastrozole  which began in 02/2022.  Breast Cancer status postsurgery and radiation She has already tried tamoxifen , anastrozole  and exemestane .  She does not want to try any other antiestrogen therapy.  She had severe arthralgias and had to use a walker/cane while she was taking these medications.  She stopped them 2 weeks ago and she feels a whole lot better, sometimes forgets that she needs a cane.  She is willing to proceed with observation alone.  She has a mammogram coming up on January 25.  Self breast exam recommended monthly.  We will see her back in 6 months.  I will review the mammogram results and discussed with her if necessary.  She understands the importance of antiestrogen therapy but given the quality of life on his medications, she is willing to proceed with surveillance alone.  Encouraged activity, regular exercise and healthy diet.  All questions were answered. The patient knows to call the clinic with any problems, questions or concerns. We can certainly see the patient much sooner if necessary.  Total encounter time:30 minutes*in face-to-face visit time, chart review, lab review, care coordination, order entry, and documentation of the encounter time.  *Total Encounter Time as defined by the Centers for Medicare and Medicaid Services includes, in addition to the face-to-face time of a patient visit (documented in the note above) non-face-to-face time: obtaining and reviewing outside history, ordering and reviewing medications, tests or procedures, care coordination (communications with other health care professionals or caregivers) and documentation in the medical record.

## 2023-08-09 DIAGNOSIS — G4733 Obstructive sleep apnea (adult) (pediatric): Secondary | ICD-10-CM | POA: Diagnosis not present

## 2023-08-14 ENCOUNTER — Ambulatory Visit
Admission: RE | Admit: 2023-08-14 | Discharge: 2023-08-14 | Disposition: A | Discharge status: Home / Self Care | Payer: 59 | Source: Ambulatory Visit | Attending: Hematology and Oncology | Admitting: Hematology and Oncology

## 2023-08-14 DIAGNOSIS — Z853 Personal history of malignant neoplasm of breast: Secondary | ICD-10-CM

## 2023-08-17 ENCOUNTER — Other Ambulatory Visit: Payer: Self-pay | Admitting: Internal Medicine

## 2023-08-17 NOTE — Telephone Encounter (Signed)
[id number] rx was refilled 08/03/23 -  pt was called/informed. Oxycodone: Last rx written - 07/17/23. Last OV - 12/30 with Dr August Saucer. Next OV - pt will call back when schedule is available. TOX - 05/08/22.

## 2023-08-17 NOTE — Telephone Encounter (Signed)
oxyCODONE-acetaminophen (PERCOCET) 10-325 MG tablet   Semaglutide-Weight Management 2.4 MG/0.75ML SOAJ   [id number] - [place], [place] - [address]

## 2023-08-18 MED ORDER — OXYCODONE-ACETAMINOPHEN 10-325 MG PO TABS: 1.0000 | ORAL_TABLET | ORAL | 0 refills | Status: DC | PRN

## 2023-08-22 DIAGNOSIS — G4733 Obstructive sleep apnea (adult) (pediatric): Secondary | ICD-10-CM | POA: Diagnosis not present

## 2023-09-11 ENCOUNTER — Other Ambulatory Visit: Payer: Self-pay

## 2023-09-11 MED ORDER — OXYCODONE-ACETAMINOPHEN 10-325 MG PO TABS: 1.0000 | ORAL_TABLET | ORAL | 0 refills | Status: DC | PRN

## 2023-09-19 DIAGNOSIS — G4733 Obstructive sleep apnea (adult) (pediatric): Secondary | ICD-10-CM | POA: Diagnosis not present

## 2023-09-24 ENCOUNTER — Encounter: Payer: 59 | Admitting: Internal Medicine

## 2023-10-02 DIAGNOSIS — M170 Bilateral primary osteoarthritis of knee: Secondary | ICD-10-CM | POA: Diagnosis not present

## 2023-10-02 DIAGNOSIS — G8929 Other chronic pain: Secondary | ICD-10-CM | POA: Diagnosis not present

## 2023-10-07 DIAGNOSIS — G4733 Obstructive sleep apnea (adult) (pediatric): Secondary | ICD-10-CM | POA: Diagnosis not present

## 2023-10-08 ENCOUNTER — Ambulatory Visit (INDEPENDENT_AMBULATORY_CARE_PROVIDER_SITE_OTHER): Admitting: Internal Medicine

## 2023-10-08 ENCOUNTER — Encounter: Payer: Self-pay | Admitting: Internal Medicine

## 2023-10-08 VITALS — BP 115/51 | HR 72 | Temp 98.0°F | Ht 67.0 in | Wt 307.6 lb

## 2023-10-08 DIAGNOSIS — G8929 Other chronic pain: Secondary | ICD-10-CM

## 2023-10-08 DIAGNOSIS — R221 Localized swelling, mass and lump, neck: Secondary | ICD-10-CM | POA: Diagnosis not present

## 2023-10-08 DIAGNOSIS — G4733 Obstructive sleep apnea (adult) (pediatric): Secondary | ICD-10-CM | POA: Diagnosis not present

## 2023-10-08 DIAGNOSIS — Z6841 Body Mass Index (BMI) 40.0 and over, adult: Secondary | ICD-10-CM

## 2023-10-08 MED ORDER — OXYCODONE-ACETAMINOPHEN 10-325 MG PO TABS: 1.0000 | ORAL_TABLET | ORAL | 0 refills | Status: DC | PRN

## 2023-10-08 MED ORDER — TIRZEPATIDE-WEIGHT MANAGEMENT 2.5 MG/0.5ML ~~LOC~~ SOLN: 2.5000 mg | SUBCUTANEOUS | 0 refills | Status: DC

## 2023-10-08 MED ORDER — TIRZEPATIDE-WEIGHT MANAGEMENT 5 MG/0.5ML ~~LOC~~ SOLN: 5.0000 mg | SUBCUTANEOUS | 1 refills | Status: DC

## 2023-10-08 NOTE — Progress Notes (Signed)
 Established Patient Office Visit  Subjective   Patient ID: Breanna Wilkins, female    DOB: 1960-10-03  Age: 63 y.o. MRN: [id number]  Chief Complaint  Patient presents with   6 Month F/U Visit    "Knot" on right shoulder. Slight ankle swelling. Stopped [employer]. Feeling "down" sometimes.   Breanna Wilkins is here to follow-up chronic pain as well as obesity. For her obesity she was taking Wegovy she got all the way up to the 2.4 dose however started to feel like there is swelling in her neck like she had about 2 years ago.  She stopped the medication about 2 months ago and thinks the swelling in her neck subsided.  Additionally she has noticed an area of her right shoulder that seems more prominent she says she may have been doing some activity with her shoulder when this started but does not think she had any trauma to the area.  She notes that oxycodone and acetaminophen have continued to be effective for her chronic pain.  Has not had any concerns about the medication.     Objective:     BP (!) 115/51 (BP Location: Right Arm, Patient Position: Sitting, Cuff Size: Large)   Pulse 72   Temp 98 F (36.7 C) (Oral)   Ht 5\' 7"  (1.702 m)   Wt (!) 307 lb 9.6 oz (139.5 kg)   SpO2 99% Comment: RA  BMI 48.18 kg/m  BP Readings from Last 3 Encounters:  10/08/23 (!) 115/51  07/24/23 (!) 157/79  07/20/23 (!) 147/97   Wt Readings from Last 3 Encounters:  10/08/23 (!) 307 lb 9.6 oz (139.5 kg)  07/24/23 291 lb 14.4 oz (132.4 kg)  07/20/23 289 lb (131.1 kg)      Physical Exam Vitals and nursing note reviewed.  Constitutional:      Appearance: Normal appearance. She is obese.  HENT:     Head:     Comments: No palpable mass or swelling in the neck appreciated Pulmonary:     Effort: Pulmonary effort is normal.  Musculoskeletal:     Right lower leg: Edema (trace) present.     Left lower leg: Edema (trace) present.     Comments: Area of concern on right shoulder is the [facility] joint, which is  slightly more asymetric and prominent compared to the contralateral area.  Neurological:     Mental Status: She is alert.  Psychiatric:        Mood and Affect: Mood normal.        Behavior: Behavior normal.      No results found for any visits on 10/08/23.  Last metabolic panel Lab Results  Component Value Date   GLUCOSE 100 (H) 02/23/2023   NA 141 02/23/2023   K 4.2 02/23/2023   CL 102 02/23/2023   CO2 23 02/23/2023   BUN 23 02/23/2023   CREATININE 0.90 02/23/2023   EGFR 72 02/23/2023   CALCIUM 10.3 02/23/2023   PHOS 2.9 10/01/2012   PROT 7.4 02/23/2023   ALBUMIN 4.4 02/23/2023   LABGLOB 3.0 02/23/2023   AGRATIO 1.3 02/25/2018   BILITOT 0.3 02/23/2023   ALKPHOS 70 02/23/2023   AST 18 02/23/2023   ALT 16 02/23/2023   ANIONGAP 5 09/11/2021   Last hemoglobin A1c Lab Results  Component Value Date   HGBA1C 5.7 (A) 04/09/2023   Last thyroid functions Lab Results  Component Value Date   TSH 0.647 02/21/2021   T3TOTAL 95 02/21/2021      The  10-year ASCVD risk score (Arnett DK, et al., 2019) is: 7.2%    Assessment & Plan:   Problem List Items Addressed This Visit       Respiratory   Severe obstructive sleep apnea (Chronic)   Severe sleep apnea, on CPAP therapy.  Will see if we can add zepbound to her treatment regimen.        Other   Morbid obesity with BMI of 45.0-49.9, adult (HCC) (Chronic)   BMI is 48 today, weight is up some after stopping wegovy.  I think it would be fine to remain off of this however would like to see what else we can do about weight.  Given her concominant sleep apnea I think it would be great if we could get her onto treatmetn with zepbound.      Relevant Medications   tirzepatide (ZEPBOUND) 2.5 MG/0.5ML injection vial   tirzepatide 5 MG/0.5ML injection vial (Start on 11/05/2023)   Chronic pain (Chronic)   Remains on Oxycodone apap 10-325 #130/month.  No red flags, Refilled today      Relevant Medications    oxyCODONE-acetaminophen (PERCOCET) 10-325 MG tablet   oxyCODONE-acetaminophen (PERCOCET) 10-325 MG tablet (Start on 11/07/2023)   oxyCODONE-acetaminophen (PERCOCET) 10-325 MG tablet (Start on 12/07/2023)   Neck mass - Primary   I did not appreciate any neck mass today, she has had a CT of the area wihtout any concerns       Return if symptoms worsen or fail to improve.    Gust Rung, DO

## 2023-10-08 NOTE — Assessment & Plan Note (Signed)
 Severe sleep apnea, on CPAP therapy.  Will see if we can add zepbound to her treatment regimen.

## 2023-10-08 NOTE — Assessment & Plan Note (Signed)
 Remains on Oxycodone apap 10-325 #130/month.  No red flags, Refilled today

## 2023-10-08 NOTE — Assessment & Plan Note (Signed)
 I did not appreciate any neck mass today, she has had a CT of the area wihtout any concerns

## 2023-10-08 NOTE — Assessment & Plan Note (Signed)
 BMI is 48 today, weight is up some after stopping wegovy.  I think it would be fine to remain off of this however would like to see what else we can do about weight.  Given her concominant sleep apnea I think it would be great if we could get her onto treatmetn with zepbound.

## 2023-10-14 DIAGNOSIS — Z9229 Personal history of other drug therapy: Secondary | ICD-10-CM | POA: Diagnosis not present

## 2023-10-14 DIAGNOSIS — M25561 Pain in right knee: Secondary | ICD-10-CM | POA: Diagnosis not present

## 2023-10-14 DIAGNOSIS — M2391 Unspecified internal derangement of right knee: Secondary | ICD-10-CM | POA: Diagnosis not present

## 2023-10-14 DIAGNOSIS — G8929 Other chronic pain: Secondary | ICD-10-CM | POA: Diagnosis not present

## 2023-10-14 DIAGNOSIS — M1711 Unilateral primary osteoarthritis, right knee: Secondary | ICD-10-CM | POA: Diagnosis not present

## 2023-10-19 ENCOUNTER — Telehealth: Payer: Self-pay | Admitting: Internal Medicine

## 2023-10-19 NOTE — Telephone Encounter (Signed)
 Per request below can a referral be placed for this patient to see her former Cardiologist:  Dr. Verdis Prime or is an appointment required 1st?     Copied from CRM [id number]. Topic: Referral - Request for Referral >> Oct 19, 2023 10:51 AM Elmarie Shiley H wrote: Did the patient discuss referral with their provider in the last year? No.  (If No - schedule appointment) (If Yes - send message)  Appointment offered? No. Patient has previously established with this clinic.   Type of order/referral and detailed reason for visit: Cardiology. Patient advised that she's gained some weight, has had some swelling in hands and feet. She's also noticing a difference in sleep quality. No new shortness of breath or other symptoms.   Preference of office, provider, location: Dr. Verdis Prime.  [address] [place], [place], [place] [address]   If referral order, have you been seen by this specialty before? Yes (If Yes, this issue or another issue? When? Where? History of CHF.  Can we respond through MyChart? No

## 2023-10-20 NOTE — Telephone Encounter (Signed)
 I guess my question would be if she has any complaints currently, I think she last saw dr Katrinka Blazing in 2021 and I have been managing most of her cardiac issues- I think it may be best to get her in for a quicker visit with one of the residents to decide if a new referral to cardiology is needed.

## 2023-10-21 ENCOUNTER — Telehealth: Payer: Self-pay

## 2023-10-21 ENCOUNTER — Telehealth: Payer: Self-pay | Admitting: *Deleted

## 2023-10-21 DIAGNOSIS — G8929 Other chronic pain: Secondary | ICD-10-CM

## 2023-10-21 DIAGNOSIS — M1711 Unilateral primary osteoarthritis, right knee: Secondary | ICD-10-CM

## 2023-10-21 NOTE — Telephone Encounter (Signed)
 Copied from CRM [id number]. Topic: Clinical - Medication Question >> Oct 21, 2023  1:47 PM Maree Krabbe H wrote: Reason for CRM: Patient called and said Dr. Mikey Bussing was suppose to call her and tell her about a rx for weight lost, patients callback number is [phone].

## 2023-10-21 NOTE — Telephone Encounter (Signed)
 Prior Authorization has been submitted awaiting approval or denial. Please see note from today (10/21/23).

## 2023-10-21 NOTE — Telephone Encounter (Signed)
 Placed referral for DME rollator.

## 2023-10-21 NOTE — Telephone Encounter (Signed)
 Pt stated she never received the weight loss med prescribed by Dr Mikey Bussing from the pharmacy [facility]). I called [facility] - stated PA is needed; they will re-fax PA request. Pt informed also pt wants to know if she can get a Rolator?  I will ask  Cala Bradford to f/u on the PA.

## 2023-10-21 NOTE — Telephone Encounter (Signed)
 Prior Authorization for patient (Zepbound 2.5MG /0.5ML pen-injectors) came through on cover my meds was submitted with last office notes and sleep study awaiting approval or denial.  ZOX:[id number]

## 2023-10-22 NOTE — Telephone Encounter (Addendum)
 Breanna Wilkins (Key: [id number]) Zepbound 2.5MG /0.5ML pen-injectors Form [employer] Medicare Part D Electronic Prior Authorization Form (2017 NCPDP) Created Sent to Plan Plan Response Submit Clinical Questions Determination Favorable Message from Plan Request Reference Number: [id number]. ZEPBOUND INJ 2.5/0.5 is approved through 07/20/2024. Your patient may now fill this prescription and it will be covered.. Authorization Expiration Date: July 20, 2024.  Patient is aware.

## 2023-10-28 ENCOUNTER — Ambulatory Visit (INDEPENDENT_AMBULATORY_CARE_PROVIDER_SITE_OTHER): Admitting: Student

## 2023-10-28 ENCOUNTER — Encounter: Payer: Self-pay | Admitting: Student

## 2023-10-28 DIAGNOSIS — Z7409 Other reduced mobility: Secondary | ICD-10-CM

## 2023-10-28 DIAGNOSIS — M722 Plantar fascial fibromatosis: Secondary | ICD-10-CM

## 2023-10-28 DIAGNOSIS — M25562 Pain in left knee: Secondary | ICD-10-CM | POA: Diagnosis not present

## 2023-10-28 DIAGNOSIS — M25561 Pain in right knee: Secondary | ICD-10-CM | POA: Diagnosis not present

## 2023-10-28 DIAGNOSIS — M1711 Unilateral primary osteoarthritis, right knee: Secondary | ICD-10-CM

## 2023-10-28 NOTE — Progress Notes (Signed)
 Internal Medicine Clinic Attending  Case discussed with the resident at the time of the visit.  We reviewed the resident's history and exam and pertinent patient test results.  I agree with the assessment, diagnosis, and plan of care documented in the resident's note.

## 2023-10-28 NOTE — Progress Notes (Signed)
  [facility] Internal Medicine Residency Telephone Encounter Continuity Care Appointment  HPI:  This telephone encounter was created for [name] on 10/28/2023 for the following purpose/cc re.   Past Medical History:  Past Medical History:  Diagnosis Date   Abdominal hernia    Arthritis    Breast cancer (HCC)    CAD (coronary artery disease)    Perioperative non-STEMI March, 2014  //   cardiac catheterization at time of non-STEMI, March, 2014, minor nonobstructive coronary disease, vigorous LV function, possibly vasospastic event versus stress cardiomyopathy. No further workup of coronary disease   CHF (congestive heart failure) (HCC)    Diverticulosis of colon 10/17/2017   Noted on colonoscopy 10/14/17   Ejection fraction    65-70%, vigorous function, echo, March, 2011   Ejection fraction    Vigorous function at time of catheterization October 06, 2012,   GERD (gastroesophageal reflux disease)    History of colonic polyps 10/17/2017   Colonoscopy 10/14/17   Hyperlipidemia    Hypertension    Internal hemorrhoids without complication 10/17/2017   Noted on colonoscopy 10/14/17   Lung nodule 12/2009   Very small, left upper lobe by CT June, 2011, one-year followup was stable   Myocardial infarction (HCC) 09/2013   OSA (obstructive sleep apnea)    CPAP   Overweight(278.02)    Pulmonary embolus (HCC) 01/16/2010   on Coumadin indefinitely   Shortness of breath    Shoulder pain    Sleep apnea    Warfarin anticoagulation      ROS:     Assessment / Plan / Recommendations:  Impaired mobility Patient with significant impaired mobility for musculoskeletal, chronic knee and leg pain on chronic therapeutic opiate analgesic use complicated by morbid obesity requesting rollator walker today. She reports that her bilateral knee pain limits the length of her stride, distance she is able to walk. Her home has a long hallway that does not allow for significant pauses or ability to  sit down. Currently using a cane but offer enough support. Patient is adherent to her prescribed medications. Patient also received knee injections for which she has an appointment on 4/23.  It is my medical opinion this patient will benefit from a Rolator walker.  - Referral to PT for aquatherapy   As always, pt is advised that if symptoms worsen or new symptoms arise, they should go to an urgent care facility or to to ER for further evaluation.   Consent and Medical Decision Making:  Patient discussed with Dr.  Frances Furbish This is a telephone encounter between Breanna Wilkins and Breanna Wilkins ,MD on 10/28/2023 for knee pain and impaired mobility. The visit was conducted with the patient located at home and Breanna Wilkins ,MD at [facility]. The patient's identity was confirmed using their DOB and current address. The patient has consented to being evaluated through a telephone encounter and understands the associated risks (an examination cannot be done and the patient may need to come in for an appointment) / benefits (allows the patient to remain at home, decreasing exposure to coronavirus). I personally spent 15 minutes on medical discussion.

## 2023-10-28 NOTE — Assessment & Plan Note (Addendum)
 Patient with significant impaired mobility for musculoskeletal, chronic knee and leg pain on chronic therapeutic opiate analgesic use complicated by morbid obesity requesting rollator walker today. She reports that her bilateral knee pain limits the length of her stride, distance she is able to walk. Her home has a long hallway that does not allow for significant pauses or ability to sit down. Currently using a cane but offer enough support. Patient is adherent to her prescribed medications. Patient also received knee injections for which she has an appointment on 4/23.  It is my medical opinion this patient will benefit from a Rolator walker.  - Referral to PT for aquatherapy

## 2023-11-10 ENCOUNTER — Telehealth: Payer: Self-pay | Admitting: Internal Medicine

## 2023-11-10 NOTE — Telephone Encounter (Signed)
 Pt is aware of the following appt to f/u     MRN: [id number]  Date: 12/17/2023 Status: Sch  Time: 1:15 PM Length: 30  Visit Type: OPEN ESTABLISHED [726] Copay: $0.00  Provider: Jonelle Neri, DO        Copied from CRM [id number]. Topic: Referral - Question >> Nov 10, 2023  2:19 PM Adrianna P wrote: Reason for CRM: patient would like dr Adriane Albe to refer her to a  heart doctor

## 2023-11-11 DIAGNOSIS — M1711 Unilateral primary osteoarthritis, right knee: Secondary | ICD-10-CM | POA: Diagnosis not present

## 2023-11-11 DIAGNOSIS — S83241A Other tear of medial meniscus, current injury, right knee, initial encounter: Secondary | ICD-10-CM | POA: Diagnosis not present

## 2023-11-11 DIAGNOSIS — M67961 Unspecified disorder of synovium and tendon, right lower leg: Secondary | ICD-10-CM | POA: Diagnosis not present

## 2023-11-11 DIAGNOSIS — M25461 Effusion, right knee: Secondary | ICD-10-CM | POA: Diagnosis not present

## 2023-11-11 DIAGNOSIS — S83281A Other tear of lateral meniscus, current injury, right knee, initial encounter: Secondary | ICD-10-CM | POA: Diagnosis not present

## 2023-11-11 DIAGNOSIS — M5416 Radiculopathy, lumbar region: Secondary | ICD-10-CM | POA: Diagnosis not present

## 2023-11-11 DIAGNOSIS — M948X6 Other specified disorders of cartilage, lower leg: Secondary | ICD-10-CM | POA: Diagnosis not present

## 2023-11-13 DIAGNOSIS — M1711 Unilateral primary osteoarthritis, right knee: Secondary | ICD-10-CM | POA: Diagnosis not present

## 2023-11-13 DIAGNOSIS — Z7409 Other reduced mobility: Secondary | ICD-10-CM | POA: Diagnosis not present

## 2023-11-13 DIAGNOSIS — G8929 Other chronic pain: Secondary | ICD-10-CM | POA: Diagnosis not present

## 2023-11-17 ENCOUNTER — Ambulatory Visit: Payer: 59 | Admitting: Hematology and Oncology

## 2023-11-19 DIAGNOSIS — G4733 Obstructive sleep apnea (adult) (pediatric): Secondary | ICD-10-CM | POA: Diagnosis not present

## 2023-11-20 DIAGNOSIS — M170 Bilateral primary osteoarthritis of knee: Secondary | ICD-10-CM | POA: Diagnosis not present

## 2023-11-20 DIAGNOSIS — M25561 Pain in right knee: Secondary | ICD-10-CM | POA: Diagnosis not present

## 2023-11-20 DIAGNOSIS — M25562 Pain in left knee: Secondary | ICD-10-CM | POA: Diagnosis not present

## 2023-11-20 DIAGNOSIS — M222X2 Patellofemoral disorders, left knee: Secondary | ICD-10-CM | POA: Diagnosis not present

## 2023-11-20 DIAGNOSIS — G8929 Other chronic pain: Secondary | ICD-10-CM | POA: Diagnosis not present

## 2023-11-20 DIAGNOSIS — M222X1 Patellofemoral disorders, right knee: Secondary | ICD-10-CM | POA: Diagnosis not present

## 2023-11-20 DIAGNOSIS — M2391 Unspecified internal derangement of right knee: Secondary | ICD-10-CM | POA: Diagnosis not present

## 2023-11-23 ENCOUNTER — Other Ambulatory Visit: Payer: Self-pay | Admitting: Internal Medicine

## 2023-11-23 NOTE — Telephone Encounter (Signed)
 Copied from CRM [id number]. Topic: Clinical - Medication Refill >> Nov 23, 2023  2:05 PM Danelle Dunning F wrote: Most Recent Primary Care Visit:  Provider: Cathey Clunes  Department: [facility]  Visit Type: TELEPHONE OFFICE VISIT  Date: 10/28/2023  Medication: tirzepatide (ZEPBOUND) 2.5 MG/0.5ML injection vial  Has the patient contacted their pharmacy? Yes   Is this the correct pharmacy for this prescription? Yes   This is the patient's preferred pharmacy:  [id number] - [place], [place] - [address] [place] [address] Phone: [phone] Fax: [phone]    Has the prescription been filled recently? No  Is the patient out of the medication? Yes  Has the patient been seen for an appointment in the last year OR does the patient have an upcoming appointment? Yes  Can we respond through MyChart? Yes  Agent: Please be advised that Rx refills may take up to 3 business days. We ask that you follow-up with your pharmacy.

## 2023-11-26 MED ORDER — TIRZEPATIDE-WEIGHT MANAGEMENT 5 MG/0.5ML ~~LOC~~ SOLN: 5.0000 mg | SUBCUTANEOUS | 2 refills | Status: DC

## 2023-11-26 NOTE — Telephone Encounter (Signed)
 Looks like she has been on 2.5 for at least 4 weeks will increase to 5mg  dose.

## 2023-11-30 DIAGNOSIS — M170 Bilateral primary osteoarthritis of knee: Secondary | ICD-10-CM | POA: Diagnosis not present

## 2023-12-03 DIAGNOSIS — M1711 Unilateral primary osteoarthritis, right knee: Secondary | ICD-10-CM | POA: Diagnosis not present

## 2023-12-03 DIAGNOSIS — M1712 Unilateral primary osteoarthritis, left knee: Secondary | ICD-10-CM | POA: Diagnosis not present

## 2023-12-15 ENCOUNTER — Ambulatory Visit (HOSPITAL_BASED_OUTPATIENT_CLINIC_OR_DEPARTMENT_OTHER): Attending: Internal Medicine | Admitting: Physical Therapy

## 2023-12-15 NOTE — Therapy (Deleted)
 OUTPATIENT PHYSICAL THERAPY LOWER EXTREMITY EVALUATION   Patient Name: Breanna Wilkins MRN: [id number] DOB:Dec 10, 1960, 63 y.o., female Today's Date: 12/15/2023  END OF SESSION:   Past Medical History:  Diagnosis Date   Abdominal hernia    Arthritis    Breast cancer (HCC)    CAD (coronary artery disease)    Perioperative non-STEMI March, 2014  //   cardiac catheterization at time of non-STEMI, March, 2014, minor nonobstructive coronary disease, vigorous LV function, possibly vasospastic event versus stress cardiomyopathy. No further workup of coronary disease   CHF (congestive heart failure) (HCC)    Diverticulosis of colon 10/17/2017   Noted on colonoscopy 10/14/17   Ejection fraction    65-70%, vigorous function, echo, March, 2011   Ejection fraction    Vigorous function at time of catheterization October 06, 2012,   GERD (gastroesophageal reflux disease)    History of colonic polyps 10/17/2017   Colonoscopy 10/14/17   Hyperlipidemia    Hypertension    Internal hemorrhoids without complication 10/17/2017   Noted on colonoscopy 10/14/17   Lung nodule 12/2009   Very small, left upper lobe by CT June, 2011, one-year followup was stable   Myocardial infarction (HCC) 09/2013   OSA (obstructive sleep apnea)    CPAP   Overweight(278.02)    Pulmonary embolus (HCC) 01/16/2010   on Coumadin  indefinitely   Shortness of breath    Shoulder pain    Sleep apnea    Warfarin anticoagulation    Past Surgical History:  Procedure Laterality Date   ABDOMINAL HYSTERECTOMY  07/2002   laparoscopic assisted vaginal hysterectomy for menorrhagia, dysmenorrhea, anemia, fibroids   BREAST BIOPSY Left 09/03/2021   BREAST LUMPECTOMY     BREAST LUMPECTOMY WITH RADIOACTIVE SEED AND SENTINEL LYMPH NODE BIOPSY Left 10/03/2021   Procedure: LEFT BREAST LUMPECTOMY WITH RADIOACTIVE SEED AND SENTINEL LYMPH NODE BIOPSY;  Surgeon: Lockie Rima, MD;  Location: [facility] OR;  Service: General;  Laterality: Left;    COLOSTOMY     INCISIONAL HERNIA REPAIR N/A 09/27/2012   Procedure: LAPAROSCOPIC INCISIONAL HERNIA possible open;  Surgeon: Levert Ready, MD;  Location: [facility] OR;  Service: General;  Laterality: N/A;   INSERTION OF MESH N/A 09/27/2012   Procedure: INSERTION OF MESH;  Surgeon: Levert Ready, MD;  Location: [facility] OR;  Service: General;  Laterality: N/A;   KNEE ARTHROSCOPY Left    LEFT HEART CATHETERIZATION WITH CORONARY ANGIOGRAM N/A 10/06/2012   Procedure: LEFT HEART CATHETERIZATION WITH CORONARY ANGIOGRAM;  Surgeon: Arnoldo Lapping, MD;  Location: [facility] CATH LAB;  Service: Cardiovascular;  Laterality: N/A;   Patient Active Problem List   Diagnosis Date Noted   Impaired mobility 10/28/2023   Dysuria 07/20/2023   Statin intolerance 04/09/2023   Preop examination 10/02/2021   Genetic testing 09/27/2021   Family history of ovarian cancer 09/12/2021   Malignant neoplasm of upper-outer quadrant of left breast in female, estrogen receptor positive (HCC) 09/10/2021   Neck mass 02/22/2021   Musculoskeletal neck pain 05/22/2020   Leg pain, posterior, left 12/21/2019   Strain of rectus abdominis muscle 09/06/2019   Sciatica of left side 11/10/2018   Meralgia paraesthetica 10/20/2018   Chronic fatigue 10/01/2018   Diverticulosis of colon 10/17/2017   Internal hemorrhoids without complication 10/17/2017   History of colonic polyps 10/17/2017   BRBPR (bright red blood per rectum) 08/25/2017   Chronic pain 07/10/2017   Long term (current) use of opiate analgesic 09/17/2016   Vitamin D  deficiency 09/17/2016   Primary osteoarthritis of right  knee 03/04/2016   Nipple dermatitis 11/20/2015   Prediabetes 06/07/2015   Morbid obesity with BMI of 45.0-49.9, adult (HCC) 06/07/2015   GERD (gastroesophageal reflux disease) 05/29/2014   Atypical chest pain 02/23/2014   Plantar fasciitis of right foot 02/07/2014   Carpal tunnel syndrome of right wrist 12/05/2013   Pain of left calf 11/03/2013   CAD  (coronary artery disease), native coronary artery    CHF NYHA class II (symptoms with moderately strenuous activities) (HCC) 10/11/2012   Hyperlipidemia 08/31/2012   History of DVT (deep vein thrombosis) 03/20/2011   Long term (current) use of anticoagulants 08/09/2010   Supraspinatus tendon tear 02/11/2010   History of pulmonary embolism 01/16/2010   Severe obstructive sleep apnea 06/29/2009   Essential hypertension, benign 06/04/2009    PCP: eric hoffman DO  REFERRING PROVIDER: Cherylene Corrente, MD   REFERRING DIAG:  Z74.09 (ICD-10-CM) - Impaired mobility  M17.11 (ICD-10-CM) - Primary osteoarthritis of right knee  E66.01,Z68.42 (ICD-10-CM) - Morbid obesity with BMI of 45.0-49.9, adult (HCC)    THERAPY DIAG:  No diagnosis found.  Rationale for Evaluation and Treatment: Rehabilitation  ONSET DATE: ***  SUBJECTIVE:   SUBJECTIVE STATEMENT: ***  PERTINENT HISTORY: "Aquatherapy for osteoarthritis in person with obesity " PAIN:  Are you having pain? Yes: NPRS scale: *** Pain location: *** Pain description: *** Aggravating factors: *** Relieving factors: ***  PRECAUTIONS: Fall  RED FLAGS: {PT Red Flags:29287}   WEIGHT BEARING RESTRICTIONS: No  FALLS:  Has patient fallen in last 6 months? {fallsyesno:27318}  LIVING ENVIRONMENT: Lives with: {OPRC lives with:25569::"lives with their family"} Lives in: {Lives in:25570} Stairs: {opstairs:27293} Has following equipment at home: {Assistive devices:23999}  OCCUPATION: ***  PLOF: {PLOF:24004}  PATIENT GOALS: ***  NEXT MD VISIT: ***  OBJECTIVE:  Note: Objective measures were completed at Evaluation unless otherwise noted.  DIAGNOSTIC FINDINGS: ***  PATIENT SURVEYS:  LEFS  Extreme difficulty/unable (0), Quite a bit of difficulty (1), Moderate difficulty (2), Little difficulty (3), No difficulty (4) Survey date:    Any of your usual work, housework or school activities   2. Usual hobbies, recreational or  sporting activities   3. Getting into/out of the bath   4. Walking between rooms   5. Putting on socks/shoes   6. Squatting    7. Lifting an object, like a bag of groceries from the floor   8. Performing light activities around your home   9. Performing heavy activities around your home   10. Getting into/out of a car   11. Walking 2 blocks   12. Walking 1 mile   13. Going up/down 10 stairs (1 flight)   14. Standing for 1 hour   15.  sitting for 1 hour   16. Running on even ground   17. Running on uneven ground   18. Making sharp turns while running fast   19. Hopping    20. Rolling over in bed   Score total:  ***     COGNITION: Overall cognitive status: {cognition:24006}     SENSATION: {sensation:27233}  EDEMA:  {edema:24020}  MUSCLE LENGTH: Hamstrings: Right *** deg; Left *** deg Andy Bannister test: Right *** deg; Left *** deg  POSTURE: {posture:25561}  PALPATION: ***  LOWER EXTREMITY ROM:  {AROM/PROM:27142} ROM Right eval Left eval  Hip flexion    Hip extension    Hip abduction    Hip adduction    Hip internal rotation    Hip external rotation    Knee flexion  Knee extension    Ankle dorsiflexion    Ankle plantarflexion    Ankle inversion    Ankle eversion     (Blank rows = not tested)  LOWER EXTREMITY MMT:  MMT Right eval Left eval  Hip flexion    Hip extension    Hip abduction    Hip adduction    Hip internal rotation    Hip external rotation    Knee flexion    Knee extension    Ankle dorsiflexion    Ankle plantarflexion    Ankle inversion    Ankle eversion     (Blank rows = not tested)  LOWER EXTREMITY SPECIAL TESTS:  {LEspecialtests:26242}  FUNCTIONAL TESTS:  {Functional tests:24029}  GAIT: Distance walked: *** Assistive device utilized: {Assistive devices:23999} Level of assistance: {Levels of assistance:24026} Comments: ***                                                                                                                                 TREATMENT DATE: ***    PATIENT EDUCATION:  Education details: Discussed eval findings, rehab rationale, aquatic program progression/POC and pools in area. Patient is in agreement  Person educated: Patient Education method: Explanation Education comprehension: verbalized understanding  HOME EXERCISE PROGRAM: TBA  ASSESSMENT:  CLINICAL IMPRESSION: Patient is a 63 y.o. f who was seen today for physical therapy with "significant impaired mobility for musculoskeletal, chronic knee and leg pain on chronic therapeutic opiate analgesic use complicated by morbid obesity requesting rollator walker today."    OBJECTIVE IMPAIRMENTS: {opptimpairments:25111}.   ACTIVITY LIMITATIONS: {activitylimitations:27494}  PARTICIPATION LIMITATIONS: {participationrestrictions:25113}  PERSONAL FACTORS: {Personal factors:25162} are also affecting patient's functional outcome.   REHAB POTENTIAL: {rehabpotential:25112}  CLINICAL DECISION MAKING: {clinical decision making:25114}  EVALUATION COMPLEXITY: {Evaluation complexity:25115}   GOALS: Goals reviewed with patient? Yes  SHORT TERM GOALS: Target date: *** *** Baseline: Goal status: INITIAL  2.  *** Baseline:  Goal status: INITIAL  3.  *** Baseline:  Goal status: INITIAL  4.  *** Baseline:  Goal status: INITIAL  5.  *** Baseline:  Goal status: INITIAL  6.  *** Baseline:  Goal status: INITIAL  LONG TERM GOALS: Target date: ***  *** Baseline:  Goal status: INITIAL  2.  *** Baseline:  Goal status: INITIAL  3.  *** Baseline:  Goal status: INITIAL  4.  *** Baseline:  Goal status: INITIAL  5.  *** Baseline:  Goal status: INITIAL  6.  *** Baseline:  Goal status: INITIAL   PLAN:  PT FREQUENCY: {rehab frequency:25116}  PT DURATION: {rehab duration:25117}  PLANNED INTERVENTIONS: {rehab planned interventions:25118::"97110-Therapeutic exercises","97530- Therapeutic [phone]-  Neuromuscular re-education","97535- Self HYQM","57846- Manual therapy"}  PLAN FOR NEXT SESSION: Aretta Kudo, PT 12/15/2023, 11:04 AM

## 2023-12-17 ENCOUNTER — Ambulatory Visit: Admitting: Student

## 2023-12-17 ENCOUNTER — Encounter: Payer: Self-pay | Admitting: Student

## 2023-12-17 VITALS — BP 128/64 | HR 75 | Temp 98.4°F | Ht 67.0 in | Wt 295.6 lb

## 2023-12-17 DIAGNOSIS — Z6841 Body Mass Index (BMI) 40.0 and over, adult: Secondary | ICD-10-CM

## 2023-12-17 DIAGNOSIS — I5020 Unspecified systolic (congestive) heart failure: Secondary | ICD-10-CM

## 2023-12-17 DIAGNOSIS — E7849 Other hyperlipidemia: Secondary | ICD-10-CM

## 2023-12-17 DIAGNOSIS — E785 Hyperlipidemia, unspecified: Secondary | ICD-10-CM

## 2023-12-17 DIAGNOSIS — I2510 Atherosclerotic heart disease of native coronary artery without angina pectoris: Secondary | ICD-10-CM | POA: Diagnosis not present

## 2023-12-17 DIAGNOSIS — Z23 Encounter for immunization: Secondary | ICD-10-CM | POA: Diagnosis not present

## 2023-12-17 DIAGNOSIS — I110 Hypertensive heart disease with heart failure: Secondary | ICD-10-CM

## 2023-12-17 DIAGNOSIS — I10 Essential (primary) hypertension: Secondary | ICD-10-CM

## 2023-12-17 MED ORDER — TIRZEPATIDE-WEIGHT MANAGEMENT 5 MG/0.5ML ~~LOC~~ SOLN: 5.0000 mg | SUBCUTANEOUS | 5 refills | Status: DC

## 2023-12-17 MED ORDER — OXYCODONE-ACETAMINOPHEN 10-325 MG PO TABS: 1.0000 | ORAL_TABLET | ORAL | 0 refills | Status: DC | PRN

## 2023-12-17 NOTE — Progress Notes (Signed)
 Internal Medicine Clinic Attending  Case discussed with the resident at the time of the visit.  We reviewed the resident's history and exam and pertinent patient test results.  I agree with the assessment, diagnosis, and plan of care documented in the resident's note.

## 2023-12-17 NOTE — Patient Instructions (Addendum)
 Breanna Wilkins,Thank you for allowing me to take part in your care today.  Here are your instructions.  1.  I have referred you to a cardiologist.  2.  I am obtaining an ultrasound of your heart.  3.  I am going to check your cholesterol  4.  Come back in 2 months and at that time Dr. Adriane Albe can refill your chronic pain medicine  4.  You have received your vaccine today for pneumonia.  PLEASE BRING YOUR MEDICATIONS TO EVERY APPOINTMENT  Thank you, Dr. Lydia Sams  If you have any other questions please contact the internal medicine clinic at [phone] If it is after hours, please call the [place] [facility] at [phone] and then ask the person who picks up for the resident on call.

## 2023-12-17 NOTE — Assessment & Plan Note (Signed)
 Administered pneumonia vaccine today.

## 2023-12-17 NOTE — Assessment & Plan Note (Signed)
 Patient is doing well.  No further exacerbations since she has last been seen.  She looks euvolemic on my exam today.  She has no concerns.  Wants to be evaluated by cardiology and plug back in.  I agree with this.  Plan: - Continue Lasix  20 mg as needed - Continue carvedilol  6.25 mg twice daily - Continue olmesartan  40 mg daily - Obtain echocardiogram today - Refer back to cardiology

## 2023-12-17 NOTE — Progress Notes (Signed)
 CC: Cardiology referral request  HPI:  [name] is a 63 y.o. female with a past medical history of CAD, CHF, hypertension who presents for referral to cardiologist.  Please see assessment and plan for full HPI.  Medications: DVT: Xarelto  20 mg daily History of breast cancer: Tamoxifen  20 mg daily Weight loss: Tirzepatide 5 mg weekly Chronic pain: Percocet 10 mg every 4 hours as needed Hypertension: Olmesartan  40 mg daily, Lasix  20 mg daily as needed, carvedilol  6.25 mg twice daily HFrecEF: Coreg  6.25 mg twice daily, Lasix  20 mg daily as needed, olmesartan  40 mg daily Hyperlipidemia: Bempedoic acid  180 mg daily, Zetia  10 mg daily Vitamin D  deficiency: Vitamin D  supplementation CAD: Aspirin  81 mg daily  Past Medical History:  Diagnosis Date   Abdominal hernia    Arthritis    Breast cancer (HCC)    CAD (coronary artery disease)    Perioperative non-STEMI March, 2014  //   cardiac catheterization at time of non-STEMI, March, 2014, minor nonobstructive coronary disease, vigorous LV function, possibly vasospastic event versus stress cardiomyopathy. No further workup of coronary disease   CHF (congestive heart failure) (HCC)    Diverticulosis of colon 10/17/2017   Noted on colonoscopy 10/14/17   Ejection fraction    65-70%, vigorous function, echo, March, 2011   Ejection fraction    Vigorous function at time of catheterization October 06, 2012,   GERD (gastroesophageal reflux disease)    History of colonic polyps 10/17/2017   Colonoscopy 10/14/17   Hyperlipidemia    Hypertension    Internal hemorrhoids without complication 10/17/2017   Noted on colonoscopy 10/14/17   Lung nodule 12/2009   Very small, left upper lobe by CT June, 2011, one-year followup was stable   Myocardial infarction (HCC) 09/2013   OSA (obstructive sleep apnea)    CPAP   Overweight(278.02)    Pulmonary embolus (HCC) 01/16/2010   on Coumadin  indefinitely   Shortness of breath    Shoulder pain     Sleep apnea    Warfarin anticoagulation      Current Outpatient Medications:    aspirin  81 MG chewable tablet, Chew 1 tablet (81 mg total) by mouth daily., Disp: 30 tablet, Rfl: 1   Bempedoic Acid  180 MG TABS, Take 1 tablet (180 mg total) by mouth daily in the afternoon., Disp: 30 tablet, Rfl: 2   carvedilol  (COREG ) 6.25 MG tablet, Take 1 tablet (6.25 mg total) by mouth 2 (two) times daily with a meal., Disp: 200 tablet, Rfl: 3   Cholecalciferol (VITAMIN D ) 50 MCG (2000 UT) CAPS, Take 2,000 Units by mouth daily., Disp: , Rfl:    diclofenac  Sodium (VOLTAREN ) 1 % GEL, Apply 4g to knee four times a day as needed for pain, apply 2g to shoulder as needed for pain, Disp: 350 g, Rfl: 3   ezetimibe  (ZETIA ) 10 MG tablet, Take 1 tablet (10 mg total) by mouth daily., Disp: 100 tablet, Rfl: 3   furosemide  (LASIX ) 20 MG tablet, Take 1 tablet (20 mg total) by mouth daily as needed., Disp: 100 tablet, Rfl: 1   olmesartan  (BENICAR ) 40 MG tablet, Take 1 tablet (40 mg total) by mouth daily., Disp: 100 tablet, Rfl: 3   oxyCODONE -acetaminophen  (PERCOCET) 10-325 MG tablet, Take 1 tablet by mouth every 4 (four) hours as needed for pain., Disp: 130 tablet, Rfl: 0   oxyCODONE -acetaminophen  (PERCOCET) 10-325 MG tablet, Take 1 tablet by mouth every 4 (four) hours as needed for pain., Disp: 130 tablet, Rfl: 0   [  START ON 01/07/2024] oxyCODONE -acetaminophen  (PERCOCET) 10-325 MG tablet, Take 1 tablet by mouth every 4 (four) hours as needed for pain., Disp: 130 tablet, Rfl: 0   polyethylene glycol (MIRALAX  / GLYCOLAX ) packet, Take 17 g by mouth daily., Disp: , Rfl:    rivaroxaban  (XARELTO ) 20 MG TABS tablet, Take 1 tablet (20 mg total) by mouth daily with supper., Disp: 100 tablet, Rfl: 3   tamoxifen  (NOLVADEX ) 20 MG tablet, Take 1 tablet (20 mg total) by mouth daily., Disp: 90 tablet, Rfl: 3   tirzepatide  5 MG/0.5ML injection vial, Inject 5 mg into the skin once a week., Disp: 2 mL, Rfl: 5  Review of Systems:    Negative  except for what is stated in HPI  Physical Exam:  Vitals:   12/17/23 1310  BP: 128/64  Pulse: 75  Temp: 98.4 F (36.9 C)  TempSrc: Oral  SpO2: 99%  Weight: 295 lb 9.6 oz (134.1 kg)  Height: 5\' 7"  (1.702 m)   General: Patient is sitting comfortably in the room  Head: Normocephalic, atraumatic  Cardio: Regular rate and rhythm, no murmurs, rubs or gallops Pulmonary: Clear to ausculation bilaterally with no rales, rhonchi, and crackles   Assessment & Plan:   CAD (coronary artery disease), native coronary artery Patient has a past medical history of CAD status post NSTEMI in 2014.  Patient had cardiac catheterization which showed minor nonobstructive CAD.  Patient did have possible vasospastic event.  Patient did have decreased ejection fraction at that time.  Patient has since been lost to follow-up with cardiology.  Requested to be plugged back in.  I agree.  Plan: - Fortunately patient has been chest pain-free -Continue aspirin  81 mg daily - Patient is intolerant of statins therefore is on bempedoic acid  as well as Zetia  10 mg daily -Obtain lipid panel today  CHF NYHA class II (symptoms with moderately strenuous activities) [facility]) Patient is doing well.  No further exacerbations since she has last been seen.  She looks euvolemic on my exam today.  She has no concerns.  Wants to be evaluated by cardiology and plug back in.  I agree with this.  Plan: - Continue Lasix  20 mg as needed - Continue carvedilol  6.25 mg twice daily - Continue olmesartan  40 mg daily - Obtain echocardiogram today - Refer back to cardiology  Essential hypertension, benign Blood pressure today is 128/64.  This is well-controlled.  Continue with current medications.  Plan: - Continue Coreg  6.25 mg twice daily - Continue olmesartan  40 mg daily  Hyperlipidemia Patient has a past medical history of hyperlipidemia.  Outpatient medication bempedoic acid  and Zetia .  Patient has been intolerant of many  statins in the past.  Most recent lipid panel showing elevated LDL at 135.  This is not at goal.  Will obtain lipid panel today.  If patient not at goal, will likely need to refer to lipid clinic.  Plan: - Continue bempedoic acid  180 mg daily next -  Continue Zetia  10 mg daily - Obtain lipid panel - If LDL not at goal (less than 70) refer to lipid clinic  Need for Streptococcus pneumoniae vaccination Administered pneumonia vaccine today.  Morbid obesity with BMI of 45.0-49.9, adult (HCC) Patient BMI down to 46.3.  Weight today is 295.  She is down 12 pounds from previous visit.  She is doing well with her tirzepatide .  Will refill today.  She denies any side effects.  Plan: - Continue tirzepatide  5 mg weekly  Patient discussed with Dr. Lelia Putnam  Jonelle Neri, DO PGY-2 Internal Medicine Resident

## 2023-12-17 NOTE — Assessment & Plan Note (Signed)
 Patient BMI down to 46.3.  Weight today is 295.  She is down 12 pounds from previous visit.  She is doing well with her tirzepatide.  Will refill today.  She denies any side effects.  Plan: - Continue tirzepatide 5 mg weekly

## 2023-12-17 NOTE — Assessment & Plan Note (Signed)
 Blood pressure today is 128/64.  This is well-controlled.  Continue with current medications.  Plan: - Continue Coreg  6.25 mg twice daily - Continue olmesartan  40 mg daily

## 2023-12-17 NOTE — Assessment & Plan Note (Signed)
 Patient has a past medical history of hyperlipidemia.  Outpatient medication bempedoic acid  and Zetia .  Patient has been intolerant of many statins in the past.  Most recent lipid panel showing elevated LDL at 135.  This is not at goal.  Will obtain lipid panel today.  If patient not at goal, will likely need to refer to lipid clinic.  Plan: - Continue bempedoic acid  180 mg daily next -  Continue Zetia  10 mg daily - Obtain lipid panel - If LDL not at goal (less than 70) refer to lipid clinic

## 2023-12-17 NOTE — Assessment & Plan Note (Signed)
 Patient has a past medical history of CAD status post NSTEMI in 2014.  Patient had cardiac catheterization which showed minor nonobstructive CAD.  Patient did have possible vasospastic event.  Patient did have decreased ejection fraction at that time.  Patient has since been lost to follow-up with cardiology.  Requested to be plugged back in.  I agree.  Plan: - Fortunately patient has been chest pain-free -Continue aspirin  81 mg daily - Patient is intolerant of statins therefore is on bempedoic acid  as well as Zetia  10 mg daily -Obtain lipid panel today

## 2023-12-17 NOTE — Telephone Encounter (Signed)
   Medicare State [place]  Diagnosis code and description I42.9 Cardiomyopathy, unspecified This is paired with selected procedure codes Inquiry response Use the "Proceed with submission" button to proceed with a prior authorization submission for the services below. Please note, specific procedures may not require additional authorization, however all inpatient admissions require notification. Procedure code 16109 Description Echocardiography, transthoracic, real-time with image documentation (2D), includes M-mode recording, when performed, complete, with spectral Doppler echocardiography, and with color flow Doppler echocardiography Inquiry summary Notification/Prior Authorization not required for this service.   Copied from [id number]. Topic: Referral - Prior Authorization Question >> Dec 17, 2023  1:53 PM Carrielelia G wrote: Reason for CRM: Perla Bradford with Echocardiogram (call [phone])  would like to know if this patient is authorized because there some opening tomorrow 12/18/23

## 2023-12-18 ENCOUNTER — Telehealth: Payer: Self-pay

## 2023-12-18 ENCOUNTER — Ambulatory Visit: Payer: Self-pay | Admitting: Student

## 2023-12-18 DIAGNOSIS — E7849 Other hyperlipidemia: Secondary | ICD-10-CM

## 2023-12-18 LAB — LIPID PANEL
Chol/HDL Ratio: 4.3 ratio (ref 0.0–4.4)
Cholesterol, Total: 199 mg/dL (ref 100–199)
HDL: 46 mg/dL (ref >39)
LDL Chol Calc (NIH): 133 mg/dL — ABNORMAL HIGH (ref 0–99)
Triglycerides: 108 mg/dL (ref 0–149)
VLDL Cholesterol Cal: 20 mg/dL (ref 5–40)

## 2023-12-18 NOTE — Telephone Encounter (Signed)
 To: Jonelle Neri From: [facility] Phone: [phone] Phone: [phone] Fax: [phone] Reference #: [id number]  RE: Prior Authorization Request  Patient Name: Breanna Wilkins Patient DOB: 11-02-1960 Patient ID: [id number] Status of Request: Deny Medication Name: Mounjaro Inj 5mg /0.5 GPI/NDC: [id number] Decision Notes: Drugs when used for anorexia, weight loss, or weight gain are excluded from coverage under Medicare rules. Please refer to your Evidence of Coverage (EOC) section that references Part D drug coverage in your pharmacy plan documents for more information. Reviewed by: Marlene Simas

## 2023-12-18 NOTE — Telephone Encounter (Signed)
 Prior Authorization for patient (Mounjaro 5MG /0.5ML auto-injectors) came through on cover my meds was submitted with last office notes awaiting approval or denial.  MVH:[id number]

## 2024-01-01 ENCOUNTER — Ambulatory Visit (HOSPITAL_COMMUNITY)
Admission: RE | Admit: 2024-01-01 | Discharge: 2024-01-01 | Disposition: A | Discharge status: Home / Self Care | Source: Ambulatory Visit | Attending: Internal Medicine

## 2024-01-01 DIAGNOSIS — I110 Hypertensive heart disease with heart failure: Secondary | ICD-10-CM | POA: Diagnosis not present

## 2024-01-01 DIAGNOSIS — E785 Hyperlipidemia, unspecified: Secondary | ICD-10-CM | POA: Insufficient documentation

## 2024-01-01 DIAGNOSIS — I5020 Unspecified systolic (congestive) heart failure: Secondary | ICD-10-CM | POA: Insufficient documentation

## 2024-01-01 DIAGNOSIS — I2510 Atherosclerotic heart disease of native coronary artery without angina pectoris: Secondary | ICD-10-CM | POA: Diagnosis not present

## 2024-01-01 DIAGNOSIS — I3139 Other pericardial effusion (noninflammatory): Secondary | ICD-10-CM | POA: Insufficient documentation

## 2024-01-01 DIAGNOSIS — I429 Cardiomyopathy, unspecified: Secondary | ICD-10-CM | POA: Insufficient documentation

## 2024-01-01 DIAGNOSIS — R079 Chest pain, unspecified: Secondary | ICD-10-CM | POA: Insufficient documentation

## 2024-01-01 DIAGNOSIS — Z853 Personal history of malignant neoplasm of breast: Secondary | ICD-10-CM | POA: Insufficient documentation

## 2024-01-01 DIAGNOSIS — M170 Bilateral primary osteoarthritis of knee: Secondary | ICD-10-CM | POA: Diagnosis not present

## 2024-01-01 DIAGNOSIS — G4730 Sleep apnea, unspecified: Secondary | ICD-10-CM | POA: Insufficient documentation

## 2024-01-01 DIAGNOSIS — I428 Other cardiomyopathies: Secondary | ICD-10-CM

## 2024-01-01 LAB — ECHOCARDIOGRAM COMPLETE
AR max vel: 2.8 cm2
AV Peak grad: 7.8 mmHg
Ao pk vel: 1.4 m/s
Area-P 1/2: 2.54 cm2
S' Lateral: 2.9 cm

## 2024-01-01 NOTE — Progress Notes (Signed)
 Echocardiogram 2D Echocardiogram has been performed.  Breanna Wilkins 01/01/2024, 4:00 PM

## 2024-01-06 DIAGNOSIS — G4733 Obstructive sleep apnea (adult) (pediatric): Secondary | ICD-10-CM | POA: Diagnosis not present

## 2024-01-07 DIAGNOSIS — G4733 Obstructive sleep apnea (adult) (pediatric): Secondary | ICD-10-CM | POA: Diagnosis not present

## 2024-01-12 DIAGNOSIS — H04123 Dry eye syndrome of bilateral lacrimal glands: Secondary | ICD-10-CM | POA: Diagnosis not present

## 2024-01-12 DIAGNOSIS — H43813 Vitreous degeneration, bilateral: Secondary | ICD-10-CM | POA: Diagnosis not present

## 2024-01-20 ENCOUNTER — Telehealth: Payer: Self-pay

## 2024-01-20 NOTE — Telephone Encounter (Signed)
 Pt verbally confirmed appt for 7/3

## 2024-01-21 ENCOUNTER — Inpatient Hospital Stay: Payer: 59 | Attending: Hematology and Oncology | Admitting: Hematology and Oncology

## 2024-01-21 DIAGNOSIS — Z87891 Personal history of nicotine dependence: Secondary | ICD-10-CM | POA: Diagnosis not present

## 2024-01-21 DIAGNOSIS — Z170 Estrogen receptor positive status [ER+]: Secondary | ICD-10-CM | POA: Insufficient documentation

## 2024-01-21 DIAGNOSIS — Z79811 Long term (current) use of aromatase inhibitors: Secondary | ICD-10-CM | POA: Insufficient documentation

## 2024-01-21 DIAGNOSIS — Z1721 Progesterone receptor positive status: Secondary | ICD-10-CM | POA: Insufficient documentation

## 2024-01-21 DIAGNOSIS — Z7901 Long term (current) use of anticoagulants: Secondary | ICD-10-CM | POA: Diagnosis not present

## 2024-01-21 DIAGNOSIS — Z86718 Personal history of other venous thrombosis and embolism: Secondary | ICD-10-CM | POA: Insufficient documentation

## 2024-01-21 DIAGNOSIS — C50412 Malignant neoplasm of upper-outer quadrant of left female breast: Secondary | ICD-10-CM | POA: Insufficient documentation

## 2024-01-21 DIAGNOSIS — Z1732 Human epidermal growth factor receptor 2 negative status: Secondary | ICD-10-CM | POA: Diagnosis not present

## 2024-01-21 NOTE — Assessment & Plan Note (Addendum)
 Breanna Wilkins is a 63 year old woman with history of stage Ia ER/PR positive invasive ductal carcinoma of the left breast.  She is s/p lumpectomy, adjuvant radiation, and antiestrogen therapy with Anastrozole  which began in 02/2022.  Breast Cancer status postsurgery and radiation She has already tried tamoxifen , anastrozole  and exemestane .  She does not want to try any other antiestrogen therapy.   Assessment and Plan Assessment & Plan Breast tenderness Likely secondary to post op changes. No palpable mass noted on exam today No regional adenopathy Continue annual mammogram and FU.  Elbow pain Elbow pain due to irritation from resting on a driver with four wheels. - Advise elbow padding or physical barrier to alleviate pressure.  Knee pain due to arthritis Knee pain likely due to arthritis, persisting despite cessation of previous medications.  Left ventricular hypertrophy due to hypertension Left ventricular hypertrophy on echocardiogram attributed to hypertension. Heart function normal. - Advise maintaining blood pressure control.  Pericardial effusion Small pericardial effusion noted on echocardiogram, not concerning.  Weight loss Weight loss of eleven pounds attributed to medication use.

## 2024-01-21 NOTE — Progress Notes (Signed)
 [facility] Cancer Follow up:    Breanna Dayton BROCKS, DO [address] [place] [address]   DIAGNOSIS:  Cancer Staging  Malignant neoplasm of upper-outer quadrant of left breast in female, estrogen receptor positive (HCC) Staging form: Breast, AJCC 8th Edition - Pathologic stage from 10/03/2021: Stage IA (pT1c, pN0(i+), cM0, G2, ER+, PR+, HER2-) - Signed by Crawford Morna Pickle, NP on 05/11/2022 Histologic grading system: 3 grade system   SUMMARY OF ONCOLOGIC HISTORY: Oncology History  Malignant neoplasm of upper-outer quadrant of left breast in female, estrogen receptor positive (HCC)  09/03/2021 Initial Biopsy   Left breast biopsy: IDC, grade 1-2 of 3 ,0.9 cm in greatest linear extent.  ER 100% positive, moderate staining intensity, PR 100% positive, strong staining intensity, KI of less than 5%.  HER-2 IHC equivocal (2+), FISH negative.     Genetic Testing   [facility] CancerNext-Expanded panel was Negative. Of note, a variant of uncertain significance was detected in the BRCA1 gene (p.I429T). Report date is 09/30/2021.   The CancerNext-Expanded gene panel offered by [facility] and includes sequencing, rearrangement, and RNA analysis for the following 77 genes: AIP, ALK, APC, ATM, AXIN2, BAP1, BARD1, BLM, BMPR1A, BRCA1, BRCA2, BRIP1, CDC73, CDH1, CDK4, CDKN1B, CDKN2A, CHEK2, CTNNA1, DICER1, FANCC, FH, FLCN, GALNT12, KIF1B, LZTR1, MAX, MEN1, MET, MLH1, MSH2, MSH3, MSH6, MUTYH, NBN, NF1, NF2, NTHL1, PALB2, PHOX2B, PMS2, POT1, PRKAR1A, PTCH1, PTEN, RAD51C, RAD51D, RB1, RECQL, RET, SDHA, SDHAF2, SDHB, SDHC, SDHD, SMAD4, SMARCA4, SMARCB1, SMARCE1, STK11, SUFU, TMEM127, TP53, TSC1, TSC2, VHL and XRCC2 (sequencing and deletion/duplication); EGFR, EGLN1, HOXB13, KIT, MITF, PDGFRA, POLD1, and POLE (sequencing only); EPCAM and GREM1 (deletion/duplication only).    10/03/2021 Surgery   A.   BREAST, LEFT, LUMPECTOMY:  -    Invasive carcinoma of no special type (ductal),  grade 2, 18 mm in  greatest dimension, all margins negative for tumor.  -    Distance of invasive carcinoma from closest margin:         4 mm  (posterior margin).   B.   LYMPH NODE, LEFT AXILLA #1, SENTINEL, EXCISION:  -    Lymph node, negative for malignancy (0/1).   C.   LYMPH NODE, LEFT AXILLA #2, SENTINEL, EXCISION:  -    Lymph node with isolated tumor cells, metastatic deposit 0.2 mm in  greatest dimension and less than 200 cells.    10/03/2021 Cancer Staging   Staging form: Breast, AJCC 8th Edition - Pathologic stage from 10/03/2021: Stage IA (pT1c, pN0(i+), cM0, G2, ER+, PR+, HER2-) - Signed by Crawford Morna Pickle, NP on 05/11/2022 Histologic grading system: 3 grade system   10/03/2021 Oncotype testing   22, distant recurrence risk at 9 years at 18%, chemotherapy benefit cannot be excluded. Given small benefit of chemotherapy, she was agreeable to move forward with radiation alone.   11/26/2021 - 12/25/2021 Radiation Therapy   Adjuvant radiation in [place].05 Gy in 25 treatments followed by boost of 10 Gy in 5 treatments    02/2022 -  Anti-estrogen oral therapy   Anastrozole  daily     CURRENT THERAPY:Anastrozole   INTERVAL HISTORY:  Breanna Wilkins 63 y.o. female returns for f/u of her estrogen positive breast cancer on treatment with Anastrozole .   Discussed the use of AI scribe software for clinical note transcription with the patient, who gave verbal consent to proceed.  History of Present Illness Breanna Wilkins is a 63 year old female with history of breast cancer who presents for  follow-up regarding her elbow pain and for follow up.  She experiences irritation and soreness in her elbow, which she attributes to resting on it while using a four-wheeled walker.  She has ongoing knee pain, which she associates with arthritis, and it persists despite stopping certain medications.  She reports tenderness in her breast, which has been present since her last visit and  during her mammogram in January. There have been no new changes in her breast since then.  She has experienced weight loss, having lost eleven pounds recently, which she attributes to taking a medication that starts with a 'Z'.  She underwent an echocardiogram, which showed normal heart pumping function but some thickness in the left ventricle due to high blood pressure and a small amount of fluid around the heart.  She has joined the [employer] and plans to engage in water exercises.   Patient Active Problem List   Diagnosis Date Noted   Need for Streptococcus pneumoniae vaccination 12/17/2023   Impaired mobility 10/28/2023   Dysuria 07/20/2023   Statin intolerance 04/09/2023   Preop examination 10/02/2021   Genetic testing 09/27/2021   Family history of ovarian cancer 09/12/2021   Malignant neoplasm of upper-outer quadrant of left breast in female, estrogen receptor positive (HCC) 09/10/2021   Neck mass 02/22/2021   Musculoskeletal neck pain 05/22/2020   Leg pain, posterior, left 12/21/2019   Strain of rectus abdominis muscle 09/06/2019   Sciatica of left side 11/10/2018   Meralgia paraesthetica 10/20/2018   Chronic fatigue 10/01/2018   Diverticulosis of colon 10/17/2017   Internal hemorrhoids without complication 10/17/2017   History of colonic polyps 10/17/2017   BRBPR (bright red blood per rectum) 08/25/2017   Chronic pain 07/10/2017   Long term (current) use of opiate analgesic 09/17/2016   Vitamin D  deficiency 09/17/2016   Primary osteoarthritis of right knee 03/04/2016   Nipple dermatitis 11/20/2015   Prediabetes 06/07/2015   Morbid obesity with BMI of 45.0-49.9, adult (HCC) 06/07/2015   GERD (gastroesophageal reflux disease) 05/29/2014   Atypical chest pain 02/23/2014   Plantar fasciitis of right foot 02/07/2014   Carpal tunnel syndrome of right wrist 12/05/2013   Pain of left calf 11/03/2013   CAD (coronary artery disease), native coronary artery    CHF NYHA class II  (symptoms with moderately strenuous activities) (HCC) 10/11/2012   Hyperlipidemia 08/31/2012   History of DVT (deep vein thrombosis) 03/20/2011   Long term (current) use of anticoagulants 08/09/2010   Supraspinatus tendon tear 02/11/2010   History of pulmonary embolism 01/16/2010   Severe obstructive sleep apnea 06/29/2009   Essential hypertension, benign 06/04/2009    has no known allergies.  MEDICAL HISTORY: Past Medical History:  Diagnosis Date   Abdominal hernia    Arthritis    Breast cancer (HCC)    CAD (coronary artery disease)    Perioperative non-STEMI March, 2014  //   cardiac catheterization at time of non-STEMI, March, 2014, minor nonobstructive coronary disease, vigorous LV function, possibly vasospastic event versus stress cardiomyopathy. No further workup of coronary disease   CHF (congestive heart failure) (HCC)    Diverticulosis of colon 10/17/2017   Noted on colonoscopy 10/14/17   Ejection fraction    65-70%, vigorous function, echo, March, 2011   Ejection fraction    Vigorous function at time of catheterization October 06, 2012,   GERD (gastroesophageal reflux disease)    History of colonic polyps 10/17/2017   Colonoscopy 10/14/17   Hyperlipidemia    Hypertension    Internal  hemorrhoids without complication 10/17/2017   Noted on colonoscopy 10/14/17   Lung nodule 12/2009   Very small, left upper lobe by CT June, 2011, one-year followup was stable   Myocardial infarction (HCC) 09/2013   OSA (obstructive sleep apnea)    CPAP   Overweight(278.02)    Pulmonary embolus (HCC) 01/16/2010   on Coumadin  indefinitely   Shortness of breath    Shoulder pain    Sleep apnea    Warfarin anticoagulation     SURGICAL HISTORY: Past Surgical History:  Procedure Laterality Date   ABDOMINAL HYSTERECTOMY  07/2002   laparoscopic assisted vaginal hysterectomy for menorrhagia, dysmenorrhea, anemia, fibroids   BREAST BIOPSY Left 09/03/2021   BREAST LUMPECTOMY     BREAST  LUMPECTOMY WITH RADIOACTIVE SEED AND SENTINEL LYMPH NODE BIOPSY Left 10/03/2021   Procedure: LEFT BREAST LUMPECTOMY WITH RADIOACTIVE SEED AND SENTINEL LYMPH NODE BIOPSY;  Surgeon: Aron Shoulders, MD;  Location: [facility] OR;  Service: General;  Laterality: Left;   COLOSTOMY     INCISIONAL HERNIA REPAIR N/A 09/27/2012   Procedure: LAPAROSCOPIC INCISIONAL HERNIA possible open;  Surgeon: Elon CHRISTELLA Pacini, MD;  Location: [facility] OR;  Service: General;  Laterality: N/A;   INSERTION OF MESH N/A 09/27/2012   Procedure: INSERTION OF MESH;  Surgeon: Elon CHRISTELLA Pacini, MD;  Location: [facility] OR;  Service: General;  Laterality: N/A;   KNEE ARTHROSCOPY Left    LEFT HEART CATHETERIZATION WITH CORONARY ANGIOGRAM N/A 10/06/2012   Procedure: LEFT HEART CATHETERIZATION WITH CORONARY ANGIOGRAM;  Surgeon: Ozell Fell, MD;  Location[facility] CATH LAB;  Service: Cardiovascular;  Laterality: N/A;    SOCIAL HISTORY: Social History   Socioeconomic History   Marital status: Single    Spouse name: Not on file   Number of children: 0   Years of education: 12   Highest education level: 10th grade  Occupational History   Occupation: Disabled  Tobacco Use   Smoking status: Former    Current packs/day: 0.00    Types: Cigarettes    Quit date: 05/03/1988    Years since quitting: 35.7    Passive exposure: Never   Smokeless tobacco: Never  Vaping Use   Vaping status: Never Used  Substance and Sexual Activity   Alcohol use: Yes    Alcohol/week: 1.0 standard drink of alcohol    Types: 1 Cans of beer per week   Drug use: Never   Sexual activity: Not Currently  Other Topics Concern   Not on file  Social History Narrative   Financial assistance approved for 100% discount at [facility] and has [facility] card per Barnie Hill6/01/2010      Current Social History 02/11/2021        Patient lives with sister in a home  that is 1 story. There are 4 steps up to the entrance the patient uses. There is a railing.       Patient's method of  transportation is personal car.      The highest level of education was some high school.      The patient currently is employed as aid on school bus.      Identified important Relationships are my sister       Pets : 0       Interests / Fun: I like to fish and I love my job       Current Stressors: none       Religious / Personal Beliefs: I don't know        Social Drivers  of Health   Financial Resource Strain: Low Risk  (04/08/2023)   Overall Financial Resource Strain (CARDIA)    Difficulty of Paying Living Expenses: Not hard at all  Food Insecurity: No Food Insecurity (04/08/2023)   Hunger Vital Sign    Worried About Running Out of Food in the Last Year: Never true    Ran Out of Food in the Last Year: Never true  Transportation Needs: No Transportation Needs (04/08/2023)   PRAPARE - Administrator, Civil Service (Medical): No    Lack of Transportation (Non-Medical): No  Physical Activity: Insufficiently Active (04/08/2023)   Exercise Vital Sign    Days of Exercise per Week: 3 days    Minutes of Exercise per Session: 20 min  Stress: No Stress Concern Present (04/08/2023)   [employer] of Occupational Health - Occupational Stress Questionnaire    Feeling of Stress : Not at all  Social Connections: Socially Isolated (04/08/2023)   Social Connection and Isolation Panel    Frequency of Communication with Friends and Family: More than three times a week    Frequency of Social Gatherings with Friends and Family: Once a week    Attends Religious Services: Never    Database administrator or Organizations: No    Attends Banker Meetings: Never    Marital Status: Never married  Intimate Partner Violence: Not At Risk (07/03/2021)   Humiliation, Afraid, Rape, and Kick questionnaire    Fear of Current or Ex-Partner: No    Emotionally Abused: No    Physically Abused: No    Sexually Abused: No    FAMILY HISTORY: Family History  Problem  Relation Age of Onset   Diabetes Mother    Ovarian cancer Mother 31   Diabetes Father    Diabetes Sister    Colon polyps Sister    Brain cancer Maternal Aunt        unsure if it was a brain primary or cancer that metastasized to the brain   Cancer Paternal Uncle        unknown type   Bone cancer Maternal Grandmother        unsure if it was a bone primary or cancer that metastasized to the bones   Breast cancer Neg Hx    Colon cancer Neg Hx    Esophageal cancer Neg Hx    Rectal cancer Neg Hx    Stomach cancer Neg Hx     Review of Systems  Constitutional:  Negative for appetite change, chills, fatigue, fever and unexpected weight change.  HENT:   Negative for hearing loss, lump/mass and trouble swallowing.   Eyes:  Negative for eye problems and icterus.  Respiratory:  Negative for chest tightness, cough and shortness of breath.   Cardiovascular:  Negative for chest pain, leg swelling and palpitations.  Gastrointestinal:  Negative for abdominal distention, abdominal pain, constipation, diarrhea, nausea and vomiting.  Endocrine: Negative for hot flashes.  Genitourinary:  Negative for difficulty urinating.   Musculoskeletal:  Negative for arthralgias.  Skin:  Negative for itching and rash.  Neurological:  Negative for dizziness, extremity weakness, headaches and numbness.  Hematological:  Negative for adenopathy. Does not bruise/bleed easily.  Psychiatric/Behavioral:  Negative for depression. The patient is not nervous/anxious.       PHYSICAL EXAMINATION    Vitals:   01/21/24 1119  BP: (!) 116/49  Pulse: 78  Resp: 17  SpO2: 100%     General Appearance, alert, oriented and in no  acute distress Neck: No cervical adenopathy Bilateral breasts examined. No palpable masses or regional adenopathy  LABORATORY DATA: None for this visit   ASSESSMENT and THERAPY PLAN:   Malignant neoplasm of upper-outer quadrant of left breast in female, estrogen receptor positive  (HCC) Breanna Wilkins is a 63 year old woman with history of stage Ia ER/PR positive invasive ductal carcinoma of the left breast.  She is s/p lumpectomy, adjuvant radiation, and antiestrogen therapy with Anastrozole  which began in 02/2022.  Breast Cancer status postsurgery and radiation She has already tried tamoxifen , anastrozole  and exemestane .  She does not want to try any other antiestrogen therapy.   Assessment and Plan Assessment & Plan Breast tenderness Likely secondary to post op changes. No palpable mass noted on exam today No regional adenopathy Continue annual mammogram and FU.  Elbow pain Elbow pain due to irritation from resting on a driver with four wheels. - Advise elbow padding or physical barrier to alleviate pressure.  Knee pain due to arthritis Knee pain likely due to arthritis, persisting despite cessation of previous medications.  Left ventricular hypertrophy due to hypertension Left ventricular hypertrophy on echocardiogram attributed to hypertension. Heart function normal. - Advise maintaining blood pressure control.  Pericardial effusion Small pericardial effusion noted on echocardiogram, not concerning.  Weight loss Weight loss of eleven pounds attributed to medication use.    All questions were answered. The patient knows to call the clinic with any problems, questions or concerns. We can certainly see the patient much sooner if necessary.  Total encounter time:30 minutes*in face-to-face visit time, chart review, lab review, care coordination, order entry, and documentation of the encounter time.  *Total Encounter Time as defined by the Centers for Medicare and Medicaid Services includes, in addition to the face-to-face time of a patient visit (documented in the note above) non-face-to-face time: obtaining and reviewing outside history, ordering and reviewing medications, tests or procedures, care coordination (communications with other health care professionals  or caregivers) and documentation in the medical record.

## 2024-01-28 NOTE — Telephone Encounter (Unsigned)
 Copied from CRM [id number]. Topic: Clinical - Medication Refill >> Jan 28, 2024 11:00 AM Suzette B wrote: Medication:  oxyCODONE -acetaminophen  (PERCOCET) 10-325 MG tablet  Has the patient contacted their pharmacy? No she stated she had not called because she knows to call the office when it's almost time for her refill   This is the patient's preferred pharmacy:  [facility] - [place], [place] - [address] [place] [address] Phone: [phone] Fax: [phone]    Is this the correct pharmacy for this prescription? Yes If no, delete pharmacy and type the correct one.   Has the prescription been filled recently? Yes  Is the patient out of the medication? No  Has the patient been seen for an appointment in the last year OR does the patient have an upcoming appointment? Yes  Can we respond through MyChart? No  Agent: Please be advised that Rx refills may take up to 3 business days. We ask that you follow-up with your pharmacy.

## 2024-01-30 IMAGING — MG MM BREAST SURGICAL SPECIMEN
1 series · 1 of 1 positions shown · non-contrast
Comparison: Previous exam(s).

CLINICAL DATA: Left lumpectomy for invasive mammary carcinoma,
marked with a ribbon shaped biopsy marker clip radioactive seed.

EXAM:
SPECIMEN RADIOGRAPH OF THE LEFT BREAST

[L]
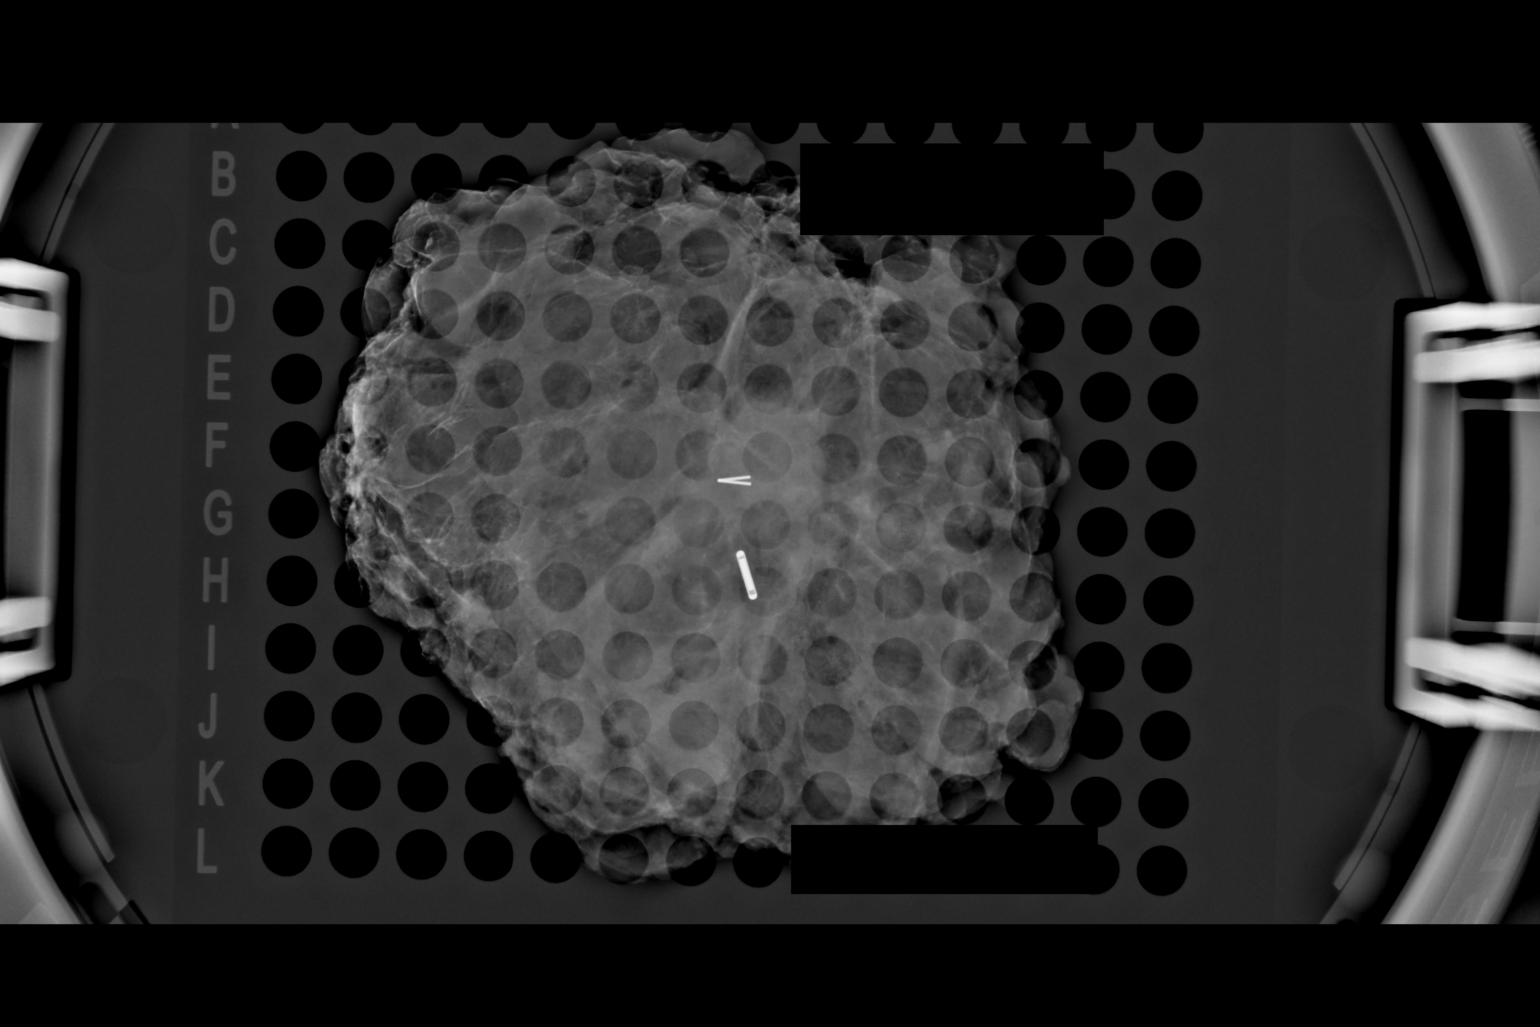

[1 of 1 positions shown; findings below may reference images not displayed]

FINDINGS: Status post excision of the left breast. The radioactive seed and
ribbon shaped biopsy marker clip are present, completely intact, and
were marked for pathology. The ribbon shaped clip is within a
previously biopsied mass.
IMPRESSION: Specimen radiograph of the left breast.

## 2024-02-05 ENCOUNTER — Other Ambulatory Visit: Payer: Self-pay | Admitting: Internal Medicine

## 2024-02-05 NOTE — Telephone Encounter (Signed)
 Copied from CRM [id number]. Topic: Clinical - Medication Refill >> Feb 05, 2024 12:03 PM Carmell R wrote: Medication: oxyCODONE -acetaminophen  (PERCOCET) 10-325 MG tablet  Has the patient contacted their pharmacy? Yes. They have not received the rx refill.  This is the patient's preferred pharmacy:  [facility] - [place], [place] - [address] [place] [address] Phone: [phone] Fax: [phone]  Is this the correct pharmacy for this prescription? Yes  Has the prescription been filled recently? Yes  Is the patient out of the medication? Yes  Has the patient been seen for an appointment in the last year OR does the patient have an upcoming appointment? Yes  Can we respond through MyChart? No  Patient advised that Rx refills may take up to 3 business days.   NOTE: Patient called in 01/28/24 for this request and the E2C2 specialist never routed the request to [facility] so now the patient will be out of medication over the weekend. 02/06/24 is the refill date. Clinic closed at noon. Please expedite the request, if possible.

## 2024-02-08 ENCOUNTER — Telehealth: Payer: Self-pay | Admitting: *Deleted

## 2024-02-08 MED ORDER — OXYCODONE-ACETAMINOPHEN 10-325 MG PO TABS
1.0000 | ORAL_TABLET | ORAL | 0 refills | Status: DC | PRN
Start: 2024-02-08 — End: 2024-03-10

## 2024-02-08 NOTE — Telephone Encounter (Signed)
 Form has been rec'd.  Per Pcp patient is sch for f/u and to address supplies on the following day:  Name: Breanna Wilkins, Breanna Wilkins MRN: [id number]  Date: 02/19/2024 Status: Sch  Time: 10:45 AM Length: 30  Visit Type: OFFICE VISIT [8002] Copay: $0.00  Provider: Norrine Sharper, MD       Copied from CRM [id number]. Topic: General - Other >> Feb 04, 2024 12:43 PM Adrianna P wrote: Reason for CRM:ben called from [facility] to check if fax was received regarding cpap supply order it was sent on 7/08

## 2024-02-08 NOTE — Telephone Encounter (Signed)
 Requesting refill for her Oxycodone . Copied from CRM [id number]. Topic: Clinical - Medication Refill >> Feb 05, 2024 12:03 PM Carmell R wrote: Medication: oxyCODONE -acetaminophen  (PERCOCET) 10-325 MG tablet  Has the patient contacted their pharmacy? Yes. They have not received the rx refill.  This is the patient's preferred pharmacy:  [facility] - [place], [place] - [address] [place] [address] Phone: [phone] Fax: [phone]  Is this the correct pharmacy for this prescription? Yes  Has the prescription been filled recently? Yes  Is the patient out of the medication? Yes  Has the patient been seen for an appointment in the last year OR does the patient have an upcoming appointment? Yes  Can we respond through MyChart? No  Patient advised that Rx refills may take up to 3 business days.   NOTE: Patient called in 01/28/24 for this request and the E2C2 specialist never routed the request to [facility] so now the patient will be out of medication over the weekend. 02/06/24 is the refill date. Clinic closed at noon. Please expedite the request, if possible. >> Feb 08, 2024  2:18 PM Carrielelia G wrote: Patient calling back on the status of her medication:  oxyCODONE -acetaminophen  (PERCOCET) 10-325 MG tablet  Please advise

## 2024-02-08 NOTE — Telephone Encounter (Signed)
 Copied from CRM [id number]. Topic: Clinical - Medication Refill >> Feb 05, 2024 12:03 PM Carmell R wrote: Medication: oxyCODONE -acetaminophen  (PERCOCET) 10-325 MG tablet  Has the patient contacted their pharmacy? Yes. They have not received the rx refill.  This is the patient's preferred pharmacy:  [facility] - [place], [place] - [address] [place] [address] Phone: [phone] Fax: [phone]  Is this the correct pharmacy for this prescription? Yes  Has the prescription been filled recently? Yes  Is the patient out of the medication? Yes  Has the patient been seen for an appointment in the last year OR does the patient have an upcoming appointment? Yes  Can we respond through MyChart? No  Patient advised that Rx refills may take up to 3 business days.   NOTE: Patient called in 01/28/24 for this request and the E2C2 specialist never routed the request to [facility] so now the patient will be out of medication over the weekend. 02/06/24 is the refill date. Clinic closed at noon. Please expedite the request, if possible. >> Feb 08, 2024  2:18 PM Carrielelia G wrote: Patient calling back on the status of her medication:  oxyCODONE -acetaminophen  (PERCOCET) 10-325 MG tablet  Please advise

## 2024-02-15 NOTE — Telephone Encounter (Signed)
 Called [facility] again and notified their Representative that this patient must come in for a visit to update compliance as to which machine she is using per PCP.  [facility] has fax over several orders and per Debaorah she will document that the order has been rec'd.  Name: Breanna Wilkins, Breanna Wilkins MRN: [id number]  Date: 02/19/2024 Status: Sch  Time: 10:45 AM Length: 30  Visit Type: OFFICE VISIT [8002] Copay: $0.00  Provider: Norrine Ozell HERO       Copied from CRM [id number]. Topic: Medical Record Request - Other >> Feb 11, 2024  1:05 PM Alfonso ORN wrote: Reason for CRM:  [facility] - [phone] 02/04/24 sent a fax , will be refaxing the request  requesting if patient is a current patient  at the [facility] and last office visit wil

## 2024-02-19 ENCOUNTER — Telehealth: Payer: Self-pay

## 2024-02-19 ENCOUNTER — Ambulatory Visit (HOSPITAL_COMMUNITY)
Admission: RE | Admit: 2024-02-19 | Discharge: 2024-02-19 | Disposition: A | Discharge status: Home / Self Care | Source: Ambulatory Visit | Attending: Family Medicine | Admitting: Family Medicine

## 2024-02-19 ENCOUNTER — Ambulatory Visit: Payer: Self-pay | Admitting: Student

## 2024-02-19 VITALS — BP 133/83 | HR 70 | Ht 67.0 in | Wt 298.0 lb

## 2024-02-19 DIAGNOSIS — Z79891 Long term (current) use of opiate analgesic: Secondary | ICD-10-CM | POA: Diagnosis not present

## 2024-02-19 DIAGNOSIS — R0789 Other chest pain: Secondary | ICD-10-CM | POA: Diagnosis not present

## 2024-02-19 DIAGNOSIS — I10 Essential (primary) hypertension: Secondary | ICD-10-CM | POA: Diagnosis not present

## 2024-02-19 DIAGNOSIS — C50912 Malignant neoplasm of unspecified site of left female breast: Secondary | ICD-10-CM | POA: Diagnosis not present

## 2024-02-19 DIAGNOSIS — G4733 Obstructive sleep apnea (adult) (pediatric): Secondary | ICD-10-CM

## 2024-02-19 DIAGNOSIS — E7849 Other hyperlipidemia: Secondary | ICD-10-CM | POA: Diagnosis not present

## 2024-02-19 DIAGNOSIS — Z6841 Body Mass Index (BMI) 40.0 and over, adult: Secondary | ICD-10-CM

## 2024-02-19 MED ORDER — ZEPBOUND 7.5 MG/0.5ML ~~LOC~~ SOAJ: 7.5000 mg | SUBCUTANEOUS | 0 refills | Status: DC

## 2024-02-19 MED ORDER — ROSUVASTATIN CALCIUM 20 MG PO TABS: 20.0000 mg | ORAL_TABLET | Freq: Every day | ORAL | 11 refills | Status: DC

## 2024-02-19 NOTE — Assessment & Plan Note (Signed)
 BP Readings from Last 3 Encounters:  02/19/24 133/83  01/21/24 (!) 116/49  12/17/23 128/64   BP looks okay, continue olmesartan  40 mg daily.

## 2024-02-19 NOTE — Progress Notes (Signed)
 Patient name: Breanna Wilkins Date of birth: 1960-10-02 Date of visit: 02/19/24  Subjective   Chief concern: chest pain past couple of days  Chest pain that throbs and goes away over last couple of days. Started when she was driving. Bothering her when laying in bed. Never had this kind of pain before. No SOB, palpitations. No worsening of pain when she was walking into the clinic.  She had perioperative NSTEMI in 2014 and LHC at that time showed non-obstructive CAD. She has hyperlipidemia, non-adherent to cholesterol medicine.  No weight loss on Zepbound . Really wants to get the weight off.  Gets knee injections but they don't seem to be helping so she's not going to have any more.  ROS per HPI  Current Outpatient Medications  Medication Instructions   aspirin  81 mg, Oral, Daily   Bempedoic Acid  180 mg, Oral, Daily   carvedilol  (COREG ) 6.25 mg, Oral, 2 times daily with meals   diclofenac  Sodium (VOLTAREN ) 1 % GEL Apply 4g to knee four times a day as needed for pain, apply 2g to shoulder as needed for pain   ezetimibe  (ZETIA ) 10 mg, Oral, Daily   furosemide  (LASIX ) 20 mg, Oral, Daily PRN   olmesartan  (BENICAR ) 40 mg, Oral, Daily   oxyCODONE -acetaminophen  (PERCOCET) 10-325 MG tablet 1 tablet, Oral, Every 4 hours PRN   oxyCODONE -acetaminophen  (PERCOCET) 10-325 MG tablet 1 tablet, Oral, Every 4 hours PRN   oxyCODONE -acetaminophen  (PERCOCET) 10-325 MG tablet 1 tablet, Oral, Every 4 hours PRN   polyethylene glycol (MIRALAX  / GLYCOLAX ) 17 g, Oral, Daily   rivaroxaban  (XARELTO ) 20 mg, Oral, Daily with supper   tamoxifen  (NOLVADEX ) 20 mg, Oral, Daily   tirzepatide  5 mg, Subcutaneous, Weekly   Vitamin D  2,000 Units, Oral, Daily     Objective  Today's Vitals   02/19/24 1032  BP: 133/83  Pulse: 70  SpO2: 98%  Weight: 298 lb (135.2 kg)  Height: 5' 7 (1.702 m)  Body mass index is 46.67 kg/m.   Physical Exam Constitutional:      Appearance: Normal appearance.  Cardiovascular:      Rate and Rhythm: Normal rate and regular rhythm.  Pulmonary:     Effort: Pulmonary effort is normal. No respiratory distress.  Musculoskeletal:        General: Tenderness (anterior chest wall tenderness) present.     Right lower leg: No edema.     Left lower leg: No edema.     Comments: Small palpable bony nodule over right proximal anterior tibia  Skin:    General: Skin is warm and dry.  Neurological:     Mental Status: She is alert.     Cranial Nerves: No facial asymmetry.  Psychiatric:        Mood and Affect: Affect normal.        Speech: Speech normal.        Behavior: Behavior normal.      Assessment & Plan   Morbid obesity with BMI of 45.0-49.9, adult [facility]) Assessment & Plan: Body mass index is 46.67 kg/m.  I recommend eliminating sugary beverages like soda, sweet tea, and juice entirely. I recommended more vegetables, lean protein, and legumes. Frozen vegetables are healthy and inexpensive. Beans are a healthy and inexpensive source of lean protein and fiber. I recommend gradually increasing exercise. A daily walk is a great way to start an exercise program.  Referral: recommend dietitian referral  Pharmacological intervention: increase zepbound  today   Orders: -     Zepbound ; Inject  7.5 mg into the skin once a week.  Dispense: 2 mL; Refill: 0  Atypical chest pain Assessment & Plan: Atypical for cardiac pain. Non-exertional, tender on exam. EKG shows sinus rhythm 65 bpm, normal axis, stable T-wave inversion in precordial leads when compared to prior. Reassurance given.  Orders: -     EKG 12-Lead  Severe obstructive sleep apnea Assessment & Plan: BiPAP was recommended but seems as though she continues to use her old CPAP machine instead. She states the new machine (suspect this was the BiPAP machine) didn't work for her, she didn't feel well after using it, and thus she gave it away. Repeat sleep study, split night at [facility].  Orders: -     Zepbound ;  Inject 7.5 mg into the skin once a week.  Dispense: 2 mL; Refill: 0 -     Split night study; Future  Essential hypertension, benign Assessment & Plan: BP Readings from Last 3 Encounters:  02/19/24 133/83  01/21/24 (!) 116/49  12/17/23 128/64   BP looks okay, continue olmesartan  40 mg daily.  Orders: -     Basic metabolic panel with GFR  Other hyperlipidemia Assessment & Plan: LDL goal <55 for this person with history of NSTEMI. She doesn't take the bempedoic acid  prescribed because the absolute dose in milligrams seems to high. I note rosuvastatin  was discontinued at some point for bone pain, but she is amenable to restarting this. We'll try again, if she doesn't tolerate it due to side-effects we'll discontinue it but she would benefit from cholesterol lowering.  Orders: -     Rosuvastatin  Calcium ; Take 1 tablet (20 mg total) by mouth daily.  Dispense: 30 tablet; Refill: 11  Long term (current) use of opiate analgesic Assessment & Plan: Toxassure and anticipate refilling on monthly basis. Next refill will be 03/10/24.  Orders: -     ToxAssure Select,+Antidepr,UR    Return in about 3 months (around 05/21/2024) for sleep study follow-up, weight loss.  Ozell Kung MD 02/19/2024, 12:56 PM

## 2024-02-19 NOTE — Telephone Encounter (Signed)
 SDOH packet was given to the patient. Patient refused to complete the form.

## 2024-02-19 NOTE — Assessment & Plan Note (Signed)
 Body mass index is 46.67 kg/m.  I recommend eliminating sugary beverages like soda, sweet tea, and juice entirely. I recommended more vegetables, lean protein, and legumes. Frozen vegetables are healthy and inexpensive. Beans are a healthy and inexpensive source of lean protein and fiber. I recommend gradually increasing exercise. A daily walk is a great way to start an exercise program.  Referral: recommend dietitian referral  Pharmacological intervention: increase zepbound  today

## 2024-02-19 NOTE — Assessment & Plan Note (Signed)
 Atypical for cardiac pain. Non-exertional, tender on exam. EKG shows sinus rhythm 65 bpm, normal axis, stable T-wave inversion in precordial leads when compared to prior. Reassurance given.

## 2024-02-19 NOTE — Patient Instructions (Signed)
 Return in about 3 months (around 05/21/2024) for sleep study follow-up, weight loss.  Start rosuvastatin  20 mg at bedtime.  Increase Zepbound  to 7.5 mg weekly.  Remember to bring all of the medications that you take (including over the counter medications and supplements) with you to every clinic visit.  This after visit summary is an important review of tests, referrals, and medication changes that were discussed during your visit. If you have questions or concerns, call [phone]. Outside of clinic business hours, call the main hospital at [phone] and ask the operator for the on-call internal medicine resident.   Ozell Kung MD 02/19/2024, 11:31 AM

## 2024-02-19 NOTE — Assessment & Plan Note (Signed)
 Toxassure and anticipate refilling on monthly basis. Next refill will be 03/10/24.

## 2024-02-19 NOTE — Assessment & Plan Note (Signed)
 LDL goal <55 for this person with history of NSTEMI. She doesn't take the bempedoic acid  prescribed because the absolute dose in milligrams seems to high. I note rosuvastatin  was discontinued at some point for bone pain, but she is amenable to restarting this. We'll try again, if she doesn't tolerate it due to side-effects we'll discontinue it but she would benefit from cholesterol lowering.

## 2024-02-19 NOTE — Assessment & Plan Note (Signed)
 BiPAP was recommended but seems as though she continues to use her old CPAP machine instead. She states the new machine (suspect this was the BiPAP machine) didn't work for her, she didn't feel well after using it, and thus she gave it away. Repeat sleep study, split night at [facility].

## 2024-02-20 LAB — BASIC METABOLIC PANEL WITH GFR
BUN/Creatinine Ratio: 27 (ref 12–28)
BUN: 25 mg/dL (ref 8–27)
CO2: 24 mmol/L (ref 20–29)
Calcium: 9.9 mg/dL (ref 8.7–10.3)
Chloride: 100 mmol/L (ref 96–106)
Creatinine, Ser: 0.93 mg/dL (ref 0.57–1.00)
Glucose: 81 mg/dL (ref 70–99)
Potassium: 4.8 mmol/L (ref 3.5–5.2)
Sodium: 139 mmol/L (ref 134–144)
eGFR: 69 mL/min/1.73 (ref >59)

## 2024-02-22 LAB — TOXASSURE SELECT,+ANTIDEPR,UR

## 2024-02-23 ENCOUNTER — Ambulatory Visit: Payer: Self-pay | Admitting: Student

## 2024-02-23 DIAGNOSIS — G4733 Obstructive sleep apnea (adult) (pediatric): Secondary | ICD-10-CM

## 2024-02-23 MED ORDER — ZEPBOUND 7.5 MG/0.5ML ~~LOC~~ SOAJ: 7.5000 mg | SUBCUTANEOUS | Status: AC

## 2024-02-23 MED ORDER — ZEPBOUND 15 MG/0.5ML ~~LOC~~ SOAJ
15.0000 mg | SUBCUTANEOUS | 3 refills | Status: DC
Start: 2024-03-22 — End: 2024-04-01

## 2024-02-23 NOTE — Telephone Encounter (Signed)
 Called to discuss results of normal labs.  She requested I go ahead and send the maximum dose of Zepbound  to pharmacy so she can start it after she finishes at her current 7.5 mg weekly dose.  Meds ordered this encounter  Medications   tirzepatide  (ZEPBOUND ) 7.5 MG/0.5ML Pen    Sig: Inject 7.5 mg into the skin once a week for 27 days.   tirzepatide  (ZEPBOUND ) 15 MG/0.5ML Pen    Sig: Inject 15 mg into the skin once a week.    Dispense:  6 mL    Refill:  3   Ozell Kung MD 02/23/2024, 2:28 PM

## 2024-02-26 ENCOUNTER — Telehealth: Payer: Self-pay | Admitting: *Deleted

## 2024-02-26 NOTE — Telephone Encounter (Signed)
 Primary Information  Source  Breanna Wilkins (Patient)   Subject  Breanna Wilkins (Patient)   Topic  Clinical - Order For Equipment    Communication  Reason for CRM: [employer] will be sending over a fax for an order for Cpap supplies. They are requesting a prescription for this as the cpap has been sent to the patient as of 01/06/24. Please contact [employer] at [phone] for additional information.

## 2024-03-07 DIAGNOSIS — G4733 Obstructive sleep apnea (adult) (pediatric): Secondary | ICD-10-CM | POA: Diagnosis not present

## 2024-03-10 ENCOUNTER — Other Ambulatory Visit: Payer: Self-pay | Admitting: *Deleted

## 2024-03-10 ENCOUNTER — Telehealth: Payer: Self-pay | Admitting: *Deleted

## 2024-03-10 MED ORDER — OXYCODONE-ACETAMINOPHEN 10-325 MG PO TABS: 1.0000 | ORAL_TABLET | ORAL | 0 refills | Status: DC | PRN

## 2024-03-10 NOTE — Telephone Encounter (Signed)
 Copied from CRM #[id number]. Topic: Clinical - Prescription Issue >> Mar 10, 2024  9:17 AM Carmell SAUNDERS wrote: Reason for CRM: PT came in and saw Dr. Dorinda 02/19/24 and the rx for oxyCODONE -acetaminophen  (PERCOCET) 10-325 MG tablet was to be sent to the [facility] in [place], [place]. Pt is currently at the pharmacy and there is no rx. She needs it sent as soon as possible. Please follow up with her once resolved. [phone]

## 2024-03-10 NOTE — Telephone Encounter (Signed)
 Sent!

## 2024-03-10 NOTE — Progress Notes (Signed)
 Internal Medicine Clinic Attending  Case discussed with the resident at the time of the visit.  We reviewed the resident's history and exam and pertinent patient test results.  I agree with the assessment, diagnosis, and plan of care documented in the resident's note.

## 2024-03-18 ENCOUNTER — Telehealth: Payer: Self-pay | Admitting: Internal Medicine

## 2024-03-18 NOTE — Telephone Encounter (Signed)
 Patient has a future prescription for medication on 03/22/24 for increased tirzepatide  dosage. Duplicate request, closing encounter.

## 2024-03-18 NOTE — Telephone Encounter (Signed)
 Copied from CRM [id number]. Topic: Clinical - Medication Refill >> Mar 18, 2024  9:10 AM Diannia H wrote: Medication: tirzepatide  (ZEPBOUND ) 7.5 MG/0.5ML Pen  Has the patient contacted their pharmacy? Yes (Agent: If no, request that the patient contact the pharmacy for the refill. If patient does not wish to contact the pharmacy document the reason why and proceed with request.) (Agent: If yes, when and what did the pharmacy advise?)  This is the patient's preferred pharmacy:  [id number] - [place], [place] - [address] [place] [address] Phone: [phone] Fax: [phone]  Is this the correct pharmacy for this prescription? Yes If no, delete pharmacy and type the correct one.   Has the prescription been filled recently? Yes  Is the patient out of the medication? Yes  Has the patient been seen for an appointment in the last year OR does the patient have an upcoming appointment? Yes  Can we respond through MyChart? Yes  Agent: Please be advised that Rx refills may take up to 3 business days. We ask that you follow-up with your pharmacy.

## 2024-03-25 ENCOUNTER — Ambulatory Visit (HOSPITAL_BASED_OUTPATIENT_CLINIC_OR_DEPARTMENT_OTHER): Attending: Internal Medicine | Admitting: Internal Medicine

## 2024-03-25 DIAGNOSIS — G4733 Obstructive sleep apnea (adult) (pediatric): Secondary | ICD-10-CM | POA: Diagnosis not present

## 2024-03-31 ENCOUNTER — Ambulatory Visit: Admitting: Student

## 2024-03-31 VITALS — BP 132/80 | HR 88 | Temp 98.4°F | Ht 67.0 in | Wt 300.4 lb

## 2024-03-31 DIAGNOSIS — M25531 Pain in right wrist: Secondary | ICD-10-CM | POA: Diagnosis not present

## 2024-03-31 DIAGNOSIS — Z833 Family history of diabetes mellitus: Secondary | ICD-10-CM | POA: Diagnosis not present

## 2024-03-31 DIAGNOSIS — Z87891 Personal history of nicotine dependence: Secondary | ICD-10-CM

## 2024-03-31 DIAGNOSIS — M5416 Radiculopathy, lumbar region: Secondary | ICD-10-CM | POA: Diagnosis not present

## 2024-03-31 DIAGNOSIS — Z6841 Body Mass Index (BMI) 40.0 and over, adult: Secondary | ICD-10-CM | POA: Diagnosis not present

## 2024-03-31 DIAGNOSIS — Z79899 Other long term (current) drug therapy: Secondary | ICD-10-CM | POA: Diagnosis not present

## 2024-03-31 DIAGNOSIS — Z7985 Long-term (current) use of injectable non-insulin antidiabetic drugs: Secondary | ICD-10-CM

## 2024-03-31 DIAGNOSIS — G4733 Obstructive sleep apnea (adult) (pediatric): Secondary | ICD-10-CM

## 2024-03-31 DIAGNOSIS — M25422 Effusion, left elbow: Secondary | ICD-10-CM

## 2024-03-31 DIAGNOSIS — I10 Essential (primary) hypertension: Secondary | ICD-10-CM

## 2024-03-31 MED ORDER — OLMESARTAN MEDOXOMIL 40 MG PO TABS: 40.0000 mg | ORAL_TABLET | Freq: Every day | ORAL | 3 refills | Status: AC

## 2024-03-31 MED ORDER — CARVEDILOL 6.25 MG PO TABS: 6.2500 mg | ORAL_TABLET | Freq: Two times a day (BID) | ORAL | 3 refills | Status: AC

## 2024-03-31 NOTE — Progress Notes (Unsigned)
 Established Patient Office Visit  Subjective   Patient ID: Breanna Wilkins, female    DOB: [birth year]  Age: 63 y.o. MRN: [id number]  No chief complaint on file.   Breanna Wilkins is a 63 y.o. who presents to the clinic for ***. Please see problem based assessment and plan for additional details.   Patient presents with a history of hypertension with a blood pressure today of 151/90. Their hypertension is***controlled on a regimen of***,***,***.  Prior BMP in August 2025 , Scr was 0.93. They do not check their blood pressure at home. Plan: -***Continue current regimen of: Coreg  6.25 mg BID, Lasix  20 mg prn, Benicar  40 mg (patient declined increase in meds or addition)   -BMP today   R wrist  L Elbox Knot Better since rubbing tiger cream Leans on a driver walker One month  Left elbow edema, no bursa   Asking for a cushion   No F/C   R Wrist Had this prior and resolved on its own  2 weeks No FC Pain to touch No pain with movement  No pain with ROM, ROM intact  No gout  2x2 ganglion?   [place] came in   Hard, tender to palpate  Aspirate???      Pain meds needs refilled and tirzepatide      Patient Active Problem List   Diagnosis Date Noted  . Impaired mobility 10/28/2023  . Dysuria 07/20/2023  . Statin intolerance 04/09/2023  . Preop examination 10/02/2021  . Genetic testing 09/27/2021  . Family history of ovarian cancer 09/12/2021  . Malignant neoplasm of upper-outer quadrant of left breast in female, estrogen receptor positive (HCC) 09/10/2021  . Neck mass 02/22/2021  . Musculoskeletal neck pain 05/22/2020  . Leg pain, posterior, left 12/21/2019  . Strain of rectus abdominis muscle 09/06/2019  . Sciatica of left side 11/10/2018  . Meralgia paraesthetica 10/20/2018  . Chronic fatigue 10/01/2018  . Diverticulosis of colon 10/17/2017  . Internal hemorrhoids without complication 10/17/2017  . History of colonic polyps 10/17/2017  . BRBPR  (bright red blood per rectum) 08/25/2017  . Chronic pain 07/10/2017  . Long term (current) use of opiate analgesic 09/17/2016  . Vitamin D  deficiency 09/17/2016  . Primary osteoarthritis of right knee 03/04/2016  . Nipple dermatitis 11/20/2015  . Prediabetes 06/07/2015  . Morbid obesity with BMI of 45.0-49.9, adult (HCC) 06/07/2015  . GERD (gastroesophageal reflux disease) 05/29/2014  . Atypical chest pain 02/23/2014  . Plantar fasciitis of right foot 02/07/2014  . Carpal tunnel syndrome of right wrist 12/05/2013  . Pain of left calf 11/03/2013  . CAD (coronary artery disease), native coronary artery   . CHF NYHA class II (symptoms with moderately strenuous activities) (HCC) 10/11/2012  . Hyperlipidemia 08/31/2012  . History of DVT (deep vein thrombosis) 03/20/2011  . Long term (current) use of anticoagulants 08/09/2010  . Supraspinatus tendon tear 02/11/2010  . History of pulmonary embolism 01/16/2010  . Severe obstructive sleep apnea 06/29/2009  . Essential hypertension, benign 06/04/2009       Objective:     BP (!) 151/90 (BP Location: Right Arm, Patient Position: Sitting, Cuff Size: Normal)   Pulse 89   Temp 98.4 F (36.9 C) (Oral)   Ht 5' 7 (1.702 m)   Wt (!) 300 lb 6.4 oz (136.3 kg)   SpO2 95%   BMI 47.05 kg/m  BP Readings from Last 3 Encounters:  03/31/24 (!) 151/90  02/19/24 133/83  01/21/24 (!) 116/49   Wt Readings  from Last 3 Encounters:  03/31/24 (!) 300 lb 6.4 oz (136.3 kg)  03/25/24 295 lb (133.8 kg)  02/19/24 298 lb (135.2 kg)      Physical Exam   No results found for any visits on 03/31/24.  Last metabolic panel Lab Results  Component Value Date   GLUCOSE 81 02/19/2024   NA 139 02/19/2024   K 4.8 02/19/2024   CL 100 02/19/2024   CO2 24 02/19/2024   BUN 25 02/19/2024   CREATININE 0.93 02/19/2024   EGFR 69 02/19/2024   CALCIUM  9.9 02/19/2024   PHOS 2.9 10/01/2012   PROT 7.4 02/23/2023   ALBUMIN 4.4 02/23/2023   LABGLOB 3.0 02/23/2023    AGRATIO 1.3 02/25/2018   BILITOT 0.3 02/23/2023   ALKPHOS 70 02/23/2023   AST 18 02/23/2023   ALT 16 02/23/2023   ANIONGAP 5 09/11/2021   Last lipids Lab Results  Component Value Date   CHOL 199 12/17/2023   HDL 46 12/17/2023   LDLCALC 133 (H) 12/17/2023   TRIG 108 12/17/2023   CHOLHDL 4.3 12/17/2023   Last hemoglobin A1c Lab Results  Component Value Date   HGBA1C 5.7 (A) 04/09/2023      The ASCVD Risk score (Arnett DK, et al., 2019) failed to calculate for the following reasons:   Risk score cannot be calculated because patient has a medical history suggesting prior/existing ASCVD    Assessment & Plan:   Problem List Items Addressed This Visit   None   No follow-ups on file.    Damien Lease, DO

## 2024-03-31 NOTE — Patient Instructions (Signed)
 Thank you, Ms.Constantina D Somes for allowing us  to provide your care today. Today we discussed blood pressure, left elbow swelling, and right wrist pain.     Referrals ordered today:   Referral Orders  No referral(s) requested today     I have ordered the following medication/changed the following medications:   Stop the following medications: Medications Discontinued During This Encounter  Medication Reason   carvedilol  (COREG ) 6.25 MG tablet Reorder   olmesartan  (BENICAR ) 40 MG tablet Reorder     Start the following medications: Meds ordered this encounter  Medications   olmesartan  (BENICAR ) 40 MG tablet    Sig: Take 1 tablet (40 mg total) by mouth daily.    Dispense:  100 tablet    Refill:  3    Place on file for next fill   carvedilol  (COREG ) 6.25 MG tablet    Sig: Take 1 tablet (6.25 mg total) by mouth 2 (two) times daily with a meal.    Dispense:  200 tablet    Refill:  3    Place on file for next fill     Follow up:3 months    Remember: Call us  if the wrist pain does not improve in the next 4 weeks or if it changes or if you develop fevers and chills. Use voltaren  gel!   Should you have any questions or concerns please call the internal medicine clinic at [phone].     Please note that our late policy has changed.  If you are more than 15 minutes late to your appointment, you may be asked to reschedule your appointment.  Dr. Kandis, D.O. [facility]

## 2024-04-01 DIAGNOSIS — M25422 Effusion, left elbow: Secondary | ICD-10-CM | POA: Insufficient documentation

## 2024-04-01 DIAGNOSIS — M25531 Pain in right wrist: Secondary | ICD-10-CM | POA: Insufficient documentation

## 2024-04-01 MED ORDER — OXYCODONE-ACETAMINOPHEN 10-325 MG PO TABS: 1.0000 | ORAL_TABLET | ORAL | 0 refills | Status: DC | PRN

## 2024-04-01 MED ORDER — ZEPBOUND 15 MG/0.5ML ~~LOC~~ SOAJ
15.0000 mg | SUBCUTANEOUS | 3 refills | Status: AC
Start: 2024-04-01 — End: ?

## 2024-04-01 NOTE — Assessment & Plan Note (Signed)
 Patient presents with a history of hypertension with a blood pressure today of 151/90, recheck of 132/80. Their hypertension is controlled on a regimen of Coreg  6.25 mg BID, Lasix  20 mg prn, Benicar  40 mg.  Prior BMP in August 2025 , Scr was 0.93. They do not check their blood pressure at home. Plan: -Continue current regimen of: Coreg  6.25 mg BID, Lasix  20 mg prn, Benicar  40 mg

## 2024-04-01 NOTE — Assessment & Plan Note (Signed)
 Patient presents with edema of the left elbow that started 1 month ago after she started to lean her elbow on her walker more.  Over the past month, she purchased Tiger cream from [facility] and has been applying this to her left elbow daily with improvement in left elbow swelling.  She denies fevers, chills, pain associated with the swelling.  On exam, there is left elbow edema surrounding the periphery of the left elbow joint.  I was unable to locate an inflamed bursa at that time.  There is no pain on palpation or decreased range of motion.  I suspect that patient's elbow edema is due to persistently leaning on her walker.  Plan: - DME referral sent for cushion for her walker -Patient was instructed to apply soft material to the walker where she leans (such as a towel or small pillow)

## 2024-04-01 NOTE — Assessment & Plan Note (Signed)
 Patient presents to the clinic with a 2-week history of pain and a sense of a lump on her anterior lateral surface of the right wrist.  Patient reported that she has had similar pain and lump in the past, in which it had resolved on its own.  She denies fevers or chills.  She denies pain with movement of her wrist, she only has pain to palpate the area where the lump is located.  On exam, there is a 2 x 2 irregularly hard mobile lump present on the anterior lateral surface of the right wrist.  Range of motion is intact.  And there is no pain with movement of the wrist or fingers.  Dr. Jeanelle examined the right wrist as well. With the history of prior episodes, I have a high suspicion for a ganglion cyst present on the anterolateral surface of the right wrist.  Plan: - Patient was instructed to use Voltaren  gel and to monitor for persistent or worsening symptoms of pain or growth of the suspected ganglion cyst. -If patient has persistent or worsening symptoms, would recommend obtaining an ultrasound

## 2024-04-02 NOTE — Procedures (Signed)
 [facility] [address] [place], [place] [address] Tel: [phone]   Fax: [phone]  Split Night Interpretation  Patient Name:  Breanna Wilkins, Breanna Wilkins Study Date:  03/25/2024 Referring Physician:  DAYTON EASTERN [phone]) %%startinterp%% Indications for Polysomnography The patient is a 63 year old Female who is 5' 7 and weighs 295.0 lbs.  Her BMI equals 46.3.  A diagnostic polysomnogram was performed to evaluate for -OSA.  After 139.5 minutes of sleep time the patient exhibited sufficient respiratory events qualifying her for a CPAP trial which was then initiated.    Medication  carvedilol    oxyCODONE -acetaminophen   rivaroxaban    Polysomnogram Data A full night polysomnogram was performed recording the standard physiologic parameters including EEG, EOG, EMG, EKG, nasal and oral airflow.  Respiratory parameters of chest and abdominal movements are recorded with Peizo-Crystal motion transducers.  Oxygen saturation was recorded by pulse oximetry.    Sleep Architecture The total recording time of the diagnostic portion of the study was 183.9 minutes.  The total sleep time was 139.5 minutes.  During the diagnostic portion of the study, the patient spent 1.1% of total sleep time in Stage N1, 33.7% in Stage N2, 49.1% in Stages N3, and 16.1% in REM.   Sleep latency was 31.9 minutes.  REM latency was 120.0 minutes.  Sleep Efficiency was 75.8%.  Wake after Sleep Onset time was 12.5 minutes.   At 02:26:00 AM the patient was placed on PAP treatment and was titrated at pressures ranging from 5* cm/H20 with supplemental oxygen at - up to 18* cm/H20 with supplemental oxygen at -.  The total recording time of the treatment portion of the study was 185.3 minutes.  The total sleep time was 148.0 minutes.  During the treatment portion of the study, the patient spent 1.4% of total sleep time in Stage N1, 70.3% in Stage N2, 28.4% in Stages N3, and 0.0% in REM.   Sleep  latency was 25.0 minutes.  REM latency was - minutes.  Sleep Efficiency was 79.8%.  Wake after Sleep Onset time was 12.5 minutes.  Respiratory Events During the diagnostic portion of the study, the polysomnogram revealed a presence of - obstructive, - central, and - mixed apneas resulting in an Apnea index of - events per hour.  There were 113 hypopneas (>=3% desaturation and/or arousal) resulting in an Apnea\Hypopnea Index (AHI >=3% desaturation and/or arousal) of 48.6 events per hour.  There were 80 hypopneas (>=4% desaturation) resulting in an Apnea\Hypopnea Index (AHI >=4% desaturation) of 34.4 events per hour.  There were 2 Respiratory Effort Related Arousals resulting in a RERA index of 0.9 events per hour. The Respiratory Disturbance Index is 49.5 events per hour.  The snore index was 67.1 events per hour.  Mean oxygen saturation was 91.6%.  The lowest oxygen saturation during sleep was 73.0%.  Time spent <=88% oxygen saturation was 19.0 minutes (10.6%).  During the treatment portion of the study, the polysomnogram revealed a presence of - obstructive, 10 central, and - mixed apneas resulting in an Apnea index of 4.1 events per hour.  There were 64 hypopneas (>=3% desaturation and/or arousal) resulting in an Apnea\Hypopnea Index (AHI >=3% desaturation and/or arousal) of 30.0 events per hour.  There were 31 hypopneas (>=4% desaturation) resulting in an Apnea\Hypopnea Index (AHI >=4% desaturation) of 16.6 events per hour.  There were 11 Respiratory Effort Related Arousals resulting in a RERA index of 4.5 events per hour. The Respiratory Disturbance Index is 34.5 events  per hour.  The snore index was 155.3 events per hour.  Mean oxygen saturation was 93.6%.  The lowest oxygen saturation during sleep was 84.0%.  Time spent <=88% oxygen saturation was 0.7 minutes (0.4%).  Limb Activity During the diagnostic portion of the study, there were - limb movements recorded.  Of this total, - were classified as  PLMs.  Of the PLMs, - were associated with arousals.  The Limb Movement index was - per hour while the PLM index was - per hour.  During the treatment portion of the study, there were - limb movements recorded.  Of this total, - were classified as PLMs.  Of the PLMs, - were associated with arousals.  The Limb Movement index was - per hour while the PLM index was - per hour.  Cardiac Summary During the diagnostic portion of the study, the average pulse rate was 60.7 bpm.  The minimum pulse rate was 54.0 bpm while the maximum pulse rate was 79.0 bpm.  During the treatment portion of the study, the average pulse rate was 59.1 bpm.  The minimum pulse rate was 53.0 bpm while the maximum pulse rate was 79.0 bpm.   Comments: Severe obstructive sleep apnea, AHI(4%) 34.4/hr. Snoring with oxygen desaturation to a nadir of 73%, mean 91.6%. CPAP titration to 18 cwp with best available control AHI(4%) 7.7/ hr, saturation nadir 93%, mean 95.9%.  Diagnosis: Obstructive sleep apnea  Recommendations: Suggest autopap 10-20 cwp or consider dedicated CPAP/ BIPAP titration sleep study. Patient wore a large ResMed N20 nasal mask with heated humidity.   This study was personally reviewed and electronically signed by: Reggy Salt, MD Accredited Board Certified in Sleep Medicine Date/Time: 04/02/24  12:36    %%endinterp%%  Split Night Report  Patient Name: Breanna Wilkins, Breanna Wilkins Study Date: 03/25/2024  Date of Birth: [birth year] Study Type: Split Night  Age: 63 year MRN #: [id number]  Sex: Female Interpreting Physician: SALT REGGY, [id number]  Height: 5' 7 Referring Physician: DAYTON HOFFMAN ([id number])  Weight: 295.0 lbs Recording Tech: Holly Neeriemer RPSGT RST  BMI: 46.3 Scoring Tech: Holly Neeriemer RPSGT RST  ESS: 12 Neck Size: 16  Mask Type RESMED N20 NASAL MASK Final Pressure: 18  Mask Size: LARGE Supplemental O2: N/A   Study Overview  DIAGNOSTIC TREATMENT  Lights Off: 11:21:50 PM Lights Off: 02:25:45 AM   Lights On: 02:25:45 AM Lights On: 05:31:06 AM  Time in Bed: 183.9 min. Time in Bed: 185.3 min.  Total Sleep Time: 139.5 min. Total Sleep Time: 148.0 min.  Sleep Efficiency: 75.8% Sleep Efficiency: 79.8%  Sleep Latency: 31.9 min. Sleep Latency: 25.0 min.  REM Latency from Sleep Onset: 120.0 min. REM Latency from Sleep Onset: - min.  Wake After Sleep Onset: 12.5 min. Wake After Sleep Onset: 12.5 min.   DIAGNOSTIC TREATMENT   Count Index  Count Index  Awakenings: 7 3.0 Awakenings: 13 5.3  Arousals: 39 16.8 Arousals: 61 24.7  AHI (>=3% Desat and/or Ar.): 113 48.6 AHI (>=3% Desat and/or Ar.): 74 30.0  AHI (>=4% Desat): 80 34.4 AHI (>=4% Desat): 41 16.6   Limb Movements: - - Limb Movements: - -  Snore: 156 67.1 Snore: 383 155.3  Desaturations: 121 52.5 Desaturations: 82 33.2  Minimum SpO2 TST: 73.0% Minimum SpO2 TST: 84.0%    Sleep Architecture   DIAGNOSTIC TREATMENT ENTIRE NIGHT  Stages Time (mins) % Sleep Time Time (mins) % Sleep Time Time (mins) % Sleep Time  Wake 44.5  37.5  82.0   Stage N1 1.5 1.1%  2.0 1.4% 3.5 1.2%  Stage N2 47.0 33.7% 104.0 70.3% 151.0 52.5%  Stage N3 68.5 49.1% 42.0 28.4% 110.5 38.4%  REM 22.5 16.1% 0.0 0.0% 22.5 7.8%   Arousal Summary   DIAGNOSTIC TREATMENT   NREM REM TST Index NREM REM TST Index  Respiratory Ar. 15 2 17  7.3 19 - 19 7.7  PLM Ar. - - - - - - - -  Isolated Limb Movement Ar. - - - - - - - -  Snore Ar. - - - - 22 - 22 8.9  Spontaneous Ar. 21 1 22  9.5 20 - 20 8.1  Total Ar. 36 3 39 16.8 61 - 61 24.7    Respiratory Summary  DIAGNOSTIC By Sleep Stage By Body Position Total   NREM REM Supine Non-Supine   Time (min) 117.0 22.5 139.5 - 139.5         Obstructive Apnea - - - - -  Mixed Apnea - - - - -  Central Apnea - - - - -  Total Apneas - - - - -  Total Apnea Index - - - - -         Hypopneas (>=3% Desat and/or Ar.) 86 27 113 - 113  AHI (>=3% Desat and/or Ar.) 44.1 72.0 48.6 - 48.6         Hypopneas (>=4% Desat) 57 23 80 - 80  AHI  (>=4% Desat) 29.2 61.3 34.4 - 34.4          RERAs 2 - 2 - 2  RERA Index 1.0 - 0.9 - 0.9         RDI 45.1 72.0 49.5 - 49.5    TREATMENT By Sleep Stage By Body Position Total   NREM REM Supine Non-Supine   Time (min) 148.0 0.0 148.0 - 148.0         Obstructive Apnea - - - - -  Mixed Apnea - - - - -  Central Apnea 10 - 10 - 10  Total Apneas 10 - 10 - 10  Total Apnea Index 4.1 - 4.1 - 4.1         Hypopneas (>=3% Desat and/or Ar.) 64 - 64 - 64  AHI (>=3% Desat and/or Ar.) 30.0 - 30.0 - 30.0         Hypopneas (>=4% Desat) 31 - 31 - 31  AHI (>=4% Desat) 16.6 - 16.6 - 16.6          RERAs 11 - 11 - 11  RERA Index 4.5 - 4.5 - 4.5         RDI 34.5 - 34.5 - 34.5    Respiratory Event Durations   DIAGNOSTIC TREATMENT  Apnea NREM REM NREM REM  Average (seconds) - - 15.9 -  Maximum (seconds) - - 20.5 -  Hypopnea      Average (seconds) 23.4 22.4 21.4 -  Maximum (seconds) 39.2 47.3 50.0 -    Limb Movement Summary   DIAGNOSTIC TREATMENT   Count Index Count Index  Isolated Limb Movements - - - -  Periodic Limb Movements (PLMs) - - - -  Total Limb Movements - - - -    Oxygen Saturation Summary   DIAGNOSTIC TREATMENT   Wake NREM REM TST Wake NREM REM TST  Average SpO2 94.1% 91.1% 89.0% 90.8% 94.8% 93.4% - 93.4%  Minimum SpO2 84.0% 74.0% 73.0% 73.0%  88.0% 84.0% - 84.0%   Maximum SpO2 98.0% 98.0% 98.0% 98.0%  99.0% 99.0% - 99.0%  DIAGNOSTIC Oxygen Saturation Distribution  Range (%) Time in range (min) Time in range (%)   90.0 - 100.0 126.8 70.5%  80.0 - 90.0 50.6 28.1%  70.0 - 80.0 2.4 1.3%  60.0 - 70.0 - -  50.0 - 60.0 - -  0.0 - 50.0 - -  Time Spent <=88% SpO2  Range (%) Time in range (min) Time in range (%)  0.0 - 88.0 19.0 10.6%      Count Index  Desaturations: 121 52.5   TREATMENT Oxygen Saturation Distribution  Range (%) Time in range (min) Time in range (%)   90.0 - 100.0 163.4 88.7%  80.0 - 90.0 20.9 11.3%  70.0 - 80.0 - -  60.0 - 70.0 - -  50.0 -  60.0 - -  0.0 - 50.0 - -  Time Spent <=88% SpO2  Range (%) Time in range (min) Time in range (%)  0.0 - 88.0 0.7 0.4%      Count Index  Desaturations: 82 33.2     Cardiac Summary   DIAGNOSTIC TREATMENT   Wake NREM REM Total Wake NREM REM Total  Average Pulse Rate (BPM) 61.7 59.7 64.2 60.7 61.0 58.6 - 59.1  Minimum Pulse Rate (BPM) 54.0 54.0 57.0 54.0 55.0 53.0 - 53.0  Maximum Pulse Rate (BPM) 78.0 78.0 79.0 79.0 79.0 72.0 - 79.0   Pulse Rate Distribution   DIAGNOSTIC  Range (bpm) Time in range (min) Time in range (%)  0.0 - 40.0 - -  40.0 - 60.0 108.3 59.3%  60.0 - 80.0 74.5 40.7%  80.0 - 100.0 - -  100.0 - 120.0 - -  120.0 - 140.0 - -  140.0 - 200.0 - -   TREATMENT  Range (bpm) Time in range (min) Time in range (%)  0.0 - 40.0 - -  40.0 - 60.0 147.0 79.6%  60.0 - 80.0 37.7 20.4%  80.0 - 100.0 - -  100.0 - 120.0 - -  120.0 - 140.0 - -  140.0 - 200.0 - -    Titration Summary  PAP Device PAP Level O2 Level Time (min) Wake (min) NREM (min) REM (min) Sleep Eff% OA# CA# MA# Hyp# (>=3%) AHI (>=3%) Hyp# (>=4%) AHI (>=%4) RERA RDI OSat <=88% (min) Min [employer] Ar. Index  - Off - 184.0 44.5 117.0 22.5 75.8% - - - 113 48.6 80  34.4 2  49.5  18.8 73.0 90.8 16.8  CPAP 5 - 34.5 25.0 9.5 0.0 27.5% - - - 10 63.2 7  44.2 -  63.2  0.0 89.0 93.4 12.6  CPAP 7 - 10.0 0.0 10.0 0.0 100.0% - - - 5 30.0 2  12.0 -  30.0  0.1 88.0 91.3 -  CPAP 8 - 18.0 0.0 18.0 0.0 100.0% - - - - - -  - -  -  0.0 89.0 90.3 -  CPAP 9 - 26.0 0.5 25.5 0.0 98.1% - 2 - 9 25.9 5  16.5 -  25.9  0.1 87.0 91.7 11.8  CPAP 10 - 5.5 0.5 5.0 0.0 90.9% - 1 - 4 60.0 3  48.0 -  60.0  0.3 84.0 91.8 36.0  CPAP 12 - 10.0 0.0 10.0 0.0 100.0% - 4 - 3 42.0 -  24.0 -  42.0  0.0 90.0 94.9 72.0  CPAP 13 - 7.5 0.0 7.5 0.0 100.0% - 1 - 4 40.0 2  24.0 -  40.0  0.1 87.0 93.7 64.0  CPAP 14 - 2.0 0.0 2.0  0.0 100.0% - - - 2 60.0 1  30.0 -  60.0  0.0 91.0 94.3 60.0  CPAP 15 - 28.5 7.0 21.5 0.0 75.4% - 1 - 7 22.3 3  11.2 4  33.5   0.1 88.0 95.0 30.7  CPAP 16 - 17.5 1.5 16.0 0.0 91.4% - 1 - 13 52.5 4  18.8 2  60.0  0.0 92.0 94.7 30.0  CPAP 17 - 7.5 0.0 7.5 0.0 100.0% - - - 2 16.0 2  16.0 -  16.0  0.0 91.0 94.5 24.0  CPAP 18 - 18.5 3.0 15.5 0.0 83.8% - - - 5 19.4 2  7.7 5  38.7  0.0 93.0 95.9 27.1    Hypnograms                           Technologist Comments  The patient is a 63 y/o female in the [facility] for a Split-Night study order. Patient indications are severe OSA. Patient currently uses a CPAP machine.  The study was performed in Sleep Room 2.  The patient arrived with no complaints. She was explained the study and process. Patient did not need to be desensitized or fitted for a mask. She requested to use the same mask she currently uses at home: ResMed N20 nasal mask, Large cushion size.  The patient met split night criteria and CPAP was applied. The patient was observed sleeping supine on 4 pillows. She was observed to have apneas and hypopneas with snoring. Pressures were increased for this. A chin strap was applied due to oral venting which occurred after pressures of 12.   Apneic and hypopneic events were decreased but not eliminated. SpO2 saturations were improved. Snore and snore related arousals/RERAs continued through final pressure. Highest and final pressure: 18 cm/H2O, EPR 3.  No restroom visits were taken during the study ECG: Sinus Rhythm PLMs/PLMas were rare No parasomnias were observed The patient self administered her own medications at [facility] Diplomate, American Board of Sleep Medicine  ELECTRONICALLY SIGNED ON:  04/02/2024, 12:23 PM [facility] PH: [phone]   FX: [phone] ACCREDITED BY THE AMERICAN ACADEMY OF SLEEP MEDICINE

## 2024-04-02 NOTE — Procedures (Signed)
 Indications for Polysomnography The patient is a 63 year old Female who is 5' 7 and weighs 295.0 lbs.  Her BMI equals 46.3.  A diagnostic polysomnogram was performed to evaluate for -.  After 139.5 minutes of sleep time the patient exhibited sufficient respiratory events qualifying  her for a CPAP trial which was then initiated.  Medicationcarvedilol oxyCODONE -acetaminophenrivaroxaban Polysomnogram Data A full night polysomnogram was performed recording the standard physiologic parameters including EEG, EOG, EMG, EKG, nasal and oral airflow.  Respiratory parameters of chest and abdominal movements are recorded with Peizo-Crystal motion transducers.   Oxygen saturation was recorded by pulse oximetry.  Sleep Architecture The total recording time of the diagnostic portion of the study was 183.9 minutes.  The total sleep time was 139.5 minutes.  During the diagnostic portion of the study, the patient spent 1.1% of total sleep time in Stage N1, 33.7% in Stage N2, 49.1% in  Stages N3, and 16.1% in REM.   Sleep latency was 31.9 minutes.  REM latency was 120.0 minutes.  Sleep Efficiency was 75.8%.  Wake after Sleep Onset time was 12.5 minutes.  At 02:26:00 AM the patient was placed on PAP treatment and was titrated at pressures ranging from 5* cm/H20 with supplemental oxygen at - up to 18* cm/H20 with supplemental oxygen at -.  The total recording time of the treatment portion of the study was  185.3 minutes.  The total sleep time was 148.0 minutes.  During the treatment portion of the study, the patient spent 1.4% of total sleep time in Stage N1, 70.3% in Stage N2, 28.4% in Stages N3, and 0.0% in REM.   Sleep latency was 25.0 minutes.  REM  latency was - minutes.  Sleep Efficiency was 79.8%.  Wake after Sleep Onset time was 12.5 minutes.  Respiratory Events During the diagnostic portion of the study, the polysomnogram revealed a presence of - obstructive, - central, and - mixed apneas resulting in an  Apnea index of - events per hour.  There were 113 hypopneas (GreaterEqual to3% desaturation and/or arousal)  resulting in an Apnea\Hypopnea Index (AHI GreaterEqual to3% desaturation and/or arousal) of 48.6 events per hour.  There were 80 hypopneas (GreaterEqual to4% desaturation) resulting in an Apnea\Hypopnea Index (AHI GreaterEqual to4% desaturation) of 34.4  events per hour.  There were 2 Respiratory Effort Related Arousals resulting in a RERA index of 0.9 events per hour. The Respiratory Disturbance Index is 49.5 events per hour.  The snore index was 67.1 events per hour.  Mean oxygen saturation was 91.6%.   The lowest oxygen saturation during sleep was 73.0%.  Time spent LessEqual to88% oxygen saturation was  minutes ().  During the treatment portion of the study, the polysomnogram revealed a presence of - obstructive, 10 central, and - mixed apneas resulting in an Apnea index of 4.1 events per hour.  There were 64 hypopneas (GreaterEqual to3% desaturation and/or arousal)  resulting in an Apnea\Hypopnea Index (AHI GreaterEqual to3% desaturation and/or arousal) of 30.0 events per hour.  There were 31 hypopneas (GreaterEqual to4% desaturation) resulting in an Apnea\Hypopnea Index (AHI GreaterEqual to4% desaturation) of 16.6  events per hour.  There were 11 Respiratory Effort Related Arousals resulting in a RERA index of 4.5 events per hour. The Respiratory Disturbance Index is 34.5 events per hour.  The snore index was 155.3 events per hour.  Mean oxygen saturation was  93.6%.  The lowest oxygen saturation during sleep was 84.0%.  Time spent LessEqual to88% oxygen saturation was  minutes ().  Limb Activity During the diagnostic portion of the study, there were - limb movements recorded.  Of this total, - were classified as PLMs.  Of the PLMs, - were associated with arousals.  The Limb Movement index was - per hour while the PLM index was - per hour.  During the treatment portion of the study, there  were - limb movements recorded.  Of this total, - were classified as PLMs.  Of the PLMs, - were associated with arousals.  The Limb Movement index was - per hour while the PLM index was - per hour.  Cardiac Summary During the diagnostic portion of the study, the average pulse rate was 60.7 bpm.  The minimum pulse rate was 54.0 bpm while the maximum pulse rate was 79.0 bpm.  During the treatment portion of the study, the average pulse rate was 59.1 bpm.  The minimum pulse rate was 53.0 bpm while the maximum pulse rate was 79.0 bpm.  Comments:  Diagnosis:  Recommendations:   This study was personally reviewed and electronically signed by: Reggy Salt, MD Accredited Board Certified in Sleep Medicine Date/Time:

## 2024-04-04 NOTE — Progress Notes (Signed)
 Internal Medicine Clinic Attending  I was physically present during the key portions of the resident provided service and participated in the medical decision making of patient's management care. I reviewed pertinent patient test results.  The assessment, diagnosis, and plan were formulated together and I agree with the documentation in the resident's note.  Jeanelle Layman CROME, MD

## 2024-04-05 ENCOUNTER — Other Ambulatory Visit: Payer: Self-pay | Admitting: Student

## 2024-04-05 NOTE — Telephone Encounter (Signed)
 Call to pharmacy  Pt has rx on file already, but dated for 9/20 Attempted to contact pt No answer Left message for return call

## 2024-04-05 NOTE — Telephone Encounter (Signed)
 Copied from CRM [id number]. Topic: Clinical - Medication Refill >> Apr 05, 2024 11:31 AM Miquel SAILOR wrote: Medication: oxyCODONE -acetaminophen  (PERCOCET) 10-325 MG tablet (Starting on 04/09/2024)   Has the patient contacted their pharmacy? Yes (Agent: If no, request that the patient contact the pharmacy for the refill. If patient does not wish to contact the pharmacy document the reason why and proceed with request.) (Agent: If yes, when and what did the pharmacy advise?)  This is the patient's preferred pharmacy:  [id number] - [place], [place] - [address] [place] [address] Phone: [phone] Fax: [phone]   Is this the correct pharmacy for this prescription? Yes If no, delete pharmacy and type the correct one.   Has the prescription been filled recently? Yes  Is the patient out of the medication? Yes  Has the patient been seen for an appointment in the last year OR does the patient have an upcoming appointment? Yes  Can we respond through MyChart? Yes  Agent: Please be advised that Rx refills may take up to 3 business days. We ask that you follow-up with your pharmacy.

## 2024-04-06 DIAGNOSIS — X58XXXA Exposure to other specified factors, initial encounter: Secondary | ICD-10-CM | POA: Diagnosis not present

## 2024-04-06 DIAGNOSIS — M249 Joint derangement, unspecified: Secondary | ICD-10-CM | POA: Diagnosis not present

## 2024-04-06 DIAGNOSIS — M222X1 Patellofemoral disorders, right knee: Secondary | ICD-10-CM | POA: Diagnosis not present

## 2024-04-06 DIAGNOSIS — G5601 Carpal tunnel syndrome, right upper limb: Secondary | ICD-10-CM | POA: Diagnosis not present

## 2024-04-06 DIAGNOSIS — M170 Bilateral primary osteoarthritis of knee: Secondary | ICD-10-CM | POA: Diagnosis not present

## 2024-04-06 DIAGNOSIS — R2231 Localized swelling, mass and lump, right upper limb: Secondary | ICD-10-CM | POA: Diagnosis not present

## 2024-04-06 DIAGNOSIS — M222X2 Patellofemoral disorders, left knee: Secondary | ICD-10-CM | POA: Diagnosis not present

## 2024-04-06 DIAGNOSIS — M25531 Pain in right wrist: Secondary | ICD-10-CM | POA: Diagnosis not present

## 2024-04-06 DIAGNOSIS — S638X1A Sprain of other part of right wrist and hand, initial encounter: Secondary | ICD-10-CM | POA: Diagnosis not present

## 2024-04-07 ENCOUNTER — Encounter: Payer: Self-pay | Admitting: Neurology

## 2024-04-08 ENCOUNTER — Telehealth: Payer: Self-pay | Admitting: Pharmacy Technician

## 2024-04-08 ENCOUNTER — Other Ambulatory Visit (HOSPITAL_COMMUNITY): Payer: Self-pay

## 2024-04-08 ENCOUNTER — Encounter (HOSPITAL_BASED_OUTPATIENT_CLINIC_OR_DEPARTMENT_OTHER): Payer: Self-pay | Admitting: Internal Medicine

## 2024-04-08 ENCOUNTER — Ambulatory Visit (HOSPITAL_BASED_OUTPATIENT_CLINIC_OR_DEPARTMENT_OTHER): Admitting: Internal Medicine

## 2024-04-08 VITALS — BP 133/72 | HR 80 | Resp 17 | Ht 67.0 in | Wt 296.0 lb

## 2024-04-08 DIAGNOSIS — Z79899 Other long term (current) drug therapy: Secondary | ICD-10-CM

## 2024-04-08 DIAGNOSIS — Z789 Other specified health status: Secondary | ICD-10-CM

## 2024-04-08 DIAGNOSIS — E7849 Other hyperlipidemia: Secondary | ICD-10-CM

## 2024-04-08 DIAGNOSIS — I2510 Atherosclerotic heart disease of native coronary artery without angina pectoris: Secondary | ICD-10-CM

## 2024-04-08 MED ORDER — REPATHA SURECLICK 140 MG/ML ~~LOC~~ SOAJ: 140.0000 mg | SUBCUTANEOUS | 6 refills | Status: DC

## 2024-04-08 NOTE — Patient Instructions (Signed)
 Medication Instructions:   STOP TAKING ROSUVASTATIN  (CRESTOR )  Dr. Mona recommends REPATHA  (PCSK9). This is an injectable cholesterol medication self-administered once every 14 days. This medication will likely need prior approval with your insurance company, which we will work on. If the medication is not approved initially, we may need to do an appeal with your insurance. If approved, we will provide you with copay and cost information. We'll then send the prescription to your pharmacy. We would have you complete another set of fasting labs between 3-4 months to reassess cholesterol.   REPATHA  is self-injected once every 14 days in subcutaneous or fatty tissue - such as belly or side/outer/upper thigh. It is best stored in the refrigerator but is stable at room temp up to 28 days. Please take the pen-injector out of fridge about 30 minutes - 1 hour prior to injection, to allow it to warm closer to room temperature.   This medication is very effective in lowering LDL and can lower LPa, as Dr. Mona mentioned. It is also generally well tolerated -- most common reaction may be cold-like symptoms such as runny nose, scratchy throat, as this is an antibody therapy. It is generally self-limiting and after a few doses, your body should have normalized to the medication.   Here is a demo video: https://www.repatha .com/how-to-start-repatha -injection   If you need a co-pay card for Repatha : https://www.repatha .com/repatha -cost If you need a co-pay card for Praluent: [email or web address]://[email or web address]/  Patient Assistance:    These foundations have funds at various times.   The [employer] Foundation: https://www.panfoundation.org/disease-funds/hypercholesterolemia/ -- can sign up for wait list  The [facility] offers assistance to help pay for medication copays.  They will cover copays for all cholesterol lowering meds, including statins, fibrates, omega-3 fish oils like Vascepa, ezetimibe ,  Repatha , Praluent, Nexletol , Nexlizet.  The cards are usually good for $2,500 or 12 months, whichever comes first. Our fax # is [phone] (you will need this to apply) Go to healthwellfoundation.org Click on "Apply Now" Answer questions as to whom is applying (patient or representative) Your disease fund will be "hypercholesterolemia - Medicare access" They will ask questions about finances and which medications you are taking for cholesterol When you submit, the approval is usually within minutes.  You will need to print the card information from the site You will need to show this information to your pharmacy, they will bill your Medicare Part D plan first -then bill [employer] Well --for the copay.   You can also call them at [phone], although the hold times can be quite long.     *If you need a refill on your cardiac medications before your next appointment, please call your pharmacy*   Lab Work:  IN 3 MONTHS HERE AT [facility] ON THE 3RD FLOOR [address]--NMR LIPOPROFILE AND LIPOPROTEIN A--PLEASE COME FASTING TO THIS LAB APPOINTMENT  If you have labs (blood work) drawn today and your tests are completely normal, you will receive your results only by: MyChart Message (if you have MyChart) OR A paper copy in the mail If you have any lab test that is abnormal or we need to change your treatment, we will call you to review the results.    Follow-Up:  IN 3 MONTHS WITH DR. HILTY AT [address]--GET EKG AT THIS VISIT--PLEASE HAVE YOUR LABS DONE PRIOR TO THIS APPOINTMENT

## 2024-04-08 NOTE — Telephone Encounter (Signed)
   Pharmacy Patient Advocate Encounter   Received notification from STAFF that prior authorization for REPATHA  is required/requested.   Insurance verification completed.   The patient is insured through [facility] .   Per test claim: PA required; PA submitted to above mentioned insurance via Latent Key/confirmation #/EOC [id number] Status is pending

## 2024-04-08 NOTE — Telephone Encounter (Signed)
 Pt aware of PA approval for repatha  and rx sent to [facility] in [place], per pt request.   Pt verbalized understanding and agrees with this plan.

## 2024-04-08 NOTE — Addendum Note (Signed)
 Addended by: GLADIS PORTER HERO on: 04/08/2024 02:43 PM   Modules accepted: Orders

## 2024-04-08 NOTE — Progress Notes (Signed)
 LIPID CLINIC CONSULT NOTE  Chief Complaint:  Manage dyslipidemia  Primary Care Physician: Rosan Dayton BROCKS, DO  Primary Cardiologist:  Victory LELON Claudene DOUGLAS, MD (Inactive)  HPI:  Breanna Wilkins is a 63 y.o. female who is being seen today for the evaluation of dyslipidemia at the request of Francesco Elsie NOVAK, MD. this is a pleasant 63 year old female previously followed by Dr. Claudene with a history of coronary artery disease and prior NSTEMI, congestive heart failure, hypertension, dyslipidemia and prior pulmonary embolus on longstanding anticoagulation with Xarelto .  She was referred for evaluation management of dyslipidemia.  She had lipid testing in May 2025 showing total cholesterol 199, triglycerides 108, HDL 46 and LDL 133.  Her target LDL should be less than 70 or possibly less than 55.  She is currently on lipid-lowering therapy with rosuvastatin  20 mg daily and ezetimibe  10 mg daily, but reports she may be having some myalgias symptoms.  PMHx:  Past Medical History:  Diagnosis Date   Abdominal hernia    Arthritis    Breast cancer (HCC)    CAD (coronary artery disease)    Perioperative non-STEMI March, 2014  //   cardiac catheterization at time of non-STEMI, March, 2014, minor nonobstructive coronary disease, vigorous LV function, possibly vasospastic event versus stress cardiomyopathy. No further workup of coronary disease   CHF (congestive heart failure) (HCC)    Diverticulosis of colon 10/17/2017   Noted on colonoscopy 10/14/17   Ejection fraction    65-70%, vigorous function, echo, March, 2011   Ejection fraction    Vigorous function at time of catheterization October 06, 2012,   GERD (gastroesophageal reflux disease)    History of colonic polyps 10/17/2017   Colonoscopy 10/14/17   Hyperlipidemia    Hypertension    Internal hemorrhoids without complication 10/17/2017   Noted on colonoscopy 10/14/17   Lung nodule 12/2009   Very small, left upper lobe by CT June, 2011,  one-year followup was stable   Myocardial infarction (HCC) 09/2013   OSA (obstructive sleep apnea)    CPAP   Overweight(278.02)    Pulmonary embolus (HCC) 01/16/2010   on Coumadin  indefinitely   Shortness of breath    Shoulder pain    Sleep apnea    Warfarin anticoagulation     Past Surgical History:  Procedure Laterality Date   ABDOMINAL HYSTERECTOMY  07/2002   laparoscopic assisted vaginal hysterectomy for menorrhagia, dysmenorrhea, anemia, fibroids   BREAST BIOPSY Left 09/03/2021   BREAST LUMPECTOMY     BREAST LUMPECTOMY WITH RADIOACTIVE SEED AND SENTINEL LYMPH NODE BIOPSY Left 10/03/2021   Procedure: LEFT BREAST LUMPECTOMY WITH RADIOACTIVE SEED AND SENTINEL LYMPH NODE BIOPSY;  Surgeon: Aron Shoulders, MD;  Location: [facility] OR;  Service: General;  Laterality: Left;   COLOSTOMY     INCISIONAL HERNIA REPAIR N/A 09/27/2012   Procedure: LAPAROSCOPIC INCISIONAL HERNIA possible open;  Surgeon: Elon CHRISTELLA Pacini, MD;  Location: [facility] OR;  Service: General;  Laterality: N/A;   INSERTION OF MESH N/A 09/27/2012   Procedure: INSERTION OF MESH;  Surgeon: Elon CHRISTELLA Pacini, MD;  Location: [facility] OR;  Service: General;  Laterality: N/A;   KNEE ARTHROSCOPY Left    LEFT HEART CATHETERIZATION WITH CORONARY ANGIOGRAM N/A 10/06/2012   Procedure: LEFT HEART CATHETERIZATION WITH CORONARY ANGIOGRAM;  Surgeon: Ozell Fell, MD;  Location: [facility] CATH LAB;  Service: Cardiovascular;  Laterality: N/A;    FAMHx:  Family History  Problem Relation Age of Onset   Diabetes Mother    Ovarian  cancer Mother 76   Diabetes Father    Diabetes Sister    Colon polyps Sister    Brain cancer Maternal Aunt        unsure if it was a brain primary or cancer that metastasized to the brain   Cancer Paternal Uncle        unknown type   Bone cancer Maternal Grandmother        unsure if it was a bone primary or cancer that metastasized to the bones   Breast cancer Neg Hx    Colon cancer Neg Hx    Esophageal cancer Neg Hx     Rectal cancer Neg Hx    Stomach cancer Neg Hx     SOCHx:   reports that she quit smoking about 35 years ago. Her smoking use included cigarettes. She has never been exposed to tobacco smoke. She has never used smokeless tobacco. She reports current alcohol use of about 1.0 standard drink of alcohol per week. She reports that she does not use drugs.  ALLERGIES:  No Known Allergies  ROS: Pertinent items noted in HPI and remainder of comprehensive ROS otherwise negative.  HOME MEDS: Current Outpatient Medications on File Prior to Visit  Medication Sig Dispense Refill   aspirin  81 MG chewable tablet Chew 1 tablet (81 mg total) by mouth daily. 30 tablet 1   carvedilol  (COREG ) 6.25 MG tablet Take 1 tablet (6.25 mg total) by mouth 2 (two) times daily with a meal. 200 tablet 3   Cholecalciferol (VITAMIN D ) 50 MCG (2000 UT) CAPS Take 2,000 Units by mouth daily.     exemestane  (AROMASIN ) 25 MG tablet Take 25 mg by mouth daily after breakfast.     ezetimibe  (ZETIA ) 10 MG tablet Take 1 tablet (10 mg total) by mouth daily. 100 tablet 3   furosemide  (LASIX ) 20 MG tablet Take 1 tablet (20 mg total) by mouth daily as needed. 100 tablet 1   olmesartan  (BENICAR ) 40 MG tablet Take 1 tablet (40 mg total) by mouth daily. 100 tablet 3   [START ON 04/09/2024] oxyCODONE -acetaminophen  (PERCOCET) 10-325 MG tablet Take 1 tablet by mouth every 4 (four) hours as needed for pain. 130 tablet 0   polyethylene glycol (MIRALAX  / GLYCOLAX ) packet Take 17 g by mouth daily.     rivaroxaban  (XARELTO ) 20 MG TABS tablet Take 1 tablet (20 mg total) by mouth daily with supper. 100 tablet 3   rosuvastatin  (CRESTOR ) 20 MG tablet Take 1 tablet (20 mg total) by mouth daily. 30 tablet 11   tirzepatide  (ZEPBOUND ) 15 MG/0.5ML Pen Inject 15 mg into the skin once a week. 6 mL 3   Bempedoic Acid  180 MG TABS Take 1 tablet (180 mg total) by mouth daily in the afternoon. (Patient not taking: Reported on 04/08/2024) 30 tablet 2   diclofenac   Sodium (VOLTAREN ) 1 % GEL Apply 4g to knee four times a day as needed for pain, apply 2g to shoulder as needed for pain (Patient not taking: Reported on 04/08/2024) 350 g 3   No current facility-administered medications on file prior to visit.    LABS/IMAGING: No results found for this or any previous visit (from the past 48 hours). No results found.  LIPID PANEL:    Component Value Date/Time   CHOL 199 12/17/2023 1346   TRIG 108 12/17/2023 1346   HDL 46 12/17/2023 1346   CHOLHDL 4.3 12/17/2023 1346   CHOLHDL 3.9 10/17/2013 1016   VLDL 20 10/17/2013 1016  LDLCALC 133 (H) 12/17/2023 1346    No results found for: LIPOA   WEIGHTS: Wt Readings from Last 3 Encounters:  04/08/24 296 lb (134.3 kg)  03/31/24 (!) 300 lb 6.4 oz (136.3 kg)  03/25/24 295 lb (133.8 kg)    VITALS: BP 133/72 (BP Location: Left Arm, Patient Position: Sitting, Cuff Size: Large)   Pulse 80   Resp 17   Ht 5' 7 (1.702 m)   Wt 296 lb (134.3 kg)   SpO2 99%   BMI 46.36 kg/m   EXAM: Deferred  EKG: Deferred  ASSESSMENT: Dyslipidemia, goal LDL less than 70 Coronary artery disease Hypertension Pulmonary embolism OSA  PLAN: 1.   Ms. Dirusso has a history of coronary artery disease, hypertension, sleep apnea and prior DVT and PE on longstanding anticoagulation with Xarelto .  She also has chronic diastolic heart failure.  She was last seen by cardiology in 2021.  She is now referred for dyslipidemia.  Her LDL remains above goal less than 70.  She is on high intensity rosuvastatin  and ezetimibe , however she has been having some side effects on rosuvastatin .  I would advise adding Repatha  to her regimen.  She is agreeable to proceed with this.  Plan repeat lipids (including NMR and LP(a)) about 3 months on therapy and follow-up with me for general cardiology at which time we will get an EKG.  We may consider an alternative additional therapy depending on her lab work once she is established on  Repatha .  Breanna KYM Maxcy, MD, [facility], FNLA, FACP  [facility]  Medical Director of the Advanced Lipid Disorders &  Cardiovascular Risk Reduction Clinic Diplomate of the American Board of Clinical Lipidology Attending Cardiologist  Direct Dial: [phone]  Fax: [phone]  Website:  [email or web address] Breanna Wilkins Corynne Scibilia 04/08/2024, 12:04 PM

## 2024-04-08 NOTE — Telephone Encounter (Signed)
 Pharmacy Patient Advocate Encounter  Received notification from [employer] that Prior Authorization for repatha  has been APPROVED from 04/08/24 to 10/06/24   PA #/Case ID/Reference #: [id number]

## 2024-04-25 ENCOUNTER — Telehealth: Payer: Self-pay | Admitting: Internal Medicine

## 2024-04-25 NOTE — Telephone Encounter (Signed)
 Pt reports that she has been experiencing achy joints with the use of Repatha  and she does not want to continue this medication. Please advise further steps.

## 2024-04-25 NOTE — Telephone Encounter (Signed)
 Pt c/o medication issue:  1. Name of Medication:   Evolocumab  (REPATHA  SURECLICK) 140 MG/ML SOAJ    2. How are you currently taking this medication (dosage and times per day)?    3. Are you having a reaction (difficulty breathing--STAT)? no  4. What is your medication issue? Patient states that shot she received is giving her problems with her needs. Patient does wish to continue taking the medication. Please advise

## 2024-04-27 NOTE — Telephone Encounter (Signed)
 Breanna Vinie BROCKS, MD to [facility] Triage  Takyla Kuchera M, RN (Selected Message) 04/27/24  8:13 AM  I'm not sure it is the Repatha  - this is very unlikely. It may likely be the rosuvastatin . I would advise stopping the rosuvastatin  for 2 weeks to see if it improves - if the symptoms are better, then re-try the Repatha . Continue the zetia .   Dr. Mona

## 2024-04-27 NOTE — Telephone Encounter (Signed)
 Called patient -- she stopped rosuvastatin  several months ago. She reports this med was make her joints ache.   She is due for a Repatha  dose today. Advised to hold. Advised to call back w/update on symptoms before her next dose is due.   She is doing OK with zetia .

## 2024-05-02 ENCOUNTER — Ambulatory Visit (INDEPENDENT_AMBULATORY_CARE_PROVIDER_SITE_OTHER): Payer: Self-pay | Admitting: Internal Medicine

## 2024-05-02 ENCOUNTER — Encounter: Payer: Self-pay | Admitting: Internal Medicine

## 2024-05-02 VITALS — BP 123/61 | HR 73 | Temp 97.9°F | Ht 67.0 in | Wt 296.2 lb

## 2024-05-02 DIAGNOSIS — I509 Heart failure, unspecified: Secondary | ICD-10-CM | POA: Diagnosis not present

## 2024-05-02 DIAGNOSIS — G8929 Other chronic pain: Secondary | ICD-10-CM

## 2024-05-02 DIAGNOSIS — M1711 Unilateral primary osteoarthritis, right knee: Secondary | ICD-10-CM | POA: Diagnosis not present

## 2024-05-02 DIAGNOSIS — Z79899 Other long term (current) drug therapy: Secondary | ICD-10-CM

## 2024-05-02 DIAGNOSIS — Z833 Family history of diabetes mellitus: Secondary | ICD-10-CM

## 2024-05-02 DIAGNOSIS — Z79620 Long term (current) use of immunosuppressive biologic: Secondary | ICD-10-CM | POA: Diagnosis not present

## 2024-05-02 DIAGNOSIS — E7849 Other hyperlipidemia: Secondary | ICD-10-CM

## 2024-05-02 DIAGNOSIS — Z79891 Long term (current) use of opiate analgesic: Secondary | ICD-10-CM

## 2024-05-02 DIAGNOSIS — Z6841 Body Mass Index (BMI) 40.0 and over, adult: Secondary | ICD-10-CM

## 2024-05-02 MED ORDER — OXYCODONE-ACETAMINOPHEN 10-325 MG PO TABS: 1.0000 | ORAL_TABLET | ORAL | 0 refills | Status: DC | PRN

## 2024-05-02 MED ORDER — FUROSEMIDE 20 MG PO TABS: 20.0000 mg | ORAL_TABLET | Freq: Every day | ORAL | 1 refills | Status: DC | PRN

## 2024-05-04 ENCOUNTER — Encounter (INDEPENDENT_AMBULATORY_CARE_PROVIDER_SITE_OTHER): Payer: Self-pay

## 2024-05-04 DIAGNOSIS — M791 Myalgia, unspecified site: Secondary | ICD-10-CM | POA: Diagnosis not present

## 2024-05-04 DIAGNOSIS — M222X1 Patellofemoral disorders, right knee: Secondary | ICD-10-CM | POA: Diagnosis not present

## 2024-05-04 DIAGNOSIS — M1711 Unilateral primary osteoarthritis, right knee: Secondary | ICD-10-CM | POA: Diagnosis not present

## 2024-05-04 DIAGNOSIS — M222X2 Patellofemoral disorders, left knee: Secondary | ICD-10-CM | POA: Diagnosis not present

## 2024-05-05 NOTE — Assessment & Plan Note (Signed)
 Reviewed PDMP, Last UTox appropriate on 02/19/24

## 2024-05-05 NOTE — Assessment & Plan Note (Signed)
 Discussed that Zetia  alone unlikely to get her cholesterol under control.  Would encourage her to retry Repatha ,  she has follow up with Dr Mona in december

## 2024-05-05 NOTE — Progress Notes (Signed)
 Subjective:  HPI: Chief Complaint  Patient presents with   Knee Pain    R > L; after getting repatha  injection.   Check Kidney    Discussed the use of AI scribe software for clinical note transcription with the patient, who gave verbal consent to proceed.  History of Present Illness Breanna Wilkins is a 63 year old female with hyperlipidemia who presents with concerns about side effects from Repatha .  She experiences significant knee pain and difficulty getting up after receiving a Repatha  injection for cholesterol management. The pain is severe enough to impact her mobility, stating 'I can't hardly get up.' She had been doing well after knee injections but experienced worsening symptoms following the Repatha  shot.  She continues taking Zetia , which does not cause her any issues. Her cholesterol levels remain high, with an LDL of 133 mg/dL.  She is also managing arthritis, which she suspects may be contributing to her knee pain. She has a history of knee problems and has been receiving knee injections, which previously helped with her symptoms.  She is currently taking Zetia  and mentions taking a 'fluid pill' and pain medication, for which she needs refills. She emphasizes the importance of pain medication in managing her daily activities, stating 'sometime without them pain pills, I don't even know how to get up sometimes.'     Please see Assessment and Plan below for the status of her chronic medical problems.  Objective:  Physical Exam: Vitals:   05/02/24 1106  BP: 123/61  Pulse: 73  Temp: 97.9 F (36.6 C)  TempSrc: Oral  SpO2: 96%  Weight: 296 lb 3.2 oz (134.4 kg)  Height: 5' 7 (1.702 m)   Body mass index is 46.39 kg/m. Physical Exam Vitals and nursing note reviewed.  Constitutional:      Appearance: Normal appearance. She is obese.  Cardiovascular:     Rate and Rhythm: Normal rate and regular rhythm.  Pulmonary:     Effort: Pulmonary effort is normal.      Breath sounds: Normal breath sounds.  Musculoskeletal:     Right knee: No effusion. Tenderness present over the medial joint line and lateral joint line.     Left knee: No effusion. Tenderness present over the medial joint line and lateral joint line.  Neurological:     Mental Status: She is alert.  Psychiatric:        Mood and Affect: Mood normal.    Results LABS Creatinine: 1 mg/dL LDL: 866 mg/dL No results found for any visits on 05/02/24.  The ASCVD Risk score (Arnett DK, et al., 2019) failed to calculate for the following reasons:   Risk score cannot be calculated because patient has a medical history suggesting prior/existing ASCVD  Assessment & Plan:  See Encounters Tab for problem based charting.    Assessment & Plan CHF NYHA class II (symptoms with moderately strenuous activities), unspecified failure chronicity, unspecified type (HCC) Stable, continue lasix  prn. Orders:   furosemide  (LASIX ) 20 MG tablet; Take 1 tablet (20 mg total) by mouth daily as needed.  Morbid obesity with BMI of 45.0-49.9, adult (HCC) BMI 46, continue zepbound  15mg .    Primary osteoarthritis of right knee Discussed importance of weight loss, discussed repatha  may not necessarily be cause of worsened pain, retrial of it would be appropriate.  She is following with [facility] orthopedics in [place]    Other chronic pain Stable on chronic oxycodoone.  Continue #130 per month.      Long term (current)  use of opiate analgesic Reviewed PDMP, Last UTox appropriate on 02/19/24    Other hyperlipidemia Discussed that Zetia  alone unlikely to get her cholesterol under control.  Would encourage her to retry Repatha ,  she has follow up with Dr Mona in december      Medications Ordered Meds ordered this encounter  Medications   furosemide  (LASIX ) 20 MG tablet    Sig: Take 1 tablet (20 mg total) by mouth daily as needed.    Dispense:  100 tablet    Refill:  1   oxyCODONE -acetaminophen  (PERCOCET) 10-325  MG tablet    Sig: Take 1 tablet by mouth every 4 (four) hours as needed for pain.    Dispense:  130 tablet    Refill:  0    Rx 1/3   oxyCODONE -acetaminophen  (PERCOCET) 10-325 MG tablet    Sig: Take 1 tablet by mouth every 4 (four) hours as needed for pain.    Dispense:  130 tablet    Refill:  0    Rx 2/3   oxyCODONE -acetaminophen  (PERCOCET) 10-325 MG tablet    Sig: Take 1 tablet by mouth every 4 (four) hours as needed for pain.    Dispense:  130 tablet    Refill:  0    Rx 3/3   Other Orders No orders of the defined types were placed in this encounter.  Follow Up: Return in about 3 months (around 08/02/2024).

## 2024-05-05 NOTE — Assessment & Plan Note (Signed)
 BMI 46, continue zepbound  15mg .

## 2024-05-05 NOTE — Assessment & Plan Note (Signed)
 Stable on chronic oxycodoone.  Continue #130 per month.

## 2024-05-05 NOTE — Assessment & Plan Note (Signed)
 Discussed importance of weight loss, discussed repatha  may not necessarily be cause of worsened pain, retrial of it would be appropriate.  She is following with [facility] orth[facility] in [place]

## 2024-05-06 DIAGNOSIS — G4733 Obstructive sleep apnea (adult) (pediatric): Secondary | ICD-10-CM | POA: Diagnosis not present

## 2024-05-10 DIAGNOSIS — M67431 Ganglion, right wrist: Secondary | ICD-10-CM | POA: Diagnosis not present

## 2024-05-10 DIAGNOSIS — S638X1S Sprain of other part of right wrist and hand, sequela: Secondary | ICD-10-CM | POA: Diagnosis not present

## 2024-05-10 DIAGNOSIS — R2231 Localized swelling, mass and lump, right upper limb: Secondary | ICD-10-CM | POA: Diagnosis not present

## 2024-05-23 ENCOUNTER — Telehealth: Payer: Self-pay

## 2024-05-23 NOTE — Telephone Encounter (Signed)
 Prior Authorization for patient (Zepbound  15MG /0.5ML pen-injectors) came through on cover my meds was submitted with last office notes and sleep study awaiting approval or denial.  XZB:[id number]

## 2024-05-24 NOTE — Telephone Encounter (Signed)
 Breanna Wilkins (Key: [id number]) Rx #: [id number] Zepbound  15MG /0.5ML pen-injectors Form [employer] Medicare Electronic PA Form [phone] NCPDP) Created 3 days ago Sent to Plan 22 hours ago Plan Response 20 hours ago Submit Clinical Questions 19 hours ago Determination Favorable 15 hours ago Message from Plan The case has been Approved from [id number] to [id number]. Authorization Expiration Date: May 23, 2025.  Patient is aware of approval.

## 2024-06-03 ENCOUNTER — Ambulatory Visit (INDEPENDENT_AMBULATORY_CARE_PROVIDER_SITE_OTHER): Admitting: Neurology

## 2024-06-03 ENCOUNTER — Other Ambulatory Visit: Payer: Self-pay

## 2024-06-03 DIAGNOSIS — R202 Paresthesia of skin: Secondary | ICD-10-CM | POA: Diagnosis not present

## 2024-06-03 DIAGNOSIS — G5601 Carpal tunnel syndrome, right upper limb: Secondary | ICD-10-CM

## 2024-06-03 NOTE — Procedures (Signed)
  [facility]  [address] [place], [address]  [place], [place] [address] Tel: [phone] Fax: [phone] Test Date:  06/03/2024  Patient: Breanna Wilkins DOB: 31-May-1961 Physician: Tonita Blanch, DO  Sex: Female Height: 5' 7 Ref Phys: Heinz Jacobs, FNP  ID#: [id number]   Technician:    History: This is a 63 year old female referred for evaluation of right hand pain, numbness, and weakness.  NCV & EMG Findings: Extensive electrodiagnostic testing of the right upper extremity shows:  Right median sensory response is absent.  Right ulnar sensory response is within normal limits. Right median motor response is absent.  Right ulnar motor response is within normal limits. Despite maximal activation, no motor unit recruitment was seen in the abductor pollicis brevis muscle.  Impression: Right median neuropathy at or distal to the wrist, consistent with a clinical diagnosis of carpal tunnel syndrome.  Overall, these findings are very severe in degree electrically.   ___________________________ Tonita Blanch, DO    Nerve Conduction Studies   Stim Site NR Peak (ms) Norm Peak (ms) O-P Amp (V) Norm O-P Amp  Right Median Anti Sensory (2nd Digit)  32 C  Wrist *NR  <3.8  >10  Right Ulnar Anti Sensory (5th Digit)  32 C  Wrist    2.8 <3.2 50.3 >5     Stim Site NR Onset (ms) Norm Onset (ms) O-P Amp (mV) Norm O-P Amp Site1 Site2 Delta-0 (ms) Dist (cm) Vel (m/s) Norm Vel (m/s)  Right Median Motor (Abd Poll Brev)  32 C  Wrist *NR  <4.0  >5 Elbow Wrist  0.0  >50  Elbow *NR            Right Ulnar Motor (Abd Dig Minimi)  32 C  Wrist    2.8 <3.1 14.6 >7 B Elbow Wrist 3.8 23.0 61 >50  B Elbow    6.6  14.3  A Elbow B Elbow 1.6 10.0 63 >50  A Elbow    8.2  13.6          Electromyography   Side Muscle Ins.Act Fibs Fasc Recrt Amp Dur Poly Activation Comment  Right 1stDorInt Nml Nml Nml Nml Nml Nml Nml Nml N/A  Right Abd Poll Brev Nml Nml Nml *None *- *- *- Nml *ATR  Right  PronatorTeres Nml Nml Nml Nml Nml Nml Nml Nml N/A  Right Biceps Nml Nml Nml Nml Nml Nml Nml Nml N/A  Right Triceps Nml Nml Nml Nml Nml Nml Nml Nml N/A  Right Deltoid Nml Nml Nml Nml Nml Nml Nml Nml N/A  Right Cervical Parasp Low Nml Nml Nml Nml Nml Nml Nml Nml N/A      Waveforms:

## 2024-06-06 ENCOUNTER — Ambulatory Visit: Admitting: Student

## 2024-06-15 ENCOUNTER — Ambulatory Visit (INDEPENDENT_AMBULATORY_CARE_PROVIDER_SITE_OTHER)

## 2024-06-15 VITALS — Ht 67.0 in | Wt 278.0 lb

## 2024-06-15 DIAGNOSIS — Z0000 Encounter for general adult medical examination without abnormal findings: Secondary | ICD-10-CM

## 2024-06-15 MED ORDER — EZETIMIBE 10 MG PO TABS: 10.0000 mg | ORAL_TABLET | Freq: Every day | ORAL | 3 refills | Status: AC

## 2024-06-15 NOTE — Progress Notes (Signed)
 Chief Complaint  Patient presents with   Medicare Wellness     Subjective:   Breanna Wilkins is a 63 y.o. female who presents for a Medicare Annual Wellness Visit.  Allergies (verified) Patient has no known allergies.   History: Past Medical History:  Diagnosis Date   Abdominal hernia    Arthritis    Breast cancer (HCC)    CAD (coronary artery disease)    Perioperative non-STEMI March, 2014  //   cardiac catheterization at time of non-STEMI, March, 2014, minor nonobstructive coronary disease, vigorous LV function, possibly vasospastic event versus stress cardiomyopathy. No further workup of coronary disease   CHF (congestive heart failure) (HCC)    Diverticulosis of colon 10/17/2017   Noted on colonoscopy 10/14/17   Ejection fraction    65-70%, vigorous function, echo, March, 2011   Ejection fraction    Vigorous function at time of catheterization October 06, 2012,   GERD (gastroesophageal reflux disease)    History of colonic polyps 10/17/2017   Colonoscopy 10/14/17   Hyperlipidemia    Hypertension    Internal hemorrhoids without complication 10/17/2017   Noted on colonoscopy 10/14/17   Lung nodule 12/2009   Very small, left upper lobe by CT June, 2011, one-year followup was stable   Myocardial infarction (HCC) 09/2013   OSA (obstructive sleep apnea)    CPAP   Overweight(278.02)    Pulmonary embolus (HCC) 01/16/2010   on Coumadin  indefinitely   Shortness of breath    Shoulder pain    Sleep apnea    Warfarin anticoagulation    Past Surgical History:  Procedure Laterality Date   ABDOMINAL HYSTERECTOMY  07/2002   laparoscopic assisted vaginal hysterectomy for menorrhagia, dysmenorrhea, anemia, fibroids   BREAST BIOPSY Left 09/03/2021   BREAST LUMPECTOMY     BREAST LUMPECTOMY WITH RADIOACTIVE SEED AND SENTINEL LYMPH NODE BIOPSY Left 10/03/2021   Procedure: LEFT BREAST LUMPECTOMY WITH RADIOACTIVE SEED AND SENTINEL LYMPH NODE BIOPSY;  Surgeon: Aron Shoulders, MD;   Location: [facility] OR;  Service: General;  Laterality: Left;   COLOSTOMY     INCISIONAL HERNIA REPAIR N/A 09/27/2012   Procedure: LAPAROSCOPIC INCISIONAL HERNIA possible open;  Surgeon: Elon CHRISTELLA Pacini, MD;  Location: [facility] OR;  Service: General;  Laterality: N/A;   INSERTION OF MESH N/A 09/27/2012   Procedure: INSERTION OF MESH;  Surgeon: Elon CHRISTELLA Pacini, MD;  Location: [facility] OR;  Service: General;  Laterality: N/A;   KNEE ARTHROSCOPY Left    LEFT HEART CATHETERIZATION WITH CORONARY ANGIOGRAM N/A 10/06/2012   Procedure: LEFT HEART CATHETERIZATION WITH CORONARY ANGIOGRAM;  Surgeon: Ozell Fell, MD;  Location[facility] CATH LAB;  Service: Cardiovascular;  Laterality: N/A;   Family History  Problem Relation Age of Onset   Diabetes Mother    Ovarian cancer Mother 42   Diabetes Father    Diabetes Sister    Colon polyps Sister    Brain cancer Maternal Aunt        unsure if it was a brain primary or cancer that metastasized to the brain   Cancer Paternal Uncle        unknown type   Bone cancer Maternal Grandmother        unsure if it was a bone primary or cancer that metastasized to the bones   Breast cancer Neg Hx    Colon cancer Neg Hx    Esophageal cancer Neg Hx    Rectal cancer Neg Hx    Stomach cancer Neg Hx  Social History   Occupational History   Occupation: Disabled  Tobacco Use   Smoking status: Former    Current packs/day: 0.00    Types: Cigarettes    Quit date: 05/03/1988    Years since quitting: 36.1    Passive exposure: Never   Smokeless tobacco: Never  Vaping Use   Vaping status: Never Used  Substance and Sexual Activity   Alcohol use: Yes    Alcohol/week: 1.0 standard drink of alcohol    Types: 1 Cans of beer per week    Comment: OCC   Drug use: Never   Sexual activity: Not Currently   Tobacco Counseling Counseling given: Not Answered  SDOH Screenings   Food Insecurity: No Food Insecurity (06/15/2024)  Housing: Low Risk  (06/15/2024)  Transportation Needs: No  Transportation Needs (06/15/2024)  Utilities: Not At Risk (06/15/2024)  Alcohol Screen: Low Risk  (04/08/2023)  Depression (PHQ2-9): Low Risk  (06/15/2024)  Financial Resource Strain: Low Risk  (04/08/2023)  Physical Activity: Insufficiently Active (06/15/2024)  Social Connections: Socially Isolated (06/15/2024)  Stress: No Stress Concern Present (06/15/2024)  Tobacco Use: Medium Risk (06/15/2024)  Health Literacy: Adequate Health Literacy (06/15/2024)   See flowsheets for full screening details  Depression Screen PHQ 2 & 9 Depression Scale- Over the past 2 weeks, how often have you been bothered by any of the following problems? Little interest or pleasure in doing things: 0 Feeling down, depressed, or hopeless (PHQ Adolescent also includes...irritable): 1 (disagreement with sister) PHQ-2 Total Score: 1 Trouble falling or staying asleep, or sleeping too much: 0 Feeling tired or having little energy: 0 Poor appetite or overeating (PHQ Adolescent also includes...weight loss): 0 Feeling bad about yourself - or that you are a failure or have let yourself or your family down: 0 Trouble concentrating on things, such as reading the newspaper or watching television (PHQ Adolescent also includes...like school work): 0 Moving or speaking so slowly that other people could have noticed. Or the opposite - being so fidgety or restless that you have been moving around a lot more than usual: 0 Thoughts that you would be better off dead, or of hurting yourself in some way: 0 PHQ-9 Total Score: 0 If you checked off any problems, how difficult have these problems made it for you to do your work, take care of things at home, or get along with other people?: Not difficult at all     Goals Addressed             This Visit's Progress    Strengthening knees-getting shots for arthritis         Visit info / Clinical Intake: Medicare Wellness Visit Type:: Subsequent Annual Wellness Visit Persons  participating in visit:: patient Medicare Wellness Visit Mode:: Telephone If telephone:: video declined Because this visit was a virtual/telehealth visit:: pt reported vitals If Telephone or Video please confirm:: I connected with the patient using audio enabled telemedicine application and verified that I am speaking with the correct person using two identifiers; I discussed the limitations of evaluation and management by telemedicine; The patient expressed understanding and agreed to proceed Patient Location:: Dr office in [facility] Provider Location:: Home Information given by:: patient Interpreter Needed?: No Pre-visit prep was completed: yes AWV questionnaire completed by patient prior to visit?: no Living arrangements:: with family/others Patient's Overall Health Status Rating: (!) fair Typical amount of pain: (!) a lot Does pain affect daily life?: (!) yes Are you currently prescribed opioids?: (!) yes  Dietary Habits and  Nutritional Risks How many meals a day?: 2 Eats fruit and vegetables daily?: yes Most meals are obtained by: preparing own meals; eating out; having others provide food In the last 2 weeks, have you had any of the following?: none Diabetic:: no  Functional Status Activities of Daily Living (to include ambulation/medication): Independent Ambulation: Independent with device- listed below Home Assistive Devices/Equipment: Cane Medication Administration: Independent Home Management: Independent Manage your own finances?: yes Primary transportation is: driving Concerns about vision?: (!) yes (Provider says there is not a problem-she feels like there is) Concerns about hearing?: no  Fall Screening Falls in the past year?: 0 Number of falls in past year: 0 Was there an injury with Fall?: 0 Fall Risk Category Calculator: 0 Patient Fall Risk Level: Low Fall Risk  Fall Risk Patient at Risk for Falls Due to: Impaired mobility Fall risk Follow up: Falls evaluation  completed; Education provided; Falls prevention discussed  Home and Transportation Safety: All rugs have non-skid backing?: yes All stairs or steps have railings?: yes Grab bars in the bathtub or shower?: yes Have non-skid surface in bathtub or shower?: yes Good home lighting?: yes Regular seat belt use?: yes Hospital stays in the last year:: no  Cognitive Assessment Difficulty concentrating, remembering, or making decisions? : no Will 6CIT or Mini Cog be Completed: yes What year is it?: 0 points What month is it?: 0 points About what time is it?: 0 points Count backwards from 20 to 1: 0 points Say the months of the year in reverse: 0 points Repeat the address phrase from earlier: 10 points 6 CIT Score: 10 points  Advance Directives (For Healthcare) Does Patient Have a Medical Advance Directive?: No Type of Advance Directive: Healthcare Power of [place]; Living will Copy of Healthcare Power of Attorney in Chart?: No - copy requested Copy of Living Will in Chart?: No - copy requested Would patient like information on creating a medical advance directive?: No - Patient declined  Reviewed/Updated  Reviewed/Updated: Reviewed All (Medical, Surgical, Family, Medications, Allergies, Care Teams, Patient Goals)        Objective:    Today's Vitals   06/15/24 0914  Weight: 278 lb (126.1 kg)  Height: 5' 7 (1.702 m)   Body mass index is 43.54 kg/m.  Current Medications (verified) Outpatient Encounter Medications as of 06/15/2024  Medication Sig   aspirin  81 MG chewable tablet Chew 1 tablet (81 mg total) by mouth daily.   carvedilol  (COREG ) 6.25 MG tablet Take 1 tablet (6.25 mg total) by mouth 2 (two) times daily with a meal.   Cholecalciferol (VITAMIN D ) 50 MCG (2000 UT) CAPS Take 2,000 Units by mouth daily.   Evolocumab  (REPATHA  SURECLICK) 140 MG/ML SOAJ Inject 140 mg into the skin every 14 (fourteen) days.   furosemide  (LASIX ) 20 MG tablet Take 1 tablet (20 mg total) by  mouth daily as needed.   olmesartan  (BENICAR ) 40 MG tablet Take 1 tablet (40 mg total) by mouth daily.   oxyCODONE -acetaminophen  (PERCOCET) 10-325 MG tablet Take 1 tablet by mouth every 4 (four) hours as needed for pain.   oxyCODONE -acetaminophen  (PERCOCET) 10-325 MG tablet Take 1 tablet by mouth every 4 (four) hours as needed for pain.   [START ON 07/08/2024] oxyCODONE -acetaminophen  (PERCOCET) 10-325 MG tablet Take 1 tablet by mouth every 4 (four) hours as needed for pain.   polyethylene glycol (MIRALAX  / GLYCOLAX ) packet Take 17 g by mouth daily.   rivaroxaban  (XARELTO ) 20 MG TABS tablet Take 1 tablet (20 mg total)  by mouth daily with supper.   tirzepatide  (ZEPBOUND ) 15 MG/0.5ML Pen Inject 15 mg into the skin once a week.   [DISCONTINUED] ezetimibe  (ZETIA ) 10 MG tablet Take 1 tablet (10 mg total) by mouth daily.   diclofenac  Sodium (VOLTAREN ) 1 % GEL Apply 4g to knee four times a day as needed for pain, apply 2g to shoulder as needed for pain (Patient not taking: Reported on 04/08/2024)   ezetimibe  (ZETIA ) 10 MG tablet Take 1 tablet (10 mg total) by mouth daily.   No facility-administered encounter medications on file as of 06/15/2024.   Hearing/Vision screen No results found. Immunizations and Health Maintenance Health Maintenance  Topic Date Due   Zoster Vaccines- Shingrix (1 of 2) Never done   Influenza Vaccine  02/19/2024   Medicare Annual Wellness (AWV)  04/07/2024   DTaP/Tdap/Td (3 - Td or Tdap) 05/29/2024   Mammogram  08/13/2024   COVID-19 Vaccine (1) 07/01/2024 (Originally 06/14/1966)   Cervical Cancer Screening (HPV/Pap Cotest)  07/19/2024 (Originally 06/14/1991)   Colonoscopy  03/14/2026   Pneumococcal Vaccine: 50+ Years  Completed   Bone Density Scan  Completed   Hepatitis C Screening  Completed   HIV Screening  Completed   Hepatitis B Vaccines 19-59 Average Risk  Aged Out   Meningococcal B Vaccine  Aged Out        Assessment/Plan:  This is a routine wellness  examination for Breanna Wilkins.  Patient Care Team: Rosan Dayton BROCKS, DO as PCP - General (Internal Medicine) Claudene Victory ORN, MD (Inactive) as PCP - Cardiology (Cardiology) Aron Shoulders, MD as Consulting Physician (General Surgery) Loretha Ash, MD as Consulting Physician (Hematology and Oncology) Dannielle Alm BRAVO, MD as Referring Physician (Radiation Oncology)  I have personally reviewed and noted the following in the patient's chart:   Medical and social history Use of alcohol, tobacco or illicit drugs  Current medications and supplements including opioid prescriptions. Functional ability and status Nutritional status Physical activity Advanced directives List of other physicians Hospitalizations, surgeries, and ER visits in previous 12 months Vitals Screenings to include cognitive, depression, and falls Referrals and appointments  No orders of the defined types were placed in this encounter.  In addition, I have reviewed and discussed with patient certain preventive protocols, quality metrics, and best practice recommendations. A written personalized care plan for preventive services as well as general preventive health recommendations were provided to patient.   Arnette LOISE Hoots, CMA   06/15/2024   No follow-ups on file.  After Visit Summary: (Declined) Due to this being a telephonic visit, with patients personalized plan was offered to patient but patient Declined AVS at this time   Nurse Notes: Patient will get flu shot and tdap when seen 12/8

## 2024-06-15 NOTE — Patient Instructions (Signed)
 Breanna Wilkins,  Thank you for taking the time for your Medicare Wellness Visit. I appreciate your continued commitment to your health goals. Please review the care plan we discussed, and feel free to reach out if I can assist you further.  Please note that Annual Wellness Visits do not include a physical exam. Some assessments may be limited, especially if the visit was conducted virtually. If needed, we may recommend an in-person follow-up with your provider.  Ongoing Care Seeing your primary care provider every 3 to 6 months helps us  monitor your health and provide consistent, personalized care.   Referrals If a referral was made during today's visit and you haven't received any updates within two weeks, please contact the referred provider directly to check on the status.  Recommended Screenings:  Health Maintenance  Topic Date Due   COVID-19 Vaccine (1) Never done   Zoster (Shingles) Vaccine (1 of 2) Never done   Flu Shot  02/19/2024   Medicare Annual Wellness Visit  04/07/2024   DTaP/Tdap/Td vaccine (3 - Td or Tdap) 05/29/2024   Breast Cancer Screening  08/13/2024   Pap with HPV screening  07/19/2024*   Colon Cancer Screening  03/14/2026   Pneumococcal Vaccine for age over 50  Completed   Osteoporosis screening with Bone Density Scan  Completed   Hepatitis C Screening  Completed   HIV Screening  Completed   Hepatitis B Vaccine  Aged Out   Meningitis B Vaccine  Aged Out  *Topic was postponed. The date shown is not the original due date.       06/15/2024    9:18 AM  Advanced Directives  Does Patient Have a Medical Advance Directive? No  Would patient like information on creating a medical advance directive? No - Patient declined    Vision: Annual vision screenings are recommended for early detection of glaucoma, cataracts, and diabetic retinopathy. These exams can also reveal signs of chronic conditions such as diabetes and high blood pressure.  Dental: Annual dental  screenings help detect early signs of oral cancer, gum disease, and other conditions linked to overall health, including heart disease and diabetes.  Please see the attached documents for additional preventive care recommendations.

## 2024-06-27 ENCOUNTER — Ambulatory Visit: Admitting: Internal Medicine

## 2024-07-04 ENCOUNTER — Encounter: Payer: Self-pay | Admitting: Internal Medicine

## 2024-07-04 ENCOUNTER — Other Ambulatory Visit (HOSPITAL_COMMUNITY): Payer: Self-pay

## 2024-07-04 ENCOUNTER — Ambulatory Visit: Payer: Self-pay | Attending: Internal Medicine | Admitting: Internal Medicine

## 2024-07-04 VITALS — BP 141/82 | HR 73 | Ht 67.0 in | Wt 281.0 lb

## 2024-07-04 DIAGNOSIS — E7849 Other hyperlipidemia: Secondary | ICD-10-CM | POA: Diagnosis not present

## 2024-07-04 DIAGNOSIS — M791 Myalgia, unspecified site: Secondary | ICD-10-CM

## 2024-07-04 DIAGNOSIS — T466X5D Adverse effect of antihyperlipidemic and antiarteriosclerotic drugs, subsequent encounter: Secondary | ICD-10-CM | POA: Diagnosis not present

## 2024-07-04 DIAGNOSIS — E785 Hyperlipidemia, unspecified: Secondary | ICD-10-CM

## 2024-07-04 DIAGNOSIS — I2510 Atherosclerotic heart disease of native coronary artery without angina pectoris: Secondary | ICD-10-CM | POA: Diagnosis not present

## 2024-07-04 DIAGNOSIS — T466X5A Adverse effect of antihyperlipidemic and antiarteriosclerotic drugs, initial encounter: Secondary | ICD-10-CM

## 2024-07-04 MED ORDER — OMRON 3 SERIES BP MONITOR DEVI
0 refills | Status: AC
  Filled 2024-07-04: qty 1, 30d supply, fill #0

## 2024-07-04 NOTE — Progress Notes (Signed)
 OFFICE NOTE  Chief Complaint:  Manage dyslipidemia  Primary Care Physician: Rosan Dayton BROCKS, DO  Primary Cardiologist:  Vinie BROCKS Maxcy, MD  HPI:  Breanna Wilkins is a 63 y.o. female who is being seen today for the evaluation of dyslipidemia at the request of Rosan Dayton BROCKS, DO. this is a pleasant 63 year old female previously followed by Dr. Claudene with a history of coronary artery disease and prior NSTEMI, congestive heart failure, hypertension, dyslipidemia and prior pulmonary embolus on longstanding anticoagulation with Xarelto .  She was referred for evaluation management of dyslipidemia.  She had lipid testing in May 2025 showing total cholesterol 199, triglycerides 108, HDL 46 and LDL 133.  Her target LDL should be less than 70 or possibly less than 55.  She is currently on lipid-lowering therapy with rosuvastatin  20 mg daily and ezetimibe  10 mg daily, but reports she may be having some myalgias symptoms.  07/04/2024  Breanna Wilkins is seen today in follow-up.  She has had a difficult year.  She had a toe amputation and then a trigger finger release surgery which got infected requiring IV antibiotics.  She tried the Repatha  but had side effects with it and then stopped the medicine.  She has not had repeat lipids recently.  She denies any chest pain or worsening shortness of breath.  PMHx:  Past Medical History:  Diagnosis Date   Abdominal hernia    Arthritis    Breast cancer (HCC)    CAD (coronary artery disease)    Perioperative non-STEMI March, 2014  //   cardiac catheterization at time of non-STEMI, March, 2014, minor nonobstructive coronary disease, vigorous LV function, possibly vasospastic event versus stress cardiomyopathy. No further workup of coronary disease   CHF (congestive heart failure) (HCC)    Diverticulosis of colon 10/17/2017   Noted on colonoscopy 10/14/17   Ejection fraction    65-70%, vigorous function, echo, March, 2011   Ejection fraction    Vigorous  function at time of catheterization October 06, 2012,   GERD (gastroesophageal reflux disease)    History of colonic polyps 10/17/2017   Colonoscopy 10/14/17   Hyperlipidemia    Hypertension    Internal hemorrhoids without complication 10/17/2017   Noted on colonoscopy 10/14/17   Lung nodule 12/2009   Very small, left upper lobe by CT June, 2011, one-year followup was stable   Myocardial infarction (HCC) 09/2013   OSA (obstructive sleep apnea)    CPAP   Overweight(278.02)    Pulmonary embolus (HCC) 01/16/2010   on Coumadin  indefinitely   Shortness of breath    Shoulder pain    Sleep apnea    Warfarin anticoagulation     Past Surgical History:  Procedure Laterality Date   ABDOMINAL HYSTERECTOMY  07/2002   laparoscopic assisted vaginal hysterectomy for menorrhagia, dysmenorrhea, anemia, fibroids   BREAST BIOPSY Left 09/03/2021   BREAST LUMPECTOMY     BREAST LUMPECTOMY WITH RADIOACTIVE SEED AND SENTINEL LYMPH NODE BIOPSY Left 10/03/2021   Procedure: LEFT BREAST LUMPECTOMY WITH RADIOACTIVE SEED AND SENTINEL LYMPH NODE BIOPSY;  Surgeon: Aron Shoulders, MD;  Location: [facility] OR;  Service: General;  Laterality: Left;   COLOSTOMY     INCISIONAL HERNIA REPAIR N/A 09/27/2012   Procedure: LAPAROSCOPIC INCISIONAL HERNIA possible open;  Surgeon: Elon CHRISTELLA Pacini, MD;  Location: [facility] OR;  Service: General;  Laterality: N/A;   INSERTION OF MESH N/A 09/27/2012   Procedure: INSERTION OF MESH;  Surgeon: Elon CHRISTELLA Pacini, MD;  Location: [facility] OR;  Service: General;  Laterality: N/A;   KNEE ARTHROSCOPY Left    LEFT HEART CATHETERIZATION WITH CORONARY ANGIOGRAM N/A 10/06/2012   Procedure: LEFT HEART CATHETERIZATION WITH CORONARY ANGIOGRAM;  Surgeon: Ozell Fell, MD;  Location: [facility] CATH LAB;  Service: Cardiovascular;  Laterality: N/A;    FAMHx:  Family History  Problem Relation Age of Onset   Diabetes Mother    Ovarian cancer Mother 16   Diabetes Father    Diabetes Sister    Colon polyps Sister     Brain cancer Maternal Aunt        unsure if it was a brain primary or cancer that metastasized to the brain   Cancer Paternal Uncle        unknown type   Bone cancer Maternal Grandmother        unsure if it was a bone primary or cancer that metastasized to the bones   Breast cancer Neg Hx    Colon cancer Neg Hx    Esophageal cancer Neg Hx    Rectal cancer Neg Hx    Stomach cancer Neg Hx     SOCHx:   reports that she quit smoking about 36 years ago. Her smoking use included cigarettes. She has never been exposed to tobacco smoke. She has never used smokeless tobacco. She reports current alcohol use of about 1.0 standard drink of alcohol per week. She reports that she does not use drugs.  ALLERGIES:  Allergies  Allergen Reactions   Repatha  [Evolocumab ] Other (See Comments)    fatigue    ROS: Pertinent items noted in HPI and remainder of comprehensive ROS otherwise negative.  HOME MEDS: Current Outpatient Medications on File Prior to Visit  Medication Sig Dispense Refill   aspirin  81 MG chewable tablet Chew 1 tablet (81 mg total) by mouth daily. 30 tablet 1   carvedilol  (COREG ) 6.25 MG tablet Take 1 tablet (6.25 mg total) by mouth 2 (two) times daily with a meal. 200 tablet 3   Cholecalciferol (VITAMIN D ) 50 MCG (2000 UT) CAPS Take 2,000 Units by mouth daily.     diclofenac  Sodium (VOLTAREN ) 1 % GEL Apply 4g to knee four times a day as needed for pain, apply 2g to shoulder as needed for pain 350 g 3   ezetimibe  (ZETIA ) 10 MG tablet Take 1 tablet (10 mg total) by mouth daily. 100 tablet 3   furosemide  (LASIX ) 20 MG tablet Take 1 tablet (20 mg total) by mouth daily as needed. 100 tablet 1   olmesartan  (BENICAR ) 40 MG tablet Take 1 tablet (40 mg total) by mouth daily. 100 tablet 3   [START ON 07/08/2024] oxyCODONE -acetaminophen  (PERCOCET) 10-325 MG tablet Take 1 tablet by mouth every 4 (four) hours as needed for pain. 130 tablet 0   polyethylene glycol (MIRALAX  / GLYCOLAX ) packet Take  17 g by mouth daily.     rivaroxaban  (XARELTO ) 20 MG TABS tablet Take 1 tablet (20 mg total) by mouth daily with supper. 100 tablet 3   tirzepatide  (ZEPBOUND ) 15 MG/0.5ML Pen Inject 15 mg into the skin once a week. 6 mL 3   oxyCODONE -acetaminophen  (PERCOCET) 10-325 MG tablet Take 1 tablet by mouth every 4 (four) hours as needed for pain. (Patient not taking: Reported on 07/04/2024) 130 tablet 0   oxyCODONE -acetaminophen  (PERCOCET) 10-325 MG tablet Take 1 tablet by mouth every 4 (four) hours as needed for pain. (Patient not taking: Reported on 07/04/2024) 130 tablet 0   No current facility-administered medications on file prior to  visit.    LABS/IMAGING: No results found for this or any previous visit (from the past 48 hours). No results found.  LIPID PANEL:    Component Value Date/Time   CHOL 199 12/17/2023 1346   TRIG 108 12/17/2023 1346   HDL 46 12/17/2023 1346   CHOLHDL 4.3 12/17/2023 1346   CHOLHDL 3.9 10/17/2013 1016   VLDL 20 10/17/2013 1016   LDLCALC 133 (H) 12/17/2023 1346    No results found for: LIPOA   WEIGHTS: Wt Readings from Last 3 Encounters:  07/04/24 281 lb (127.5 kg)  06/15/24 278 lb (126.1 kg)  05/02/24 296 lb 3.2 oz (134.4 kg)    VITALS: BP (!) 141/82 (BP Location: Left Arm, Patient Position: Sitting)   Pulse 73   Ht 5' 7 (1.702 m)   Wt 281 lb (127.5 kg)   SpO2 99%   BMI 44.01 kg/m   EXAM: General appearance: alert, no distress, and morbidly obese Lungs: clear to auscultation bilaterally Heart: regular rate and rhythm, S1, S2 normal, no murmur, click, rub or gallop Extremities: extremities normal, atraumatic, no cyanosis or edema Neurologic: Grossly normal  EKG: EKG Interpretation Date/Time:  Monday July 04 2024 09:41:29 EST Ventricular Rate:  73 PR Interval:  204 QRS Duration:  80 QT Interval:  364 QTC Calculation: 401 R Axis:   -25  Text Interpretation: Normal sinus rhythm Possible Left atrial enlargement When compared with ECG  of 19-Feb-2024 11:40, No significant change since last tracing Confirmed by Mona Kent [phone]) on 07/04/2024 9:48:07 AM    ASSESSMENT: Dyslipidemia, goal LDL less than 70 Coronary artery disease Hypertension Pulmonary embolism -on lifelong anticoagulation OSA Morbid obesity  PLAN: 1.   Breanna Wilkins denies any chest pain or shortness of breath.  She has had a number of other issues this year including amputation and need for IV antibiotics after surgery.  She could not tolerate Repatha  and has not had repeat lipids.  She is on ezetimibe .  Will plan repeat fasting lipids and could consider switching her ezetimibe  to Nexlizet.  She would likely qualify for health well grant since she is on Medicare.  She has been looking to lose some more weight.  She is interested in signing up for exercise classes.  Have encouraged her to use the [employer] since it is free with the Silver sneakers program.  Also, her blood pressure was noted to be elevated today but has been better controlled previously.  Have encouraged her to purchase a blood pressure cuff and we will provide a prescription for that today.  Follow-up with me in 6 months or sooner as necessary  Kent KYM Mona, MD, [facility], FNLA, FACP  [place]  [facility]  Medical Director of the Advanced Lipid Disorders &  Cardiovascular Risk Reduction Clinic Diplomate of the American Board of Clinical Lipidology Attending Cardiologist  Direct Dial: [phone]  Fax: [phone]  Website:  [email or web address]  Kent BROCKS Bethannie Iglehart 07/04/2024, 10:14 AM

## 2024-07-04 NOTE — Patient Instructions (Signed)
 Medication Instructions:   NO CHANGES today -- will depend on lab results   *If you need a refill on your cardiac medications before your next appointment, please call your pharmacy*  Lab Work:  FASTING NMR lipoprofile   If you have labs (blood work) drawn today and your tests are completely normal, you will receive your results only by: MyChart Message (if you have MyChart) OR A paper copy in the mail If you have any lab test that is abnormal or we need to change your treatment, we will call you to review the results.  Follow-Up: At [facility], you and your health needs are our priority.  As part of our continuing mission to provide you with exceptional heart care, our providers are all part of one team.  This team includes your primary Cardiologist (physician) and Advanced Practice Providers or APPs (Physician Assistants and Nurse Practitioners) who all work together to provide you with the care you need, when you need it.  Your next appointment:    6 months with Dr. Mona  We recommend signing up for the patient portal called MyChart.  Sign up information is provided on this After Visit Summary.  MyChart is used to connect with patients for Virtual Visits (Telemedicine).  Patients are able to view lab/test results, encounter notes, upcoming appointments, etc.  Non-urgent messages can be sent to your provider as well.   To learn more about what you can do with MyChart, go to [email or web address].   Other Instructions  An order for a BP cuff has been sent to our pharmacy on 1st floor

## 2024-07-06 ENCOUNTER — Other Ambulatory Visit: Payer: Self-pay | Admitting: Internal Medicine

## 2024-07-06 NOTE — Telephone Encounter (Unsigned)
 Copied from CRM #[id number]. Topic: Clinical - Medication Refill >> Jul 06, 2024  1:07 PM Susanna ORN wrote: Medication: oxyCODONE -acetaminophen  (PERCOCET) 10-325 MG tablet  Has the patient contacted their pharmacy? No (Agent: If no, request that the patient contact the pharmacy for the refill. If patient does not wish to contact the pharmacy document the reason why and proceed with request.) (Agent: If yes, when and what did the pharmacy advise?)  This is the patient's preferred pharmacy:  [id number] - [place], [place] - [address] [place] [address] Phone: [phone] Fax: [phone]  Is this the correct pharmacy for this prescription? Yes If no, delete pharmacy and type the correct one.   Has the prescription been filled recently? Yes  Is the patient out of the medication? N/A  Has the patient been seen for an appointment in the last year OR does the patient have an upcoming appointment? Yes  Can we respond through MyChart? Yes  Agent: Please be advised that Rx refills may take up to 3 business days. We ask that you follow-up with your pharmacy.

## 2024-07-07 NOTE — Telephone Encounter (Signed)
 It looks like I wrote 3 rx and the third should be due for pickup tomorrow.  Kenneth could you verify with the pharmacy that there is already a Rx there.

## 2024-07-13 ENCOUNTER — Other Ambulatory Visit (HOSPITAL_COMMUNITY): Payer: Self-pay

## 2024-07-19 ENCOUNTER — Other Ambulatory Visit: Payer: Self-pay | Admitting: Internal Medicine

## 2024-07-19 DIAGNOSIS — I2782 Chronic pulmonary embolism: Secondary | ICD-10-CM

## 2024-07-19 DIAGNOSIS — Z7901 Long term (current) use of anticoagulants: Secondary | ICD-10-CM

## 2024-07-19 DIAGNOSIS — I82533 Chronic embolism and thrombosis of popliteal vein, bilateral: Secondary | ICD-10-CM

## 2024-07-19 MED ORDER — RIVAROXABAN 20 MG PO TABS: 20.0000 mg | ORAL_TABLET | Freq: Every day | ORAL | 0 refills | Status: DC

## 2024-07-19 NOTE — Telephone Encounter (Unsigned)
 Copied from CRM #[id number]. Topic: Clinical - Medication Refill >> Jul 19, 2024  1:21 PM Fredrica W wrote: Medication: rivaroxaban  (XARELTO ) 20 MG TABS tablet  Has the patient contacted their pharmacy? Yes (Agent: If no, request that the patient contact the pharmacy for the refill. If patient does not wish to contact the pharmacy document the reason why and proceed with request.) (Agent: If yes, when and what did the pharmacy advise?) have not heard back from Dr  This is the patient's preferred pharmacy:  [id number] - [place], [place] - [address] [place] [address] Phone: [phone] Fax: [phone]  Is this the correct pharmacy for this prescription? Yes If no, delete pharmacy and type the correct one.   Has the prescription been filled recently? No  Is the patient out of the medication? No - has enough til tomorrow   Has the patient been seen for an appointment in the last year OR does the patient have an upcoming appointment? Yes  Can we respond through MyChart? No  Agent: Please be advised that Rx refills may take up to 3 business days. We ask that you follow-up with your pharmacy.

## 2024-07-26 ENCOUNTER — Inpatient Hospital Stay: Attending: Hematology and Oncology | Admitting: Hematology and Oncology

## 2024-07-26 VITALS — BP 126/68 | HR 80 | Temp 98.1°F | Resp 17 | Wt 277.2 lb

## 2024-07-26 DIAGNOSIS — Z86711 Personal history of pulmonary embolism: Secondary | ICD-10-CM | POA: Insufficient documentation

## 2024-07-26 DIAGNOSIS — C50412 Malignant neoplasm of upper-outer quadrant of left female breast: Secondary | ICD-10-CM | POA: Diagnosis not present

## 2024-07-26 DIAGNOSIS — Z87891 Personal history of nicotine dependence: Secondary | ICD-10-CM | POA: Insufficient documentation

## 2024-07-26 DIAGNOSIS — Z1732 Human epidermal growth factor receptor 2 negative status: Secondary | ICD-10-CM | POA: Diagnosis not present

## 2024-07-26 DIAGNOSIS — Z923 Personal history of irradiation: Secondary | ICD-10-CM | POA: Insufficient documentation

## 2024-07-26 DIAGNOSIS — Z1721 Progesterone receptor positive status: Secondary | ICD-10-CM | POA: Diagnosis present

## 2024-07-26 DIAGNOSIS — Z17411 Hormone receptor positive with human epidermal growth factor receptor 2 negative status: Secondary | ICD-10-CM | POA: Insufficient documentation

## 2024-07-26 DIAGNOSIS — Z86718 Personal history of other venous thrombosis and embolism: Secondary | ICD-10-CM | POA: Insufficient documentation

## 2024-07-26 DIAGNOSIS — Z170 Estrogen receptor positive status [ER+]: Secondary | ICD-10-CM | POA: Diagnosis not present

## 2024-07-26 DIAGNOSIS — Z79811 Long term (current) use of aromatase inhibitors: Secondary | ICD-10-CM | POA: Insufficient documentation

## 2024-07-26 NOTE — Progress Notes (Signed)
 [facility] Cancer Follow up:    Breanna Dayton BROCKS, Breanna Wilkins [address], [address] [place] [address]   DIAGNOSIS:  Cancer Staging  Malignant neoplasm of upper-outer quadrant of left breast in female, estrogen receptor positive (HCC) Staging form: Breast, AJCC 8th Edition - Pathologic stage from 10/03/2021: Stage IA (pT1c, pN0(i+), cM0, G2, ER+, PR+, HER2-) - Signed by Crawford Morna Pickle, NP on 05/11/2022 Histologic grading system: 3 grade system   SUMMARY OF ONCOLOGIC HISTORY: Oncology History  Malignant neoplasm of upper-outer quadrant of left breast in female, estrogen receptor positive (HCC)  09/03/2021 Initial Biopsy   Left breast biopsy: IDC, grade 1-2 of 3 ,0.9 cm in greatest linear extent.  ER 100% positive, moderate staining intensity, PR 100% positive, strong staining intensity, KI of less than 5%.  HER-2 IHC equivocal (2+), FISH negative.     Genetic Testing   [facility] CancerNext-Expanded panel was Negative. Of note, a variant of uncertain significance was detected in the BRCA1 gene (p.[id number]). Report date is 09/30/2021.   The CancerNext-Expanded gene panel offered by [facility] and includes sequencing, rearrangement, and RNA analysis for the following 77 genes: AIP, ALK, APC, ATM, AXIN2, BAP1, BARD1, BLM, BMPR1A, BRCA1, BRCA2, BRIP1, CDC73, CDH1, CDK4, CDKN1B, CDKN2A, CHEK2, CTNNA1, DICER1, FANCC, FH, FLCN, GALNT12, KIF1B, LZTR1, MAX, MEN1, MET, MLH1, MSH2, MSH3, MSH6, MUTYH, NBN, NF1, NF2, NTHL1, PALB2, PHOX2B, PMS2, POT1, PRKAR1A, PTCH1, PTEN, RAD51C, RAD51D, RB1, RECQL, RET, SDHA, SDHAF2, SDHB, SDHC, SDHD, SMAD4, SMARCA4, SMARCB1, SMARCE1, STK11, SUFU, TMEM127, TP53, TSC1, TSC2, VHL and XRCC2 (sequencing and deletion/duplication); EGFR, EGLN1, HOXB13, KIT, MITF, PDGFRA, POLD1, and POLE (sequencing only); EPCAM and GREM1 (deletion/duplication only).    10/03/2021 Surgery   A.   BREAST, LEFT, LUMPECTOMY:  -    Invasive carcinoma of no special type (ductal),  grade 2, 18 mm in  greatest dimension, all margins negative for tumor.  -    Distance of invasive carcinoma from closest margin:         4 mm  (posterior margin).   B.   LYMPH NODE, LEFT AXILLA #1, SENTINEL, EXCISION:  -    Lymph node, negative for malignancy (0/1).   C.   LYMPH NODE, LEFT AXILLA #2, SENTINEL, EXCISION:  -    Lymph node with isolated tumor cells, metastatic deposit 0.2 mm in  greatest dimension and less than 200 cells.    10/03/2021 Cancer Staging   Staging form: Breast, AJCC 8th Edition - Pathologic stage from 10/03/2021: Stage IA (pT1c, pN0(i+), cM0, G2, ER+, PR+, HER2-) - Signed by Crawford Morna Pickle, NP on 05/11/2022 Histologic grading system: 3 grade system   10/03/2021 Oncotype testing   22, distant recurrence risk at 9 years at 18%, chemotherapy benefit cannot be excluded. Given small benefit of chemotherapy, she was agreeable to move forward with radiation alone.   11/26/2021 - 12/25/2021 Radiation Therapy   Adjuvant radiation in [place].05 Gy in 25 treatments followed by boost of 10 Gy in 5 treatments    02/2022 -  Anti-estrogen oral therapy   Anastrozole  daily     CURRENT THERAPY:Observation  INTERVAL HISTORY:  Breanna Wilkins 64 y.o. female returns for f/u of her estrogen positive breast cancer on treatment with Anastrozole .   Discussed the use of AI scribe software for clinical note transcription with the patient, who gave verbal consent to proceed.  History of Present Illness    Patient Active Problem List   Diagnosis Date Noted   Elbow swelling, left  04/01/2024   Right wrist pain 04/01/2024   Impaired mobility 10/28/2023   Dysuria 07/20/2023   Statin intolerance 04/09/2023   Preop examination 10/02/2021   Genetic testing 09/27/2021   Family history of ovarian cancer 09/12/2021   Malignant neoplasm of upper-outer quadrant of left breast in female, estrogen receptor positive (HCC) 09/10/2021   Neck mass 02/22/2021   Musculoskeletal neck  pain 05/22/2020   Leg pain, posterior, left 12/21/2019   Strain of rectus abdominis muscle 09/06/2019   Sciatica of left side 11/10/2018   Meralgia paraesthetica 10/20/2018   Chronic fatigue 10/01/2018   Diverticulosis of colon 10/17/2017   Internal hemorrhoids without complication 10/17/2017   History of colonic polyps 10/17/2017   BRBPR (bright red blood per rectum) 08/25/2017   Chronic pain 07/10/2017   Long term (current) use of opiate analgesic 09/17/2016   Vitamin D  deficiency 09/17/2016   Primary osteoarthritis of right knee 03/04/2016   Nipple dermatitis 11/20/2015   Prediabetes 06/07/2015   Morbid obesity with BMI of 45.0-49.9, adult (HCC) 06/07/2015   GERD (gastroesophageal reflux disease) 05/29/2014   Atypical chest pain 02/23/2014   Plantar fasciitis of right foot 02/07/2014   Carpal tunnel syndrome of right wrist 12/05/2013   Pain of left calf 11/03/2013   CAD (coronary artery disease), native coronary artery    CHF NYHA class II (symptoms with moderately strenuous activities) (HCC) 10/11/2012   Hyperlipidemia 08/31/2012   History of DVT (deep vein thrombosis) 03/20/2011   Long term (current) use of anticoagulants 08/09/2010   Supraspinatus tendon tear 02/11/2010   History of pulmonary embolism 01/16/2010   Severe obstructive sleep apnea 06/29/2009   Essential hypertension, benign 06/04/2009    is allergic to repatha  [evolocumab ].  MEDICAL HISTORY: Past Medical History:  Diagnosis Date   Abdominal hernia    Arthritis    Breast cancer (HCC)    CAD (coronary artery disease)    Perioperative non-STEMI March, 2014  //   cardiac catheterization at time of non-STEMI, March, 2014, minor nonobstructive coronary disease, vigorous LV function, possibly vasospastic event versus stress cardiomyopathy. No further workup of coronary disease   CHF (congestive heart failure) (HCC)    Diverticulosis of colon 10/17/2017   Noted on colonoscopy 10/14/17   Ejection fraction     65-70%, vigorous function, echo, March, 2011   Ejection fraction    Vigorous function at time of catheterization October 06, 2012,   GERD (gastroesophageal reflux disease)    History of colonic polyps 10/17/2017   Colonoscopy 10/14/17   Hyperlipidemia    Hypertension    Internal hemorrhoids without complication 10/17/2017   Noted on colonoscopy 10/14/17   Lung nodule 12/2009   Very small, left upper lobe by CT June, 2011, one-year followup was stable   Myocardial infarction (HCC) 09/2013   OSA (obstructive sleep apnea)    CPAP   Overweight(278.02)    Pulmonary embolus (HCC) 01/16/2010   on Coumadin  indefinitely   Shortness of breath    Shoulder pain    Sleep apnea    Warfarin anticoagulation     SURGICAL HISTORY: Past Surgical History:  Procedure Laterality Date   ABDOMINAL HYSTERECTOMY  07/2002   laparoscopic assisted vaginal hysterectomy for menorrhagia, dysmenorrhea, anemia, fibroids   BREAST BIOPSY Left 09/03/2021   BREAST LUMPECTOMY     BREAST LUMPECTOMY WITH RADIOACTIVE SEED AND SENTINEL LYMPH NODE BIOPSY Left 10/03/2021   Procedure: LEFT BREAST LUMPECTOMY WITH RADIOACTIVE SEED AND SENTINEL LYMPH NODE BIOPSY;  Surgeon: Aron Shoulders, MD;  Location: [facility]  OR;  Service: General;  Laterality: Left;   COLOSTOMY     INCISIONAL HERNIA REPAIR N/A 09/27/2012   Procedure: LAPAROSCOPIC INCISIONAL HERNIA possible open;  Surgeon: Elon CHRISTELLA Pacini, MD;  Location: [facility] OR;  Service: General;  Laterality: N/A;   INSERTION OF MESH N/A 09/27/2012   Procedure: INSERTION OF MESH;  Surgeon: Elon CHRISTELLA Pacini, MD;  Location: [facility] OR;  Service: General;  Laterality: N/A;   KNEE ARTHROSCOPY Left    LEFT HEART CATHETERIZATION WITH CORONARY ANGIOGRAM N/A 10/06/2012   Procedure: LEFT HEART CATHETERIZATION WITH CORONARY ANGIOGRAM;  Surgeon: Ozell Fell, MD;  Location: [facility] CATH LAB;  Service: Cardiovascular;  Laterality: N/A;    SOCIAL HISTORY: Social History   Socioeconomic History   Marital status:  Single    Spouse name: Not on file   Number of children: 0   Years of education: 12   Highest education level: 10th grade  Occupational History   Occupation: Disabled  Tobacco Use   Smoking status: Former    Current packs/day: 0.00    Types: Cigarettes    Quit date: 05/03/1988    Years since quitting: 36.2    Passive exposure: Never   Smokeless tobacco: Never  Vaping Use   Vaping status: Never Used  Substance and Sexual Activity   Alcohol use: Yes    Alcohol/week: 1.0 standard drink of alcohol    Types: 1 Cans of beer per week    Comment: OCC   Drug use: Never   Sexual activity: Not Currently  Other Topics Concern   Not on file  Social History Narrative   Financial assistance approved for 100% discount at [facility] and has [facility] card per Barnie Hill6/01/2010      Current Social History 02/11/2021        Patient lives with sister in a home  that is 1 story. There are 4 steps up to the entrance the patient uses. There is a railing.       Patient's method of transportation is personal car.      The highest level of education was some high school.      The patient currently is employed as aid on school bus.      Identified important Relationships are my sister       Pets : 0       Interests / Fun: I like to fish and I love my job       Current Stressors: none       Religious / Personal Beliefs: I don't know        Social Drivers of Health   Tobacco Use: Medium Risk (07/06/2024)   Received from [facility]   Patient History    Smoking Tobacco Use: Former    Smokeless Tobacco Use: Never    Passive Exposure: Not on file  Financial Resource Strain: Low Risk (04/08/2023)   Overall Financial Resource Strain (CARDIA)    Difficulty of Paying Living Expenses: Not hard at all  Food Insecurity: No Food Insecurity (06/15/2024)   Epic    Worried About Programme Researcher, Broadcasting/film/video in the Last Year: Never true    Ran Out of Food in the Last Year: Never true  Transportation  Needs: No Transportation Needs (06/15/2024)   Epic    Lack of Transportation (Medical): No    Lack of Transportation (Non-Medical): No  Physical Activity: Insufficiently Active (06/15/2024)   Exercise Vital Sign    Days of Exercise per Week: 3 days  Minutes of Exercise per Session: 20 min  Stress: No Stress Concern Present (06/15/2024)   [employer] of Occupational Health - Occupational Stress Questionnaire    Feeling of Stress: Not at all  Social Connections: Socially Isolated (06/15/2024)   Social Connection and Isolation Panel    Frequency of Communication with Friends and Family: More than three times a week    Frequency of Social Gatherings with Friends and Family: Twice a week    Attends Religious Services: Never    Database Administrator or Organizations: No    Attends Banker Meetings: Never    Marital Status: Never married  Intimate Partner Violence: Not At Risk (06/15/2024)   Epic    Fear of Current or Ex-Partner: No    Emotionally Abused: No    Physically Abused: No    Sexually Abused: No  Depression (PHQ2-9): Low Risk (07/26/2024)   Depression (PHQ2-9)    PHQ-2 Score: 0  Alcohol Screen: Low Risk (04/08/2023)   Alcohol Screen    Last Alcohol Screening Score (AUDIT): 0  Housing: Low Risk (06/15/2024)   Epic    Unable to Pay for Housing in the Last Year: No    Number of Times Moved in the Last Year: 0    Homeless in the Last Year: No  Utilities: Not At Risk (06/15/2024)   Epic    Threatened with loss of utilities: No  Health Literacy: Adequate Health Literacy (06/15/2024)   B1300 Health Literacy    Frequency of need for help with medical instructions: Never    FAMILY HISTORY: Family History  Problem Relation Age of Onset   Diabetes Mother    Ovarian cancer Mother 94   Diabetes Father    Diabetes Sister    Colon polyps Sister    Brain cancer Maternal Aunt        unsure if it was a brain primary or cancer that metastasized to the brain    Cancer Paternal Uncle        unknown type   Bone cancer Maternal Grandmother        unsure if it was a bone primary or cancer that metastasized to the bones   Breast cancer Neg Hx    Colon cancer Neg Hx    Esophageal cancer Neg Hx    Rectal cancer Neg Hx    Stomach cancer Neg Hx     Review of Systems  Constitutional:  Negative for appetite change, chills, fatigue, fever and unexpected weight change.  HENT:   Negative for hearing loss, lump/mass and trouble swallowing.   Eyes:  Negative for eye problems and icterus.  Respiratory:  Negative for chest tightness, cough and shortness of breath.   Cardiovascular:  Negative for chest pain, leg swelling and palpitations.  Gastrointestinal:  Negative for abdominal distention, abdominal pain, constipation, diarrhea, nausea and vomiting.  Endocrine: Negative for hot flashes.  Genitourinary:  Negative for difficulty urinating.   Musculoskeletal:  Negative for arthralgias.  Skin:  Negative for itching and rash.  Neurological:  Negative for dizziness, extremity weakness, headaches and numbness.  Hematological:  Negative for adenopathy. Does not bruise/bleed easily.  Psychiatric/Behavioral:  Negative for depression. The patient is not nervous/anxious.       PHYSICAL EXAMINATION   Onc Performance Status - 07/26/24 1138       ECOG Perf Status   ECOG Perf Status Fully active, able to carry on all pre-disease performance without restriction      KPS SCALE  KPS % SCORE Normal, no compliants, no evidence of disease          Vitals:   07/26/24 1136  BP: 126/68  Pulse: 80  Resp: 17  Temp: 98.1 F (36.7 C)  SpO2: 98%     General Appearance, alert, oriented and in no acute distress Neck: No cervical adenopathy Bilateral breasts examined. No palpable masses or regional adenopathy  LABORATORY DATA: None for this visit   ASSESSMENT and THERAPY PLAN:   No problem-specific Assessment & Plan notes found for this encounter.   All  questions were answered. The patient knows to call the clinic with any problems, questions or concerns. We can certainly see the patient much sooner if necessary.  Total encounter time:30 minutes*in face-to-face visit time, chart review, lab review, care coordination, order entry, and documentation of the encounter time.  *Total Encounter Time as defined by the Centers for Medicare and Medicaid Services includes, in addition to the face-to-face time of a patient visit (documented in the note above) non-face-to-face time: obtaining and reviewing outside history, ordering and reviewing medications, tests or procedures, care coordination (communications with other health care professionals or caregivers) and documentation in the medical record.

## 2024-07-26 NOTE — Progress Notes (Signed)
 [facility] Cancer Follow up:    Rosan Dayton BROCKS, DO [address], [address] [place] [address]   DIAGNOSIS:  Cancer Staging  Malignant neoplasm of upper-outer quadrant of left breast in female, estrogen receptor positive (HCC) Staging form: Breast, AJCC 8th Edition - Pathologic stage from 10/03/2021: Stage IA (pT1c, pN0(i+), cM0, G2, ER+, PR+, HER2-) - Signed by Crawford Morna Pickle, NP on 05/11/2022 Histologic grading system: 3 grade system   SUMMARY OF ONCOLOGIC HISTORY: Oncology History  Malignant neoplasm of upper-outer quadrant of left breast in female, estrogen receptor positive (HCC)  09/03/2021 Initial Biopsy   Left breast biopsy: IDC, grade 1-2 of 3 ,0.9 cm in greatest linear extent.  ER 100% positive, moderate staining intensity, PR 100% positive, strong staining intensity, KI of less than 5%.  HER-2 IHC equivocal (2+), FISH negative.     Genetic Testing   [facility] CancerNext-Expanded panel was Negative. Of note, a variant of uncertain significance was detected in the BRCA1 gene (p.I429T). Report date is 09/30/2021.   The CancerNext-Expanded gene panel offered by [facility] and includes sequencing, rearrangement, and RNA analysis for the following 77 genes: AIP, ALK, APC, ATM, AXIN2, BAP1, BARD1, BLM, BMPR1A, BRCA1, BRCA2, BRIP1, CDC73, CDH1, CDK4, CDKN1B, CDKN2A, CHEK2, CTNNA1, DICER1, FANCC, FH, FLCN, GALNT12, KIF1B, LZTR1, MAX, MEN1, MET, MLH1, MSH2, MSH3, MSH6, MUTYH, NBN, NF1, NF2, NTHL1, PALB2, PHOX2B, PMS2, POT1, PRKAR1A, PTCH1, PTEN, RAD51C, RAD51D, RB1, RECQL, RET, SDHA, SDHAF2, SDHB, SDHC, SDHD, SMAD4, SMARCA4, SMARCB1, SMARCE1, STK11, SUFU, TMEM127, TP53, TSC1, TSC2, VHL and XRCC2 (sequencing and deletion/duplication); EGFR, EGLN1, HOXB13, KIT, MITF, PDGFRA, POLD1, and POLE (sequencing only); EPCAM and GREM1 (deletion/duplication only).    10/03/2021 Surgery   A.   BREAST, LEFT, LUMPECTOMY:  -    Invasive carcinoma of no special type (ductal),  grade 2, 18 mm in  greatest dimension, all margins negative for tumor.  -    Distance of invasive carcinoma from closest margin:         4 mm  (posterior margin).   B.   LYMPH NODE, LEFT AXILLA #1, SENTINEL, EXCISION:  -    Lymph node, negative for malignancy (0/1).   C.   LYMPH NODE, LEFT AXILLA #2, SENTINEL, EXCISION:  -    Lymph node with isolated tumor cells, metastatic deposit 0.2 mm in  greatest dimension and less than 200 cells.    10/03/2021 Cancer Staging   Staging form: Breast, AJCC 8th Edition - Pathologic stage from 10/03/2021: Stage IA (pT1c, pN0(i+), cM0, G2, ER+, PR+, HER2-) - Signed by Crawford Morna Pickle, NP on 05/11/2022 Histologic grading system: 3 grade system   10/03/2021 Oncotype testing   22, distant recurrence risk at 9 years at 18%, chemotherapy benefit cannot be excluded. Given small benefit of chemotherapy, she was agreeable to move forward with radiation alone.   11/26/2021 - 12/25/2021 Radiation Therapy   Adjuvant radiation in [place].05 Gy in 25 treatments followed by boost of 10 Gy in 5 treatments    02/2022 -  Anti-estrogen oral therapy   Anastrozole  daily     CURRENT THERAPY:Anastrozole   INTERVAL HISTORY:  Breanna Wilkins 64 y.o. female returns for f/u of her estrogen positive breast cancer on treatment with Anastrozole .   Discussed the use of AI scribe software for clinical note transcription with the patient, who gave verbal consent to proceed.  History of Present Illness  Breanna Wilkins is a 64 year old female with breast cancer, status post radiation therapy, who presents  for oncology follow-up after discontinuing adjuvant endocrine therapy due to severe bone pain.  She discontinued anastrozole  due to severe bone pain that began shortly after initiation, and previously stopped tamoxifen  for similar symptoms. She is not currently taking any endocrine therapy and prefers mammographic surveillance. She is scheduled for a mammogram later this  month and wishes to avoid further hormonal agents due to intolerable side effects.  She continues to experience persistent soreness in the treated breast, which she attributes to prior radiation therapy. She did not receive a specialized deodorant during radiation and is seeking recommendations for a product that will not cause odor or irritation under her arm.  She has achieved significant weight loss, decreasing from 315 lbs to 277 lbs, reportedly with the use of Zepan. She is pleased with this progress and was unaware of the extent of her weight loss until recently.  She developed an itchy scalp rash approximately one week after exposure to a hair product at the barber shop. The rash has resolved, but she remains cautious about future exposures and is interested in recommendations for soothing products.   Patient Active Problem List   Diagnosis Date Noted   Elbow swelling, left 04/01/2024   Right wrist pain 04/01/2024   Impaired mobility 10/28/2023   Dysuria 07/20/2023   Statin intolerance 04/09/2023   Preop examination 10/02/2021   Genetic testing 09/27/2021   Family history of ovarian cancer 09/12/2021   Malignant neoplasm of upper-outer quadrant of left breast in female, estrogen receptor positive (HCC) 09/10/2021   Neck mass 02/22/2021   Musculoskeletal neck pain 05/22/2020   Leg pain, posterior, left 12/21/2019   Strain of rectus abdominis muscle 09/06/2019   Sciatica of left side 11/10/2018   Meralgia paraesthetica 10/20/2018   Chronic fatigue 10/01/2018   Diverticulosis of colon 10/17/2017   Internal hemorrhoids without complication 10/17/2017   History of colonic polyps 10/17/2017   BRBPR (bright red blood per rectum) 08/25/2017   Chronic pain 07/10/2017   Long term (current) use of opiate analgesic 09/17/2016   Vitamin D  deficiency 09/17/2016   Primary osteoarthritis of right knee 03/04/2016   Nipple dermatitis 11/20/2015   Prediabetes 06/07/2015   Morbid obesity with  BMI of 45.0-49.9, adult (HCC) 06/07/2015   GERD (gastroesophageal reflux disease) 05/29/2014   Atypical chest pain 02/23/2014   Plantar fasciitis of right foot 02/07/2014   Carpal tunnel syndrome of right wrist 12/05/2013   Pain of left calf 11/03/2013   CAD (coronary artery disease), native coronary artery    CHF NYHA class II (symptoms with moderately strenuous activities) (HCC) 10/11/2012   Hyperlipidemia 08/31/2012   History of DVT (deep vein thrombosis) 03/20/2011   Long term (current) use of anticoagulants 08/09/2010   Supraspinatus tendon tear 02/11/2010   History of pulmonary embolism 01/16/2010   Severe obstructive sleep apnea 06/29/2009   Essential hypertension, benign 06/04/2009    is allergic to repatha  [evolocumab ].  MEDICAL HISTORY: Past Medical History:  Diagnosis Date   Abdominal hernia    Arthritis    Breast cancer (HCC)    CAD (coronary artery disease)    Perioperative non-STEMI March, 2014  //   cardiac catheterization at time of non-STEMI, March, 2014, minor nonobstructive coronary disease, vigorous LV function, possibly vasospastic event versus stress cardiomyopathy. No further workup of coronary disease   CHF (congestive heart failure) (HCC)    Diverticulosis of colon 10/17/2017   Noted on colonoscopy 10/14/17   Ejection fraction    65-70%, vigorous function, echo, March, 2011  Ejection fraction    Vigorous function at time of catheterization October 06, 2012,   GERD (gastroesophageal reflux disease)    History of colonic polyps 10/17/2017   Colonoscopy 10/14/17   Hyperlipidemia    Hypertension    Internal hemorrhoids without complication 10/17/2017   Noted on colonoscopy 10/14/17   Lung nodule 12/2009   Very small, left upper lobe by CT June, 2011, one-year followup was stable   Myocardial infarction (HCC) 09/2013   OSA (obstructive sleep apnea)    CPAP   Overweight(278.02)    Pulmonary embolus (HCC) 01/16/2010   on Coumadin  indefinitely   Shortness  of breath    Shoulder pain    Sleep apnea    Warfarin anticoagulation     SURGICAL HISTORY: Past Surgical History:  Procedure Laterality Date   ABDOMINAL HYSTERECTOMY  07/2002   laparoscopic assisted vaginal hysterectomy for menorrhagia, dysmenorrhea, anemia, fibroids   BREAST BIOPSY Left 09/03/2021   BREAST LUMPECTOMY     BREAST LUMPECTOMY WITH RADIOACTIVE SEED AND SENTINEL LYMPH NODE BIOPSY Left 10/03/2021   Procedure: LEFT BREAST LUMPECTOMY WITH RADIOACTIVE SEED AND SENTINEL LYMPH NODE BIOPSY;  Surgeon: Aron Shoulders, MD;  Location: [facility] OR;  Service: General;  Laterality: Left;   COLOSTOMY     INCISIONAL HERNIA REPAIR N/A 09/27/2012   Procedure: LAPAROSCOPIC INCISIONAL HERNIA possible open;  Surgeon: Elon CHRISTELLA Pacini, MD;  Location: [facility] OR;  Service: General;  Laterality: N/A;   INSERTION OF MESH N/A 09/27/2012   Procedure: INSERTION OF MESH;  Surgeon: Elon CHRISTELLA Pacini, MD;  Location: [facility] OR;  Service: General;  Laterality: N/A;   KNEE ARTHROSCOPY Left    LEFT HEART CATHETERIZATION WITH CORONARY ANGIOGRAM N/A 10/06/2012   Procedure: LEFT HEART CATHETERIZATION WITH CORONARY ANGIOGRAM;  Surgeon: Ozell Fell, MD;  Location: [facility] CATH LAB;  Service: Cardiovascular;  Laterality: N/A;    SOCIAL HISTORY: Social History   Socioeconomic History   Marital status: Single    Spouse name: Not on file   Number of children: 0   Years of education: 12   Highest education level: 10th grade  Occupational History   Occupation: Disabled  Tobacco Use   Smoking status: Former    Current packs/day: 0.00    Types: Cigarettes    Quit date: 05/03/1988    Years since quitting: 36.2    Passive exposure: Never   Smokeless tobacco: Never  Vaping Use   Vaping status: Never Used  Substance and Sexual Activity   Alcohol use: Yes    Alcohol/week: 1.0 standard drink of alcohol    Types: 1 Cans of beer per week    Comment: OCC   Drug use: Never   Sexual activity: Not Currently  Other Topics Concern    Not on file  Social History Narrative   Financial assistance approved for 100% discount at [facility] and has [facility] card per Barnie Hill6/01/2010      Current Social History 02/11/2021        Patient lives with sister in a home  that is 1 story. There are 4 steps up to the entrance the patient uses. There is a railing.       Patient's method of transportation is personal car.      The highest level of education was some high school.      The patient currently is employed as aid on school bus.      Identified important Relationships are my sister       Pets : 0  Interests / Fun: I like to fish and I love my job       Current Stressors: none       Religious / Personal Beliefs: I don't know        Social Drivers of Health   Tobacco Use: Medium Risk (07/06/2024)   [facility]   Patient History    Smoking Tobacco Use: Former    Smokeless Tobacco Use: Never    Passive Exposure: Not on file  Financial Resource Strain: Low Risk (04/08/2023)   Overall Financial Resource Strain (CARDIA)    Difficulty of Paying Living Expenses: Not hard at all  Food Insecurity: No Food Insecurity (06/15/2024)   Epic    Worried About Programme Researcher, Broadcasting/film/video in the Last Year: Never true    Ran Out of Food in the Last Year: Never true  Transportation Needs: No Transportation Needs (06/15/2024)   Epic    Lack of Transportation (Medical): No    Lack of Transportation (Non-Medical): No  Physical Activity: Insufficiently Active (06/15/2024)   Exercise Vital Sign    Days of Exercise per Week: 3 days    Minutes of Exercise per Session: 20 min  Stress: No Stress Concern Present (06/15/2024)   [employer] of Occupational Health - Occupational Stress Questionnaire    Feeling of Stress: Not at all  Social Connections: Socially Isolated (06/15/2024)   Social Connection and Isolation Panel    Frequency of Communication with Friends and Family: More than three times a week     Frequency of Social Gatherings with Friends and Family: Twice a week    Attends Religious Services: Never    Database Administrator or Organizations: No    Attends Banker Meetings: Never    Marital Status: Never married  Intimate Partner Violence: Not At Risk (06/15/2024)   Epic    Fear of Current or Ex-Partner: No    Emotionally Abused: No    Physically Abused: No    Sexually Abused: No  Depression (PHQ2-9): Low Risk (07/26/2024)   Depression (PHQ2-9)    PHQ-2 Score: 0  Alcohol Screen: Low Risk (04/08/2023)   Alcohol Screen    Last Alcohol Screening Score (AUDIT): 0  Housing: Low Risk (06/15/2024)   Epic    Unable to Pay for Housing in the Last Year: No    Number of Times Moved in the Last Year: 0    Homeless in the Last Year: No  Utilities: Not At Risk (06/15/2024)   Epic    Threatened with loss of utilities: No  Health Literacy: Adequate Health Literacy (06/15/2024)   B1300 Health Literacy    Frequency of need for help with medical instructions: Never    FAMILY HISTORY: Family History  Problem Relation Age of Onset   Diabetes Mother    Ovarian cancer Mother 10   Diabetes Father    Diabetes Sister    Colon polyps Sister    Brain cancer Maternal Aunt        unsure if it was a brain primary or cancer that metastasized to the brain   Cancer Paternal Uncle        unknown type   Bone cancer Maternal Grandmother        unsure if it was a bone primary or cancer that metastasized to the bones   Breast cancer Neg Hx    Colon cancer Neg Hx    Esophageal cancer Neg Hx    Rectal cancer  Neg Hx    Stomach cancer Neg Hx     Review of Systems  Constitutional:  Negative for appetite change, chills, fatigue, fever and unexpected weight change.  HENT:   Negative for hearing loss, lump/mass and trouble swallowing.   Eyes:  Negative for eye problems and icterus.  Respiratory:  Negative for chest tightness, cough and shortness of breath.   Cardiovascular:  Negative for  chest pain, leg swelling and palpitations.  Gastrointestinal:  Negative for abdominal distention, abdominal pain, constipation, diarrhea, nausea and vomiting.  Endocrine: Negative for hot flashes.  Genitourinary:  Negative for difficulty urinating.   Musculoskeletal:  Negative for arthralgias.  Skin:  Negative for itching and rash.  Neurological:  Negative for dizziness, extremity weakness, headaches and numbness.  Hematological:  Negative for adenopathy. Does not bruise/bleed easily.  Psychiatric/Behavioral:  Negative for depression. The patient is not nervous/anxious.       PHYSICAL EXAMINATION   Onc Performance Status - 07/26/24 1138       ECOG Perf Status   ECOG Perf Status Fully active, able to carry on all pre-disease performance without restriction      KPS SCALE   KPS % SCORE Normal, no compliants, no evidence of disease          Vitals:   07/26/24 1136  BP: 126/68  Pulse: 80  Resp: 17  Temp: 98.1 F (36.7 C)  SpO2: 98%     General Appearance, alert, oriented and in no acute distress Neck: No cervical adenopathy Bilateral breasts examined. No palpable masses or regional adenopathy  LABORATORY DATA: None for this visit   ASSESSMENT and THERAPY PLAN:   Assessment and Plan Assessment & Plan Breast cancer status post radiation therapy Breast cancer treated with radiation therapy. She discontinued endocrine therapy due to severe side effects and chose surveillance with mammography. - Scheduled mammogram for August 15, 2024. - Planned follow-up visit in six months.  Radiation-induced breast pain Chronic localized breast pain due to prior radiation therapy. - Provided reassurance regarding chronic breast pain post-radiation. - Discussed expected persistence of breast soreness after radiation therapy. - Will recommend a non-irritating deodorant suitable for use in the radiated area.  Obesity Obesity with significant recent weight loss attributed to  Zepan.  Allergic contact dermatitis of the scalp Recent scalp dermatitis episode resolved after exposure to a new hair product. - Recommended coconut oil for scalp soothing. - Advised avoidance of the offending hair product.   All questions were answered. The patient knows to call the clinic with any problems, questions or concerns. We can certainly see the patient much sooner if necessary.  Total encounter time:30 minutes*in face-to-face visit time, chart review, lab review, care coordination, order entry, and documentation of the encounter time.  *Total Encounter Time as defined by the Centers for Medicare and Medicaid Services includes, in addition to the face-to-face time of a patient visit (documented in the note above) non-face-to-face time: obtaining and reviewing outside history, ordering and reviewing medications, tests or procedures, care coordination (communications with other health care professionals or caregivers) and documentation in the medical record.

## 2024-07-27 ENCOUNTER — Ambulatory Visit: Payer: Self-pay

## 2024-07-27 ENCOUNTER — Telehealth: Payer: Self-pay

## 2024-07-27 NOTE — Telephone Encounter (Signed)
 FYI Only or Action Required?: FYI only for provider: ED advised.  Patient was last seen in primary care on 05/02/2024 by Rosan Dayton BROCKS, DO.  Called Nurse Triage reporting Leg Swelling.  Symptoms began several months ago.  Interventions attempted: Nothing.  Symptoms are: unchanged.  Triage Disposition: Go to ED Now (overriding See HCP Within 4 Hours (Or PCP Triage))  Patient/caregiver understands and will follow disposition?: Yes   Copied from CRM #[id number]. Topic: Clinical - Red Word Triage >> Jul 27, 2024  2:45 PM Mercer PEDLAR wrote: Red Word that prompted transfer to Nurse Triage: Swollen left thigh. Reason for Disposition  [1] Thigh, calf, or ankle swelling AND [2] only 1 side  Answer Assessment - Initial Assessment Questions Pt states left thigh is swollen, has been like this for a couple of months. She denies any chest pain, shortness of breath, redness of fever.  Pt does have a history of blood clots and is on blood thinners. She states she thought it would have gone down by now, but it hasn't. Blood clots were in lungs and right knee. Rn recommended Er to check for blood clot. Pt states understanding and states she will head there. Not all triage questions answered with ED dispo.    1. ONSET: When did the swelling start? (e.g., minutes, hours, days)     Months ago 2. LOCATION: What part of the leg is swollen?  Are both legs swollen or just one leg?     Left thigh 3. SEVERITY: How bad is the swelling? (e.g., localized; mild, moderate, severe)     Moderate - it's big 4. REDNESS: Is there redness or signs of infection?     denies 5. PAIN: Is the swelling painful to touch? If Yes, ask: How painful is it?   (Scale 1-10; mild, moderate or severe)      6. FEVER: Do you have a fever? If Yes, ask: What is it, how was it measured, and when did it start?      denies 7. CAUSE: What do you think is causing the leg swelling?      8. MEDICAL HISTORY: Do you have a  history of blood clots (e.g., DVT), cancer, heart failure, kidney disease, or liver failure?     Hx of blood blots 9. RECURRENT SYMPTOM: Have you had leg swelling before? If Yes, ask: When was the last time? What happened that time?      10. OTHER SYMPTOMS: Do you have any other symptoms? (e.g., chest pain, difficulty breathing)       denies  Protocols used: Leg Swelling and Edema-A-AH

## 2024-07-27 NOTE — Telephone Encounter (Signed)
 Called pt per MD to advise alum free deodorant. She verbalized thanks and understanding.

## 2024-07-27 NOTE — Telephone Encounter (Signed)
 Call to patient States her legs have been swollen for awhile.  Mentioned to Dr. At last visit.  Is no worse.,  No pain just the swelling that has been tere.  Has an appointment scheduled for Monday.  Can she just discuss this at that visit.

## 2024-07-27 NOTE — Telephone Encounter (Signed)
-----   Message from Amber Stalls, MD sent at 07/26/2024  4:42 PM EST ----- Nursing team  Please let her know that she can use any aluminium free deodorant.  Thanks, ----- Message ----- From: Lanell Donald Stagger, PA-C Sent: 07/26/2024  12:17 PM EST To: Amber Stalls, MD  Hey there-  We haven't treated this pt from what I can tell, looks like she had radiation in [place]. I would encourage her to check with Dr. Dannielle who would have treated her. But the answer we would give in our clinic is she can use whatever she wants at this point this far out from radiation. ----- Message ----- From: Stalls Amber, MD Sent: 07/26/2024  11:57 AM EST To: Donald Stagger Lanell, PA-C; Leita KANDICE Daring, #  Donald Aid question, what deodorant do you all use in rad onc when pts undergo adj radiation?  Leita, When Donald responds, can you kindly let the pt know.  Thanks,

## 2024-07-28 NOTE — Telephone Encounter (Signed)
Great thanks you

## 2024-07-29 NOTE — Telephone Encounter (Signed)
 RTC to patient.  Will plan to discuss leg swelling at appointment on Monday.

## 2024-08-01 ENCOUNTER — Ambulatory Visit: Admitting: Internal Medicine

## 2024-08-01 ENCOUNTER — Encounter: Payer: Self-pay | Admitting: Internal Medicine

## 2024-08-01 VITALS — BP 130/68 | HR 79 | Temp 98.2°F | Ht 67.0 in | Wt 274.2 lb

## 2024-08-01 DIAGNOSIS — I110 Hypertensive heart disease with heart failure: Secondary | ICD-10-CM

## 2024-08-01 DIAGNOSIS — G4733 Obstructive sleep apnea (adult) (pediatric): Secondary | ICD-10-CM | POA: Diagnosis not present

## 2024-08-01 DIAGNOSIS — Z23 Encounter for immunization: Secondary | ICD-10-CM | POA: Diagnosis not present

## 2024-08-01 DIAGNOSIS — I82533 Chronic embolism and thrombosis of popliteal vein, bilateral: Secondary | ICD-10-CM

## 2024-08-01 DIAGNOSIS — Z6841 Body Mass Index (BMI) 40.0 and over, adult: Secondary | ICD-10-CM | POA: Diagnosis not present

## 2024-08-01 DIAGNOSIS — I10 Essential (primary) hypertension: Secondary | ICD-10-CM

## 2024-08-01 DIAGNOSIS — G720 Drug-induced myopathy: Secondary | ICD-10-CM | POA: Diagnosis not present

## 2024-08-01 DIAGNOSIS — G8929 Other chronic pain: Secondary | ICD-10-CM | POA: Diagnosis not present

## 2024-08-01 DIAGNOSIS — I509 Heart failure, unspecified: Secondary | ICD-10-CM | POA: Diagnosis not present

## 2024-08-01 DIAGNOSIS — I2609 Other pulmonary embolism with acute cor pulmonale: Secondary | ICD-10-CM

## 2024-08-01 DIAGNOSIS — Z79891 Long term (current) use of opiate analgesic: Secondary | ICD-10-CM

## 2024-08-01 DIAGNOSIS — E7849 Other hyperlipidemia: Secondary | ICD-10-CM

## 2024-08-01 DIAGNOSIS — Z7901 Long term (current) use of anticoagulants: Secondary | ICD-10-CM

## 2024-08-01 DIAGNOSIS — T466X5A Adverse effect of antihyperlipidemic and antiarteriosclerotic drugs, initial encounter: Secondary | ICD-10-CM | POA: Diagnosis not present

## 2024-08-01 MED ORDER — RIVAROXABAN 20 MG PO TABS: 20.0000 mg | ORAL_TABLET | Freq: Every day | ORAL | 3 refills | Status: AC

## 2024-08-01 MED ORDER — OXYCODONE-ACETAMINOPHEN 10-325 MG PO TABS: 1.0000 | ORAL_TABLET | ORAL | 0 refills | Status: AC | PRN

## 2024-08-01 MED ORDER — FUROSEMIDE 20 MG PO TABS: 20.0000 mg | ORAL_TABLET | Freq: Every day | ORAL | 3 refills | Status: AC | PRN

## 2024-08-01 NOTE — Patient Instructions (Signed)
 Try using Head and Shoulders Clinical Strength shampoo or Selsun Blue Shampoo, use at least twice weekly for a month.

## 2024-08-02 LAB — LIPID PANEL
Chol/HDL Ratio: 3.6 ratio (ref 0.0–4.4)
Cholesterol, Total: 159 mg/dL (ref 100–199)
HDL: 44 mg/dL
LDL Chol Calc (NIH): 97 mg/dL (ref 0–99)
Triglycerides: 97 mg/dL (ref 0–149)
VLDL Cholesterol Cal: 18 mg/dL (ref 5–40)

## 2024-08-03 NOTE — Assessment & Plan Note (Signed)
 SABRA

## 2024-08-03 NOTE — Assessment & Plan Note (Signed)
 Doing well on zepbound  with signficant weight loss, still having some trouble with CPAP- asked her to call DME provider.

## 2024-08-03 NOTE — Assessment & Plan Note (Signed)
" °  Orders:   rivaroxaban  (XARELTO ) 20 MG TABS tablet; Take 1 tablet (20 mg total) by mouth daily with supper.  "

## 2024-08-03 NOTE — Assessment & Plan Note (Signed)
 Managed with Benicar . - Continue Benicar  for blood pressure management.

## 2024-08-03 NOTE — Progress Notes (Signed)
 " Subjective:  HPI: Chief Complaint  Patient presents with   Follow-up    Left thigh continues swollen.Rash-forehead.Occasional h/a's at night.    Discussed the use of AI scribe software for clinical note transcription with the patient, who gave verbal consent to proceed.  History of Present Illness Breanna Wilkins is a 64 year old female with sleep apnea who presents for medication management and CPAP equipment concerns.  Obstructive sleep apnea and cpap equipment issues - Ongoing issues with CPAP equipment, specifically lacking a new mask and hoses - Currently using an old CPAP machine and mask, which are cleaned regularly - Has not received new CPAP supplies and is unsure if equipment provider has correct information - Previous recommendation for BiPAP machine based on sleep study, but currently using CPAP with pressure setting of 18 - Sore throat, possibly due to snoring - No new CPAP supplies received  Weight management and associated symptoms - Actively managing weight and has lost weight in various areas of the body - Currently using Zepbound  for weight management - No significant side effects from Zepbound , but occasional headaches - Concerned about hair thinning and rough skin, discussed with oncology provider - Maintains protein intake and considering a multivitamin - No pain in the thighs despite weight loss  Medication management - On multiple medications including furosemide , Benicar , oxycodone , Xarelto , and Zepbound  - Requires refill for furosemide  and Xarelto  - Manages pain with oxycodone , taking as needed - Mindful of medication schedule  Hyperlipidemia and statin intolerance - History of cholesterol management issues - Previously tried Crestor  and Repatha , experienced myalgia symptoms - Has tried red yeast rice as a natural alternative - Continues weight loss efforts to aid cholesterol management  Dermatologic symptoms - Itching and sores on scalp,  attributed to reaction from a hair product - Considering use of medicated shampoo  Preventive health maintenance - Scheduled for mammogram on January 26th - Due for flu shot; last received in September of previous year - Declined Pap smear due to history of vaginal hysterectomy     Please see Assessment and Plan below for the status of her chronic medical problems.  Objective:  Physical Exam: Vitals:   08/01/24 1106  BP: 130/68  Pulse: 79  Temp: 98.2 F (36.8 C)  TempSrc: Oral  SpO2: 100%  Weight: 274 lb 3.2 oz (124.4 kg)  Height: 5' 7 (1.702 m)   Body mass index is 42.95 kg/m. Physical Exam Vitals and nursing note reviewed.  Constitutional:      Appearance: Normal appearance.  Cardiovascular:     Rate and Rhythm: Normal rate and regular rhythm.  Pulmonary:     Effort: Pulmonary effort is normal.     Breath sounds: Normal breath sounds.  Skin:    Comments: No significant rash or erythema of scalp,  some dandruff   Neurological:     Mental Status: She is alert.  Psychiatric:        Mood and Affect: Mood normal.     Results for orders placed or performed in visit on 08/01/24  Lipid Profile  Result Value Ref Range   Cholesterol, Total 159 100 - 199 mg/dL   Triglycerides 97 0 - 149 mg/dL   HDL 44 >60 mg/dL   VLDL Cholesterol Cal 18 5 - 40 mg/dL   LDL Chol Calc (NIH) 97 0 - 99 mg/dL   Chol/HDL Ratio 3.6 0.0 - 4.4 ratio    The ASCVD Risk score (Arnett DK, et al., 2019) failed to calculate  for the following reasons:   Risk score cannot be calculated because patient has a medical history suggesting prior/existing ASCVD   * - Cholesterol units were assumed  Assessment & Plan:  See Encounters Tab for problem based charting. Assessment and Plan  Assessment & Plan CHF NYHA class II (symptoms with moderately strenuous activities), unspecified failure chronicity, unspecified type (HCC) Chronic heart failure is well-managed. - Continue current  management. Orders:   furosemide  (LASIX ) 20 MG tablet; Take 1 tablet (20 mg total) by mouth daily as needed.  Long term (current) use of anticoagulants  Orders:   rivaroxaban  (XARELTO ) 20 MG TABS tablet; Take 1 tablet (20 mg total) by mouth daily with supper.  Chronic deep vein thrombosis (DVT) of both popliteal veins (HCC)  Orders:   rivaroxaban  (XARELTO ) 20 MG TABS tablet; Take 1 tablet (20 mg total) by mouth daily with supper.  Other chronic pulmonary embolism with acute cor pulmonale (HCC)  Orders:   rivaroxaban  (XARELTO ) 20 MG TABS tablet; Take 1 tablet (20 mg total) by mouth daily with supper.  Severe obstructive sleep apnea Doing well on zepbound  with signficant weight loss, still having some trouble with CPAP- asked her to call DME provider.    Essential hypertension, benign Managed with Benicar . - Continue Benicar  for blood pressure management.    Statin myopathy [G72.0, T46.6X5A] Previous intolerance to rosuvastatin  and Repatha . Weight loss may improve cholesterol levels. - Continue to monitor cholesterol levels with weight loss.    Other hyperlipidemia Previous intolerance to rosuvastatin  and Repatha . Weight loss may improve cholesterol levels. - Continue to monitor cholesterol levels with weight loss. Orders:   Lipid Profile  Encounter for immunization  Orders:   Flu vaccine HIGH DOSE PF(Fluzone Trivalent)  Morbid obesity with BMI of 45.0-49.9, adult (HCC) BMI of 45.0-49.9. BMI now down to 42.5.  Weight loss is ongoing with Zepbound . No significant side effects from Zepbound , though hair thinning may be related to weight loss. - Continue Zepbound  for weight management. - Encouraged continued weight loss efforts.    Other chronic pain Managed with oxycodone -acetaminophen . Pain is well-managed with current regimen. - Continue oxycodone -acetaminophen  as needed for pain management.    Long term (current) use of opiate analgesic     General health  maintenance Due for flu shot. No Pap smear due to previous vaginal hysterectomy. - No Pap smear needed due to previous vaginal hysterectomy.  Medications Ordered Meds ordered this encounter  Medications   furosemide  (LASIX ) 20 MG tablet    Sig: Take 1 tablet (20 mg total) by mouth daily as needed.    Dispense:  100 tablet    Refill:  3   rivaroxaban  (XARELTO ) 20 MG TABS tablet    Sig: Take 1 tablet (20 mg total) by mouth daily with supper.    Dispense:  100 tablet    Refill:  3   oxyCODONE -acetaminophen  (PERCOCET) 10-325 MG tablet    Sig: Take 1 tablet by mouth every 4 (four) hours as needed for pain.    Dispense:  130 tablet    Refill:  0    Rx 2/3   oxyCODONE -acetaminophen  (PERCOCET) 10-325 MG tablet    Sig: Take 1 tablet by mouth every 4 (four) hours as needed for pain.    Dispense:  130 tablet    Refill:  0    Rx 3/3   oxyCODONE -acetaminophen  (PERCOCET) 10-325 MG tablet    Sig: Take 1 tablet by mouth every 4 (four) hours as needed for pain.  Dispense:  130 tablet    Refill:  0    Rx 1/3   Other Orders Orders Placed This Encounter  Procedures   Flu vaccine HIGH DOSE PF(Fluzone Trivalent)   Lipid Profile   Follow Up: Return in about 3 months (around 10/30/2024) for HTN. "

## 2024-08-03 NOTE — Assessment & Plan Note (Signed)
 BMI of 45.0-49.9. BMI now down to 42.5.  Weight loss is ongoing with Zepbound . No significant side effects from Zepbound , though hair thinning may be related to weight loss. - Continue Zepbound  for weight management. - Encouraged continued weight loss efforts.

## 2024-08-03 NOTE — Assessment & Plan Note (Signed)
 Previous intolerance to rosuvastatin  and Repatha . Weight loss may improve cholesterol levels. - Continue to monitor cholesterol levels with weight loss. Orders:   Lipid Profile

## 2024-08-03 NOTE — Assessment & Plan Note (Signed)
 Managed with oxycodone -acetaminophen . Pain is well-managed with current regimen. - Continue oxycodone -acetaminophen  as needed for pain management.

## 2024-08-15 ENCOUNTER — Encounter

## 2024-08-18 ENCOUNTER — Other Ambulatory Visit: Payer: Self-pay | Admitting: Internal Medicine

## 2024-08-18 ENCOUNTER — Telehealth: Payer: Self-pay | Admitting: Internal Medicine

## 2024-08-18 DIAGNOSIS — G4733 Obstructive sleep apnea (adult) (pediatric): Secondary | ICD-10-CM

## 2024-08-18 NOTE — Telephone Encounter (Signed)
 Pt requesting a call back in regards to her Labs results.  Pt states she has not hear back since 08/01/2024 about her Cholesterol.

## 2024-08-18 NOTE — Telephone Encounter (Signed)
 I called.

## 2024-08-23 ENCOUNTER — Encounter

## 2024-08-31 ENCOUNTER — Encounter

## 2025-01-24 ENCOUNTER — Inpatient Hospital Stay: Admitting: Hematology and Oncology

## 2025-01-24 ENCOUNTER — Inpatient Hospital Stay
